# Patient Record
Sex: Male | Born: 1935 | Race: White | Hispanic: No | Smoking: Former smoker
Health system: Southern US, Community
[De-identification: ages and names within clinical notes are randomized; demographics above are authoritative.]

## PROBLEM LIST (undated history)

## (undated) DIAGNOSIS — I219 Acute myocardial infarction, unspecified: Secondary | ICD-10-CM

## (undated) DIAGNOSIS — J449 Chronic obstructive pulmonary disease, unspecified: Secondary | ICD-10-CM

## (undated) DIAGNOSIS — IMO0002 Reserved for concepts with insufficient information to code with codable children: Secondary | ICD-10-CM

## (undated) DIAGNOSIS — C61 Malignant neoplasm of prostate: Principal | ICD-10-CM

## (undated) DIAGNOSIS — Z950 Presence of cardiac pacemaker: Secondary | ICD-10-CM

## (undated) DIAGNOSIS — C801 Malignant (primary) neoplasm, unspecified: Secondary | ICD-10-CM

## (undated) DIAGNOSIS — I714 Abdominal aortic aneurysm, without rupture: Secondary | ICD-10-CM

## (undated) DIAGNOSIS — J189 Pneumonia, unspecified organism: Secondary | ICD-10-CM

## (undated) DIAGNOSIS — N189 Chronic kidney disease, unspecified: Secondary | ICD-10-CM

## (undated) DIAGNOSIS — C4490 Unspecified malignant neoplasm of skin, unspecified: Secondary | ICD-10-CM

## (undated) DIAGNOSIS — E785 Hyperlipidemia, unspecified: Secondary | ICD-10-CM

## (undated) DIAGNOSIS — I10 Essential (primary) hypertension: Secondary | ICD-10-CM

## (undated) DIAGNOSIS — I2510 Atherosclerotic heart disease of native coronary artery without angina pectoris: Secondary | ICD-10-CM

## (undated) DIAGNOSIS — C3490 Malignant neoplasm of unspecified part of unspecified bronchus or lung: Secondary | ICD-10-CM

## (undated) DIAGNOSIS — R0602 Shortness of breath: Secondary | ICD-10-CM

## (undated) DIAGNOSIS — K219 Gastro-esophageal reflux disease without esophagitis: Secondary | ICD-10-CM

## (undated) DIAGNOSIS — K5790 Diverticulosis of intestine, part unspecified, without perforation or abscess without bleeding: Secondary | ICD-10-CM

## (undated) DIAGNOSIS — Z923 Personal history of irradiation: Secondary | ICD-10-CM

## (undated) DIAGNOSIS — K649 Unspecified hemorrhoids: Secondary | ICD-10-CM

## (undated) DIAGNOSIS — I2699 Other pulmonary embolism without acute cor pulmonale: Secondary | ICD-10-CM

## (undated) HISTORY — PX: INSERT / REPLACE / REMOVE PACEMAKER: SUR710

## (undated) HISTORY — PX: CORONARY ANGIOPLASTY: SHX604

## (undated) HISTORY — DX: Malignant (primary) neoplasm, unspecified: C80.1

## (undated) HISTORY — DX: Atherosclerotic heart disease of native coronary artery without angina pectoris: I25.10

## (undated) HISTORY — DX: Diverticulosis of intestine, part unspecified, without perforation or abscess without bleeding: K57.90

## (undated) HISTORY — DX: Reserved for concepts with insufficient information to code with codable children: IMO0002

## (undated) HISTORY — PX: CORONARY STENT PLACEMENT: SHX1402

## (undated) HISTORY — DX: Abdominal aortic aneurysm, without rupture: I71.4

## (undated) HISTORY — DX: Hyperlipidemia, unspecified: E78.5

## (undated) HISTORY — DX: Unspecified hemorrhoids: K64.9

---

## 1971-06-14 HISTORY — PX: ELBOW SURGERY: SHX618

## 1993-06-13 DIAGNOSIS — I219 Acute myocardial infarction, unspecified: Secondary | ICD-10-CM

## 1993-06-13 HISTORY — DX: Acute myocardial infarction, unspecified: I21.9

## 2000-02-26 ENCOUNTER — Emergency Department (HOSPITAL_COMMUNITY): Admission: EM | Admit: 2000-02-26 | Discharge: 2000-02-26 | Payer: Self-pay | Admitting: Emergency Medicine

## 2000-02-26 ENCOUNTER — Encounter: Payer: Self-pay | Admitting: Emergency Medicine

## 2003-06-14 DIAGNOSIS — C61 Malignant neoplasm of prostate: Secondary | ICD-10-CM

## 2003-06-14 HISTORY — DX: Malignant neoplasm of prostate: C61

## 2004-01-23 ENCOUNTER — Ambulatory Visit (HOSPITAL_COMMUNITY): Admission: RE | Admit: 2004-01-23 | Discharge: 2004-01-23 | Payer: Self-pay | Admitting: Urology

## 2004-02-02 ENCOUNTER — Ambulatory Visit: Admission: RE | Admit: 2004-02-02 | Discharge: 2004-05-02 | Payer: Self-pay | Admitting: Radiation Oncology

## 2004-04-02 ENCOUNTER — Encounter: Admission: RE | Admit: 2004-04-02 | Discharge: 2004-04-02 | Payer: Self-pay | Admitting: Urology

## 2004-04-27 ENCOUNTER — Ambulatory Visit: Payer: Self-pay | Admitting: Family Medicine

## 2004-05-04 ENCOUNTER — Ambulatory Visit (HOSPITAL_COMMUNITY): Admission: RE | Admit: 2004-05-04 | Discharge: 2004-05-04 | Payer: Self-pay | Admitting: Urology

## 2004-05-04 ENCOUNTER — Ambulatory Visit (HOSPITAL_BASED_OUTPATIENT_CLINIC_OR_DEPARTMENT_OTHER): Admission: RE | Admit: 2004-05-04 | Discharge: 2004-05-04 | Payer: Self-pay | Admitting: Urology

## 2004-05-04 HISTORY — PX: RADIOACTIVE SEED IMPLANT: SHX5150

## 2004-05-24 ENCOUNTER — Ambulatory Visit: Admission: RE | Admit: 2004-05-24 | Discharge: 2004-08-22 | Payer: Self-pay | Admitting: Radiation Oncology

## 2004-06-13 HISTORY — PX: PTCA: SHX146

## 2005-01-03 ENCOUNTER — Ambulatory Visit: Payer: Self-pay | Admitting: Family Medicine

## 2005-01-11 ENCOUNTER — Ambulatory Visit: Payer: Self-pay | Admitting: Family Medicine

## 2005-01-14 ENCOUNTER — Encounter: Admission: RE | Admit: 2005-01-14 | Discharge: 2005-01-14 | Payer: Self-pay | Admitting: Family Medicine

## 2005-01-27 ENCOUNTER — Ambulatory Visit: Payer: Self-pay | Admitting: Internal Medicine

## 2005-01-28 ENCOUNTER — Ambulatory Visit: Payer: Self-pay | Admitting: Cardiology

## 2005-01-31 ENCOUNTER — Inpatient Hospital Stay (HOSPITAL_BASED_OUTPATIENT_CLINIC_OR_DEPARTMENT_OTHER): Admission: RE | Admit: 2005-01-31 | Discharge: 2005-01-31 | Payer: Self-pay | Admitting: Cardiology

## 2005-01-31 ENCOUNTER — Inpatient Hospital Stay (HOSPITAL_COMMUNITY): Admission: EM | Admit: 2005-01-31 | Discharge: 2005-02-02 | Payer: Self-pay | Admitting: Cardiology

## 2005-02-01 ENCOUNTER — Ambulatory Visit: Payer: Self-pay | Admitting: Cardiology

## 2005-02-22 ENCOUNTER — Ambulatory Visit: Payer: Self-pay | Admitting: *Deleted

## 2005-04-14 ENCOUNTER — Ambulatory Visit: Payer: Self-pay | Admitting: Family Medicine

## 2005-08-12 ENCOUNTER — Ambulatory Visit: Payer: Self-pay | Admitting: Family Medicine

## 2005-09-19 ENCOUNTER — Ambulatory Visit: Payer: Self-pay | Admitting: Family Medicine

## 2005-10-10 ENCOUNTER — Ambulatory Visit: Payer: Self-pay | Admitting: Family Medicine

## 2006-01-16 ENCOUNTER — Ambulatory Visit: Payer: Self-pay | Admitting: Family Medicine

## 2006-01-16 LAB — CONVERTED CEMR LAB: PSA: 0.42 ng/mL

## 2006-01-23 ENCOUNTER — Ambulatory Visit: Payer: Self-pay | Admitting: Family Medicine

## 2006-04-07 ENCOUNTER — Ambulatory Visit: Payer: Self-pay | Admitting: Family Medicine

## 2006-12-25 ENCOUNTER — Encounter: Payer: Self-pay | Admitting: Family Medicine

## 2006-12-25 DIAGNOSIS — H905 Unspecified sensorineural hearing loss: Secondary | ICD-10-CM

## 2006-12-25 DIAGNOSIS — E785 Hyperlipidemia, unspecified: Secondary | ICD-10-CM

## 2006-12-25 DIAGNOSIS — I10 Essential (primary) hypertension: Secondary | ICD-10-CM

## 2006-12-25 DIAGNOSIS — I2510 Atherosclerotic heart disease of native coronary artery without angina pectoris: Secondary | ICD-10-CM

## 2007-01-11 ENCOUNTER — Ambulatory Visit: Payer: Self-pay | Admitting: Family Medicine

## 2007-01-11 LAB — CONVERTED CEMR LAB
BUN: 14 mg/dL (ref 6–23)
Bilirubin, Direct: 0.1 mg/dL (ref 0.0–0.3)
CO2: 28 meq/L (ref 19–32)
Calcium: 9.6 mg/dL (ref 8.4–10.5)
Chloride: 106 meq/L (ref 96–112)
Cholesterol: 146 mg/dL (ref 0–200)
Eosinophils Absolute: 0.3 10*3/uL (ref 0.0–0.6)
HCT: 39.3 % (ref 39.0–52.0)
Hemoglobin: 13.5 g/dL (ref 13.0–17.0)
MCHC: 34.3 g/dL (ref 30.0–36.0)
Monocytes Absolute: 0.5 10*3/uL (ref 0.2–0.7)
Monocytes Relative: 10.9 % (ref 3.0–11.0)
Neutro Abs: 2.2 10*3/uL (ref 1.4–7.7)
PSA: 0.42 ng/mL (ref 0.10–4.00)
Potassium: 4.9 meq/L (ref 3.5–5.1)
RDW: 13 % (ref 11.5–14.6)
Sodium: 140 meq/L (ref 135–145)
TSH: 2.93 microintl units/mL (ref 0.35–5.50)
Total Bilirubin: 1.6 mg/dL — ABNORMAL HIGH (ref 0.3–1.2)
Total Protein: 6.7 g/dL (ref 6.0–8.3)
WBC: 4.3 10*3/uL — ABNORMAL LOW (ref 4.5–10.5)

## 2007-01-18 ENCOUNTER — Ambulatory Visit: Payer: Self-pay | Admitting: Family Medicine

## 2007-01-18 DIAGNOSIS — H900 Conductive hearing loss, bilateral: Secondary | ICD-10-CM

## 2007-05-25 ENCOUNTER — Ambulatory Visit: Payer: Self-pay | Admitting: Family Medicine

## 2007-08-17 ENCOUNTER — Telehealth (INDEPENDENT_AMBULATORY_CARE_PROVIDER_SITE_OTHER): Payer: Self-pay | Admitting: *Deleted

## 2007-09-03 ENCOUNTER — Telehealth: Payer: Self-pay | Admitting: Family Medicine

## 2007-09-03 ENCOUNTER — Ambulatory Visit: Payer: Self-pay | Admitting: Internal Medicine

## 2007-09-18 ENCOUNTER — Encounter: Payer: Self-pay | Admitting: Family Medicine

## 2007-09-18 ENCOUNTER — Ambulatory Visit: Payer: Self-pay | Admitting: Internal Medicine

## 2007-12-31 ENCOUNTER — Encounter: Payer: Self-pay | Admitting: Internal Medicine

## 2007-12-31 ENCOUNTER — Telehealth: Payer: Self-pay | Admitting: Internal Medicine

## 2008-01-30 ENCOUNTER — Encounter: Payer: Self-pay | Admitting: Family Medicine

## 2008-01-30 ENCOUNTER — Ambulatory Visit: Payer: Self-pay | Admitting: Family Medicine

## 2008-02-11 DIAGNOSIS — L570 Actinic keratosis: Secondary | ICD-10-CM | POA: Insufficient documentation

## 2008-02-27 ENCOUNTER — Ambulatory Visit: Payer: Self-pay | Admitting: Family Medicine

## 2008-02-27 LAB — CONVERTED CEMR LAB
ALT: 20 units/L (ref 0–53)
Bilirubin Urine: NEGATIVE
Bilirubin, Direct: 0.2 mg/dL (ref 0.0–0.3)
CO2: 29 meq/L (ref 19–32)
Cholesterol: 143 mg/dL (ref 0–200)
Creatinine, Ser: 1 mg/dL (ref 0.4–1.5)
Eosinophils Absolute: 0.6 10*3/uL (ref 0.0–0.7)
GFR calc non Af Amer: 79 mL/min
Glucose, Bld: 102 mg/dL — ABNORMAL HIGH (ref 70–99)
Glucose, Urine, Semiquant: NEGATIVE
Hemoglobin: 14 g/dL (ref 13.0–17.0)
LDL Cholesterol: 87 mg/dL (ref 0–99)
Lymphocytes Relative: 22 % (ref 12.0–46.0)
MCV: 94.5 fL (ref 78.0–100.0)
Neutrophils Relative %: 58.4 % (ref 43.0–77.0)
Nitrite: NEGATIVE
Platelets: 165 10*3/uL (ref 150–400)
Potassium: 4.1 meq/L (ref 3.5–5.1)
Protein, U semiquant: NEGATIVE
RBC: 4.38 M/uL (ref 4.22–5.81)
RDW: 13.2 % (ref 11.5–14.6)
Sodium: 142 meq/L (ref 135–145)
Specific Gravity, Urine: 1.025
TSH: 2.74 microintl units/mL (ref 0.35–5.50)
Total Bilirubin: 1.3 mg/dL — ABNORMAL HIGH (ref 0.3–1.2)
WBC: 6.1 10*3/uL (ref 4.5–10.5)

## 2008-03-05 ENCOUNTER — Ambulatory Visit: Payer: Self-pay | Admitting: Family Medicine

## 2008-03-05 DIAGNOSIS — Z8546 Personal history of malignant neoplasm of prostate: Secondary | ICD-10-CM

## 2008-03-31 ENCOUNTER — Telehealth: Payer: Self-pay | Admitting: Family Medicine

## 2008-04-13 DIAGNOSIS — I714 Abdominal aortic aneurysm, without rupture, unspecified: Secondary | ICD-10-CM

## 2008-04-13 HISTORY — DX: Abdominal aortic aneurysm, without rupture: I71.4

## 2008-04-13 HISTORY — DX: Abdominal aortic aneurysm, without rupture, unspecified: I71.40

## 2008-05-05 ENCOUNTER — Ambulatory Visit: Payer: Self-pay | Admitting: Family Medicine

## 2008-05-12 ENCOUNTER — Encounter: Admission: RE | Admit: 2008-05-12 | Discharge: 2008-05-12 | Payer: Self-pay | Admitting: Family Medicine

## 2008-05-14 ENCOUNTER — Ambulatory Visit: Payer: Self-pay | Admitting: Family Medicine

## 2008-05-14 DIAGNOSIS — I714 Abdominal aortic aneurysm, without rupture, unspecified: Secondary | ICD-10-CM | POA: Insufficient documentation

## 2008-09-22 ENCOUNTER — Telehealth: Payer: Self-pay | Admitting: Family Medicine

## 2008-09-26 ENCOUNTER — Telehealth: Payer: Self-pay | Admitting: Family Medicine

## 2008-09-30 ENCOUNTER — Telehealth: Payer: Self-pay | Admitting: Family Medicine

## 2009-03-09 ENCOUNTER — Ambulatory Visit: Payer: Self-pay | Admitting: Family Medicine

## 2009-03-09 LAB — CONVERTED CEMR LAB
AST: 20 units/L (ref 0–37)
Basophils Absolute: 0 10*3/uL (ref 0.0–0.1)
Basophils Relative: 0.5 % (ref 0.0–3.0)
Bilirubin Urine: NEGATIVE
Blood in Urine, dipstick: NEGATIVE
CO2: 32 meq/L (ref 19–32)
Calcium: 9.7 mg/dL (ref 8.4–10.5)
Chloride: 107 meq/L (ref 96–112)
Creatinine, Ser: 0.9 mg/dL (ref 0.4–1.5)
Eosinophils Relative: 5.2 % — ABNORMAL HIGH (ref 0.0–5.0)
GFR calc non Af Amer: 88.43 mL/min (ref >60)
Glucose, Bld: 102 mg/dL — ABNORMAL HIGH (ref 70–99)
LDL Cholesterol: 103 mg/dL — ABNORMAL HIGH (ref 0–99)
MCHC: 34.2 g/dL (ref 30.0–36.0)
MCV: 93.7 fL (ref 78.0–100.0)
Monocytes Absolute: 0.5 10*3/uL (ref 0.1–1.0)
Monocytes Relative: 10.7 % (ref 3.0–12.0)
Neutro Abs: 2.7 10*3/uL (ref 1.4–7.7)
Nitrite: NEGATIVE
PSA: 1.14 ng/mL (ref 0.10–4.00)
Platelets: 152 10*3/uL (ref 150.0–400.0)
Potassium: 5.1 meq/L (ref 3.5–5.1)
Protein, U semiquant: NEGATIVE
Specific Gravity, Urine: 1.025
Urobilinogen, UA: 1
pH: 7

## 2009-03-16 ENCOUNTER — Ambulatory Visit: Payer: Self-pay | Admitting: Family Medicine

## 2010-03-19 ENCOUNTER — Ambulatory Visit: Payer: Self-pay | Admitting: Family Medicine

## 2010-03-19 ENCOUNTER — Encounter: Payer: Self-pay | Admitting: Family Medicine

## 2010-03-19 LAB — CONVERTED CEMR LAB
Bilirubin Urine: NEGATIVE
Glucose, Urine, Semiquant: NEGATIVE
Nitrite: NEGATIVE
Protein, U semiquant: NEGATIVE
WBC Urine, dipstick: NEGATIVE

## 2010-03-22 LAB — CONVERTED CEMR LAB
ALT: 23 units/L (ref 0–53)
Alkaline Phosphatase: 66 units/L (ref 39–117)
Basophils Relative: 0.6 % (ref 0.0–3.0)
CO2: 30 meq/L (ref 19–32)
Calcium: 9.3 mg/dL (ref 8.4–10.5)
Chloride: 106 meq/L (ref 96–112)
Creatinine, Ser: 0.8 mg/dL (ref 0.4–1.5)
Eosinophils Absolute: 0.6 10*3/uL (ref 0.0–0.7)
Eosinophils Relative: 13.1 % — ABNORMAL HIGH (ref 0.0–5.0)
GFR calc non Af Amer: 102.49 mL/min (ref >60)
Glucose, Bld: 90 mg/dL (ref 70–99)
HDL: 36.4 mg/dL — ABNORMAL LOW (ref >39.00)
Neutro Abs: 2.1 10*3/uL (ref 1.4–7.7)
Potassium: 4.2 meq/L (ref 3.5–5.1)
RBC: 4.12 M/uL — ABNORMAL LOW (ref 4.22–5.81)
RDW: 14.1 % (ref 11.5–14.6)
TSH: 2.77 microintl units/mL (ref 0.35–5.50)
Total Bilirubin: 1.2 mg/dL (ref 0.3–1.2)
Total CHOL/HDL Ratio: 4
Triglycerides: 85 mg/dL (ref 0.0–149.0)

## 2010-04-01 ENCOUNTER — Encounter: Payer: Self-pay | Admitting: Family Medicine

## 2010-04-02 ENCOUNTER — Encounter: Payer: Self-pay | Admitting: Family Medicine

## 2010-04-02 ENCOUNTER — Ambulatory Visit: Payer: Self-pay

## 2010-04-06 ENCOUNTER — Ambulatory Visit: Payer: Self-pay | Admitting: Cardiology

## 2010-04-06 ENCOUNTER — Encounter: Payer: Self-pay | Admitting: Cardiology

## 2010-04-19 ENCOUNTER — Ambulatory Visit: Payer: Self-pay

## 2010-04-19 ENCOUNTER — Encounter: Payer: Self-pay | Admitting: Cardiology

## 2010-04-19 ENCOUNTER — Ambulatory Visit (HOSPITAL_COMMUNITY): Admission: RE | Admit: 2010-04-19 | Discharge: 2010-04-19 | Payer: Self-pay | Admitting: Cardiology

## 2010-05-09 DIAGNOSIS — J45901 Unspecified asthma with (acute) exacerbation: Secondary | ICD-10-CM

## 2010-05-11 ENCOUNTER — Telehealth: Payer: Self-pay | Admitting: Family Medicine

## 2010-05-20 ENCOUNTER — Ambulatory Visit: Payer: Self-pay | Admitting: Family Medicine

## 2010-06-03 ENCOUNTER — Telehealth: Payer: Self-pay | Admitting: Family Medicine

## 2010-06-04 ENCOUNTER — Ambulatory Visit: Payer: Self-pay | Admitting: Family Medicine

## 2010-06-08 ENCOUNTER — Ambulatory Visit
Admission: RE | Admit: 2010-06-08 | Discharge: 2010-06-08 | Payer: Self-pay | Source: Home / Self Care | Attending: Internal Medicine | Admitting: Internal Medicine

## 2010-06-08 ENCOUNTER — Telehealth: Payer: Self-pay

## 2010-06-29 ENCOUNTER — Ambulatory Visit
Admission: RE | Admit: 2010-06-29 | Discharge: 2010-06-29 | Payer: Self-pay | Source: Home / Self Care | Attending: Family Medicine | Admitting: Family Medicine

## 2010-06-29 ENCOUNTER — Other Ambulatory Visit: Payer: Self-pay | Admitting: Family Medicine

## 2010-06-29 DIAGNOSIS — K810 Acute cholecystitis: Secondary | ICD-10-CM | POA: Insufficient documentation

## 2010-06-29 LAB — BASIC METABOLIC PANEL
BUN: 18 mg/dL (ref 6–23)
CO2: 29 mEq/L (ref 19–32)
Calcium: 9.4 mg/dL (ref 8.4–10.5)
Chloride: 103 mEq/L (ref 96–112)
Creatinine, Ser: 0.9 mg/dL (ref 0.4–1.5)
GFR: 88.1 mL/min (ref >60.00)
Glucose, Bld: 124 mg/dL — ABNORMAL HIGH (ref 70–99)
Potassium: 4.8 mEq/L (ref 3.5–5.1)
Sodium: 139 mEq/L (ref 135–145)

## 2010-06-29 LAB — CBC WITH DIFFERENTIAL/PLATELET
Basophils Absolute: 0 10*3/uL (ref 0.0–0.1)
Basophils Relative: 0.3 % (ref 0.0–3.0)
Eosinophils Absolute: 0.2 10*3/uL (ref 0.0–0.7)
Eosinophils Relative: 1.8 % (ref 0.0–5.0)
HCT: 40.3 % (ref 39.0–52.0)
Hemoglobin: 14 g/dL (ref 13.0–17.0)
Lymphocytes Relative: 12.2 % (ref 12.0–46.0)
Lymphs Abs: 1.2 10*3/uL (ref 0.7–4.0)
MCHC: 34.8 g/dL (ref 30.0–36.0)
MCV: 93.3 fl (ref 78.0–100.0)
Monocytes Absolute: 0.9 10*3/uL (ref 0.1–1.0)
Monocytes Relative: 9.5 % (ref 3.0–12.0)
Neutro Abs: 7.4 10*3/uL (ref 1.4–7.7)
Neutrophils Relative %: 76.2 % (ref 43.0–77.0)
Platelets: 169 10*3/uL (ref 150.0–400.0)
RBC: 4.31 Mil/uL (ref 4.22–5.81)
RDW: 14.3 % (ref 11.5–14.6)
WBC: 9.7 10*3/uL (ref 4.5–10.5)

## 2010-06-29 LAB — HEPATIC FUNCTION PANEL
ALT: 23 U/L (ref 0–53)
AST: 21 U/L (ref 0–37)
Albumin: 4 g/dL (ref 3.5–5.2)
Alkaline Phosphatase: 75 U/L (ref 39–117)
Bilirubin, Direct: 0.3 mg/dL (ref 0.0–0.3)
Total Bilirubin: 1.4 mg/dL — ABNORMAL HIGH (ref 0.3–1.2)
Total Protein: 6.8 g/dL (ref 6.0–8.3)

## 2010-06-29 LAB — AMYLASE: Amylase: 136 U/L — ABNORMAL HIGH (ref 27–131)

## 2010-06-29 LAB — LIPASE: Lipase: 62 U/L — ABNORMAL HIGH (ref 11.0–59.0)

## 2010-07-01 ENCOUNTER — Encounter
Admission: RE | Admit: 2010-07-01 | Discharge: 2010-07-01 | Payer: Self-pay | Source: Home / Self Care | Attending: Surgery | Admitting: Surgery

## 2010-07-06 ENCOUNTER — Other Ambulatory Visit (HOSPITAL_COMMUNITY): Payer: Self-pay | Admitting: Surgery

## 2010-07-06 DIAGNOSIS — R1011 Right upper quadrant pain: Secondary | ICD-10-CM

## 2010-07-06 DIAGNOSIS — R112 Nausea with vomiting, unspecified: Secondary | ICD-10-CM

## 2010-07-06 DIAGNOSIS — K8020 Calculus of gallbladder without cholecystitis without obstruction: Secondary | ICD-10-CM

## 2010-07-13 NOTE — Assessment & Plan Note (Signed)
Summary: emp/pt fasting/cjr   Vital Signs:  Patient profile:   75 year old male Height:      70.75 inches Weight:      218 pounds BMI:     30.73 Temp:     97.9 degrees F oral BP sitting:   160 / 92  (right arm) Cuff size:   regular  Vitals Entered By: Kathrynn Speed CMA (March 19, 2010 8:24 AM) CC: cpx, fasting, src Is Patient Diabetic? No   CC:  cpx, fasting, and src.  History of Present Illness: Corrin is a 75 year old male, nonsmoker single, who comes in today for a Medicare wellness exam.  Because coronary disease, hypertension, hyperlipidemia, and history of prostate cancer......... surgical removal 5 years ago., continues to be asymptomatic....... hearing loss, and a history of a 3.5-cm distal abdominal aortic aneurysm.  He states he feels well.  No complaints except his exercise tolerance is not as good as he once a day.  His friends tell them there.  All taking testosterone and he would like some testosterone shots also explained to him because his history of prostate cancer.  He should not take testosterone injections.  Also, because of his history of coronary disease.  I think it would be worthwhile to have a cardiac evaluation.  This year, although he has no chest pain or shortness of breath with exertion.  He takes Zestoretic 20 to 12.5 daily BP at home 125/70.  He takes Zocor 80 mg nightly along with aspirin tablet.  Would check lipids today.  He stopped his Plavix last year and is taken an aspirin tablet daily.  He gets routine eye care, dental plates, colonoscopy, and GI, tetanus, 2005, Pneumovax 2010, shingles 2009. Here for Medicare AWV:  1.   Risk factors based on Past M, S, F history:.Marland KitchenMarland KitchenMarland Kitchenreviewed.  No changes 2.   Physical Activities: walks daily plays golf 3 times a week 3.   Depression/mood: good mood.  No depression 4.   Hearing: significant hearing loss, declines evaluation 5.   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   Fall Risk: we did  none identified 7.   Home Safety: reviewed.  No guns in the home 8.   Height, weight, &visual acuity: 9.   Counseling: height weight, normal.  Vision normaladvised to continue current treatment program 10.   Labs ordered based on risk factors: done today 11.           Referral Coordination...cardiac evaluation because of decreased exercise tolerance 12.           Care Plan....reviewed all medications 13.            Cognitive Assessment .Marland Kitchenoriented x 3   Preventive Screening-Counseling & Management  Alcohol-Tobacco     Smoking Status: quit     Year Quit: 95'[     Passive Smoke Exposure: no  Current Medications (verified): 1)  Plavix 75 Mg  Tabs (Clopidogrel Bisulfate) .[name].. 1 Qam 2)  Nitrostat 0.4 Mg  Subl (Nitroglycerin) .... As Needed 3)  Mvi 4)  Fish Oil 5)  Zestoretic 20-12.5 Mg Tabs (Lisinopril-Hydrochlorothiazide) .... Take 1 Tablet By Mouth Every Morning 6)  Viagra 100 Mg Tabs (Sildenafil Citrate) .... Take One Hour Prior To Intercourse 7)  Zocor 80 Mg Tabs (Simvastatin) .[name].. 1 Tab @ Bedtime  Allergies (verified): No Known Drug Allergies  Past History:  Past medical, surgical, family and social histories (including risk factors) reviewed, and no changes noted (except as noted below).  Past Medical History: Reviewed history from 03/05/2008 and no changes required. Coronary artery disease Hyperlipidemia Hypertension HEARING LOSS Prostate cancer, hx of  Past Surgical History: Reviewed history from 12/25/2006 and no changes required. PROSTATE CANCER-05' PTCA/stent-06'  Family History: Reviewed history and no changes required.  Social History: Reviewed history from 03/05/2008 and no changes required. Retired Former Smoker Alcohol use-no Drug use-no Regular exercise-yes  Review of Systems      See HPI          Physical Exam  General:  Well-developed,well-nourished,in no acute distress; alert,appropriate and cooperative throughout examination Head:   Normocephalic and atraumatic without obvious abnormalities. No apparent alopecia or balding. Eyes:  No corneal or conjunctival inflammation noted. EOMI. Perrla. Funduscopic exam benign, without hemorrhages, exudates or papilledema. Vision grossly normal. Ears:  External ear exam shows no significant lesions or deformities.  Otoscopic examination reveals clear canals, tympanic membranes are intact bilaterally without bulging, retraction, inflammation or discharge. Hearing is grossly normal bilaterally. Nose:  External nasal examination shows no deformity or inflammation. Nasal mucosa are pink and moist without lesions or exudates. Mouth:  Oral mucosa and oropharynx without lesions or exudates.  Teeth in good repair. Neck:  No deformities, masses, or tenderness noted. Chest Wall:  No deformities, masses, tenderness or gynecomastia noted. Breasts:  No masses or gynecomastia noted Lungs:  Normal respiratory effort, chest expands symmetrically. Lungs are clear to auscultation, no crackles or wheezes. Heart:  Normal rate and regular rhythm. S1 and S2 normal without gallop, murmur, click, rub or other extra sounds. Abdomen:  Bowel sounds positive,abdomen soft and non-tender without masses, organomegaly or hernias noted..........no audible bruit Rectal:  No external abnormalities noted. Normal sphincter tone. No rectal masses or tenderness. Genitalia:  Testes bilaterally descended without nodularity, tenderness or masses. No scrotal masses or lesions. No penis lesions or urethral discharge. Prostate:  surgically removed Msk:  No deformity or scoliosis noted of thoracic or lumbar spine.   Pulses:  R and L carotid,radial,femoral,dorsalis pedis and posterior tibial pulses are full and equal bilaterally Extremities:  No clubbing, cyanosis, edema, or deformity noted with normal full range of motion of all joints.   Neurologic:  No cranial nerve deficits noted. Station and gait are normal. Plantar reflexes are  down-going bilaterally. DTRs are symmetrical throughout. Sensory, motor and coordinative functions appear intact. Skin:  Intact without suspicious lesions or rashes Cervical Nodes:  No lymphadenopathy noted Axillary Nodes:  No palpable lymphadenopathy Inguinal Nodes:  No significant adenopathy Psych:  Cognition and judgment appear intact. Alert and cooperative with normal attention span and concentration. No apparent delusions, illusions, hallucinations   Impression & Recommendations:  Problem # 1:  ABDOMINAL AORTIC ANEURYSM (ICD-441.4) Assessment Unchanged  Orders: EKG w/ Interpretation (93000) Venipuncture (06301) Prescription Created Electronically [phone]) Medicare -1st Annual Wellness Visit [phone]) Urinalysis-dipstick only (Medicare patient) (73220UR) Specimen Handling (42706) Radiology Referral (Radiology) TLB-Lipid Panel (80061-LIPID) TLB-BMP (Basic Metabolic Panel-BMET) (80048-METABOL) TLB-CBC Platelet - w/Differential (85025-CBCD) TLB-Hepatic/Liver Function Pnl (80076-HEPATIC) TLB-TSH (Thyroid Stimulating Hormone) (84443-TSH) TLB-PSA (Prostate Specific Antigen) (84153-PSA)  Problem # 2:  PROSTATE CANCER, HX OF (ICD-V10.46) Assessment: Improved  Orders: Venipuncture (23762) Prescription Created Electronically [phone]) Medicare -1st Annual Wellness Visit [phone]) Urinalysis-dipstick only (Medicare patient) (73710GY) TLB-Lipid Panel (80061-LIPID) TLB-BMP (Basic Metabolic Panel-BMET) (80048-METABOL) TLB-CBC Platelet - w/Differential (85025-CBCD) TLB-Hepatic/Liver Function Pnl (80076-HEPATIC) TLB-TSH (Thyroid Stimulating Hormone) (84443-TSH) TLB-PSA (Prostate Specific Antigen) (84153-PSA)  Problem # 3:  LOSS, CONDUCTIVE HEARING, BILATERAL (ICD-389.06) Assessment: Deteriorated  Orders: Venipuncture (69485) Prescription Created Electronically [phone]) Medicare -1st Annual Wellness  Visit [phone]) Urinalysis-dipstick only (Medicare patient) (86761PJ) Specimen Handling  (99000) TLB-Lipid Panel (80061-LIPID) TLB-BMP (Basic Metabolic Panel-BMET) (80048-METABOL) TLB-CBC Platelet - w/Differential (85025-CBCD) TLB-Hepatic/Liver Function Pnl (80076-HEPATIC) TLB-TSH (Thyroid Stimulating Hormone) (84443-TSH) TLB-PSA (Prostate Specific Antigen) (84153-PSA)  Problem # 4:  HYPERTENSION (ICD-401.9) Assessment: Improved  His updated medication list for this problem includes:    Zestoretic 20-12.5 Mg Tabs (Lisinopril-hydrochlorothiazide) .[name]... Take 1 tablet by mouth every morning  Orders: EKG w/ Interpretation (93000)  His updated medication list for this problem includes:    Zestoretic 20-12.5 Mg Tabs (Lisinopril-hydrochlorothiazide) .[name]... Take 1 tablet by mouth every morning  Problem # 5:  HYPERLIPIDEMIA (ICD-272.4) Assessment: Improved  His updated medication list for this problem includes:    Zocor 80 Mg Tabs (Simvastatin) .[name]... 1 tab @ bedtime  Orders: Venipuncture (09326) Prescription Created Electronically [phone]) Medicare -1st Annual Wellness Visit [phone]) Urinalysis-dipstick only (Medicare patient) (33825KN) Specimen Handling (39767) TLB-Lipid Panel (80061-LIPID) TLB-BMP (Basic Metabolic Panel-BMET) (80048-METABOL) TLB-CBC Platelet - w/Differential (85025-CBCD) TLB-Hepatic/Liver Function Pnl (80076-HEPATIC) TLB-TSH (Thyroid Stimulating Hormone) (84443-TSH) TLB-PSA (Prostate Specific Antigen) (84153-PSA)  Problem # 6:  CORONARY ARTERY DISEASE (ICD-414.00) Assessment: Unchanged  The following medications were removed from the medication list:    Plavix 75 Mg Tabs (Clopidogrel bisulfate) .[name]... 1 qam His updated medication list for this problem includes:    Nitrostat 0.4 Mg Subl (Nitroglycerin) .[name]... As needed    Zestoretic 20-12.5 Mg Tabs (Lisinopril-hydrochlorothiazide) .[name]... Take 1 tablet by mouth every morning  Orders: EKG w/ Interpretation (93000)  His updated medication list for this problem includes:    Plavix 75 Mg Tabs  (Clopidogrel bisulfate) .[name]... 1 qam    Nitrostat 0.4 Mg Subl (Nitroglycerin) .[name]... As needed    Zestoretic 20-12.5 Mg Tabs (Lisinopril-hydrochlorothiazide) .[name]... Take 1 tablet by mouth every morning  Complete Medication List: 1)  Nitrostat 0.4 Mg Subl (Nitroglycerin) .... As needed 2)  Mvi  3)  Fish Oil  4)  Zestoretic 20-12.5 Mg Tabs (Lisinopril-hydrochlorothiazide) .... Take 1 tablet by mouth every morning 5)  Zocor 80 Mg Tabs (Simvastatin) .[name].. 1 tab @ bedtime  Other Orders: Cardiology Referral (Cardiology)  Patient Instructions: 1)  we will get to set up for cardiac evaluation. 2)  Because of your history of prostate cancer.  I would not recommend he take any testosterone supplements. 3)  We will also get to set up for an ultrasound of your to check the aneurysm 4)  Please schedule a follow-up appointment in 1 year. 5)  It is important that you exercise regularly at least 20 minutes 5 times a week. If you develop chest pain, have severe difficulty breathing, or feel very tired , stop exercising immediately and seek medical attention. 6)  Take an Aspirin every day. Prescriptions: ZOCOR 80 MG TABS (SIMVASTATIN) 1 tab @ bedtime  #100 x 3   Entered and Authorized by:   Roderick Pee MD   Signed by:   Roderick Pee MD on 03/19/2010   Method used:   Faxed to ...       [facility]order)             , [place]         Ph:        Fax: [phone]   RxID:   [id number] ZESTORETIC 20-12.5 MG TABS (LISINOPRIL-HYDROCHLOROTHIAZIDE) Take 1 tablet by mouth every morning  #100 x 3   Entered and Authorized by:   Roderick Pee MD   Signed by:   Tinnie Gens  Shawnie Dapper MD on 03/19/2010   Method used:   Faxed to ...       [facility] (mail[facility]order)             , [place]         Ph:        Fax: [phone]   RxID:   [id number] NITROSTAT 0.4 MG  SUBL (NITROGLYCERIN) as needed  #25 x 1   Entered and Authorized by:   Roderick Pee  MD   Signed by:   Roderick Pee MD on 03/19/2010   Method used:   Faxed to ...       [facility]mail[facility]order)             , [place]         Ph:        Fax: [phone]   RxID:   [id number]      Laboratory Results   Urine Tests    Routine Urinalysis   Color: yellow Appearance: Clear Glucose: negative   (Normal Range: Negative) Bilirubin: negative   (Normal Range: Negative) Ketone: negative   (Normal Range: Negative) Spec. Gravity: 1.020   (Normal Range: 1.003-1.035) Blood: negative   (Normal Range: Negative) pH: 6.5   (Normal Range: 5.0-8.0) Protein: negative   (Normal Range: Negative) Urobilinogen: 0.2   (Normal Range: 0-1) Nitrite: negative   (Normal Range: Negative) Leukocyte Esterace: negative   (Normal Range: Negative)    Comments: Rita Ohara  March 19, 2010 10:56 AM

## 2010-07-13 NOTE — Progress Notes (Signed)
Summary: inhaler  Phone Note Call from Patient Call back at Home Phone [phone]   Caller: Patient Call For: Roderick Pee MD Reason for Call: Acute Illness Action Taken: Auriella Wieand Notified Summary of Call: Cannot come in and is having asthma symptoms.......Marland Kitchenwould like an inhaler.  SOB and wheezing. Speech is normal. Initial call taken by: [facility] CMA AAMA,  May 11, 2010 4:19 PM  Follow-up for Phone Call        he needs to be seen..........[name] because of his medical history I do not feel comfortable calling in and medication without examining him.  He can go to come in urgent care tonight Follow-up by: Roderick Pee MD,  May 11, 2010 5:32 PM  Additional Follow-up for Phone Call Additional follow up Details #1::        Phone Call Completed Additional Follow-up by: Kern Reap CMA Duncan Dull),  May 11, 2010 5:43 PM

## 2010-07-13 NOTE — Assessment & Plan Note (Signed)
Summary: ec6/last seen in 06 by hochrein/hx of CAD/jml  Medications Added NITROSTAT 0.4 MG SUBL (NITROGLYCERIN) 1 tablet under tongue at onset of chest pain; you may repeat every 5 minutes for up to 3 doses. MULTIVITAMINS   TABS (MULTIPLE VITAMIN) 1 tab once daily FISH OIL 1000 MG CAPS (OMEGA-3 FATTY ACIDS) 1 cap two times a day PLAVIX 75 MG TABS (CLOPIDOGREL BISULFATE) one daily      Allergies Added: NKDA  Visit Type:  EC6 Primary Provider:  Dr. Tawanna Cooler  CC:  no cardiac complaints today.  History of Present Illness: 75 yo with history of CAD s/p PCI to  3 vessels in 2006.  Interestingly, patient had no antecedent He has not followed with cardiology since that time.  He has been off Plavix for over a year now. He  has been doing quite well symptomatically.  He walks 1.5-2 miles daily 6 days a week and plays golf. No chest pain or exertional dyspnea.  He is on Zocor 80 mg daily but has taken this dose for > 1 year and has had no side effects.    ECG: NSR, normal  Labs (10/11): LDL 75, HDL 36, TSH normal, LFTs normal, K 4.2, creatinine 0.8  Current Medications (verified): 1)  Nitrostat 0.4 Mg Subl (Nitroglycerin) .[name].. 1 Tablet Under Tongue At Onset of Chest Pain; You May Repeat Every 5 Minutes For Up To 3 Doses. 2)  Multivitamins   Tabs (Multiple Vitamin) .[name].. 1 Tab Once Daily 3)  Fish Oil 1000 Mg Caps (Omega-3 Fatty Acids) .[name].. 1 Cap Two Times A Day 4)  Zestoretic 20-12.5 Mg Tabs (Lisinopril-Hydrochlorothiazide) .... Take 1 Tablet By Mouth Every Morning 5)  Zocor 80 Mg Tabs (Simvastatin) .[name].. 1 Tab @ Bedtime  Allergies (verified): No Known Drug Allergies  Past History:  Past Medical History: 1. Coronary artery disease: Positive stress test in 2006 led to left heart cath (8/06) showing 95% prox RCA, 90% CFX, and 90% mLAD.  Patient had a Cypher DES to all 3 lesions.  2. Hyperlipidemia 3. Hypertension 4. HEARING LOSS 5. Prostate cancer, hx of: tx with prostatectomy 6. AAA: 3.2 cm  in 11/09  Family History: No premature CAD.   Social History: Reviewed history from 03/05/2008 and no changes required. Retired Former Smoker Alcohol use-no Drug use-no Regular exercise-yes  Review of Systems       All systems reviewed and negative except as per HPI.   Vital Signs:  Patient profile:   75 year old male Height:      70.75 inches Weight:      217.8 pounds BMI:     30.70 Pulse rate:   61 / minute Pulse rhythm:   regular BP sitting:   102 / 60  (left arm) Cuff size:   large  Vitals Entered By: Danielle Rankin, CMA (April 06, 2010 12:12 PM)  Physical Exam  General:  Well developed, well nourished, in no acute distress. Neck:  Neck supple, no JVD. No masses, thyromegaly or abnormal cervical nodes. Lungs:  Clear bilaterally to auscultation and percussion. Heart:  Non-displaced PMI, chest non-tender; regular rate and rhythm, S1, S2 without murmurs, rubs or gallops. Carotid upstroke normal, no bruit. Pedals normal pulses. No edema, no varicosities. Abdomen:  Bowel sounds positive; abdomen soft and non-tender without masses, organomegaly, or hernias noted. No hepatosplenomegaly. Extremities:  No clubbing or cyanosis. Neurologic:  Alert and oriented x 3. Skin:  Intact without lesions or rashes. Psych:  Normal affect.   Impression & Recommendations:  Problem # 1:  CORONARY ARTERY DISEASE (ICD-414.00) Stable with no ischemic symptoms.  Patient asks today about whether he ought to be on Plavix.  I think that given his 3 drug-eluting stents, it would be reasonable restart it.  He has had no bleeding complications.  I will get an echo to assess LV systolic and diastolic function.  Continue ASA, statin, lisinopril.   Problem # 2:  ABDOMINAL AORTIC ANEURYSM (ICD-441.4) Patient had followup ultrasound of abdomen on Friday, report not available.   Problem # 3:  HYPERLIPIDEMIA (ICD-272.4) Keep LDL < 70 given CAD.   Other Orders: Echocardiogram (Echo)  Patient  Instructions: 1)  Your physician has recommended you make the following change in your medication:  2)  Start Plavix 75mg  daily. 3)  Your physician has requested that you have an echocardiogram.  Echocardiography is a painless test that uses sound waves to create images of your heart. It provides your doctor with information about the size and shape of your heart and how well your heart's chambers and valves are working.  This procedure takes approximately one hour. There are no restrictions for this procedure. 4)  Your physician wants you to follow-up in: 1 year with Dr Shirlee Latch.  You will receive a reminder letter in the mail two months in advance. If you don't receive a letter, please call our office to schedule the follow-up appointment. Prescriptions: PLAVIX 75 MG TABS (CLOPIDOGREL BISULFATE) one daily  #90 x 3   Entered by:   Katina Dung, RN, BSN   Authorized by:   Marca Ancona, MD   Signed by:   Katina Dung, RN, BSN on 04/06/2010   Method used:   Print then Give to Patient   RxID:   [id number]

## 2010-07-13 NOTE — Assessment & Plan Note (Signed)
Summary: [place] // RS   Vital Signs:  Patient profile:   75 year old male Height:      70.75 inches Weight:      223 pounds O2 Sat:      93 % on Room air Temp:     97.7 degrees F oral Pulse rate:   80 / minute BP sitting:   164 / 82  (left arm) Cuff size:   regular  Vitals Entered By: Kathlene November LPN (May 20, 2010 11:58 AM)  O2 Flow:  Room air CC: cough first thing in the morning and at bedtime. back sore from coughing so hard. Symproms for 2 weeks   Primary Care Provider:  Dr. Tawanna Cooler  CC:  cough first thing in the morning and at bedtime. back sore from coughing so hard. Symproms for 2 weeks.  History of Present Illness: Nicholas Schmitt is a 75 year old very very, very hard of hearing male, who comes in today for a two week history of cough.  He said no fever, earache, sore throat, etc.  He states he feels tight in the chest.  In the, past.  He's had intermittent spells like this usually in the fall.  He is a nonsmoker.  Pulmonary review of systems negative  Current Medications (verified): 1)  Nitrostat 0.4 Mg Subl (Nitroglycerin) .[name].. 1 Tablet Under Tongue At Onset of Chest Pain; You May Repeat Every 5 Minutes For Up To 3 Doses. 2)  Multivitamins   Tabs (Multiple Vitamin) .[name].. 1 Tab Once Daily 3)  Fish Oil 1000 Mg Caps (Omega-3 Fatty Acids) .[name].. 1 Cap Two Times A Day 4)  Zestoretic 20-12.5 Mg Tabs (Lisinopril-Hydrochlorothiazide) .... Take 1 Tablet By Mouth Every Morning 5)  Zocor 80 Mg Tabs (Simvastatin) .[name].. 1 Tab @ Bedtime 6)  Plavix 75 Mg Tabs (Clopidogrel Bisulfate) .... One Daily  Allergies (verified): No Known Drug Allergies  Comments:  Nurse/Medical Assistant: The patient's medications and allergies were reviewed with the patient and were updated in the Medication and Allergy Lists. Kathlene November LPN (May 20, 2010 12:00 PM)  Past History:  Past medical, surgical, family and social histories (including risk factors) reviewed for relevance to current acute  and chronic problems.  Past Medical History: Reviewed history from 04/06/2010 and no changes required. 1. Coronary artery disease: Positive stress test in 2006 led to left heart cath (8/06) showing 95% prox RCA, 90% CFX, and 90% mLAD.  Patient had a Cypher DES to all 3 lesions.  2. Hyperlipidemia 3. Hypertension 4. HEARING LOSS 5. Prostate cancer, hx of: tx with prostatectomy 6. AAA: 3.2 cm in 11/09  Past Surgical History: Reviewed history from 12/25/2006 and no changes required. PROSTATE CANCER-05' PTCA/stent-06'  Family History: Reviewed history from 04/06/2010 and no changes required. No premature CAD.   Social History: Reviewed history from 03/05/2008 and no changes required. Retired Former Smoker Alcohol use-no Drug use-no Regular exercise-yes  Review of Systems      See HPI  Physical Exam  General:  Well-developed,well-nourished,in no acute distress; alert,appropriate and cooperative throughout examination Head:  Normocephalic and atraumatic without obvious abnormalities. No apparent alopecia or balding. Eyes:  No corneal or conjunctival inflammation noted. EOMI. Perrla. Funduscopic exam benign, without hemorrhages, exudates or papilledema. Vision grossly normal. Ears:  External ear exam shows no significant lesions or deformities.  Otoscopic examination reveals clear canals, tympanic membranes are intact bilaterally without bulging, retraction, inflammation or discharge. Hearing is grossly normal bilaterally. Nose:  External nasal examination shows no deformity or  inflammation. Nasal mucosa are pink and moist without lesions or exudates. Mouth:  Oral mucosa and oropharynx without lesions or exudates.  Teeth in good repair. Neck:  No deformities, masses, or tenderness noted. Chest Wall:  No deformities, masses, tenderness or gynecomastia noted. Lungs:  symmetrical breath sounds, late expiratory wheezing   Problems:  Medical Problems Added: 1)  Dx of Asthma, Acute   (MWN-027.25)  Impression & Recommendations:  Problem # 1:  ASTHMA, ACUTE (DGU-440.34) Assessment New  His updated medication list for this problem includes:    Prednisone 20 Mg Tabs (Prednisone) ..... Uad  Orders: Prescription Created Electronically [phone])  Complete Medication List: 1)  Nitrostat 0.4 Mg Subl (Nitroglycerin) .[name].. 1 tablet under tongue at onset of chest pain; you may repeat every 5 minutes for up to 3 doses. 2)  Multivitamins Tabs (Multiple vitamin) .[name].. 1 tab once daily 3)  Fish Oil 1000 Mg Caps (Omega-3 fatty acids) .[name].. 1 cap two times a day 4)  Zestoretic 20-12.5 Mg Tabs (Lisinopril-hydrochlorothiazide) .... Take 1 tablet by mouth every morning 5)  Zocor 80 Mg Tabs (Simvastatin) .[name].. 1 tab @ bedtime 6)  Plavix 75 Mg Tabs (Clopidogrel bisulfate) .... One daily 7)  Prednisone 20 Mg Tabs (Prednisone) .... Uad  Patient Instructions: 1)  begin prednisone, take two tablets daily for 3 days, one for 3 days, a half for 3 days, then half a tablet every other day for two weeks.  Return p.r.n. Prescriptions: PREDNISONE 20 MG TABS (PREDNISONE) UAD  #30 x 0   Entered and Authorized by:   Roderick Pee MD   Signed by:   Roderick Pee MD on 05/20/2010   Method used:   Printed then faxed to ...       [facility] ([facility]order)             , [place]         Ph:        Fax: [phone]   RxID:   [id number]    Orders Added: 1)  Prescription Created Electronically [[id number]] 2)  Est. Patient Level III [01093]

## 2010-07-13 NOTE — Miscellaneous (Signed)
Summary: Orders Update  Clinical Lists Changes  Orders: Added new Test order of Abdominal Aorta Duplex (Abd Aorta Duplex) - Signed 

## 2010-07-14 ENCOUNTER — Encounter: Payer: Self-pay | Admitting: Surgery

## 2010-07-15 NOTE — Assessment & Plan Note (Signed)
Summary: sob/njr   Vital Signs:  Patient profile:   75 year old male Weight:      225 pounds O2 Sat:      94 % Pulse rate:   61 / minute BP sitting:   140 / 92  Vitals Entered By: Kyung Rudd, CMA (June 08, 2010 10:34 AM) CC: SOB   Primary Care Provider:  Dr. Tawanna Cooler  CC:  SOB.  History of Present Illness: Patient presents to clinic as a workin for evaluation of cough and wheezing. Notes  2wk h/o cough productive for white sputum without hemoptysis. +DOE and subjective wheezing worse at night. Denies orthopnea or increase in LE edema. Has prednisone at home and recently began a tapering course. Has taken no other medication. No alleviating or exacerbating factors. Chart review indicates h/o asthma and CAD.  Current Medications (verified): 1)  Nitrostat 0.4 Mg Subl (Nitroglycerin) .[name].. 1 Tablet Under Tongue At Onset of Chest Pain; You May Repeat Every 5 Minutes For Up To 3 Doses. 2)  Multivitamins   Tabs (Multiple Vitamin) .[name].. 1 Tab Once Daily 3)  Fish Oil 1000 Mg Caps (Omega-3 Fatty Acids) .[name].. 1 Cap Two Times A Day 4)  Zestoretic 20-12.5 Mg Tabs (Lisinopril-Hydrochlorothiazide) .... Take 1 Tablet By Mouth Every Morning 5)  Zocor 80 Mg Tabs (Simvastatin) .[name].. 1 Tab @ Bedtime 6)  Plavix 75 Mg Tabs (Clopidogrel Bisulfate) .... One Daily 7)  Prednisone 20 Mg Tabs (Prednisone) .... Uad  Allergies (verified): No Known Drug Allergies  Past History:  Past medical, surgical, family and social histories (including risk factors) reviewed, and no changes noted (except as noted below).  Past Medical History: Reviewed history from 04/06/2010 and no changes required. 1. Coronary artery disease: Positive stress test in 2006 led to left heart cath (8/06) showing 95% prox RCA, 90% CFX, and 90% mLAD.  Patient had a Cypher DES to all 3 lesions.  2. Hyperlipidemia 3. Hypertension 4. HEARING LOSS 5. Prostate cancer, hx of: tx with prostatectomy 6. AAA: 3.2 cm in 11/09  Past Surgical  History: Reviewed history from 12/25/2006 and no changes required. PROSTATE CANCER-05' PTCA/stent-06'  Family History: Reviewed history from 04/06/2010 and no changes required. No premature CAD.   Social History: Reviewed history from 03/05/2008 and no changes required. Retired Former Smoker Alcohol use-no Drug use-no Regular exercise-yes  Review of Systems      See HPI General:  Denies chills, fever, and sweats. ENT:  Denies hoarseness, nasal congestion, and sore throat. CV:  Complains of shortness of breath with exertion; denies chest pain or discomfort, difficulty breathing at night, difficulty breathing while lying down, fainting, and swelling of feet. Resp:  Complains of cough, shortness of breath, sputum productive, and wheezing; denies chest discomfort and coughing up blood. Derm:  Denies changes in color of skin, changes in nail beds, and rash.  Physical Exam  General:  Well-developed,well-nourished,in no acute distress; alert,appropriate and cooperative throughout examination Head:  Normocephalic and atraumatic without obvious abnormalities. No apparent alopecia or balding. Eyes:  pupils equal, pupils round, corneas and lenses clear, and no injection.   Ears:  R ear normal, L ear normal, and no external deformities.   Nose:  External nasal examination shows no deformity or inflammation. Nasal mucosa are pink and moist without lesions or exudates. Mouth:  Oral mucosa and oropharynx without lesions or exudates.  Teeth in good repair. Lungs:  Normal respiratory effort, chest expands symmetrically. Lungs are clear to auscultation, no crackles or wheezes. Heart:  Normal  rate and regular rhythm. S1 and S2 normal without gallop, murmur, click, rub or other extra sounds.no JVD.   Extremities:  trace left pedal edema and trace right pedal edema.   Neurologic:  alert & oriented X3.     Impression & Recommendations:  Problem # 1:  ASTHMA, ACUTE (ICD-493.92) Assessment  Deteriorated No evidence of respiratory distress. No clinical evidence to suggest fluid overload. Begin prednisone taper and abx tx. Depomedrol injxn given today. Albuterol neb x 1 administered and albuterol mdi as needed prescribed. Obtain CXR. Arrange for close followup.  His updated medication list for this problem includes:    Prednisone 20 Mg Tabs (Prednisone) ..... Uad    Proventil Hfa 108 (90 Base) Mcg/act Aers (Albuterol sulfate) .[name]... 2 puffs q4-6 hours as needed wheezing  Orders: Depo- Medrol 80mg  (J1040) CXR- 2view (CXR) Albuterol inh. 1mg  Albuterol or .5 Levalbuterol (B1478)  Complete Medication List: 1)  Nitrostat 0.4 Mg Subl (Nitroglycerin) .[name].. 1 tablet under tongue at onset of chest pain; you may repeat every 5 minutes for up to 3 doses. 2)  Multivitamins Tabs (Multiple vitamin) .[name].. 1 tab once daily 3)  Fish Oil 1000 Mg Caps (Omega-3 fatty acids) .[name].. 1 cap two times a day 4)  Zestoretic 20-12.5 Mg Tabs (Lisinopril-hydrochlorothiazide) .... Take 1 tablet by mouth every morning 5)  Zocor 80 Mg Tabs (Simvastatin) .[name].. 1 tab @ bedtime 6)  Plavix 75 Mg Tabs (Clopidogrel bisulfate) .... One daily 7)  Prednisone 20 Mg Tabs (Prednisone) .... Uad 8)  Proventil Hfa 108 (90 Base) Mcg/act Aers (Albuterol sulfate) .... 2 puffs q4-6 hours as needed wheezing 9)  Doxycycline Hyclate 100 Mg Tabs (Doxycycline hyclate) .... One by mouth bid  Patient Instructions: 1)  Followup with Dr. Rodena Medin on Friday. 2)  Please get your chest xray today. 3)  Take your prednisone as follows: 4)  2 tablets a day for 3 days 5)  1 tablet a day for 3 days  6)  1/2 tablet a day for 3 days then stop Prescriptions: DOXYCYCLINE HYCLATE 100 MG TABS (DOXYCYCLINE HYCLATE) one by mouth bid  #14 x 0   Entered and Authorized by:   Edwyna Perfect MD   Signed by:   Edwyna Perfect MD on 06/08/2010   Method used:   Print then Give to Patient   RxID:   [id number] PROVENTIL HFA 108 (90 BASE) MCG/ACT AERS  (ALBUTEROL SULFATE) 2 puffs q4-6 hours as needed wheezing  #1 x 1   Entered and Authorized by:   Edwyna Perfect MD   Signed by:   Edwyna Perfect MD on 06/08/2010   Method used:   Print then Give to Patient   RxID:   [id number]    Medication Administration  Injection # 1:    Medication: Depo- Medrol 80mg     Diagnosis: ASTHMA, ACUTE (VOZ-366.44)    Route: IM    Site: LUOQ gluteus    Exp Date: 12/2012    Lot #: [id number]    Mfr: [employer]    Patient tolerated injection without complications    Given by: Kyung Rudd, CMA (June 08, 2010 11:03 AM)  Medication # 1:    Medication: ASA 325mg  tab    Diagnosis: ASTHMA, ACUTE (ICD-493.92)  Orders Added: 1)  Depo- Medrol 80mg  [J1040] 2)  CXR- 2view [CXR] 3)  Albuterol inh. 1mg  Albuterol or .5 Levalbuterol [J7603] 4)  Est. Patient Level IV [03474]

## 2010-07-15 NOTE — Progress Notes (Signed)
  Phone Note Call from Patient Call back at Home Phone [phone]   Caller: Patient Call For: Nicholas Pee MD Summary of Call: [facility]) Pt is using Primatine 6x daily and needs RX inhaler, please. Initial call taken by: Lynann Beaver CMA AAMA,  June 03, 2010 1:56 PM  Follow-up for Phone Call        if he's having to use his inhaler that frequently he needs to come to the office at 815 in the morning for evaluation or if he can't breathe I would recommend he go to the emergency room tonight Follow-up by: Nicholas Pee MD,  June 03, 2010 2:50 PM  Additional Follow-up for Phone Call Additional follow up Details #1::        Pt will come in at 8 :15 per Dr. Tawanna Cooler tomorrow. Additional Follow-up by: Lynann Beaver CMA AAMA,  June 03, 2010 3:00 PM

## 2010-07-15 NOTE — Assessment & Plan Note (Signed)
Summary: gi issues//ccm   Vital Signs:  Patient profile:   75 year old male Weight:      219 pounds O2 Sat:      96 % on Room air Temp:     98.1 degrees F oral Pulse rate:   90 / minute BP sitting:   140 / 90  (left arm) Cuff size:   regular  Vitals Entered By: Kern Reap CMA Duncan Dull) (June 29, 2010 8:50 AM)  O2 Flow:  Room air CC: abdominal pain, back pain, wheezing   Primary Care Provider:  Dr. Tawanna Cooler  CC:  abdominal pain, back pain, and wheezing.  History of Present Illness: Nicholas Schmitt is a 75 year old single male, nonsmoker, who comes in today with a 3-day history of abdominal pain.  He states he awoke this past Saturday morning, felt well, had some bacon and eggs for breakfast, and a half-hour later experienced some right upper quadrant abdominal pain.  Since that time.  The pain has been constant, although it waxes and wanes.  He said no fever, chills, nausea, vomiting, diarrhea, or urinary tract difficulties.  He states the pain is dull, waxes and wanes.  A4 on a scale of one to 10.  He slept last night.  The pain did not keep him awake.  Family history negative  The patient was sent for lab work and x-ray         and returned at 4 p.m. for follow-up through the chest x-ray and KUB were unrevealing.  The lab work showed slight elevation in blood sugar 124, but he wasn't fasting.  White cell count 9700 with normal differential.  LFTs normal amylase 136, lipase 62.  His symptoms are the same since some of this morning.  Again, no fever, chills.  We discussed risks, options elected to keep him at bedrest at home clear liquids something mild for pain and will get a general surgeon consult tomorrow.  If however, the pain gets worse.  He has fever.  He will go  directly to the emergency room tonight  Allergies: No Known Drug Allergies  Past History:  Past medical, surgical, family and social histories (including risk factors) reviewed for relevance to current acute and chronic  problems.  Past Medical History: Reviewed history from 04/06/2010 and no changes required. 1. Coronary artery disease: Positive stress test in 2006 led to left heart cath (8/06) showing 95% prox RCA, 90% CFX, and 90% mLAD.  Patient had a Cypher DES to all 3 lesions.  2. Hyperlipidemia 3. Hypertension 4. HEARING LOSS 5. Prostate cancer, hx of: tx with prostatectomy 6. AAA: 3.2 cm in 11/09  Past Surgical History: Reviewed history from 12/25/2006 and no changes required. PROSTATE CANCER-05' PTCA/stent-06'  Family History: Reviewed history from 04/06/2010 and no changes required. No premature CAD.   Social History: Reviewed history from 03/05/2008 and no changes required. Retired Former Smoker Alcohol use-no Drug use-no Regular exercise-yes  Review of Systems      See HPI  Physical Exam  General:  Well-developed,well-nourished,in no acute distress; alert,appropriate and cooperative throughout examination Abdomen:  the abdomen is flat.  The bowel sounds are normal.  No organomegaly.  Tenderness right upper quadrant and no rebound   Impression & Recommendations:  Problem # 1:  ABDOMINAL PAIN, RIGHT UPPER QUADRANT (ICD-789.01) Assessment Deteriorated  Orders: Venipuncture (45409) T-Acute Abdomen (2 view w/ PA & Chest (81191YN) Specimen Handling (82956) TLB-BMP (Basic Metabolic Panel-BMET) (80048-METABOL) TLB-CBC Platelet - w/Differential (85025-CBCD) TLB-Hepatic/Liver Function Pnl (80076-HEPATIC) TLB-Amylase (82150-AMYL)  TLB-Lipase (83690-LIPASE)  Problem # 2:  CHOLECYSTITIS, ACUTE (ICD-575.0) Assessment: New  Orders: Surgical Referral (Surgery)  Complete Medication List: 1)  Nitrostat 0.4 Mg Subl (Nitroglycerin) .[name].. 1 tablet under tongue at onset of chest pain; you may repeat every 5 minutes for up to 3 doses. 2)  Multivitamins Tabs (Multiple vitamin) .[name].. 1 tab once daily 3)  Fish Oil 1000 Mg Caps (Omega-3 fatty acids) .[name].. 1 cap two times a day 4)   Zestoretic 20-12.5 Mg Tabs (Lisinopril-hydrochlorothiazide) .... Take 1 tablet by mouth every morning 5)  Zocor 80 Mg Tabs (Simvastatin) .[name].. 1 tab @ bedtime 6)  Plavix 75 Mg Tabs (Clopidogrel bisulfate) .... One daily 7)  Prednisone 20 Mg Tabs (Prednisone) .... Uad 8)  Proventil Hfa 108 (90 Base) Mcg/act Aers (Albuterol sulfate) .... 2 puffs q4-6 hours as needed wheezing 9)  Vicodin Es 7.5-750 Mg Tabs (Hydrocodone-acetaminophen) .... Take 1 tablet by mouth four times a day as needed  Patient Instructions: 1)  we will get your blood work now..........., and  go to the main office now for an x-ray of y  abdomen.  Stay on a  clear liquid diet return at 4 p.m today for follow-up 2)  Clear liquids. 3)  Bed rest at home. 4)  General surgery evaluation tomorrow. 5)  However in the meantime, if y  pain gets worse. or u  developed fever come directly to the hospital emergency room Prescriptions: VICODIN ES 7.5-750 MG TABS (HYDROCODONE-ACETAMINOPHEN) Take 1 tablet by mouth four times a day as needed  #30 x 0   Entered and Authorized by:   Roderick Pee MD   Signed by:   Roderick Pee MD on 06/29/2010   Method used:   Print then Give to Patient   RxID:   [id number]    Orders Added: 1)  Venipuncture [56213] 2)  T-Acute Abdomen (2 view w/ PA & Chest [74022TC] 3)  Est. Patient Level IV [08657] 4)  Specimen Handling [99000] 5)  Surgical Referral [Surgery] 6)  TLB-BMP (Basic Metabolic Panel-BMET) [80048-METABOL] 7)  TLB-CBC Platelet - w/Differential [85025-CBCD] 8)  TLB-Hepatic/Liver Function Pnl [80076-HEPATIC] 9)  TLB-Amylase [82150-AMYL] 10)  TLB-Lipase [83690-LIPASE]

## 2010-07-15 NOTE — Progress Notes (Signed)
----   Converted from flag ---- ---- 06/08/2010 12:34 PM, Edwyna Perfect MD wrote: pls notify cxr does NOT show pneumonia or other cause for shortness of breath ------------------------------  pt aware

## 2010-07-16 ENCOUNTER — Ambulatory Visit (HOSPITAL_COMMUNITY)
Admission: RE | Admit: 2010-07-16 | Discharge: 2010-07-16 | Disposition: A | Discharge status: Home / Self Care | Payer: MEDICARE | Source: Ambulatory Visit | Attending: Surgery | Admitting: Surgery

## 2010-07-16 DIAGNOSIS — R1011 Right upper quadrant pain: Secondary | ICD-10-CM | POA: Insufficient documentation

## 2010-07-16 DIAGNOSIS — R112 Nausea with vomiting, unspecified: Secondary | ICD-10-CM

## 2010-07-16 DIAGNOSIS — K8020 Calculus of gallbladder without cholecystitis without obstruction: Secondary | ICD-10-CM

## 2010-07-16 MED ORDER — TECHNETIUM TC 99M MEBROFENIN: 6.0000 | Freq: Once | Status: DC

## 2010-07-20 ENCOUNTER — Telehealth: Payer: Self-pay | Admitting: *Deleted

## 2010-07-20 DIAGNOSIS — J45909 Unspecified asthma, uncomplicated: Secondary | ICD-10-CM

## 2010-07-20 NOTE — Telephone Encounter (Signed)
Addended by: Kern Reap on: 07/20/2010 05:19 PM   Modules accepted: Orders

## 2010-07-20 NOTE — Telephone Encounter (Signed)
Pt wants to know if Dr. Tawanna Cooler will write a prescription for a nebulizer for him??? Please call and let him know.

## 2010-07-20 NOTE — Telephone Encounter (Signed)
If he feels like a nebulizer would be helpful, then we would want to get the pulmonary people involved

## 2010-07-20 NOTE — Telephone Encounter (Signed)
Pt would like to be referred to Pulmonary.

## 2010-07-21 ENCOUNTER — Telehealth: Payer: Self-pay | Admitting: *Deleted

## 2010-07-21 NOTE — Telephone Encounter (Signed)
Pt refuses to be seen .  Wants to be treated over the phone.

## 2010-07-21 NOTE — Telephone Encounter (Signed)
Needs appt with Dr. Tawanna Cooler tomorrow.  Too much congestion and SOB to wait for Pulmonologist.

## 2010-07-22 ENCOUNTER — Emergency Department (HOSPITAL_COMMUNITY): Payer: MEDICARE

## 2010-07-22 ENCOUNTER — Emergency Department (HOSPITAL_COMMUNITY)
Admission: EM | Admit: 2010-07-22 | Discharge: 2010-07-23 | Disposition: A | Discharge status: Home / Self Care | Payer: MEDICARE | Attending: Emergency Medicine | Admitting: Emergency Medicine

## 2010-07-22 ENCOUNTER — Encounter: Payer: Self-pay | Admitting: Pulmonary Disease

## 2010-07-22 ENCOUNTER — Encounter: Payer: Self-pay | Admitting: Family Medicine

## 2010-07-22 ENCOUNTER — Ambulatory Visit (INDEPENDENT_AMBULATORY_CARE_PROVIDER_SITE_OTHER): Payer: MEDICARE | Admitting: Family Medicine

## 2010-07-22 ENCOUNTER — Encounter (HOSPITAL_COMMUNITY): Payer: Self-pay | Admitting: Radiology

## 2010-07-22 VITALS — BP 124/90 | Temp 98.5°F | Ht 72.0 in | Wt 220.0 lb

## 2010-07-22 DIAGNOSIS — J45909 Unspecified asthma, uncomplicated: Secondary | ICD-10-CM | POA: Insufficient documentation

## 2010-07-22 DIAGNOSIS — J45901 Unspecified asthma with (acute) exacerbation: Secondary | ICD-10-CM

## 2010-07-22 DIAGNOSIS — R0989 Other specified symptoms and signs involving the circulatory and respiratory systems: Secondary | ICD-10-CM | POA: Insufficient documentation

## 2010-07-22 DIAGNOSIS — R0609 Other forms of dyspnea: Secondary | ICD-10-CM | POA: Insufficient documentation

## 2010-07-22 DIAGNOSIS — R059 Cough, unspecified: Secondary | ICD-10-CM | POA: Insufficient documentation

## 2010-07-22 DIAGNOSIS — I2510 Atherosclerotic heart disease of native coronary artery without angina pectoris: Secondary | ICD-10-CM | POA: Insufficient documentation

## 2010-07-22 DIAGNOSIS — R05 Cough: Secondary | ICD-10-CM | POA: Insufficient documentation

## 2010-07-22 HISTORY — DX: Essential (primary) hypertension: I10

## 2010-07-22 LAB — CBC
HCT: 36.9 % — ABNORMAL LOW (ref 39.0–52.0)
MCH: 31.1 pg (ref 26.0–34.0)
Platelets: 153 10*3/uL (ref 150–400)
RDW: 14 % (ref 11.5–15.5)

## 2010-07-22 LAB — DIFFERENTIAL
Basophils Relative: 0 % (ref 0–1)
Eosinophils Absolute: 0.8 10*3/uL — ABNORMAL HIGH (ref 0.0–0.7)
Eosinophils Relative: 12 % — ABNORMAL HIGH (ref 0–5)
Monocytes Absolute: 0.6 10*3/uL (ref 0.1–1.0)
Neutro Abs: 3.8 10*3/uL (ref 1.7–7.7)

## 2010-07-22 LAB — BASIC METABOLIC PANEL
BUN: 22 mg/dL (ref 6–23)
CO2: 25 mEq/L (ref 19–32)
Calcium: 9.5 mg/dL (ref 8.4–10.5)
Creatinine, Ser: 1.02 mg/dL (ref 0.4–1.5)
Glucose, Bld: 149 mg/dL — ABNORMAL HIGH (ref 70–99)
Sodium: 140 mEq/L (ref 135–145)

## 2010-07-22 MED ORDER — IOHEXOL 350 MG/ML SOLN
100.0000 mL | Freq: Once | INTRAVENOUS | Status: AC | PRN
  Administered 2010-07-22: 100 mL via INTRAVENOUS

## 2010-07-22 NOTE — Telephone Encounter (Signed)
patient  Came in today for an office visit

## 2010-07-22 NOTE — Progress Notes (Signed)
  Subjective:    Patient ID: Nicholas Schmitt, male    DOB: 02-Sep-1935, 75 y.o.   MRN: [id number]  HPIRon Is a  75 year old male, who comes in today for exacerbation of asthma.  He was seen in mid January.  Here by Dr. Rodena Medin in my absence.  At that time.  He had a flare of his asthma and was treated with a shot of steroids, burst and taper of oral steroids, albuterol hand-held multidose inhaler two puffs t.i.d.,.  Chest x-ray showed no infection.  He did well until about a week ago when his asthma flared up.  Last night.  It was so severe he was unable to sleep.  He used 25+ puffs of the albuterol!!!!!!!!!!!!!!!!!.  He comes in here markedly short of breath.  I explained to him in the future.  The most the albuterol he could take to be 2+ every 6 to 8 hours.  The frequent use of albuterol, like he was doing last night Can  result in sudden death    Review of Systems Negative   Objective:   Physical Exam    Well-developed well-nourished, male, very hard of hearing, markedly short of breath.  HEENT negative.  Neck was, supple.  He's using his expiratory muscles of respirations symmetrical.  Breath sounds inspiratory and extraocular wheezing    Assessment & Plan:  Severe asthma over use of albuterol.  I called his son.  He will take him to the hospital for admission

## 2010-07-22 NOTE — Telephone Encounter (Signed)
I [name] please call if he has already been to pulmonary then he needs to call pulmonary and be seen by them.  Today.  If he does not have a pulmonologist, then we will see him

## 2010-07-22 NOTE — Patient Instructions (Signed)
Go directly to [facility] long emergency room.  They will do a preliminary evaluation and admitted to the hospital

## 2010-07-26 ENCOUNTER — Encounter: Payer: Self-pay | Admitting: Family Medicine

## 2010-08-11 ENCOUNTER — Encounter: Payer: Self-pay | Admitting: Pulmonary Disease

## 2010-08-11 ENCOUNTER — Institutional Professional Consult (permissible substitution) (INDEPENDENT_AMBULATORY_CARE_PROVIDER_SITE_OTHER): Payer: MEDICARE | Admitting: Pulmonary Disease

## 2010-08-11 DIAGNOSIS — R0602 Shortness of breath: Secondary | ICD-10-CM

## 2010-08-16 ENCOUNTER — Telehealth: Payer: Self-pay | Admitting: Family Medicine

## 2010-08-17 ENCOUNTER — Ambulatory Visit (INDEPENDENT_AMBULATORY_CARE_PROVIDER_SITE_OTHER): Payer: MEDICARE | Admitting: Internal Medicine

## 2010-08-17 ENCOUNTER — Encounter: Payer: Self-pay | Admitting: Internal Medicine

## 2010-08-17 DIAGNOSIS — J45901 Unspecified asthma with (acute) exacerbation: Secondary | ICD-10-CM

## 2010-08-17 DIAGNOSIS — I10 Essential (primary) hypertension: Secondary | ICD-10-CM

## 2010-08-17 MED ORDER — VALSARTAN-HYDROCHLOROTHIAZIDE 320-25 MG PO TABS: 1.0000 | ORAL_TABLET | Freq: Every day | ORAL | Status: DC

## 2010-08-17 MED ORDER — BUDESONIDE-FORMOTEROL FUMARATE 160-4.5 MCG/ACT IN AERO: 2.0000 | INHALATION_SPRAY | Freq: Two times a day (BID) | RESPIRATORY_TRACT | Status: DC

## 2010-08-17 NOTE — Assessment & Plan Note (Signed)
Significant clinical improvement. S/p pulmonary consult. Continue symbicort. Samples and prescription provided.

## 2010-08-17 NOTE — Assessment & Plan Note (Addendum)
Suboptimal control after change to arb/hct combination. Agree with dose increase however has been less than 48 hours since increase. Recommend continuation of current dose with daily monitoring of BP. Call with BP report within 5 days.  Samples and prescription provided for diovan hct.

## 2010-08-17 NOTE — Progress Notes (Signed)
  Subjective:    Patient ID: Nicholas Schmitt, male    DOB: 1935-09-04, 75 y.o.   MRN: [id number]  HPI  Pt presents to clinic for evaluation of elevated BP. States frequent asthma difficulty is much improved after seeing pulmonary. Was placed on symbicort sample with significant improvement of dsypnea. No thrush with symbicort. States pulmonary recommended stopping ace/hctz and replaced with samples of diovan hct 160/12.5mg  qd. No adverse effects with medication. Since change bp has risen. No alleviating or exacerbating factors. Log reviewed with typical sbp range 140's-160's. Asx without ha or dizziness. Two days ago began taking 2 diovan hct at instruction of pulmonary. No other complaints.  Reviewed PMH, medications and allergies.    Review of Systems  Constitutional: Negative for fever and chills.  Respiratory: Negative for cough, shortness of breath and wheezing.   Neurological: Negative for weakness, light-headedness, numbness and headaches.       Objective:   Physical Exam  [nursing notereviewed. Constitutional: He appears well-developed and well-nourished.  HENT:  Head: Normocephalic and atraumatic.  Left Ear: External ear normal.  Nose: Nose normal.  Eyes: Conjunctivae are normal. No scleral icterus.  Skin: Skin is warm and dry.          Assessment & Plan:

## 2010-08-19 ENCOUNTER — Institutional Professional Consult (permissible substitution): Payer: Self-pay | Admitting: Pulmonary Disease

## 2010-08-21 ENCOUNTER — Emergency Department (HOSPITAL_COMMUNITY): Payer: No Typology Code available for payment source

## 2010-08-21 ENCOUNTER — Emergency Department (HOSPITAL_COMMUNITY)
Admission: EM | Admit: 2010-08-21 | Discharge: 2010-08-21 | Disposition: A | Discharge status: Home / Self Care | Payer: No Typology Code available for payment source | Attending: Emergency Medicine | Admitting: Emergency Medicine

## 2010-08-21 DIAGNOSIS — R220 Localized swelling, mass and lump, head: Secondary | ICD-10-CM | POA: Insufficient documentation

## 2010-08-21 DIAGNOSIS — Y92009 Unspecified place in unspecified non-institutional (private) residence as the place of occurrence of the external cause: Secondary | ICD-10-CM | POA: Insufficient documentation

## 2010-08-21 DIAGNOSIS — J3489 Other specified disorders of nose and nasal sinuses: Secondary | ICD-10-CM | POA: Insufficient documentation

## 2010-08-21 DIAGNOSIS — I2510 Atherosclerotic heart disease of native coronary artery without angina pectoris: Secondary | ICD-10-CM | POA: Insufficient documentation

## 2010-08-21 DIAGNOSIS — S022XXA Fracture of nasal bones, initial encounter for closed fracture: Secondary | ICD-10-CM | POA: Insufficient documentation

## 2010-08-21 DIAGNOSIS — W010XXA Fall on same level from slipping, tripping and stumbling without subsequent striking against object, initial encounter: Secondary | ICD-10-CM | POA: Insufficient documentation

## 2010-08-21 DIAGNOSIS — J45909 Unspecified asthma, uncomplicated: Secondary | ICD-10-CM | POA: Insufficient documentation

## 2010-08-24 NOTE — Assessment & Plan Note (Signed)
Summary: consult for dyspnea   Copy to:  Kelle Darting Primary Provider/Referring Provider:  Roderick Pee MD  CC:  Pulmonary Consult for increased sob with exertion or at rest x 3 months.  .  History of Present Illness: the pt is a 75y/o male who I have been asked to see for dyspnea.  He states he began to have issues about 3mos ago, but has not been progressive in nature.  He has excellent exercise tolerance, and tells me he walks about 1 1/2 miles a day.  However, he was able to split wood prior to the onset of his worsening sob.  He has no issues in the am with physical activity, but is usually sob by the evening.  He can awaken during the night with sob.  He has a mild cough primarily in the am's, with only scant mucus production.  He also has am wheezing on occasion.  He does have a h/o cardiac disease, and an echo in Nov of last year showed normal LV fxn.  However, he did have evidence for diastolic dysfunction with mildly dilated LA and mildly elevated PA pressures.  The pt denies having asthma as a child.  He has had a recent ct chest which showed only airway thickening in the lower lobes.  Current Medications (verified): 1)  Nitrostat 0.4 Mg Subl (Nitroglycerin) .[name].. 1 Tablet Under Tongue At Onset of Chest Pain; You May Repeat Every 5 Minutes For Up To 3 Doses. 2)  Multivitamins   Tabs (Multiple Vitamin) .[name].. 1 Tab Once Daily 3)  Fish Oil 1000 Mg Caps (Omega-3 Fatty Acids) .[name].. 1 Cap Two Times A Day 4)  Zestoretic 20-12.5 Mg Tabs (Lisinopril-Hydrochlorothiazide) .... Take 1 Tablet By Mouth Every Morning 5)  Zocor 80 Mg Tabs (Simvastatin) .[name].. 1 Tab @ Bedtime 6)  Plavix 75 Mg Tabs (Clopidogrel Bisulfate) .... One Daily 7)  Proventil Hfa 108 (90 Base) Mcg/act Aers (Albuterol Sulfate) .... 2 Puffs Q4-6 Hours As Needed Wheezing 8)  Aspirin Low Dose 81 Mg Tabs (Aspirin) .... Take 1 Tablet By Mouth Two Times A Day 9)  Ginkgo Biloba 120 Mg Caps (Ginkgo Biloba) .... Take 1 Tablet By Mouth Once  A Day  Allergies (verified): No Known Drug Allergies  Past History:  Past Medical History: Reviewed history from 04/06/2010 and no changes required. 1. Coronary artery disease: Positive stress test in 2006 led to left heart cath (8/06) showing 95% prox RCA, 90% CFX, and 90% mLAD.  Patient had a Cypher DES to all 3 lesions.  2. Hyperlipidemia 3. Hypertension 4. HEARING LOSS 5. Prostate cancer, hx of: tx with prostatectomy 6. AAA: 3.2 cm in 11/09  Past Surgical History: Reviewed history from 12/25/2006 and no changes required. PROSTATE CANCER-05' PTCA/stent-06'  Family History: Reviewed history from 04/06/2010 and no changes required. No premature CAD.  asthma: daughter, brother (deceased clotting disorders: father deceased at age 46 mother deceased at age 90.    Social History: Reviewed history from 03/05/2008 and no changes required. 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  pt is divorced and lives alone. Former Smoker.   started at age 72.  3 ppd.  Quit 1995.  Alcohol use-no Drug use-no Regular exercise-yes  Review of Systems       The patient complains of nasal congestion/difficulty breathing through nose and sneezing.  The patient denies shortness of breath at rest, productive cough, non-productive cough, coughing up blood, chest pain, irregular heartbeats, acid heartburn, indigestion, loss of appetite, weight  change, abdominal pain, difficulty swallowing, sore throat, tooth/dental problems, headaches, itching, ear ache, anxiety, depression, hand/feet swelling, joint stiffness or pain, rash, change in color of mucus, and fever.    Vital Signs:  Patient profile:   75 year old male Height:      70.75 inches Weight:      222 pounds BMI:     31.29 O2 Sat:      93 % on Room air Temp:     97.9 degrees F oral Pulse rate:   63 / minute BP sitting:   112 / 64  (left arm) Cuff size:   regular  Vitals Entered By: Arman Filter LPN (August 11, 2010 11:43 AM)  O2  Flow:  Room air CC: Pulmonary Consult for increased sob with exertion or at rest x 3 months.   Comments Medications reviewed with patient Arman Filter LPN  August 11, 2010 11:43 AM    Physical Exam  General:  wd male in nad Eyes:  PERRLA and EOMI.   Nose:  patent without discharge no purulence seen Mouth:  no exudates, no lesions.    Neck:  no jvd, tmg, LN Lungs:  prominent upper airway wheezing,  but may have true lower wheeze in bases.  Difficult to tell no stridor, but does have some noise over larnyx with inspiration. Heart:  rrr, no mrg Abdomen:  soft and nontender, bs+ Extremities:  no edema or cyanosis  pulses intact distally Neurologic:  alert and oriented, moves all 4 .   Impression & Recommendations:  Problem # 1:  DYSPNEA (ICD-786.05)  the pt has significant doe that began approx 3mos ago, and is worse in the evening.  He does have some airflow obstruction on spirometry today, but he also has a LOT of upper airway noise on inspiration and expiration that resolves with purse lip breathing.  It is unclear if this is due to his ACE inhibitor or whether he may have some type of UA obstruction?  Will treat him for lower airway disease in light of his mild obstruction on spirometry along with definite bronchial wall thickening on his recent ct chest, but will also take him off ACE to see if the upper airway issue resolves.   Will see him back in 2 weeks, but he is to call if not doing well.   Medications Added to Medication List This Visit: 1)  Aspirin Low Dose 81 Mg Tabs (Aspirin) .... Take 1 tablet by mouth two times a day 2)  Ginkgo Biloba 120 Mg Caps (Ginkgo biloba) .... Take 1 tablet by mouth once a day 3)  Prednisone 10 Mg Tabs (Prednisone) .... Take 4 each day for 2 days, then 3 each day for 2 days, then 2 each day for 2 days, then 1 each day for 2 days, then stop  Other Orders: Consultation Level V (16109) Spirometry w/Graph (60454)  Patient Instructions: 1)   will treat with a course of prednisone 2)  start symbicort 160/4.5  2 inhalations am and pm...rinse mouth well.   3)  followup with me in 2 weeks. 4)  stop zestoretic...start diovan 160mg /hctz 12.5 in its place once a day.  Prescriptions: PREDNISONE 10 MG  TABS (PREDNISONE) take 4 each day for 2 days, then 3 each day for 2 days, then 2 each day for 2 days, then 1 each day for 2 days, then stop  #20 x 0   Entered and Authorized by:   Barbaraann Share MD   Signed  by:   Barbaraann Share MD on 08/11/2010   Method used:   Print then Give to Patient   RxID:   [id number]

## 2010-08-24 NOTE — Progress Notes (Signed)
Summary: increased BP since med change-pt called x 2  Phone Note Call from Patient Call back at [facility] Phone [phone]   Caller: Patient Call For: clance Summary of Call: Pt states his bp was 165/85 today and he took two diovan hct 12.5mg  (old rx) wants a rx for new bp med because diovan isn't working for him pls advise.//[facility] Initial call taken by: Darletta Moll,  August 16, 2010 8:42 AM  Follow-up for Phone Call        Pt was seen on 08/11/10 by [facility] and was told to  stop zestoretic...start diovan 160mg /hctz 12.5 in its place once a day.  Called, spoke with pt.  States he started the diovan on Friday.  Sunday bp 189/100.  This am bp 168/85.  He took 2 tablets of the diovan this am d/t increased BP.  BP was 135/79 at last check after the 2 pills.  requesting to change BP med.    KC, pls advise if you would like to change to something else or see if pt can call his PCP, Dr. Tawanna Cooler.  Thanks!  [facility] [address] Follow-up by: Gweneth Dimitri RN,  August 16, 2010 11:19 AM  Additional Follow-up for Phone Call Additional follow up Details #1::        he can take 2 of the diovan 160/12.5 if his bp stays up until he can see dr todd or a NP in their office.  I would prefer they manage his BP issues.  we may need to call and talk to Dr Nelida Meuse nurse if he is having issues getting in to see someone. Additional Follow-up by: Barbaraann Share MD,  August 16, 2010 5:21 PM    Additional Follow-up for Phone Call Additional follow up Details #2::    Pt informed of KC recs and advised he will call Dr. Nelida Meuse office in the morning and schedule same. Will forward to Dr. Tawanna Cooler for [facility]. Zackery Barefoot CMA  August 16, 2010 5:52 PM

## 2010-08-27 ENCOUNTER — Encounter: Payer: Self-pay | Admitting: Pulmonary Disease

## 2010-08-27 ENCOUNTER — Ambulatory Visit (INDEPENDENT_AMBULATORY_CARE_PROVIDER_SITE_OTHER): Payer: MEDICARE | Admitting: Pulmonary Disease

## 2010-08-27 DIAGNOSIS — J449 Chronic obstructive pulmonary disease, unspecified: Secondary | ICD-10-CM | POA: Insufficient documentation

## 2010-08-27 DIAGNOSIS — J45909 Unspecified asthma, uncomplicated: Secondary | ICD-10-CM

## 2010-08-31 ENCOUNTER — Ambulatory Visit (INDEPENDENT_AMBULATORY_CARE_PROVIDER_SITE_OTHER): Payer: MEDICARE | Admitting: Family Medicine

## 2010-08-31 ENCOUNTER — Encounter: Payer: Self-pay | Admitting: Family Medicine

## 2010-08-31 DIAGNOSIS — I10 Essential (primary) hypertension: Secondary | ICD-10-CM

## 2010-08-31 DIAGNOSIS — S022XXA Fracture of nasal bones, initial encounter for closed fracture: Secondary | ICD-10-CM

## 2010-08-31 MED ORDER — LOSARTAN POTASSIUM 25 MG PO TABS: 25.0000 mg | ORAL_TABLET | Freq: Every day | ORAL | Status: DC

## 2010-08-31 NOTE — Progress Notes (Signed)
  Subjective:    Patient ID: Nicholas Schmitt, male    DOB: [birth year], 75 y.o.   MRN: [id number]  Nicholas Schmitt is a 75 year old male, who comes in today for evaluation of hypertension, and a broken nose.  He [place]  He hit his nose, and went to the emergency room.  They did a CT scan of his head until May of fractured nose.  He is relatively pain-free now.  No bleeding.  He was given a change of his medication and pulmonary.  He was on an ACE inhibitor, and that was stopped.  He was given an arm however, the blood pressure medication is too strong.  He states his blood pressure dropped to 80 systolic.  Also, he was given a prescription for Diovan, which is too expensive    Review of Systems    Review of systems otherwise negative.  Cardiac ENT review of systems negative Objective:   Physical Exam    Well-developed well-nourished, male in no acute distress.  Examination that shows no bleeding.  There is a septal deviation to the right.    Assessment & Plan:  Nasal fracture.......... Ice observation.  Hypertension.  Change to losartan BP checked daily.  Follow-up in 3 weeks

## 2010-08-31 NOTE — Patient Instructions (Signed)
Take the Cozaar, one tablet daily in the morning.  Check your blood pressure daily in the morning.  Return in 3 weeks for follow-up

## 2010-09-07 ENCOUNTER — Ambulatory Visit (INDEPENDENT_AMBULATORY_CARE_PROVIDER_SITE_OTHER): Payer: MEDICARE | Admitting: Family Medicine

## 2010-09-07 ENCOUNTER — Encounter: Payer: Self-pay | Admitting: Family Medicine

## 2010-09-07 ENCOUNTER — Telehealth: Payer: Self-pay | Admitting: *Deleted

## 2010-09-07 ENCOUNTER — Ambulatory Visit: Payer: MEDICARE | Admitting: Family Medicine

## 2010-09-07 VITALS — BP 140/74 | Temp 97.8°F | Wt 224.0 lb

## 2010-09-07 DIAGNOSIS — I10 Essential (primary) hypertension: Secondary | ICD-10-CM

## 2010-09-07 NOTE — Progress Notes (Signed)
  Subjective:    Patient ID: Nicholas Schmitt, male    DOB: 1935/07/09, 75 y.o.   MRN: [id number]  HPI Nicholas Schmitt is a 75 year old male, who comes in today for elevated blood pressure.  He states this morning he woke up checks his blood pressure is 180 over hundred.  He called and was sized to come here.  BP by me right arm sitting position 150/80.  I think he has a malfunctioning cough.  We stopped the Diovan and switch to Cozaar 25 mg daily.  He's doing well.  BPs are fine   Review of Systems General and cardiovascular review of systems negative    Objective:   Physical Exam    Well-developed well-nourished, male in no acute distress.  BP right arm sitting position 150/80.  Pulse 70 and regular    Assessment & Plan:  Hypertension under good control,,,,,,,,, continue Cozaar 25 mg daily,,,, and blood pressure cuff to more the last check ait

## 2010-09-07 NOTE — Telephone Encounter (Signed)
Pt's BP is running 187/07, and wants Dr. Tawanna Cooler to change his meds.  Then a while later it was 187/97

## 2010-09-07 NOTE — Patient Instructions (Signed)
Continue the Cozaar 25 mg daily.  Check your blood pressure daily in the morning.  Bring your blood pressure cuffs back to the office tomorrow and let us recheck them

## 2010-09-07 NOTE — Telephone Encounter (Signed)
Schedule office visit today since blood pressure is markedly elevated

## 2010-09-07 NOTE — Telephone Encounter (Signed)
Done

## 2010-09-09 NOTE — Assessment & Plan Note (Signed)
Summary: rov for asthma, upper airway issues due to ACE?   Copy to:  Kelle Darting Primary Provider/Referring Provider:  Roderick Pee MD  CC:  2 week f/u appt.  Pt states breathing has improved while on Symbicort but states he is almost out of samples.  Pt states he had been taking 2 tabs of the Diovan 160/12.5 but states his bp got "too low" so he only took one tab today.  Pt does c/o coughing up light yellow sputum.  Pt states he recently tripped over a rope at a golf course and broke his nose. [name]  History of Present Illness: the pt comes in today for f/u of his upper and lower airway symptoms.  At the last visit, he was documented to have airflow obstruction c/w asthma by history, but clearly had upper airway wheezing as well that I felt was due to his ACe inhibitor.  He has been started on symbicort, and was taken off his ACE. He comes in today where he is much better.  He has less sob, less cough.  Current Medications (verified): 1)  Nitrostat 0.4 Mg Subl (Nitroglycerin) .[name].. 1 Tablet Under Tongue At Onset of Chest Pain; You May Repeat Every 5 Minutes For Up To 3 Doses. 2)  Multivitamins   Tabs (Multiple Vitamin) .[name].. 1 Tab Once Daily 3)  Fish Oil 1000 Mg Caps (Omega-3 Fatty Acids) .[name].. 1 Cap Two Times A Day 4)  Diovan Hct 160-12.5 Mg Tabs (Valsartan-Hydrochlorothiazide) .... Take 1 To 2 Tabs By Mouth Daily 5)  Zocor 80 Mg Tabs (Simvastatin) .[name].. 1 Tab @ Bedtime 6)  Plavix 75 Mg Tabs (Clopidogrel Bisulfate) .... One Daily 7)  Proventil Hfa 108 (90 Base) Mcg/act Aers (Albuterol Sulfate) .... 2 Puffs Q4-6 Hours As Needed Wheezing 8)  Aspirin Low Dose 81 Mg Tabs (Aspirin) .... Take 1 Tablet By Mouth Two Times A Day 9)  Ginkgo Biloba 120 Mg Caps (Ginkgo Biloba) .... Take 1 Tablet By Mouth Once A Day 10)  Symbicort 160-4.5 Mcg/act  Aero (Budesonide-Formoterol Fumarate) .... Two Puffs Twice Daily  Allergies (verified): No Known Drug Allergies  Review of Systems       The patient complains  of shortness of breath with activity, productive cough, headaches, and nasal congestion/difficulty breathing through nose.  The patient denies shortness of breath at rest, non-productive cough, coughing up blood, chest pain, irregular heartbeats, acid heartburn, indigestion, loss of appetite, weight change, abdominal pain, difficulty swallowing, sore throat, tooth/dental problems, sneezing, itching, ear ache, anxiety, depression, hand/feet swelling, joint stiffness or pain, rash, change in color of mucus, and fever.    Vital Signs:  Patient profile:   75 year old male Height:      70.75 inches Weight:      219.38 pounds O2 Sat:      95 % on Room air Temp:     97.8 degrees F oral Pulse rate:   53 / minute BP sitting:   104 / 66  (left arm) Cuff size:   regular  Vitals Entered By: Arman Filter LPN (August 27, 2010 9:59 AM)  O2 Flow:  Room air CC: 2 week f/u appt.  Pt states breathing has improved while on Symbicort but states he is almost out of samples.  Pt states he had been taking 2 tabs of the Diovan 160/12.5 but states his bp got "too low" so he only took one tab today.  Pt does c/o coughing up light yellow sputum.  Pt states he recently  tripped over a rope [place] a golf course and broke his nose.  Comments Medications reviewed with patient Arman Filter LPN  August 27, 2010 10:02 AM    Physical Exam  General:  wd male in nad  Lungs:  totally clear to auscultation  mild upper airway pseudowheezing  Heart:  rrr Extremities:  no edema or cyanosis  Neurologic:  alert and oriented, moves all 4    Impression & Recommendations:  Problem # 1:  INTRINSIC ASTHMA, UNSPECIFIED (ICD-493.10) the pt has done much better since being on symbicort and off ACE.  He is breathing better, and does not have near the upper airway pseudowheezing today compared to last week. I really think he should stay off an ACE for BP.  I have asked the pt to stay on symbicort, and to followup with me in  6mos  Medications Added to Medication List This Visit: 1)  Diovan Hct 160-12.5 Mg Tabs (Valsartan-hydrochlorothiazide) .... Take 1 to 2 tabs by mouth daily 2)  Symbicort 160-4.5 Mcg/act Aero (Budesonide-formoterol fumarate) .... Two puffs twice daily  Other Orders: Est. Patient Level III (19147)  Patient Instructions: 1)  stay on symbicort as directed, and proventil only for rescue. 2)  only take one of the diovan a day and monitor BP.  Please call Dr. Nelida Meuse office today to get blood pressure medicine adjusted.  I will send a note to him today to give him a "heads up". 3)  will see you back in 6mos   Prescriptions: SYMBICORT 160-4.5 MCG/ACT  AERO (BUDESONIDE-FORMOTEROL FUMARATE) Two puffs twice daily  #1 x 6   Entered and Authorized by:   Barbaraann Share MD   Signed by:   Barbaraann Share MD on 08/27/2010   Method used:   Print then Give to Patient   RxID:   [id number]

## 2010-09-13 ENCOUNTER — Ambulatory Visit: Payer: MEDICARE | Admitting: Family Medicine

## 2010-09-22 ENCOUNTER — Ambulatory Visit: Payer: MEDICARE | Admitting: Family Medicine

## 2010-10-06 ENCOUNTER — Ambulatory Visit (INDEPENDENT_AMBULATORY_CARE_PROVIDER_SITE_OTHER): Payer: MEDICARE | Admitting: Family Medicine

## 2010-10-06 ENCOUNTER — Encounter: Payer: Self-pay | Admitting: Family Medicine

## 2010-10-06 VITALS — BP 210/90 | HR 68 | Temp 97.6°F | Wt 230.0 lb

## 2010-10-06 DIAGNOSIS — I10 Essential (primary) hypertension: Secondary | ICD-10-CM

## 2010-10-06 MED ORDER — LOSARTAN POTASSIUM 100 MG PO TABS: 100.0000 mg | ORAL_TABLET | Freq: Every day | ORAL | Status: DC

## 2010-10-06 NOTE — Progress Notes (Signed)
  Subjective:    Patient ID: Nicholas Schmitt, male    DOB: 04-30-1936, 75 y.o.   MRN: [id number]  HPI Nicholas Schmitt is a 39 r, who comes in today for evaluation of hypertension.  He is on Cozaar 25 mg daily.  BP today 210/90 with his machine was 200/97.  He states he is compliant with his medication.  Otherwise, asymptomatic.  No neurologic symptoms   Review of Systems General and cardiovascular review of systems otherwise negative    Objective:   Physical Exam    Well-developed well-nourished, male in no acute distress    Assessment & Plan:  Hypertension not at goal.  Plan increase Cozaar to 100 mg daily BP check twice daily.  Follow-up in one week

## 2010-10-06 NOTE — Patient Instructions (Signed)
Increase the Cozaar to 100 mg daily.  BP check morning midafternoon and bedtime.  Follow-up in one week with the data and the device

## 2010-10-13 ENCOUNTER — Telehealth: Payer: Self-pay | Admitting: *Deleted

## 2010-10-13 ENCOUNTER — Ambulatory Visit (INDEPENDENT_AMBULATORY_CARE_PROVIDER_SITE_OTHER): Payer: MEDICARE | Admitting: Family Medicine

## 2010-10-13 VITALS — BP 150/88 | Temp 97.7°F | Wt 225.0 lb

## 2010-10-13 DIAGNOSIS — I10 Essential (primary) hypertension: Secondary | ICD-10-CM

## 2010-10-13 MED ORDER — AMLODIPINE BESYLATE 5 MG PO TABS: 5.0000 mg | ORAL_TABLET | Freq: Every day | ORAL | Status: DC

## 2010-10-13 NOTE — Patient Instructions (Signed)
Continue the low start and 100 mg daily.  Add Norvasc 5 mg daily.  Follow-up in 3 weeks

## 2010-10-13 NOTE — Progress Notes (Signed)
  Subjective:    Patient ID: Nicholas Schmitt, male    DOB: [birth year], 75 y.o.   MRN: [id number]  HPI  Nicholas Schmitt Is a 75 year old male, who comes in today for follow-up of hypertension.  He is on losartan 100 mg daily.  BP today 150/80.  Right arm sitting position, however, his digital cuff reads 180/68.  His blood pressure is difficult to hear.  No side effects from medication.  LAD the Norvasc because blood pressure not at goal    Review of Systems General and cardiovascular review of systems otherwise negative    Objective:   Physical Exam    Well-built well-nourished, male in no acute distress.  BP right arm sitting, 150/80    Assessment & Plan:  Hypertension under improved control, but not at goal and Norvasc 5 daily continue BP checks at home, follow-up in 3 weeks

## 2010-10-13 NOTE — Telephone Encounter (Signed)
Dr Steffanie Dunn office called for surgical clearance for this patient.  Is this okay to give?

## 2010-10-18 ENCOUNTER — Encounter: Payer: Self-pay | Admitting: *Deleted

## 2010-10-18 NOTE — Telephone Encounter (Signed)
ok 

## 2010-10-22 ENCOUNTER — Encounter (HOSPITAL_BASED_OUTPATIENT_CLINIC_OR_DEPARTMENT_OTHER)
Admission: RE | Admit: 2010-10-22 | Discharge: 2010-10-22 | Disposition: A | Discharge status: Home / Self Care | Payer: Medicare Other | Source: Ambulatory Visit | Attending: Otolaryngology | Admitting: Otolaryngology

## 2010-10-22 LAB — BASIC METABOLIC PANEL
BUN: 17 mg/dL (ref 6–23)
GFR calc non Af Amer: >60 mL/min (ref >60)
Glucose, Bld: 129 mg/dL — ABNORMAL HIGH (ref 70–99)
Potassium: 4.1 mEq/L (ref 3.5–5.1)

## 2010-10-25 ENCOUNTER — Telehealth: Payer: Self-pay | Admitting: *Deleted

## 2010-10-25 NOTE — Telephone Encounter (Signed)
no

## 2010-10-25 NOTE — Telephone Encounter (Signed)
Pt's BP is averaging 150/80. Wants to know if he needs to increase any of is meds.

## 2010-10-26 ENCOUNTER — Ambulatory Visit (HOSPITAL_BASED_OUTPATIENT_CLINIC_OR_DEPARTMENT_OTHER)
Admission: RE | Admit: 2010-10-26 | Discharge: 2010-10-26 | Disposition: A | Discharge status: Home / Self Care | Payer: Medicare Other | Source: Ambulatory Visit | Attending: Otolaryngology | Admitting: Otolaryngology

## 2010-10-26 DIAGNOSIS — X58XXXA Exposure to other specified factors, initial encounter: Secondary | ICD-10-CM | POA: Insufficient documentation

## 2010-10-26 DIAGNOSIS — J343 Hypertrophy of nasal turbinates: Secondary | ICD-10-CM | POA: Insufficient documentation

## 2010-10-26 DIAGNOSIS — J342 Deviated nasal septum: Secondary | ICD-10-CM | POA: Insufficient documentation

## 2010-10-26 DIAGNOSIS — S022XXA Fracture of nasal bones, initial encounter for closed fracture: Secondary | ICD-10-CM | POA: Insufficient documentation

## 2010-10-26 DIAGNOSIS — Z01812 Encounter for preprocedural laboratory examination: Secondary | ICD-10-CM | POA: Insufficient documentation

## 2010-10-26 NOTE — Telephone Encounter (Signed)
LMTCB on cell phone.

## 2010-10-26 NOTE — Telephone Encounter (Signed)
Tried to call but home number disconnected

## 2010-10-26 NOTE — Assessment & Plan Note (Signed)
[facility]                         GASTROENTEROLOGY OFFICE NOTE   NAME:Schmitt, Nicholas ALTERGOTT                     MRN:          [id number]  DATE:09/03/2007                            DOB:          August 14, 1935    CHIEF COMPLAINT:  For screening colonoscopy.  Takes Plavix.   HISTORY OF PRESENT ILLNESS:  Nicholas Schmitt is a 75 year old white male  sent over to have a screening colonoscopy.  He was sent to see me  through our protocol because he takes Plavix for a previous coronary  artery stent.  He is not having any chest pain or respiratory difficulty  at this time.  He does note a chronic problem for three years or more  after prostate radiation with some fecal seepage.  It is totally  unchanged and basically the same as it has been in the past.   CURRENT MEDICATIONS:  1. Plavix 75 mg daily.  2. Zocor 20 mg nightly.  3. Nitrostat p.r.n.  4. Zestril 40 mg daily.  5. Hydrochlorothiazide one tablet daily.  6. Fish oil daily.  7. Multivitamin daily.   ALLERGIES:  No known drug allergies.   PAST MEDICAL HISTORY:  1. Coronary artery disease with percutaneous transluminal coronary      angioplasty and Cypher stent (drug-eluting), 2006.  He is not      really followed in cardiology at this time.  2. Dyslipidemia.  3. Hypertension.  4. Hearing loss.  5. Prostate cancer with radiation therapy and seed implantation.   FAMILY HISTORY:  No colon cancer.  Our review was negative.   REVIEW OF SYSTEMS:  Decreased hearing.  Chronic back pain.   SOCIAL HISTORY:  He is divorced.  Two sons.  One daughter.  No alcohol,  tobacco or drugs.   PAST SURGICAL HISTORY:  He tells me he has had two sigmoidoscopies in  the past that have been unremarkable, i.e. no polyps, no cancer.   PHYSICAL EXAMINATION:  GENERAL APPEARANCE:  Well-developed, well-  nourished.  VITAL SIGNS:  Height 5 feet, 11 inches. Weight 216 pounds.  Blood  pressure 134/62.  He is overweight to obese.   Pulse is 56.  I find it to  be regular.  HEENT:  The eyes are anicteric. ENT:  Dentures, otherwise free of  lesions in the mouth or posterior pharynx.  LUNGS:  Clear.  HEART:  S1, S2, no rubs or gallops.  ABDOMEN:  Soft, nontender without organomegaly or masses.  NEUROLOGICAL:  He is alert and oriented x3.  Appropriate mood and  affect.   ASSESSMENT:  Screening colonoscopy is appropriate.  He has chronic mild  bowel habit changes, if you will, with a fecal soilage, really just a  soilage problem that I think is related to the radiation and not a  symptom that warrants investigation necessarily.  However, he should  have a screening colonoscopy.  He has underlying coronary artery disease  with previous history of a drug-eluting stent.  He is followed by Dr.  Tawanna Cooler for this, as best I can tell.   RECOMMENDATIONS AND PLANS:  A screening colonoscopy is appropriate.  He  should come off his Plavix for the ideal situation.  We will request Dr.  Nelida Meuse opinion and approval to hold the Plavix 5 to 7 days prior to the  procedure and continue his aspirin during the procedure.  We will let  Dr. Tawanna Cooler know that the patient has previously been seen by Dr. Antoine Poche,  but will ask Dr. Tawanna Cooler first regarding holding the Plavix.     Iva Boop, MD,FACG  Electronically Signed    CEG/MedQ  DD: 09/03/2007  DT: 09/03/2007  Job #: [id number]   cc:   Tinnie Gens A. Tawanna Cooler, MD

## 2010-10-27 NOTE — Telephone Encounter (Signed)
Left message on machine for patient  To continue his medication

## 2010-10-29 NOTE — Discharge Summary (Signed)
NAMEJERRALD, Nicholas Schmitt NO.:  [id number]   MEDICAL RECORD NO.:  [id number]          PATIENT TYPE:  INP   LOCATION:  6525                         FACILITY:  [facility]   PHYSICIAN:  Charlies Constable, M.D. [facility] DATE OF BIRTH:  1936-03-05   DATE OF ADMISSION:  01/31/2005  DATE OF DISCHARGE:  02/02/2005                           DISCHARGE SUMMARY - REFERRING   ADMITTING PHYSICIAN:  Dr. Antoine Poche.   DISCHARGING PHYSICIAN:  Dr. Juanda Chance.   SUMMARY OF HISTORY:  Nicholas Schmitt is a 75 year old male who was referred to  Dr. Antoine Poche on January 28, 2005, after Dr. Tawanna Cooler had arranged a stress test.  During the third stage, with a peak heart rate of 116, he developed 2 mm  horizontal ST segment depression in the inferolateral leads associated with  chest discomfort.  The exercise was stopped because of the symptoms and he  had resolution within about 30 seconds.  There was also noted to be  ventricular ectopy at baseline; however, there is not any mention of  increased ventricular ectopy during stress testing.   He did not have any exertional symptoms of chest discomfort or shortness of  breath while evaluated by Dr. Antoine Poche.  However, with his abnormal stress  test, it is felt that he should undergo cardiac catheterization.  His  history is notable for hyperlipidemia diagnosed in 1995, which Dr. Tawanna Cooler is  managing, remote tobacco use, history of bronchitis and asthma, myocardial  infarction in 1995, catheterization at that time; however, records are not  available at this time of this dictation.  Abdominal hernia, prostate cancer  status post radiation and seed implant.   LABORATORY DATA:  Chest x-ray on January 14, 2005, by Dr. Tawanna Cooler, showed changes  of slight asthma or bronchitis, likely benign summation shadow with small  posterior diaphragmatic hernia or eventration on lateral view.  Otherwise,  no active disease.  There is felt to be no interval change from x-ray on  April 02, 2004.   Preop labs show an H&H of 13.9 and 39.5, normal indices,  platelets 158, WBCs 4.9.  Post procedure, hematology essentially remained  unchanged.  Preadmission PTT was 30, PT 12.4, sodium 141, potassium 4.1, BUN  13, creatinine 0.8, glucose 101.  Post procedure sodium was 140, potassium  4.7, BUN 11, creatinine 0.8, glucose 98.  CK total MB and troponin on  admission was negative.  Post procedure CK MB was negative, troponin was  0.05.  Incidentally, last lipid check was on July 24th, showed total  cholesterol 154, triglycerides 93, HDL 34.4, LDL 98.  TSH at that time was  also 5.3 and a PSA 0.74.   Preadmission EKG showed sinus bradycardia with a ventricular rate of 50.  His EKG on the 23rd was essentially unchanged.   HOSPITAL COURSE:  The patient was brought in for a cardiac catheterization.  This was performed.  Initial dictation was performed by Dr. Andee Lineman in the [facility].  Please refer to dictation.  Essentially, this showed a 95% lesion in  the proximal LAD, 90% lesion in the circumflex and a 90% lesion in the mid  LAD, and a 70-80% lesion in the diagonal branch.   Dr. Juanda Chance performed CYPHER stenting to the RCA, reducing this 95 to less  than 10% and on the 22nd he performed CYPHER stenting to the circumflex and  LAD, reducing these 80 and 90% lesions respectively to 0%.  Post procedure  catheterization sites were intact.  Dr. Myrtis Ser assessed the patient on the  23rd and felt that he could be discharged home.   DISCHARGE DIAGNOSES:  1.  Abnormal stress testing.  2.  Three vessel coronary artery disease status post CYPHER stenting to the      right coronary artery, left anterior descending and circumflex as      previously described.  3.  Hyperlipidemia __________.   DISPOSITION:  Patient is discharged home.  He is asked to maintain a low  salt, fat, cholesterol diet.  He is asked to bring all medications to all  appointments.  He received discharge instructions after his  cardiac  catheterization.   MEDICATIONS INCLUDE:  1.  Plavix 75 mg daily at least for one year.  2.  Nitroglycerin 0.4 p.r.n.  3.  Aspirin 325 mg daily.  4.  Lipitor 20 mg q.h.s.; however, Dr. Tawanna Cooler may be changing this to Zocor.   He will followup with Dr. Jenene Slicker physician assistant, Joellyn Rued, on  the day that Dr. Antoine Poche is in the office, on September 12th at 10:30 a.m.  He is also asked to make a followup appointment with Dr. Tawanna Cooler for continuing  management of his hyperlipidemia and other medical concerns.  At the time of  followup in the office, consideration should be given to adding an ACE  inhibitor.  Not sure if he will be able to tolerate a beta-blocker given his  sinus bradycardia.      Joellyn Rued, P.A. [facility]    ______________________________  Charlies Constable, M.D. [facility]    EW/MEDQ  D:  02/02/2005  T:  02/02/2005  Job:  [id number]   cc:   Tinnie Gens A. Tawanna Cooler, M.D. [facility]  [address] [place]  [place] [address]   Rollene Rotunda, M.D.  [address]  [address]  [place]  [place] [address]

## 2010-10-29 NOTE — Cardiovascular Report (Signed)
NAME:  Nicholas Schmitt, Nicholas Schmitt NO.:  [id number]   MEDICAL RECORD NO.:  [id number]          PATIENT TYPE:  INP   LOCATION:  6525                         FACILITY:  [facility]   PHYSICIAN:  Charlies Constable, M.D. [facility] DATE OF BIRTH:  [birth year]   DATE OF PROCEDURE:  01/31/2005  DATE OF DISCHARGE:                              CARDIAC CATHETERIZATION   CLINICAL HISTORY:  [name] is 75 years old and has no prior history of  known heart disease.  He recently had a treadmill test done by Dr. Tawanna Cooler  because of significant risk factors, and this was positive with significant  ST segment changes.  Dr. Andee Lineman saw him and scheduled him for evaluation  with catheterization in the outpatient laboratory.  This showed a 95% lesion  in the proximal LAD, a 90% lesion in the proximal circumflex artery, and a  90% lesion in the mid-LAD with a 70-80% lesion in a large diagonal branch.  After reviewing the films and discussing them with Dr. Andee Lineman and the  patient, we recommended revascularization with percutaneous coronary  intervention.  Our plan was to treat the right coronary artery today and  stage the LAD and circumflex artery for tomorrow.   PROCEDURE:  The procedure was performed via the right femoral artery with an  arterial sheath and 6 French preformed coronary catheters.  We used the  previous sheath from the diagnostic catheterization.   The patient was given Angiomax bolus and infusion and was given 600 mg of  Plavix.  We used a 6 [place] guiding catheter with side holes and a Pro  Water wire.  We crossed the lesion with the wire without too much  difficulty.  We directly stented with a 3.0 x 13 mm Cypher stent, deploying  this with one inflation of 18 atmospheres for 30 seconds.  We the post-  dilated the single stent with a 3.5 x 8 mm Quantum Maverick, performing two  inflations of 16 and 12 atmospheres.  Final diagnostic studies were then  performed through the guiding catheter.   The patient tolerated the procedure  well and left the laboratory in satisfactory condition.   RESULTS:  Initially the stenosis in the proximal right coronary artery was  estimated at 95%.  Following stenting, this improved to less than 10%.  There was a residual long segmental area of 50% narrowing in the mid-right  coronary artery.   CONCLUSION:  Successful percutaneous coronary intervention of the lesion in  the proximal right coronary artery using a Cypher drug-eluting stent with  improvement in sentinel narrowing from 95% to less than 10%.   RECOMMENDATIONS:  Will plan to do the circumflex and LAD lesions tomorrow.  The patient will need to remain on Plavix for at least a year.           ______________________________  Charlies Constable, M.D. [facility]     BB/MEDQ  D:  01/31/2005  T:  01/31/2005  Job:  [id number]   cc:   Tinnie Gens A. Tawanna Cooler, M.D. [facility]  [address] [place]  [place] [address]   Cardiopulmonary Lab

## 2010-10-29 NOTE — Cardiovascular Report (Signed)
NAME:  Nicholas Schmitt, Nicholas Schmitt NO.:  [id number]   MEDICAL RECORD NO.:  [id number]          PATIENT TYPE:  INP   LOCATION:  6525                         FACILITY:  [facility]   PHYSICIAN:  Charlies Constable, M.D. [facility] DATE OF BIRTH:  1935/09/06   DATE OF PROCEDURE:  02/01/2005  DATE OF DISCHARGE:                              CARDIAC CATHETERIZATION   CLINICAL HISTORY:  Nicholas Schmitt is a 75 years old and had an abnormal stress  test and underwent catheterization in the outpatient laboratory by Dr.  Andee Lineman.  Because of critical anatomy, he was admitted to the hospital and  underwent stenting of the right coronary artery yesterday and was brought  back today for stenting of the circumflex and left anterior descending  artery.  He has had minimal to no symptoms.   PROCEDURE:  The procedure was performed via the left femoral artery using an  arterial sheath and a 6 French Q4 guiding catheter with side holes.  The  patient was given an Angiomax bolus and infusion and was previously on  Plavix.   We elected to first approach the circumflex artery.  We crossed the lesion  with a ProWater wire without difficulty.  We attempted to direct stent with  a 2.5 x 13 mm CYPHER stent but were unable to pass the stent across the  lesion.  We then dilated with a 3.0 x 12 mm Quantum Maverick, performing one  inflation up to 10 atmospheres for 30 seconds.  We then deployed a 2.5 x 13  mm CYPHER stent, deploying this with one inflation of 15 atmospheres for 30  seconds.  We then post-dilated the stent with a 3.25 x 12 mm Quantum  Maverick, performing two inflations up to 16 atmospheres for 30 seconds.   We then approached the left anterior descending artery.  We advanced the  wire across the lesion in the mid LAD, which was located after diagonal  branch.  We initially tried to direct stent this with a 2.25 x 15 mm Mini-  Vision stent but were unable to cross the lesion.  We then predilated with a  2.5 x  12 mm Quantum Maverick, performing one inflation up to 10 atmospheres  for 30 seconds.  We then deployed a 2.25 x 15 mm Mini-Vision stent,  deploying this with one inflation of 10 atmospheres for 30 seconds.  We post-  dilated with a 2.5 x 12 mm Quantum Maverick, performing one inflation up to  16 atmospheres for 30 seconds.  Final diagnostic studies were then performed  through the guiding catheter.  The patient tolerated the procedure well and  left the laboratory in satisfactory condition.   RESULTS:  Initially, the stenosis in the proximal circumflex artery was  estimated at 80%.  Following stenting, this improved to 0%.   Initially, the stenosis in the mid left anterior descending artery was 90%  and following stenting this improved to 0%.   CONCLUSION:  1.  Successful percutaneous coronary intervention of the lesion in the      proximal circumflex artery using a CYPHER drug-eluting stent, with  improvement in sentinel narrowing from 80% to 0%.  2.  Successful percutaneous coronary intervention of a lesion in the mid      left anterior descending using a bare metal stent, with improvement in      sentinel narrowing from 90% to 0%.   DISPOSITION:  The patient __________ for further observation.  The patient  does have moderate disease in the distal LAD, but this did not appear  critical and we elected not to treat this.           ______________________________  Charlies Constable, M.D. [facility]     BB/MEDQ  D:  02/01/2005  T:  02/01/2005  Job:  [id number]   cc:   Tinnie Gens A. Tawanna Cooler, M.D. [facility]  [address] [place]  [place] [address]   Cardiopulmonary Lab   Rollene Rotunda, M.D.  [phone]  [address]  [place]  [place] [address]

## 2010-10-29 NOTE — Op Note (Signed)
NAMEALVEY, BROCKEL              ACCOUNT NO.:  [id number]   MEDICAL RECORD NO.:  [id number]          PATIENT TYPE:  AMB   LOCATION:  NESC                         FACILITY:  [facility]   PHYSICIAN:  Boston Service, M.D.DATE OF BIRTH:  1936/02/22   DATE OF PROCEDURE:  05/04/2004  DATE OF DISCHARGE:                                 OPERATIVE REPORT   References made to B3, DTUI needle loading report as well as office notes  from December 29, 2003; January 20, 2004; January 23, 2004; February 11, 2004, and  consultation from Wynn Banker, M.D.   PREOPERATIVE DIAGNOSIS:  A 75 year old male myocardial infarction, cardiac  catheterization in 1996 diagnosed January 14, 2004, prostatic adenocarcinoma,  Gleason score of 7, 30% of the biopsy on the right and 20% of the biopsy on  the left.  Lengthy discussion with the patient about options for therapy  including watchful waiting, hormonal therapy, radiation therapy and surgery.  The patient refused prostatectomy, strongly preferred radiation therapy, has  completed preliminary course of external therapy, now presents for seed  implant boost.   POSTOPERATIVE DIAGNOSIS:  A 75 year old male myocardial infarction, cardiac  catheterization in 1996 diagnosed January 14, 2004, prostatic adenocarcinoma,  Gleason score of 7, 30% of the biopsy on the right and 20% of the biopsy on  the left.  Lengthy discussion with the patient about options for therapy  including watchful waiting, hormonal therapy, radiation therapy and surgery.  The patient refused prostatectomy, strongly preferred radiation therapy, has  completed preliminary course of external therapy, now presents for seed  implant boost.   PROCEDURE:  Transperineal placement of radioactive iodine seeds.   ANESTHESIA:  General anesthesia.   DRAINS:  A 20 French Foley.   SURGEON:   DESCRIPTION OF PROCEDURE:  The patient was prepped and draped in the dorsal  lithotomy position after institution of  adequate level of general  anesthesia.  Rectal tube and transrectal ultrasound probe were positioned  transrectally.  Foley catheter was inserted per urethra and suprapubic  fluoroscopy unit was positioned.  Transverse and longitudinal scans of the  prostate were obtained and compared to preoperative studies confirmed the  excursion of the prostate at about 3.5 cm.  Once adequate images had been  obtained, stabilization needles were then placed and then needles with  radioactive iodine mCi per seed, 0.400, total of 32 seeds loaded on 12  needles were then placed according to prearranged coordinates.  References  made to the B3, DTUI needle loading report.  Once all needles appeared to be  in good position and seeds had been placed in appropriate position, rectal  tube and transrectal ultrasound probe were removed, indwelling urethral  catheter was removed, fiberoptic cystoscopy showed a normal  urethra and sphincter.  Short nonobstructive prostatic urethra.  Bladder  showed no evidence of perforation, seed or bleeding.  Clear efflux right and  left orifice.  Foley catheter was placed and left to straight drain and the  patient was returned to recovery in satisfactory condition.      RH/MEDQ  D:  05/04/2004  T:  05/04/2004  Job:  [id number]   cc:   Tinnie Gens A. Tawanna Cooler, M.D. [facility]   Wynn Banker, M.D.  [address] [facility]  [place]  [place] [address]  Fax: [phone]

## 2010-11-03 ENCOUNTER — Encounter: Payer: Self-pay | Admitting: Family Medicine

## 2010-11-03 ENCOUNTER — Ambulatory Visit (INDEPENDENT_AMBULATORY_CARE_PROVIDER_SITE_OTHER): Payer: Medicare Other | Admitting: Family Medicine

## 2010-11-03 DIAGNOSIS — J45909 Unspecified asthma, uncomplicated: Secondary | ICD-10-CM

## 2010-11-03 DIAGNOSIS — I10 Essential (primary) hypertension: Secondary | ICD-10-CM

## 2010-11-03 DIAGNOSIS — R0602 Shortness of breath: Secondary | ICD-10-CM

## 2010-11-03 NOTE — Progress Notes (Signed)
  Subjective:    Patient ID: Greggory Keen, male    DOB: Oct 16, 1935, 75 y.o.   MRN: [id number]  HPI Gilberto is a 75 year old, married male, nonsmoker, who comes in today for evaluation of hypertension, and shortness of breath.  His blood pressure today is 140/70, pulse 70 and regular on Norvasc 5 mg daily, Cozaar 100 mg daily.  He states for the past 3 years.  He said, shortness of breath with exertion, and it has not changed.  He is on  the following medicines by his pulmonologist, Dr. Mellody Dance, [place] , 2+ b.i.d., however, he is not on any other medications.  I recommend he go back and see Dr. Mellody Dance for follow-up.   Review of Systems general and cardiovascular review of systems otherwise negative   Objective:   Physical Exam    Well-developed well-nourished, male in no acute distress    Assessment & Plan:  ,Hypertension under good control,,,,,,,,, continue current medications follow up in the fall for his annual exam.  Shortness of breath with exertion.  Follow-up with Dr. Mellody Dance his pulmonologist

## 2010-11-03 NOTE — Patient Instructions (Signed)
Continue your current blood pressure medications.  Call Dr. Mellody Dance your pulmonologist, for an appointment for evaluation of the shortness of breath.  Set up an appointment in September for any new exam,,,,,,,,,,,,,, fasting labs one week prior

## 2010-11-17 ENCOUNTER — Telehealth: Payer: Self-pay | Admitting: Cardiology

## 2010-11-17 NOTE — Telephone Encounter (Signed)
Needs to clarify Plavix . Use reference number [id number]

## 2010-11-18 NOTE — Op Note (Signed)
NAME:  Nicholas Schmitt, Nicholas Schmitt NO.:  [id number]  MEDICAL RECORD NO.:  [id number]           PATIENT TYPE:  LOCATION:                                 FACILITY:  PHYSICIAN:  Kristine Garbe. Ezzard Standing, M.D.DATE OF BIRTH:  [birth year]  DATE OF PROCEDURE:  10/26/2010 DATE OF DISCHARGE:                              OPERATIVE REPORT   PREOPERATIVE DIAGNOSES:  Nasal fracture with severe septal deviation to the right with nasal obstruction, turbinate hypertrophy.  POSTOPERATIVE DIAGNOSES:  Nasal fracture with severe septal deviation to the right with nasal obstruction, turbinate hypertrophy.  OPERATION PERFORMED:  The open reduction of nasal septal fracture with bilateral inferior turbinate reductions.  SURGEON:  Kristine Garbe. Ezzard Standing, MD  ANESTHESIA:  General endotracheal.  COMPLICATIONS:  None.  BRIEF CLINICAL NOTE:  Nicholas Schmitt is a 75 year old gentleman who apparently broke his nose about 2 months ago.  He had some trouble breathing prior to the nasal trauma, but following the nasal trauma, he has had complete obstruction of the right nasal airway and has some difficulty on the left side.  On review of the CT scan, he had a nasal septal fracture with severe deviation of the septum to the right.  He has been using over-the-counter decongestant spray in order to breathe at night on a regular basis.  He was taken to the operating room at this time for open reduction nasal septal fracture and turbinate reductions to help improve his breathing.  DESCRIPTION OF PROCEDURE:  After adequate endotracheal anesthesia, nose was prepped with Betadine solution and draped out with sterile towels. The patient received 1 gram Ancef IV preoperatively.  Nose was then injected with Xylocaine With Epinephrine for local anesthetic to the septum and inferior turbinates.  A hemitransfixion incision was made along the caudal edge of septum on the right side.  Mucoperichondrial and  mucoperiosteal flaps were elevated posteriorly.  At the junction of the cartilaginous and bony septum, the septum had severe deviation to the right.  A vertical incision was made through the cartilage just anterior to the bony septum and mucoperichondrial and mucoperiosteal flaps were elevated on either side posterior to this.  Then the portion of the bony septum that was protruding to the right airway was removed. In addition, a small portion of the inferior maxillary crest that protruded to the right was also removed.  This allowed the remaining septum removed more toward midline, especially anterior cartilaginous septum.  Following this, inferior turbinate reductions were performed on the right side.  Incision was made along the inferior edge of turbinate and mucosa was elevated off the turbinate bone and turbinate bone was removed and suction cautery was used to produce submucosal cauterization of the turbinate mucosa.  Remaining turbinate was outfractured.  On the left side which was a larger turbinate, incision was made along the inferior edge of the turbinate.  Mucosa was elevated up off the medial aspect of the turbinate bone and the turbinate bone and some of the lateral turbinate mucosa was amputated.  The remaining turbinate was outfractured and cauterized for hemostasis.  This completed the procedure.  The hemitransfixion incision was closed  with interrupted 4-0 chromic suture.  The septum was basted anteriorly with 3-0 chromic suture.  Because of the patient's concerns about breathing at night, nasal cannula of medium size were obtained from anesthesia and the nose was packed with Telfa.  In addition prior to closing the nose, the butter knife was used to elevate the right nasal bone which had been fractured and depressed medially.  After elevation of the turbinate bone, the nose was packed with Telfa soaked in bacitracin ointment, placed superiorly within the nose bilaterally  and then the nasal cannulas were passed along the floor of the nose on either side in order to allow to be able to breathe at night despite packing the nose.  These were secured with a 3-0 silk suture.  Jakevious was awoken from anesthesia and transferred to the recovery room, postop doing well.  DISPOSITION:  Gohan discharged home later this morning on Keflex 500 mg b.i.d. for 1 week, Tylenol and Vicodin 1-2 q.4 h. p.r.n. pain.  Lestat will follow up with my office tomorrow morning to have his nasal packing removed and nasal cannulas removed.          ______________________________ Kristine Garbe Ezzard Standing, M.D.     CEN/MEDQ  D:  10/26/2010  T:  10/26/2010  Job:  [id number]  cc:   Tinnie Gens A. Tawanna Cooler, MD  Electronically Signed by Dillard Cannon M.D. on 11/18/2010 11:16:01 AM

## 2010-11-23 ENCOUNTER — Telehealth: Payer: Self-pay | Admitting: Cardiology

## 2010-11-23 NOTE — Telephone Encounter (Signed)
Ref# [id number]  Need clarification on Rx mailed in for plavix

## 2010-11-25 ENCOUNTER — Telehealth: Payer: Self-pay | Admitting: Family Medicine

## 2010-11-25 NOTE — Telephone Encounter (Signed)
Please send a new rx for generic plavix to [facility] [address].

## 2010-11-26 NOTE — Telephone Encounter (Signed)
Pt called and said to cx his order for refill on plavix. Pt has rcv med through [facility].

## 2010-12-21 ENCOUNTER — Ambulatory Visit: Payer: Medicare Other | Admitting: Family Medicine

## 2010-12-27 ENCOUNTER — Ambulatory Visit (INDEPENDENT_AMBULATORY_CARE_PROVIDER_SITE_OTHER): Payer: Medicare Other | Admitting: Family Medicine

## 2010-12-27 ENCOUNTER — Encounter: Payer: Self-pay | Admitting: Family Medicine

## 2010-12-27 VITALS — BP 160/90 | Temp 97.9°F | Wt 222.0 lb

## 2010-12-27 DIAGNOSIS — I10 Essential (primary) hypertension: Secondary | ICD-10-CM

## 2010-12-27 MED ORDER — AMLODIPINE BESYLATE 10 MG PO TABS: 10.0000 mg | ORAL_TABLET | Freq: Every day | ORAL | Status: DC

## 2010-12-27 NOTE — Patient Instructions (Signed)
Increase the Norvasc to 10 mg daily.  Follow-up in 3 weeks.  Check your blood pressure daily in the morning.

## 2010-12-27 NOTE — Progress Notes (Signed)
  Subjective:    Patient ID: Nicholas Schmitt, male    DOB: 12-Mar-1936, 75 y.o.   MRN: [id number]  Nicholas Schmitt is a 75 year old male, who comes in today for evaluation of hypertension.  He takes Norvasc 5 mg daily and Cozaar 100 mg daily.  He takes his medication daily, however, about two weeks ago.  He is blood pressure was elevated.  He's been checking it daily since then and is running in the 160/90 range.  We checked it here and at the same.  Again he's been compliant with his medication.  The only new medication.  He is taking is a blood thinner, Plavix 75 mg daily    Review of Systems General and cardiovascular review of systems otherwise negative    Objective:   Physical Exam    Well-developed well-nourished man in no acute distress.  BP 160/90, pulse 70 and regular    Assessment & Plan:  Hypertension increase Norvasc to 10 mg daily, keep the O. Sartin at 100 mg daily for BP check.  Daily follow-up in 3 weeks

## 2011-01-18 ENCOUNTER — Encounter: Payer: Self-pay | Admitting: Family Medicine

## 2011-01-18 ENCOUNTER — Ambulatory Visit (INDEPENDENT_AMBULATORY_CARE_PROVIDER_SITE_OTHER): Payer: Medicare Other | Admitting: Family Medicine

## 2011-01-18 DIAGNOSIS — I10 Essential (primary) hypertension: Secondary | ICD-10-CM

## 2011-01-18 NOTE — Progress Notes (Signed)
  Subjective:    Patient ID: Nicholas Schmitt, male    DOB: [birth year], 75 y.o.   MRN: [id number]  HPI Nicholas Schmitt is a 75 year old male, who comes in today for reevaluation of hypertension.  We increased his Norvasc from 5 mg a day to 10 and maintained his Cozaar at 100 mg daily.  BP right arm sitting position 135/70.  He complaining about any irritation of his eyes.  Referred to his optometrist Dr. Lawerance Bach   Review of Systems     Objective:   Physical Exam Well-developed and nourished, male in no acute distress.  BP right arm sitting position 135/70, pulse 70 and regular       Assessment & Plan:  Hypertension adult continue current therapy.  Follow-up in October and your exam

## 2011-01-18 NOTE — Patient Instructions (Signed)
Continue your current medications.  Follow-up in October for your annual physical exam.  Check your blood pressure weekly

## 2011-02-03 ENCOUNTER — Telehealth: Payer: Self-pay | Admitting: *Deleted

## 2011-02-03 NOTE — Telephone Encounter (Signed)
Pt called stating that he is having elbow pain and needs a name of a ortho.

## 2011-02-03 NOTE — Telephone Encounter (Signed)
SMO c,,,,,,,,,,,, [facility]

## 2011-02-04 NOTE — Telephone Encounter (Signed)
Left message on machine for patient  On cell number

## 2011-02-04 NOTE — Telephone Encounter (Signed)
Tried to call number but it is disconnected.

## 2011-02-28 ENCOUNTER — Encounter: Payer: Self-pay | Admitting: *Deleted

## 2011-03-01 ENCOUNTER — Ambulatory Visit: Payer: MEDICARE | Admitting: Pulmonary Disease

## 2011-03-08 ENCOUNTER — Ambulatory Visit: Payer: Self-pay | Admitting: Pulmonary Disease

## 2011-03-14 ENCOUNTER — Other Ambulatory Visit: Payer: Medicare Other

## 2011-03-21 ENCOUNTER — Encounter: Payer: Self-pay | Admitting: Family Medicine

## 2011-03-21 ENCOUNTER — Ambulatory Visit (INDEPENDENT_AMBULATORY_CARE_PROVIDER_SITE_OTHER): Payer: Medicare Other | Admitting: Family Medicine

## 2011-03-21 DIAGNOSIS — I2510 Atherosclerotic heart disease of native coronary artery without angina pectoris: Secondary | ICD-10-CM

## 2011-03-21 DIAGNOSIS — E785 Hyperlipidemia, unspecified: Secondary | ICD-10-CM

## 2011-03-21 DIAGNOSIS — J45909 Unspecified asthma, uncomplicated: Secondary | ICD-10-CM

## 2011-03-21 DIAGNOSIS — I714 Abdominal aortic aneurysm, without rupture: Secondary | ICD-10-CM

## 2011-03-21 DIAGNOSIS — H900 Conductive hearing loss, bilateral: Secondary | ICD-10-CM

## 2011-03-21 DIAGNOSIS — Z136 Encounter for screening for cardiovascular disorders: Secondary | ICD-10-CM

## 2011-03-21 DIAGNOSIS — Z125 Encounter for screening for malignant neoplasm of prostate: Secondary | ICD-10-CM

## 2011-03-21 DIAGNOSIS — Z0000 Encounter for general adult medical examination without abnormal findings: Secondary | ICD-10-CM

## 2011-03-21 DIAGNOSIS — I10 Essential (primary) hypertension: Secondary | ICD-10-CM

## 2011-03-21 DIAGNOSIS — C61 Malignant neoplasm of prostate: Secondary | ICD-10-CM

## 2011-03-21 LAB — HEPATIC FUNCTION PANEL
Bilirubin, Direct: 0.2 mg/dL (ref 0.0–0.3)
Total Bilirubin: 1.3 mg/dL — ABNORMAL HIGH (ref 0.3–1.2)

## 2011-03-21 LAB — CBC WITH DIFFERENTIAL/PLATELET
Basophils Absolute: 0 10*3/uL (ref 0.0–0.1)
Eosinophils Absolute: 0.3 10*3/uL (ref 0.0–0.7)
MCHC: 33.5 g/dL (ref 30.0–36.0)
MCV: 94 fl (ref 78.0–100.0)
Monocytes Absolute: 0.5 10*3/uL (ref 0.1–1.0)
Neutrophils Relative %: 50.1 % (ref 43.0–77.0)
Platelets: 146 10*3/uL — ABNORMAL LOW (ref 150.0–400.0)
WBC: 4.1 10*3/uL — ABNORMAL LOW (ref 4.5–10.5)

## 2011-03-21 LAB — LIPID PANEL
HDL: 37.7 mg/dL — ABNORMAL LOW (ref >39.00)
LDL Cholesterol: 77 mg/dL (ref 0–99)
Total CHOL/HDL Ratio: 3
Triglycerides: 82 mg/dL (ref 0.0–149.0)

## 2011-03-21 LAB — BASIC METABOLIC PANEL
BUN: 15 mg/dL (ref 6–23)
CO2: 27 mEq/L (ref 19–32)
Chloride: 107 mEq/L (ref 96–112)
Creatinine, Ser: 0.8 mg/dL (ref 0.4–1.5)

## 2011-03-21 LAB — POCT URINALYSIS DIPSTICK
Blood, UA: NEGATIVE
Nitrite, UA: NEGATIVE
Urobilinogen, UA: 1
pH, UA: 6

## 2011-03-21 LAB — PSA: PSA: 1.83 ng/mL (ref 0.10–4.00)

## 2011-03-21 MED ORDER — CLOPIDOGREL BISULFATE 75 MG PO TABS
75.0000 mg | ORAL_TABLET | Freq: Every day | ORAL | Status: DC
Start: 2011-03-21 — End: 2011-12-16

## 2011-03-21 MED ORDER — ALBUTEROL (5 MG/ML) CONTINUOUS INHALATION SOLN: 5.0000 mg/h | INHALATION_SOLUTION | RESPIRATORY_TRACT | Status: DC | PRN

## 2011-03-21 MED ORDER — ATORVASTATIN CALCIUM 40 MG PO TABS: 40.0000 mg | ORAL_TABLET | Freq: Every day | ORAL | Status: DC

## 2011-03-21 MED ORDER — BUDESONIDE-FORMOTEROL FUMARATE 160-4.5 MCG/ACT IN AERO: 2.0000 | INHALATION_SPRAY | Freq: Two times a day (BID) | RESPIRATORY_TRACT | Status: DC

## 2011-03-21 MED ORDER — NITROGLYCERIN 0.4 MG SL SUBL: 0.4000 mg | SUBLINGUAL_TABLET | SUBLINGUAL | Status: DC | PRN

## 2011-03-21 MED ORDER — LOSARTAN POTASSIUM 100 MG PO TABS
100.0000 mg | ORAL_TABLET | Freq: Every day | ORAL | Status: DC
Start: 2011-03-21 — End: 2011-12-12

## 2011-03-21 MED ORDER — AMLODIPINE BESYLATE 10 MG PO TABS: 10.0000 mg | ORAL_TABLET | Freq: Every day | ORAL | Status: DC

## 2011-03-21 MED ORDER — ALBUTEROL 90 MCG/ACT IN AERS: 2.0000 | INHALATION_SPRAY | Freq: Four times a day (QID) | RESPIRATORY_TRACT | Status: DC | PRN

## 2011-03-21 NOTE — Patient Instructions (Signed)
Continue your current medications.  Follow-up in one year for annual evaluation, sooner if any problems

## 2011-03-21 NOTE — Progress Notes (Signed)
Subjective:    Patient ID: Nicholas Schmitt, male    DOB: 02-01-1936, 75 y.o.   MRN: [id number]  HPIRon Is a 75 year old, married male ex smoker, who comes in today for Medicare wellness examination because of a history of COPD secondary to smoking, hypertension, coronary disease, hypertension, hyperlipidemia, and hearing loss.  His COPD is treated with an inhaled steroid, two puffs b.i.d., albuterol p.r.n. With a nebulizer.  His coronary disease is stable.  He takes Plavix 75 mg daily.  For hypertension.  He takes Norvasc 10 mg daily and losartan. 100 mg daily.  BP 118/70.  For hyperlipidemia.  He takes Zocor 80 mg daily...Marland KitchenMarland KitchenMarland Kitchen Was switched to Lipitor 40  Alona Bene gets a new bottle of nitroglycerin once year because of a history of coronary disease, however, he's not had to use it.  He also takes an 81-mg baby aspirin.  Pneumovax 2010, tetanus, 2005, shingles 2009, colonoscopy, 2009, normal, seasonal flu shot 2012.  PH routine eye care, severe hearing loss, but declines a hearing evaluation, regular dental care, activities of daily living, normal, cognitive function, normal, home health safety reviewed.  No issues identified, no guns in the house.  He does have a healthcare power of attorney and living will.  He is to go back to see cardiology for follow-up of his coronary disease and a AAA.  Asymptomatic    Review of Systems  Constitutional: Negative.   HENT: Positive for hearing loss.   Eyes: Negative.   Respiratory: Negative.   Cardiovascular: Negative.   Gastrointestinal: Negative.   Genitourinary: Negative.   Musculoskeletal: Negative.        Bilateral shoulder pain.  No history of trauma  Skin: Negative.   Neurological: Negative.   Hematological: Negative.   Psychiatric/Behavioral: Negative.        Objective:   Physical Exam  Constitutional: He is oriented to person, place, and time. He appears well-developed and well-nourished.  HENT:  Head: Normocephalic and  atraumatic.  Right Ear: External ear normal.  Left Ear: External ear normal.  Nose: Nose normal.  Mouth/Throat: Oropharynx is clear and moist.  Eyes: Conjunctivae and EOM are normal. Pupils are equal, round, and reactive to light.  Neck: Normal range of motion. Neck supple. No JVD present. No tracheal deviation present. No thyromegaly present.  Cardiovascular: Normal rate, regular rhythm, normal heart sounds and intact distal pulses.  Exam reveals no gallop and no friction rub.   No murmur heard.      No carotid bruits  Pulmonary/Chest: Effort normal and breath sounds normal. No stridor. No respiratory distress. He has no wheezes. He has no rales. He exhibits no tenderness.  Abdominal: Soft. Bowel sounds are normal. He exhibits no distension and no mass. There is no tenderness. There is no rebound and no guarding.       No abdominal bruit  Genitourinary: Rectum normal and penis normal. Guaiac negative stool. No penile tenderness.       Prostate has been surgically removed for prostate cancer  Musculoskeletal: Normal range of motion. He exhibits no edema and no tenderness.  Lymphadenopathy:    He has no cervical adenopathy.  Neurological: He is alert and oriented to person, place, and time. He has normal reflexes. No cranial nerve deficit. He exhibits normal muscle tone.  Skin: Skin is warm and dry. No rash noted. No erythema. No pallor.  Psychiatric: He has a normal mood and affect. His behavior is normal. Judgment and thought content normal.  Assessment & Plan:  Healthy male.  COPD continue medication.  Hypertension.  Continue medication.  Coronary disease.  Continue current medication.  Hyperlipidemia change in Zocor 80 Lipitor 40  History AAA and coronary disease.  Follow-up with cardiology.  This fall.  Return in one year or sooner if any problems

## 2011-04-05 ENCOUNTER — Telehealth: Payer: Self-pay | Admitting: Family Medicine

## 2011-04-05 MED ORDER — ALBUTEROL SULFATE HFA 108 (90 BASE) MCG/ACT IN AERS: 2.0000 | INHALATION_SPRAY | Freq: Four times a day (QID) | RESPIRATORY_TRACT | Status: DC | PRN

## 2011-04-05 NOTE — Telephone Encounter (Signed)
Copy mailed to home address 

## 2011-04-05 NOTE — Telephone Encounter (Signed)
Pt requesting to be mailed the results of cpx labs

## 2011-04-21 ENCOUNTER — Other Ambulatory Visit: Payer: Self-pay | Admitting: Cardiology

## 2011-04-21 DIAGNOSIS — I714 Abdominal aortic aneurysm, without rupture: Secondary | ICD-10-CM

## 2011-04-25 ENCOUNTER — Encounter (INDEPENDENT_AMBULATORY_CARE_PROVIDER_SITE_OTHER): Payer: Medicare Other | Admitting: Cardiology

## 2011-04-25 DIAGNOSIS — I714 Abdominal aortic aneurysm, without rupture: Secondary | ICD-10-CM

## 2011-05-02 NOTE — Progress Notes (Signed)
Quick Note:  Pt informed ______ 

## 2011-07-12 ENCOUNTER — Ambulatory Visit (INDEPENDENT_AMBULATORY_CARE_PROVIDER_SITE_OTHER): Payer: Medicare Other | Admitting: Family Medicine

## 2011-07-12 ENCOUNTER — Encounter: Payer: Self-pay | Admitting: Family Medicine

## 2011-07-12 VITALS — BP 148/70 | Temp 97.9°F | Wt 230.0 lb

## 2011-07-12 DIAGNOSIS — I499 Cardiac arrhythmia, unspecified: Secondary | ICD-10-CM

## 2011-07-12 DIAGNOSIS — I10 Essential (primary) hypertension: Secondary | ICD-10-CM

## 2011-07-12 NOTE — Progress Notes (Signed)
  Subjective:    Patient ID: Nicholas Schmitt, male    DOB: September 03, 1935, 76 y.o.   MRN: [id number]  HPI [name] is a 76 year old male X. smoker who comes in today for evaluation of hypertension  He states his blood pressure is 160-170 systolic with a diastolic of 90. BP here 150/70 however his pulse is very irregular EKG pending. He states he is compliant with his medication. It sounds like his digital blood pressure cuff at home is malfunctioning.   Review of Systems    general and cardiac review of systems otherwise negative Objective:   Physical Exam Well-developed well-nourished male in no acute distress BP right arm sitting position 150/70 pulse was 70 and irregular       Assessment & Plan:  Hypertension not at goal plan continue current therapy BP check every morning followup in 2 weeks avoid salt walk 20 minutes a day

## 2011-07-12 NOTE — Patient Instructions (Signed)
Continue your current blood pressure medication  Check your blood pressure daily in the morning at home   Stay on a salt free diet and walk 20 minutes daily  Return in 2 weeks with the data and the device

## 2011-07-26 ENCOUNTER — Ambulatory Visit: Payer: Medicare Other | Admitting: Family Medicine

## 2011-12-12 ENCOUNTER — Other Ambulatory Visit: Payer: Self-pay | Admitting: *Deleted

## 2011-12-12 DIAGNOSIS — I10 Essential (primary) hypertension: Secondary | ICD-10-CM

## 2011-12-12 MED ORDER — LOSARTAN POTASSIUM 100 MG PO TABS: 100.0000 mg | ORAL_TABLET | Freq: Every day | ORAL | Status: DC

## 2011-12-16 ENCOUNTER — Telehealth: Payer: Self-pay | Admitting: Family Medicine

## 2011-12-16 DIAGNOSIS — I2510 Atherosclerotic heart disease of native coronary artery without angina pectoris: Secondary | ICD-10-CM

## 2011-12-16 MED ORDER — CLOPIDOGREL BISULFATE 75 MG PO TABS: 75.0000 mg | ORAL_TABLET | Freq: Every day | ORAL | Status: DC

## 2011-12-16 NOTE — Telephone Encounter (Signed)
Patient called stating that he called in to have a refill of his clopidegrel and received losartan instead. Please assist.

## 2011-12-27 ENCOUNTER — Other Ambulatory Visit: Payer: Self-pay | Admitting: *Deleted

## 2011-12-27 DIAGNOSIS — I10 Essential (primary) hypertension: Secondary | ICD-10-CM

## 2011-12-27 MED ORDER — AMLODIPINE BESYLATE 10 MG PO TABS: 10.0000 mg | ORAL_TABLET | Freq: Every day | ORAL | Status: DC

## 2012-03-08 ENCOUNTER — Encounter: Payer: Self-pay | Admitting: Family Medicine

## 2012-03-08 ENCOUNTER — Ambulatory Visit (INDEPENDENT_AMBULATORY_CARE_PROVIDER_SITE_OTHER): Payer: Medicare Other | Admitting: Family Medicine

## 2012-03-08 VITALS — BP 160/80 | Temp 98.0°F | Wt 219.0 lb

## 2012-03-08 DIAGNOSIS — I10 Essential (primary) hypertension: Secondary | ICD-10-CM

## 2012-03-08 DIAGNOSIS — C61 Malignant neoplasm of prostate: Secondary | ICD-10-CM

## 2012-03-08 DIAGNOSIS — Z23 Encounter for immunization: Secondary | ICD-10-CM

## 2012-03-08 DIAGNOSIS — E785 Hyperlipidemia, unspecified: Secondary | ICD-10-CM

## 2012-03-08 DIAGNOSIS — I2510 Atherosclerotic heart disease of native coronary artery without angina pectoris: Secondary | ICD-10-CM

## 2012-03-08 LAB — CBC WITH DIFFERENTIAL/PLATELET
Basophils Relative: 0.2 % (ref 0.0–3.0)
Eosinophils Absolute: 0 10*3/uL (ref 0.0–0.7)
Eosinophils Relative: 0 % (ref 0.0–5.0)
Hemoglobin: 13 g/dL (ref 13.0–17.0)
MCHC: 32.9 g/dL (ref 30.0–36.0)
MCV: 95.3 fl (ref 78.0–100.0)
Monocytes Absolute: 0.7 10*3/uL (ref 0.1–1.0)
Neutro Abs: 8.7 10*3/uL — ABNORMAL HIGH (ref 1.4–7.7)
Neutrophils Relative %: 83.3 % — ABNORMAL HIGH (ref 43.0–77.0)
RBC: 4.15 Mil/uL — ABNORMAL LOW (ref 4.22–5.81)
WBC: 10.4 10*3/uL (ref 4.5–10.5)

## 2012-03-08 LAB — HEPATIC FUNCTION PANEL
ALT: 26 U/L (ref 0–53)
AST: 24 U/L (ref 0–37)
Total Bilirubin: 1.6 mg/dL — ABNORMAL HIGH (ref 0.3–1.2)

## 2012-03-08 LAB — BASIC METABOLIC PANEL
CO2: 25 mEq/L (ref 19–32)
Chloride: 107 mEq/L (ref 96–112)
Glucose, Bld: 106 mg/dL — ABNORMAL HIGH (ref 70–99)
Sodium: 141 mEq/L (ref 135–145)

## 2012-03-08 LAB — LIPID PANEL
Cholesterol: 135 mg/dL (ref 0–200)
HDL: 42.6 mg/dL (ref >39.00)

## 2012-03-08 LAB — TSH: TSH: 2.1 u[IU]/mL (ref 0.35–5.50)

## 2012-03-08 NOTE — Progress Notes (Signed)
  Subjective:    Patient ID: Nicholas Schmitt, male    DOB: Apr 19, 1936, 76 y.o.   MRN: [id number]  HPI Nicholas Schmitt is a 77 year old married male nonsmoker who comes in today because of diffuse myalgias and arthralgias for one year  He states his had muscle and joint pain for a year. He's been to see orthopedics and a chiropractor and nobody can help his pain. He thinks it may be a side effect from his cholesterol-lowering medication. He does have a history of underlying coronary artery disease he's got a stent he's got hyperlipidemia and hypertension.  We discussed various options he elects to stop the Lipitor to see if his symptoms won't ameliorate   Review of Systems General musculoskeletal neurologic review of systems otherwise negative    Objective:   Physical Exam Well-developed well-nourished male in no acute distress vital signs normal except for BP 160/80. He states his blood pressure at home is normal  Musculoskeletal exam normal       Assessment & Plan:

## 2012-03-08 NOTE — Patient Instructions (Signed)
Stop the Lipitor  Labs today  Return for your CPX

## 2012-03-09 LAB — POCT URINALYSIS DIPSTICK
Blood, UA: NEGATIVE
Glucose, UA: NEGATIVE
Protein, UA: NEGATIVE
Spec Grav, UA: 1.025
Urobilinogen, UA: 0.2

## 2012-03-13 ENCOUNTER — Other Ambulatory Visit: Payer: Medicare Other

## 2012-03-14 ENCOUNTER — Other Ambulatory Visit: Payer: Medicare Other

## 2012-03-21 ENCOUNTER — Encounter: Payer: Self-pay | Admitting: Family Medicine

## 2012-03-21 ENCOUNTER — Ambulatory Visit (INDEPENDENT_AMBULATORY_CARE_PROVIDER_SITE_OTHER): Payer: Medicare Other | Admitting: Family Medicine

## 2012-03-21 VITALS — BP 140/80 | HR 72 | Temp 98.2°F | Resp 16 | Ht 72.0 in | Wt 221.0 lb

## 2012-03-21 DIAGNOSIS — H900 Conductive hearing loss, bilateral: Secondary | ICD-10-CM

## 2012-03-21 DIAGNOSIS — J45909 Unspecified asthma, uncomplicated: Secondary | ICD-10-CM

## 2012-03-21 DIAGNOSIS — C61 Malignant neoplasm of prostate: Secondary | ICD-10-CM

## 2012-03-21 DIAGNOSIS — I10 Essential (primary) hypertension: Secondary | ICD-10-CM

## 2012-03-21 DIAGNOSIS — J449 Chronic obstructive pulmonary disease, unspecified: Secondary | ICD-10-CM

## 2012-03-21 DIAGNOSIS — E785 Hyperlipidemia, unspecified: Secondary | ICD-10-CM

## 2012-03-21 DIAGNOSIS — I714 Abdominal aortic aneurysm, without rupture: Secondary | ICD-10-CM

## 2012-03-21 DIAGNOSIS — I2510 Atherosclerotic heart disease of native coronary artery without angina pectoris: Secondary | ICD-10-CM

## 2012-03-21 MED ORDER — ATORVASTATIN CALCIUM 40 MG PO TABS: 40.0000 mg | ORAL_TABLET | Freq: Every day | ORAL | Status: DC

## 2012-03-21 MED ORDER — CLOPIDOGREL BISULFATE 75 MG PO TABS: 75.0000 mg | ORAL_TABLET | Freq: Every day | ORAL | Status: DC

## 2012-03-21 MED ORDER — BUDESONIDE-FORMOTEROL FUMARATE 160-4.5 MCG/ACT IN AERO: INHALATION_SPRAY | RESPIRATORY_TRACT | Status: DC

## 2012-03-21 MED ORDER — LOSARTAN POTASSIUM 100 MG PO TABS: 100.0000 mg | ORAL_TABLET | Freq: Every day | ORAL | Status: DC

## 2012-03-21 MED ORDER — NITROGLYCERIN 0.4 MG SL SUBL: 0.4000 mg | SUBLINGUAL_TABLET | SUBLINGUAL | Status: DC | PRN

## 2012-03-21 MED ORDER — AMLODIPINE BESYLATE 10 MG PO TABS: 10.0000 mg | ORAL_TABLET | Freq: Every day | ORAL | Status: DC

## 2012-03-21 NOTE — Patient Instructions (Signed)
Take one puff of the Symbicort twice daily  Continue your other medications  Use ear drops nightly for 2 weeks and return in 2 weeks to have ears cleaned out

## 2012-03-21 NOTE — Progress Notes (Signed)
  Subjective:    Patient ID: Nicholas Schmitt, male    DOB: 07-Dec-1935, 76 y.o.   MRN: [id number]  HPI Nicholas Schmitt is a 76 year old single male X. smoker who comes in today for a Medicare wellness examination because of a history of hypertension, hyperlipidemia, coronary disease,,,,,,,,,, PTCA 2006,,,,,,, prostate cancer status post prostatectomy, hearing loss and underlying COPD  His medications reviewed in detail and there've been no changes except he does not take his Symbicort on a daily basis. He was advised to take it daily however he states he only takes it if he needs it  He gets routine eye care, dental care, colonoscopy and GI, tetanus 2005, Pneumovax 2010, seasonal flu 2013, shingles vaccine in 2009.  Cognitive function normal he walks on a regular basis pulmonal safety reviewed no issues identified, no guns in the house, he does have a health care power of attorney and living will   Review of Systems  Constitutional: Negative.   HENT: Negative.   Eyes: Negative.   Respiratory: Negative.   Cardiovascular: Negative.   Gastrointestinal: Negative.   Genitourinary: Negative.   Musculoskeletal: Negative.   Skin: Negative.   Neurological: Negative.   Hematological: Negative.   Psychiatric/Behavioral: Negative.        Objective:   Physical Exam  Constitutional: He is oriented to person, place, and time. He appears well-developed and well-nourished.  HENT:  Head: Normocephalic and atraumatic.  Right Ear: External ear normal.  Left Ear: External ear normal.  Nose: Nose normal.  Mouth/Throat: Oropharynx is clear and moist.       Bilateral cerumen impactions  Eyes: Conjunctivae normal and EOM are normal. Pupils are equal, round, and reactive to light.  Neck: Normal range of motion. Neck supple. No JVD present. No tracheal deviation present. No thyromegaly present.  Cardiovascular: Normal rate, regular rhythm and intact distal pulses.  Exam reveals no gallop and no friction rub.     No murmur heard.      No carotid bruits distal pulses 1+  Pulmonary/Chest: Effort normal. No stridor. No respiratory distress. He has no wheezes. He has no rales. He exhibits no tenderness.       Barrel-shaped chest and decreased breath sounds consistent with COPD  Abdominal: Soft. Bowel sounds are normal. He exhibits no distension and no mass. There is no tenderness. There is no rebound and no guarding.  Genitourinary: Rectum normal and penis normal. Guaiac negative stool. No penile tenderness.       Prostate surgically removed guaiac negative  Musculoskeletal: Normal range of motion. He exhibits no edema and no tenderness.  Lymphadenopathy:    He has no cervical adenopathy.  Neurological: He is alert and oriented to person, place, and time. He has normal reflexes. No cranial nerve deficit. He exhibits normal muscle tone.  Skin: Skin is warm and dry. No rash noted. No erythema. No pallor.  Psychiatric: He has a normal mood and affect. His behavior is normal. Judgment and thought content normal.          Assessment & Plan:  Coronary disease continue current medication  Hyperlipidemia continue Lipitor 40 mg daily and an aspirin tablet  Hypertension continue Norvasc and Cozaar  COPD again encouraged to use the Symbicort 1 puff twice daily  History of prostate cancer status post prostatectomy  Profound hearing loss partially related to bilateral cerumen impactions advised to use ear complex dissolving drops and return in 2 weeks for followup

## 2012-04-09 ENCOUNTER — Ambulatory Visit: Payer: Medicare Other | Admitting: Family Medicine

## 2012-05-15 ENCOUNTER — Telehealth: Payer: Self-pay | Admitting: Family Medicine

## 2012-05-15 NOTE — Telephone Encounter (Signed)
Per Dr Tawanna Cooler patient should try Imodium.  Patient is aware.

## 2012-05-15 NOTE — Telephone Encounter (Signed)
Patient Information:  Caller Name: Nicholas Schmitt  Phone: [phone]  Patient: Nicholas Schmitt, Nicholas Schmitt  Gender: Male  DOB: December 11, 1935  Age: 76 Years  PCP: Kelle Darting [facility])   Symptoms  Reason For Call & Symptoms: Leaking stool from rectum and having to wear a diaper  Reviewed Health History In EMR: Yes  Reviewed Medications In EMR: Yes  Reviewed Allergies In EMR: Yes  Reviewed Surgeries / Procedures: Yes  Date of Onset of Symptoms: 05/06/2012  Treatments Tried: Lomotil for 8 days given to him by a friend  Treatments Tried Worked: Yes  Guideline(s) Used:  Diarrhea  No Protocol Available - Sick Adult  Disposition Per Guideline:   Discuss with PCP and Callback by Nurse within 1 Hour  Reason For Disposition Reached:   Nursing judgment  Advice Given:  N/A  Office Follow Up:  Does the office need to follow up with this patient?: Yes  Instructions For The Office: Please follow up with script request or office appointment if patient has not been previously evaluated as patient stated he was.  Patient Refused Recommendation:  Patient Requests Prescription  Patient would like a prescription called in for Lomotil  RN Note:  Patient states that he has been previously evaluated in the office for these symptoms and nothing was given for treatment and he would like a message sent to see if Dr. Tawanna Cooler would recommend and prescribe Lomotil for his symptoms.  No documentation in Epic to support previous evaluation.

## 2012-05-18 ENCOUNTER — Telehealth: Payer: Self-pay | Admitting: Cardiology

## 2012-05-18 NOTE — Telephone Encounter (Signed)
Left a message to call back.

## 2012-05-18 NOTE — Telephone Encounter (Signed)
New Problem:     Patient called in confused about a call.  Patient was not paying attenttion and deleted his voicemail before he heard the message.  Please call back.

## 2012-05-18 NOTE — Telephone Encounter (Signed)
Routed correctly.  Please call back.

## 2012-05-21 ENCOUNTER — Telehealth: Payer: Self-pay | Admitting: Cardiology

## 2012-05-21 NOTE — Telephone Encounter (Signed)
Pt rtn call from Friday, pls call [phone]

## 2012-05-21 NOTE — Telephone Encounter (Signed)
Pt is aware that I did not call him Friday.

## 2012-05-21 NOTE — Telephone Encounter (Signed)
Spoke with pt. I did not call patient 05/18/12. Pt will check his voice mail and call back.

## 2012-05-21 NOTE — Telephone Encounter (Signed)
F/U   Returning call back to nurse.   

## 2012-05-24 ENCOUNTER — Telehealth: Payer: Self-pay | Admitting: Family Medicine

## 2012-05-24 DIAGNOSIS — I10 Essential (primary) hypertension: Secondary | ICD-10-CM

## 2012-05-24 DIAGNOSIS — E785 Hyperlipidemia, unspecified: Secondary | ICD-10-CM

## 2012-05-24 DIAGNOSIS — I2510 Atherosclerotic heart disease of native coronary artery without angina pectoris: Secondary | ICD-10-CM

## 2012-05-24 NOTE — Telephone Encounter (Addendum)
Pt needs refill of the following meds. (Pt states for 12 months)  Pt would like to send the scripts in himself so pls call him when ready.  [phone] amLODipine 10mg  Losartan 100mg  clopidogrel 75 mg Atorvastatin 40mg 

## 2012-05-28 MED ORDER — LOSARTAN POTASSIUM 100 MG PO TABS: 100.0000 mg | ORAL_TABLET | Freq: Every day | ORAL | Status: DC

## 2012-05-28 MED ORDER — AMLODIPINE BESYLATE 10 MG PO TABS: 10.0000 mg | ORAL_TABLET | Freq: Every day | ORAL | Status: DC

## 2012-05-28 MED ORDER — ATORVASTATIN CALCIUM 40 MG PO TABS: 40.0000 mg | ORAL_TABLET | Freq: Every day | ORAL | Status: DC

## 2012-05-28 MED ORDER — CLOPIDOGREL BISULFATE 75 MG PO TABS: 75.0000 mg | ORAL_TABLET | Freq: Every day | ORAL | Status: DC

## 2012-05-28 NOTE — Telephone Encounter (Signed)
Rx ready for pick up and patient is aware 

## 2012-06-13 DIAGNOSIS — Z950 Presence of cardiac pacemaker: Secondary | ICD-10-CM

## 2012-06-13 HISTORY — DX: Presence of cardiac pacemaker: Z95.0

## 2013-01-23 ENCOUNTER — Telehealth: Payer: Self-pay | Admitting: Family Medicine

## 2013-01-23 NOTE — Telephone Encounter (Signed)
Left message on machine for patient to call back and make an appointment

## 2013-01-23 NOTE — Telephone Encounter (Signed)
Pt states his nerves are bad and wanted Dr Tawanna Cooler to call him something in.  Advised pt he needed to make appt.

## 2013-01-24 ENCOUNTER — Ambulatory Visit: Payer: Medicare Other | Admitting: Family Medicine

## 2013-01-28 ENCOUNTER — Encounter: Payer: Self-pay | Admitting: Family

## 2013-01-28 ENCOUNTER — Ambulatory Visit (INDEPENDENT_AMBULATORY_CARE_PROVIDER_SITE_OTHER): Payer: Self-pay | Admitting: Family

## 2013-01-28 VITALS — BP 120/74 | HR 71 | Wt 219.0 lb

## 2013-01-28 DIAGNOSIS — I10 Essential (primary) hypertension: Secondary | ICD-10-CM

## 2013-01-28 DIAGNOSIS — F411 Generalized anxiety disorder: Secondary | ICD-10-CM

## 2013-01-28 DIAGNOSIS — E785 Hyperlipidemia, unspecified: Secondary | ICD-10-CM

## 2013-01-28 MED ORDER — PAROXETINE HCL 20 MG PO TABS: 20.0000 mg | ORAL_TABLET | ORAL | Status: DC

## 2013-01-28 NOTE — Patient Instructions (Addendum)

## 2013-01-28 NOTE — Progress Notes (Signed)
Subjective:    Patient ID: Nicholas Schmitt, male    DOB: 07-03-1935, 77 y.o.   MRN: [id number]  HPI 77 year old AAM, smoker, is in today with c/o increased anxiety, worsening temper, and worrying. Does not feel depressed. Denies any feelings of helplessness, hopelessness, thoughts of death an dying. Has a history of hypertension, hypercholesterolemia, CAD.   Review of Systems  Constitutional: Negative.   Respiratory: Negative.   Cardiovascular: Negative.   Musculoskeletal: Negative.   Skin: Negative.   Neurological: Negative.   Psychiatric/Behavioral: Positive for agitation. Negative for suicidal ideas and sleep disturbance. The patient is nervous/anxious.    Past Medical History  Diagnosis Date  . Hypertension   . CAD (coronary artery disease)     positive stress test in 2006 led to left heart cath (8/06) showing 95% prox RCA, 90% CFX, and 90% mLAD.  patient had a cypher DES to all 3 lesions  . Hyperlipidemia   . Hearing loss   . Cancer     prostate Tx with prostatectomy  . AAA (abdominal aortic aneurysm) 11/09    3.2 cm   . Asthma     History   Social History  . Marital Status: Divorced    Spouse Name: N/A    Number of Children: N/A  . Years of Education: N/A   Occupational History  . Not on file.   Social History Main Topics  . Smoking status: Former Games developer  . Smokeless tobacco: Not on file  . Alcohol Use: No  . Drug Use: No  . Sexual Activity: Not on file   Other Topics Concern  . Not on file   Social History Narrative  . No narrative on file    Past Surgical History  Procedure Laterality Date  . Prostatectomy  2005  . Ptca  2006    Family History  Problem Relation Age of Onset  . Asthma Sister   . Asthma Brother   . Coronary artery disease Other     No Known Allergies  Current Outpatient Prescriptions on File Prior to Visit  Medication Sig Dispense Refill  . albuterol (PROVENTIL,VENTOLIN) 90 MCG/ACT inhaler Inhale 2 puffs into the lungs  every 6 (six) hours as needed.  17 g  3  . amLODipine (NORVASC) 10 MG tablet Take 1 tablet (10 mg total) by mouth daily.  100 tablet  3  . aspirin 81 MG tablet Take 81 mg by mouth 2 (two) times daily.        [name] atorvastatin (LIPITOR) 40 MG tablet Take 1 tablet (40 mg total) by mouth daily.  100 tablet  3  . budesonide-formoterol (SYMBICORT) 160-4.5 MCG/ACT inhaler 1 puff twice daily  2 Inhaler  6  . clopidogrel (PLAVIX) 75 MG tablet Take 1 tablet (75 mg total) by mouth daily.  100 tablet  3  . fish oil-omega-3 fatty acids 1000 MG capsule Take 2 g by mouth daily.        [name] losartan (COZAAR) 100 MG tablet Take 1 tablet (100 mg total) by mouth daily.  100 tablet  3  . Multiple Vitamin (MULTIVITAMIN) tablet Take 1 tablet by mouth daily.        . nitroGLYCERIN (NITROSTAT) 0.4 MG SL tablet Place 1 tablet (0.4 mg total) under the tongue every 5 (five) minutes as needed.  25 tablet  1   No current facility-administered medications on file prior to visit.    BP 120/74  Pulse 71  Wt 219 lb (99.338 kg)  BMI 29.7 kg/m2chart    Objective:   Physical Exam  Constitutional: He is oriented to person, place, and time. He appears well-developed and well-nourished.  Neck: Normal range of motion. Neck supple. No thyromegaly present.  Cardiovascular: Normal rate, regular rhythm and normal heart sounds.   Pulmonary/Chest: Effort normal and breath sounds normal.  Neurological: He is alert and oriented to person, place, and time.  Skin: Skin is warm and dry.  Psychiatric: He has a normal mood and affect.          Assessment & Plan:  Assessment: 1. Anxiety 2. Hypertension 3. Hyperlipidemia  Plan: start paxil 10mg  once a day x 8 days then increase to 20mg  once a day. Recheck in 3 weeks. Call the office with any questions or concerns.

## 2013-02-04 ENCOUNTER — Telehealth: Payer: Self-pay | Admitting: Family Medicine

## 2013-02-04 NOTE — Telephone Encounter (Signed)
Patient Information:  Caller Name: Ronon  Phone: [phone]  Patient: Nicholas Schmitt, Nicholas Schmitt  Gender: Male  DOB: 1935/07/05  Age: 77 Years  PCP: Kelle Darting [facility])  Office Follow Up:  Does the office need to follow up with this patient?: Yes  Instructions For The Office: allergic reaction to Paxil per patient.  Please contact  RN Note:  Patient has stopped medication. Please contact patient regarding medication. Advised message will be sent to provider/.  Symptoms  Reason For Call & Symptoms: He states he "is breaking out all over" with a new medication. Patient was in office on 01/28/13 and was seen by Dr. Orvan Falconer.  He was RX= Paxil 20mg .   He began medication last Monday  01/28/13.  He developed  rash that started three days later 01/30/13, all over body and lips. red bumps, itching.   No swelling of tongue but lips were burning.  He stopped the medication on Friday 02/01/13.  He reports the rash has faded  Reviewed Health History In EMR: Yes  Reviewed Medications In EMR: Yes  Reviewed Allergies In EMR: Yes  Reviewed Surgeries / Procedures: Yes  Date of Onset of Symptoms: 02/01/2013  Treatments Tried: cortison 10,  Treatments Tried Worked: No  Guideline(s) Used:  Rash - Widespread on Drugs - Drug Reaction  Disposition Per Guideline:   See Today in Office  Reason For Disposition Reached:   All other patients with widespread rash taking a new medication  Advice Given:  Stopping the Medication:  If medication is an antibiotic - stop the medication. Reason: possible allergy.  If rash is hives or is very itchy - stop the medication. Reason: possible allergy.  If the medication is an OTC drug - stop the medication. Reason: medicine is not essential.  Other rashes and prescription medications - continue the medication. Reason: lower likelihood of allergic drug reaction.  Oral Antihistamine Medication for Itching:  Take an antihistamine like diphenhydramine (Benadryl) for  widespread rashes that itch. The adult dosage of Benadryl is 25-50 mg by mouth 4 times daily.  An over-the-counter antihistamine that causes less sleepiness is loratadine (e.g., Alavert or Claritin).  Call Back If:  You become worse.  RN Overrode Recommendation:  Patient Requests Prescription  Paxil produced rash/itching.

## 2013-02-05 MED ORDER — BUSPIRONE HCL 7.5 MG PO TABS: 7.5000 mg | ORAL_TABLET | Freq: Two times a day (BID) | ORAL | Status: DC

## 2013-02-05 NOTE — Telephone Encounter (Signed)
Pt following up on call from 8/25. Pt would like to know what to do. owould like another med. Pt saw Ms Orvan Falconer for this med.

## 2013-02-05 NOTE — Telephone Encounter (Signed)
Generic BuSpar 7.5 mg #60 one twice daily. Followup 3 weeks

## 2013-02-05 NOTE — Telephone Encounter (Signed)
New medication sent to pharmacy and patient is aware

## 2013-02-12 ENCOUNTER — Telehealth: Payer: Self-pay | Admitting: Family Medicine

## 2013-02-12 NOTE — Telephone Encounter (Signed)
Left message on machine for patient to return our call 

## 2013-02-12 NOTE — Telephone Encounter (Signed)
Patient Information:  Caller Name: Javoris  Phone: [phone]  Patient: Nicholas Schmitt, Full  Gender: Male  DOB: September 08, 1935  Age: 77 Years  PCP: Kelle Darting [facility])  Office Follow Up:  Does the office need to follow up with this patient?: Yes  Instructions For The Office: Please discuss with MD and call patient back.  RN Note:  Reports fell twice on 02/09/13 due to dizziness. No falls on Sunday or Monday.  Became diaphoretic during episode. Started on Buspar 02/05/13; quit taking Buspar 02/08/13 per friend's advise. No symptoms at present (09:50).  Walked a mile this morning.  Last dizzy episode at 0530 for 15-30 seconds. BP 163/79 P 56 at 0545 after dizzy spell.  Prefers not to be seen unless MD recommends it.    Symptoms  Reason For Call & Symptoms: Episodes of dizziness/lightheadedness; near syncope with everything goind black.  Mild posterior headaches at times.  Reviewed Health History In EMR: Yes  Reviewed Medications In EMR: Yes  Reviewed Allergies In EMR: Yes  Reviewed Surgeries / Procedures: Yes  Date of Onset of Symptoms: 02/08/2013  Guideline(s) Used:  Dizziness  Disposition Per Guideline:   Discuss with PCP and Callback by Nurse Today  Reason For Disposition Reached:   Taking a medicine that could cause dizziness (e.g., blood pressure medications, diuretics)  Advice Given: Hydrate Call if symptoms continue or worsen  Patient Will Follow Care Advice:  YES

## 2013-02-12 NOTE — Telephone Encounter (Signed)
Spoke with patient and he is feeling better today.  He stopped the buspar.  Patient will call back as needed

## 2013-02-15 ENCOUNTER — Encounter: Payer: Self-pay | Admitting: Family Medicine

## 2013-02-15 ENCOUNTER — Ambulatory Visit (INDEPENDENT_AMBULATORY_CARE_PROVIDER_SITE_OTHER): Payer: Medicare Other | Admitting: Family Medicine

## 2013-02-15 VITALS — BP 140/68 | HR 60 | Temp 97.9°F | Wt 217.0 lb

## 2013-02-15 DIAGNOSIS — H538 Other visual disturbances: Secondary | ICD-10-CM

## 2013-02-15 DIAGNOSIS — R55 Syncope and collapse: Secondary | ICD-10-CM

## 2013-02-15 DIAGNOSIS — R42 Dizziness and giddiness: Secondary | ICD-10-CM

## 2013-02-15 LAB — BASIC METABOLIC PANEL
BUN: 21 mg/dL (ref 6–23)
CO2: 26 mEq/L (ref 19–32)
Chloride: 107 mEq/L (ref 96–112)
Creatinine, Ser: 0.9 mg/dL (ref 0.4–1.5)
Glucose, Bld: 96 mg/dL (ref 70–99)
Potassium: 4.1 mEq/L (ref 3.5–5.1)

## 2013-02-15 LAB — CBC WITH DIFFERENTIAL/PLATELET
Eosinophils Absolute: 0.3 10*3/uL (ref 0.0–0.7)
Eosinophils Relative: 4.7 % (ref 0.0–5.0)
HCT: 40.2 % (ref 39.0–52.0)
Lymphs Abs: 1.6 10*3/uL (ref 0.7–4.0)
MCHC: 34.1 g/dL (ref 30.0–36.0)
MCV: 91.9 fl (ref 78.0–100.0)
Monocytes Absolute: 0.6 10*3/uL (ref 0.1–1.0)
Platelets: 163 10*3/uL (ref 150.0–400.0)
RDW: 14.3 % (ref 11.5–14.6)
WBC: 6.2 10*3/uL (ref 4.5–10.5)

## 2013-02-15 LAB — SEDIMENTATION RATE: Sed Rate: 17 mm/hr (ref 0–22)

## 2013-02-15 NOTE — Patient Instructions (Addendum)
We will call you regarding doppler x-rays of neck and abdomen.

## 2013-02-15 NOTE — Progress Notes (Signed)
  Subjective:    Patient ID: Nicholas Schmitt, male    DOB: Feb 22, 1936, 77 y.o.   MRN: [id number]  HPI Acute visit for dizziness Today, while sitting transient bilateral vision-lasting several minutes.  BP 190/79.  No chest pain. Denied headache, focal weakness, speech changes, facial weakness Felt very dizzy (light headed).  No syncope. No palpitations.    He relates he's actually had a few similar episodes recently but has never been consistently with activity He apparently was recently placed on BuSpar for anxiety symptoms but stopped this several days ago on his own for fear that this may the cause of some of those symptoms. He is not describing any consistent orthostatic symptoms  Past Medical History  Diagnosis Date  . Hypertension   . CAD (coronary artery disease)     positive stress test in 2006 led to left heart cath (8/06) showing 95% prox RCA, 90% CFX, and 90% mLAD.  patient had a cypher DES to all 3 lesions  . Hyperlipidemia   . Hearing loss   . Cancer     prostate Tx with prostatectomy  . AAA (abdominal aortic aneurysm) 11/09    3.2 cm   . Asthma    Past Surgical History  Procedure Laterality Date  . Prostatectomy  2005  . Ptca  2006    reports that he has quit smoking. He does not have any smokeless tobacco history on file. He reports that he does not drink alcohol or use illicit drugs. family history includes Asthma in his brother and sister; Coronary artery disease in his other. No Known Allergies    Review of Systems  Constitutional: Negative for fever, chills, appetite change and unexpected weight change.  HENT: Negative for trouble swallowing and voice change.   Eyes: Positive for visual disturbance.  Respiratory: Negative for cough and shortness of breath.   Cardiovascular: Negative for chest pain, palpitations and leg swelling.  Gastrointestinal: Negative for nausea, vomiting, abdominal pain and diarrhea.  Endocrine: Negative for polydipsia and  polyuria.  Genitourinary: Negative for dysuria.  Neurological: Positive for dizziness and light-headedness. Negative for seizures, syncope, weakness and headaches.       Objective:   Physical Exam  Constitutional: He is oriented to person, place, and time. He appears well-developed and well-nourished.  HENT:  Mouth/Throat: Oropharynx is clear and moist.  Neck: Neck supple. No thyromegaly present.  No carotid bruits  Cardiovascular: Normal rate and regular rhythm.   Pulmonary/Chest: Effort normal and breath sounds normal. No respiratory distress. He has no wheezes. He has no rales.  Abdominal: Soft. There is no tenderness.  Musculoskeletal: He exhibits no edema.  Neurological: He is alert and oriented to person, place, and time. He has normal reflexes. No cranial nerve deficit. Coordination normal.  Psychiatric: He has a normal mood and affect. His behavior is normal.          Assessment & Plan:  Patient presents with dizziness. By description this sounds like presyncope.  No actual syncope. No clear provoking factors. He is not having orthostatic changes with standing blood pressure of 138/70. Check EKG. Check basic lab work. Consider carotid Dopplers

## 2013-02-18 ENCOUNTER — Ambulatory Visit: Payer: Medicare Other | Admitting: Family

## 2013-02-19 NOTE — Addendum Note (Signed)
Addended by: Kristian Covey on: 02/19/2013 01:27 PM   Modules accepted: Orders

## 2013-02-21 ENCOUNTER — Encounter (INDEPENDENT_AMBULATORY_CARE_PROVIDER_SITE_OTHER): Payer: Medicare Other

## 2013-02-21 DIAGNOSIS — R42 Dizziness and giddiness: Secondary | ICD-10-CM

## 2013-02-21 DIAGNOSIS — H538 Other visual disturbances: Secondary | ICD-10-CM

## 2013-02-21 DIAGNOSIS — I6529 Occlusion and stenosis of unspecified carotid artery: Secondary | ICD-10-CM

## 2013-02-21 DIAGNOSIS — R55 Syncope and collapse: Secondary | ICD-10-CM

## 2013-02-25 ENCOUNTER — Telehealth: Payer: Self-pay | Admitting: Family Medicine

## 2013-02-25 ENCOUNTER — Inpatient Hospital Stay (HOSPITAL_COMMUNITY)
Admission: EM | Admit: 2013-02-25 | Discharge: 2013-02-27 | DRG: 244 | Disposition: A | Discharge status: Home / Self Care | Payer: Medicare Other | Attending: Cardiology | Admitting: Cardiology

## 2013-02-25 ENCOUNTER — Emergency Department (HOSPITAL_COMMUNITY): Payer: Medicare Other

## 2013-02-25 ENCOUNTER — Encounter (HOSPITAL_COMMUNITY): Payer: Self-pay | Admitting: *Deleted

## 2013-02-25 DIAGNOSIS — Z8249 Family history of ischemic heart disease and other diseases of the circulatory system: Secondary | ICD-10-CM

## 2013-02-25 DIAGNOSIS — J449 Chronic obstructive pulmonary disease, unspecified: Secondary | ICD-10-CM | POA: Diagnosis present

## 2013-02-25 DIAGNOSIS — Z825 Family history of asthma and other chronic lower respiratory diseases: Secondary | ICD-10-CM

## 2013-02-25 DIAGNOSIS — Z8546 Personal history of malignant neoplasm of prostate: Secondary | ICD-10-CM

## 2013-02-25 DIAGNOSIS — I714 Abdominal aortic aneurysm, without rupture, unspecified: Secondary | ICD-10-CM | POA: Diagnosis present

## 2013-02-25 DIAGNOSIS — I499 Cardiac arrhythmia, unspecified: Secondary | ICD-10-CM

## 2013-02-25 DIAGNOSIS — I2510 Atherosclerotic heart disease of native coronary artery without angina pectoris: Secondary | ICD-10-CM | POA: Diagnosis present

## 2013-02-25 DIAGNOSIS — Z9079 Acquired absence of other genital organ(s): Secondary | ICD-10-CM

## 2013-02-25 DIAGNOSIS — Z7902 Long term (current) use of antithrombotics/antiplatelets: Secondary | ICD-10-CM

## 2013-02-25 DIAGNOSIS — E785 Hyperlipidemia, unspecified: Secondary | ICD-10-CM | POA: Diagnosis present

## 2013-02-25 DIAGNOSIS — Z7982 Long term (current) use of aspirin: Secondary | ICD-10-CM

## 2013-02-25 DIAGNOSIS — H900 Conductive hearing loss, bilateral: Secondary | ICD-10-CM

## 2013-02-25 DIAGNOSIS — J45909 Unspecified asthma, uncomplicated: Secondary | ICD-10-CM

## 2013-02-25 DIAGNOSIS — L570 Actinic keratosis: Secondary | ICD-10-CM

## 2013-02-25 DIAGNOSIS — R55 Syncope and collapse: Secondary | ICD-10-CM

## 2013-02-25 DIAGNOSIS — I10 Essential (primary) hypertension: Secondary | ICD-10-CM | POA: Diagnosis present

## 2013-02-25 DIAGNOSIS — Z9861 Coronary angioplasty status: Secondary | ICD-10-CM

## 2013-02-25 DIAGNOSIS — Z87891 Personal history of nicotine dependence: Secondary | ICD-10-CM

## 2013-02-25 DIAGNOSIS — R0600 Dyspnea, unspecified: Secondary | ICD-10-CM

## 2013-02-25 DIAGNOSIS — H9190 Unspecified hearing loss, unspecified ear: Secondary | ICD-10-CM | POA: Diagnosis present

## 2013-02-25 DIAGNOSIS — I495 Sick sinus syndrome: Principal | ICD-10-CM | POA: Diagnosis present

## 2013-02-25 DIAGNOSIS — Z79899 Other long term (current) drug therapy: Secondary | ICD-10-CM

## 2013-02-25 DIAGNOSIS — J4489 Other specified chronic obstructive pulmonary disease: Secondary | ICD-10-CM | POA: Diagnosis present

## 2013-02-25 DIAGNOSIS — C61 Malignant neoplasm of prostate: Secondary | ICD-10-CM

## 2013-02-25 LAB — CBC WITH DIFFERENTIAL/PLATELET
Eosinophils Absolute: 0.2 10*3/uL (ref 0.0–0.7)
Hemoglobin: 13.8 g/dL (ref 13.0–17.0)
Lymphocytes Relative: 27 % (ref 12–46)
Lymphs Abs: 1.6 10*3/uL (ref 0.7–4.0)
MCH: 31.9 pg (ref 26.0–34.0)
Monocytes Relative: 8 % (ref 3–12)
Neutro Abs: 3.5 10*3/uL (ref 1.7–7.7)
Neutrophils Relative %: 61 % (ref 43–77)
RBC: 4.33 MIL/uL (ref 4.22–5.81)

## 2013-02-25 LAB — URINALYSIS, ROUTINE W REFLEX MICROSCOPIC
Bilirubin Urine: NEGATIVE
Hgb urine dipstick: NEGATIVE
Protein, ur: NEGATIVE mg/dL
Urobilinogen, UA: 1 mg/dL (ref 0.0–1.0)

## 2013-02-25 LAB — COMPREHENSIVE METABOLIC PANEL
Alkaline Phosphatase: 91 U/L (ref 39–117)
BUN: 19 mg/dL (ref 6–23)
CO2: 23 mEq/L (ref 19–32)
Chloride: 104 mEq/L (ref 96–112)
GFR calc Af Amer: 66 mL/min — ABNORMAL LOW (ref >90)
GFR calc non Af Amer: 57 mL/min — ABNORMAL LOW (ref >90)
Glucose, Bld: 131 mg/dL — ABNORMAL HIGH (ref 70–99)
Potassium: 3.9 mEq/L (ref 3.5–5.1)
Total Bilirubin: 1 mg/dL (ref 0.3–1.2)

## 2013-02-25 LAB — POCT I-STAT TROPONIN I: Troponin i, poc: 0 ng/mL (ref 0.00–0.08)

## 2013-02-25 MED ORDER — CLOPIDOGREL BISULFATE 75 MG PO TABS
75.0000 mg | ORAL_TABLET | Freq: Every day | ORAL | Status: DC
  Administered 2013-02-26 – 2013-02-27 (×2): 75 mg via ORAL
  Filled 2013-02-25 (×2): qty 1

## 2013-02-25 MED ORDER — LOSARTAN POTASSIUM 50 MG PO TABS
100.0000 mg | ORAL_TABLET | Freq: Every day | ORAL | Status: DC
  Administered 2013-02-26 – 2013-02-27 (×2): 100 mg via ORAL
  Filled 2013-02-25 (×2): qty 2

## 2013-02-25 MED ORDER — ASPIRIN 81 MG PO TABS: 81.0000 mg | ORAL_TABLET | Freq: Two times a day (BID) | ORAL | Status: DC

## 2013-02-25 MED ORDER — SODIUM CHLORIDE 0.9 % IV SOLN: 250.0000 mL | INTRAVENOUS | Status: DC | PRN

## 2013-02-25 MED ORDER — ASPIRIN EC 81 MG PO TBEC
81.0000 mg | DELAYED_RELEASE_TABLET | Freq: Every day | ORAL | Status: DC
  Administered 2013-02-26 – 2013-02-27 (×2): 81 mg via ORAL
  Filled 2013-02-25 (×2): qty 1

## 2013-02-25 MED ORDER — SODIUM CHLORIDE 0.9 % IJ SOLN
3.0000 mL | Freq: Two times a day (BID) | INTRAMUSCULAR | Status: DC
  Administered 2013-02-26: 3 mL via INTRAVENOUS

## 2013-02-25 MED ORDER — ALBUTEROL SULFATE HFA 108 (90 BASE) MCG/ACT IN AERS: 2.0000 | INHALATION_SPRAY | Freq: Four times a day (QID) | RESPIRATORY_TRACT | Status: DC | PRN

## 2013-02-25 MED ORDER — SODIUM CHLORIDE 0.9 % IJ SOLN: 3.0000 mL | INTRAMUSCULAR | Status: DC | PRN

## 2013-02-25 MED ORDER — AMLODIPINE BESYLATE 10 MG PO TABS
10.0000 mg | ORAL_TABLET | Freq: Every day | ORAL | Status: DC
  Administered 2013-02-26 – 2013-02-27 (×2): 10 mg via ORAL
  Filled 2013-02-25 (×2): qty 1

## 2013-02-25 MED ORDER — HEPARIN SODIUM (PORCINE) 5000 UNIT/ML IJ SOLN
5000.0000 [IU] | Freq: Three times a day (TID) | INTRAMUSCULAR | Status: DC
  Administered 2013-02-26 (×2): 5000 [IU] via SUBCUTANEOUS
  Filled 2013-02-25 (×5): qty 1

## 2013-02-25 MED ORDER — ATORVASTATIN CALCIUM 40 MG PO TABS
40.0000 mg | ORAL_TABLET | Freq: Every day | ORAL | Status: DC
  Administered 2013-02-26 – 2013-02-27 (×2): 40 mg via ORAL
  Filled 2013-02-25 (×2): qty 1

## 2013-02-25 MED ORDER — NITROGLYCERIN 0.4 MG SL SUBL
0.4000 mg | SUBLINGUAL_TABLET | SUBLINGUAL | Status: DC | PRN
Start: 2013-02-25 — End: 2013-02-27

## 2013-02-25 MED ORDER — ACETAMINOPHEN 325 MG PO TABS: 650.0000 mg | ORAL_TABLET | ORAL | Status: DC | PRN

## 2013-02-25 MED ORDER — BUDESONIDE-FORMOTEROL FUMARATE 160-4.5 MCG/ACT IN AERO: 1.0000 | INHALATION_SPRAY | RESPIRATORY_TRACT | Status: DC | PRN

## 2013-02-25 MED ORDER — ONDANSETRON HCL 4 MG/2ML IJ SOLN: 4.0000 mg | Freq: Four times a day (QID) | INTRAMUSCULAR | Status: DC | PRN

## 2013-02-25 NOTE — ED Provider Notes (Signed)
CSN: [id number]     Arrival date & time 02/25/13  1604 History   First MD Initiated Contact with Patient 02/25/13 1823     Chief Complaint  Patient presents with  . Shortness of Breath  . Chest Pain   (Consider location/radiation/quality/duration/timing/severity/associated sxs/prior Treatment) HPI Comments: Nicholas Schmitt is a 77 y.o. male who states that he has had 6 episodes of near syncope in the last 3 weeks. These are associated with periods of shortness of breath. Both occur with exertion. He has not had associated chest pain, nausea, vomiting, fever, chills, or change in bowel or urinary habits. His PCP sent him for carotid Dopplers recently and plans on scanning . His abdominal aorta for possible progression of AAA. He does not have abdominal pain. He is using his usual medications, without relief. He has never had this previously. He has coronary artery disease and is status post mitral valve replacement. He sees his cardiologist regularly, but has not had any cardiac problems, recently. There are no other known modifying factors.   Patient is a 77 y.o. male presenting with shortness of breath and chest pain. The history is provided by the patient.  Shortness of Breath Associated symptoms: chest pain   Chest Pain Associated symptoms: shortness of breath     Past Medical History  Diagnosis Date  . Hypertension   . CAD (coronary artery disease)     positive stress test in 2006 led to left heart cath (8/06) showing 95% prox RCA, 90% CFX, and 90% mLAD.  patient had a cypher DES to all 3 lesions  . Hyperlipidemia   . Hearing loss   . Cancer     prostate Tx with prostatectomy  . AAA (abdominal aortic aneurysm) 11/09    3.2 cm   . Asthma    Past Surgical History  Procedure Laterality Date  . Prostatectomy  2005  . Ptca  2006  . Coronary stent placement     Family History  Problem Relation Age of Onset  . Asthma Sister   . Asthma Brother   . Coronary artery disease Other     History  Substance Use Topics  . Smoking status: Former Games developer  . Smokeless tobacco: Not on file  . Alcohol Use: No    Review of Systems  Respiratory: Positive for shortness of breath.   Cardiovascular: Positive for chest pain.  All other systems reviewed and are negative.    Allergies  Review of patient's allergies indicates no known allergies.  Home Medications   Current Outpatient Rx  Name  Route  Sig  Dispense  Refill  . albuterol (PROVENTIL,VENTOLIN) 90 MCG/ACT inhaler   Inhalation   Inhale 2 puffs into the lungs every 6 (six) hours as needed.   17 g   3   . amLODipine (NORVASC) 10 MG tablet   Oral   Take 1 tablet (10 mg total) by mouth daily.   100 tablet   3   . aspirin 81 MG tablet   Oral   Take 81 mg by mouth 2 (two) times daily.           [name] atorvastatin (LIPITOR) 40 MG tablet   Oral   Take 1 tablet (40 mg total) by mouth daily.   100 tablet   3   . budesonide-formoterol (SYMBICORT) 160-4.5 MCG/ACT inhaler   Inhalation   Inhale 1 puff into the lungs as needed (shortness of breath).         . cholecalciferol (  VITAMIN D) 400 UNITS TABS tablet   Oral   Take 1,000 Units by mouth daily.         . clopidogrel (PLAVIX) 75 MG tablet   Oral   Take 1 tablet (75 mg total) by mouth daily.   100 tablet   3   . fish oil-omega-3 fatty acids 1000 MG capsule   Oral   Take 2 g by mouth daily.           [name] losartan (COZAAR) 100 MG tablet   Oral   Take 1 tablet (100 mg total) by mouth daily.   100 tablet   3   . Multiple Vitamin (MULTIVITAMIN) tablet   Oral   Take 1 tablet by mouth daily.           . nitroGLYCERIN (NITROSTAT) 0.4 MG SL tablet   Sublingual   Place 1 tablet (0.4 mg total) under the tongue every 5 (five) minutes as needed.   25 tablet   1    BP 152/87  Pulse 48  Temp(Src) 98.2 F (36.8 C) (Oral)  Resp 18  SpO2 99% Physical Exam  Nursing note and vitals reviewed. Constitutional: He is oriented to person, place, and  time. He appears well-developed and well-nourished.  HENT:  Head: Normocephalic and atraumatic.  Right Ear: External ear normal.  Left Ear: External ear normal.  Eyes: Conjunctivae and EOM are normal. Pupils are equal, round, and reactive to light.  Neck: Normal range of motion and phonation normal. Neck supple.  Cardiovascular: Normal rate, regular rhythm, normal heart sounds and intact distal pulses.   Pulmonary/Chest: Effort normal and breath sounds normal. He exhibits no bony tenderness.  Abdominal: Soft. Normal appearance. There is no tenderness.  Musculoskeletal: Normal range of motion.  Neurological: He is alert and oriented to person, place, and time. He has normal strength. No cranial nerve deficit or sensory deficit. He exhibits normal muscle tone. Coordination normal.  Normal gait without ataxia  Skin: Skin is warm, dry and intact.  Psychiatric: He has a normal mood and affect. His behavior is normal. Judgment and thought content normal.    ED Course  Procedures (including critical care time)    Date: 02/24/14  Rate: 63  Rhythm: normal sinus rhythm  QRS Axis: normal  PR and QT Intervals: normal  ST/T Wave abnormalities: normal  PR and QRS Conduction Disutrbances:right bundle branch block  Narrative Interpretation:   Old EKG Reviewed: unchanged-02-15-13  20:00- cardiac monitor indicates sinus bradycardia, 35  20:10- heart rate, 39-58  8:15 PM-Consult complete with cardiologist. Patient case explained and discussed. He agrees to admit patient for further evaluation and treatment. Call ended at 2022   Labs Review Labs Reviewed  PRO B NATRIURETIC PEPTIDE - Abnormal; Notable for the following:    Pro B Natriuretic peptide (BNP) 146.5 (*)    All other components within normal limits  CBC WITH DIFFERENTIAL - Abnormal; Notable for the following:    Platelets 148 (*)    All other components within normal limits  COMPREHENSIVE METABOLIC PANEL - Abnormal; Notable for the  following:    Glucose, Bld 131 (*)    GFR calc non Af Amer 57 (*)    GFR calc Af Amer 66 (*)    All other components within normal limits  URINALYSIS, ROUTINE W REFLEX MICROSCOPIC  POCT I-STAT TROPONIN I   Imaging Review Dg Chest 2 View  02/25/2013   CLINICAL DATA:  Shortness of Breath. Chest pain.  EXAM: CHEST  2 VIEW  COMPARISON:  07/22/2010  FINDINGS: Reticular lung base scarring is stable. The lungs are otherwise clear.  The cardiac silhouette is normal in size and configuration. The mediastinum is normal in contour and caliber. There are no hilar masses.  No pleural effusion or pneumothorax.  The bony thorax is intact.  IMPRESSION: No acute cardiopulmonary disease.   Electronically Signed   By: Amie Portland   On: 02/25/2013 17:24    MDM   1. Near syncope   2. Sick sinus syndrome      Evaluation, consistent with periods of sinus bradycardia, which are likely leading to near syncope. This indicates sick sinus syndrome, as there is no other overt cause for bradycardia. Visual need to be admitted for stabilization and likely electrophysiologic evaluation followed by pacemaker placement.  Nursing Notes Reviewed/ Care Coordinated, and agree without changes. Applicable Imaging Reviewed.  Interpretation of Laboratory Data incorporated into ED treatment  Plan: Admit    Flint Melter, MD 02/25/13 2022

## 2013-02-25 NOTE — ED Notes (Signed)
Pt is here with chest tightness, sob on exertion and reports history of stent and AAA

## 2013-02-25 NOTE — Telephone Encounter (Signed)
Patient had called about Dizziness and SOB, was screened for critical Sx. Attempted to reach patient twice but not answer either time - left message for patient to call office back.

## 2013-02-25 NOTE — ED Notes (Signed)
Pt removed leads, up to BR.  Stated he tolerated well.  Requested pt remain in room on monitor.  Urinal at bedside for use.  Reports he does not get dizzy in his room, only with activity.

## 2013-02-25 NOTE — ED Notes (Signed)
Report to receiving RN on 2H.

## 2013-02-25 NOTE — ED Notes (Signed)
Pt reports he was sent in by his physician for heart monitoring.  Monitor placed following report from previous shift RN.  Shows SB, rate high 30's to low 60's.  Pt reports he at times becomes dizzy and short of breath.

## 2013-02-25 NOTE — ED Notes (Signed)
MD at bedside, cardiologist.

## 2013-02-25 NOTE — Telephone Encounter (Signed)
Patient Information:  Caller Name: Vyom  Phone: [phone]  Patient: Nicholas Schmitt, Nicholas Schmitt  Gender: Male  DOB: 1935-11-21  Age: 77 Years  PCP: [facility])  Office Follow Up:  Does the office need to follow up with this patient?: No  Instructions For The Office: N/A  RN Note:  c/o dizziness and shortness of breath on exertion.  States this has been present x 2 weeks and he has seen Dr. Caryl Never for it.  States carotid doppler study was normal.  States he gets out of breath when he walks.  c/o new pain in the back of his shoulders over past several hours.  No appts available in office today.  Per chest pain protocol, advised ED now; states will go to [facility] ED.  Info to office.  krs/can  Symptoms  Reason For Call & Symptoms: dizziness and shortness of breath  Reviewed Health History In EMR: Yes  Reviewed Medications In EMR: Yes  Reviewed Allergies In EMR: Yes  Reviewed Surgeries / Procedures: Yes  Date of Onset of Symptoms: 02/11/2013  Guideline(s) Used:  Dizziness  Chest Pain  Disposition Per Guideline:   Go to ED Now  Reason For Disposition Reached:   Pain also present in shoulder(s) or arm(s) or jaw  Advice Given:  N/A  Patient Will Follow Care Advice:  YES

## 2013-02-25 NOTE — H&P (Signed)
Physician History and Physical    ISIAIH HOLLENBACH MRN: [id number] DOB/AGE: 15-Oct-1935 77 y.o. Admit date: 02/25/2013  Primary Care Physician: Dr. Tawanna Cooler Primary Cardiologist: Dr. Shirlee Latch  HPI:  77 yo with history of CAD, HTN, hyperlipidemia, asthma/COPD presented with lightheaded/presyncopal spells.  Patient has a history of CAD with multivessel PCI as described below in 8/06.  He has not had any significant chest pain since that time.  About a year ago, he was driving his car and got very lightheaded.  He did not pass out but was close.  He was able to pull his car over and the spell passed.  He did not have a recurrence of this until about 3 wks ago.  3 weeks ago, he was walking in the woods and got very lightheaded fairly suddenly.  He fell to the ground but did not completely pass out.  The spell passed in about 30 seconds.  Since that time, he has had multiple episodes at rest or with walking where he will get lightheaded.  He will try to sit down.  He has not passed out.  The spells pass in 30-60 seconds.  He actually has not had an episode in a couple of days but called his PCP today to tell him about these spells.  PCP told him to go to the ER for evaluation.  In the ER, HR was noted to decrease to as low as the 30s with sinus bradycardia.  HR currently in the 60s.  Per ER notes, he was lightheaded when HR dropped down though he does not remember this.  He is not on any AV nodal blocking medications.    Only other symptom recently has been increased exertional dyspnea for the last few weeks.  He actually walks a mile a day in the morning and has no problems on flat ground but has been noting dyspnea with steps or hills.  This is new for him.  No chest pain.   Review of systems complete and found to be negative unless listed above   PMH: 1. CAD: Abnormal stress test with cath showing 80% pLCx, 90% mLAD, and 95% pRCA in 8/06.  Cypher DES to RCA and LCx, BMS to LAD.  Echo (2011): EF 65-70%,  mild LVH, PA systolic pressure 39 mmHg.  2. Hyperlipidemia 3. HTN: ACEI cough 4. AAA: 11/12 [place] with 3.4 cm AAA. 5. Carotid dopplers with minimal disease in 9/14.  6. Prostate cancer s/p prostatectomy 7. Asthma/COPD  Family History  Problem Relation Age of Onset  . Asthma Sister   . Asthma Brother   . Coronary artery disease Other     History   Social History  . Marital Status: Divorced    Spouse Name: N/A    Number of Children: N/A  . Years of Education: N/A   Occupational History  . Not on file.   Social History Main Topics  . Smoking status: Former Games developer  . Smokeless tobacco: Not on file  . Alcohol Use: No  . Drug Use: No  . Sexual Activity: Not on file   Other Topics Concern  . Not on file   Social History Narrative  . No narrative on file    No current facility-administered medications for this encounter.   Current Outpatient Prescriptions  Medication Sig Dispense Refill  . albuterol (PROVENTIL,VENTOLIN) 90 MCG/ACT inhaler Inhale 2 puffs into the lungs every 6 (six) hours as needed.  17 g  3  . amLODipine (NORVASC) 10 MG  tablet Take 1 tablet (10 mg total) by mouth daily.  100 tablet  3  . aspirin 81 MG tablet Take 81 mg by mouth 2 (two) times daily.        [name] atorvastatin (LIPITOR) 40 MG tablet Take 1 tablet (40 mg total) by mouth daily.  100 tablet  3  . budesonide-formoterol (SYMBICORT) 160-4.5 MCG/ACT inhaler Inhale 1 puff into the lungs as needed (shortness of breath).      . cholecalciferol (VITAMIN D) 400 UNITS TABS tablet Take 1,000 Units by mouth daily.      . clopidogrel (PLAVIX) 75 MG tablet Take 1 tablet (75 mg total) by mouth daily.  100 tablet  3  . fish oil-omega-3 fatty acids 1000 MG capsule Take 2 g by mouth daily.        [name] losartan (COZAAR) 100 MG tablet Take 1 tablet (100 mg total) by mouth daily.  100 tablet  3  . Multiple Vitamin (MULTIVITAMIN) tablet Take 1 tablet by mouth daily.        . nitroGLYCERIN (NITROSTAT) 0.4 MG SL tablet Place 1  tablet (0.4 mg total) under the tongue every 5 (five) minutes as needed.  25 tablet  1    Physical Exam: Blood pressure 145/76, pulse 51, temperature 98.2 F (36.8 C), temperature source Oral, resp. rate 12, SpO2 96.00%.  General: NAD Neck: No JVD, no thyromegaly or thyroid nodule.  Lungs: Clear to auscultation bilaterally with normal respiratory effort. CV: Nondisplaced PMI.  Heart regular S1/S2, no S3/S4, no murmur.  No peripheral edema.  No carotid bruit.  Normal pedal pulses.  Abdomen: Soft, nontender, no hepatosplenomegaly, no distention.  Skin: Intact without lesions or rashes.  Neurologic: Alert and oriented x 3.  Psych: Normal affect. Extremities: No clubbing or cyanosis.  HEENT: Normal.   Labs:   Lab Results  Component Value Date   WBC 5.8 02/25/2013   HGB 13.8 02/25/2013   HCT 39.4 02/25/2013   MCV 91.0 02/25/2013   PLT 148* 02/25/2013    Recent Labs Lab 02/25/13 1626  NA 138  K 3.9  CL 104  CO2 23  BUN 19  CREATININE 1.21  CALCIUM 9.7  PROT 6.8  BILITOT 1.0  ALKPHOS 91  ALT 22  AST 24  GLUCOSE 131*  BNP 147 TnI 0  Radiology: - CXR: no significant cardiopulmonary disease.  EKG: NSR at 63, RBBB, LPFB  ASSESSMENT AND PLAN:  1. Presyncope: Multiple episodes over the last 3 weeks, 1 episode about a year ago.  Per ER staff, he was lightheaded while in the ER when HR dipped to the 30s though he does not remember this.  Concern for sick sinus syndrome: sinus bradycardia to 30s noted in the ER (transient, currently in 60s).  - Admit to step-down  - Monitor on telemetry overnight, will keep Zoll pads at bedside.  - Will have evaluation by EP in the morning.  - Will need echocardiogram and will cycle cardiac enzymes (though doubt ACS).  2. HTN: BP is quite high currently, can continue amlodipine and losartan for now.  3. CAD: No chest pain.  As above, will get echo and cycle cardiac enzymes though I do not think that he has ACS.  Will continue ASA 81, Plavix (has  several 1st generation drug-eluting stents), and statin.   Signed: Marca Ancona 02/25/2013, 8:57 PM

## 2013-02-26 ENCOUNTER — Encounter (HOSPITAL_COMMUNITY)
Admission: EM | Disposition: A | Discharge status: Home / Self Care | Payer: Self-pay | Source: Home / Self Care | Attending: Cardiology

## 2013-02-26 DIAGNOSIS — R0609 Other forms of dyspnea: Secondary | ICD-10-CM

## 2013-02-26 DIAGNOSIS — I369 Nonrheumatic tricuspid valve disorder, unspecified: Secondary | ICD-10-CM

## 2013-02-26 DIAGNOSIS — I495 Sick sinus syndrome: Secondary | ICD-10-CM

## 2013-02-26 HISTORY — PX: PERMANENT PACEMAKER INSERTION: SHX5480

## 2013-02-26 HISTORY — PX: PERMANENT PACEMAKER INSERTION: SHX6023

## 2013-02-26 LAB — TROPONIN I
Troponin I: <0.3 ng/mL (ref <0.30)
Troponin I: <0.3 ng/mL (ref <0.30)

## 2013-02-26 LAB — CBC
HCT: 40.2 % (ref 39.0–52.0)
MCHC: 33.8 g/dL (ref 30.0–36.0)
MCV: 90.7 fL (ref 78.0–100.0)
Platelets: 141 10*3/uL — ABNORMAL LOW (ref 150–400)
RDW: 13.6 % (ref 11.5–15.5)
WBC: 4.7 10*3/uL (ref 4.0–10.5)

## 2013-02-26 LAB — BASIC METABOLIC PANEL
BUN: 15 mg/dL (ref 6–23)
Chloride: 106 mEq/L (ref 96–112)
Creatinine, Ser: 0.87 mg/dL (ref 0.50–1.35)
GFR calc Af Amer: >90 mL/min (ref >90)
GFR calc non Af Amer: 83 mL/min — ABNORMAL LOW (ref >90)
Potassium: 4.6 mEq/L (ref 3.5–5.1)

## 2013-02-26 LAB — MRSA PCR SCREENING: MRSA by PCR: NEGATIVE

## 2013-02-26 LAB — TSH: TSH: 6.628 u[IU]/mL — ABNORMAL HIGH (ref 0.350–4.500)

## 2013-02-26 LAB — LIPID PANEL
Cholesterol: 113 mg/dL (ref 0–200)
Total CHOL/HDL Ratio: 3 RATIO

## 2013-02-26 SURGERY — PERMANENT PACEMAKER INSERTION
Anesthesia: LOCAL

## 2013-02-26 MED ORDER — HEPARIN (PORCINE) IN NACL 2-0.9 UNIT/ML-% IJ SOLN
INTRAMUSCULAR | Status: AC
  Filled 2013-02-26: qty 500

## 2013-02-26 MED ORDER — SODIUM CHLORIDE 0.9 % IV SOLN: 250.0000 mL | INTRAVENOUS | Status: DC | PRN

## 2013-02-26 MED ORDER — CHLORHEXIDINE GLUCONATE 4 % EX LIQD: 60.0000 mL | Freq: Once | CUTANEOUS | Status: AC

## 2013-02-26 MED ORDER — SODIUM CHLORIDE 0.9 % IJ SOLN
3.0000 mL | INTRAMUSCULAR | Status: DC | PRN
  Administered 2013-02-26: 3 mL via INTRAVENOUS

## 2013-02-26 MED ORDER — HYDROCODONE-ACETAMINOPHEN 5-325 MG PO TABS
1.0000 | ORAL_TABLET | ORAL | Status: DC | PRN
  Administered 2013-02-27: 03:00:00 1 via ORAL
  Filled 2013-02-26: qty 1

## 2013-02-26 MED ORDER — MIDAZOLAM HCL 5 MG/5ML IJ SOLN
INTRAMUSCULAR | Status: AC
  Filled 2013-02-26: qty 5

## 2013-02-26 MED ORDER — CHLORHEXIDINE GLUCONATE 4 % EX LIQD
60.0000 mL | Freq: Once | CUTANEOUS | Status: AC
  Administered 2013-02-26: 4 via TOPICAL

## 2013-02-26 MED ORDER — CHLORHEXIDINE GLUCONATE 4 % EX LIQD
CUTANEOUS | Status: AC
  Filled 2013-02-26: qty 15

## 2013-02-26 MED ORDER — ONDANSETRON HCL 4 MG/2ML IJ SOLN: 4.0000 mg | Freq: Four times a day (QID) | INTRAMUSCULAR | Status: DC | PRN

## 2013-02-26 MED ORDER — YOU HAVE A PACEMAKER BOOK
Freq: Once | Status: AC
  Administered 2013-02-26: 22:00:00
  Filled 2013-02-26: qty 1

## 2013-02-26 MED ORDER — LIDOCAINE HCL (PF) 1 % IJ SOLN
INTRAMUSCULAR | Status: AC
  Filled 2013-02-26: qty 60

## 2013-02-26 MED ORDER — FENTANYL CITRATE 0.05 MG/ML IJ SOLN
INTRAMUSCULAR | Status: AC
  Filled 2013-02-26: qty 2

## 2013-02-26 MED ORDER — CEFAZOLIN SODIUM 1-5 GM-% IV SOLN
1.0000 g | Freq: Four times a day (QID) | INTRAVENOUS | Status: AC
  Administered 2013-02-26 – 2013-02-27 (×3): 1 g via INTRAVENOUS
  Filled 2013-02-26 (×5): qty 50

## 2013-02-26 MED ORDER — SODIUM CHLORIDE 0.9 % IJ SOLN: 3.0000 mL | Freq: Two times a day (BID) | INTRAMUSCULAR | Status: DC

## 2013-02-26 MED ORDER — ACETAMINOPHEN 325 MG PO TABS: 325.0000 mg | ORAL_TABLET | ORAL | Status: DC | PRN

## 2013-02-26 MED ORDER — CEFAZOLIN SODIUM-DEXTROSE 2-3 GM-% IV SOLR
2.0000 g | INTRAVENOUS | Status: DC
  Filled 2013-02-26 (×2): qty 50

## 2013-02-26 MED ORDER — SODIUM CHLORIDE 0.9 % IR SOLN
80.0000 mg | Status: DC
  Filled 2013-02-26 (×2): qty 2

## 2013-02-26 MED ORDER — SODIUM CHLORIDE 0.9 % IV SOLN
INTRAVENOUS | Status: DC
  Administered 2013-02-26: 14:00:00 via INTRAVENOUS

## 2013-02-26 NOTE — Progress Notes (Signed)
Not coming back  from the cath lab., belongings brought to the cath lab. Endorsed to staff.

## 2013-02-26 NOTE — Consult Note (Addendum)
ELECTROPHYSIOLOGY CONSULT NOTE    Patient ID: Nicholas Schmitt MRN: [id number], DOB/AGE: 75/04/1936 75 y.o.  Admit date: 02/25/2013 Date of Consult: 02-26-2013  Primary Physician: Evette Georges, MD Primary Cardiologist: Marca Ancona, MD  Reason for Consultation: bradycardia and pre-syncope  HPI:  Nicholas Schmitt is a 77 year old male with a past medical history significant for coronary disease (PCI to RCA in 01/2005), hypertension, hyperlipidemia, and asthma/COPD.   His last cardiology follow up was in 2011 at which time an echo was done which revealed normal EF.    He came to the ER yesterday for evaluation of pre-syncope.  His first episode was about a year ago and then he has had several episodes in the last 3 weeks.  These spells are not related to position and last about 30 seconds.  He feels like his vision is going black when they occur. He has never had frank syncope.  While in the ER, his heart rate transiently dropped into the 30's, notes report symptoms with this, but the patient does not remember any.   He has also had increased dyspnea on exertion in the last few months and decreased exercise tolerance with incline.   He denies chest pain, palpitations, recent fevers or chills.  ROS is otherwise negative except as outlined above.  Lab work this admission is notable for negative cardiac enzymes, normal electrolytes, TSH pending.  Repeat echo pending this admission. He has been on no AV nodal blocking medications.   EP has been asked to evaluate for treatment options.   Past Medical History  Diagnosis Date  . Hypertension   . CAD (coronary artery disease)     positive stress test in 2006 led to left heart cath (8/06) showing 95% prox RCA, 90% CFX, and 90% mLAD.  patient had a cypher DES to all 3 lesions  . Hyperlipidemia   . Hearing loss   . Cancer     prostate Tx with prostatectomy  . AAA (abdominal aortic aneurysm) 11/09    3.2 cm   . Asthma      Surgical  History:  Past Surgical History  Procedure Laterality Date  . Prostatectomy  2005  . Ptca  2006  . Coronary stent placement       Prescriptions prior to admission  Medication Sig Dispense Refill  . albuterol (PROVENTIL,VENTOLIN) 90 MCG/ACT inhaler Inhale 2 puffs into the lungs every 6 (six) hours as needed.  17 g  3  . amLODipine (NORVASC) 10 MG tablet Take 1 tablet (10 mg total) by mouth daily.  100 tablet  3  . aspirin 81 MG tablet Take 81 mg by mouth 2 (two) times daily.        [name] atorvastatin (LIPITOR) 40 MG tablet Take 1 tablet (40 mg total) by mouth daily.  100 tablet  3  . budesonide-formoterol (SYMBICORT) 160-4.5 MCG/ACT inhaler Inhale 1 puff into the lungs as needed (shortness of breath).      . cholecalciferol (VITAMIN D) 400 UNITS TABS tablet Take 1,000 Units by mouth daily.      . clopidogrel (PLAVIX) 75 MG tablet Take 1 tablet (75 mg total) by mouth daily.  100 tablet  3  . fish oil-omega-3 fatty acids 1000 MG capsule Take 2 g by mouth daily.        [name] losartan (COZAAR) 100 MG tablet Take 1 tablet (100 mg total) by mouth daily.  100 tablet  3  . Multiple Vitamin (MULTIVITAMIN) tablet Take 1  tablet by mouth daily.        . nitroGLYCERIN (NITROSTAT) 0.4 MG SL tablet Place 1 tablet (0.4 mg total) under the tongue every 5 (five) minutes as needed.  25 tablet  1    Inpatient Medications:  . amLODipine  10 mg Oral Daily  . aspirin EC  81 mg Oral Daily  . atorvastatin  40 mg Oral Daily  . clopidogrel  75 mg Oral Q breakfast  . heparin  5,000 Units Subcutaneous Q8H  . losartan  100 mg Oral Daily  . sodium chloride  3 mL Intravenous Q12H    Allergies: No Known Allergies  History   Social History  . Marital Status: Divorced    Spouse Name: N/A    Number of Children: N/A  . Years of Education: N/A   Occupational History  . Not on file.   Social History Main Topics  . Smoking status: Former Games developer  . Smokeless tobacco: Not on file  . Alcohol Use: No  . Drug Use: No    . Sexual Activity: Not on file   Other Topics Concern  . Not on file   Social History Narrative  . No narrative on file     Family History  Problem Relation Age of Onset  . Asthma Sister   . Asthma Brother   . Coronary artery disease Other       Labs:   Lab Results  Component Value Date   WBC 4.7 02/26/2013   HGB 13.6 02/26/2013   HCT 40.2 02/26/2013   MCV 90.7 02/26/2013   PLT 141* 02/26/2013    Recent Labs Lab 02/25/13 1626  NA 138  K 3.9  CL 104  CO2 23  BUN 19  CREATININE 1.21  CALCIUM 9.7  PROT 6.8  BILITOT 1.0  ALKPHOS 91  ALT 22  AST 24  GLUCOSE 131*   Lab Results  Component Value Date   TROPONINI <0.30 02/26/2013   Lab Results  Component Value Date   CHOL 135 03/08/2012   CHOL 131 03/21/2011   CHOL 128 03/19/2010   Lab Results  Component Value Date   HDL 42.60 03/08/2012   HDL 72.53* 03/21/2011   HDL 36.40* 03/19/2010   Lab Results  Component Value Date   LDLCALC 77 03/08/2012   LDLCALC 77 03/21/2011   LDLCALC 75 03/19/2010   Lab Results  Component Value Date   TRIG 76.0 03/08/2012   TRIG 82.0 03/21/2011   TRIG 85.0 03/19/2010   Lab Results  Component Value Date   CHOLHDL 3 03/08/2012   CHOLHDL 3 03/21/2011   CHOLHDL 4 03/19/2010   No results found for this basename: LDLDIRECT    Lab Results  Component Value Date   DDIMER  Value: 0.63        AT THE INHOUSE ESTABLISHED CUTOFF VALUE OF 0.48 ug/mL FEU, THIS ASSAY HAS BEEN DOCUMENTED IN THE LITERATURE TO HAVE A SENSITIVITY AND NEGATIVE PREDICTIVE VALUE OF AT LEAST 98 TO 99%.  THE TEST RESULT SHOULD BE CORRELATED WITH AN ASSESSMENT OF THE CLINICAL PROBABILITY OF DVT / VTE.* 07/22/2010     Radiology/Studies: Dg Chest 2 View 02/25/2013   CLINICAL DATA:  Shortness of Breath. Chest pain.  EXAM: CHEST  2 VIEW  COMPARISON:  07/22/2010  FINDINGS: Reticular lung base scarring is stable. The lungs are otherwise clear.  The cardiac silhouette is normal in size and configuration. The mediastinum is normal in  contour and caliber. There are no hilar masses.  No pleural effusion or pneumothorax.  The bony thorax is intact.  IMPRESSION: No acute cardiopulmonary disease.   Electronically Signed   By: Amie Portland   On: 02/25/2013 17:24   Physical Exam: Filed Vitals:   02/26/13 0300 02/26/13 0400 02/26/13 0500 02/26/13 0600  BP: 146/46 140/68 122/53 139/56  Pulse: 50 27 49 49  Temp:      TempSrc:      Resp: 16 15 17 13   SpO2: 96% 95% 94% 96%    GEN- The patient is well appearing, alert and oriented x 3 today.   Head- normocephalic, atraumatic Eyes-  Sclera clear, conjunctiva pink Ears- hearing intact Oropharynx- clear Neck- supple, no JVP Lymph- no cervical lymphadenopathy Lungs- Clear to ausculation bilaterally, normal work of breathing Heart- Regular rate and rhythm, no murmurs, rubs or gallops, PMI not laterally displaced GI- soft, NT, ND, + BS Extremities- no clubbing, cyanosis, or edema MS- no significant deformity or atrophy Skin- no rash or lesion Psych- euthymic mood, full affect Neuro- strength and sensation are intact  ZOX:WRUEA rhythm, rate 63, RBBB  TELEMETRY: sinus bradycardia with PAC's  Assessment and Plan:  1. Sick sinus syndrome The patient presents with documented bradycardia and symptoms of presyncope similar to his previous presyncopal symptoms.  I suspect that he will need a PPM.  At this point, his echo is pending.  If his EF is normal, then I would recommend proceeding with a PPM.  Will make this decision after his echo is reviewed.  No reversible causes for bradycardia have been identified.  2. dypsnea- possibly due to chronotropic incompetence.  Dr Shirlee Latch does not suspect ACS.  Obtain echo.  If EF is normal then proceed with PPM and reassess symptoms post PPM.  If EF is depressed then further inpatient workup will be required.  3. CAD Resume home medicines He has not been on a beta blocker recently.   Addendum:  EF is normal.  Would therefore treat with  PPM for sick sinus syndrome with symptomatic bradycardia.  If his dyspnea does not improve then outpatient stress testing could be performed.  Risks, benefits, alternatives to pacemaker implantation were discussed in detail with the patient today. The patient understands that the risks include but are not limited to bleeding, infection, pneumothorax, perforation, tamponade, vascular damage, renal failure, MI, stroke, death,  and lead dislodgement and wishes to proceed. We will therefore schedule the procedure at the next available time.

## 2013-02-26 NOTE — Progress Notes (Signed)
Utilization Review Completed.Emiline Mancebo T9/16/2014  

## 2013-02-26 NOTE — Op Note (Signed)
SURGEON:  Hillis Range, MD     PREPROCEDURE DIAGNOSIS:  Symptomatic Bradycardia, sick sinus syndrome    POSTPROCEDURE DIAGNOSIS:  Symptomatic Bradycardia, sick sinus syndrome     PROCEDURES:   1.   Pacemaker implantation.     INTRODUCTION: Nicholas Schmitt is a 77 y.o. male  with a history of sick sinus syndrome who presents today for pacemaker implantation.  The patient reports intermittent episodes of presyncope over the past few weeks.  He also reports fatigue and dyspnea.  No reversible causes have been identified.  The patient therefore presents today for pacemaker implantation.     DESCRIPTION OF PROCEDURE:  Informed written consent was obtained, and the patient was brought to the electrophysiology lab in a fasting state.  The received IV Versed and Fentanyl as sedation for the procedure today.  The patients left chest was prepped and draped in the usual sterile fashion by the EP lab staff. The skin overlying the left deltopectoral region was infiltrated with lidocaine for local analgesia.  A 4-cm incision was made over the left deltopectoral region.  A left subcutaneous pacemaker pocket was fashioned using a combination of sharp and blunt dissection. Electrocautery was required to assure hemostasis.     RA/RV Lead Placement: The left axillary vein was cannulated.  No contrast was required for the procedure today. Through the left axillary vein, a Medtronic model [id number] (serial number [id number]) right atrial lead and a Medtronic model 5092- 58 (serial number [id number]) right ventricular lead were advanced   with fluoroscopic visualization into the right atrial appendage and right ventricular apex positions respectively.  Initial atrial lead P- waves measured 2 mV with impedance of 481 ohms and a threshold of 0.3 V at 0.5 msec.  Right ventricular lead R-waves measured 12 mV with an impedance of 795 ohms and a threshold of 0.4 V at 0.5 msec.  Both leads were secured to the pectoralis fascia  using #2-0 silk over the suture sleeves.   Device Placement:  The leads were then connected to a Medtronic Adapta L model ADDRL 1 (serial number [id number]) pacemaker.  The pocket was irrigated with copious gentamicin solution.  The pacemaker was then placed into the pocket.  The pocket was then closed in 2 layers with 2.0 Vicryl suture for the subcutaneous and subcuticular layers.  Steri- Strips and a sterile dressing were then applied.  There were no early apparent complications.     CONCLUSIONS:   1. Successful implantation of a Medtronic Adapta L dual-chamber pacemaker for symptomatic sick sinus syndrome  2. No early apparent complications.           Hillis Range, MD 02/26/2013 6:09 PM

## 2013-02-26 NOTE — Progress Notes (Signed)
Prep done on chest and bil axillary area, hibiclens cleansing x4 done to chest area.

## 2013-02-26 NOTE — H&P (View-Only) (Signed)
  ELECTROPHYSIOLOGY CONSULT NOTE    Patient ID: Nicholas Schmitt MRN: [id number], DOB/AGE: 09/04/1935 77 y.o.  Admit date: 02/25/2013 Date of Consult: 02-26-2013  Primary Physician: [name],[name] ALLEN, MD Primary Cardiologist: Dalton McLean, MD  Reason for Consultation: bradycardia and pre-syncope  HPI:  Nicholas Schmitt is a 77 year old male with a past medical history significant for coronary disease (PCI to RCA in 01/2005), hypertension, hyperlipidemia, and asthma/COPD.   His last cardiology follow up was in 2011 at which time an echo was done which revealed normal EF.    He came to the ER yesterday for evaluation of pre-syncope.  His first episode was about a year ago and then he has had several episodes in the last 3 weeks.  These spells are not related to position and last about 30 seconds.  He feels like his vision is going black when they occur. He has never had frank syncope.  While in the ER, his heart rate transiently dropped into the 30's, notes report symptoms with this, but the patient does not remember any.   He has also had increased dyspnea on exertion in the last few months and decreased exercise tolerance with incline.   He denies chest pain, palpitations, recent fevers or chills.  ROS is otherwise negative except as outlined above.  Lab work this admission is notable for negative cardiac enzymes, normal electrolytes, TSH pending.  Repeat echo pending this admission. He has been on no AV nodal blocking medications.   EP has been asked to evaluate for treatment options.   Past Medical History  Diagnosis Date  . Hypertension   . CAD (coronary artery disease)     positive stress test in 2006 led to left heart cath (8/06) showing 95% prox RCA, 90% CFX, and 90% mLAD.  patient had a cypher DES to all 3 lesions  . Hyperlipidemia   . Hearing loss   . Cancer     prostate Tx with prostatectomy  . AAA (abdominal aortic aneurysm) 11/09    3.2 cm   . Asthma      Surgical  History:  Past Surgical History  Procedure Laterality Date  . Prostatectomy  2005  . Ptca  2006  . Coronary stent placement       Prescriptions prior to admission  Medication Sig Dispense Refill  . albuterol (PROVENTIL,VENTOLIN) 90 MCG/ACT inhaler Inhale 2 puffs into the lungs every 6 (six) hours as needed.  17 g  3  . amLODipine (NORVASC) 10 MG tablet Take 1 tablet (10 mg total) by mouth daily.  100 tablet  3  . aspirin 81 MG tablet Take 81 mg by mouth 2 (two) times daily.        . atorvastatin (LIPITOR) 40 MG tablet Take 1 tablet (40 mg total) by mouth daily.  100 tablet  3  . budesonide-formoterol (SYMBICORT) 160-4.5 MCG/ACT inhaler Inhale 1 puff into the lungs as needed (shortness of breath).      . cholecalciferol (VITAMIN D) 400 UNITS TABS tablet Take 1,000 Units by mouth daily.      . clopidogrel (PLAVIX) 75 MG tablet Take 1 tablet (75 mg total) by mouth daily.  100 tablet  3  . fish oil-omega-3 fatty acids 1000 MG capsule Take 2 g by mouth daily.        . losartan (COZAAR) 100 MG tablet Take 1 tablet (100 mg total) by mouth daily.  100 tablet  3  . Multiple Vitamin (MULTIVITAMIN) tablet Take 1   tablet by mouth daily.        . nitroGLYCERIN (NITROSTAT) 0.4 MG SL tablet Place 1 tablet (0.4 mg total) under the tongue every 5 (five) minutes as needed.  25 tablet  1    Inpatient Medications:  . amLODipine  10 mg Oral Daily  . aspirin EC  81 mg Oral Daily  . atorvastatin  40 mg Oral Daily  . clopidogrel  75 mg Oral Q breakfast  . heparin  5,000 Units Subcutaneous Q8H  . losartan  100 mg Oral Daily  . sodium chloride  3 mL Intravenous Q12H    Allergies: No Known Allergies  History   Social History  . Marital Status: Divorced    Spouse Name: N/A    Number of Children: N/A  . Years of Education: N/A   Occupational History  . Not on file.   Social History Main Topics  . Smoking status: Former Smoker  . Smokeless tobacco: Not on file  . Alcohol Use: No  . Drug Use: No    . Sexual Activity: Not on file   Other Topics Concern  . Not on file   Social History Narrative  . No narrative on file     Family History  Problem Relation Age of Onset  . Asthma Sister   . Asthma Brother   . Coronary artery disease Other       Labs:   Lab Results  Component Value Date   WBC 4.7 02/26/2013   HGB 13.6 02/26/2013   HCT 40.2 02/26/2013   MCV 90.7 02/26/2013   PLT 141* 02/26/2013    Recent Labs Lab 02/25/13 1626  NA 138  K 3.9  CL 104  CO2 23  BUN 19  CREATININE 1.21  CALCIUM 9.7  PROT 6.8  BILITOT 1.0  ALKPHOS 91  ALT 22  AST 24  GLUCOSE 131*   Lab Results  Component Value Date   TROPONINI <0.30 02/26/2013   Lab Results  Component Value Date   CHOL 135 03/08/2012   CHOL 131 03/21/2011   CHOL 128 03/19/2010   Lab Results  Component Value Date   HDL 42.60 03/08/2012   HDL 37.70* 03/21/2011   HDL 36.40* 03/19/2010   Lab Results  Component Value Date   LDLCALC 77 03/08/2012   LDLCALC 77 03/21/2011   LDLCALC 75 03/19/2010   Lab Results  Component Value Date   TRIG 76.0 03/08/2012   TRIG 82.0 03/21/2011   TRIG 85.0 03/19/2010   Lab Results  Component Value Date   CHOLHDL 3 03/08/2012   CHOLHDL 3 03/21/2011   CHOLHDL 4 03/19/2010   No results found for this basename: LDLDIRECT    Lab Results  Component Value Date   DDIMER  Value: 0.63        AT THE INHOUSE ESTABLISHED CUTOFF VALUE OF 0.48 ug/mL FEU, THIS ASSAY HAS BEEN DOCUMENTED IN THE LITERATURE TO HAVE A SENSITIVITY AND NEGATIVE PREDICTIVE VALUE OF AT LEAST 98 TO 99%.  THE TEST RESULT SHOULD BE CORRELATED WITH AN ASSESSMENT OF THE CLINICAL PROBABILITY OF DVT / VTE.* 07/22/2010     Radiology/Studies: Dg Chest 2 View 02/25/2013   CLINICAL DATA:  Shortness of Breath. Chest pain.  EXAM: CHEST  2 VIEW  COMPARISON:  07/22/2010  FINDINGS: Reticular lung base scarring is stable. The lungs are otherwise clear.  The cardiac silhouette is normal in size and configuration. The mediastinum is normal in  contour and caliber. There are no hilar masses.    No pleural effusion or pneumothorax.  The bony thorax is intact.  IMPRESSION: No acute cardiopulmonary disease.   Electronically Signed   By: David  Ormond   On: 02/25/2013 17:24   Physical Exam: Filed Vitals:   02/26/13 0300 02/26/13 0400 02/26/13 0500 02/26/13 0600  BP: 146/46 140/68 122/53 139/56  Pulse: 50 27 49 49  Temp:      TempSrc:      Resp: 16 15 17 13  SpO2: 96% 95% 94% 96%    GEN- The patient is well appearing, alert and oriented x 3 today.   Head- normocephalic, atraumatic Eyes-  Sclera clear, conjunctiva pink Ears- hearing intact Oropharynx- clear Neck- supple, no JVP Lymph- no cervical lymphadenopathy Lungs- Clear to ausculation bilaterally, normal work of breathing Heart- Regular rate and rhythm, no murmurs, rubs or gallops, PMI not laterally displaced GI- soft, NT, ND, + BS Extremities- no clubbing, cyanosis, or edema MS- no significant deformity or atrophy Skin- no rash or lesion Psych- euthymic mood, full affect Neuro- strength and sensation are intact  EKG:sinus rhythm, rate 63, RBBB  TELEMETRY: sinus bradycardia with PAC's  Assessment and Plan:  1. Sick sinus syndrome The patient presents with documented bradycardia and symptoms of presyncope similar to his previous presyncopal symptoms.  I suspect that he will need a PPM.  At this point, his echo is pending.  If his EF is normal, then I would recommend proceeding with a PPM.  Will make this decision after his echo is reviewed.  No reversible causes for bradycardia have been identified.  2. dypsnea- possibly due to chronotropic incompetence.  Dr McLean does not suspect ACS.  Obtain echo.  If EF is normal then proceed with PPM and reassess symptoms post PPM.  If EF is depressed then further inpatient workup will be required.  3. CAD Resume home medicines He has not been on a beta blocker recently.   Addendum:  EF is normal.  Would therefore treat with  PPM for sick sinus syndrome with symptomatic bradycardia.  If his dyspnea does not improve then outpatient stress testing could be performed.  Risks, benefits, alternatives to pacemaker implantation were discussed in detail with the patient today. The patient understands that the risks include but are not limited to bleeding, infection, pneumothorax, perforation, tamponade, vascular damage, renal failure, MI, stroke, death,  and lead dislodgement and wishes to proceed. We will therefore schedule the procedure at the next available time.  

## 2013-02-26 NOTE — Interval H&P Note (Signed)
History and Physical Interval Note:  02/26/2013 3:41 PM  Nicholas Schmitt  has presented today for surgery, with the diagnosis of bradicardia  The various methods of treatment have been discussed with the patient and family. After consideration of risks, benefits and other options for treatment, the patient has consented to  Procedure(s): PERMANENT PACEMAKER INSERTION (N/A) as a surgical intervention .  The patient's history has been reviewed, patient examined, no change in status, stable for surgery.  I have reviewed the patient's chart and labs.  Questions were answered to the patient's satisfaction.     Hillis Range

## 2013-02-26 NOTE — Progress Notes (Signed)
To EP lab by bed, stable. Antibiotics sent with pt.

## 2013-02-26 NOTE — Progress Notes (Signed)
  Echocardiogram 2D Echocardiogram has been performed.  Arvil Chaco 02/26/2013, 10:28 AM

## 2013-02-27 ENCOUNTER — Inpatient Hospital Stay (HOSPITAL_COMMUNITY): Payer: Medicare Other

## 2013-02-27 NOTE — Discharge Summary (Signed)
ELECTROPHYSIOLOGY DISCHARGE SUMMARY    Patient ID: Nicholas Schmitt,  MRN: [id number], DOB/AGE: Aug 05, 1935 77 y.o.  Admit date: 02/25/2013 Discharge date: 02/27/2013  Primary Care Physician: Kelle Darting, MD Primary Cardiologist / EP: Shirlee Latch, MD / Johney Frame, MD  Primary Discharge Diagnosis:  1. Symptomatic bradycardia with sinus node dysfunction s/p PPM implantation  Secondary Discharge Diagnoses:  1. CAD 2. HTN 3. Dyslipidemia 4. Asthma/COPD 5. AAA, measuring 3.2 cm 6. Hearing loss  Procedures This Admission:  1. Dual chamber PPM implantation RA lead -- Medtronic model [id number] (serial number [id number]) right atrial lead  RV lead -- Medtronic model 5092- 58 (serial number [id number]) right ventricular lead  Device -- Medtronic Adapta L model ADDRL 1 (serial number [id number]) pacemaker  History and Hospital Course:  Nicholas Schmitt is a 77 y.o. male with sick sinus syndrome who reports intermittent episodes of presyncope over the past few weeks. He also reports fatigue and dyspnea. No reversible causes have been identified. Mr. Doell therefore presented yesterday for pacemaker implantation. He tolerated this procedure well without any immediate complication. He remains hemodynamically stable and afebrile. His chest xray shows stable lead placement without pneumothorax. His device interrogation shows normal PPM function with stable lead parameters/measurements. His implant site is intact without significant bleeding or hematoma. He has been given discharge instructions including wound care and activity restrictions. He will follow-up in 10 days for wound check. There were no changes made to his medications. He has been seen, examined and deemed stable for discharge today by Dr. Hillis Range.  Physical Exam: Vitals: Blood pressure 135/69, pulse 61, temperature 98.6 F (37 C), temperature source Axillary, resp. rate 15, height 5\' 6"  (1.676 m), weight 215 lb 6.2 oz (97.7 kg), SpO2  94.00%.  Gen- well developed, well appearing 77 yo male in no acute distress  Neck- supple, no JVP  Lungs- clear to ausculation bilaterally without wheezes, rales or rhonchi; normal work of breathing  Heart- regular rate and rhythm, no murmurs, rubs or gallops; PMI not laterally displaced  Abd- soft, NT, ND, + BS  Ext- no clubbing, cyanosis, or edema  Skin- left upper chest / implant site intact without bleeding or hematoma Neuro- strength and sensation are intact; nonfocal  Labs: Lab Results  Component Value Date   WBC 4.7 02/26/2013   HGB 13.6 02/26/2013   HCT 40.2 02/26/2013   MCV 90.7 02/26/2013   PLT 141* 02/26/2013     Recent Labs Lab 02/25/13 1626 02/26/13 0525  NA 138 142  K 3.9 4.6  CL 104 106  CO2 23 28  BUN 19 15  CREATININE 1.21 0.87  CALCIUM 9.7 9.6  PROT 6.8  --   BILITOT 1.0  --   ALKPHOS 91  --   ALT 22  --   AST 24  --   GLUCOSE 131* 96   Lab Results  Component Value Date   TROPONINI <0.30 02/26/2013    Disposition:  The patient is being discharged in stable condition.  Follow-up:     Follow-up Information   Follow up with [facility] On 03/07/2013. (At 4:00 PM for wound check)    Specialty:  Electrophysiology   Contact information:   [address], [address] [place] [address] [phone]      Follow up with Hillis Range, MD On 05/31/2013. (At 10:30 AM)    Specialty:  Cardiology   Contact information:   [address] [place] [address] [phone]  Discharge Medications:    Medication List         albuterol 90 MCG/ACT inhaler  Commonly known as:  PROVENTIL,VENTOLIN  Inhale 2 puffs into the lungs every 6 (six) hours as needed.     amLODipine 10 MG tablet  Commonly known as:  NORVASC  Take 1 tablet (10 mg total) by mouth daily.     aspirin 81 MG tablet  Take 81 mg by mouth 2 (two) times daily.     atorvastatin 40 MG tablet  Commonly known as:  LIPITOR  Take 1 tablet  (40 mg total) by mouth daily.     budesonide-formoterol 160-4.5 MCG/ACT inhaler  Commonly known as:  SYMBICORT  Inhale 1 puff into the lungs as needed (shortness of breath).     cholecalciferol 400 UNITS Tabs tablet  Commonly known as:  VITAMIN D  Take 1,000 Units by mouth daily.     clopidogrel 75 MG tablet  Commonly known as:  PLAVIX  Take 1 tablet (75 mg total) by mouth daily.     fish oil-omega-3 fatty acids 1000 MG capsule  Take 2 g by mouth daily.     losartan 100 MG tablet  Commonly known as:  COZAAR  Take 1 tablet (100 mg total) by mouth daily.     multivitamin tablet  Take 1 tablet by mouth daily.     nitroGLYCERIN 0.4 MG SL tablet  Commonly known as:  NITROSTAT  Place 1 tablet (0.4 mg total) under the tongue every 5 (five) minutes as needed.       Duration of Discharge Encounter: Greater than 30 minutes including physician time.  Signed, Rick Duff, PA-C 02/27/2013, 7:11 AM  Device interrogation is reviewed this am and is normal.  CXR reveals no ptx and stable leads. Will DC to home.  Dr Shirlee Latch to reassess dyspnea in the office and make a decision about further workup if indicated. Pt instructed to followup with PCP for further workup of mildly elevated TSH.  Routine wound care and follow-up  Hillis Range MD

## 2013-03-01 ENCOUNTER — Encounter (INDEPENDENT_AMBULATORY_CARE_PROVIDER_SITE_OTHER): Payer: Medicare Other

## 2013-03-01 DIAGNOSIS — I714 Abdominal aortic aneurysm, without rupture: Secondary | ICD-10-CM

## 2013-03-07 ENCOUNTER — Ambulatory Visit (INDEPENDENT_AMBULATORY_CARE_PROVIDER_SITE_OTHER): Payer: Medicare Other | Admitting: *Deleted

## 2013-03-07 DIAGNOSIS — I499 Cardiac arrhythmia, unspecified: Secondary | ICD-10-CM

## 2013-03-07 LAB — PACEMAKER DEVICE OBSERVATION
AL IMPEDENCE PM: 495 Ohm
BATTERY VOLTAGE: 2.8 V
RV LEAD AMPLITUDE: 11.2 mv
VENTRICULAR PACING PM: 3

## 2013-03-07 NOTE — Progress Notes (Signed)
Wound check appointment. Steri-strips removed. Wound without redness or edema. Incision edges approximated, wound well healed. Normal device function. Thresholds, sensing, and impedances consistent with implant measurements. Device programmed at 3.5V/auto capture programmed on for extra safety margin until 3 month visit. Histogram distribution appropriate for patient and level of activity. 1 mode switch, <1 minute.  No high ventricular rates noted. Patient educated about wound care, arm mobility, lifting restrictions. ROV in 3 months with implanting physician. 

## 2013-03-12 ENCOUNTER — Telehealth: Payer: Self-pay | Admitting: Family Medicine

## 2013-03-12 NOTE — Telephone Encounter (Signed)
Pt called and stated that his cardiologist recently seen him for his pace maker. He states that his doctor requested that he has his thyroid checked. The pt states that he did not receive a time frame. Please advise if he should be brought in prior to his CPX. Thank you!

## 2013-03-12 NOTE — Telephone Encounter (Signed)
Spoke with patient and he wants to wait until his CPX.  Lab was abnormal for TSH.

## 2013-03-16 ENCOUNTER — Encounter: Payer: Self-pay | Admitting: Internal Medicine

## 2013-04-05 ENCOUNTER — Other Ambulatory Visit (INDEPENDENT_AMBULATORY_CARE_PROVIDER_SITE_OTHER): Payer: Medicare Other

## 2013-04-05 DIAGNOSIS — I10 Essential (primary) hypertension: Secondary | ICD-10-CM

## 2013-04-05 DIAGNOSIS — Z0000 Encounter for general adult medical examination without abnormal findings: Secondary | ICD-10-CM

## 2013-04-05 LAB — POCT URINALYSIS DIPSTICK
Bilirubin, UA: NEGATIVE
Blood, UA: NEGATIVE
Leukocytes, UA: NEGATIVE
Nitrite, UA: NEGATIVE
Protein, UA: NEGATIVE
pH, UA: 6

## 2013-04-05 LAB — HEPATIC FUNCTION PANEL
ALT: 22 U/L (ref 0–53)
Albumin: 4 g/dL (ref 3.5–5.2)
Total Protein: 6.6 g/dL (ref 6.0–8.3)

## 2013-04-05 LAB — CBC WITH DIFFERENTIAL/PLATELET
Basophils Relative: 0.7 % (ref 0.0–3.0)
Eosinophils Absolute: 0.3 10*3/uL (ref 0.0–0.7)
Eosinophils Relative: 7.8 % — ABNORMAL HIGH (ref 0.0–5.0)
HCT: 41.8 % (ref 39.0–52.0)
Hemoglobin: 14.2 g/dL (ref 13.0–17.0)
Lymphs Abs: 1.5 10*3/uL (ref 0.7–4.0)
MCHC: 34 g/dL (ref 30.0–36.0)
MCV: 92.9 fl (ref 78.0–100.0)
Monocytes Absolute: 0.5 10*3/uL (ref 0.1–1.0)
Neutro Abs: 2 10*3/uL (ref 1.4–7.7)
RBC: 4.5 Mil/uL (ref 4.22–5.81)
WBC: 4.4 10*3/uL — ABNORMAL LOW (ref 4.5–10.5)

## 2013-04-05 LAB — BASIC METABOLIC PANEL
CO2: 29 mEq/L (ref 19–32)
Chloride: 106 mEq/L (ref 96–112)
Creatinine, Ser: 0.8 mg/dL (ref 0.4–1.5)
Potassium: 5 mEq/L (ref 3.5–5.1)
Sodium: 141 mEq/L (ref 135–145)

## 2013-04-05 LAB — LIPID PANEL
HDL: 38.1 mg/dL — ABNORMAL LOW (ref >39.00)
Total CHOL/HDL Ratio: 3
VLDL: 13.8 mg/dL (ref 0.0–40.0)

## 2013-04-05 LAB — PSA: PSA: 3.83 ng/mL (ref 0.10–4.00)

## 2013-04-16 ENCOUNTER — Encounter: Payer: Self-pay | Admitting: Family Medicine

## 2013-04-16 ENCOUNTER — Ambulatory Visit (INDEPENDENT_AMBULATORY_CARE_PROVIDER_SITE_OTHER): Payer: Medicare Other | Admitting: Family Medicine

## 2013-04-16 VITALS — BP 120/80 | Temp 98.1°F | Ht 66.0 in | Wt 218.0 lb

## 2013-04-16 DIAGNOSIS — I2510 Atherosclerotic heart disease of native coronary artery without angina pectoris: Secondary | ICD-10-CM

## 2013-04-16 DIAGNOSIS — I714 Abdominal aortic aneurysm, without rupture: Secondary | ICD-10-CM

## 2013-04-16 DIAGNOSIS — K810 Acute cholecystitis: Secondary | ICD-10-CM

## 2013-04-16 DIAGNOSIS — Z23 Encounter for immunization: Secondary | ICD-10-CM

## 2013-04-16 DIAGNOSIS — I10 Essential (primary) hypertension: Secondary | ICD-10-CM

## 2013-04-16 DIAGNOSIS — E785 Hyperlipidemia, unspecified: Secondary | ICD-10-CM

## 2013-04-16 DIAGNOSIS — I495 Sick sinus syndrome: Secondary | ICD-10-CM

## 2013-04-16 DIAGNOSIS — J45909 Unspecified asthma, uncomplicated: Secondary | ICD-10-CM

## 2013-04-16 DIAGNOSIS — Z8546 Personal history of malignant neoplasm of prostate: Secondary | ICD-10-CM

## 2013-04-16 DIAGNOSIS — Z0000 Encounter for general adult medical examination without abnormal findings: Secondary | ICD-10-CM

## 2013-04-16 DIAGNOSIS — H900 Conductive hearing loss, bilateral: Secondary | ICD-10-CM

## 2013-04-16 MED ORDER — BUDESONIDE-FORMOTEROL FUMARATE 160-4.5 MCG/ACT IN AERO: 1.0000 | INHALATION_SPRAY | RESPIRATORY_TRACT | Status: DC | PRN

## 2013-04-16 MED ORDER — LOSARTAN POTASSIUM 100 MG PO TABS: 100.0000 mg | ORAL_TABLET | Freq: Every day | ORAL | Status: DC

## 2013-04-16 MED ORDER — CLOPIDOGREL BISULFATE 75 MG PO TABS
75.0000 mg | ORAL_TABLET | Freq: Every day | ORAL | Status: DC
Start: 2013-04-16 — End: 2013-04-18

## 2013-04-16 MED ORDER — AMLODIPINE BESYLATE 10 MG PO TABS: 10.0000 mg | ORAL_TABLET | Freq: Every day | ORAL | Status: DC

## 2013-04-16 MED ORDER — ATORVASTATIN CALCIUM 40 MG PO TABS: 40.0000 mg | ORAL_TABLET | Freq: Every day | ORAL | Status: DC

## 2013-04-16 MED ORDER — ALBUTEROL SULFATE HFA 108 (90 BASE) MCG/ACT IN AERS: 2.0000 | INHALATION_SPRAY | Freq: Four times a day (QID) | RESPIRATORY_TRACT | Status: DC | PRN

## 2013-04-16 MED ORDER — NITROGLYCERIN 0.4 MG SL SUBL: 0.4000 mg | SUBLINGUAL_TABLET | SUBLINGUAL | Status: DC | PRN

## 2013-04-16 NOTE — Progress Notes (Signed)
  Subjective:    Patient ID: Nicholas Schmitt, male    DOB: 1936/05/13, 77 y.o.   MRN: [id number]  HPI Nicholas Schmitt is a 77 year old male who comes in today for a Medicare wellness examination because of a history of underlying COPD, hypertension, status post coronary artery disease with stents, recently had a pacemaker put in 7 weeks ago for sick sinus syndrome. By Dr. Johney Schmitt  He gets routine eye care, dental care,,,,,,,,,,, upper and lower dentures,,,,,, colonoscopy and GI 2 years ago normal, vaccinations up-to-date  Cognitive function normal he walks on a regular basis home health safety reviewed no issues identified, no guns in the house, he does have a health care power of attorney and living will  His lab work showed a slightly elevated TSH a month ago 6.6 today it's 4.35 which is normal. He's never had trouble his thyroid in the past   Review of Systems  Constitutional: Negative.   HENT: Negative.   Eyes: Negative.   Respiratory: Negative.   Cardiovascular: Negative.   Gastrointestinal: Negative.   Endocrine: Negative.   Genitourinary: Negative.   Musculoskeletal: Negative.   Skin: Negative.   Allergic/Immunologic: Negative.   Neurological: Negative.   Hematological: Negative.   Psychiatric/Behavioral: Negative.        Objective:   Physical Exam  Nursing note and vitals reviewed. Constitutional: He is oriented to person, place, and time. He appears well-developed and well-nourished. No distress.  HENT:  Head: Normocephalic and atraumatic.  Right Ear: External ear normal.  Left Ear: External ear normal.  Nose: Nose normal.  Mouth/Throat: Oropharynx is clear and moist.    Upper and lower dentures Profound hearing loss  Eyes: Conjunctivae and EOM are normal. Pupils are equal, round, and reactive to light.  Neck: Normal range of motion. Neck supple. No JVD present. No tracheal deviation present. No thyromegaly present.  Soft left carotid bruit  Cardiovascular: Normal rate,  regular rhythm, normal heart sounds and intact distal pulses.  Exam reveals no gallop and no friction rub.   No murmur heard. Pulmonary/Chest: Effort normal and breath sounds normal. No stridor. No respiratory distress. He has no wheezes. He has no rales. He exhibits no tenderness.  Pacemaker left upper anterior chest wall  Abdominal: Soft. Bowel sounds are normal. He exhibits no distension and no mass. There is no tenderness. There is no rebound and no guarding.  Genitourinary:  Deferred to Dr. Aldean Schmitt..........Marland KitchenSurgical removal 8 years ago for prostate cancer  Musculoskeletal: Normal range of motion. He exhibits no edema and no tenderness.  Lymphadenopathy:    He has no cervical adenopathy.  Neurological: He is alert and oriented to person, place, and time. He has normal reflexes. No cranial nerve deficit. He exhibits normal muscle tone.  Skin: Skin is warm and dry. No rash noted. He is not diaphoretic. No erythema. No pallor.  Psychiatric: He has a normal mood and affect. His behavior is normal. Judgment and thought content normal.   Abnormal lesion left ear return for removal ASAP       Assessment & Plan:  Coronary disease asymptomatic  Sick sinus syndrome with syncope resolved with pacemaker insertion 7 weeks ago by Dr. Johney Schmitt  COPD continue Symbicort and albuterol when necessary  Hypertension continue current blood pressure medications BP 120/80  History of a stent continue Plavix  Bilateral hearing loss,,,,,,,,,,,, advised to wear his hearing aids continuously  Rising PSA refer back to Dr. Lou Schmitt  Abnormal lesion left ear return for removal ASAP

## 2013-04-16 NOTE — Patient Instructions (Signed)
Call Dr. Lou Cal at the [facility] for consultation because your PSA has now risen to 3.83  Followup with me in one year sooner if any problems  I would advise you to wear year hearing aids continuously......... if they don't work then go to [facility] for an hearing evaluation  Continue to walk daily

## 2013-04-18 ENCOUNTER — Other Ambulatory Visit: Payer: Self-pay | Admitting: *Deleted

## 2013-04-18 DIAGNOSIS — I2510 Atherosclerotic heart disease of native coronary artery without angina pectoris: Secondary | ICD-10-CM

## 2013-04-18 DIAGNOSIS — E785 Hyperlipidemia, unspecified: Secondary | ICD-10-CM

## 2013-04-18 DIAGNOSIS — I10 Essential (primary) hypertension: Secondary | ICD-10-CM

## 2013-04-18 MED ORDER — CLOPIDOGREL BISULFATE 75 MG PO TABS: 75.0000 mg | ORAL_TABLET | Freq: Every day | ORAL | Status: DC

## 2013-04-18 MED ORDER — ATORVASTATIN CALCIUM 40 MG PO TABS: 40.0000 mg | ORAL_TABLET | Freq: Every day | ORAL | Status: DC

## 2013-04-18 MED ORDER — LOSARTAN POTASSIUM 100 MG PO TABS: 100.0000 mg | ORAL_TABLET | Freq: Every day | ORAL | Status: DC

## 2013-04-18 MED ORDER — AMLODIPINE BESYLATE 10 MG PO TABS: 10.0000 mg | ORAL_TABLET | Freq: Every day | ORAL | Status: DC

## 2013-04-23 ENCOUNTER — Encounter: Payer: Self-pay | Admitting: Family Medicine

## 2013-04-23 ENCOUNTER — Ambulatory Visit (INDEPENDENT_AMBULATORY_CARE_PROVIDER_SITE_OTHER): Payer: Medicare Other | Admitting: Family Medicine

## 2013-04-23 DIAGNOSIS — D0422 Carcinoma in situ of skin of left ear and external auricular canal: Secondary | ICD-10-CM

## 2013-04-23 DIAGNOSIS — D0420 Carcinoma in situ of skin of unspecified ear and external auricular canal: Secondary | ICD-10-CM

## 2013-04-23 NOTE — Patient Instructions (Signed)
Within 2 weeks [name] or I will call you with the report  Apply ointment to the area that we excise 3 times daily for about 2 weeks

## 2013-04-23 NOTE — Progress Notes (Signed)
  Subjective:    Patient ID: Nicholas Schmitt, male    DOB: 1936/03/27, 77 y.o.   MRN: [id number]  HPI Nicholas Schmitt is a 77 year old male who comes in today for removal of his left earlobe top  He's noticed a lesion there for a while and has become ulcerated and bleeding  It measures 8 mm x 8 mm its ulcerated and bleeding.  After informed consent the lesion was anesthetized with 1% Xylocaine with epinephrine and the lesion was excised with 2 mm margins. The bases cauterized dry sterile dressing was applied he tolerated the procedure no complications.  Clinically it appears to either be a basal cell or squamous cell carcinoma.   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 pathology pending

## 2013-04-24 ENCOUNTER — Other Ambulatory Visit: Payer: Self-pay | Admitting: *Deleted

## 2013-04-24 DIAGNOSIS — E785 Hyperlipidemia, unspecified: Secondary | ICD-10-CM

## 2013-04-24 DIAGNOSIS — I10 Essential (primary) hypertension: Secondary | ICD-10-CM

## 2013-04-24 DIAGNOSIS — J45909 Unspecified asthma, uncomplicated: Secondary | ICD-10-CM

## 2013-04-24 DIAGNOSIS — I2510 Atherosclerotic heart disease of native coronary artery without angina pectoris: Secondary | ICD-10-CM

## 2013-04-24 MED ORDER — ATORVASTATIN CALCIUM 40 MG PO TABS: 40.0000 mg | ORAL_TABLET | Freq: Every day | ORAL | Status: DC

## 2013-04-24 MED ORDER — CLOPIDOGREL BISULFATE 75 MG PO TABS: 75.0000 mg | ORAL_TABLET | Freq: Every day | ORAL | Status: DC

## 2013-04-24 MED ORDER — BUDESONIDE-FORMOTEROL FUMARATE 160-4.5 MCG/ACT IN AERO: 1.0000 | INHALATION_SPRAY | RESPIRATORY_TRACT | Status: DC | PRN

## 2013-04-24 MED ORDER — AMLODIPINE BESYLATE 10 MG PO TABS: 10.0000 mg | ORAL_TABLET | Freq: Every day | ORAL | Status: DC

## 2013-04-24 MED ORDER — LOSARTAN POTASSIUM 100 MG PO TABS: 100.0000 mg | ORAL_TABLET | Freq: Every day | ORAL | Status: DC

## 2013-05-20 ENCOUNTER — Telehealth: Payer: Self-pay | Admitting: Family Medicine

## 2013-05-20 NOTE — Telephone Encounter (Signed)
New fax sent to [facility] with clear directions

## 2013-05-20 NOTE — Telephone Encounter (Signed)
Pt need refill on symbicort #4 sent to [facility]

## 2013-05-24 ENCOUNTER — Telehealth: Payer: Self-pay | Admitting: Cardiology

## 2013-05-24 ENCOUNTER — Other Ambulatory Visit: Payer: Self-pay | Admitting: Urology

## 2013-05-24 NOTE — Telephone Encounter (Signed)
New Message  Pt is in need of a Prostate Ultra Sound and Biopsy in 2 weeks/// Will need clearance to come off of Plavix for 5 days before the surgery .[name] If it is denied or approved will need a faxed confirmation. Requests to schedule this biopsy before the end of the year//   Fax #  [phone]

## 2013-05-24 NOTE — Telephone Encounter (Signed)
Will forward to MD for review and orders 

## 2013-05-29 ENCOUNTER — Other Ambulatory Visit: Payer: Self-pay | Admitting: Urology

## 2013-05-29 DIAGNOSIS — C61 Malignant neoplasm of prostate: Secondary | ICD-10-CM

## 2013-05-29 NOTE — Telephone Encounter (Signed)
OK to hold Plavix, restart after procedure.

## 2013-05-29 NOTE — Telephone Encounter (Signed)
As his last PCI was 2006, it should be reasonable to hold plavix for 5 days if medically necessary for biopsy, though I will defer to Dr Shirlee Latch who appears to be his primary cardiologist.

## 2013-05-30 NOTE — Telephone Encounter (Signed)
Faxed to [facility] at [facility]

## 2013-05-31 ENCOUNTER — Encounter: Payer: Self-pay | Admitting: Internal Medicine

## 2013-05-31 ENCOUNTER — Ambulatory Visit (INDEPENDENT_AMBULATORY_CARE_PROVIDER_SITE_OTHER): Payer: Medicare Other | Admitting: Internal Medicine

## 2013-05-31 VITALS — BP 138/74 | HR 82 | Ht 71.0 in | Wt 223.0 lb

## 2013-05-31 DIAGNOSIS — I10 Essential (primary) hypertension: Secondary | ICD-10-CM

## 2013-05-31 DIAGNOSIS — I495 Sick sinus syndrome: Secondary | ICD-10-CM

## 2013-05-31 DIAGNOSIS — I2510 Atherosclerotic heart disease of native coronary artery without angina pectoris: Secondary | ICD-10-CM

## 2013-05-31 LAB — MDC_IDC_ENUM_SESS_TYPE_INCLINIC
Battery Impedance: 100 Ohm
Battery Voltage: 2.8 V
Brady Statistic AS VS Percent: 13 %
Lead Channel Impedance Value: 517 Ohm
Lead Channel Impedance Value: 593 Ohm
Lead Channel Pacing Threshold Amplitude: 0.75 V
Lead Channel Pacing Threshold Pulse Width: 0.4 ms
Lead Channel Setting Pacing Amplitude: 2 V
Lead Channel Setting Pacing Amplitude: 2.5 V
Lead Channel Setting Pacing Pulse Width: 0.4 ms
Lead Channel Setting Sensing Sensitivity: 5.6 mV

## 2013-05-31 NOTE — Patient Instructions (Signed)
Remote monitoring is used to monitor your Pacemaker or ICD from home. This monitoring reduces the number of office visits required to check your device to one time per year. It allows [place] to keep an eye on the functioning of your device to ensure it is working properly. You are scheduled for a device check from home on 09/02/13. You may send your transmission at any time that day. If you have a wireless device, the transmission will be sent automatically. After your physician reviews your transmission, you will receive a postcard with your next transmission date.    Remote monitoring is used to monitor your Pacemaker of ICD from home. This monitoring reduces the number of office visits required to check your device to one time per year. It allows [place] to keep an eye on the functioning of your device to ensure it is working properly. You are scheduled for a device check from home on 9 months with Dr Johney Frame. You may send your transmission at any time that day. If you have a wireless device, the transmission will be sent automatically. After your physician reviews your transmission, you will receive a postcard with your next transmission date.

## 2013-05-31 NOTE — Progress Notes (Signed)
PCP: Evette Georges, MD Primary Cardiologist:  Dr Craig Staggers is a 77 y.o. male who presents today for routine electrophysiology followup.  Since having his pacemaker implanted, the patient reports doing very well.  Today, he denies symptoms of palpitations, chest pain, shortness of breath,  lower extremity edema, dizziness, presyncope, or syncope.  The patient is otherwise without complaint today.   Past Medical History  Diagnosis Date  . Hypertension   . CAD (coronary artery disease)     positive stress test in 2006 led to left heart cath (8/06) showing 95% prox RCA, 90% CFX, and 90% mLAD.  patient had a cypher DES to all 3 lesions  . Hyperlipidemia   . Hearing loss   . Cancer     prostate Tx with prostatectomy  . AAA (abdominal aortic aneurysm) 11/09    3.2 cm   . Asthma    Past Surgical History  Procedure Laterality Date  . Prostatectomy  2005  . Ptca  2006  . Coronary stent placement    . Permanent pacemaker insertion Bilateral 02/26/13    MDT Adapta L implanted by Dr Johney Frame for sick sinus syndrome    Current Outpatient Prescriptions  Medication Sig Dispense Refill  . albuterol (PROVENTIL HFA;VENTOLIN HFA) 108 (90 BASE) MCG/ACT inhaler Inhale 2 puffs into the lungs every 6 (six) hours as needed for wheezing or shortness of breath.  1 Inhaler  0  . albuterol (PROVENTIL,VENTOLIN) 90 MCG/ACT inhaler Inhale 2 puffs into the lungs every 6 (six) hours as needed.  17 g  3  . amLODipine (NORVASC) 10 MG tablet Take 1 tablet (10 mg total) by mouth daily.  100 tablet  3  . aspirin 81 MG tablet Take 81 mg by mouth 2 (two) times daily. ON HOLD      . atorvastatin (LIPITOR) 40 MG tablet Take 1 tablet (40 mg total) by mouth daily.  100 tablet  3  . budesonide-formoterol (SYMBICORT) 160-4.5 MCG/ACT inhaler Inhale 1 puff into the lungs as needed (shortness of breath).  3 Inhaler  3  . busPIRone (BUSPAR) 7.5 MG tablet Take 7.5 mg by mouth daily.       . cholecalciferol (VITAMIN  D) 400 UNITS TABS tablet Take 1,000 Units by mouth daily. HOLD      . clopidogrel (PLAVIX) 75 MG tablet Take 75 mg by mouth daily. HOLD      . fish oil-omega-3 fatty acids 1000 MG capsule Take 2 g by mouth daily. HOLD      . losartan (COZAAR) 100 MG tablet Take 1 tablet (100 mg total) by mouth daily.  100 tablet  3  . Multiple Vitamin (MULTIVITAMIN) tablet Take 1 tablet by mouth daily. HOLD      . nitroGLYCERIN (NITROSTAT) 0.4 MG SL tablet Place 1 tablet (0.4 mg total) under the tongue every 5 (five) minutes as needed.  25 tablet  1  . PARoxetine (PAXIL) 20 MG tablet Take 20 mg by mouth daily.        No current facility-administered medications for this visit.    Physical Exam: Filed Vitals:   05/31/13 1045  BP: 138/74  Pulse: 82  Height: 5\' 11"  (1.803 m)  Weight: 223 lb (101.152 kg)    GEN- The patient is well appearing, alert and oriented x 3 today.   Head- normocephalic, atraumatic Eyes-  Sclera clear, conjunctiva pink Ears- hearing intact Oropharynx- clear Lungs- Clear to ausculation bilaterally, normal work of breathing Chest- pacemaker pocket is well  healed Heart- Regular rate and rhythm, no murmurs, rubs or gallops, PMI not laterally displaced GI- soft, NT, ND, + BS Extremities- no clubbing, cyanosis, or edema  Pacemaker interrogation- reviewed in detail today,  See PACEART report  Assessment and Plan:  1. Sick sinus syndrome- presyncope is resolved Normal pacemaker function See Pace Art report No changes today  2. Cad Stable No change required today  3. HTN Stable No change required today  [facility] Return in 9 months to the EP clinic

## 2013-06-03 ENCOUNTER — Telehealth: Payer: Self-pay | Admitting: Family Medicine

## 2013-06-03 DIAGNOSIS — I2510 Atherosclerotic heart disease of native coronary artery without angina pectoris: Secondary | ICD-10-CM

## 2013-06-03 DIAGNOSIS — J45909 Unspecified asthma, uncomplicated: Secondary | ICD-10-CM

## 2013-06-03 MED ORDER — BUDESONIDE-FORMOTEROL FUMARATE 160-4.5 MCG/ACT IN AERO: 1.0000 | INHALATION_SPRAY | RESPIRATORY_TRACT | Status: DC | PRN

## 2013-06-03 NOTE — Telephone Encounter (Signed)
Patient says he is about out if Symbicort Inhaler, has 60 puffs per inhaler, is on last one with 20-30 puffs left.  Pulmonary physician wrote first prescription about 1.5 years ago and Dr Tawanna Cooler has written the other prescriptions.  Gets Rx from [facility] - thinks they will be calling office for refill.  Says 4 inhalers will last him all year.  Please review, patient can be contacted at [phone].

## 2013-06-03 NOTE — Telephone Encounter (Signed)
Pt never picked up rx nitroGLYCERIN (NITROSTAT) 0.4 MG SL tablet at [facility] and would like you to [facility] ( pls [facility] due to [facility] requirements)

## 2013-06-03 NOTE — Telephone Encounter (Signed)
Pt wants to know if he can be worked in sometime next week for his skin check, his appt is 1/6, wants to come sooner Please advise.

## 2013-06-03 NOTE — Telephone Encounter (Signed)
Left message on machine for patient unable to work in sooner.

## 2013-06-04 ENCOUNTER — Encounter (HOSPITAL_COMMUNITY): Payer: Self-pay

## 2013-06-04 ENCOUNTER — Other Ambulatory Visit: Payer: Self-pay | Admitting: Urology

## 2013-06-04 ENCOUNTER — Ambulatory Visit (HOSPITAL_COMMUNITY)
Admission: RE | Admit: 2013-06-04 | Discharge: 2013-06-04 | Disposition: A | Discharge status: Home / Self Care | Payer: Medicare Other | Source: Ambulatory Visit | Attending: Urology | Admitting: Urology

## 2013-06-04 DIAGNOSIS — C61 Malignant neoplasm of prostate: Secondary | ICD-10-CM | POA: Insufficient documentation

## 2013-06-04 MED ORDER — LIDOCAINE HCL (PF) 2 % IJ SOLN
INTRAMUSCULAR | Status: AC
  Administered 2013-06-04: 10 mL
  Filled 2013-06-04: qty 10

## 2013-06-04 MED ORDER — GENTAMICIN SULFATE 40 MG/ML IJ SOLN
INTRAMUSCULAR | Status: AC
  Administered 2013-06-04: 160 mg via INTRAMUSCULAR
  Filled 2013-06-04: qty 4

## 2013-06-04 MED ORDER — GENTAMICIN SULFATE 40 MG/ML IJ SOLN
160.0000 mg | Freq: Once | INTRAMUSCULAR | Status: AC
  Administered 2013-06-04: 160 mg via INTRAMUSCULAR

## 2013-06-04 MED ORDER — LIDOCAINE HCL (PF) 2 % IJ SOLN
10.0000 mL | Freq: Once | INTRAMUSCULAR | Status: AC
  Administered 2013-06-04: 10 mL

## 2013-06-05 MED ORDER — NITROGLYCERIN 0.4 MG SL SUBL: 0.4000 mg | SUBLINGUAL_TABLET | SUBLINGUAL | Status: DC | PRN

## 2013-06-05 NOTE — Telephone Encounter (Signed)
Rx sent 

## 2013-06-13 DIAGNOSIS — C801 Malignant (primary) neoplasm, unspecified: Secondary | ICD-10-CM

## 2013-06-13 HISTORY — DX: Malignant (primary) neoplasm, unspecified: C80.1

## 2013-06-18 ENCOUNTER — Encounter: Payer: Self-pay | Admitting: Urology

## 2013-06-18 ENCOUNTER — Encounter: Payer: Self-pay | Admitting: Family Medicine

## 2013-06-18 ENCOUNTER — Ambulatory Visit (INDEPENDENT_AMBULATORY_CARE_PROVIDER_SITE_OTHER): Payer: Medicare Other | Admitting: Family Medicine

## 2013-06-18 VITALS — BP 120/60 | Temp 97.3°F | Wt 224.0 lb

## 2013-06-18 DIAGNOSIS — C44211 Basal cell carcinoma of skin of unspecified ear and external auricular canal: Secondary | ICD-10-CM

## 2013-06-18 DIAGNOSIS — C44219 Basal cell carcinoma of skin of left ear and external auricular canal: Secondary | ICD-10-CM

## 2013-06-18 NOTE — Patient Instructions (Signed)
Return when you're due for your annual exam sooner if any problems

## 2013-06-18 NOTE — Progress Notes (Signed)
   Subjective:    Patient ID: Nicholas Schmitt, male    DOB: 03-30-1936, 78 y.o.   MRN: [id number]  HPI Nicholas Schmitt is a 78 year old male who comes in today for followup of a basal cell carcinoma but left ear  Remove this lesion about 8 weeks ago. It turned out to be a basal cell carcinoma. He's here today for followup  He says he has no complaints is healed   Review of Systems    review of systems negative except as prostate cancer has recurred he's currently undergoing a workup in urology Objective:   Physical Exam Well-developed well-nourished male no acute distress vital signs stable is afebrile examination of the left ear shows a very well healed scar. No evidence of recurrence. The rest ahead exam arms back and chest were normal       Assessment & Plan:  Status post basal cell carcinoma left ear return when necessary

## 2013-06-18 NOTE — Progress Notes (Signed)
Pre visit review using our clinic review tool, if applicable. No additional management support is needed unless otherwise documented below in the visit note. 

## 2013-06-19 ENCOUNTER — Other Ambulatory Visit: Payer: Self-pay | Admitting: Urology

## 2013-06-19 DIAGNOSIS — C61 Malignant neoplasm of prostate: Secondary | ICD-10-CM

## 2013-06-21 ENCOUNTER — Telehealth: Payer: Self-pay | Admitting: Family Medicine

## 2013-06-21 NOTE — Telephone Encounter (Signed)
Received 3 pages from [facility], sent to [name]. 06/21/13/ss.

## 2013-07-02 ENCOUNTER — Encounter (HOSPITAL_COMMUNITY)
Admission: RE | Admit: 2013-07-02 | Discharge: 2013-07-02 | Disposition: A | Discharge status: Home / Self Care | Payer: Medicare Other | Source: Ambulatory Visit | Attending: Urology | Admitting: Urology

## 2013-07-02 ENCOUNTER — Encounter (HOSPITAL_COMMUNITY): Payer: Self-pay

## 2013-07-02 DIAGNOSIS — C61 Malignant neoplasm of prostate: Secondary | ICD-10-CM | POA: Insufficient documentation

## 2013-07-02 MED ORDER — TECHNETIUM TC 99M MEDRONATE IV KIT
25.7000 | PACK | Freq: Once | INTRAVENOUS | Status: AC | PRN
  Administered 2013-07-02: 25.7 via INTRAVENOUS

## 2013-07-26 ENCOUNTER — Telehealth: Payer: Self-pay | Admitting: *Deleted

## 2013-07-30 ENCOUNTER — Telehealth: Payer: Self-pay | Admitting: *Deleted

## 2013-07-30 ENCOUNTER — Encounter: Payer: Self-pay | Admitting: Radiation Oncology

## 2013-07-30 DIAGNOSIS — C61 Malignant neoplasm of prostate: Secondary | ICD-10-CM | POA: Insufficient documentation

## 2013-07-30 DIAGNOSIS — Z923 Personal history of irradiation: Secondary | ICD-10-CM | POA: Insufficient documentation

## 2013-07-30 NOTE — Telephone Encounter (Signed)
Called patient to introduce myself as Prostate Oncology Navigator and coordinator of the Prostate [place], to confirm his referral for the clinic on  08/06/13, location of [place], 12:15 arrival time/registration procedure, format of clinic.  He verbalized understanding.  Provided my phone number and encouraged him to call me if he has any questions after receiving Information Packet or prior to my call the day before clinic.  Patient verbalized understanding.  Gayleen Orem, RN, BSN, [facility] Prostate Oncology Navigator [phone]

## 2013-07-30 NOTE — Progress Notes (Signed)
GU Location of Tumor / Histology: prostate  If Prostate Cancer, Gleason Score is (4 + 3) and PSA is (3.83 on 02/2013) 02/2013 PSA 3.83 2013    PSA 2.66 2012    PSA 1.83 2011    PSA 1.25  Patient presented 2005 with dx of prostate cancer, lost to FU s/p XRT and seed implant in 2005  Biopsies of prostate  06/04/13: Adenocarcinoma, 12/12 cores, Gleason 4+3=7, volume 17 mL  Past/Anticipated interventions by urology, if any: surveillance  Past/Anticipated interventions by medical oncology, if any: none  Weight changes, if any: no  Bowel/Bladder complaints, if any:  Urgency, frequency, nocturia x 2,  IPSS 10  Nausea/Vomiting, if any: no  Pain issues, if any:  Left shoulder, has had 3 Cortisone injections  SAFETY ISSUES:  Prior radiation? YES, XRT 4500 cGy to prostate 03/08/04- 04/08/04, then seed implant on 05/04/2004, Dr Danny Lawless  Pacemaker/ICD? YES  Possible current pregnancy? NA  Is the patient on methotrexate? no  Current Complaints / other details: single, 2 sons, retired from [employer]

## 2013-07-30 NOTE — Telephone Encounter (Signed)
Called patient to provide information re: 2/24 PMDC.  LVM with request to return my call.  Gayleen Orem, RN, BSN, [facility] Prostate Oncology Navigator [phone]

## 2013-08-01 ENCOUNTER — Telehealth: Payer: Self-pay | Admitting: Oncology

## 2013-08-01 NOTE — Telephone Encounter (Signed)
C/D 08/01/13 for appt.08/06/13

## 2013-08-05 ENCOUNTER — Telehealth: Payer: Self-pay | Admitting: *Deleted

## 2013-08-05 NOTE — Telephone Encounter (Signed)
Called patient to see if he had any questions prior to his attendance at tomorrow's Prostate [place].  LVM with a reminder to arrive at 12:15.     Gayleen Orem, RN, BSN, [facility] Prostate Oncology Navigator [phone]

## 2013-08-06 ENCOUNTER — Encounter: Payer: Self-pay | Admitting: Radiation Oncology

## 2013-08-06 ENCOUNTER — Ambulatory Visit (HOSPITAL_BASED_OUTPATIENT_CLINIC_OR_DEPARTMENT_OTHER): Payer: Medicare Other | Admitting: Oncology

## 2013-08-06 ENCOUNTER — Encounter: Payer: Self-pay | Admitting: Oncology

## 2013-08-06 ENCOUNTER — Ambulatory Visit
Admission: RE | Admit: 2013-08-06 | Discharge: 2013-08-06 | Disposition: A | Discharge status: Home / Self Care | Payer: Medicare Other | Source: Ambulatory Visit | Attending: Radiation Oncology | Admitting: Radiation Oncology

## 2013-08-06 ENCOUNTER — Encounter: Payer: Self-pay | Admitting: *Deleted

## 2013-08-06 VITALS — BP 161/82 | HR 81 | Temp 97.4°F | Resp 20 | Wt 224.0 lb

## 2013-08-06 DIAGNOSIS — C61 Malignant neoplasm of prostate: Secondary | ICD-10-CM

## 2013-08-06 DIAGNOSIS — I2510 Atherosclerotic heart disease of native coronary artery without angina pectoris: Secondary | ICD-10-CM

## 2013-08-06 DIAGNOSIS — N189 Chronic kidney disease, unspecified: Secondary | ICD-10-CM

## 2013-08-06 DIAGNOSIS — Z923 Personal history of irradiation: Secondary | ICD-10-CM

## 2013-08-06 DIAGNOSIS — I10 Essential (primary) hypertension: Secondary | ICD-10-CM

## 2013-08-06 HISTORY — DX: Personal history of irradiation: Z92.3

## 2013-08-06 HISTORY — DX: Unspecified malignant neoplasm of skin, unspecified: C44.90

## 2013-08-06 HISTORY — DX: Malignant neoplasm of prostate: C61

## 2013-08-06 HISTORY — DX: Chronic kidney disease, unspecified: N18.9

## 2013-08-06 HISTORY — DX: Acute myocardial infarction, unspecified: I21.9

## 2013-08-06 NOTE — Progress Notes (Signed)
Please see consult note.  

## 2013-08-06 NOTE — Addendum Note (Signed)
Encounter addended by: Dutch Gray, MD on: 08/06/2013  5:47 PM<BR>     Documentation filed: Clinical Notes

## 2013-08-06 NOTE — Consult Note (Signed)
Reason for Referral: Prostate Cancer.  HPI: This is a 78 year old man currently of [place] where he lived the majority of his life. He has a history of prostate cancer diagnosed in 2005. At that time, he had a pathological T1c adenocarcinoma of the prostate with a PSA of 7.7 and a Gleason score 3+4 equals 7. He underwent external beam radiation and seed implants completed on 05/04/2004. His PSA nadir was down to 0.91 in March of 2006. He subsequently has a rise in his PSA over the years and most recently was up to 3.83 in September of 2014. A repeat biopsy done on 06/04/2013 which showed a Gleason score 4+3 equals 7 and 12 out of the 12 cores. No seminal vesicle involvement. And his imaging studies including CT scan and a bone scan all negative. Patient was referred to the prostate cancer multidisciplinary clinic by Dr. Luberta Robertson to discuss treatment options. Clinically he is asymptomatic. He does not report any urination symptoms at this time. He does report occasional nocturia. And some occasional urgency. He does not report any hematuria or incontinence. He does report that diffuse arthralgias and myalgias. Predominantly arthritic in nature in his shoulder and his back. But for the most part able to perform most activities of daily living. He continues to enjoy golf and relatively active.   Past Medical History  Diagnosis Date  . Hypertension   . CAD (coronary artery disease)     positive stress test in 2006 led to left heart cath (8/06) showing 95% prox RCA, 90% CFX, and 90% mLAD.  patient had a cypher DES to all 3 lesions  . Hyperlipidemia   . Hearing loss   . Cancer     prostate Tx with prostatectomy  . AAA (abdominal aortic aneurysm) 11/09    3.2 cm   . Asthma   . Prostate cancer 2005    gleason 7  . Chronic renal insufficiency   . History of radiation therapy 03/08/04- 04/08/04    prostate 4600 cGy in 3 fractions, radioactive seed implant 05/04/04  . Myocardial infarction 1995  .  Skin cancer     ear  :  Past Surgical History  Procedure Laterality Date  . Prostatectomy  2005  . Ptca  2006  . Coronary stent placement    . Permanent pacemaker insertion Bilateral 02/26/13    MDT Adapta L implanted by Dr Rayann Heman for sick sinus syndrome  . Knee surgery      pt denies  . Radioactive seed implant  05/04/2004    8000 cGy, Dr Danny Lawless Dr Reece Agar  . Elbow surgery Bilateral 1973  :  Current Outpatient Prescriptions  Medication Sig Dispense Refill  . albuterol (PROVENTIL,VENTOLIN) 90 MCG/ACT inhaler Inhale 2 puffs into the lungs every 6 (six) hours as needed.  17 g  3  . amLODipine (NORVASC) 10 MG tablet Take 1 tablet (10 mg total) by mouth daily.  100 tablet  3  . aspirin 81 MG tablet Take 81 mg by mouth 2 (two) times daily. ON HOLD      . atorvastatin (LIPITOR) 40 MG tablet Take 1 tablet (40 mg total) by mouth daily.  100 tablet  3  . budesonide-formoterol (SYMBICORT) 160-4.5 MCG/ACT inhaler Inhale 1 puff into the lungs as needed (shortness of breath).  4 Inhaler  3  . cholecalciferol (VITAMIN D) 400 UNITS TABS tablet Take 1,000 Units by mouth daily. HOLD      . clopidogrel (PLAVIX) 75 MG tablet Take 75 mg  by mouth daily. HOLD      . fish oil-omega-3 fatty acids 1000 MG capsule Take 2 g by mouth daily. HOLD      . losartan (COZAAR) 100 MG tablet Take 1 tablet (100 mg total) by mouth daily.  100 tablet  3  . Multiple Vitamin (MULTIVITAMIN) tablet Take 1 tablet by mouth daily. HOLD      . nitroGLYCERIN (NITROSTAT) 0.4 MG SL tablet Place 1 tablet (0.4 mg total) under the tongue every 5 (five) minutes as needed.  25 tablet  1         No Known Allergies:  Family History  Problem Relation Age of Onset  . Asthma Sister   . Asthma Brother   . Coronary artery disease Other   :  History   Social History  . Marital Status: Divorced    Spouse Name: N/A    Number of Children: N/A  . Years of Education: N/A   Occupational History  . Not on file.   Social  History Main Topics  . Smoking status: Former Smoker -- 0.50 packs/day    Quit date: 07/30/1993  . Smokeless tobacco: Not on file  . Alcohol Use: No     Comment: hx alcohol abuse per note dated 04/11/11  . Drug Use: No  . Sexual Activity: Not on file   Other Topics Concern  . Not on file   Social History Narrative  . No narrative on file  :  Constitutional: negative for anorexia, chills and night sweats Eyes: negative for icterus and irritation Ears, nose, mouth, throat, and face: negative for epistaxis and hoarseness Respiratory: negative for cough and hemoptysis Cardiovascular: negative Gastrointestinal: negative Genitourinary:negative for hematuria and urinary incontinence Integument/breast: negative for pruritus and rash Hematologic/lymphatic: negative for bleeding and easy bruising Musculoskeletal:He reports diffuse joint pain mostly in the shoulder and at times in the back.  Neurological: negative for dizziness and headaches Behavioral/Psych: negative for anxiety and behavior problems Endocrine: negative for temperature intolerance Allergic/Immunologic: negative for urticaria  Exam: ECOG 0  General appearance: alert, cooperative and appears stated age Head: Normocephalic, without obvious abnormality, atraumatic Throat: lips, mucosa, and tongue normal; teeth and gums normal Neck: no adenopathy, no carotid bruit, no JVD, supple, symmetrical, trachea midline and thyroid not enlarged, symmetric, no tenderness/mass/nodules Back: symmetric, no curvature. ROM normal. No CVA tenderness. Resp: clear to auscultation bilaterally Chest wall: no tenderness Cardio: regular rate and rhythm, S1, S2 normal, no murmur, click, rub or gallop GI: soft, non-tender; bowel sounds normal; no masses,  no organomegaly Male genitalia: normal Extremities: extremities normal, atraumatic, no cyanosis or edema Pulses: 2+ and symmetric Skin: Skin color, texture, turgor normal. No rashes or  lesions Lymph nodes: Cervical, supraclavicular, and axillary nodes normal. Neurologic: Grossly normal    Assessment and Plan:   78 year old gentleman with prostate cancer. He was initially diagnosed in 2005 as that have presented with a Gleason score 3+4 equals 7 and a PSA of 7.7. After definitive treatment with external beam radiation and brachy therapy, he has recurrent disease that is biopsy proven in December of 2014 with a Gleason score 4+3 equals 7. He has disease in 12 out of 12 cores with a PSA up to 3.83. Treatment options were discussed today after reviewing his pathology with the reviewing pathologists as well as imaging study with radiology. We feel that he has a high-risk disease and at risk of developing metastatic disease. Salvage prostatectomy is unlikely given his age and comorbid conditions and certainly  no radiation treatment is warranted. Local therapy with cryoablation versus systemic therapy are the only options. I had a lengthy discussion today discussing the logistics of hormonal deprivation as well as the complications associated with it that includes loss of bone mineral density, hot flashes, and cardiovascular complications. I've also explained to him the logistics of intermittent androgen deprivation versus continuous androgen depravation if he has complications with. At this time, he is more leaning towards androgen depravation after hearing more about it. I will do further discussion regarding cryoablation to Dr. Luberta Robertson and Dr. Alinda Money today. I do not feel that this modality will offer him much improvement in overall survival at this time.

## 2013-08-06 NOTE — Consult Note (Signed)
Chief Complaint  Biochemically recurrent prostate cancer   Reason For Visit  Reason for consult: To discuss treatment options for biochemically recurrent prostate cancer Physician requesting consult: Dr. Franchot Gallo   History of Present Illness  Nicholas Schmitt is a 78 year old gentleman who was initially diagnosed with clinical stage T1c, Gleason 3+4=7 adenocarcinoma of the prostate in 2005 with a pretreatment PSA of 7.7. He underwent primary treatment with IMRT/radiation seed implantation under the care of Dr. Reece Agar and [name] complete in November 2005. His PSA nadir after therapy with 0.91 in March 2006. His PSA had gradually increased and reached 3.83 in September 2014 which concerned Dr. Diona Fanti (who had assumed his care) for possible recurrent disease. His digital rectal exam was reportedly normal. He underwent a prostate biopsy including biopsy of the seminal vesicles on 06/04/13.  This was a standard 12 core biopsy with 2 additional cores taken of the SVs. All 12 of the prostate core biopsies were positive for Gleason 4+3=7 adenocarcinoma with multiple areas of perineural invasion and extraprostatic extension noted at the left base biopsy. Seminal vesicle biopsies were benign.  Staging studies were performed to evaluate him for metastatic disease.  A bone scan and CT scan of the abdomen and pelvis on 07/02/13 did not demonstrate any evidence to suggest metastatic cancer.   His medical comorbidities include a history of coronary artery disease s/p cardiac stent placement after an MI in the past.  He is managed with Plavix and ASA. He also has a history of hypertension and dyslipidemia.   Past Medical History  1. History of Acute Myocardial Infarction (V12.59)  2. History of Anxiety (300.00)  3. History of Asthma (493.90)  4. History of hypercholesterolemia (V12.29)  5. History of hypertension (V12.59)  6. Personal history of prostate cancer (V10.46)  Surgical History  1.  History of Biopsy Of The Prostate Needle  2. History of Cath Stent Placement  3. History of Elbow Surgery  4. History of Pacemaker Placement  5. History of Umbilical Hernia Repair  Current Meds  1. AmLODIPine Besylate 10 MG Oral Tablet;  Therapy: (Recorded:12Dec2014) to Recorded  2. Aspirin 81 MG Oral Tablet;  Therapy: (Recorded:17Dec2014) to Recorded  3. Atorvastatin Calcium 40 MG Oral Tablet;  Therapy: (Recorded:12Dec2014) to Recorded  4. Clopidogrel Bisulfate 75 MG Oral Tablet;  Therapy: (Recorded:12Dec2014) to Recorded  5. Fish Oil CAPS;  Therapy: (Recorded:12Dec2014) to Recorded  6. Levofloxacin 500 MG Oral Tablet; take one tab by mouth the day before procedure, take  one tab by mouth the day of procedure, take one tab by mouth the after the procedure;  Therapy: [id number] to (Last Rx:12Dec2014)  Requested for: 12Dec2014 Ordered  7. Losartan Potassium 100 MG Oral Tablet;  Therapy: (Recorded:12Dec2014) to Recorded  8. Multi-Day Vitamins TABS;  Therapy: (Recorded:12Dec2014) to Recorded  9. Vitamin D-3 TABS;  Therapy: (Recorded:12Dec2014) to Recorded  Allergies  1. No Known Drug Allergies  Family History  1. Family history of Death In The Family Father  2. Family history of Death In The Family Mother  3. Family history of Death of family member : Mother, Father  56. Family history of Family Health Status Number Of Children  Social History   Denied: History of Alcohol Use   Denied: History of Caffeine Use   Former smoker [place])   History of tobacco use (V15.82)   Marital History - Divorced   Occupation: Retired  Review of Systems AU Complete-Male: Constitutional, skin, eye, otolaryngeal, hematologic/lymphatic, cardiovascular, pulmonary, endocrine, musculoskeletal,  gastrointestinal, neurological and psychiatric system(s) were reviewed and pertinent findings if present are noted.    Physical Exam Constitutional: Well nourished and well developed . No acute  distress.    Results/Data  I have independently reviewed his medical records, PSA results, pathology report, and bone scan/CT scan in the multidisciplinary conference today.  Findings are as dictated above.     Assessment  1. Prostate cancer (185)  Discussion/Summary  1.  Locally advanced adenocarcinoma of the prostate in the setting of biochemical recurrence after radiation therapy: I had a detailed discussion with Nicholas Schmitt today describing his current situation and his options for management.  Considering the volume of cancer present on his recent biopsy and the fact that extraprostatic extension was identified on his biopsy, he understands that there is a high risk for micrometastatic disease and a lower chance of salvage local therapy would be curative.  We also discussed his age, comorbidities, and expected lifespan in the context of his prostate cancer.  Based on the high volume and locally advanced disease, it was recommended that he strongly consider systemic therapy with androgen deprivation therapy in the future.  I had a detailed discussion with him and his friend today regarding appropriate timing of therapy.  While it would not be inappropriate to consider treatment at this time in the setting of biochemically recurrent disease, we discussed our goal of trying to avoid the development of symptomatic disease or death related to prostate cancer.  We discussed utilizing PSA doubling time and potentially periodic imaging studies to determine when he would be at risk of development of symptoms and he understands that the goal would be to institute systemic therapy prior to this time.  He does understand the rationale for this consensus recommendation from the multidisciplinary clinic today.  I will relay this information to Dr. Diona Fanti, his primary urologist, and allow Dr. Diona Fanti to arrange for appropriate follow-up which likely will consist of a repeat PSA in the not too distant  future.  Cc: Dr. Franchot Gallo Dr. Arloa Koh Dr. Zola Button [name]   A total of 32 minutes were spent in the overall care of the patient today with 32 minutes in direct face to face consultation.

## 2013-08-06 NOTE — Progress Notes (Signed)
CC: Dr. Franchot Gallo   Followup note:  Diagnosis: Recurrent carcinoma the prostate  Previous radiation therapy: Status post 4600 cGy in 23 fractions from 03/08/2004 through 04/08/2004 followed by seed implant for boost at 8000 cGy on 05/04/2004. He was treated by doctors Nicholas Schmitt and Nicholas Schmitt.  History: [name] is a pleasant 78 year old gentleman who is seen at the prostate multidisciplinary clinic today for review. He recently presented with stage T1c with PSA of 7.1, and a Gleason score of 7 (3+4). He was treated with external beam radiation therapy followed by seed implantation as mentioned above. His PSA rose to 1.14 in September 2010, and rose to 1 23 by 2012, 2.66 by 2013 and 3.83 by September 2014.  Repeat biopsies by Dr. Diona Schmitt on 06/04/2013 showed extensive high volume 7 (4+3) carcinoma in all 12 biopsies ranging from 40% to 90% core involvement. His prostate volume was 17 cc. He is doing reasonably well from a GU and GI standpoint. His staging workup included a CT scan of the pelvis on 07/02/2013 which was without evidence for metastatic disease. He remains reasonably active and likes to golf.  Physical examination: Alert and oriented. Filed Vitals:   08/06/13 1210  BP: 161/82  Pulse: 81  Temp: 97.4 F (36.3 C)  Resp: 20   Rectal examination not performed today.  Laboratory data: PSA 3.82 from September 2014.  Impression: Recurrent high volume intermediate risk adenocarcinoma prostate. He is not a candidate for further radiation therapy. We feel that he should be offered androgen deprivation therapy at the appropriate time. We discussed the potential acute and late toxicities of androgen deprivation therapy. This will be discussed in more detail by Dr. Alen Schmitt.  15 minutes was spent face-to-face the patient

## 2013-08-07 ENCOUNTER — Telehealth: Payer: Self-pay | Admitting: Internal Medicine

## 2013-08-07 NOTE — Telephone Encounter (Signed)
Discussed with Dr Rayann Heman if Medically necessary may proceed but with known CAD may increase his risk for MI.  He is aware and is going to talk with his cancer MD about moving foward

## 2013-08-07 NOTE — Telephone Encounter (Signed)
New message    Pt has prostate cancer.  They want to do the hormone shot therapy.  Is this safe for his heart?

## 2013-08-15 NOTE — Progress Notes (Addendum)
Met with patient as part of Prostate MDC.  Reintroduced my role as his navigator and encouraged him to call as he proceeds with treatments and appointments at [facility].  Provided the accompanying Care Plan Summary,  encouraged him to call me with any questions or concerns as his treatments progress.  He indicated understanding.  Gayleen Orem, RN, BSN, [facility] Prostate Oncology Navigator [phone]                                         Care Plan Summary  Name: Nicholas Schmitt. Chrissie Noa DOB:   08/05/2035  Your Medical Team:   Urologist -  Dr. Raynelle Bring, [facility]  Radiation Oncologist - Dr. Arloa Koh  Medical Oncologist - Dr. Zola Button  Recommendations: 1) Adrogen Deprivation Therapy (ADT)  * These recommendations are based on information available as of today's consult.      Recommendations may change depending on the results of further tests or exams.  Next Steps: 1) Dr. Alinda Money to discuss with Dr. Diona Fanti 2) Contact Dr. Diona Fanti for follow-up appointment to discuss ADT  When appointments need to be scheduled, you will be contacted by [facility] and/or [facility].  Questions? Please do not hesitate to call Gayleen Orem, RN, BSN, [facility] at [phone] with any questions or concerns.  Liliane Channel is Counsellor and is available to assist you while you're receiving your medical care at [facility].  [name]

## 2013-08-22 ENCOUNTER — Telehealth: Payer: Self-pay | Admitting: *Deleted

## 2013-08-22 NOTE — Telephone Encounter (Signed)
Called patient s/p his attendance at the 08/06/13 Prostate MDC to check on treatment status.  Pt stated that hid "doctor" expressed concerns about the effects of ADT on his heart.  Patient stated that he has a follow-up appt with Dr. Vernie Shanks, [facility], on 09/11/13 to discuss treatment options.  Patient stated that he is planning to attend the 08/26/13 Prostate Cancer support group at [facility].  I encouraged him to call me if I can provide any assistance.  He verbalized understanding.    Gayleen Orem, RN, BSN, [facility] Prostate Oncology Navigator [phone]

## 2013-09-02 ENCOUNTER — Ambulatory Visit (INDEPENDENT_AMBULATORY_CARE_PROVIDER_SITE_OTHER): Payer: Medicare Other | Admitting: *Deleted

## 2013-09-02 ENCOUNTER — Encounter: Payer: Self-pay | Admitting: Internal Medicine

## 2013-09-02 DIAGNOSIS — I499 Cardiac arrhythmia, unspecified: Secondary | ICD-10-CM

## 2013-09-02 DIAGNOSIS — I495 Sick sinus syndrome: Secondary | ICD-10-CM

## 2013-09-02 LAB — MDC_IDC_ENUM_SESS_TYPE_REMOTE
Battery Impedance: 100 Ohm
Battery Voltage: 2.79 V
Brady Statistic AP VP Percent: 5.5 %
Brady Statistic AP VS Percent: 78.1 %
Brady Statistic AS VP Percent: 2.5 %
Lead Channel Impedance Value: 524 Ohm
Lead Channel Pacing Threshold Amplitude: 0.625 V
Lead Channel Sensing Intrinsic Amplitude: 11.2 mV
Lead Channel Setting Pacing Amplitude: 2 V
Lead Channel Setting Pacing Amplitude: 2.5 V
Lead Channel Setting Pacing Pulse Width: 0.4 ms
Lead Channel Setting Sensing Sensitivity: 5.6 mV
MDC IDC MSMT LEADCHNL RA PACING THRESHOLD PULSEWIDTH: 0.4 ms
MDC IDC MSMT LEADCHNL RV IMPEDANCE VALUE: 695 Ohm
MDC IDC MSMT LEADCHNL RV PACING THRESHOLD AMPLITUDE: 0.5 V
MDC IDC MSMT LEADCHNL RV PACING THRESHOLD PULSEWIDTH: 0.4 ms
MDC IDC STAT BRADY AS VS PERCENT: 13.9 %

## 2013-09-20 ENCOUNTER — Encounter: Payer: Self-pay | Admitting: *Deleted

## 2013-10-01 ENCOUNTER — Encounter: Payer: Self-pay | Admitting: Family Medicine

## 2013-10-01 ENCOUNTER — Ambulatory Visit (INDEPENDENT_AMBULATORY_CARE_PROVIDER_SITE_OTHER): Payer: Medicare Other | Admitting: Family Medicine

## 2013-10-01 ENCOUNTER — Ambulatory Visit: Payer: Self-pay | Admitting: Family Medicine

## 2013-10-01 VITALS — BP 160/70 | Temp 97.9°F | Wt 221.5 lb

## 2013-10-01 DIAGNOSIS — C44211 Basal cell carcinoma of skin of unspecified ear and external auricular canal: Secondary | ICD-10-CM

## 2013-10-01 DIAGNOSIS — C44219 Basal cell carcinoma of skin of left ear and external auricular canal: Secondary | ICD-10-CM

## 2013-10-01 NOTE — Progress Notes (Signed)
Pre visit review using our clinic review tool, if applicable. No additional management support is needed unless otherwise documented below in the visit note. 

## 2013-10-01 NOTE — Patient Instructions (Signed)
Return when necessary

## 2013-10-01 NOTE — Progress Notes (Signed)
   Subjective:    Patient ID: Nicholas Schmitt, male    DOB: Jul 12, 1935, 78 y.o.   MRN: [id number]  HPI  Nicholas Schmitt is a 78 year old male who comes in today for followup of a basal cell carcinoma of his left ear lobe  Removed the lesion about 8 weeks ago. It's healed well no complications and no recurrence  Review of Systems Negative well-developed well-nourished male no acute distress vital signs stable he is afebrile    Objective:   Physical Exam  Well-developed well-nourished male no acute distress vital signs stable he is afebrile examination here shows that healing no evidence of recurrence      Assessment & Plan:  Basal cell carcinoma the left ear resolved with removal

## 2013-12-05 ENCOUNTER — Encounter: Payer: Self-pay | Admitting: Internal Medicine

## 2013-12-05 ENCOUNTER — Ambulatory Visit (INDEPENDENT_AMBULATORY_CARE_PROVIDER_SITE_OTHER): Payer: Medicare Other | Admitting: *Deleted

## 2013-12-05 DIAGNOSIS — I495 Sick sinus syndrome: Secondary | ICD-10-CM

## 2013-12-05 LAB — MDC_IDC_ENUM_SESS_TYPE_REMOTE
Battery Impedance: 100 Ohm
Battery Voltage: 2.8 V
Brady Statistic AS VP Percent: 1 %
Brady Statistic AS VS Percent: 17 %
Lead Channel Impedance Value: 678 Ohm
Lead Channel Pacing Threshold Pulse Width: 0.4 ms
Lead Channel Setting Pacing Amplitude: 2 V
Lead Channel Setting Pacing Amplitude: 2.5 V
Lead Channel Setting Pacing Pulse Width: 0.4 ms
Lead Channel Setting Sensing Sensitivity: 5.6 mV
MDC IDC MSMT BATTERY REMAINING LONGEVITY: 147 mo
MDC IDC MSMT LEADCHNL RA IMPEDANCE VALUE: 532 Ohm
MDC IDC MSMT LEADCHNL RA PACING THRESHOLD AMPLITUDE: 0.75 V
MDC IDC MSMT LEADCHNL RA PACING THRESHOLD PULSEWIDTH: 0.4 ms
MDC IDC MSMT LEADCHNL RV PACING THRESHOLD AMPLITUDE: 0.5 V
MDC IDC MSMT LEADCHNL RV SENSING INTR AMPL: 22.4 mV
MDC IDC SESS DTM: 20150625080955
MDC IDC STAT BRADY AP VP PERCENT: 3 %
MDC IDC STAT BRADY AP VS PERCENT: 79 %

## 2013-12-05 NOTE — Progress Notes (Signed)
Remote pacemaker transmission.   

## 2013-12-11 ENCOUNTER — Encounter: Payer: Self-pay | Admitting: Cardiology

## 2014-01-07 ENCOUNTER — Ambulatory Visit (INDEPENDENT_AMBULATORY_CARE_PROVIDER_SITE_OTHER)
Admission: RE | Admit: 2014-01-07 | Discharge: 2014-01-07 | Disposition: A | Discharge status: Home / Self Care | Payer: Medicare Other | Source: Ambulatory Visit | Attending: Family Medicine | Admitting: Family Medicine

## 2014-01-07 ENCOUNTER — Encounter: Payer: Self-pay | Admitting: Family Medicine

## 2014-01-07 ENCOUNTER — Ambulatory Visit (INDEPENDENT_AMBULATORY_CARE_PROVIDER_SITE_OTHER): Payer: Medicare Other | Admitting: Family Medicine

## 2014-01-07 VITALS — BP 160/74 | HR 73 | Temp 97.9°F | Wt 212.0 lb

## 2014-01-07 DIAGNOSIS — R042 Hemoptysis: Secondary | ICD-10-CM

## 2014-01-07 LAB — CBC WITH DIFFERENTIAL/PLATELET
BASOS ABS: 0 10*3/uL (ref 0.0–0.1)
Basophils Relative: 0.2 % (ref 0.0–3.0)
EOS PCT: 3.8 % (ref 0.0–5.0)
Eosinophils Absolute: 0.2 10*3/uL (ref 0.0–0.7)
HEMATOCRIT: 36.5 % — AB (ref 39.0–52.0)
Hemoglobin: 12.2 g/dL — ABNORMAL LOW (ref 13.0–17.0)
LYMPHS ABS: 1.6 10*3/uL (ref 0.7–4.0)
Lymphocytes Relative: 28 % (ref 12.0–46.0)
MCHC: 33.3 g/dL (ref 30.0–36.0)
MCV: 88.5 fl (ref 78.0–100.0)
MONO ABS: 0.6 10*3/uL (ref 0.1–1.0)
MONOS PCT: 11 % (ref 3.0–12.0)
NEUTROS PCT: 57 % (ref 43.0–77.0)
Neutro Abs: 3.3 10*3/uL (ref 1.4–7.7)
PLATELETS: 220 10*3/uL (ref 150.0–400.0)
RBC: 4.12 Mil/uL — ABNORMAL LOW (ref 4.22–5.81)
RDW: 15.5 % (ref 11.5–15.5)
WBC: 5.8 10*3/uL (ref 4.0–10.5)

## 2014-01-07 MED ORDER — AZITHROMYCIN 250 MG PO TABS: ORAL_TABLET | ORAL | Status: AC

## 2014-01-07 NOTE — Progress Notes (Signed)
Pre visit review using our clinic review tool, if applicable. No additional management support is needed unless otherwise documented below in the visit note. 

## 2014-01-07 NOTE — Progress Notes (Signed)
   Subjective:    Patient ID: Nicholas Schmitt, male    DOB: [birth year], 78 y.o.   MRN: [id number]  Back Pain Pertinent negatives include no chest pain.  Cough Pertinent negatives include no chest pain.   Patient is seen with chief complaint of hemoptysis. Onset about 2-3 weeks ago. His symptoms are inconsistent. He denies any other bleeding complications. No sinus congestion. He does take Plavix and aspirin. Ex-smoker- quit back in 1995 after about 35 pack year history. He has some mild dyspnea which is somewhat chronic. Possibly some wheezing off and on. No fevers or chills. He also complains of some vague back pain between his scapular area but denies any pleuritic pain.  He has had some modest weight loss of 9 pounds since last April. No appetite changes.  Past Medical History  Diagnosis Date  . Hypertension   . CAD (coronary artery disease)     positive stress test in 2006 led to left heart cath (8/06) showing 95% prox RCA, 90% CFX, and 90% mLAD.  patient had a cypher DES to all 3 lesions  . Hyperlipidemia   . Hearing loss   . Cancer     prostate Tx with prostatectomy  . AAA (abdominal aortic aneurysm) 11/09    3.2 cm   . Asthma   . Prostate cancer 2005    gleason 7  . Chronic renal insufficiency   . History of radiation therapy 03/08/04- 04/08/04    prostate 4600 cGy in 3 fractions, radioactive seed implant 05/04/04  . Myocardial infarction 1995  . Skin cancer     ear   Past Surgical History  Procedure Laterality Date  . Prostatectomy  2005  . Ptca  2006  . Coronary stent placement    . Permanent pacemaker insertion Bilateral 02/26/13    MDT Adapta L implanted by Dr Rayann Heman for sick sinus syndrome  . Knee surgery      pt denies  . Radioactive seed implant  05/04/2004    8000 cGy, Dr Danny Lawless Dr Reece Agar  . Elbow surgery Bilateral 1973    reports that he quit smoking about 20 years ago. He does not have any smokeless tobacco history on file. He reports that he does  not drink alcohol or use illicit drugs. family history includes Asthma in his brother and sister; Coronary artery disease in his other. No Known Allergies    Review of Systems  Constitutional: Positive for fatigue and unexpected weight change. Negative for appetite change.  HENT: Negative for congestion.   Respiratory: Positive for cough.   Cardiovascular: Negative for chest pain.  Musculoskeletal: Positive for back pain.  Hematological: Does not bruise/bleed easily.       Objective:   Physical Exam  Constitutional: He appears well-developed and well-nourished.  HENT:  Mouth/Throat: Oropharynx is clear and moist.  Neck: Neck supple. No thyromegaly present.  Cardiovascular: Normal rate and regular rhythm.   Pulmonary/Chest: Effort normal and breath sounds normal. No respiratory distress. He has no wheezes. He has no rales.  Musculoskeletal: He exhibits no edema.           Assessment & Plan:  Patient seen with 2 week history of hemoptysis. He does not have any tachycardia, hypoxemia, pleuritic pain to suggest likely pulmonary embolus.  Check labs with CBC and d-dimer. Obtain chest x-ray. If all the above normal, treat with Zithromax. Followup immediately for increased shortness of breath or fever.

## 2014-01-07 NOTE — Patient Instructions (Signed)
Follow up for any fever or increased shortness of breath. 

## 2014-01-08 ENCOUNTER — Other Ambulatory Visit: Payer: Self-pay | Admitting: Family Medicine

## 2014-01-08 ENCOUNTER — Telehealth: Payer: Self-pay | Admitting: Family Medicine

## 2014-01-08 DIAGNOSIS — R042 Hemoptysis: Secondary | ICD-10-CM

## 2014-01-08 DIAGNOSIS — R7989 Other specified abnormal findings of blood chemistry: Secondary | ICD-10-CM

## 2014-01-08 LAB — D-DIMER, QUANTITATIVE (NOT AT ARMC): D-Dimer, Quant: 0.69 ug/mL-FEU — ABNORMAL HIGH (ref 0.00–0.48)

## 2014-01-08 NOTE — Telephone Encounter (Signed)
Pt scheduled for 01-09-2014@3 :00 CT ANGIO CHEST PE W/CM &/OR WO CM Pt need a bun and creatine  Also called pt to inform of his appt .please call pt to let him know when to come in to have labs drawn [phone]

## 2014-01-08 NOTE — Telephone Encounter (Signed)
Pt had X-rays done on 01-07-2014 . Pt would like to know what the report showed pt  can be reached at [phone] home  Or cell [phone] patient saw Dr Elease Hashimoto on 01-07-2014 as well , Dr Sherren Mocha was out of the office .

## 2014-01-08 NOTE — Telephone Encounter (Signed)
Pt informed

## 2014-01-08 NOTE — Telephone Encounter (Signed)
Pt informed and lab is set up

## 2014-01-09 ENCOUNTER — Ambulatory Visit (INDEPENDENT_AMBULATORY_CARE_PROVIDER_SITE_OTHER)
Admission: RE | Admit: 2014-01-09 | Discharge: 2014-01-09 | Disposition: A | Discharge status: Home / Self Care | Payer: Medicare Other | Source: Ambulatory Visit | Attending: Family Medicine | Admitting: Family Medicine

## 2014-01-09 ENCOUNTER — Other Ambulatory Visit (INDEPENDENT_AMBULATORY_CARE_PROVIDER_SITE_OTHER): Payer: Medicare Other

## 2014-01-09 ENCOUNTER — Telehealth: Payer: Self-pay | Admitting: Family Medicine

## 2014-01-09 DIAGNOSIS — R042 Hemoptysis: Secondary | ICD-10-CM

## 2014-01-09 DIAGNOSIS — R791 Abnormal coagulation profile: Secondary | ICD-10-CM

## 2014-01-09 DIAGNOSIS — R7989 Other specified abnormal findings of blood chemistry: Secondary | ICD-10-CM

## 2014-01-09 LAB — BASIC METABOLIC PANEL
BUN: 23 mg/dL (ref 6–23)
CALCIUM: 9.4 mg/dL (ref 8.4–10.5)
CO2: 27 mEq/L (ref 19–32)
CREATININE: 1.2 mg/dL (ref 0.4–1.5)
Chloride: 108 mEq/L (ref 96–112)
GFR: 63.83 mL/min (ref >60.00)
Glucose, Bld: 119 mg/dL — ABNORMAL HIGH (ref 70–99)
Potassium: 4.6 mEq/L (ref 3.5–5.1)
SODIUM: 140 meq/L (ref 135–145)

## 2014-01-09 MED ORDER — IOHEXOL 350 MG/ML SOLN
100.0000 mL | Freq: Once | INTRAVENOUS | Status: AC | PRN
  Administered 2014-01-09: 100 mL via INTRAVENOUS

## 2014-01-09 NOTE — Telephone Encounter (Signed)
Pt came in and would like to know if he needs to stay on the following meds aspirin vitamin D fish oil andf multivitamin

## 2014-01-10 ENCOUNTER — Ambulatory Visit (INDEPENDENT_AMBULATORY_CARE_PROVIDER_SITE_OTHER): Payer: Medicare Other | Admitting: Family Medicine

## 2014-01-10 ENCOUNTER — Encounter: Payer: Self-pay | Admitting: Family Medicine

## 2014-01-10 VITALS — BP 140/78 | HR 82 | Temp 98.2°F | Resp 20 | Ht 71.0 in | Wt 211.0 lb

## 2014-01-10 DIAGNOSIS — R042 Hemoptysis: Secondary | ICD-10-CM

## 2014-01-10 DIAGNOSIS — R222 Localized swelling, mass and lump, trunk: Secondary | ICD-10-CM

## 2014-01-10 DIAGNOSIS — R918 Other nonspecific abnormal finding of lung field: Secondary | ICD-10-CM

## 2014-01-10 NOTE — Progress Notes (Signed)
Subjective:    Patient ID: Nicholas Schmitt, male    DOB: April 21, 1936, 78 y.o.   MRN: [id number]  HPI Patient seen for followup recent episodes of hemoptysis. Refer to prior note:  "Patient is seen with chief complaint of hemoptysis. Onset about 2-3 weeks ago. His symptoms are inconsistent. He denies any other bleeding complications. No sinus congestion. He does take Plavix and aspirin. Ex-smoker- quit back in 1995 after about 35 pack year history. He has some mild dyspnea which is somewhat chronic. Possibly some wheezing off and on. No fevers or chills. He also complains of some vague back pain between his scapular area but denies any pleuritic pain.  He has had some modest weight loss of 9 pounds since last April. No appetite changes"  We obtained d-dimer which was slightly elevated. Chest x-ray revealed no acute findings. CT angiogram did not reveal any pulmonary embolus. However, he did have an irregular nodular density right upper lobe posterior segment suspicious for neoplasm. Also mention of adenopathy at multiple sites. There was mention of new small right adrenal mass. Recommendation for consideration for PET scan.  Patient has not had any further hemoptysis. No fever. No chills. He has some chronic dyspnea.  Past Medical History  Diagnosis Date  . Hypertension   . CAD (coronary artery disease)     positive stress test in 2006 led to left heart cath (8/06) showing 95% prox RCA, 90% CFX, and 90% mLAD.  patient had a cypher DES to all 3 lesions  . Hyperlipidemia   . Hearing loss   . Cancer     prostate Tx with prostatectomy  . AAA (abdominal aortic aneurysm) 11/09    3.2 cm   . Asthma   . Prostate cancer 2005    gleason 7  . Chronic renal insufficiency   . History of radiation therapy 03/08/04- 04/08/04    prostate 4600 cGy in 3 fractions, radioactive seed implant 05/04/04  . Myocardial infarction 1995  . Skin cancer     ear   Past Surgical History  Procedure Laterality  Date  . Prostatectomy  2005  . Ptca  2006  . Coronary stent placement    . Permanent pacemaker insertion Bilateral 02/26/13    MDT Adapta L implanted by Dr Rayann Heman for sick sinus syndrome  . Knee surgery      pt denies  . Radioactive seed implant  05/04/2004    8000 cGy, Dr Danny Lawless Dr Reece Agar  . Elbow surgery Bilateral 1973    reports that he quit smoking about 20 years ago. He does not have any smokeless tobacco history on file. He reports that he does not drink alcohol or use illicit drugs. family history includes Asthma in his brother and sister; Coronary artery disease in his other. No Known Allergies     Review of Systems  Constitutional: Negative for fever and chills.  Respiratory: Positive for cough and shortness of breath. Negative for wheezing.   Cardiovascular: Negative for chest pain, palpitations and leg swelling.  Gastrointestinal: Negative for abdominal pain.       Objective:   Physical Exam  Constitutional: He appears well-developed and well-nourished. No distress.  Neck: Neck supple. No thyromegaly present.  Cardiovascular: Normal rate and regular rhythm.   Pulmonary/Chest: Effort normal. He has no rales.  Somewhat diminished breath sounds throughout. No rales  Musculoskeletal: He exhibits no edema.          Assessment & Plan:  Patient presented with recent hemoptysis.  CT angiogram negative for PE. Patient has concerning nodular lesion right upper lobe with adenopathy. Set up PET scan. Consider referral CVTS. Pt is currently symptomatically stable.  No fever or other indicators of pneumonia.

## 2014-01-10 NOTE — Progress Notes (Signed)
Pre visit review using our clinic review tool, if applicable. No additional management support is needed unless otherwise documented below in the visit note. 

## 2014-01-10 NOTE — Patient Instructions (Signed)
We will call you for additional tests and referrals Start antibiotic IF you have any fever or productive cough.

## 2014-01-13 ENCOUNTER — Other Ambulatory Visit: Payer: Self-pay | Admitting: Family Medicine

## 2014-01-13 ENCOUNTER — Encounter: Payer: Self-pay | Admitting: *Deleted

## 2014-01-13 DIAGNOSIS — R918 Other nonspecific abnormal finding of lung field: Secondary | ICD-10-CM

## 2014-01-13 NOTE — Telephone Encounter (Signed)
Patient was seen by Dr Elease Hashimoto

## 2014-01-17 ENCOUNTER — Encounter (HOSPITAL_COMMUNITY): Payer: Self-pay

## 2014-01-17 ENCOUNTER — Encounter (HOSPITAL_COMMUNITY)
Admission: RE | Admit: 2014-01-17 | Discharge: 2014-01-17 | Disposition: A | Discharge status: Home / Self Care | Payer: Medicare Other | Source: Ambulatory Visit | Attending: Family Medicine | Admitting: Family Medicine

## 2014-01-17 DIAGNOSIS — R918 Other nonspecific abnormal finding of lung field: Secondary | ICD-10-CM

## 2014-01-17 DIAGNOSIS — R222 Localized swelling, mass and lump, trunk: Secondary | ICD-10-CM | POA: Insufficient documentation

## 2014-01-17 LAB — GLUCOSE, CAPILLARY: GLUCOSE-CAPILLARY: 112 mg/dL — AB (ref 70–99)

## 2014-01-17 MED ORDER — FLUDEOXYGLUCOSE F - 18 (FDG) INJECTION: 13.0000 | Freq: Once | INTRAVENOUS | Status: AC | PRN

## 2014-01-21 ENCOUNTER — Institutional Professional Consult (permissible substitution) (INDEPENDENT_AMBULATORY_CARE_PROVIDER_SITE_OTHER): Payer: Medicare Other | Admitting: Thoracic Surgery (Cardiothoracic Vascular Surgery)

## 2014-01-21 ENCOUNTER — Encounter: Payer: Self-pay | Admitting: Thoracic Surgery (Cardiothoracic Vascular Surgery)

## 2014-01-21 VITALS — BP 118/74 | HR 71 | Ht 71.0 in | Wt 211.0 lb

## 2014-01-21 DIAGNOSIS — R911 Solitary pulmonary nodule: Secondary | ICD-10-CM | POA: Insufficient documentation

## 2014-01-21 DIAGNOSIS — R918 Other nonspecific abnormal finding of lung field: Secondary | ICD-10-CM

## 2014-01-21 DIAGNOSIS — R590 Localized enlarged lymph nodes: Secondary | ICD-10-CM

## 2014-01-21 DIAGNOSIS — R222 Localized swelling, mass and lump, trunk: Secondary | ICD-10-CM

## 2014-01-21 DIAGNOSIS — R599 Enlarged lymph nodes, unspecified: Secondary | ICD-10-CM

## 2014-01-21 NOTE — Progress Notes (Signed)
PCP is [name],[name] [name], MD Referring Provider is Eulas Post, MD  Chief Complaint  Patient presents with  . NEW THORACIC       UPPER LOBE MASS CTA CHEST 01/10/14, PET Scan 01/17/14    HPI: 78 yo man sent for consultation regarding a right lung nodule with mediastinal adenopathy.  Mr. Weide is a 78 year old gentleman with a history of coronary disease and prostate cancer who presented Dr. Elease Hashimoto with a complaint of right-sided back pain under the shoulder blade and shortness of breath. He describes his pain as a very severe aching pain. It sometimes seems to be related to movement. He also experiences a duller pain when he lies down at night. He has had a cough and wheezing. He had one episode of hemoptysis about a week ago, but has not had any since then.   He says that he used to be able to walk about a mile without getting short of breath now he is down to a half mile to three quarters of a mile. He's lost 8 pounds over the past 3 months. He says that he said decreased energy and a poor appetite.  He has a history of coronary disease with an MI in 1995. He had 2 stents placed in 2006. He has been on Plavix since then. He has had his Plavix stopped on several occasions for procedures and has not had any complications. He had a pacemaker placed in 2014. He has not had any recent chest pain or tightness reminiscent of his coronary disease.    Past Medical History  Diagnosis Date  . Hypertension   . CAD (coronary artery disease)     positive stress test in 2006 led to left heart cath (8/06) showing 95% prox RCA, 90% CFX, and 90% mLAD.  patient had a cypher DES to all 3 lesions  . Hyperlipidemia   . Hearing loss   . Cancer     prostate Tx with prostatectomy  . AAA (abdominal aortic aneurysm) 11/09    3.2 cm   . Asthma   . Prostate cancer 2005    gleason 7  . Chronic renal insufficiency   . History of radiation therapy 03/08/04- 04/08/04    prostate 4600 cGy in 3 fractions,  radioactive seed implant 05/04/04  . Myocardial infarction 1995  . Skin cancer     ear    Past Surgical History  Procedure Laterality Date  . Prostatectomy  2005  . Ptca  2006  . Coronary stent placement    . Permanent pacemaker insertion Bilateral 02/26/13    MDT Adapta L implanted by Dr Rayann Heman for sick sinus syndrome  . Knee surgery      pt denies  . Radioactive seed implant  05/04/2004    8000 cGy, Dr Danny Lawless Dr Reece Agar  . Elbow surgery Bilateral 1973    Family History  Problem Relation Age of Onset  . Asthma Sister   . Asthma Brother   . Coronary artery disease Other     Social History History  Substance Use Topics  . Smoking status: Former Smoker -- 0.50 packs/day    Quit date: 07/30/1993  . Smokeless tobacco: Not on file  . Alcohol Use: No     Comment: hx alcohol abuse per note dated 04/11/11    Current Outpatient Prescriptions  Medication Sig Dispense Refill  . albuterol (PROVENTIL,VENTOLIN) 90 MCG/ACT inhaler Inhale 2 puffs into the lungs every 6 (six) hours as needed.  17 g  3  .  amLODipine (NORVASC) 10 MG tablet Take 1 tablet (10 mg total) by mouth daily.  100 tablet  3  . atorvastatin (LIPITOR) 40 MG tablet Take 1 tablet (40 mg total) by mouth daily.  100 tablet  3  . budesonide-formoterol (SYMBICORT) 160-4.5 MCG/ACT inhaler Inhale 1 puff into the lungs as needed (shortness of breath).  4 Inhaler  3  . clopidogrel (PLAVIX) 75 MG tablet Take 75 mg by mouth daily. HOLD      . losartan (COZAAR) 100 MG tablet Take 1 tablet (100 mg total) by mouth daily.  100 tablet  3  . Multiple Vitamin (MULTIVITAMIN) tablet Take 1 tablet by mouth daily. HOLD      . nitroGLYCERIN (NITROSTAT) 0.4 MG SL tablet Place 1 tablet (0.4 mg total) under the tongue every 5 (five) minutes as needed.  25 tablet  1   No current facility-administered medications for this visit.    No Known Allergies  Review of Systems  Constitutional: Positive for fatigue and unexpected weight  change (lost 8 pounds in 3 months).  HENT: Positive for hearing loss.   Respiratory: Positive for cough (1 episode of hemoptysis), shortness of breath (walking 1/2- 3/4 mile) and wheezing.   Cardiovascular: Negative for chest pain.  Neurological: Negative for dizziness and weakness.  Hematological: Bruises/bleeds easily (on plavix).  All other systems reviewed and are negative.   BP 118/74  Pulse 71  Ht 5\' 11"  (1.803 m)  Wt 211 lb (95.709 kg)  BMI 29.44 kg/m2  SpO2 98% Physical Exam  Vitals reviewed. Constitutional: He is oriented to person, place, and time. He appears well-developed and well-nourished. No distress.  HENT:  Head: Normocephalic and atraumatic.  Eyes: EOM are normal. Pupils are equal, round, and reactive to light.  Neck: Neck supple. No thyromegaly present.  Cardiovascular: Normal rate and regular rhythm.   Murmur (2/6 systolic) heard. Pulmonary/Chest: Effort normal and breath sounds normal. He has no wheezes. He has no rales.  Abdominal: Soft. There is no tenderness.  Musculoskeletal: He exhibits no edema.  Lymphadenopathy:    He has no cervical adenopathy.  Neurological: He is alert and oriented to person, place, and time. No cranial nerve deficit.  Skin: Skin is warm and dry.     Diagnostic Tests: CT ANGIOGRAPHY CHEST WITH CONTRAST  TECHNIQUE:  Multidetector CT imaging of the chest was performed using the  standard protocol during bolus administration of intravenous  contrast. Multiplanar CT image reconstructions and MIPs were  obtained to evaluate the vascular anatomy.  CONTRAST: 116mL OMNIPAQUE IOHEXOL 350 MG/ML SOLN  COMPARISON: Chest CT July 22, 2010 and chest radiograph January 07, 2014  FINDINGS:  There is no demonstrable pulmonary embolus. There is atherosclerotic  change in the aorta but no aneurysm or dissection appreciable.  There is underlying centrilobular emphysema. There is a somewhat  irregular mass in the posterior segment of the  right upper lobe  measuring 1.7 x 1.2 cm, best seen on slice 12 series 6. There is a  smaller mass in the posterior segment of the right upper lobe near  the apex measuring 5 x 4 mm seen on slice 9 series 6. There is  patchy infiltrate in medial inferior aspect of the right middle  lobe. 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  There is an enlarged anterior mediastinal lymph node measuring 2.5  by 2.3 cm. There is adenopathy in the right mediastinum just  anterior to the carina measuring 2.7 x 2.4 cm. There is right hilar  adenopathy measuring 3.0 by 2.3 cm. There is a mildly prominent sub-  carinal lymph node measuring 2.2 x 1.3 cm.  There is a small hiatal hernia with thickening of the esophagus  throughout much of its course.  The pericardium is not thickened. There are multiple foci of  coronary artery calcification.  There are small cysts in the liver, stable from prior study.  Incomplete visualization of upper pole left renal cysts is again  noted. There is a mass in the right adrenal gland which was not  present previously measuring 2.1 x 1.3 cm.  There are no blastic or lytic bone lesions apparent. Pacemaker leads  are attached to the right atrium and right ventricle. Thyroid  appears unremarkable.  Review of the MIP images confirms the above findings.  IMPRESSION:  No demonstrable pulmonary embolus.  Irregular nodular lesion in the right upper lobe posteriorly  suspicious for neoplasm. There is adenopathy at multiple sites.  There is also a new small right adrenal mass. Neoplastic etiology  for these findings is certainly suspected. These findings may  warrant correlation with nuclear medicine PET study to further  assess.  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 There is underlying emphysematous change.  Small hiatal hernia. Somewhat  diffuse esophageal thickening raising  concern for chronic reflux esophagitis.  These results will be called to the ordering clinician or  representative by the Radiologist Assistant, and communication  documented in the PACS or zVision Dashboard.  Electronically Signed  By: Lowella Grip M.D.  On: 01/09/2014 15:22  NUCLEAR MEDICINE PET SKULL BASE TO THIGH  TECHNIQUE:  13.0 mCi F-18 FDG was injected intravenously. Full-ring PET imaging  was performed from the skull base to thigh after the radiotracer. CT  data was obtained and used for attenuation correction and anatomic  localization.  FASTING BLOOD GLUCOSE: Value: 112 mg/dl  COMPARISON: CT chest dated 01/09/2014. Bone scan dated 07/02/2013.  CT abdomen pelvis dated 07/03/2011.  FINDINGS:  NECK  6 mm short axis high right supraclavicular node (series 4/ image  45), max SUV 4.3.  CHEST  14 x 12 mm spiculated posterior right upper lobe nodule (series  8/image 17), corresponding to the CT abnormality, max SUV 1.6. While  technically indeterminate, given the associated findings, this  appearance is worrisome for primary bronchogenic neoplasm.  Evaluation of lung parenchyma is constrained by respiratory motion.  Underlying emphysematous changes. No pleural effusion or  pneumothorax.  Widespread thoracic lymphadenopathy, including:  --10 mm short axis right supraclavicular/level 7 node series 4/  image 50), max SUV 14.1  --8 mm short axis node at the left thoracic inlet (series 4/ image  49), max SUV 11.8  --8 mm short axis high right paratracheal node (series 4/image 58),  max SUV 13.4  --1.7 cm short axis right prevascular node (series 4/ image 66), max  SUV 13.9  --1.8 cm short axis right paratracheal node (series 4/image 56), max  SUV 13.4  --1.8 cm short axis right hilar node (series 4/ image 71), max SUV  15.8  The heart is normal in size. Left subclavian pacemaker. Coronary  atherosclerosis.  ABDOMEN/PELVIS  1.6 x  1.1 cm right adrenal nodule, max SUV 8.7, suspicious for  metastasis.  No abnormal hypermetabolic activity within the liver, pancreas, or  spleen.  No hypermetabolic lymph nodes in the abdomen or pelvis.  Brachytherapy seeds in the prostate.  Small fat containing  left inguinal hernia. Vascular calcifications.  3.1 x 3.4 cm infrarenal abdominal aortic aneurysm.  SKELETON  18 mm lytic lesion in the right L5 vertebral body, max SUV 10.5.  Additional lytic lesion in the right sacrum (series 4/ image 164),  max SUV 8.8.  IMPRESSION:  Mildly hypermetabolic spiculated nodule in the posterior right upper  lobe, suspicious for primary bronchogenic neoplasm.  Extensive thoracic lymphadenopathy, including a hypermetabolic right  supraclavicular node. Consider percutaneous biopsy of this node for  tissue confirmation.  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  Osseous metastases involving the right L5 vertebral body and right  sacrum.  Electronically Signed:  By: Julian Hy M.D.  On: 01/17/2014 09:55    Impression: 78 year old gentleman with a new right upper lobe lung nodule with associated hilar, mediastinal, and supraclavicular adenopathy. PET/CT shows these areas to be hypermetabolic. He also has a hypermetabolic right adrenal lesion and a lytic lesion in the L5 vertebral body and right sacrum. These findings are most consistent with stage IV lung cancer. We do need to do a biopsy to get a tissue diagnosis to confirm that suspicion.  The easiest approach would be to do a supraclavicular node biopsy, but I am not able to palpate the supraclavicular node on exam.  I think the most definitive procedure would be to do a mediastinoscopy. This would allow [place] to obtain a diagnosis as well as obtain enough tissue for molecular testing.  I discussed the proposed procedure of mediastinoscopy with Mr. Fosco. We discussed the indications, risks, benefits, and alternatives. It would be done in the  operating room under general anesthesia. I described the incision to him. We'll plan to do this on an outpatient procedure. He understands the risks include, but are not limited to death, MI, stroke, DVT, bleeding, possible need for transfusion, infection, pneumothorax, as well as the possibility of unforeseeable complications.  He accepts the risks and agrees to proceed.  We will plan to hold his Plavix after his dose tomorrow in preparation for surgery on 01/28/2014  Plan: Mediastinoscopy on Tuesday, 01/28/2014

## 2014-01-22 ENCOUNTER — Other Ambulatory Visit: Payer: Self-pay

## 2014-01-22 DIAGNOSIS — R918 Other nonspecific abnormal finding of lung field: Secondary | ICD-10-CM

## 2014-01-22 DIAGNOSIS — R590 Localized enlarged lymph nodes: Secondary | ICD-10-CM

## 2014-01-23 ENCOUNTER — Encounter (HOSPITAL_COMMUNITY): Payer: Self-pay | Admitting: Pharmacy Technician

## 2014-01-24 ENCOUNTER — Encounter (HOSPITAL_COMMUNITY)
Admission: RE | Admit: 2014-01-24 | Discharge: 2014-01-24 | Disposition: A | Discharge status: Home / Self Care | Payer: Medicare Other | Source: Ambulatory Visit | Attending: Thoracic Surgery (Cardiothoracic Vascular Surgery) | Admitting: Thoracic Surgery (Cardiothoracic Vascular Surgery)

## 2014-01-24 ENCOUNTER — Encounter (HOSPITAL_COMMUNITY): Payer: Self-pay

## 2014-01-24 VITALS — BP 151/79 | HR 78 | Temp 98.1°F | Resp 20 | Ht 72.0 in | Wt 210.3 lb

## 2014-01-24 DIAGNOSIS — I4510 Unspecified right bundle-branch block: Secondary | ICD-10-CM | POA: Diagnosis not present

## 2014-01-24 DIAGNOSIS — Z01810 Encounter for preprocedural cardiovascular examination: Secondary | ICD-10-CM | POA: Insufficient documentation

## 2014-01-24 DIAGNOSIS — R590 Localized enlarged lymph nodes: Secondary | ICD-10-CM

## 2014-01-24 DIAGNOSIS — Z01818 Encounter for other preprocedural examination: Secondary | ICD-10-CM | POA: Insufficient documentation

## 2014-01-24 DIAGNOSIS — Z01812 Encounter for preprocedural laboratory examination: Secondary | ICD-10-CM | POA: Diagnosis present

## 2014-01-24 DIAGNOSIS — Z950 Presence of cardiac pacemaker: Secondary | ICD-10-CM | POA: Insufficient documentation

## 2014-01-24 DIAGNOSIS — R918 Other nonspecific abnormal finding of lung field: Secondary | ICD-10-CM

## 2014-01-24 HISTORY — DX: Chronic obstructive pulmonary disease, unspecified: J44.9

## 2014-01-24 HISTORY — DX: Presence of cardiac pacemaker: Z95.0

## 2014-01-24 HISTORY — DX: Shortness of breath: R06.02

## 2014-01-24 LAB — COMPREHENSIVE METABOLIC PANEL
ALBUMIN: 3.4 g/dL — AB (ref 3.5–5.2)
ALT: 17 U/L (ref 0–53)
ANION GAP: 15 (ref 5–15)
AST: 17 U/L (ref 0–37)
Alkaline Phosphatase: 103 U/L (ref 39–117)
BUN: 15 mg/dL (ref 6–23)
CALCIUM: 9.1 mg/dL (ref 8.4–10.5)
CHLORIDE: 107 meq/L (ref 96–112)
CO2: 21 mEq/L (ref 19–32)
CREATININE: 0.8 mg/dL (ref 0.50–1.35)
GFR calc Af Amer: >90 mL/min (ref >90)
GFR, EST NON AFRICAN AMERICAN: 85 mL/min — AB (ref >90)
Glucose, Bld: 118 mg/dL — ABNORMAL HIGH (ref 70–99)
Potassium: 4.1 mEq/L (ref 3.7–5.3)
Sodium: 143 mEq/L (ref 137–147)
Total Bilirubin: 0.6 mg/dL (ref 0.3–1.2)
Total Protein: 6.8 g/dL (ref 6.0–8.3)

## 2014-01-24 LAB — PROTIME-INR
INR: 1.08 (ref 0.00–1.49)
Prothrombin Time: 14 seconds (ref 11.6–15.2)

## 2014-01-24 LAB — TYPE AND SCREEN
ABO/RH(D): O POS
Antibody Screen: NEGATIVE

## 2014-01-24 LAB — CBC
HCT: 34.5 % — ABNORMAL LOW (ref 39.0–52.0)
Hemoglobin: 11.3 g/dL — ABNORMAL LOW (ref 13.0–17.0)
MCH: 28.9 pg (ref 26.0–34.0)
MCHC: 32.8 g/dL (ref 30.0–36.0)
MCV: 88.2 fL (ref 78.0–100.0)
Platelets: 202 10*3/uL (ref 150–400)
RBC: 3.91 MIL/uL — ABNORMAL LOW (ref 4.22–5.81)
RDW: 14.6 % (ref 11.5–15.5)
WBC: 5.4 10*3/uL (ref 4.0–10.5)

## 2014-01-24 LAB — APTT: aPTT: 35 seconds (ref 24–37)

## 2014-01-24 LAB — ABO/RH: ABO/RH(D): O POS

## 2014-01-24 NOTE — Progress Notes (Signed)
Primary - Dellis Filbert todd Cardiologist - dr. Rayann Heman No testing within last year

## 2014-01-24 NOTE — Progress Notes (Signed)
01/24/14 1508  OBSTRUCTIVE SLEEP APNEA  Have you ever been diagnosed with sleep apnea through a sleep study? No  Do you snore loudly (loud enough to be heard through closed doors)?  0  Do you often feel tired, fatigued, or sleepy during the daytime? 1  Has anyone observed you stop breathing during your sleep? 0  Do you have, or are you being treated for high blood pressure? 1  BMI more than 35 kg/m2? 0  Age over 76 years old? 1  Neck circumference greater than 40 cm/16 inches? 1  Gender: 1  Obstructive Sleep Apnea Score 5

## 2014-01-24 NOTE — Progress Notes (Signed)
Tomi Bamberger with [employer] notified regarding instructions from dr. Rayann Heman office regarding pacemaker management during and after procedure.

## 2014-01-24 NOTE — Pre-Procedure Instructions (Signed)
Nicholas Schmitt  01/24/2014   Your procedure is scheduled on:  Tuesday, August 18th  Report to [facility] Admitting at 530 AM.  Call this number if you have problems the morning of surgery: [phone]   Remember:   Do not eat food or drink liquids after midnight.   Take these medicines the morning of surgery with A SIP OF WATER: norvasc, symbicort, albuterol if needed   Do not wear jewelry.  Do not wear lotions, powders, or perfumes. You may wear deodorant.  Do not shave 48 hours prior to surgery. Men may shave face and neck.  Do not bring valuables to the hospital.  [facility] is not responsible for any belongings or valuables.               Contacts, dentures or bridgework may not be worn into surgery.  Leave suitcase in the car. After surgery it may be brought to your room.  For patients admitted to the hospital, discharge time is determined by yourtreatment team.               Patients discharged the day of surgery will not be allowed to drive home.  Please read over the following fact sheets that you were given: Pain Booklet, Coughing and Deep Breathing, Blood Transfusion Information and Surgical Site Infection Prevention  - Preparing for Surgery  Before surgery, you can play an important role.  Because skin is not sterile, your skin needs to be as free of germs as possible.  You can reduce the number of germs on you skin by washing with CHG (chlorahexidine gluconate) soap before surgery.  CHG is an antiseptic cleaner which kills germs and bonds with the skin to continue killing germs even after washing.  Please DO NOT use if you have an allergy to CHG or antibacterial soaps.  If your skin becomes reddened/irritated stop using the CHG and inform your nurse when you arrive at Short Stay.  Do not shave (including legs and underarms) for at least 48 hours prior to the first CHG shower.  You may shave your face.  Please follow these instructions  carefully:   1.  Shower with CHG Soap the night before surgery and the morning of Surgery.  2.  If you choose to wash your hair, wash your hair first as usual with your normal shampoo.  3.  After you shampoo, rinse your hair and body thoroughly to remove the shampoo.  4.  Use CHG as you would any other liquid soap.  You can apply CHG directly to the skin and wash gently with scrungie or a clean washcloth.  5.  Apply the CHG Soap to your body ONLY FROM THE NECK DOWN.  Do not use on open wounds or open sores.  Avoid contact with your eyes, ears, mouth and genitals (private parts).  Wash genitals (private parts) with your normal soap.  6.  Wash thoroughly, paying special attention to the area where your surgery will be performed.  7.  Thoroughly rinse your body with warm water from the neck down.  8.  DO NOT shower/wash with your normal soap after using and rinsing off the CHG Soap.  9.  Pat yourself dry with a clean towel.            10.  Wear clean pajamas.            11.  Place clean sheets on your bed the night of your first shower  and do not sleep with pets.  Day of Surgery  Do not apply any lotions/deoderants the morning of surgery.  Please wear clean clothes to the hospital/surgery center.

## 2014-01-27 MED ORDER — CEFUROXIME SODIUM 1.5 G IJ SOLR
1.5000 g | INTRAMUSCULAR | Status: AC
  Administered 2014-01-28: 1.5 g via INTRAVENOUS
  Filled 2014-01-27: qty 1.5

## 2014-01-28 ENCOUNTER — Ambulatory Visit (HOSPITAL_COMMUNITY): Payer: Medicare Other | Admitting: Certified Registered Nurse Anesthetist

## 2014-01-28 ENCOUNTER — Ambulatory Visit (HOSPITAL_COMMUNITY)
Admission: RE | Admit: 2014-01-28 | Discharge: 2014-01-28 | Disposition: A | Discharge status: Home / Self Care | Payer: Medicare Other | Source: Ambulatory Visit | Attending: Thoracic Surgery (Cardiothoracic Vascular Surgery) | Admitting: Thoracic Surgery (Cardiothoracic Vascular Surgery)

## 2014-01-28 ENCOUNTER — Other Ambulatory Visit: Payer: Self-pay

## 2014-01-28 ENCOUNTER — Other Ambulatory Visit: Payer: Self-pay | Admitting: *Deleted

## 2014-01-28 ENCOUNTER — Encounter (HOSPITAL_COMMUNITY): Payer: Medicare Other | Admitting: Certified Registered Nurse Anesthetist

## 2014-01-28 ENCOUNTER — Encounter (HOSPITAL_COMMUNITY): Payer: Self-pay | Admitting: *Deleted

## 2014-01-28 ENCOUNTER — Encounter (HOSPITAL_COMMUNITY)
Admission: RE | Disposition: A | Discharge status: Home / Self Care | Payer: Self-pay | Source: Ambulatory Visit | Attending: Thoracic Surgery (Cardiothoracic Vascular Surgery)

## 2014-01-28 DIAGNOSIS — I2510 Atherosclerotic heart disease of native coronary artery without angina pectoris: Secondary | ICD-10-CM | POA: Diagnosis not present

## 2014-01-28 DIAGNOSIS — Z8546 Personal history of malignant neoplasm of prostate: Secondary | ICD-10-CM | POA: Insufficient documentation

## 2014-01-28 DIAGNOSIS — Z87891 Personal history of nicotine dependence: Secondary | ICD-10-CM | POA: Insufficient documentation

## 2014-01-28 DIAGNOSIS — Z85828 Personal history of other malignant neoplasm of skin: Secondary | ICD-10-CM | POA: Insufficient documentation

## 2014-01-28 DIAGNOSIS — C3491 Malignant neoplasm of unspecified part of right bronchus or lung: Secondary | ICD-10-CM

## 2014-01-28 DIAGNOSIS — R590 Localized enlarged lymph nodes: Secondary | ICD-10-CM

## 2014-01-28 DIAGNOSIS — R918 Other nonspecific abnormal finding of lung field: Secondary | ICD-10-CM

## 2014-01-28 DIAGNOSIS — N189 Chronic kidney disease, unspecified: Secondary | ICD-10-CM | POA: Diagnosis not present

## 2014-01-28 DIAGNOSIS — I714 Abdominal aortic aneurysm, without rupture, unspecified: Secondary | ICD-10-CM | POA: Diagnosis not present

## 2014-01-28 DIAGNOSIS — Z7902 Long term (current) use of antithrombotics/antiplatelets: Secondary | ICD-10-CM | POA: Insufficient documentation

## 2014-01-28 DIAGNOSIS — I129 Hypertensive chronic kidney disease with stage 1 through stage 4 chronic kidney disease, or unspecified chronic kidney disease: Secondary | ICD-10-CM | POA: Insufficient documentation

## 2014-01-28 DIAGNOSIS — E785 Hyperlipidemia, unspecified: Secondary | ICD-10-CM | POA: Diagnosis not present

## 2014-01-28 DIAGNOSIS — Z9861 Coronary angioplasty status: Secondary | ICD-10-CM | POA: Insufficient documentation

## 2014-01-28 DIAGNOSIS — I252 Old myocardial infarction: Secondary | ICD-10-CM | POA: Insufficient documentation

## 2014-01-28 DIAGNOSIS — C771 Secondary and unspecified malignant neoplasm of intrathoracic lymph nodes: Secondary | ICD-10-CM | POA: Insufficient documentation

## 2014-01-28 DIAGNOSIS — C7951 Secondary malignant neoplasm of bone: Secondary | ICD-10-CM | POA: Insufficient documentation

## 2014-01-28 DIAGNOSIS — C7952 Secondary malignant neoplasm of bone marrow: Secondary | ICD-10-CM

## 2014-01-28 DIAGNOSIS — C3410 Malignant neoplasm of upper lobe, unspecified bronchus or lung: Secondary | ICD-10-CM

## 2014-01-28 DIAGNOSIS — R599 Enlarged lymph nodes, unspecified: Secondary | ICD-10-CM

## 2014-01-28 HISTORY — PX: MEDIASTINOSCOPY: SHX5086

## 2014-01-28 SURGERY — MEDIASTINOSCOPY
Anesthesia: General | Site: Neck

## 2014-01-28 MED ORDER — MIDAZOLAM HCL 2 MG/2ML IJ SOLN
INTRAMUSCULAR | Status: AC
  Filled 2014-01-28: qty 2

## 2014-01-28 MED ORDER — LIDOCAINE HCL (CARDIAC) 20 MG/ML IV SOLN
INTRAVENOUS | Status: DC | PRN
  Administered 2014-01-28: 20 mg via INTRAVENOUS

## 2014-01-28 MED ORDER — GLYCOPYRROLATE 0.2 MG/ML IJ SOLN
INTRAMUSCULAR | Status: AC
  Filled 2014-01-28: qty 2

## 2014-01-28 MED ORDER — SODIUM CHLORIDE 0.9 % IJ SOLN: 3.0000 mL | Freq: Two times a day (BID) | INTRAMUSCULAR | Status: DC

## 2014-01-28 MED ORDER — PROPOFOL 10 MG/ML IV BOLUS
INTRAVENOUS | Status: AC
  Filled 2014-01-28: qty 20

## 2014-01-28 MED ORDER — LACTATED RINGERS IV SOLN
INTRAVENOUS | Status: DC | PRN
  Administered 2014-01-28: 08:00:00 via INTRAVENOUS

## 2014-01-28 MED ORDER — PHENYLEPHRINE HCL 10 MG/ML IJ SOLN
10.0000 mg | INTRAVENOUS | Status: DC | PRN
  Administered 2014-01-28: 20 ug/min via INTRAVENOUS

## 2014-01-28 MED ORDER — ONDANSETRON HCL 4 MG/2ML IJ SOLN
INTRAMUSCULAR | Status: AC
  Filled 2014-01-28: qty 2

## 2014-01-28 MED ORDER — SODIUM CHLORIDE 0.9 % IJ SOLN: 3.0000 mL | INTRAMUSCULAR | Status: DC | PRN

## 2014-01-28 MED ORDER — FENTANYL CITRATE 0.05 MG/ML IJ SOLN
INTRAMUSCULAR | Status: AC
  Filled 2014-01-28: qty 5

## 2014-01-28 MED ORDER — SODIUM CHLORIDE 0.9 % IJ SOLN
INTRAMUSCULAR | Status: AC
  Filled 2014-01-28: qty 10

## 2014-01-28 MED ORDER — FENTANYL CITRATE 0.05 MG/ML IJ SOLN
INTRAMUSCULAR | Status: DC | PRN
  Administered 2014-01-28 (×2): 25 ug via INTRAVENOUS
  Administered 2014-01-28: 50 ug via INTRAVENOUS
  Administered 2014-01-28: 100 ug via INTRAVENOUS

## 2014-01-28 MED ORDER — ACETAMINOPHEN 325 MG PO TABS
650.0000 mg | ORAL_TABLET | ORAL | Status: DC | PRN
  Filled 2014-01-28: qty 2

## 2014-01-28 MED ORDER — LIDOCAINE HCL (CARDIAC) 20 MG/ML IV SOLN
INTRAVENOUS | Status: AC
  Filled 2014-01-28: qty 10

## 2014-01-28 MED ORDER — ROCURONIUM BROMIDE 100 MG/10ML IV SOLN
INTRAVENOUS | Status: DC | PRN
  Administered 2014-01-28: 35 mg via INTRAVENOUS

## 2014-01-28 MED ORDER — OXYCODONE HCL 5 MG PO TABS
5.0000 mg | ORAL_TABLET | Freq: Once | ORAL | Status: AC
  Administered 2014-01-28: 5 mg via ORAL

## 2014-01-28 MED ORDER — SUCCINYLCHOLINE CHLORIDE 20 MG/ML IJ SOLN
INTRAMUSCULAR | Status: AC
  Filled 2014-01-28: qty 1

## 2014-01-28 MED ORDER — OXYCODONE HCL 5 MG PO TABS: 5.0000 mg | ORAL_TABLET | ORAL | Status: DC | PRN

## 2014-01-28 MED ORDER — HEMOSTATIC AGENTS (NO CHARGE) OPTIME
TOPICAL | Status: DC | PRN
  Administered 2014-01-28 (×2): 1 via TOPICAL

## 2014-01-28 MED ORDER — FENTANYL CITRATE 0.05 MG/ML IJ SOLN: 25.0000 ug | INTRAMUSCULAR | Status: DC | PRN

## 2014-01-28 MED ORDER — DROPERIDOL 2.5 MG/ML IJ SOLN
0.6250 mg | INTRAMUSCULAR | Status: DC | PRN
  Filled 2014-01-28: qty 0.25

## 2014-01-28 MED ORDER — NEOSTIGMINE METHYLSULFATE 10 MG/10ML IV SOLN
INTRAVENOUS | Status: DC | PRN
  Administered 2014-01-28: 3 mg via INTRAVENOUS

## 2014-01-28 MED ORDER — ONDANSETRON HCL 4 MG/2ML IJ SOLN
INTRAMUSCULAR | Status: DC | PRN
  Administered 2014-01-28: 4 mg via INTRAVENOUS

## 2014-01-28 MED ORDER — GLYCOPYRROLATE 0.2 MG/ML IJ SOLN
INTRAMUSCULAR | Status: DC | PRN
  Administered 2014-01-28: .4 mg via INTRAVENOUS

## 2014-01-28 MED ORDER — PHENYLEPHRINE 40 MCG/ML (10ML) SYRINGE FOR IV PUSH (FOR BLOOD PRESSURE SUPPORT)
PREFILLED_SYRINGE | INTRAVENOUS | Status: AC
  Filled 2014-01-28: qty 10

## 2014-01-28 MED ORDER — ACETAMINOPHEN 650 MG RE SUPP
650.0000 mg | RECTAL | Status: DC | PRN
  Filled 2014-01-28: qty 1

## 2014-01-28 MED ORDER — EPHEDRINE SULFATE 50 MG/ML IJ SOLN
INTRAMUSCULAR | Status: AC
  Filled 2014-01-28: qty 1

## 2014-01-28 MED ORDER — PROPOFOL 10 MG/ML IV BOLUS
INTRAVENOUS | Status: DC | PRN
  Administered 2014-01-28 (×2): 100 mg via INTRAVENOUS

## 2014-01-28 MED ORDER — 0.9 % SODIUM CHLORIDE (POUR BTL) OPTIME
TOPICAL | Status: DC | PRN
  Administered 2014-01-28: 1000 mL

## 2014-01-28 MED ORDER — ROCURONIUM BROMIDE 50 MG/5ML IV SOLN
INTRAVENOUS | Status: AC
  Filled 2014-01-28: qty 1

## 2014-01-28 MED ORDER — SODIUM CHLORIDE 0.9 % IV SOLN: 250.0000 mL | INTRAVENOUS | Status: DC | PRN

## 2014-01-28 MED ORDER — OXYCODONE HCL 5 MG PO TABS
ORAL_TABLET | ORAL | Status: AC
  Filled 2014-01-28: qty 1

## 2014-01-28 SURGICAL SUPPLY — 48 items
APPLIER CLIP LOGIC TI 5 (MISCELLANEOUS) IMPLANT
BLADE SURG 15 STRL LF DISP TIS (BLADE) ×1 IMPLANT
BLADE SURG 15 STRL SS (BLADE) ×2
CANISTER SUCTION 2500CC (MISCELLANEOUS) ×3 IMPLANT
CLEANER TIP ELECTROSURG 2X2 (MISCELLANEOUS) ×3 IMPLANT
CLIP TI MEDIUM 6 (CLIP) ×3 IMPLANT
CONT SPEC 4OZ CLIKSEAL STRL BL (MISCELLANEOUS) ×12 IMPLANT
COVER SURGICAL LIGHT HANDLE (MISCELLANEOUS) ×3 IMPLANT
DERMABOND ADHESIVE PROPEN (GAUZE/BANDAGES/DRESSINGS) ×2
DERMABOND ADVANCED (GAUZE/BANDAGES/DRESSINGS)
DERMABOND ADVANCED .7 DNX12 (GAUZE/BANDAGES/DRESSINGS) IMPLANT
DERMABOND ADVANCED .7 DNX6 (GAUZE/BANDAGES/DRESSINGS) ×1 IMPLANT
DRAPE CHEST BREAST 15X10 FENES (DRAPES) ×3 IMPLANT
ELECT REM PT RETURN 9FT ADLT (ELECTROSURGICAL) ×3
ELECTRODE REM PT RTRN 9FT ADLT (ELECTROSURGICAL) ×1 IMPLANT
GAUZE SPONGE 4X4 12PLY STRL (GAUZE/BANDAGES/DRESSINGS) ×3 IMPLANT
GAUZE SPONGE 4X4 16PLY XRAY LF (GAUZE/BANDAGES/DRESSINGS) ×3 IMPLANT
GLOVE SURG SIGNA 7.5 PF LTX (GLOVE) ×3 IMPLANT
GOWN STRL REUS W/ TWL LRG LVL3 (GOWN DISPOSABLE) ×1 IMPLANT
GOWN STRL REUS W/ TWL XL LVL3 (GOWN DISPOSABLE) ×2 IMPLANT
GOWN STRL REUS W/TWL LRG LVL3 (GOWN DISPOSABLE) ×2
GOWN STRL REUS W/TWL XL LVL3 (GOWN DISPOSABLE) ×4
HEMOSTAT SURGICEL 2X14 (HEMOSTASIS) ×6 IMPLANT
KIT BASIN OR (CUSTOM PROCEDURE TRAY) ×3 IMPLANT
KIT ROOM TURNOVER OR (KITS) ×3 IMPLANT
NS IRRIG 1000ML POUR BTL (IV SOLUTION) ×3 IMPLANT
PACK SURGICAL SETUP 50X90 (CUSTOM PROCEDURE TRAY) ×3 IMPLANT
PAD ARMBOARD 7.5X6 YLW CONV (MISCELLANEOUS) ×6 IMPLANT
PENCIL BUTTON HOLSTER BLD 10FT (ELECTRODE) ×3 IMPLANT
SPONGE GAUZE 4X4 12PLY STER LF (GAUZE/BANDAGES/DRESSINGS) ×3 IMPLANT
SPONGE INTESTINAL PEANUT (DISPOSABLE) ×3 IMPLANT
SUT SILK 2 0 TIES 10X30 (SUTURE) IMPLANT
SUT VIC AB 2-0 CT1 27 (SUTURE) ×2
SUT VIC AB 2-0 CT1 TAPERPNT 27 (SUTURE) ×1 IMPLANT
SUT VIC AB 3-0 SH 18 (SUTURE) IMPLANT
SUT VIC AB 3-0 SH 27 (SUTURE)
SUT VIC AB 3-0 SH 27X BRD (SUTURE) IMPLANT
SUT VICRYL 4-0 PS2 18IN ABS (SUTURE) ×3 IMPLANT
SWAB COLLECTION DEVICE MRSA (MISCELLANEOUS) IMPLANT
SYR BULB 3OZ (MISCELLANEOUS) ×3 IMPLANT
SYRINGE 10CC LL (SYRINGE) ×3 IMPLANT
TAPE CLOTH SURG 4X10 WHT LF (GAUZE/BANDAGES/DRESSINGS) ×3 IMPLANT
TOWEL OR 17X24 6PK STRL BLUE (TOWEL DISPOSABLE) ×6 IMPLANT
TOWEL OR 17X26 10 PK STRL BLUE (TOWEL DISPOSABLE) ×3 IMPLANT
TUBE ANAEROBIC SPECIMEN COL (MISCELLANEOUS) IMPLANT
TUBE CONNECTING 12'X1/4 (SUCTIONS) ×1
TUBE CONNECTING 12X1/4 (SUCTIONS) ×2 IMPLANT
WATER STERILE IRR 1000ML POUR (IV SOLUTION) ×3 IMPLANT

## 2014-01-28 NOTE — Anesthesia Preprocedure Evaluation (Addendum)
Anesthesia Evaluation  Patient identified by MRN, date of birth, ID band Patient awake    Reviewed: Allergy & Precautions, H&P , NPO status , Patient's Chart, lab work & pertinent test results  History of Anesthesia Complications Negative for: history of anesthetic complications  Airway Mallampati: II TM Distance: >3 FB Neck ROM: Full    Dental  (+) Edentulous Upper, Edentulous Lower   Pulmonary asthma , COPD COPD inhaler, former smoker (quit '95),  Mediastinal adenopathy breath sounds clear to auscultation        Cardiovascular hypertension, Pt. on medications - angina+ CAD, + Past MI (in '90s) and + Peripheral Vascular Disease (AAA 3.2 x 3.4) + pacemaker (for SSS) Rhythm:Regular Rate:Normal  '14 ECHO: EF 50-55%, valves OK   Neuro/Psych negative neurological ROS     GI/Hepatic negative GI ROS, Neg liver ROS,   Endo/Other  negative endocrine ROS  Renal/GU negative Renal ROS     Musculoskeletal   Abdominal   Peds  Hematology  (+) Blood dyscrasia (INR 1.08, Hb 11.3), ,   Anesthesia Other Findings   Reproductive/Obstetrics                       Anesthesia Physical Anesthesia Plan  ASA: III  Anesthesia Plan: General   Post-op Pain Management:    Induction: Intravenous  Airway Management Planned: Oral ETT  Additional Equipment:   Intra-op Plan:   Post-operative Plan: Extubation in OR  Informed Consent: I have reviewed the patients History and Physical, chart, labs and discussed the procedure including the risks, benefits and alternatives for the proposed anesthesia with the patient or authorized representative who has indicated his/her understanding and acceptance.   Dental advisory given  Plan Discussed with: CRNA and Surgeon  Anesthesia Plan Comments: (Plan routine monitors, GETA)        Anesthesia Quick Evaluation

## 2014-01-28 NOTE — Anesthesia Procedure Notes (Addendum)
Procedure Name: Intubation Date/Time: 01/28/2014 8:17 AM Performed by: Trixie Deis A Pre-anesthesia Checklist: Patient identified, Emergency Drugs available, Suction available, Patient being monitored and Timeout performed Patient Re-evaluated:Patient Re-evaluated prior to inductionOxygen Delivery Method: Circle system utilized Preoxygenation: Pre-oxygenation with 100% oxygen Intubation Type: IV induction Ventilation: Oral airway inserted - appropriate to patient size and Mask ventilation without difficulty Laryngoscope Size: Mac and 3 Grade View: Grade II Tube type: Oral Tube size: 8.0 mm Number of attempts: 1 Placement Confirmation: ETT inserted through vocal cords under direct vision,  breath sounds checked- equal and bilateral,  positive ETCO2 and CO2 detector Secured at: 22 cm Tube secured with: Tape

## 2014-01-28 NOTE — H&P (View-Only) (Signed)
PCP is Nicholas Schmitt Nicholas Resides, MD Referring Provider is Nicholas Post, MD  Chief Complaint  Patient presents with  . NEW THORACIC       UPPER LOBE MASS CTA CHEST 01/10/14, PET Scan 01/17/14    HPI: 78 yo man sent for consultation regarding a right lung nodule with mediastinal adenopathy.  Nicholas Schmitt is a 78 year old gentleman with a history of coronary disease and prostate cancer who presented Dr. Elease Schmitt with a complaint of right-sided back pain under the shoulder blade and shortness of breath. He describes his pain as a very severe aching pain. It sometimes seems to be related to movement. He also experiences a duller pain when he lies down at night. He has had a cough and wheezing. He had one episode of hemoptysis about a week ago, but has not had any since then.   He says that he used to be able to walk about a mile without getting short of breath now he is down to a half mile to three quarters of a mile. He's lost 8 pounds over the past 3 months. He says that he said decreased energy and a poor appetite.  He has a history of coronary disease with an MI in 1995. He had 2 stents placed in 2006. He has been on Plavix since then. He has had his Plavix stopped on several occasions for procedures and has not had any complications. He had a pacemaker placed in 2014. He has not had any recent chest pain or tightness reminiscent of his coronary disease.    Past Medical History  Diagnosis Date  . Hypertension   . CAD (coronary artery disease)     positive stress test in 2006 led to left heart cath (8/06) showing 95% prox RCA, 90% CFX, and 90% mLAD.  patient had a cypher DES to all 3 lesions  . Hyperlipidemia   . Hearing loss   . Cancer     prostate Tx with prostatectomy  . AAA (abdominal aortic aneurysm) 11/09    3.2 cm   . Asthma   . Prostate cancer 2005    gleason 7  . Chronic renal insufficiency   . History of radiation therapy 03/08/04- 04/08/04    prostate 4600 cGy in 3 fractions,  radioactive seed implant 05/04/04  . Myocardial infarction 1995  . Skin cancer     ear    Past Surgical History  Procedure Laterality Date  . Prostatectomy  2005  . Ptca  2006  . Coronary stent placement    . Permanent pacemaker insertion Bilateral 02/26/13    MDT Adapta L implanted by Dr Nicholas Schmitt for sick sinus syndrome  . Knee surgery      pt denies  . Radioactive seed implant  05/04/2004    8000 cGy, Dr Nicholas Schmitt Dr Nicholas Schmitt  . Elbow surgery Bilateral 1973    Family History  Problem Relation Age of Onset  . Asthma Sister   . Asthma Brother   . Coronary artery disease Other     Social History History  Substance Use Topics  . Smoking status: Former Smoker -- 0.50 packs/day    Quit date: 07/30/1993  . Smokeless tobacco: Not on file  . Alcohol Use: No     Comment: hx alcohol abuse per note dated 04/11/11    Current Outpatient Prescriptions  Medication Sig Dispense Refill  . albuterol (PROVENTIL,VENTOLIN) 90 MCG/ACT inhaler Inhale 2 puffs into the lungs every 6 (six) hours as needed.  17 g  3  .  amLODipine (NORVASC) 10 MG tablet Take 1 tablet (10 mg total) by mouth daily.  100 tablet  3  . atorvastatin (LIPITOR) 40 MG tablet Take 1 tablet (40 mg total) by mouth daily.  100 tablet  3  . budesonide-formoterol (SYMBICORT) 160-4.5 MCG/ACT inhaler Inhale 1 puff into the lungs as needed (shortness of breath).  4 Inhaler  3  . clopidogrel (PLAVIX) 75 MG tablet Take 75 mg by mouth daily. HOLD      . losartan (COZAAR) 100 MG tablet Take 1 tablet (100 mg total) by mouth daily.  100 tablet  3  . Multiple Vitamin (MULTIVITAMIN) tablet Take 1 tablet by mouth daily. HOLD      . nitroGLYCERIN (NITROSTAT) 0.4 MG SL tablet Place 1 tablet (0.4 mg total) under the tongue every 5 (five) minutes as needed.  25 tablet  1   No current facility-administered medications for this visit.    No Known Allergies  Review of Systems  Constitutional: Positive for fatigue and unexpected weight  change (lost 8 pounds in 3 months).  HENT: Positive for hearing loss.   Respiratory: Positive for cough (1 episode of hemoptysis), shortness of breath (walking 1/2- 3/4 mile) and wheezing.   Cardiovascular: Negative for chest pain.  Neurological: Negative for dizziness and weakness.  Hematological: Bruises/bleeds easily (on plavix).  All other systems reviewed and are negative.   BP 118/74  Pulse 71  Ht 5\' 11"  (1.803 m)  Wt 211 lb (95.709 kg)  BMI 29.44 kg/m2  SpO2 98% Physical Exam  Vitals reviewed. Constitutional: He is oriented to person, place, and time. He appears well-developed and well-nourished. No distress.  HENT:  Head: Normocephalic and atraumatic.  Eyes: EOM are normal. Pupils are equal, round, and reactive to light.  Neck: Neck supple. No thyromegaly present.  Cardiovascular: Normal rate and regular rhythm.   Murmur (2/6 systolic) heard. Pulmonary/Chest: Effort normal and breath sounds normal. He has no wheezes. He has no rales.  Abdominal: Soft. There is no tenderness.  Musculoskeletal: He exhibits no edema.  Lymphadenopathy:    He has no cervical adenopathy.  Neurological: He is alert and oriented to person, place, and time. No cranial nerve deficit.  Skin: Skin is warm and dry.     Diagnostic Tests: CT ANGIOGRAPHY CHEST WITH CONTRAST  TECHNIQUE:  Multidetector CT imaging of the chest was performed using the  standard protocol during bolus administration of intravenous  contrast. Multiplanar CT image reconstructions and MIPs were  obtained to evaluate the vascular anatomy.  CONTRAST: 167mL OMNIPAQUE IOHEXOL 350 MG/ML SOLN  COMPARISON: Chest CT July 22, 2010 and chest radiograph January 07, 2014  FINDINGS:  There is no demonstrable pulmonary embolus. There is atherosclerotic  change in the aorta but no aneurysm or dissection appreciable.  There is underlying centrilobular emphysema. There is a somewhat  irregular mass in the posterior segment of the  right upper lobe  measuring 1.7 x 1.2 cm, best seen on slice 12 series 6. There is a  smaller mass in the posterior segment of the right upper lobe near  the apex measuring 5 x 4 mm seen on slice 9 series 6. There is  patchy infiltrate in medial inferior aspect of the right middle  lobe. 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  There is an enlarged anterior mediastinal lymph node measuring 2.5  by 2.3 cm. There is adenopathy in the right mediastinum just  anterior to the carina measuring 2.7 x 2.4 cm. There is right hilar  adenopathy measuring 3.0 by 2.3 cm. There is a mildly prominent sub-  carinal lymph node measuring 2.2 x 1.3 cm.  There is a small hiatal hernia with thickening of the esophagus  throughout much of its course.  The pericardium is not thickened. There are multiple foci of  coronary artery calcification.  There are small cysts in the liver, stable from prior study.  Incomplete visualization of upper pole left renal cysts is again  noted. There is a mass in the right adrenal gland which was not  present previously measuring 2.1 x 1.3 cm.  There are no blastic or lytic bone lesions apparent. Pacemaker leads  are attached to the right atrium and right ventricle. Thyroid  appears unremarkable.  Review of the MIP images confirms the above findings.  IMPRESSION:  No demonstrable pulmonary embolus.  Irregular nodular lesion in the right upper lobe posteriorly  suspicious for neoplasm. There is adenopathy at multiple sites.  There is also a new small right adrenal mass. Neoplastic etiology  for these findings is certainly suspected. These findings may  warrant correlation with nuclear medicine PET study to further  assess.  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 There is underlying emphysematous change.  Small hiatal hernia. Somewhat  diffuse esophageal thickening raising  concern for chronic reflux esophagitis.  These results will be called to the ordering clinician or  representative by the Radiologist Assistant, and communication  documented in the PACS or zVision Dashboard.  Electronically Signed  By: Lowella Grip M.D.  On: 01/09/2014 15:22  NUCLEAR MEDICINE PET SKULL BASE TO THIGH  TECHNIQUE:  13.0 mCi F-18 FDG was injected intravenously. Full-ring PET imaging  was performed from the skull base to thigh after the radiotracer. CT  data was obtained and used for attenuation correction and anatomic  localization.  FASTING BLOOD GLUCOSE: Value: 112 mg/dl  COMPARISON: CT chest dated 01/09/2014. Bone scan dated 07/02/2013.  CT abdomen pelvis dated 07/03/2011.  FINDINGS:  NECK  6 mm short axis high right supraclavicular node (series 4/ image  45), max SUV 4.3.  CHEST  14 x 12 mm spiculated posterior right upper lobe nodule (series  8/image 17), corresponding to the CT abnormality, max SUV 1.6. While  technically indeterminate, given the associated findings, this  appearance is worrisome for primary bronchogenic neoplasm.  Evaluation of lung parenchyma is constrained by respiratory motion.  Underlying emphysematous changes. No pleural effusion or  pneumothorax.  Widespread thoracic lymphadenopathy, including:  --10 mm short axis right supraclavicular/level 7 node series 4/  image 50), max SUV 14.1  --8 mm short axis node at the left thoracic inlet (series 4/ image  49), max SUV 11.8  --8 mm short axis high right paratracheal node (series 4/image 58),  max SUV 13.4  --1.7 cm short axis right prevascular node (series 4/ image 66), max  SUV 13.9  --1.8 cm short axis right paratracheal node (series 4/image 56), max  SUV 13.4  --1.8 cm short axis right hilar node (series 4/ image 71), max SUV  15.8  The heart is normal in size. Left subclavian pacemaker. Coronary  atherosclerosis.  ABDOMEN/PELVIS  1.6 x  1.1 cm right adrenal nodule, max SUV 8.7, suspicious for  metastasis.  No abnormal hypermetabolic activity within the liver, pancreas, or  spleen.  No hypermetabolic lymph nodes in the abdomen or pelvis.  Brachytherapy seeds in the prostate.  Small fat containing  left inguinal hernia. Vascular calcifications.  3.1 x 3.4 cm infrarenal abdominal aortic aneurysm.  SKELETON  18 mm lytic lesion in the right L5 vertebral body, max SUV 10.5.  Additional lytic lesion in the right sacrum (series 4/ image 164),  max SUV 8.8.  IMPRESSION:  Mildly hypermetabolic spiculated nodule in the posterior right upper  lobe, suspicious for primary bronchogenic neoplasm.  Extensive thoracic lymphadenopathy, including a hypermetabolic right  supraclavicular node. Consider percutaneous biopsy of this node for  tissue confirmation.  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  Osseous metastases involving the right L5 vertebral body and right  sacrum.  Electronically Signed:  By: Julian Hy M.D.  On: 01/17/2014 09:55    Impression: 78 year old gentleman with a new right upper lobe lung nodule with associated hilar, mediastinal, and supraclavicular adenopathy. PET/CT shows these areas to be hypermetabolic. He also has a hypermetabolic right adrenal lesion and a lytic lesion in the L5 vertebral body and right sacrum. These findings are most consistent with stage IV lung cancer. We do need to do a biopsy to get a tissue diagnosis to confirm that suspicion.  The easiest approach would be to do a supraclavicular node biopsy, but I am not able to palpate the supraclavicular node on exam.  I think the most definitive procedure would be to do a mediastinoscopy. This would allow [place] to obtain a diagnosis as well as obtain enough tissue for molecular testing.  I discussed the proposed procedure of mediastinoscopy with Nicholas Schmitt. We discussed the indications, risks, benefits, and alternatives. It would be done in the  operating room under general anesthesia. I described the incision to him. We'll plan to do this on an outpatient procedure. He understands the risks include, but are not limited to death, MI, stroke, DVT, bleeding, possible need for transfusion, infection, pneumothorax, as well as the possibility of unforeseeable complications.  He accepts the risks and agrees to proceed.  We will plan to hold his Plavix after his dose tomorrow in preparation for surgery on 01/28/2014  Plan: Mediastinoscopy on Tuesday, 01/28/2014

## 2014-01-28 NOTE — Op Note (Signed)
NAMECARVELL, HOEFFNER NO.:  [id number]  MEDICAL RECORD NO.:  [id number]  LOCATION:  [facility]                         FACILITY:  [facility]  PHYSICIAN:  Revonda Standard. Roxan Hockey, M.D.DATE OF BIRTH:  1935-10-20  DATE OF PROCEDURE:  01/28/2014 DATE OF DISCHARGE:  01/28/2014                              OPERATIVE REPORT   PREOPERATIVE DIAGNOSIS:  Right lung mass with mediastinal adenopathy.  POSTOPERATIVE DIAGNOSIS:  Metastatic non-small cell carcinoma, clinical stage IV.  PROCEDURE:  Mediastinoscopy.  SURGEON:  Revonda Standard. Roxan Hockey, M.D.  ANESTHESIA:  General.  FINDINGS:  Enlarged 4R lymph nodes.  Frozen section revealed metastatic non-small cell carcinoma.  CLINICAL NOTE:  Nicholas Schmitt is a 78 year old gentleman who presented with right-sided back pain and shortness of breath.  He also has weight loss and poor appetite.  Workup included a CT of the chest and a PET-CT, which showed widespread hypermetabolic lesions including a right lung mass, mediastinal adenopathy and bone metastases.  The patient was advised to undergo mediastinoscopy for diagnosis to allow sufficient tissue for molecular testing.  The indications, risks, benefits, and alternatives were discussed in detail with the patient.  He understood and accepted the risks and agreed to proceed.  OPERATIVE NOTE:  Mr. Zaring was brought to the operating room on January 28, 2014.  Arterial blood pressure monitoring line was placed and intravenous access was established.  The patient was anesthetized and intubated.  Intravenous antibiotics were administered.  The neck and chest were prepped and draped in usual sterile fashion.  An incision was made 1 fingerbreadth above the sternal notch.  It was carried through the skin and subcutaneous tissue.  The strap muscles were separated.  The pretracheal fascia was identified and incised.  The pretracheal plane was developed bluntly into the mediastinum. The  mediastinoscope was inserted.  There was a markedly enlarged 4R lymph node.  Multiple biopsies were taken.  These were sent for frozen section.  While awaiting results of frozen section, additional biopsies were taken.  Inspection was made for hemostasis.  The wound was packed with gauze.  Frozen section subsequently returned showing metastatic non- small cell carcinoma.  The packing was removed.  A final inspection was made.  There was good hemostasis.  The incision was closed in two layers with an interrupted 3-0 Vicryl platysmal closure and a 4-0 Vicryl subcuticular closure.  All sponge, needle and instrument counts were correct at the end of the procedure.  The patient was taken from the operating room to the postanesthetic care unit in good condition.     Revonda Standard Roxan Hockey, M.D.     SCH/MEDQ  D:  01/28/2014  T:  01/28/2014  Job:  [id number]

## 2014-01-28 NOTE — Discharge Instructions (Addendum)
Do not drive or engage in heavy physical activity for 24 hours.  Do not drive after taking pain medication  You have a prescription for oxycodone, a narcotic, for pain. You may supplement or substitute with tylenol.\  There is a medical adhesive on your incision. It will begin to peel off in 7- 10 days  You may shower tomorrow  My office will contact you with follow up information  What to eat:  For your first meals, you should eat lightly; only small meals initially.  If you do not have nausea, you may eat larger meals.  Avoid spicy, greasy and heavy food.    General Anesthesia, Adult, Care After  Refer to this sheet in the next few weeks. These instructions provide you with information on caring for yourself after your procedure. Your health care provider may also give you more specific instructions. Your treatment has been planned according to current medical practices, but problems sometimes occur. Call your health care provider if you have any problems or questions after your procedure.  WHAT TO EXPECT AFTER THE PROCEDURE  After the procedure, it is typical to experience:  Sleepiness.  Nausea and vomiting. HOME CARE INSTRUCTIONS  For the first 24 hours after general anesthesia:  Have a responsible person with you.  Do not drive a car. If you are alone, do not take public transportation.  Do not drink alcohol.  Do not take medicine that has not been prescribed by your health care provider.  Do not sign important papers or make important decisions.  You may resume a normal diet and activities as directed by your health care provider.  Change bandages (dressings) as directed.  If you have questions or problems that seem related to general anesthesia, call the hospital and ask for the anesthetist or anesthesiologist on call. SEEK MEDICAL CARE IF:  You have nausea and vomiting that continue the day after anesthesia.  You develop a rash. SEEK IMMEDIATE MEDICAL CARE IF:  You have  difficulty breathing.  You have chest pain.  You have any allergic problems. Document Released: 09/05/2000 Document Revised: 01/30/2013 Document Reviewed: 12/13/2012  [facility] Patient Information 2014 [place], [place].

## 2014-01-28 NOTE — Anesthesia Postprocedure Evaluation (Signed)
  Anesthesia Post-op Note  Patient: Nicholas Schmitt  Procedure(s) Performed: Procedure(s): MEDIASTINOSCOPY (N/A)  Patient Location: PACU  Anesthesia Type:General  Level of Consciousness: awake, alert , oriented and patient cooperative  Airway and Oxygen Therapy: Patient Spontanous Breathing and Patient connected to nasal cannula oxygen  Post-op Pain: mild  Post-op Assessment: Post-op Vital signs reviewed, Patient's Cardiovascular Status Stable, Respiratory Function Stable, Patent Airway, No signs of Nausea or vomiting and Pain level controlled  Post-op Vital Signs: Reviewed and stable  Last Vitals:  Filed Vitals:   01/28/14 1013  BP: 147/73  Pulse: 59  Temp:   Resp: 15    Complications: No apparent anesthesia complications

## 2014-01-28 NOTE — Brief Op Note (Signed)
01/28/2014  9:21 AM  PATIENT:  [name]  78 y.o. male  PRE-OPERATIVE DIAGNOSIS:  right lung mass, mediastinal adenopathy  POST-OPERATIVE DIAGNOSIS:  right lung mass, mediastinal adenopathy  PROCEDURE:  Procedure(s): MEDIASTINOSCOPY (N/A)  SURGEON:  Surgeon(s) and Role:    * Melrose Nakayama, MD - Primary   ASSISTANTS: none   ANESTHESIA:   general  EBL:  Total I/O In: 1550 [I.V.:1550] Out: -   BLOOD ADMINISTERED:none  DRAINS: none   LOCAL MEDICATIONS USED:  NONE  SPECIMEN:  Source of Specimen:  4R lymph nodes  DISPOSITION OF SPECIMEN:  PATHOLOGY  COUNTS:  YES  PLAN OF CARE: Discharge to home after PACU  PATIENT DISPOSITION:  PACU - hemodynamically stable.   Delay start of Pharmacological VTE agent (>24hrs) due to surgical blood loss or risk of bleeding: not applicable  Frozen - metastatic non-small cell carcinoma

## 2014-01-28 NOTE — Interval H&P Note (Signed)
History and Physical Interval Note:  01/28/2014 7:52 AM  Nicholas Schmitt  has presented today for surgery, with the diagnosis of right lung mass, mediastinal adenopathy  The various methods of treatment have been discussed with the patient and family. After consideration of risks, benefits and other options for treatment, the patient has consented to  Procedure(s): MEDIASTINOSCOPY (N/A) as a surgical intervention .  The patient's history has been reviewed, patient examined, no change in status, stable for surgery.  I have reviewed the patient's chart and labs.  Questions were answered to the patient's satisfaction.     Delrae Hagey C

## 2014-01-28 NOTE — Transfer of Care (Signed)
Immediate Anesthesia Transfer of Care Note  Patient: Nicholas Schmitt  Procedure(s) Performed: Procedure(s): MEDIASTINOSCOPY (N/A)  Patient Location: PACU  Anesthesia Type:General  Level of Consciousness: awake, alert  and oriented  Airway & Oxygen Therapy: Patient Spontanous Breathing and Patient connected to nasal cannula oxygen  Post-op Assessment: Report given to PACU RN, Post -op Vital signs reviewed and stable and Patient moving all extremities  Post vital signs: Reviewed and stable  Complications: No apparent anesthesia complications

## 2014-01-29 ENCOUNTER — Encounter (HOSPITAL_COMMUNITY): Payer: Self-pay | Admitting: Thoracic Surgery (Cardiothoracic Vascular Surgery)

## 2014-01-29 ENCOUNTER — Encounter: Payer: Self-pay | Admitting: *Deleted

## 2014-01-29 ENCOUNTER — Ambulatory Visit
Admission: RE | Admit: 2014-01-29 | Discharge: 2014-01-29 | Disposition: A | Discharge status: Home / Self Care | Payer: Medicare Other | Source: Ambulatory Visit | Attending: Thoracic Surgery (Cardiothoracic Vascular Surgery) | Admitting: Thoracic Surgery (Cardiothoracic Vascular Surgery)

## 2014-01-29 DIAGNOSIS — C3491 Malignant neoplasm of unspecified part of right bronchus or lung: Secondary | ICD-10-CM

## 2014-01-29 MED ORDER — IOHEXOL 300 MG/ML  SOLN: 75.0000 mL | Freq: Once | INTRAMUSCULAR | Status: AC | PRN

## 2014-01-29 NOTE — [facility] Oncology Navigator Note (Signed)
I called patient to notify him of his [facility] appointment at 3:00 PM 01/30/14.  At patient's request, information of arrival time and location given to patient's friend who verbalized understanding.

## 2014-01-30 ENCOUNTER — Ambulatory Visit (HOSPITAL_BASED_OUTPATIENT_CLINIC_OR_DEPARTMENT_OTHER): Payer: Medicare Other

## 2014-01-30 ENCOUNTER — Encounter: Payer: Self-pay | Admitting: Radiation Oncology

## 2014-01-30 ENCOUNTER — Ambulatory Visit: Payer: Medicare Other | Attending: Physical Therapy | Admitting: Physical Therapy

## 2014-01-30 ENCOUNTER — Ambulatory Visit
Admission: RE | Admit: 2014-01-30 | Discharge: 2014-01-30 | Disposition: A | Discharge status: Home / Self Care | Payer: Medicare Other | Source: Ambulatory Visit | Attending: Radiation Oncology | Admitting: Radiation Oncology

## 2014-01-30 ENCOUNTER — Encounter: Payer: Self-pay | Admitting: Internal Medicine

## 2014-01-30 ENCOUNTER — Encounter: Payer: Self-pay | Admitting: *Deleted

## 2014-01-30 ENCOUNTER — Ambulatory Visit (HOSPITAL_BASED_OUTPATIENT_CLINIC_OR_DEPARTMENT_OTHER): Payer: Medicare Other | Admitting: Internal Medicine

## 2014-01-30 ENCOUNTER — Other Ambulatory Visit (HOSPITAL_BASED_OUTPATIENT_CLINIC_OR_DEPARTMENT_OTHER): Payer: Medicare Other

## 2014-01-30 VITALS — BP 137/68 | HR 79 | Temp 98.0°F | Resp 20 | Ht 72.0 in | Wt 208.9 lb

## 2014-01-30 DIAGNOSIS — R0602 Shortness of breath: Secondary | ICD-10-CM | POA: Diagnosis not present

## 2014-01-30 DIAGNOSIS — C3490 Malignant neoplasm of unspecified part of unspecified bronchus or lung: Secondary | ICD-10-CM | POA: Insufficient documentation

## 2014-01-30 DIAGNOSIS — Z950 Presence of cardiac pacemaker: Secondary | ICD-10-CM | POA: Insufficient documentation

## 2014-01-30 DIAGNOSIS — I495 Sick sinus syndrome: Secondary | ICD-10-CM | POA: Insufficient documentation

## 2014-01-30 DIAGNOSIS — I252 Old myocardial infarction: Secondary | ICD-10-CM | POA: Insufficient documentation

## 2014-01-30 DIAGNOSIS — C3410 Malignant neoplasm of upper lobe, unspecified bronchus or lung: Secondary | ICD-10-CM

## 2014-01-30 DIAGNOSIS — Z9861 Coronary angioplasty status: Secondary | ICD-10-CM | POA: Diagnosis not present

## 2014-01-30 DIAGNOSIS — R05 Cough: Secondary | ICD-10-CM | POA: Diagnosis not present

## 2014-01-30 DIAGNOSIS — IMO0001 Reserved for inherently not codable concepts without codable children: Secondary | ICD-10-CM | POA: Insufficient documentation

## 2014-01-30 DIAGNOSIS — C7951 Secondary malignant neoplasm of bone: Secondary | ICD-10-CM

## 2014-01-30 DIAGNOSIS — Z87891 Personal history of nicotine dependence: Secondary | ICD-10-CM | POA: Insufficient documentation

## 2014-01-30 DIAGNOSIS — R062 Wheezing: Secondary | ICD-10-CM | POA: Insufficient documentation

## 2014-01-30 DIAGNOSIS — Z8546 Personal history of malignant neoplasm of prostate: Secondary | ICD-10-CM | POA: Insufficient documentation

## 2014-01-30 DIAGNOSIS — R059 Cough, unspecified: Secondary | ICD-10-CM | POA: Insufficient documentation

## 2014-01-30 DIAGNOSIS — C7952 Secondary malignant neoplasm of bone marrow: Secondary | ICD-10-CM

## 2014-01-30 DIAGNOSIS — I714 Abdominal aortic aneurysm, without rupture, unspecified: Secondary | ICD-10-CM | POA: Insufficient documentation

## 2014-01-30 DIAGNOSIS — I2510 Atherosclerotic heart disease of native coronary artery without angina pectoris: Secondary | ICD-10-CM | POA: Diagnosis not present

## 2014-01-30 DIAGNOSIS — I10 Essential (primary) hypertension: Secondary | ICD-10-CM | POA: Insufficient documentation

## 2014-01-30 DIAGNOSIS — C3491 Malignant neoplasm of unspecified part of right bronchus or lung: Secondary | ICD-10-CM

## 2014-01-30 DIAGNOSIS — C7970 Secondary malignant neoplasm of unspecified adrenal gland: Secondary | ICD-10-CM

## 2014-01-30 LAB — CBC WITH DIFFERENTIAL/PLATELET
BASO%: 0.5 % (ref 0.0–2.0)
Basophils Absolute: 0 10*3/uL (ref 0.0–0.1)
EOS%: 5.1 % (ref 0.0–7.0)
Eosinophils Absolute: 0.3 10*3/uL (ref 0.0–0.5)
HCT: 35.1 % — ABNORMAL LOW (ref 38.4–49.9)
HGB: 11.6 g/dL — ABNORMAL LOW (ref 13.0–17.1)
LYMPH%: 23 % (ref 14.0–49.0)
MCH: 28.5 pg (ref 27.2–33.4)
MCHC: 33 g/dL (ref 32.0–36.0)
MCV: 86.6 fL (ref 79.3–98.0)
MONO#: 0.8 10*3/uL (ref 0.1–0.9)
MONO%: 12.1 % (ref 0.0–14.0)
NEUT#: 3.9 10*3/uL (ref 1.5–6.5)
NEUT%: 59.3 % (ref 39.0–75.0)
Platelets: 226 10*3/uL (ref 140–400)
RBC: 4.05 10*6/uL — ABNORMAL LOW (ref 4.20–5.82)
RDW: 15.4 % — ABNORMAL HIGH (ref 11.0–14.6)
WBC: 6.6 10*3/uL (ref 4.0–10.3)
lymph#: 1.5 10*3/uL (ref 0.9–3.3)

## 2014-01-30 MED ORDER — DEXAMETHASONE 4 MG PO TABS: ORAL_TABLET | ORAL | Status: DC

## 2014-01-30 MED ORDER — PROCHLORPERAZINE MALEATE 10 MG PO TABS: 10.0000 mg | ORAL_TABLET | Freq: Four times a day (QID) | ORAL | Status: DC | PRN

## 2014-01-30 MED ORDER — CYANOCOBALAMIN 1000 MCG/ML IJ SOLN
1000.0000 ug | Freq: Once | INTRAMUSCULAR | Status: AC
  Administered 2014-01-30: 1000 ug via INTRAMUSCULAR

## 2014-01-30 MED ORDER — FOLIC ACID 1 MG PO TABS: 1.0000 mg | ORAL_TABLET | Freq: Every day | ORAL | Status: DC

## 2014-01-30 NOTE — [facility] Oncology Navigator Note (Signed)
Late entry:  01/29/14 - I called patient with information and time for [facility] appointment on 01/30/14.  Patient to arrive at 3:00 PM. Patient verbalized understanding.

## 2014-01-30 NOTE — Progress Notes (Signed)
[place] [phone]   [phone] Multidisciplinary thoracic oncology clinic ([place])  CONSULT NOTE  REFERRING PHYSICIAN: Dr. Modesto Charon  REASON FOR CONSULTATION:  78 years old white male recently diagnosed with lung cancer.  HPI DELYLE Schmitt is a 78 y.o. male with a past medical history significant for coronary artery disease status post myocardial infarction in 1995, history of prostate cancer with Gleason score 7 status post prostatectomy with seed implants and currently followed by Dr. Diona Fanti, history of hypertension, dyslipidemia, chronic renal insufficiency, abdominal aortic aneurysm as well as history of skin cancer. The patient also has a long history of heavy smoking but quit in 1995. In late July of 2015 the patient started complaining of right-sided back and shoulder blade pain with shortness of breath as well as cough and wheezes and one episode of hemoptysis. He was seen by his primary care physician and chest x-ray was performed on 01/08/2014. It did not show any significant abnormalities and CT angiogram scan of the chest was performed on 01/09/2014 and it showed irregular mass in the posterior segment of the right upper lobe  measuring 1.7 x 1.2 cm. There is a smaller mass in the posterior segment of the right upper lobe near the apex measuring 5 x 4 mm. There is patchy infiltrate in medial inferior aspect of the right middle lobe. 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 There is an enlarged anterior mediastinal lymph node measuring 2.5 by 2.3 cm. There is adenopathy in the right mediastinum just anterior to the carina measuring 2.7 x 2.4 cm. There is right hilar adenopathy measuring 3.0 by 2.3 cm. There is a mildly prominent sub- carinal lymph node measuring 2.2 x 1.3 cm. he was referred to Dr. Roxan Hockey and a PET scan was performed on  01/17/2014. It showed mildly hypermetabolic spiculated nodule in the posterior right upper lobe suspicious for primary bronchogenic neoplasm. There was also extensive thoracic adenopathy including a hypermetabolic right supraclavicular node and suspected right adrenal metastases in addition to osseous metastasis involving the right L5 vertebral body and right sacrum. CT scan of the head on 01/29/2014 showed no acute or metastatic intracranial abnormality.  On 01/28/2014, the patient underwent mediastinoscopy under the care of Dr. Roxan Hockey with biopsy of the 4R lymph node.  The final pathology (Accession: [id number]) showed metastatic adenocarcinoma. The adenocarcinoma demonstrates the following immunophenotype:Cytokeratin 7 - strong diffuse expression. Cytokeratin 20 - negative expression. CK5/6 - patchy moderate to strong expression. CDX2 - negative expression. TTF1 - negative expression. PSA - negative expression. The immunophenotype excludes metastatic prostatic adenocarcinoma with focal squamous differentiation. Given the CT findings, the immunoprofile is supportive of metastatic primary pulmonary adenocarcinoma. In lieu of further immunophenotyping, tissue will be reserved for ancillary tumor testing. Dr. Roxan Hockey kindly referred the patient to me today for further evaluation and recommendation regarding treatment of his condition. The patient continues to complain of pain in the right shoulder blade as well as fatigue and shortness breath with exertion. He also has cough productive of yellowish sputum. He lost around 11 pounds in the last 2 months. Family history significant for a father who died at age 41 with pulmonary embolism and mother died at age 33 with Alzheimer.  The patient is single and was accompanied by his girlfriend Nicholas Schmitt. He has 3 children. He used to work in a Writer. He has a history of smoking 3 packs per day  for around 3 years but quit in 1995. He has no history of  alcohol or drug abuse.   HPI  Past Medical History  Diagnosis Date  . Hypertension   . CAD (coronary artery disease)     positive stress test in 2006 led to left heart cath (8/06) showing 95% prox RCA, 90% CFX, and 90% mLAD.  patient had a cypher DES to all 3 lesions  . Hyperlipidemia   . Hearing loss   . AAA (abdominal aortic aneurysm) 11/09    3.2 cm   . Chronic renal insufficiency   . History of radiation therapy 03/08/04- 04/08/04    prostate 4600 cGy in 3 fractions, radioactive seed implant 05/04/04  . Myocardial infarction 1995  . Pacemaker 2014  . Dysrhythmia   . Shortness of breath     exertion  . Asthma     bronchial  . COPD (chronic obstructive pulmonary disease)   . Cancer 2015    prostate   . Prostate cancer 2005    gleason 7  . Skin cancer     ear    Past Surgical History  Procedure Laterality Date  . Ptca  2006  . Coronary stent placement    . Permanent pacemaker insertion Bilateral 02/26/13    MDT Adapta L implanted by Dr Rayann Heman for sick sinus syndrome  . Radioactive seed implant  05/04/2004    8000 cGy, Dr Danny Lawless Dr Reece Agar  . Elbow surgery Bilateral 1973  . Insert / replace / remove pacemaker    . Coronary angioplasty    . Mediastinoscopy N/A 01/28/2014    Procedure: MEDIASTINOSCOPY;  Surgeon: Melrose Nakayama, MD;  Location: [facility] OR;  Service: Thoracic;  Laterality: N/A;    Family History  Problem Relation Age of Onset  . Asthma Sister   . Asthma Brother   . Coronary artery disease Other     Social History History  Substance Use Topics  . Smoking status: Former Smoker -- 0.50 packs/day    Quit date: 07/30/1993  . Smokeless tobacco: Not on file  . Alcohol Use: No     Comment: hx alcohol abuse per note dated 04/11/11    No Known Allergies  Current Outpatient Prescriptions  Medication Sig Dispense Refill  . albuterol (PROVENTIL,VENTOLIN) 90 MCG/ACT inhaler Inhale 2 puffs into the lungs every 6 (six) hours as needed.  17 g  3  .  amLODipine (NORVASC) 10 MG tablet Take 1 tablet (10 mg total) by mouth daily.  100 tablet  3  . atorvastatin (LIPITOR) 40 MG tablet Take 1 tablet (40 mg total) by mouth daily.  100 tablet  3  . budesonide-formoterol (SYMBICORT) 160-4.5 MCG/ACT inhaler Inhale 1 puff into the lungs as needed (shortness of breath).  4 Inhaler  3  . clopidogrel (PLAVIX) 75 MG tablet Take 75 mg by mouth daily. HOLD      . losartan (COZAAR) 100 MG tablet Take 1 tablet (100 mg total) by mouth daily.  100 tablet  3  . Multiple Vitamin (MULTIVITAMIN) tablet Take 1 tablet by mouth daily. HOLD      . naproxen sodium (ANAPROX) 220 MG tablet Take 220 mg by mouth as needed.      [name] oxyCODONE (OXY IR/ROXICODONE) 5 MG immediate release tablet Take 1-2 tablets (5-10 mg total) by mouth every 4 (four) hours as needed for moderate pain.  30 tablet  0  . dexamethasone (DECADRON) 4 MG tablet 4 mg by mouth twice a day the day  before, day of and day after the chemotherapy every 3 weeks.  40 tablet  1  . folic acid (FOLVITE) 1 MG tablet Take 1 tablet (1 mg total) by mouth daily.  30 tablet  3  . nitroGLYCERIN (NITROSTAT) 0.4 MG SL tablet Place 1 tablet (0.4 mg total) under the tongue every 5 (five) minutes as needed.  25 tablet  1  . prochlorperazine (COMPAZINE) 10 MG tablet Take 1 tablet (10 mg total) by mouth every 6 (six) hours as needed for nausea or vomiting.  30 tablet  0   No current facility-administered medications for this visit.    Review of Systems  Constitutional: positive for fatigue and weight loss Eyes: negative Ears, nose, mouth, throat, and face: negative Respiratory: positive for cough, dyspnea on exertion and sputum Cardiovascular: negative Gastrointestinal: negative Genitourinary:negative Integument/breast: negative Hematologic/lymphatic: negative Musculoskeletal:positive for back pain and muscle weakness Neurological: negative Behavioral/Psych: negative Endocrine: negative Allergic/Immunologic:  negative  Physical Exam  ZCH:YIFOY, healthy, no distress, well nourished and well developed SKIN: skin color, texture, turgor are normal, no rashes or significant lesions HEAD: Normocephalic, No masses, lesions, tenderness or abnormalities EYES: normal, PERRLA EARS: External ears normal, Canals clear OROPHARYNX:no exudate, no erythema and lips, buccal mucosa, and tongue normal  NECK: supple, no adenopathy, no JVD LYMPH:  no palpable lymphadenopathy, no hepatosplenomegaly LUNGS: clear to auscultation , and palpation HEART: regular rate & rhythm, no murmurs and no gallops ABDOMEN:abdomen soft, non-tender, normal bowel sounds and no masses or organomegaly BACK: Back symmetric, no curvature., No CVA tenderness EXTREMITIES:no joint deformities, effusion, or inflammation, no edema, no skin discoloration, no clubbing  NEURO: alert & oriented x 3 with fluent speech, no focal motor/sensory deficits  PERFORMANCE STATUS: ECOG 1  LABORATORY DATA: Lab Results  Component Value Date   WBC 6.6 01/30/2014   HGB 11.6* 01/30/2014   HCT 35.1* 01/30/2014   MCV 86.6 01/30/2014   PLT 226 01/30/2014      Chemistry      Component Value Date/Time   NA 143 01/24/2014 1543   K 4.1 01/24/2014 1543   CL 107 01/24/2014 1543   CO2 21 01/24/2014 1543   BUN 15 01/24/2014 1543   CREATININE 0.80 01/24/2014 1543      Component Value Date/Time   CALCIUM 9.1 01/24/2014 1543   ALKPHOS 103 01/24/2014 1543   AST 17 01/24/2014 1543   ALT 17 01/24/2014 1543   BILITOT 0.6 01/24/2014 1543       RADIOGRAPHIC STUDIES: Chest 2 View  01/24/2014   CLINICAL DATA:  Preop for thyroid surgery, also history of prostate carcinoma  EXAM: CHEST  2 VIEW  COMPARISON:  Chest x-ray of 01/07/2014 and CT chest of 01/09/2014  FINDINGS: The questioned nodular lesion in the right lung apex by recent CT of the chest is not well seen by plain film, possibly overlying the medial right clavicle. No focal infiltrate or effusion is seen. Mild  cardiomegaly is stable with dual lead permanent pacemaker present. No acute bony abnormality is seen.  IMPRESSION: No active lung disease. Stable mild cardiomegaly with permanent pacemaker.   Electronically Signed   By: Ivar Drape M.D.   On: 01/24/2014 15:45   Dg Chest 2 View  01/07/2014   CLINICAL DATA:  Hemoptysis.  EXAM: CHEST  2 VIEW  COMPARISON:  02/27/2013 .  FINDINGS: Mediastinum and hilar structures are normal. Cardiac pacer noted with lead tips in the right atrium and right ventricle. Heart size normal with normal pulmonary vascularity. No  pleural effusion or pneumothorax. Degenerative changes thoracic spine.  IMPRESSION: 1. Basilar pleural parenchymal thickening consistent atelectasis and/or scarring. 2. Cardiac pacer noted with lead tips in the right atrium and right ventricle.   Electronically Signed   By: Marcello Moores  Register   On: 01/07/2014 13:20   Ct Head W Wo Contrast  01/29/2014   CLINICAL DATA:  77 year old male with stage IV cancer. Staging. Pacemaker. Subsequent encounter.  EXAM: CT HEAD WITHOUT AND WITH CONTRAST  TECHNIQUE: Contiguous axial images were obtained from the base of the skull through the vertex without and with intravenous contrast  CONTRAST:  75 mL Isovue-300.  COMPARISON:  PET-CT 01/17/2014.  Face CT 08/21/2010.  FINDINGS: No acute or suspicious bone lesion of the calvarium identified. Visualized paranasal sinuses and mastoids are clear. Visualized orbit soft tissues are within normal limits. Visualized scalp soft tissues are within normal limits.  Chronic bulky dural calcifications incidentally noted, including along the tentorium greater on the right. No ventriculomegaly. No acute intracranial hemorrhage identified. No midline shift, mass effect, or evidence of intracranial mass lesion. Mild for age nonspecific white matter hypodensity. No evidence of cortically based acute infarction identified. Calcified atherosclerosis at the skull base. No abnormal enhancement identified.  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  IMPRESSION: No acute or metastatic intracranial abnormality.   Electronically Signed   By: Lars Pinks M.D.   On: 01/29/2014 15:55   Ct Angio Chest W/cm &/or Wo Cm  01/09/2014   CLINICAL DATA:  Hemoptysis  EXAM: CT ANGIOGRAPHY CHEST WITH CONTRAST  TECHNIQUE: Multidetector CT imaging of the chest was performed using the standard protocol during bolus administration of intravenous contrast. Multiplanar CT image reconstructions and MIPs were obtained to evaluate the vascular anatomy.  CONTRAST:  19mL OMNIPAQUE IOHEXOL 350 MG/ML SOLN  COMPARISON:  Chest CT July 22, 2010 and chest radiograph January 07, 2014  FINDINGS: There is no demonstrable pulmonary embolus. There is atherosclerotic change in the aorta but no aneurysm or dissection appreciable.  There is underlying centrilobular emphysema. There is a somewhat irregular mass in the posterior segment of the right upper lobe measuring 1.7 x 1.2 cm, best seen on slice 12 series 6. There is a smaller mass in the posterior segment of the right upper lobe near the apex measuring 5 x 4 mm seen on slice 9 series 6. There is patchy infiltrate in medial inferior aspect of the right middle lobe. 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  There is an enlarged anterior mediastinal lymph node measuring 2.5 by 2.3 cm. There is adenopathy in the right mediastinum just anterior to the carina measuring 2.7 x 2.4 cm. There is right hilar adenopathy measuring 3.0 by 2.3 cm. There is a mildly prominent sub- carinal lymph node measuring 2.2 x 1.3 cm.  There is a small hiatal hernia with thickening of the esophagus throughout much of its course.  The pericardium is not thickened. There are multiple foci of coronary artery calcification.  There are small cysts in the liver, stable from prior study. Incomplete  visualization of upper pole left renal cysts is again noted. There is a mass in the right adrenal gland which was not present previously measuring 2.1 x 1.3 cm.  There are no blastic or lytic bone lesions apparent. Pacemaker leads are attached to the right atrium and right ventricle. Thyroid appears unremarkable.  Review of the MIP images confirms the above findings.  IMPRESSION: No demonstrable pulmonary embolus.  Irregular nodular lesion in the right upper lobe posteriorly suspicious for neoplasm. There is adenopathy at multiple sites. There is also a new small right adrenal mass. Neoplastic etiology for these findings is certainly suspected. These findings may warrant correlation with nuclear medicine PET study to further assess.  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 There is underlying emphysematous change.  Small hiatal hernia. Somewhat diffuse esophageal thickening raising concern for chronic reflux esophagitis.  These results will be called to the ordering clinician or representative by the Radiologist Assistant, and communication documented in the PACS or zVision Dashboard.   Electronically Signed   By: Lowella Grip M.D.   On: 01/09/2014 15:22   Nm Pet Image Initial (pi) Skull Base To Thigh  01/17/2014   ADDENDUM REPORT: 01/17/2014 14:38  ADDENDUM: The following was unintentionally omitted from the original report:  Mild inflammatory changes along a prominent sigmoid diverticulum in the left pelvis (series 4/image 182), with associated mild hypermetabolism (PET image 178), correlate for signs/symptoms of mild sigmoid diverticulitis.   Electronically Signed   By: Julian Hy M.D.   On: 01/17/2014 14:38   01/17/2014   CLINICAL DATA:  Initial treatment strategy for lung nodule. History of prostate cancer with brachytherapy seeds.  EXAM: NUCLEAR MEDICINE PET SKULL BASE TO THIGH  TECHNIQUE: 13.0 mCi F-18 FDG was injected intravenously. Full-ring PET imaging was  performed from the skull base to thigh after the radiotracer. CT data was obtained and used for attenuation correction and anatomic localization.  FASTING BLOOD GLUCOSE:  Value: 112 mg/dl  COMPARISON:  CT chest dated 01/09/2014. Bone scan dated 07/02/2013. CT abdomen pelvis dated 07/03/2011.  FINDINGS: NECK  6 mm short axis high right supraclavicular node (series 4/ image 45), max SUV 4.3.  CHEST  14 x 12 mm spiculated posterior right upper lobe nodule (series 8/image 17), corresponding to the CT abnormality, max SUV 1.6. While technically indeterminate, given the associated findings, this appearance is worrisome for primary bronchogenic neoplasm.  Evaluation of lung parenchyma is constrained by respiratory motion. Underlying emphysematous changes. No pleural effusion or pneumothorax.  Widespread thoracic lymphadenopathy, including:  --10 mm short axis right supraclavicular/level 7 node series 4/ image 50), max SUV 14.1  --8 mm short axis node at the left thoracic inlet (series 4/ image 49), max SUV 11.8  --8 mm short axis high right paratracheal node (series 4/image 58), max SUV 13.4  --1.7 cm short axis right prevascular node (series 4/ image 66), max SUV 13.9  --1.8 cm short axis right paratracheal node (series 4/image 56), max SUV 13.4  --1.8 cm short axis right hilar node (series 4/ image 71), max SUV 15.8  The heart is normal in size. Left subclavian pacemaker. Coronary atherosclerosis.  ABDOMEN/PELVIS  1.6 x 1.1 cm right adrenal nodule, max SUV 8.7, suspicious for metastasis.  No abnormal hypermetabolic activity within the liver, pancreas, or spleen.  No hypermetabolic lymph nodes in the abdomen or pelvis.  Brachytherapy seeds in the prostate.  Small fat containing left inguinal hernia. Vascular calcifications. 3.1 x 3.4 cm infrarenal abdominal aortic aneurysm.  SKELETON  18 mm lytic lesion in the right L5 vertebral body, max SUV 10.5. Additional lytic lesion in the right sacrum (series 4/ image 164), max SUV  8.8.  IMPRESSION: Mildly hypermetabolic spiculated nodule in the posterior right upper lobe, suspicious for primary bronchogenic neoplasm.  Extensive thoracic lymphadenopathy, including a hypermetabolic right supraclavicular node. Consider percutaneous biopsy of this node for  tissue confirmation.  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  Osseous metastases involving the right L5 vertebral body and right sacrum.  Electronically Signed: By: Julian Hy M.D. On: 01/17/2014 09:55    ASSESSMENT: This is a very pleasant 78 years old white male recently diagnosed with a stage IV (T1a, N3, M1b) non-small cell lung cancer, adenocarcinoma presented with right upper lobe lung nodule in addition to mediastinal and right supraclavicular lymphadenopathy as well as right adrenal and bone metastasis diagnosed in August of 2015.   PLAN: I had a lengthy discussion with the patient and his girlfriend today about his current disease is stage, prognosis and treatment options. I explained to the patient that he has incurable condition and the treatment would be only palliative nature. I discussed with him the treatment options including palliative care and hospice referral versus consideration of systemic chemotherapy with carboplatin for AUC of 5 and Alimta 500 mg/M2 given every 3 weeks. I discussed with the patient adverse effect of the chemotherapy including but not limited to alopecia, myelosuppression, nausea and vomiting, peripheral neuropathy, liver or renal dysfunction. The patient would like to proceed with the treatment as planned then he gives a verbal consent for the treatment. He is expected to start the first cycle of this treatment next week. I will arrange for the patient to have a chemotherapy education class for starting the first cycle of his chemotherapy. He will also receive vitamin B12 injection today. I will call his pharmacy with prescription for Compazine 10 mg by mouth every 6 hours as needed for  nausea, Decadron 4 mg by mouth twice a day the day before, day of and day after the chemotherapy in addition to folic acid 1 mg by mouth daily. The tissue block was also requested to be sent to [facility] for molecular studies but this may take up to 2 weeks for the results to be available. I gave the patient and his girlfriend the time to ask questions and answers and completed to their satisfaction. The patient was seen during his multidisciplinary thoracic oncology clinic today by medical oncology, radiation oncology, physical therapist as well as thoracic navigator. He was advised to call immediately if he has any concerning symptoms in the interval.  The patient voices understanding of current disease status and treatment options and is in agreement with the current care plan.  All questions were answered. The patient knows to call the clinic with any problems, questions or concerns. We can certainly see the patient much sooner if necessary.  Thank you so much for allowing me to participate in the care of Nicholas Schmitt. I will continue to follow up the patient with you and assist in his care.  I spent 55 minutes counseling the patient face to face. The total time spent in the appointment was 80 minutes.  Disclaimer: This note was dictated with voice recognition software. Similar sounding words can inadvertently be transcribed and may not be corrected upon review.   Taja Pentland K. 01/30/2014, 4:31 PM

## 2014-01-30 NOTE — Progress Notes (Signed)
Radiation Oncology         ([phone]) [phone] ________________________________  Initial outpatient Consultation  Name: Nicholas Schmitt MRN: [id number]  Date: 01/30/2014  DOB: 12-15-1935  WU:JWJX,BJYNWGN ALLEN, MD  Melrose Nakayama, *   REFERRING PHYSICIAN: Melrose Nakayama, *  DIAGNOSIS: The encounter diagnosis was Non-small cell carcinoma of lung, stage 4, right.  HISTORY OF PRESENT ILLNESS::Nicholas Schmitt is a 78 y.o. male who is seen out courtesy of Dr. Roxan Hockey as part of the multidisciplinary thoracic clinic. He is a gentleman with a history of coronary disease and prostate cancer who presented Dr. Elease Hashimoto with a complaint of right-sided back pain under the shoulder blade and shortness of breath. Patient is also noticed a cough as well as 1 episode of hemoptysis. The patient also became more short of breath with walking developed decreased energy and a poor appetite.  A chest CT scan was performed which revealed a irregular nodular lesion in the right upper lobe posteriorly suspicious for neoplasm. Patient was also noted to have med. adenopathy and a new small right adrenal mass.  He PET scan showed hypermetabolic activity within the right upper lobe nodule as well as extensive thoracic lymphadenopathy right supraclavicular lymph node. In addition there was adrenal metastasis and metastasis involving the right L5 vertebral body and right sacrum. On August 18 patient was taken to the operating room by Dr. Roxan Hockey at which time the patient underwent mediastinoscopy. Tissue from this procedure revealed metastatic adenocarcinoma most likely a pulmonary primary (patient has a prior history of prostate cancer). This information the patient is now seen in the multidisciplinary clinic   PREVIOUS RADIATION THERAPY: Yes related to the patient's prostate cancer. He received external beam radiation therapy and a radioactive seed implant as a boost. This was performed under the direction  of [name] and Dr. Nelida Gores  PAST MEDICAL HISTORY:  has a past medical history of Hypertension; CAD (coronary artery disease); Hyperlipidemia; Hearing loss; AAA (abdominal aortic aneurysm) (11/09); Chronic renal insufficiency; History of radiation therapy (03/08/04- 04/08/04); Myocardial infarction (1995); Pacemaker (2014); Dysrhythmia; Shortness of breath; Asthma; COPD (chronic obstructive pulmonary disease); Cancer (2015); Prostate cancer (2005); and Skin cancer.    PAST SURGICAL HISTORY: Past Surgical History  Procedure Laterality Date  . Ptca  2006  . Coronary stent placement    . Permanent pacemaker insertion Bilateral 02/26/13    MDT Adapta L implanted by Dr Rayann Heman for sick sinus syndrome  . Radioactive seed implant  05/04/2004    8000 cGy, Dr Danny Lawless Dr Reece Agar  . Elbow surgery Bilateral 1973  . Insert / replace / remove pacemaker    . Coronary angioplasty    . Mediastinoscopy N/A 01/28/2014    Procedure: MEDIASTINOSCOPY;  Surgeon: Melrose Nakayama, MD;  Location: [facility] OR;  Service: Thoracic;  Laterality: N/A;    FAMILY HISTORY: family history includes Asthma in his brother and sister; Coronary artery disease in his other.  SOCIAL HISTORY:  reports that he quit smoking about 20 years ago. He does not have any smokeless tobacco history on file. He reports that he does not drink alcohol or use illicit drugs.  ALLERGIES: Review of patient's allergies indicates no known allergies.  MEDICATIONS:  Current Outpatient Prescriptions  Medication Sig Dispense Refill  . albuterol (PROVENTIL,VENTOLIN) 90 MCG/ACT inhaler Inhale 2 puffs into the lungs every 6 (six) hours as needed.  17 g  3  . amLODipine (NORVASC) 10 MG tablet Take 1 tablet (10 mg total) by mouth daily.  100 tablet  3  . atorvastatin (LIPITOR) 40 MG tablet Take 1 tablet (40 mg total) by mouth daily.  100 tablet  3  . budesonide-formoterol (SYMBICORT) 160-4.5 MCG/ACT inhaler Inhale 1 puff into the lungs as  needed (shortness of breath).  4 Inhaler  3  . clopidogrel (PLAVIX) 75 MG tablet Take 75 mg by mouth daily. HOLD      . dexamethasone (DECADRON) 4 MG tablet 4 mg by mouth twice a day the day before, day of and day after the chemotherapy every 3 weeks.  40 tablet  1  . folic acid (FOLVITE) 1 MG tablet Take 1 tablet (1 mg total) by mouth daily.  30 tablet  3  . losartan (COZAAR) 100 MG tablet Take 1 tablet (100 mg total) by mouth daily.  100 tablet  3  . Multiple Vitamin (MULTIVITAMIN) tablet Take 1 tablet by mouth daily. HOLD      . naproxen sodium (ANAPROX) 220 MG tablet Take 220 mg by mouth as needed.      . nitroGLYCERIN (NITROSTAT) 0.4 MG SL tablet Place 1 tablet (0.4 mg total) under the tongue every 5 (five) minutes as needed.  25 tablet  1  . oxyCODONE (OXY IR/ROXICODONE) 5 MG immediate release tablet Take 1-2 tablets (5-10 mg total) by mouth every 4 (four) hours as needed for moderate pain.  30 tablet  0  . prochlorperazine (COMPAZINE) 10 MG tablet Take 1 tablet (10 mg total) by mouth every 6 (six) hours as needed for nausea or vomiting.  30 tablet  0   No current facility-administered medications for this encounter.    REVIEW OF SYSTEMS:  A 15 point review of systems is documented in the electronic medical record. This was obtained by the nursing staff. However, I reviewed this with the patient to discuss relevant findings and make appropriate changes.  Symptoms are documented in the H&P high as above. He denies any headaches or visual problems. Patient denies any significant pain in his lower back or pelvis region.   PHYSICAL EXAM: Vitals - 1 value per visit 12/17/2374  SYSTOLIC 283  DIASTOLIC 68  Pulse 79  Temperature 98  Respirations 20  Weight (lb) 208.9  Height 6\' 0"   BMI 28.33  VISIT REPORT     In Gen. this is a very pleasant 78 year old gentleman in no acute distress. He is somewhat hard of hearing. Examination of the pupils reveals him to be equal round and reactive to  light. Extraocular eye movements are intact. The tongue is midline. No secondary infection noted in the oral cavity or posterior pharynx. Examination of the neck and supraclavicular region reveals no palpable adenopathy. The axillary areas are free of adenopathy. Examination of the lungs reveals them to be clear. The heart has a regular rhythm and rate. The patient has a pacemaker in place in the left upper chest. The abdomen is soft and nontender with normal bowel sounds.  On neurological examination motor strength is 5 out of 5 in the proximal and distal muscle groups the upper lower extremities. Peripheral pulses are good. No significant edema in the extremities.  ECOG = 1    1 - Symptomatic but completely ambulatory (Restricted in physically strenuous activity but ambulatory and able to carry out work of a light or sedentary nature. For example, light housework, office work)  LABORATORY DATA:  Lab Results  Component Value Date   WBC 6.6 01/30/2014   HGB 11.6* 01/30/2014   HCT 35.1* 01/30/2014  MCV 86.6 01/30/2014   PLT 226 01/30/2014   NEUTROABS 3.9 01/30/2014   Lab Results  Component Value Date   NA 143 01/24/2014   K 4.1 01/24/2014   CL 107 01/24/2014   CO2 21 01/24/2014   GLUCOSE 118* 01/24/2014   CREATININE 0.80 01/24/2014   CALCIUM 9.1 01/24/2014      RADIOGRAPHY: Chest 2 View  01/24/2014   CLINICAL DATA:  Preop for thyroid surgery, also history of prostate carcinoma  EXAM: CHEST  2 VIEW  COMPARISON:  Chest x-ray of 01/07/2014 and CT chest of 01/09/2014  FINDINGS: The questioned nodular lesion in the right lung apex by recent CT of the chest is not well seen by plain film, possibly overlying the medial right clavicle. No focal infiltrate or effusion is seen. Mild cardiomegaly is stable with dual lead permanent pacemaker present. No acute bony abnormality is seen.  IMPRESSION: No active lung disease. Stable mild cardiomegaly with permanent pacemaker.   Electronically Signed   By: Ivar Drape M.D.   On: 01/24/2014 15:45   Dg Chest 2 View  01/07/2014   CLINICAL DATA:  Hemoptysis.  EXAM: CHEST  2 VIEW  COMPARISON:  02/27/2013 .  FINDINGS: Mediastinum and hilar structures are normal. Cardiac pacer noted with lead tips in the right atrium and right ventricle. Heart size normal with normal pulmonary vascularity. No pleural effusion or pneumothorax. Degenerative changes thoracic spine.  IMPRESSION: 1. Basilar pleural parenchymal thickening consistent atelectasis and/or scarring. 2. Cardiac pacer noted with lead tips in the right atrium and right ventricle.   Electronically Signed   By: Marcello Moores  Register   On: 01/07/2014 13:20   Ct Head W Wo Contrast  01/29/2014   CLINICAL DATA:  78 year old male with stage IV cancer. Staging. Pacemaker. Subsequent encounter.  EXAM: CT HEAD WITHOUT AND WITH CONTRAST  TECHNIQUE: Contiguous axial images were obtained from the base of the skull through the vertex without and with intravenous contrast  CONTRAST:  75 mL Isovue-300.  COMPARISON:  PET-CT 01/17/2014.  Face CT 08/21/2010.  FINDINGS: No acute or suspicious bone lesion of the calvarium identified. Visualized paranasal sinuses and mastoids are clear. Visualized orbit soft tissues are within normal limits. Visualized scalp soft tissues are within normal limits.  Chronic bulky dural calcifications incidentally noted, including along the tentorium greater on the right. No ventriculomegaly. No acute intracranial hemorrhage identified. No midline shift, mass effect, or evidence of intracranial mass lesion. Mild for age nonspecific white matter hypodensity. No evidence of cortically based acute infarction identified. Calcified atherosclerosis at the skull base. No abnormal enhancement identified. 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  IMPRESSION: No acute or metastatic intracranial abnormality.   Electronically Signed   By: Lars Pinks M.D.   On: 01/29/2014 15:55   Ct Angio  Chest W/cm &/or Wo Cm  01/09/2014   CLINICAL DATA:  Hemoptysis  EXAM: CT ANGIOGRAPHY CHEST WITH CONTRAST  TECHNIQUE: Multidetector CT imaging of the chest was performed using the standard protocol during bolus administration of intravenous contrast. Multiplanar CT image reconstructions and MIPs were obtained to evaluate the vascular anatomy.  CONTRAST:  173mL OMNIPAQUE IOHEXOL 350 MG/ML SOLN  COMPARISON:  Chest CT July 22, 2010 and chest radiograph January 07, 2014  FINDINGS: There is no demonstrable pulmonary embolus. There is atherosclerotic change in the aorta but no aneurysm or dissection appreciable.  There is underlying centrilobular emphysema. There is a somewhat irregular mass in the posterior segment of the right  upper lobe measuring 1.7 x 1.2 cm, best seen on slice 12 series 6. There is a smaller mass in the posterior segment of the right upper lobe near the apex measuring 5 x 4 mm seen on slice 9 series 6. There is patchy infiltrate in medial inferior aspect of the right middle lobe. 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  There is an enlarged anterior mediastinal lymph node measuring 2.5 by 2.3 cm. There is adenopathy in the right mediastinum just anterior to the carina measuring 2.7 x 2.4 cm. There is right hilar adenopathy measuring 3.0 by 2.3 cm. There is a mildly prominent sub- carinal lymph node measuring 2.2 x 1.3 cm.  There is a small hiatal hernia with thickening of the esophagus throughout much of its course.  The pericardium is not thickened. There are multiple foci of coronary artery calcification.  There are small cysts in the liver, stable from prior study. Incomplete visualization of upper pole left renal cysts is again noted. There is a mass in the right adrenal gland which was not present previously measuring 2.1 x 1.3 cm.  There are no blastic or lytic bone lesions apparent. Pacemaker leads  are attached to the right atrium and right ventricle. Thyroid appears unremarkable.  Review of the MIP images confirms the above findings.  IMPRESSION: No demonstrable pulmonary embolus.  Irregular nodular lesion in the right upper lobe posteriorly suspicious for neoplasm. There is adenopathy at multiple sites. There is also a new small right adrenal mass. Neoplastic etiology for these findings is certainly suspected. These findings may warrant correlation with nuclear medicine PET study to further assess.  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 There is underlying emphysematous change.  Small hiatal hernia. Somewhat diffuse esophageal thickening raising concern for chronic reflux esophagitis.  These results will be called to the ordering clinician or representative by the Radiologist Assistant, and communication documented in the PACS or zVision Dashboard.   Electronically Signed   By: Lowella Grip M.D.   On: 01/09/2014 15:22   Nm Pet Image Initial (pi) Skull Base To Thigh  01/17/2014   ADDENDUM REPORT: 01/17/2014 14:38  ADDENDUM: The following was unintentionally omitted from the original report:  Mild inflammatory changes along a prominent sigmoid diverticulum in the left pelvis (series 4/image 182), with associated mild hypermetabolism (PET image 178), correlate for signs/symptoms of mild sigmoid diverticulitis.   Electronically Signed   By: Julian Hy M.D.   On: 01/17/2014 14:38   01/17/2014   CLINICAL DATA:  Initial treatment strategy for lung nodule. History of prostate cancer with brachytherapy seeds.  EXAM: NUCLEAR MEDICINE PET SKULL BASE TO THIGH  TECHNIQUE: 13.0 mCi F-18 FDG was injected intravenously. Full-ring PET imaging was performed from the skull base to thigh after the radiotracer. CT data was obtained and used for attenuation correction and anatomic localization.  FASTING BLOOD GLUCOSE:  Value: 112 mg/dl  COMPARISON:  CT chest dated 01/09/2014.  Bone scan dated 07/02/2013. CT abdomen pelvis dated 07/03/2011.  FINDINGS: NECK  6 mm short axis high right supraclavicular node (series 4/ image 45), max SUV 4.3.  CHEST  14 x 12 mm spiculated posterior right upper lobe nodule (series 8/image 17), corresponding to the CT abnormality, max SUV 1.6. While technically indeterminate, given the associated findings, this appearance is worrisome for primary bronchogenic neoplasm.  Evaluation of lung parenchyma is constrained by respiratory motion. Underlying emphysematous changes. No pleural effusion or  pneumothorax.  Widespread thoracic lymphadenopathy, including:  --10 mm short axis right supraclavicular/level 7 node series 4/ image 50), max SUV 14.1  --8 mm short axis node at the left thoracic inlet (series 4/ image 49), max SUV 11.8  --8 mm short axis high right paratracheal node (series 4/image 58), max SUV 13.4  --1.7 cm short axis right prevascular node (series 4/ image 66), max SUV 13.9  --1.8 cm short axis right paratracheal node (series 4/image 56), max SUV 13.4  --1.8 cm short axis right hilar node (series 4/ image 71), max SUV 15.8  The heart is normal in size. Left subclavian pacemaker. Coronary atherosclerosis.  ABDOMEN/PELVIS  1.6 x 1.1 cm right adrenal nodule, max SUV 8.7, suspicious for metastasis.  No abnormal hypermetabolic activity within the liver, pancreas, or spleen.  No hypermetabolic lymph nodes in the abdomen or pelvis.  Brachytherapy seeds in the prostate.  Small fat containing left inguinal hernia. Vascular calcifications. 3.1 x 3.4 cm infrarenal abdominal aortic aneurysm.  SKELETON  18 mm lytic lesion in the right L5 vertebral body, max SUV 10.5. Additional lytic lesion in the right sacrum (series 4/ image 164), max SUV 8.8.  IMPRESSION: Mildly hypermetabolic spiculated nodule in the posterior right upper lobe, suspicious for primary bronchogenic neoplasm.  Extensive thoracic lymphadenopathy, including a hypermetabolic right supraclavicular  node. Consider percutaneous biopsy of this node for tissue confirmation.  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  Osseous metastases involving the right L5 vertebral body and right sacrum.  Electronically Signed: By: Julian Hy M.D. On: 01/17/2014 09:55      IMPRESSION: Stage IV non-small cell lung cancer. The patient does not have any significant endobronchial lesions or impending lung collapse to warrant treatments directed at the chest area. He does not appear to be symptomatic from his osseous metastasis in the L5 vertebral body and right sacrum area. I would recommend the patient proceed with systemic chemotherapy if he is medically fit for this treatment. Patient will be seen by medical oncology this afternoon for further evaluation concerning this issue.  PLAN: When necessary followup in radiation oncology but will consider palliative radiation therapy if the patient becomes symptomatic from his osseous metastasis.  I spent 55 minutes minutes face to face with the patient and more than 50% of that time was spent in counseling and/or coordination of care.   ------------------------------------------------  Blair Promise, PhD, MD

## 2014-01-31 ENCOUNTER — Telehealth: Payer: Self-pay | Admitting: Internal Medicine

## 2014-01-31 LAB — COMPREHENSIVE METABOLIC PANEL (CC13)
ALBUMIN: 3.5 g/dL (ref 3.5–5.0)
ALK PHOS: 104 U/L (ref 40–150)
ALT: 13 U/L (ref 0–55)
AST: 15 U/L (ref 5–34)
Anion Gap: 11 mEq/L (ref 3–11)
BUN: 17.7 mg/dL (ref 7.0–26.0)
CO2: 23 mEq/L (ref 22–29)
Calcium: 10 mg/dL (ref 8.4–10.4)
Chloride: 106 mEq/L (ref 98–109)
Creatinine: 0.9 mg/dL (ref 0.7–1.3)
Glucose: 106 mg/dl (ref 70–140)
POTASSIUM: 4.1 meq/L (ref 3.5–5.1)
SODIUM: 140 meq/L (ref 136–145)
Total Bilirubin: 0.69 mg/dL (ref 0.20–1.20)
Total Protein: 7.2 g/dL (ref 6.4–8.3)

## 2014-01-31 NOTE — Telephone Encounter (Signed)
lvm for pt regarding to Aug appt...advised pt to pick up new sched at visit

## 2014-02-06 ENCOUNTER — Other Ambulatory Visit: Payer: Medicare Other

## 2014-02-06 ENCOUNTER — Ambulatory Visit (HOSPITAL_BASED_OUTPATIENT_CLINIC_OR_DEPARTMENT_OTHER): Payer: Medicare Other

## 2014-02-06 ENCOUNTER — Other Ambulatory Visit (HOSPITAL_BASED_OUTPATIENT_CLINIC_OR_DEPARTMENT_OTHER): Payer: Medicare Other

## 2014-02-06 ENCOUNTER — Telehealth: Payer: Self-pay | Admitting: Internal Medicine

## 2014-02-06 VITALS — BP 139/96 | HR 77 | Temp 97.8°F | Resp 18

## 2014-02-06 DIAGNOSIS — Z5111 Encounter for antineoplastic chemotherapy: Secondary | ICD-10-CM

## 2014-02-06 DIAGNOSIS — C3491 Malignant neoplasm of unspecified part of right bronchus or lung: Secondary | ICD-10-CM

## 2014-02-06 DIAGNOSIS — C7970 Secondary malignant neoplasm of unspecified adrenal gland: Secondary | ICD-10-CM

## 2014-02-06 DIAGNOSIS — C3410 Malignant neoplasm of upper lobe, unspecified bronchus or lung: Secondary | ICD-10-CM

## 2014-02-06 LAB — COMPREHENSIVE METABOLIC PANEL (CC13)
ALT: 15 U/L (ref 0–55)
AST: 15 U/L (ref 5–34)
Albumin: 3.3 g/dL — ABNORMAL LOW (ref 3.5–5.0)
Alkaline Phosphatase: 100 U/L (ref 40–150)
Anion Gap: 8 mEq/L (ref 3–11)
BUN: 21.4 mg/dL (ref 7.0–26.0)
CHLORIDE: 108 meq/L (ref 98–109)
CO2: 23 mEq/L (ref 22–29)
CREATININE: 1 mg/dL (ref 0.7–1.3)
Calcium: 9.5 mg/dL (ref 8.4–10.4)
Glucose: 98 mg/dl (ref 70–140)
Potassium: 4.2 mEq/L (ref 3.5–5.1)
Sodium: 139 mEq/L (ref 136–145)
Total Bilirubin: 1.11 mg/dL (ref 0.20–1.20)
Total Protein: 7.1 g/dL (ref 6.4–8.3)

## 2014-02-06 LAB — CBC WITH DIFFERENTIAL/PLATELET
BASO%: 0.6 % (ref 0.0–2.0)
BASOS ABS: 0 10*3/uL (ref 0.0–0.1)
EOS ABS: 0.3 10*3/uL (ref 0.0–0.5)
EOS%: 4.3 % (ref 0.0–7.0)
HCT: 33.9 % — ABNORMAL LOW (ref 38.4–49.9)
HGB: 11.2 g/dL — ABNORMAL LOW (ref 13.0–17.1)
LYMPH%: 24.5 % (ref 14.0–49.0)
MCH: 28.8 pg (ref 27.2–33.4)
MCHC: 33.1 g/dL (ref 32.0–36.0)
MCV: 87 fL (ref 79.3–98.0)
MONO#: 0.7 10*3/uL (ref 0.1–0.9)
MONO%: 11 % (ref 0.0–14.0)
NEUT%: 59.6 % (ref 39.0–75.0)
NEUTROS ABS: 3.6 10*3/uL (ref 1.5–6.5)
PLATELETS: 206 10*3/uL (ref 140–400)
RBC: 3.89 10*6/uL — AB (ref 4.20–5.82)
RDW: 15.3 % — ABNORMAL HIGH (ref 11.0–14.6)
WBC: 6 10*3/uL (ref 4.0–10.3)
lymph#: 1.5 10*3/uL (ref 0.9–3.3)

## 2014-02-06 MED ORDER — ONDANSETRON 16 MG/50ML IVPB (CHCC)
16.0000 mg | Freq: Once | INTRAVENOUS | Status: AC
  Administered 2014-02-06: 16 mg via INTRAVENOUS

## 2014-02-06 MED ORDER — ONDANSETRON 16 MG/50ML IVPB (CHCC)
INTRAVENOUS | Status: AC
  Filled 2014-02-06: qty 16

## 2014-02-06 MED ORDER — SODIUM CHLORIDE 0.9 % IV SOLN
Freq: Once | INTRAVENOUS | Status: AC
  Administered 2014-02-06: 13:00:00 via INTRAVENOUS

## 2014-02-06 MED ORDER — SODIUM CHLORIDE 0.9 % IV SOLN
553.0000 mg | Freq: Once | INTRAVENOUS | Status: AC
  Administered 2014-02-06: 550 mg via INTRAVENOUS
  Filled 2014-02-06: qty 55

## 2014-02-06 MED ORDER — DEXAMETHASONE SODIUM PHOSPHATE 20 MG/5ML IJ SOLN
20.0000 mg | Freq: Once | INTRAMUSCULAR | Status: AC
  Administered 2014-02-06: 20 mg via INTRAVENOUS

## 2014-02-06 MED ORDER — DEXAMETHASONE SODIUM PHOSPHATE 20 MG/5ML IJ SOLN
INTRAMUSCULAR | Status: AC
  Filled 2014-02-06: qty 5

## 2014-02-06 MED ORDER — SODIUM CHLORIDE 0.9 % IV SOLN
500.0000 mg/m2 | Freq: Once | INTRAVENOUS | Status: AC
  Administered 2014-02-06: 1100 mg via INTRAVENOUS
  Filled 2014-02-06: qty 44

## 2014-02-06 NOTE — Patient Instructions (Addendum)
[facility] Discharge Instructions for Patients Receiving Chemotherapy  Today you received the following chemotherapy agents :  Alimta, Carboplatin.  To help prevent nausea and vomiting after your treatment, we encourage you to take your nausea medication as prescribed by your physician.  Take Compazine 10mg  by mouth every 6 hours as needed for nausea.  DO NOT Eat  Greasy  Nor  Spicy  Foods.   DO Drink lots of fluids as tolerated.   If you develop nausea and vomiting that is not controlled by your nausea medication, call the clinic.   BELOW ARE SYMPTOMS THAT SHOULD BE REPORTED IMMEDIATELY:  *FEVER GREATER THAN 100.5 F  *CHILLS WITH OR WITHOUT FEVER  NAUSEA AND VOMITING THAT IS NOT CONTROLLED WITH YOUR NAUSEA MEDICATION  *UNUSUAL SHORTNESS OF BREATH  *UNUSUAL BRUISING OR BLEEDING  TENDERNESS IN MOUTH AND THROAT WITH OR WITHOUT PRESENCE OF ULCERS  *URINARY PROBLEMS  *BOWEL PROBLEMS  UNUSUAL RASH Items with * indicate a potential emergency and should be followed up as soon as possible.  Feel free to call the clinic you have any questions or concerns. The clinic phone number is [phone].   Carboplatin injection What is this medicine? CARBOPLATIN (KAR boe pla tin) is a chemotherapy drug. It targets fast dividing cells, like cancer cells, and causes these cells to die. This medicine is used to treat ovarian cancer and many other cancers. This medicine may be used for other purposes; ask your health care provider or pharmacist if you have questions. COMMON BRAND NAME(S): Paraplatin What should I tell my health care provider before I take this medicine? They need to know if you have any of these conditions: -blood disorders -hearing problems -kidney disease -recent or ongoing radiation therapy -an unusual or allergic reaction to carboplatin, cisplatin, other chemotherapy, other medicines, foods, dyes, or preservatives -pregnant or trying to get  pregnant -breast-feeding How should I use this medicine? This drug is usually given as an infusion into a vein. It is administered in a hospital or clinic by a specially trained health care professional. Talk to your pediatrician regarding the use of this medicine in children. Special care may be needed. Overdosage: If you think you have taken too much of this medicine contact a poison control center or emergency room at once. NOTE: This medicine is only for you. Do not share this medicine with others. What if I miss a dose? It is important not to miss a dose. Call your doctor or health care professional if you are unable to keep an appointment. What may interact with this medicine? -medicines for seizures -medicines to increase blood counts like filgrastim, pegfilgrastim, sargramostim -some antibiotics like amikacin, gentamicin, neomycin, streptomycin, tobramycin -vaccines Talk to your doctor or health care professional before taking any of these medicines: -acetaminophen -aspirin -ibuprofen -ketoprofen -naproxen This list may not describe all possible interactions. Give your health care provider a list of all the medicines, herbs, non-prescription drugs, or dietary supplements you use. Also tell them if you smoke, drink alcohol, or use illegal drugs. Some items may interact with your medicine. What should I watch for while using this medicine? Your condition will be monitored carefully while you are receiving this medicine. You will need important blood work done while you are taking this medicine. This drug may make you feel generally unwell. This is not uncommon, as chemotherapy can affect healthy cells as well as cancer cells. Report any side effects. Continue your course of treatment even though you feel  ill unless your doctor tells you to stop. In some cases, you may be given additional medicines to help with side effects. Follow all directions for their use. Call your doctor or  health care professional for advice if you get a fever, chills or sore throat, or other symptoms of a cold or flu. Do not treat yourself. This drug decreases your body's ability to fight infections. Try to avoid being around people who are sick. This medicine may increase your risk to bruise or bleed. Call your doctor or health care professional if you notice any unusual bleeding. Be careful brushing and flossing your teeth or using a toothpick because you may get an infection or bleed more easily. If you have any dental work done, tell your dentist you are receiving this medicine. Avoid taking products that contain aspirin, acetaminophen, ibuprofen, naproxen, or ketoprofen unless instructed by your doctor. These medicines may hide a fever. Do not become pregnant while taking this medicine. Women should inform their doctor if they wish to become pregnant or think they might be pregnant. There is a potential for serious side effects to an unborn child. Talk to your health care professional or pharmacist for more information. Do not breast-feed an infant while taking this medicine. What side effects may I notice from receiving this medicine? Side effects that you should report to your doctor or health care professional as soon as possible: -allergic reactions like skin rash, itching or hives, swelling of the face, lips, or tongue -signs of infection - fever or chills, cough, sore throat, pain or difficulty passing urine -signs of decreased platelets or bleeding - bruising, pinpoint red spots on the skin, black, tarry stools, nosebleeds -signs of decreased red blood cells - unusually weak or tired, fainting spells, lightheadedness -breathing problems -changes in hearing -changes in vision -chest pain -high blood pressure -low blood counts - This drug may decrease the number of white blood cells, red blood cells and platelets. You may be at increased risk for infections and bleeding. -nausea and  vomiting -pain, swelling, redness or irritation at the injection site -pain, tingling, numbness in the hands or feet -problems with balance, talking, walking -trouble passing urine or change in the amount of urine Side effects that usually do not require medical attention (report to your doctor or health care professional if they continue or are bothersome): -hair loss -loss of appetite -metallic taste in the mouth or changes in taste This list may not describe all possible side effects. Call your doctor for medical advice about side effects. You may report side effects to FDA at 1-800-FDA-1088. Where should I keep my medicine? This drug is given in a hospital or clinic and will not be stored at home. NOTE: This sheet is a summary. It may not cover all possible information. If you have questions about this medicine, talk to your doctor, pharmacist, or health care provider.  2015, [employer]. (2007-09-04 14:38:05)  Pemetrexed injection What is this medicine? PEMETREXED (PEM e TREX ed) is a chemotherapy drug. This medicine affects cells that are rapidly growing, such as cancer cells and cells in your mouth and stomach. It is usually used to treat lung cancers like non-small cell lung cancer and mesothelioma. It may also be used to treat other cancers. This medicine may be used for other purposes; ask your health care provider or pharmacist if you have questions. COMMON BRAND NAME(S): Alimta What should I tell my health care provider before I take this medicine? They need  to know if you have any of these conditions: -if you frequently drink alcohol containing beverages -infection (especially a virus infection such as chickenpox, cold sores, or herpes) -kidney disease -liver disease -low blood counts, like low platelets, red bloods, or white blood cells -an unusual or allergic reaction to pemetrexed, mannitol, other medicines, foods, dyes, or preservatives -pregnant or trying to  get pregnant -breast-feeding How should I use this medicine? This drug is given as an infusion into a vein. It is administered in a hospital or clinic by a specially trained health care professional. Talk to your pediatrician regarding the use of this medicine in children. Special care may be needed. Overdosage: If you think you have taken too much of this medicine contact a poison control center or emergency room at once. NOTE: This medicine is only for you. Do not share this medicine with others. What if I miss a dose? It is important not to miss your dose. Call your doctor or health care professional if you are unable to keep an appointment. What may interact with this medicine? -aspirin and aspirin-like medicines -medicines to increase blood counts like filgrastim, pegfilgrastim, sargramostim -methotrexate -NSAIDS, medicines for pain and inflammation, like ibuprofen or naproxen -probenecid -pyrimethamine -vaccines Talk to your doctor or health care professional before taking any of these medicines: -acetaminophen -aspirin -ibuprofen -ketoprofen -naproxen This list may not describe all possible interactions. Give your health care provider a list of all the medicines, herbs, non-prescription drugs, or dietary supplements you use. Also tell them if you smoke, drink alcohol, or use illegal drugs. Some items may interact with your medicine. What should I watch for while using this medicine? Visit your doctor for checks on your progress. This drug may make you feel generally unwell. This is not uncommon, as chemotherapy can affect healthy cells as well as cancer cells. Report any side effects. Continue your course of treatment even though you feel ill unless your doctor tells you to stop. In some cases, you may be given additional medicines to help with side effects. Follow all directions for their use. Call your doctor or health care professional for advice if you get a fever, chills or  sore throat, or other symptoms of a cold or flu. Do not treat yourself. This drug decreases your body's ability to fight infections. Try to avoid being around people who are sick. This medicine may increase your risk to bruise or bleed. Call your doctor or health care professional if you notice any unusual bleeding. Be careful brushing and flossing your teeth or using a toothpick because you may get an infection or bleed more easily. If you have any dental work done, tell your dentist you are receiving this medicine. Avoid taking products that contain aspirin, acetaminophen, ibuprofen, naproxen, or ketoprofen unless instructed by your doctor. These medicines may hide a fever. Call your doctor or health care professional if you get diarrhea or mouth sores. Do not treat yourself. To protect your kidneys, drink water or other fluids as directed while you are taking this medicine. Men and women must use effective birth control while taking this medicine. You may also need to continue using effective birth control for a time after stopping this medicine. Do not become pregnant while taking this medicine. Tell your doctor right away if you think that you or your partner might be pregnant. There is a potential for serious side effects to an unborn child. Talk to your health care professional or pharmacist for more information. Do  not breast-feed an infant while taking this medicine. This medicine may lower sperm counts. What side effects may I notice from receiving this medicine? Side effects that you should report to your doctor or health care professional as soon as possible: -allergic reactions like skin rash, itching or hives, swelling of the face, lips, or tongue -low blood counts - this medicine may decrease the number of white blood cells, red blood cells and platelets. You may be at increased risk for infections and bleeding. -signs of infection - fever or chills, cough, sore throat, pain or difficulty  passing urine -signs of decreased platelets or bleeding - bruising, pinpoint red spots on the skin, black, tarry stools, blood in the urine -signs of decreased red blood cells - unusually weak or tired, fainting spells, lightheadedness -breathing problems, like a dry cough -changes in emotions or moods -chest pain -confusion -diarrhea -high blood pressure -mouth or throat sores or ulcers -pain, swelling, warmth in the leg -pain on swallowing -swelling of the ankles, feet, hands -trouble passing urine or change in the amount of urine -vomiting -yellowing of the eyes or skin Side effects that usually do not require medical attention (report to your doctor or health care professional if they continue or are bothersome): -hair loss -loss of appetite -nausea -stomach upset This list may not describe all possible side effects. Call your doctor for medical advice about side effects. You may report side effects to FDA at 1-800-FDA-1088. Where should I keep my medicine? This drug is given in a hospital or clinic and will not be stored at home. NOTE: This sheet is a summary. It may not cover all possible information. If you have questions about this medicine, talk to your doctor, pharmacist, or health care provider.  2015, [employer]. (2008-01-01 13:24:03)

## 2014-02-06 NOTE — Telephone Encounter (Signed)
Pt confirmed labs/ov per 08/27 P[facility], sent msg to add chemo, gave pt AVS....kJ

## 2014-02-07 ENCOUNTER — Telehealth: Payer: Self-pay | Admitting: Medical Oncology

## 2014-02-07 ENCOUNTER — Telehealth: Payer: Self-pay | Admitting: *Deleted

## 2014-02-07 NOTE — Telephone Encounter (Signed)
Pt tissue cannot be sent to [facility] until sept 1 because it is a medicare case. (There is a 14 day waiting period  from date of of discharge)

## 2014-02-07 NOTE — Telephone Encounter (Signed)
Called pt at home for post chemo follow up.  Pt stated he is doing fine except mild fatigue.  Slept well last night;  Good appetite and drinking lots of water as tolerated.  Denied nausea/vomiting; has constipation issue but takes laxatives with results.  Bladder functions fine.  Denied pain.  Pt aware of next returned appts.

## 2014-02-11 ENCOUNTER — Other Ambulatory Visit (HOSPITAL_COMMUNITY)
Admission: RE | Admit: 2014-02-11 | Discharge: 2014-02-11 | Disposition: A | Discharge status: Home / Self Care | Payer: Medicare Other | Source: Ambulatory Visit | Attending: Internal Medicine | Admitting: Internal Medicine

## 2014-02-11 DIAGNOSIS — C3490 Malignant neoplasm of unspecified part of unspecified bronchus or lung: Secondary | ICD-10-CM | POA: Insufficient documentation

## 2014-02-13 ENCOUNTER — Other Ambulatory Visit: Payer: Self-pay | Admitting: Medical Oncology

## 2014-02-13 ENCOUNTER — Ambulatory Visit: Payer: Medicare Other | Admitting: Physician Assistant

## 2014-02-13 ENCOUNTER — Other Ambulatory Visit: Payer: Medicare Other

## 2014-02-13 DIAGNOSIS — C3490 Malignant neoplasm of unspecified part of unspecified bronchus or lung: Secondary | ICD-10-CM

## 2014-02-14 ENCOUNTER — Other Ambulatory Visit (HOSPITAL_BASED_OUTPATIENT_CLINIC_OR_DEPARTMENT_OTHER): Payer: Medicare Other

## 2014-02-14 ENCOUNTER — Telehealth: Payer: Self-pay | Admitting: Internal Medicine

## 2014-02-14 ENCOUNTER — Encounter: Payer: Self-pay | Admitting: Physician Assistant

## 2014-02-14 ENCOUNTER — Ambulatory Visit (HOSPITAL_BASED_OUTPATIENT_CLINIC_OR_DEPARTMENT_OTHER): Payer: Medicare Other | Admitting: Physician Assistant

## 2014-02-14 VITALS — BP 122/65 | HR 78 | Temp 97.3°F | Resp 18 | Ht 72.0 in | Wt 204.2 lb

## 2014-02-14 DIAGNOSIS — Z5189 Encounter for other specified aftercare: Secondary | ICD-10-CM

## 2014-02-14 DIAGNOSIS — C3490 Malignant neoplasm of unspecified part of unspecified bronchus or lung: Secondary | ICD-10-CM

## 2014-02-14 DIAGNOSIS — C7951 Secondary malignant neoplasm of bone: Secondary | ICD-10-CM

## 2014-02-14 DIAGNOSIS — C3410 Malignant neoplasm of upper lobe, unspecified bronchus or lung: Secondary | ICD-10-CM

## 2014-02-14 DIAGNOSIS — C7970 Secondary malignant neoplasm of unspecified adrenal gland: Secondary | ICD-10-CM

## 2014-02-14 DIAGNOSIS — D701 Agranulocytosis secondary to cancer chemotherapy: Secondary | ICD-10-CM

## 2014-02-14 DIAGNOSIS — T451X5A Adverse effect of antineoplastic and immunosuppressive drugs, initial encounter: Secondary | ICD-10-CM

## 2014-02-14 DIAGNOSIS — C7952 Secondary malignant neoplasm of bone marrow: Secondary | ICD-10-CM

## 2014-02-14 LAB — CBC WITH DIFFERENTIAL/PLATELET
BASO%: 0.4 % (ref 0.0–2.0)
BASOS ABS: 0 10*3/uL (ref 0.0–0.1)
EOS%: 4.4 % (ref 0.0–7.0)
Eosinophils Absolute: 0.1 10*3/uL (ref 0.0–0.5)
HCT: 33.1 % — ABNORMAL LOW (ref 38.4–49.9)
HEMOGLOBIN: 10.7 g/dL — AB (ref 13.0–17.1)
LYMPH#: 1.3 10*3/uL (ref 0.9–3.3)
LYMPH%: 53.7 % — ABNORMAL HIGH (ref 14.0–49.0)
MCH: 28.3 pg (ref 27.2–33.4)
MCHC: 32.3 g/dL (ref 32.0–36.0)
MCV: 87.5 fL (ref 79.3–98.0)
MONO#: 0.3 10*3/uL (ref 0.1–0.9)
MONO%: 10.4 % (ref 0.0–14.0)
NEUT#: 0.8 10*3/uL — ABNORMAL LOW (ref 1.5–6.5)
NEUT%: 31.1 % — ABNORMAL LOW (ref 39.0–75.0)
Platelets: 134 10*3/uL — ABNORMAL LOW (ref 140–400)
RBC: 3.78 10*6/uL — ABNORMAL LOW (ref 4.20–5.82)
RDW: 15.1 % — ABNORMAL HIGH (ref 11.0–14.6)
WBC: 2.5 10*3/uL — ABNORMAL LOW (ref 4.0–10.3)

## 2014-02-14 LAB — COMPREHENSIVE METABOLIC PANEL (CC13)
ALBUMIN: 3.3 g/dL — AB (ref 3.5–5.0)
ALK PHOS: 89 U/L (ref 40–150)
ALT: 18 U/L (ref 0–55)
AST: 14 U/L (ref 5–34)
Anion Gap: 7 mEq/L (ref 3–11)
BUN: 33.1 mg/dL — AB (ref 7.0–26.0)
CO2: 23 mEq/L (ref 22–29)
Calcium: 9.5 mg/dL (ref 8.4–10.4)
Chloride: 110 mEq/L — ABNORMAL HIGH (ref 98–109)
Creatinine: 1.2 mg/dL (ref 0.7–1.3)
Glucose: 105 mg/dl (ref 70–140)
Potassium: 5.6 mEq/L — ABNORMAL HIGH (ref 3.5–5.1)
Sodium: 140 mEq/L (ref 136–145)
Total Bilirubin: 1.19 mg/dL (ref 0.20–1.20)
Total Protein: 6.7 g/dL (ref 6.4–8.3)

## 2014-02-14 MED ORDER — TBO-FILGRASTIM 480 MCG/0.8ML ~~LOC~~ SOSY
480.0000 ug | PREFILLED_SYRINGE | Freq: Once | SUBCUTANEOUS | Status: AC
  Administered 2014-02-14: 480 ug via SUBCUTANEOUS
  Filled 2014-02-14: qty 0.8

## 2014-02-14 NOTE — Telephone Encounter (Signed)
gv adn printed appt sched and avs for pt for Sept...sed added tx.

## 2014-02-14 NOTE — Assessment & Plan Note (Addendum)
Mr. Pontillo is a pleasant 78 year old Caucasian gentleman recently diagnosed with stage IV (T1 A., N3, M1 B.) non-small cell lung cancer adenocarcinoma presenting with a right upper lobe nodule in addition to mediastinal and right supraclavicular lymphadenopathy and right adrenal and bone metastasis diagnosed in August of 2015. He is currently being treated with systemic chemotherapy in the form of carboplatin and Alimta and is status post 1 cycle. He tolerated his first cycle relatively well with the exception of some constipation which is long-standing issue. He would like to change the date he of the week for his labs and chemotherapy as Thursdays is the day he plays golf and he would like to continue to do so. Patients weekly labs and chemotherapy appointments will be changed to Tuesdays. He will followup in 2 weeks prior to the start of cycle #2. He is slightly neutropenic today with an ANC of 0.8 and a total white count of 2.5. He is afebrile. We will give him one injection of Granix 480 mcg subcutaneously today for neutrophil support. Neutropenic cautions were discussed with patient and his wife and both voiced understanding. He'll continue with weekly labs as scheduled.

## 2014-02-14 NOTE — Patient Instructions (Addendum)

## 2014-02-14 NOTE — Progress Notes (Addendum)
No images are attached to the encounter. No scans are attached to the encounter. No scans are attached to the encounter. [facility] VISIT PROGRESS NOTE  [name], MD [place] [address]  DIAGNOSIS: Non-small cell carcinoma of lung, stage 4   Primary site: Lung (Right)   Staging method: AJCC 7th Edition   Clinical: Stage IV (T1a, N3, M1b) signed by Curt Bears, MD on 02/01/2014  1:42 PM   Summary: Stage IV (T1a, N3, M1b) Prostate cancer   Primary site: Prostate   Clinical: Stage I (T1c, NX, M0) signed by Wyatt Portela, MD on 08/06/2013  1:59 PM   Summary: Stage I (T1c, NX, M0)  PRIOR THERAPY: None  CURRENT THERAPY: Systemic chemotherapy with carboplatin for an AUC of 5 and Alimta 500 mg per meter squared given every 3 weeks. Status post 1 cycle  INTERVAL HISTORY: Nicholas Schmitt 78 y.o. male returns for a skin regular symptom management visit for followup of his recently diagnosed stage IV non-small cell lung cancer, adenocarcinoma. He completed one cycle of systemic chemotherapy with carboplatin and Alimta. Overall he tolerated the chemotherapy without difficulty. He denied any fever, chills, nausea or vomiting. He's had no diarrhea. He has had some constipation however this is been a long-standing issue for him. His appetite and energy level both remain good. He voiced no specific complaints today. He reports that he played golf yesterday. He would like to change the day of the week for his weekly labs and chemotherapy and provider appointments as Thursday appointment interfere with his golf game.  MEDICAL HISTORY: Past Medical History  Diagnosis Date  . Hypertension   . CAD (coronary artery disease)     positive stress test in 2006 led to left heart cath (8/06) showing 95% prox RCA, 90% CFX, and 90% mLAD.  patient had a cypher DES to all 3 lesions  . Hyperlipidemia   . Hearing loss   . AAA (abdominal aortic aneurysm) 11/09     3.2 cm   . Chronic renal insufficiency   . History of radiation therapy 03/08/04- 04/08/04    prostate 4600 cGy in 3 fractions, radioactive seed implant 05/04/04  . Myocardial infarction 1995  . Pacemaker 2014  . Dysrhythmia   . Shortness of breath     exertion  . Asthma     bronchial  . COPD (chronic obstructive pulmonary disease)   . Cancer 2015    prostate   . Prostate cancer 2005    gleason 7  . Skin cancer     ear    ALLERGIES:  has No Known Allergies.  MEDICATIONS:  Current Outpatient Prescriptions  Medication Sig Dispense Refill  . albuterol (PROVENTIL,VENTOLIN) 90 MCG/ACT inhaler Inhale 2 puffs into the lungs every 6 (six) hours as needed.  17 g  3  . amLODipine (NORVASC) 10 MG tablet Take 1 tablet (10 mg total) by mouth daily.  100 tablet  3  . atorvastatin (LIPITOR) 40 MG tablet Take 1 tablet (40 mg total) by mouth daily.  100 tablet  3  . budesonide-formoterol (SYMBICORT) 160-4.5 MCG/ACT inhaler Inhale 1 puff into the lungs as needed (shortness of breath).  4 Inhaler  3  . clopidogrel (PLAVIX) 75 MG tablet Take 75 mg by mouth daily. HOLD      . dexamethasone (DECADRON) 4 MG tablet 4 mg by mouth twice a day the day before, day of and day after the chemotherapy every 3 weeks.  40 tablet  1  . folic acid (FOLVITE) 1 MG tablet Take 1 tablet (1 mg total) by mouth daily.  30 tablet  3  . losartan (COZAAR) 100 MG tablet Take 1 tablet (100 mg total) by mouth daily.  100 tablet  3  . Multiple Vitamin (MULTIVITAMIN) tablet Take 1 tablet by mouth daily. HOLD      . naproxen sodium (ANAPROX) 220 MG tablet Take 220 mg by mouth as needed.      [name] oxyCODONE (OXY IR/ROXICODONE) 5 MG immediate release tablet Take 1-2 tablets (5-10 mg total) by mouth every 4 (four) hours as needed for moderate pain.  30 tablet  0  . nitroGLYCERIN (NITROSTAT) 0.4 MG SL tablet Place 1 tablet (0.4 mg total) under the tongue every 5 (five) minutes as needed.  25 tablet  1  . prochlorperazine (COMPAZINE)  10 MG tablet Take 1 tablet (10 mg total) by mouth every 6 (six) hours as needed for nausea or vomiting.  30 tablet  0   No current facility-administered medications for this visit.    SURGICAL HISTORY:  Past Surgical History  Procedure Laterality Date  . Ptca  2006  . Coronary stent placement    . Permanent pacemaker insertion Bilateral 02/26/13    MDT Adapta L implanted by Dr Rayann Heman for sick sinus syndrome  . Radioactive seed implant  05/04/2004    8000 cGy, Dr Danny Lawless Dr Reece Agar  . Elbow surgery Bilateral 1973  . Insert / replace / remove pacemaker    . Coronary angioplasty    . Mediastinoscopy N/A 01/28/2014    Procedure: MEDIASTINOSCOPY;  Surgeon: Melrose Nakayama, MD;  Location: [facility];  Service: Thoracic;  Laterality: N/A;    REVIEW OF SYSTEMS:  Review of Systems  Constitutional: Negative for fever, chills, weight loss, malaise/fatigue and diaphoresis.  HENT: Negative for congestion, ear discharge, ear pain, hearing loss, nosebleeds, sore throat and tinnitus.   Eyes: Negative for blurred vision, double vision, photophobia, pain, discharge and redness.  Respiratory: Negative for cough, hemoptysis, sputum production, shortness of breath, wheezing and stridor.   Cardiovascular: Negative for chest pain, palpitations, orthopnea, claudication, leg swelling and PND.  Gastrointestinal: Positive for constipation. Negative for heartburn, nausea, vomiting, abdominal pain, diarrhea, blood in stool and melena.  Genitourinary: Negative.   Musculoskeletal: Negative.   Skin: Negative.   Neurological: Negative for dizziness, tingling, focal weakness, seizures, weakness and headaches.  Endo/Heme/Allergies: Does not bruise/bleed easily.  Psychiatric/Behavioral: Negative for depression. The patient is not nervous/anxious and does not have insomnia.      PHYSICAL EXAMINATION: Physical Exam  Constitutional: He is oriented to person, place, and time and well-developed, well-nourished, and  in no distress.  HENT:  Head: Normocephalic and atraumatic.  Mouth/Throat: Oropharynx is clear and moist.  Eyes: Pupils are equal, round, and reactive to light.  Neck: Normal range of motion. Neck supple. No JVD present. No tracheal deviation present. No thyromegaly present.  Cardiovascular: Normal rate, regular rhythm, normal heart sounds and intact distal pulses.  Exam reveals no gallop and no friction rub.   No murmur heard. Pulmonary/Chest: Effort normal and breath sounds normal. No respiratory distress. He has no wheezes. He has no rales.  Abdominal: Soft. Bowel sounds are normal. He exhibits no distension and no mass. There is no tenderness.  Musculoskeletal: Normal range of motion. He exhibits no edema and no tenderness.  Lymphadenopathy:    He has no cervical adenopathy.  Neurological: He is alert and oriented to person, place,  and time. He has normal reflexes. Gait normal.  Skin: Skin is warm and dry. No rash noted.    ECOG PERFORMANCE STATUS: 1 - Symptomatic but completely ambulatory  Blood pressure 122/65, pulse 78, temperature 97.3 F (36.3 C), temperature source Oral, resp. rate 18, height 6' (1.829 m), weight 204 lb 3.2 oz (92.625 kg), SpO2 99.00%.  LABORATORY DATA: Lab Results  Component Value Date   WBC 2.5* 02/14/2014   HGB 10.7* 02/14/2014   HCT 33.1* 02/14/2014   MCV 87.5 02/14/2014   PLT 134* 02/14/2014      Chemistry      Component Value Date/Time   NA 140 02/14/2014 1105   NA 143 01/24/2014 1543   K 5.6* 02/14/2014 1105   K 4.1 01/24/2014 1543   CL 107 01/24/2014 1543   CO2 23 02/14/2014 1105   CO2 21 01/24/2014 1543   BUN 33.1* 02/14/2014 1105   BUN 15 01/24/2014 1543   CREATININE 1.2 02/14/2014 1105   CREATININE 0.80 01/24/2014 1543      Component Value Date/Time   CALCIUM 9.5 02/14/2014 1105   CALCIUM 9.1 01/24/2014 1543   ALKPHOS 89 02/14/2014 1105   ALKPHOS 103 01/24/2014 1543   AST 14 02/14/2014 1105   AST 17 01/24/2014 1543   ALT 18 02/14/2014 1105   ALT 17 01/24/2014  1543   BILITOT 1.19 02/14/2014 1105   BILITOT 0.6 01/24/2014 1543       RADIOGRAPHIC STUDIES:  Chest 2 View  01/24/2014   CLINICAL DATA:  Preop for thyroid surgery, also history of prostate carcinoma  EXAM: CHEST  2 VIEW  COMPARISON:  Chest x-ray of 01/07/2014 and CT chest of 01/09/2014  FINDINGS: The questioned nodular lesion in the right lung apex by recent CT of the chest is not well seen by plain film, possibly overlying the medial right clavicle. No focal infiltrate or effusion is seen. Mild cardiomegaly is stable with dual lead permanent pacemaker present. No acute bony abnormality is seen.  IMPRESSION: No active lung disease. Stable mild cardiomegaly with permanent pacemaker.   Electronically Signed   By: Ivar Drape M.D.   On: 01/24/2014 15:45   Ct Head W Wo Contrast  01/29/2014   CLINICAL DATA:  78 year old male with stage IV cancer. Staging. Pacemaker. Subsequent encounter.  EXAM: CT HEAD WITHOUT AND WITH CONTRAST  TECHNIQUE: Contiguous axial images were obtained from the base of the skull through the vertex without and with intravenous contrast  CONTRAST:  75 mL Isovue-300.  COMPARISON:  PET-CT 01/17/2014.  Face CT 08/21/2010.  FINDINGS: No acute or suspicious bone lesion of the calvarium identified. Visualized paranasal sinuses and mastoids are clear. Visualized orbit soft tissues are within normal limits. Visualized scalp soft tissues are within normal limits.  Chronic bulky dural calcifications incidentally noted, including along the tentorium greater on the right. No ventriculomegaly. No acute intracranial hemorrhage identified. No midline shift, mass effect, or evidence of intracranial mass lesion. Mild for age nonspecific white matter hypodensity. No evidence of cortically based acute infarction identified. Calcified atherosclerosis at the skull base. No abnormal enhancement identified. 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  IMPRESSION: No acute  or metastatic intracranial abnormality.   Electronically Signed   By: Lars Pinks M.D.   On: 01/29/2014 15:55   Nm Pet Image Initial (pi) Skull Base To Thigh  01/17/2014   ADDENDUM REPORT: 01/17/2014 14:38  ADDENDUM: The following was unintentionally omitted from the original report:  Mild inflammatory  changes along a prominent sigmoid diverticulum in the left pelvis (series 4/image 182), with associated mild hypermetabolism (PET image 178), correlate for signs/symptoms of mild sigmoid diverticulitis.   Electronically Signed   By: Julian Hy M.D.   On: 01/17/2014 14:38   01/17/2014   CLINICAL DATA:  Initial treatment strategy for lung nodule. History of prostate cancer with brachytherapy seeds.  EXAM: NUCLEAR MEDICINE PET SKULL BASE TO THIGH  TECHNIQUE: 13.0 mCi F-18 FDG was injected intravenously. Full-ring PET imaging was performed from the skull base to thigh after the radiotracer. CT data was obtained and used for attenuation correction and anatomic localization.  FASTING BLOOD GLUCOSE:  Value: 112 mg/dl  COMPARISON:  CT chest dated 01/09/2014. Bone scan dated 07/02/2013. CT abdomen pelvis dated 07/03/2011.  FINDINGS: NECK  6 mm short axis high right supraclavicular node (series 4/ image 45), max SUV 4.3.  CHEST  14 x 12 mm spiculated posterior right upper lobe nodule (series 8/image 17), corresponding to the CT abnormality, max SUV 1.6. While technically indeterminate, given the associated findings, this appearance is worrisome for primary bronchogenic neoplasm.  Evaluation of lung parenchyma is constrained by respiratory motion. Underlying emphysematous changes. No pleural effusion or pneumothorax.  Widespread thoracic lymphadenopathy, including:  --10 mm short axis right supraclavicular/level 7 node series 4/ image 50), max SUV 14.1  --8 mm short axis node at the left thoracic inlet (series 4/ image 49), max SUV 11.8  --8 mm short axis high right paratracheal node (series 4/image 58), max SUV 13.4   --1.7 cm short axis right prevascular node (series 4/ image 66), max SUV 13.9  --1.8 cm short axis right paratracheal node (series 4/image 56), max SUV 13.4  --1.8 cm short axis right hilar node (series 4/ image 71), max SUV 15.8  The heart is normal in size. Left subclavian pacemaker. Coronary atherosclerosis.  ABDOMEN/PELVIS  1.6 x 1.1 cm right adrenal nodule, max SUV 8.7, suspicious for metastasis.  No abnormal hypermetabolic activity within the liver, pancreas, or spleen.  No hypermetabolic lymph nodes in the abdomen or pelvis.  Brachytherapy seeds in the prostate.  Small fat containing left inguinal hernia. Vascular calcifications. 3.1 x 3.4 cm infrarenal abdominal aortic aneurysm.  SKELETON  18 mm lytic lesion in the right L5 vertebral body, max SUV 10.5. Additional lytic lesion in the right sacrum (series 4/ image 164), max SUV 8.8.  IMPRESSION: Mildly hypermetabolic spiculated nodule in the posterior right upper lobe, suspicious for primary bronchogenic neoplasm.  Extensive thoracic lymphadenopathy, including a hypermetabolic right supraclavicular node. Consider percutaneous biopsy of this node for tissue confirmation.  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  Osseous metastases involving the right L5 vertebral body and right sacrum.  Electronically Signed: By: Julian Hy M.D. On: 01/17/2014 09:55     ASSESSMENT/PLAN:  Non-small cell carcinoma of lung, stage 4 [name] is a pleasant 78 year old Caucasian gentleman recently diagnosed with stage IV (T1 A., N3, M1 B.) non-small cell lung cancer adenocarcinoma presenting with a right upper lobe nodule in addition to mediastinal and right supraclavicular lymphadenopathy and right adrenal and bone metastasis diagnosed in August of 2015. He is currently being treated with systemic chemotherapy in the form of carboplatin and Alimta and is status post 1 cycle. He tolerated his first cycle relatively well with the exception of some constipation which is  long-standing issue. He would like to change the date he of the week for his labs and chemotherapy as Thursdays is the day he plays golf and he  would like to continue to do so. Patients weekly labs and chemotherapy appointments will be changed to Tuesdays. He will followup in 2 weeks prior to the start of cycle #2. He is slightly neutropenic today with an ANC of 0.8 and a total white count of 2.5. He is afebrile. We will give him one injection of Granix 480 mcg subcutaneously today for neutrophil support. Neutropenic cautions were discussed with patient and his wife and both voiced understanding. He'll continue with weekly labs as scheduled.   Patient was discussed with and also seen by Dr. Julien Nordmann.  Awilda Metro E, PA-C 02/14/2014   All questions were answered. The patient knows to call the clinic with any problems, questions or concerns. We can certainly see the patient much sooner if necessary.  ADDENDUM: Hematology/Oncology Attending: I had the face to face encounter with the patient. I recommended his care plan. This is a very pleasant 78 years old [place] American male with stage IV non-small cell lung cancer, adenocarcinoma currently undergoing systemic chemotherapy with carboplatin and Alimta status post 1 cycle. The patient continues to have persistent neutropenia after the first cycle of his treatment. We will arrange for him to have one dose of Granix 480 mcg subcutaneous today. He will followup in 2 weeks with the start of cycle #2 as scheduled. He was advised to call immediately if he has any concerning symptoms in the interval.  Disclaimer: This note was dictated with voice recognition software. Similar sounding words can inadvertently be transcribed and may be missed upon review. Eilleen Kempf., MD 02/15/2014

## 2014-02-18 ENCOUNTER — Other Ambulatory Visit (HOSPITAL_BASED_OUTPATIENT_CLINIC_OR_DEPARTMENT_OTHER): Payer: Medicare Other

## 2014-02-18 ENCOUNTER — Other Ambulatory Visit: Payer: Self-pay | Admitting: Internal Medicine

## 2014-02-18 ENCOUNTER — Telehealth: Payer: Self-pay | Admitting: Medical Oncology

## 2014-02-18 ENCOUNTER — Telehealth: Payer: Self-pay | Admitting: Adult Health

## 2014-02-18 ENCOUNTER — Ambulatory Visit (HOSPITAL_BASED_OUTPATIENT_CLINIC_OR_DEPARTMENT_OTHER): Payer: Medicare Other

## 2014-02-18 VITALS — BP 148/63 | HR 79 | Temp 97.8°F

## 2014-02-18 DIAGNOSIS — Z5189 Encounter for other specified aftercare: Secondary | ICD-10-CM

## 2014-02-18 DIAGNOSIS — C3410 Malignant neoplasm of upper lobe, unspecified bronchus or lung: Secondary | ICD-10-CM

## 2014-02-18 DIAGNOSIS — C3491 Malignant neoplasm of unspecified part of right bronchus or lung: Secondary | ICD-10-CM

## 2014-02-18 DIAGNOSIS — C3490 Malignant neoplasm of unspecified part of unspecified bronchus or lung: Secondary | ICD-10-CM

## 2014-02-18 LAB — CBC WITH DIFFERENTIAL/PLATELET
BASO%: 0.1 % (ref 0.0–2.0)
Basophils Absolute: 0 10*3/uL (ref 0.0–0.1)
EOS%: 3.7 % (ref 0.0–7.0)
Eosinophils Absolute: 0.1 10*3/uL (ref 0.0–0.5)
HCT: 31.7 % — ABNORMAL LOW (ref 38.4–49.9)
HGB: 10.4 g/dL — ABNORMAL LOW (ref 13.0–17.1)
LYMPH%: 53.7 % — AB (ref 14.0–49.0)
MCH: 28.2 pg (ref 27.2–33.4)
MCHC: 32.9 g/dL (ref 32.0–36.0)
MCV: 85.9 fL (ref 79.3–98.0)
MONO#: 0.4 10*3/uL (ref 0.1–0.9)
MONO%: 22.2 % — AB (ref 0.0–14.0)
NEUT%: 20.3 % — ABNORMAL LOW (ref 39.0–75.0)
NEUTROS ABS: 0.4 10*3/uL — AB (ref 1.5–6.5)
PLATELETS: 84 10*3/uL — AB (ref 140–400)
RBC: 3.69 10*6/uL — AB (ref 4.20–5.82)
RDW: 14.9 % — ABNORMAL HIGH (ref 11.0–14.6)
WBC: 1.9 10*3/uL — ABNORMAL LOW (ref 4.0–10.3)
lymph#: 1 10*3/uL (ref 0.9–3.3)

## 2014-02-18 LAB — COMPREHENSIVE METABOLIC PANEL (CC13)
ALK PHOS: 100 U/L (ref 40–150)
ALT: 13 U/L (ref 0–55)
AST: 14 U/L (ref 5–34)
Albumin: 3.3 g/dL — ABNORMAL LOW (ref 3.5–5.0)
Anion Gap: 9 mEq/L (ref 3–11)
BILIRUBIN TOTAL: 1 mg/dL (ref 0.20–1.20)
BUN: 13.4 mg/dL (ref 7.0–26.0)
CO2: 23 mEq/L (ref 22–29)
Calcium: 9.3 mg/dL (ref 8.4–10.4)
Chloride: 106 mEq/L (ref 98–109)
Creatinine: 0.8 mg/dL (ref 0.7–1.3)
Glucose: 105 mg/dl (ref 70–140)
Potassium: 4.3 mEq/L (ref 3.5–5.1)
SODIUM: 138 meq/L (ref 136–145)
Total Protein: 6.6 g/dL (ref 6.4–8.3)

## 2014-02-18 MED ORDER — TBO-FILGRASTIM 480 MCG/0.8ML ~~LOC~~ SOSY
480.0000 ug | PREFILLED_SYRINGE | Freq: Once | SUBCUTANEOUS | Status: AC
  Administered 2014-02-18: 480 ug via SUBCUTANEOUS
  Filled 2014-02-18: qty 0.8

## 2014-02-18 NOTE — Telephone Encounter (Signed)
lvm for pt regarding to 9.9.15 appt.Nicholas KitchenMarland Schmitt

## 2014-02-18 NOTE — Telephone Encounter (Signed)
I left voice message for pt to call me back regarding labs

## 2014-02-18 NOTE — Patient Instructions (Signed)

## 2014-02-19 ENCOUNTER — Ambulatory Visit (HOSPITAL_BASED_OUTPATIENT_CLINIC_OR_DEPARTMENT_OTHER): Payer: Medicare Other

## 2014-02-19 VITALS — BP 102/58 | HR 79 | Temp 97.7°F

## 2014-02-19 DIAGNOSIS — C3410 Malignant neoplasm of upper lobe, unspecified bronchus or lung: Secondary | ICD-10-CM

## 2014-02-19 DIAGNOSIS — Z5189 Encounter for other specified aftercare: Secondary | ICD-10-CM

## 2014-02-19 DIAGNOSIS — C3490 Malignant neoplasm of unspecified part of unspecified bronchus or lung: Secondary | ICD-10-CM

## 2014-02-19 MED ORDER — TBO-FILGRASTIM 480 MCG/0.8ML ~~LOC~~ SOSY
480.0000 ug | PREFILLED_SYRINGE | Freq: Once | SUBCUTANEOUS | Status: AC
  Administered 2014-02-19: 480 ug via SUBCUTANEOUS
  Filled 2014-02-19: qty 0.8

## 2014-02-20 ENCOUNTER — Other Ambulatory Visit: Payer: Medicare Other

## 2014-02-24 ENCOUNTER — Ambulatory Visit (HOSPITAL_BASED_OUTPATIENT_CLINIC_OR_DEPARTMENT_OTHER): Payer: Medicare Other | Admitting: Adult Health

## 2014-02-24 ENCOUNTER — Other Ambulatory Visit: Payer: Self-pay | Admitting: Internal Medicine

## 2014-02-24 ENCOUNTER — Encounter: Payer: Self-pay | Admitting: Adult Health

## 2014-02-24 ENCOUNTER — Telehealth: Payer: Self-pay | Admitting: Adult Health

## 2014-02-24 ENCOUNTER — Other Ambulatory Visit: Payer: Self-pay | Admitting: *Deleted

## 2014-02-24 ENCOUNTER — Other Ambulatory Visit (HOSPITAL_BASED_OUTPATIENT_CLINIC_OR_DEPARTMENT_OTHER): Payer: Medicare Other

## 2014-02-24 VITALS — BP 131/77 | HR 77 | Temp 97.8°F | Resp 20 | Ht 72.0 in | Wt 208.1 lb

## 2014-02-24 DIAGNOSIS — C7970 Secondary malignant neoplasm of unspecified adrenal gland: Secondary | ICD-10-CM

## 2014-02-24 DIAGNOSIS — C3410 Malignant neoplasm of upper lobe, unspecified bronchus or lung: Secondary | ICD-10-CM

## 2014-02-24 DIAGNOSIS — C3490 Malignant neoplasm of unspecified part of unspecified bronchus or lung: Secondary | ICD-10-CM

## 2014-02-24 DIAGNOSIS — I2510 Atherosclerotic heart disease of native coronary artery without angina pectoris: Secondary | ICD-10-CM

## 2014-02-24 DIAGNOSIS — C3491 Malignant neoplasm of unspecified part of right bronchus or lung: Secondary | ICD-10-CM

## 2014-02-24 LAB — CBC WITH DIFFERENTIAL/PLATELET
BASO%: 0.2 % (ref 0.0–2.0)
Basophils Absolute: 0 10*3/uL (ref 0.0–0.1)
EOS%: 0.2 % (ref 0.0–7.0)
Eosinophils Absolute: 0 10*3/uL (ref 0.0–0.5)
HEMATOCRIT: 30.4 % — AB (ref 38.4–49.9)
HEMOGLOBIN: 10.1 g/dL — AB (ref 13.0–17.1)
LYMPH%: 13.1 % — AB (ref 14.0–49.0)
MCH: 28.8 pg (ref 27.2–33.4)
MCHC: 33.3 g/dL (ref 32.0–36.0)
MCV: 86.4 fL (ref 79.3–98.0)
MONO#: 0.2 10*3/uL (ref 0.1–0.9)
MONO%: 4.2 % (ref 0.0–14.0)
NEUT%: 82.3 % — AB (ref 39.0–75.0)
NEUTROS ABS: 3.5 10*3/uL (ref 1.5–6.5)
Platelets: 242 10*3/uL (ref 140–400)
RBC: 3.52 10*6/uL — ABNORMAL LOW (ref 4.20–5.82)
RDW: 15.9 % — ABNORMAL HIGH (ref 11.0–14.6)
WBC: 4.3 10*3/uL (ref 4.0–10.3)
lymph#: 0.6 10*3/uL — ABNORMAL LOW (ref 0.9–3.3)

## 2014-02-24 LAB — COMPREHENSIVE METABOLIC PANEL (CC13)
ALBUMIN: 3.3 g/dL — AB (ref 3.5–5.0)
ALT: 13 U/L (ref 0–55)
AST: 14 U/L (ref 5–34)
Alkaline Phosphatase: 110 U/L (ref 40–150)
Anion Gap: 11 mEq/L (ref 3–11)
BUN: 16.9 mg/dL (ref 7.0–26.0)
CO2: 20 mEq/L — ABNORMAL LOW (ref 22–29)
Calcium: 9.2 mg/dL (ref 8.4–10.4)
Chloride: 108 mEq/L (ref 98–109)
Creatinine: 1.1 mg/dL (ref 0.7–1.3)
GLUCOSE: 160 mg/dL — AB (ref 70–140)
POTASSIUM: 4.4 meq/L (ref 3.5–5.1)
Sodium: 139 mEq/L (ref 136–145)
TOTAL PROTEIN: 6.8 g/dL (ref 6.4–8.3)
Total Bilirubin: 0.79 mg/dL (ref 0.20–1.20)

## 2014-02-24 NOTE — Progress Notes (Signed)
No images are attached to the encounter. No scans are attached to the encounter. No scans are attached to the encounter. [facility] VISIT PROGRESS NOTE  [name], MD [place] [address]  DIAGNOSIS: Non-small cell carcinoma of lung, stage 4   Primary site: Lung (Right)   Staging method: AJCC 7th Edition   Clinical: Stage IV (T1a, N3, M1b) signed by Curt Bears, MD on 02/01/2014  1:42 PM   Summary: Stage IV (T1a, N3, M1b) Prostate cancer   Primary site: Prostate   Clinical: Stage I (T1c, NX, M0) signed by Wyatt Portela, MD on 08/06/2013  1:59 PM   Summary: Stage I (T1c, NX, M0)  PRIOR THERAPY: None  CURRENT THERAPY: Systemic chemotherapy with carboplatin for an AUC of 5 and Alimta 500 mg per meter squared given every 3 weeks. Cycle 2 day 1.    INTERVAL HISTORY: Nicholas Schmitt 78 y.o. male returns for a skin regular symptom management visit for followup of his recently diagnosed stage IV non-small cell lung cancer, adenocarcinoma. Juanito is doing well today.  He is here for evaluation prior to cycle 2 of Carboplatin/Alimta.  He continues to have mid thoracic pain that is constant and he did notice that it improved following treatment.  This pain he says has been present since diagnosis.  He otherwise denies fevers, chills, nausea, vomiting, constipation, diarrhea, numbness/tingling, skin/nail changes, or any further concerns.     MEDICAL HISTORY: Past Medical History  Diagnosis Date  . Hypertension   . CAD (coronary artery disease)     positive stress test in 2006 led to left heart cath (8/06) showing 95% prox RCA, 90% CFX, and 90% mLAD.  patient had a cypher DES to all 3 lesions  . Hyperlipidemia   . Hearing loss   . AAA (abdominal aortic aneurysm) 11/09    3.2 cm   . Chronic renal insufficiency   . History of radiation therapy 03/08/04- 04/08/04    prostate 4600 cGy in 3 fractions, radioactive seed implant 05/04/04  .  Myocardial infarction 1995  . Pacemaker 2014  . Dysrhythmia   . Shortness of breath     exertion  . Asthma     bronchial  . COPD (chronic obstructive pulmonary disease)   . Cancer 2015    prostate   . Prostate cancer 2005    gleason 7  . Skin cancer     ear    ALLERGIES:  has No Known Allergies.  MEDICATIONS:  Current Outpatient Prescriptions  Medication Sig Dispense Refill  . albuterol (PROVENTIL,VENTOLIN) 90 MCG/ACT inhaler Inhale 2 puffs into the lungs every 6 (six) hours as needed.  17 g  3  . amLODipine (NORVASC) 10 MG tablet Take 1 tablet (10 mg total) by mouth daily.  100 tablet  3  . atorvastatin (LIPITOR) 40 MG tablet Take 1 tablet (40 mg total) by mouth daily.  100 tablet  3  . budesonide-formoterol (SYMBICORT) 160-4.5 MCG/ACT inhaler Inhale 1 puff into the lungs as needed (shortness of breath).  4 Inhaler  3  . clopidogrel (PLAVIX) 75 MG tablet Take 75 mg by mouth daily. HOLD      . dexamethasone (DECADRON) 4 MG tablet 4 mg by mouth twice a day the day before, day of and day after the chemotherapy every 3 weeks.  40 tablet  1  . folic acid (FOLVITE) 1 MG tablet Take 1 tablet (1 mg total) by mouth daily.  [place]  tablet  3  . losartan (COZAAR) 100 MG tablet Take 1 tablet (100 mg total) by mouth daily.  100 tablet  3  . Multiple Vitamin (MULTIVITAMIN) tablet Take 1 tablet by mouth daily. HOLD      . naproxen sodium (ANAPROX) 220 MG tablet Take 220 mg by mouth as needed.      . nitroGLYCERIN (NITROSTAT) 0.4 MG SL tablet Place 1 tablet (0.4 mg total) under the tongue every 5 (five) minutes as needed.  25 tablet  1  . oxyCODONE (OXY IR/ROXICODONE) 5 MG immediate release tablet Take 1-2 tablets (5-10 mg total) by mouth every 4 (four) hours as needed for moderate pain.  30 tablet  0  . prochlorperazine (COMPAZINE) 10 MG tablet Take 1 tablet (10 mg total) by mouth every 6 (six) hours as needed for nausea or vomiting.  30 tablet  0   No current facility-administered medications for  this visit.    SURGICAL HISTORY:  Past Surgical History  Procedure Laterality Date  . Ptca  2006  . Coronary stent placement    . Permanent pacemaker insertion Bilateral 02/26/13    MDT Adapta L implanted by Dr Rayann Heman for sick sinus syndrome  . Radioactive seed implant  05/04/2004    8000 cGy, Dr Danny Lawless Dr Reece Agar  . Elbow surgery Bilateral 1973  . Insert / replace / remove pacemaker    . Coronary angioplasty    . Mediastinoscopy N/A 01/28/2014    Procedure: MEDIASTINOSCOPY;  Surgeon: Melrose Nakayama, MD;  Location: [facility];  Service: Thoracic;  Laterality: N/A;    REVIEW OF SYSTEMS:  Review of Systems  Constitutional: Negative.  Negative for fever, chills, weight loss, malaise/fatigue and diaphoresis.  HENT: Negative for congestion, ear discharge, ear pain, hearing loss, nosebleeds, sore throat and tinnitus.   Eyes: Negative for blurred vision, double vision, photophobia, pain, discharge and redness.  Respiratory: Negative for cough, hemoptysis, sputum production, shortness of breath, wheezing and stridor.   Cardiovascular: Negative for chest pain, palpitations, orthopnea, claudication, leg swelling and PND.  Gastrointestinal: Negative for heartburn, nausea, vomiting, abdominal pain, diarrhea, constipation, blood in stool and melena.  Genitourinary: Negative.   Musculoskeletal: Positive for back pain. Negative for falls, joint pain, myalgias and neck pain.  Skin: Negative.   Neurological: Negative for dizziness, tingling, focal weakness, seizures, weakness and headaches.  Endo/Heme/Allergies: Does not bruise/bleed easily.  Psychiatric/Behavioral: Negative for depression. The patient is not nervous/anxious and does not have insomnia.      PHYSICAL EXAMINATION: Physical Exam  Vitals reviewed. Constitutional: He is oriented to person, place, and time and well-developed, well-nourished, and in no distress.  HENT:  Head: Normocephalic and atraumatic.  Mouth/Throat:  Oropharynx is clear and moist.  Eyes: Pupils are equal, round, and reactive to light. No scleral icterus.  Neck: Normal range of motion. Neck supple. No JVD present. No tracheal deviation present. No thyromegaly present.  Cardiovascular: Normal rate, regular rhythm, normal heart sounds and intact distal pulses.  Exam reveals no gallop and no friction rub.   No murmur heard. Pulmonary/Chest: Effort normal and breath sounds normal. No respiratory distress. He has no wheezes. He has no rales.  Abdominal: Soft. Bowel sounds are normal. He exhibits no distension and no mass. There is no tenderness. There is no rebound and no guarding.  Musculoskeletal: Normal range of motion. He exhibits no edema and no tenderness.  No spinal tenderness to palpation   Lymphadenopathy:    He has no cervical adenopathy.  He has no axillary adenopathy.  Neurological: He is alert and oriented to person, place, and time. Gait normal.  Skin: Skin is warm and dry. No rash noted.  Psychiatric: Affect normal.    ECOG PERFORMANCE STATUS: 1 - Symptomatic but completely ambulatory  Blood pressure 131/77, pulse 77, temperature 97.8 F (36.6 C), temperature source Oral, resp. rate 20, height 6' (1.829 m), weight 208 lb 1.6 oz (94.394 kg).  LABORATORY DATA: Lab Results  Component Value Date   WBC 4.3 02/24/2014   HGB 10.1* 02/24/2014   HCT 30.4* 02/24/2014   MCV 86.4 02/24/2014   PLT 242 02/24/2014      Chemistry      Component Value Date/Time   NA 139 02/24/2014 1324   NA 143 01/24/2014 1543   K 4.4 02/24/2014 1324   K 4.1 01/24/2014 1543   CL 107 01/24/2014 1543   CO2 20* 02/24/2014 1324   CO2 21 01/24/2014 1543   BUN 16.9 02/24/2014 1324   BUN 15 01/24/2014 1543   CREATININE 1.1 02/24/2014 1324   CREATININE 0.80 01/24/2014 1543      Component Value Date/Time   CALCIUM 9.2 02/24/2014 1324   CALCIUM 9.1 01/24/2014 1543   ALKPHOS 110 02/24/2014 1324   ALKPHOS 103 01/24/2014 1543   AST 14 02/24/2014 1324   AST 17  01/24/2014 1543   ALT 13 02/24/2014 1324   ALT 17 01/24/2014 1543   BILITOT 0.79 02/24/2014 1324   BILITOT 0.6 01/24/2014 1543       RADIOGRAPHIC STUDIES:  Chest 2 View  01/24/2014   CLINICAL DATA:  Preop for thyroid surgery, also history of prostate carcinoma  EXAM: CHEST  2 VIEW  COMPARISON:  Chest x-ray of 01/07/2014 and CT chest of 01/09/2014  FINDINGS: The questioned nodular lesion in the right lung apex by recent CT of the chest is not well seen by plain film, possibly overlying the medial right clavicle. No focal infiltrate or effusion is seen. Mild cardiomegaly is stable with dual lead permanent pacemaker present. No acute bony abnormality is seen.  IMPRESSION: No active lung disease. Stable mild cardiomegaly with permanent pacemaker.   Electronically Signed   By: Ivar Drape M.D.   On: 01/24/2014 15:45   Ct Head W Wo Contrast  01/29/2014   CLINICAL DATA:  77 year old male with stage IV cancer. Staging. Pacemaker. Subsequent encounter.  EXAM: CT HEAD WITHOUT AND WITH CONTRAST  TECHNIQUE: Contiguous axial images were obtained from the base of the skull through the vertex without and with intravenous contrast  CONTRAST:  75 mL Isovue-300.  COMPARISON:  PET-CT 01/17/2014.  Face CT 08/21/2010.  FINDINGS: No acute or suspicious bone lesion of the calvarium identified. Visualized paranasal sinuses and mastoids are clear. Visualized orbit soft tissues are within normal limits. Visualized scalp soft tissues are within normal limits.  Chronic bulky dural calcifications incidentally noted, including along the tentorium greater on the right. No ventriculomegaly. No acute intracranial hemorrhage identified. No midline shift, mass effect, or evidence of intracranial mass lesion. Mild for age nonspecific white matter hypodensity. No evidence of cortically based acute infarction identified. Calcified atherosclerosis at the skull base. No abnormal enhancement identified. 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  IMPRESSION: No acute or metastatic intracranial abnormality.   Electronically Signed   By: Lars Pinks M.D.   On: 01/29/2014 15:55   Nm Pet Image Initial (pi) Skull Base To Thigh  01/17/2014   ADDENDUM REPORT: 01/17/2014 14:38  ADDENDUM: The following was unintentionally omitted from the original report:  Mild inflammatory changes along a prominent sigmoid diverticulum in the left pelvis (series 4/image 182), with associated mild hypermetabolism (PET image 178), correlate for signs/symptoms of mild sigmoid diverticulitis.   Electronically Signed   By: Julian Hy M.D.   On: 01/17/2014 14:38   01/17/2014   CLINICAL DATA:  Initial treatment strategy for lung nodule. History of prostate cancer with brachytherapy seeds.  EXAM: NUCLEAR MEDICINE PET SKULL BASE TO THIGH  TECHNIQUE: 13.0 mCi F-18 FDG was injected intravenously. Full-ring PET imaging was performed from the skull base to thigh after the radiotracer. CT data was obtained and used for attenuation correction and anatomic localization.  FASTING BLOOD GLUCOSE:  Value: 112 mg/dl  COMPARISON:  CT chest dated 01/09/2014. Bone scan dated 07/02/2013. CT abdomen pelvis dated 07/03/2011.  FINDINGS: NECK  6 mm short axis high right supraclavicular node (series 4/ image 45), max SUV 4.3.  CHEST  14 x 12 mm spiculated posterior right upper lobe nodule (series 8/image 17), corresponding to the CT abnormality, max SUV 1.6. While technically indeterminate, given the associated findings, this appearance is worrisome for primary bronchogenic neoplasm.  Evaluation of lung parenchyma is constrained by respiratory motion. Underlying emphysematous changes. No pleural effusion or pneumothorax.  Widespread thoracic lymphadenopathy, including:  --10 mm short axis right supraclavicular/level 7 node series 4/ image 50), max SUV 14.1  --8 mm short axis node at the left thoracic inlet (series 4/ image 49), max SUV 11.8  --8 mm short axis  high right paratracheal node (series 4/image 58), max SUV 13.4  --1.7 cm short axis right prevascular node (series 4/ image 66), max SUV 13.9  --1.8 cm short axis right paratracheal node (series 4/image 56), max SUV 13.4  --1.8 cm short axis right hilar node (series 4/ image 71), max SUV 15.8  The heart is normal in size. Left subclavian pacemaker. Coronary atherosclerosis.  ABDOMEN/PELVIS  1.6 x 1.1 cm right adrenal nodule, max SUV 8.7, suspicious for metastasis.  No abnormal hypermetabolic activity within the liver, pancreas, or spleen.  No hypermetabolic lymph nodes in the abdomen or pelvis.  Brachytherapy seeds in the prostate.  Small fat containing left inguinal hernia. Vascular calcifications. 3.1 x 3.4 cm infrarenal abdominal aortic aneurysm.  SKELETON  18 mm lytic lesion in the right L5 vertebral body, max SUV 10.5. Additional lytic lesion in the right sacrum (series 4/ image 164), max SUV 8.8.  IMPRESSION: Mildly hypermetabolic spiculated nodule in the posterior right upper lobe, suspicious for primary bronchogenic neoplasm.  Extensive thoracic lymphadenopathy, including a hypermetabolic right supraclavicular node. Consider percutaneous biopsy of this node for tissue confirmation.  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  Osseous metastases involving the right L5 vertebral body and right sacrum.  Electronically Signed: By: Julian Hy M.D. On: 01/17/2014 09:55     ASSESSMENT/PLAN:  No problem-specific assessment & plan notes found for this encounter.  Ameya Beel is doing well today.  He is a 78 year old man with recently diagnosed stage IV (T1A, N3, M1B) non small cell lung adenocarcinoma diagnosed in August, 2015.  He is seen prior to receiving cycle 2 day 1 of Carboplatin and Alimta.  He was previously neutropenic and his lab work has since recovered.  He will proceed with treatment tomorrow.  I reviewed his labs with him and his wife in detail.  I reviewed his lab work and progress with Dr.  Julien Nordmann.  He will receive treatment tomorrow, weekly lab  work, and return in 3 weeks for evaluation prior to cycle 3 of chemotherapy.   I reviewed the above plan with the patient.  He and his wife are in agreement with the above plan.  I did send them by scheduling for a provider appointment.   He knows to call us in the interim for any questions or concerns.  We can certainly see him sooner if needed.  I spent 25 minutes counseling the patient face to face.  The total time spent in the appointment was 30 minutes.  Minette Headland, [place] [phone]

## 2014-02-24 NOTE — Telephone Encounter (Signed)
per pof to sch pt appt-gave pt copy of sch °

## 2014-02-25 ENCOUNTER — Encounter (HOSPITAL_COMMUNITY): Payer: Self-pay

## 2014-02-25 ENCOUNTER — Ambulatory Visit (HOSPITAL_BASED_OUTPATIENT_CLINIC_OR_DEPARTMENT_OTHER): Payer: Medicare Other

## 2014-02-25 VITALS — BP 123/65 | HR 71 | Temp 98.7°F | Resp 20

## 2014-02-25 DIAGNOSIS — Z5111 Encounter for antineoplastic chemotherapy: Secondary | ICD-10-CM

## 2014-02-25 DIAGNOSIS — C3491 Malignant neoplasm of unspecified part of right bronchus or lung: Secondary | ICD-10-CM

## 2014-02-25 DIAGNOSIS — C3410 Malignant neoplasm of upper lobe, unspecified bronchus or lung: Secondary | ICD-10-CM

## 2014-02-25 DIAGNOSIS — C7970 Secondary malignant neoplasm of unspecified adrenal gland: Secondary | ICD-10-CM

## 2014-02-25 MED ORDER — ONDANSETRON 16 MG/50ML IVPB (CHCC)
16.0000 mg | Freq: Once | INTRAVENOUS | Status: AC
  Administered 2014-02-25: 16 mg via INTRAVENOUS

## 2014-02-25 MED ORDER — ONDANSETRON 16 MG/50ML IVPB (CHCC)
INTRAVENOUS | Status: AC
  Filled 2014-02-25: qty 16

## 2014-02-25 MED ORDER — SODIUM CHLORIDE 0.9 % IV SOLN
462.6000 mg | Freq: Once | INTRAVENOUS | Status: AC
  Administered 2014-02-25: 460 mg via INTRAVENOUS
  Filled 2014-02-25: qty 46

## 2014-02-25 MED ORDER — DEXAMETHASONE SODIUM PHOSPHATE 20 MG/5ML IJ SOLN
20.0000 mg | Freq: Once | INTRAMUSCULAR | Status: AC
  Administered 2014-02-25: 20 mg via INTRAVENOUS

## 2014-02-25 MED ORDER — DEXAMETHASONE SODIUM PHOSPHATE 20 MG/5ML IJ SOLN
INTRAMUSCULAR | Status: AC
  Filled 2014-02-25: qty 5

## 2014-02-25 MED ORDER — SODIUM CHLORIDE 0.9 % IV SOLN
Freq: Once | INTRAVENOUS | Status: AC
  Administered 2014-02-25: 11:00:00 via INTRAVENOUS

## 2014-02-25 MED ORDER — SODIUM CHLORIDE 0.9 % IV SOLN
400.0000 mg/m2 | Freq: Once | INTRAVENOUS | Status: AC
  Administered 2014-02-25: 875 mg via INTRAVENOUS
  Filled 2014-02-25: qty 35

## 2014-02-25 NOTE — Patient Instructions (Signed)
[facility] Discharge Instructions for Patients Receiving Chemotherapy  Today you received the following chemotherapy agents Alimta and Carboplatin  To help prevent nausea and vomiting after your treatment, we encourage you to take your nausea medication as prescribed.   If you develop nausea and vomiting that is not controlled by your nausea medication, call the clinic.   BELOW ARE SYMPTOMS THAT SHOULD BE REPORTED IMMEDIATELY:  *FEVER GREATER THAN 100.5 F  *CHILLS WITH OR WITHOUT FEVER  NAUSEA AND VOMITING THAT IS NOT CONTROLLED WITH YOUR NAUSEA MEDICATION  *UNUSUAL SHORTNESS OF BREATH  *UNUSUAL BRUISING OR BLEEDING  TENDERNESS IN MOUTH AND THROAT WITH OR WITHOUT PRESENCE OF ULCERS  *URINARY PROBLEMS  *BOWEL PROBLEMS  UNUSUAL RASH Items with * indicate a potential emergency and should be followed up as soon as possible.  Feel free to call the clinic you have any questions or concerns. The clinic phone number is [phone].

## 2014-02-26 ENCOUNTER — Ambulatory Visit (INDEPENDENT_AMBULATORY_CARE_PROVIDER_SITE_OTHER): Payer: Medicare Other | Admitting: Internal Medicine

## 2014-02-26 ENCOUNTER — Encounter: Payer: Self-pay | Admitting: Internal Medicine

## 2014-02-26 VITALS — BP 134/72 | HR 73 | Ht 72.0 in | Wt 211.0 lb

## 2014-02-26 DIAGNOSIS — I495 Sick sinus syndrome: Secondary | ICD-10-CM

## 2014-02-26 DIAGNOSIS — I2510 Atherosclerotic heart disease of native coronary artery without angina pectoris: Secondary | ICD-10-CM

## 2014-02-26 DIAGNOSIS — I10 Essential (primary) hypertension: Secondary | ICD-10-CM

## 2014-02-26 NOTE — Patient Instructions (Addendum)
Remote monitoring is used to monitor your pacemaker from home. This monitoring reduces the number of office visits required to check your device to one time per year. It allows [place] to keep an eye on the functioning of your device to ensure it is working properly. You are scheduled for a device check from home on 05-29-2014. You may send your transmission at any time that day. If you have a wireless device, the transmission will be sent automatically. After your physician reviews your transmission, you will receive a postcard with your next transmission date.  Your physician recommends that you schedule a follow-up appointment in: 12 months with Dr.Allred   s

## 2014-02-27 ENCOUNTER — Ambulatory Visit: Payer: Medicare Other

## 2014-02-27 ENCOUNTER — Other Ambulatory Visit: Payer: Medicare Other

## 2014-02-27 LAB — MDC_IDC_ENUM_SESS_TYPE_INCLINIC
Battery Impedance: 111 Ohm
Battery Remaining Longevity: 144 mo
Battery Voltage: 2.8 V
Brady Statistic AP VP Percent: 3 %
Lead Channel Impedance Value: 495 Ohm
Lead Channel Impedance Value: 560 Ohm
Lead Channel Pacing Threshold Amplitude: 0.5 V
Lead Channel Pacing Threshold Pulse Width: 0.4 ms
Lead Channel Sensing Intrinsic Amplitude: 1.4 mV
Lead Channel Sensing Intrinsic Amplitude: 11.2 mV
Lead Channel Setting Pacing Amplitude: 2 V
Lead Channel Setting Pacing Pulse Width: 0.4 ms
MDC IDC MSMT LEADCHNL RA PACING THRESHOLD AMPLITUDE: 0.75 V
MDC IDC MSMT LEADCHNL RA PACING THRESHOLD PULSEWIDTH: 0.4 ms
MDC IDC SESS DTM: 20150916145937
MDC IDC SET LEADCHNL RV PACING AMPLITUDE: 2.5 V
MDC IDC SET LEADCHNL RV SENSING SENSITIVITY: 4 mV
MDC IDC STAT BRADY AP VS PERCENT: 74 %
MDC IDC STAT BRADY AS VP PERCENT: 1 %
MDC IDC STAT BRADY AS VS PERCENT: 22 %

## 2014-03-01 NOTE — Progress Notes (Signed)
PCP: [name], MD Primary Cardiologist:  Dr Particia Jasper is a 78 y.o. male who presents today for routine electrophysiology followup.  Since his last visit, he has been diagnosed with metastatic cancer.  He is followed in the oncology clinic with treatments ongoing.  Today, he denies symptoms of palpitations, chest pain, shortness of breath,  lower extremity edema, dizziness, presyncope, or syncope.  The patient is otherwise without complaint today.   Past Medical History  Diagnosis Date  . Hypertension   . CAD (coronary artery disease)     positive stress test in 2006 led to left heart cath (8/06) showing 95% prox RCA, 90% CFX, and 90% mLAD.  patient had a cypher DES to all 3 lesions  . Hyperlipidemia   . Hearing loss   . AAA (abdominal aortic aneurysm) 11/09    3.2 cm   . Chronic renal insufficiency   . History of radiation therapy 03/08/04- 04/08/04    prostate 4600 cGy in 3 fractions, radioactive seed implant 05/04/04  . Myocardial infarction 1995  . Pacemaker 2014  . Dysrhythmia   . Shortness of breath     exertion  . Asthma     bronchial  . COPD (chronic obstructive pulmonary disease)   . Cancer 2015    prostate   . Prostate cancer 2005    gleason 7  . Skin cancer     ear   Past Surgical History  Procedure Laterality Date  . Ptca  2006  . Coronary stent placement    . Permanent pacemaker insertion Bilateral 02/26/13    MDT Adapta L implanted by Dr Rayann Heman for sick sinus syndrome  . Radioactive seed implant  05/04/2004    8000 cGy, Dr Danny Lawless Dr Reece Agar  . Elbow surgery Bilateral 1973  . Insert / replace / remove pacemaker    . Coronary angioplasty    . Mediastinoscopy N/A 01/28/2014    Procedure: MEDIASTINOSCOPY;  Surgeon: Melrose Nakayama, MD;  Location: [facility];  Service: Thoracic;  Laterality: N/A;    Current Outpatient Prescriptions  Medication Sig Dispense Refill  . albuterol (PROVENTIL,VENTOLIN) 90 MCG/ACT inhaler Inhale 2 puffs  into the lungs every 6 (six) hours as needed for wheezing or shortness of breath.      [name] amLODipine (NORVASC) 10 MG tablet Take 1 tablet (10 mg total) by mouth daily.  100 tablet  3  . atorvastatin (LIPITOR) 40 MG tablet Take 1 tablet (40 mg total) by mouth daily.  100 tablet  3  . budesonide-formoterol (SYMBICORT) 160-4.5 MCG/ACT inhaler Inhale 1 puff into the lungs as needed (shortness of breath).  4 Inhaler  3  . clopidogrel (PLAVIX) 75 MG tablet Take 75 mg by mouth daily.       [name] dexamethasone (DECADRON) 4 MG tablet 4 mg by mouth twice a day the day before, day of and day after the chemotherapy every 3 weeks.  40 tablet  1  . folic acid (FOLVITE) 1 MG tablet Take 1 tablet (1 mg total) by mouth daily.  30 tablet  3  . losartan (COZAAR) 100 MG tablet Take 1 tablet (100 mg total) by mouth daily.  100 tablet  3  . Multiple Minerals (CALCIUM/MAGNESIUM/ZINC PO) Take 1 capsule by mouth every evening.      . Multiple Vitamin (MULTIVITAMIN) tablet Take 1 tablet by mouth daily.       . naproxen sodium (ANAPROX) 220 MG tablet Take 220 mg by mouth as needed (pain).       [name]  nitroGLYCERIN (NITROSTAT) 0.4 MG SL tablet Place 0.4 mg under the tongue every 5 (five) minutes as needed for chest pain (MAX 3 TABLETS).      [name] oxyCODONE (OXY IR/ROXICODONE) 5 MG immediate release tablet Take 1-2 tablets (5-10 mg total) by mouth every 4 (four) hours as needed for moderate pain.  30 tablet  0  . prochlorperazine (COMPAZINE) 10 MG tablet Take 1 tablet (10 mg total) by mouth every 6 (six) hours as needed for nausea or vomiting.  30 tablet  0   No current facility-administered medications for this visit.    Physical Exam: Filed Vitals:   02/26/14 1055  BP: 134/72  Pulse: 73  Height: 6' (1.829 m)  Weight: 211 lb (95.709 kg)    GEN- The patient is well appearing, alert and oriented x 3 today.   Head- normocephalic, atraumatic Eyes-  Sclera clear, conjunctiva pink Ears- hearing intact Oropharynx- clear Lungs-  Clear to ausculation bilaterally, normal work of breathing Chest- pacemaker pocket is well healed Heart- Regular rate and rhythm, no murmurs, rubs or gallops, PMI not laterally displaced GI- soft, NT, ND, + BS Extremities- no clubbing, cyanosis, or edema  Pacemaker interrogation- reviewed in detail today,  See PACEART report  Assessment and Plan:  1. Sick sinus syndrome- presyncope is resolved Normal pacemaker function See Pace Art report No changes today  2. Cad Stable No change required today  3. HTN Stable No change required today  [facility] Return in 12 months to the EP clinic

## 2014-03-04 ENCOUNTER — Other Ambulatory Visit (HOSPITAL_BASED_OUTPATIENT_CLINIC_OR_DEPARTMENT_OTHER): Payer: Medicare Other

## 2014-03-04 DIAGNOSIS — C7970 Secondary malignant neoplasm of unspecified adrenal gland: Secondary | ICD-10-CM

## 2014-03-04 DIAGNOSIS — C3491 Malignant neoplasm of unspecified part of right bronchus or lung: Secondary | ICD-10-CM

## 2014-03-04 DIAGNOSIS — C3410 Malignant neoplasm of upper lobe, unspecified bronchus or lung: Secondary | ICD-10-CM

## 2014-03-04 LAB — CBC WITH DIFFERENTIAL/PLATELET
BASO%: 1.2 % (ref 0.0–2.0)
Basophils Absolute: 0 10*3/uL (ref 0.0–0.1)
EOS ABS: 0 10*3/uL (ref 0.0–0.5)
EOS%: 1.2 % (ref 0.0–7.0)
HCT: 30.7 % — ABNORMAL LOW (ref 38.4–49.9)
HGB: 10 g/dL — ABNORMAL LOW (ref 13.0–17.1)
LYMPH#: 0.8 10*3/uL — AB (ref 0.9–3.3)
LYMPH%: 50.6 % — ABNORMAL HIGH (ref 14.0–49.0)
MCH: 28.4 pg (ref 27.2–33.4)
MCHC: 32.6 g/dL (ref 32.0–36.0)
MCV: 87.2 fL (ref 79.3–98.0)
MONO#: 0.2 10*3/uL (ref 0.1–0.9)
MONO%: 9.1 % (ref 0.0–14.0)
NEUT%: 37.9 % — ABNORMAL LOW (ref 39.0–75.0)
NEUTROS ABS: 0.6 10*3/uL — AB (ref 1.5–6.5)
Platelets: 219 10*3/uL (ref 140–400)
RBC: 3.52 10*6/uL — AB (ref 4.20–5.82)
RDW: 16.1 % — AB (ref 11.0–14.6)
WBC: 1.6 10*3/uL — AB (ref 4.0–10.3)

## 2014-03-04 LAB — COMPREHENSIVE METABOLIC PANEL (CC13)
ALBUMIN: 3.3 g/dL — AB (ref 3.5–5.0)
ALT: 26 U/L (ref 0–55)
ANION GAP: 8 meq/L (ref 3–11)
AST: 20 U/L (ref 5–34)
Alkaline Phosphatase: 97 U/L (ref 40–150)
BILIRUBIN TOTAL: 1.29 mg/dL — AB (ref 0.20–1.20)
BUN: 17.7 mg/dL (ref 7.0–26.0)
CO2: 24 meq/L (ref 22–29)
Calcium: 9.3 mg/dL (ref 8.4–10.4)
Chloride: 106 mEq/L (ref 98–109)
Creatinine: 0.8 mg/dL (ref 0.7–1.3)
GLUCOSE: 96 mg/dL (ref 70–140)
POTASSIUM: 4.6 meq/L (ref 3.5–5.1)
SODIUM: 138 meq/L (ref 136–145)
TOTAL PROTEIN: 6.6 g/dL (ref 6.4–8.3)

## 2014-03-06 ENCOUNTER — Other Ambulatory Visit: Payer: Medicare Other

## 2014-03-11 ENCOUNTER — Other Ambulatory Visit: Payer: Medicare Other

## 2014-03-12 ENCOUNTER — Other Ambulatory Visit (HOSPITAL_BASED_OUTPATIENT_CLINIC_OR_DEPARTMENT_OTHER): Payer: Medicare Other

## 2014-03-12 DIAGNOSIS — C7970 Secondary malignant neoplasm of unspecified adrenal gland: Secondary | ICD-10-CM

## 2014-03-12 DIAGNOSIS — C3491 Malignant neoplasm of unspecified part of right bronchus or lung: Secondary | ICD-10-CM

## 2014-03-12 DIAGNOSIS — C3410 Malignant neoplasm of upper lobe, unspecified bronchus or lung: Secondary | ICD-10-CM

## 2014-03-12 LAB — COMPREHENSIVE METABOLIC PANEL (CC13)
ALT: 21 U/L (ref 0–55)
AST: 16 U/L (ref 5–34)
Albumin: 3.2 g/dL — ABNORMAL LOW (ref 3.5–5.0)
Alkaline Phosphatase: 103 U/L (ref 40–150)
Anion Gap: 7 mEq/L (ref 3–11)
BILIRUBIN TOTAL: 1.07 mg/dL (ref 0.20–1.20)
BUN: 13.9 mg/dL (ref 7.0–26.0)
CO2: 25 mEq/L (ref 22–29)
CREATININE: 0.8 mg/dL (ref 0.7–1.3)
Calcium: 9.6 mg/dL (ref 8.4–10.4)
Chloride: 107 mEq/L (ref 98–109)
Glucose: 96 mg/dl (ref 70–140)
Potassium: 4.2 mEq/L (ref 3.5–5.1)
SODIUM: 139 meq/L (ref 136–145)
TOTAL PROTEIN: 6.6 g/dL (ref 6.4–8.3)

## 2014-03-12 LAB — CBC WITH DIFFERENTIAL/PLATELET
BASO%: 0.3 % (ref 0.0–2.0)
Basophils Absolute: 0 10*3/uL (ref 0.0–0.1)
EOS%: 7.6 % — AB (ref 0.0–7.0)
Eosinophils Absolute: 0.2 10*3/uL (ref 0.0–0.5)
HEMATOCRIT: 27.2 % — AB (ref 38.4–49.9)
HGB: 9 g/dL — ABNORMAL LOW (ref 13.0–17.1)
LYMPH#: 1.1 10*3/uL (ref 0.9–3.3)
LYMPH%: 38.1 % (ref 14.0–49.0)
MCH: 28.7 pg (ref 27.2–33.4)
MCHC: 33.1 g/dL (ref 32.0–36.0)
MCV: 86.8 fL (ref 79.3–98.0)
MONO#: 0.6 10*3/uL (ref 0.1–0.9)
MONO%: 22.5 % — ABNORMAL HIGH (ref 0.0–14.0)
NEUT#: 0.9 10*3/uL — ABNORMAL LOW (ref 1.5–6.5)
NEUT%: 31.5 % — AB (ref 39.0–75.0)
PLATELETS: 110 10*3/uL — AB (ref 140–400)
RBC: 3.14 10*6/uL — ABNORMAL LOW (ref 4.20–5.82)
RDW: 18 % — ABNORMAL HIGH (ref 11.0–14.6)
WBC: 2.8 10*3/uL — AB (ref 4.0–10.3)

## 2014-03-18 ENCOUNTER — Other Ambulatory Visit (HOSPITAL_BASED_OUTPATIENT_CLINIC_OR_DEPARTMENT_OTHER): Payer: Medicare Other

## 2014-03-18 ENCOUNTER — Encounter: Payer: Self-pay | Admitting: Adult Health

## 2014-03-18 ENCOUNTER — Other Ambulatory Visit: Payer: Medicare Other

## 2014-03-18 ENCOUNTER — Ambulatory Visit: Payer: Medicare Other | Admitting: Adult Health

## 2014-03-18 ENCOUNTER — Telehealth: Payer: Self-pay | Admitting: Adult Health

## 2014-03-18 ENCOUNTER — Ambulatory Visit (HOSPITAL_BASED_OUTPATIENT_CLINIC_OR_DEPARTMENT_OTHER): Payer: Medicare Other | Admitting: Adult Health

## 2014-03-18 ENCOUNTER — Ambulatory Visit (HOSPITAL_BASED_OUTPATIENT_CLINIC_OR_DEPARTMENT_OTHER): Payer: Medicare Other

## 2014-03-18 ENCOUNTER — Telehealth: Payer: Self-pay | Admitting: *Deleted

## 2014-03-18 VITALS — BP 124/63 | HR 84 | Temp 97.7°F | Resp 18 | Ht 72.0 in | Wt 211.9 lb

## 2014-03-18 DIAGNOSIS — C7970 Secondary malignant neoplasm of unspecified adrenal gland: Secondary | ICD-10-CM

## 2014-03-18 DIAGNOSIS — Z5111 Encounter for antineoplastic chemotherapy: Secondary | ICD-10-CM

## 2014-03-18 DIAGNOSIS — C3491 Malignant neoplasm of unspecified part of right bronchus or lung: Secondary | ICD-10-CM

## 2014-03-18 DIAGNOSIS — C3490 Malignant neoplasm of unspecified part of unspecified bronchus or lung: Secondary | ICD-10-CM

## 2014-03-18 DIAGNOSIS — C3411 Malignant neoplasm of upper lobe, right bronchus or lung: Secondary | ICD-10-CM

## 2014-03-18 DIAGNOSIS — M549 Dorsalgia, unspecified: Secondary | ICD-10-CM

## 2014-03-18 DIAGNOSIS — Z23 Encounter for immunization: Secondary | ICD-10-CM

## 2014-03-18 DIAGNOSIS — Z8546 Personal history of malignant neoplasm of prostate: Secondary | ICD-10-CM

## 2014-03-18 LAB — COMPREHENSIVE METABOLIC PANEL (CC13)
ALK PHOS: 98 U/L (ref 40–150)
ALT: 18 U/L (ref 0–55)
AST: 16 U/L (ref 5–34)
Albumin: 3.2 g/dL — ABNORMAL LOW (ref 3.5–5.0)
Anion Gap: 9 mEq/L (ref 3–11)
BILIRUBIN TOTAL: 0.81 mg/dL (ref 0.20–1.20)
BUN: 21.8 mg/dL (ref 7.0–26.0)
CALCIUM: 9.6 mg/dL (ref 8.4–10.4)
CHLORIDE: 110 meq/L — AB (ref 98–109)
CO2: 21 mEq/L — ABNORMAL LOW (ref 22–29)
CREATININE: 0.9 mg/dL (ref 0.7–1.3)
Glucose: 163 mg/dl — ABNORMAL HIGH (ref 70–140)
Potassium: 4.2 mEq/L (ref 3.5–5.1)
Sodium: 140 mEq/L (ref 136–145)
Total Protein: 6.7 g/dL (ref 6.4–8.3)

## 2014-03-18 LAB — CBC WITH DIFFERENTIAL/PLATELET
BASO%: 0.1 % (ref 0.0–2.0)
BASOS ABS: 0 10*3/uL (ref 0.0–0.1)
EOS%: 0 % (ref 0.0–7.0)
Eosinophils Absolute: 0 10*3/uL (ref 0.0–0.5)
HCT: 27.7 % — ABNORMAL LOW (ref 38.4–49.9)
HEMOGLOBIN: 9 g/dL — AB (ref 13.0–17.1)
LYMPH%: 7 % — AB (ref 14.0–49.0)
MCH: 29 pg (ref 27.2–33.4)
MCHC: 32.5 g/dL (ref 32.0–36.0)
MCV: 89.4 fL (ref 79.3–98.0)
MONO#: 0.2 10*3/uL (ref 0.1–0.9)
MONO%: 1.7 % (ref 0.0–14.0)
NEUT#: 9 10*3/uL — ABNORMAL HIGH (ref 1.5–6.5)
NEUT%: 91.2 % — AB (ref 39.0–75.0)
PLATELETS: 259 10*3/uL (ref 140–400)
RBC: 3.1 10*6/uL — ABNORMAL LOW (ref 4.20–5.82)
RDW: 18.9 % — ABNORMAL HIGH (ref 11.0–14.6)
WBC: 9.9 10*3/uL (ref 4.0–10.3)
lymph#: 0.7 10*3/uL — ABNORMAL LOW (ref 0.9–3.3)

## 2014-03-18 MED ORDER — CYANOCOBALAMIN 1000 MCG/ML IJ SOLN
1000.0000 ug | Freq: Once | INTRAMUSCULAR | Status: AC
  Administered 2014-03-18: 1000 ug via INTRAMUSCULAR

## 2014-03-18 MED ORDER — INFLUENZA VAC SPLIT QUAD 0.5 ML IM SUSY
0.5000 mL | PREFILLED_SYRINGE | Freq: Once | INTRAMUSCULAR | Status: AC
  Administered 2014-03-18: 0.5 mL via INTRAMUSCULAR
  Filled 2014-03-18: qty 0.5

## 2014-03-18 MED ORDER — ONDANSETRON 16 MG/50ML IVPB (CHCC)
INTRAVENOUS | Status: AC
  Filled 2014-03-18: qty 16

## 2014-03-18 MED ORDER — CYANOCOBALAMIN 1000 MCG/ML IJ SOLN
INTRAMUSCULAR | Status: AC
  Filled 2014-03-18: qty 1

## 2014-03-18 MED ORDER — DEXAMETHASONE SODIUM PHOSPHATE 20 MG/5ML IJ SOLN
20.0000 mg | Freq: Once | INTRAMUSCULAR | Status: AC
  Administered 2014-03-18: 20 mg via INTRAVENOUS

## 2014-03-18 MED ORDER — SODIUM CHLORIDE 0.9 % IV SOLN
Freq: Once | INTRAVENOUS | Status: AC
  Administered 2014-03-18: 10:00:00 via INTRAVENOUS

## 2014-03-18 MED ORDER — SODIUM CHLORIDE 0.9 % IV SOLN
442.4000 mg | Freq: Once | INTRAVENOUS | Status: AC
  Administered 2014-03-18: 440 mg via INTRAVENOUS
  Filled 2014-03-18: qty 44

## 2014-03-18 MED ORDER — PEMETREXED DISODIUM CHEMO INJECTION 500 MG
400.0000 mg/m2 | Freq: Once | INTRAVENOUS | Status: AC
  Administered 2014-03-18: 875 mg via INTRAVENOUS
  Filled 2014-03-18: qty 35

## 2014-03-18 MED ORDER — DEXAMETHASONE SODIUM PHOSPHATE 20 MG/5ML IJ SOLN
INTRAMUSCULAR | Status: AC
  Filled 2014-03-18: qty 5

## 2014-03-18 MED ORDER — ONDANSETRON 16 MG/50ML IVPB (CHCC)
16.0000 mg | Freq: Once | INTRAVENOUS | Status: AC
  Administered 2014-03-18: 16 mg via INTRAVENOUS

## 2014-03-18 NOTE — Progress Notes (Signed)
No images are attached to the encounter. No scans are attached to the encounter. No scans are attached to the encounter. [place], MD [place] [id number]  DIAGNOSIS: Non-small cell carcinoma of lung, stage 4   Primary site: Lung (Right)   Staging method: AJCC 7th Edition   Clinical: Stage IV (T1a, N3, M1b) signed by Curt Bears, MD on 02/01/2014  1:42 PM   Summary: Stage IV (T1a, N3, M1b) Prostate cancer   Primary site: Prostate   Clinical: Stage I (T1c, NX, M0) signed by Wyatt Portela, MD on 08/06/2013  1:59 PM   Summary: Stage I (T1c, NX, M0)  PRIOR THERAPY: None  CURRENT THERAPY: Systemic chemotherapy with carboplatin for an AUC of 5 and Alimta 400 mg per meter squared given every 3 weeks. Cycle 3 day 1.    INTERVAL HISTORY: Nicholas Schmitt 78 y.o. male returns for a regular symptom management visit for followup of his recently diagnosed stage IV non-small cell lung cancer.  He is here prior to receiving his third cycle of chemotherapy.  He tolerated his second cycle very well.  He does endorse a slight increase in his fatigue, and cannot walk as many laps around his block (2/10 of a mile).  His back pain is stable, though still there.  He does feel like it is improving and doesn't feel like a stabbing pain any longer.  He occasionally takes Oxycodone and aleve and this does help.    MEDICAL HISTORY: Past Medical History  Diagnosis Date  . Hypertension   . CAD (coronary artery disease)     positive stress test in 2006 led to left heart cath (8/06) showing 95% prox RCA, 90% CFX, and 90% mLAD.  patient had a cypher DES to all 3 lesions  . Hyperlipidemia   . Hearing loss   . AAA (abdominal aortic aneurysm) 11/09    3.2 cm   . Chronic renal insufficiency   . History of radiation therapy 03/08/04- 04/08/04    prostate 4600 cGy in 3 fractions, radioactive seed implant 05/04/04  . Myocardial infarction  1995  . Pacemaker 2014  . Dysrhythmia   . Shortness of breath     exertion  . Asthma     bronchial  . COPD (chronic obstructive pulmonary disease)   . Cancer 2015    prostate   . Prostate cancer 2005    gleason 7  . Skin cancer     ear    ALLERGIES:  has No Known Allergies.  MEDICATIONS:  Current Outpatient Prescriptions  Medication Sig Dispense Refill  . albuterol (PROVENTIL,VENTOLIN) 90 MCG/ACT inhaler Inhale 2 puffs into the lungs every 6 (six) hours as needed for wheezing or shortness of breath.      [name] amLODipine (NORVASC) 10 MG tablet Take 1 tablet (10 mg total) by mouth daily.  100 tablet  3  . atorvastatin (LIPITOR) 40 MG tablet Take 1 tablet (40 mg total) by mouth daily.  100 tablet  3  . budesonide-formoterol (SYMBICORT) 160-4.5 MCG/ACT inhaler Inhale 1 puff into the lungs as needed (shortness of breath).  4 Inhaler  3  . clopidogrel (PLAVIX) 75 MG tablet Take 75 mg by mouth daily.       [name] dexamethasone (DECADRON) 4 MG tablet 4 mg by mouth twice a day the day before, day of and day after the chemotherapy every 3 weeks.  40 tablet  1  .  folic acid (FOLVITE) 1 MG tablet Take 1 tablet (1 mg total) by mouth daily.  30 tablet  3  . losartan (COZAAR) 100 MG tablet Take 1 tablet (100 mg total) by mouth daily.  100 tablet  3  . Multiple Minerals (CALCIUM/MAGNESIUM/ZINC PO) Take 1 capsule by mouth every evening.      . Multiple Vitamin (MULTIVITAMIN) tablet Take 1 tablet by mouth daily.       . naproxen sodium (ANAPROX) 220 MG tablet Take 220 mg by mouth as needed (pain).       [name] oxyCODONE (OXY IR/ROXICODONE) 5 MG immediate release tablet Take 1-2 tablets (5-10 mg total) by mouth every 4 (four) hours as needed for moderate pain.  30 tablet  0  . prochlorperazine (COMPAZINE) 10 MG tablet Take 1 tablet (10 mg total) by mouth every 6 (six) hours as needed for nausea or vomiting.  30 tablet  0  . nitroGLYCERIN (NITROSTAT) 0.4 MG SL tablet Place 0.4 mg under the tongue every 5 (five)  minutes as needed for chest pain (MAX 3 TABLETS).       No current facility-administered medications for this visit.    SURGICAL HISTORY:  Past Surgical History  Procedure Laterality Date  . Ptca  2006  . Coronary stent placement    . Permanent pacemaker insertion Bilateral 02/26/13    MDT Adapta L implanted by Dr Rayann Heman for sick sinus syndrome  . Radioactive seed implant  05/04/2004    8000 cGy, Dr Danny Lawless Dr Reece Agar  . Elbow surgery Bilateral 1973  . Insert / replace / remove pacemaker    . Coronary angioplasty    . Mediastinoscopy N/A 01/28/2014    Procedure: MEDIASTINOSCOPY;  Surgeon: Melrose Nakayama, MD;  Location: [facility];  Service: Thoracic;  Laterality: N/A;    REVIEW OF SYSTEMS:  Review of Systems  Constitutional: Positive for malaise/fatigue. Negative for fever, chills, weight loss and diaphoresis.  HENT: Negative.  Negative for congestion, ear discharge, ear pain, hearing loss, nosebleeds, sore throat and tinnitus.   Eyes: Negative for blurred vision, double vision, photophobia, pain, discharge and redness.  Respiratory: Positive for wheezing. Negative for cough, hemoptysis, sputum production, shortness of breath and stridor.        Occasional wheezing with walking   Cardiovascular: Negative for chest pain, palpitations, orthopnea, claudication, leg swelling and PND.  Gastrointestinal: Negative for heartburn, nausea, vomiting, abdominal pain, diarrhea, constipation, blood in stool and melena.  Genitourinary: Negative.   Musculoskeletal: Positive for back pain. Negative for falls, joint pain, myalgias and neck pain.  Skin: Negative.   Neurological: Negative for dizziness, tingling, focal weakness, seizures, weakness and headaches.  Endo/Heme/Allergies: Does not bruise/bleed easily.  Psychiatric/Behavioral: Negative for depression. The patient is not nervous/anxious and does not have insomnia.      PHYSICAL EXAMINATION: Physical Exam  Vitals  reviewed. Constitutional: He is oriented to person, place, and time and well-developed, well-nourished, and in no distress.  HENT:  Head: Normocephalic and atraumatic.  Mouth/Throat: Oropharynx is clear and moist.  Eyes: Pupils are equal, round, and reactive to light. No scleral icterus.  Neck: Normal range of motion. Neck supple. No JVD present. No tracheal deviation present. No thyromegaly present.  Cardiovascular: Normal rate, regular rhythm, normal heart sounds and intact distal pulses.  Exam reveals no gallop and no friction rub.   No murmur heard. Pulmonary/Chest: Effort normal and breath sounds normal. No respiratory distress. He has no wheezes. He has no rales.  Abdominal: Soft. Bowel sounds  are normal. He exhibits no distension and no mass. There is no tenderness. There is no rebound and no guarding.  Musculoskeletal: Normal range of motion. He exhibits no edema and no tenderness.  No spinal tenderness to palpation   Lymphadenopathy:    He has no cervical adenopathy.    He has no axillary adenopathy.  Neurological: He is alert and oriented to person, place, and time. Gait normal.  Skin: Skin is warm and dry. No rash noted.  Psychiatric: Affect normal.    ECOG PERFORMANCE STATUS: 1 - Symptomatic but completely ambulatory  Blood pressure 124/63, pulse 84, temperature 97.7 F (36.5 C), temperature source Oral, resp. rate 18, height 6' (1.829 m), weight 211 lb 14.4 oz (96.117 kg).  LABORATORY DATA: Lab Results  Component Value Date   WBC 9.9 03/18/2014   HGB 9.0* 03/18/2014   HCT 27.7* 03/18/2014   MCV 89.4 03/18/2014   PLT 259 03/18/2014      Chemistry      Component Value Date/Time   NA 140 03/18/2014 0843   NA 143 01/24/2014 1543   K 4.2 03/18/2014 0843   K 4.1 01/24/2014 1543   CL 107 01/24/2014 1543   CO2 21* 03/18/2014 0843   CO2 21 01/24/2014 1543   BUN 21.8 03/18/2014 0843   BUN 15 01/24/2014 1543   CREATININE 0.9 03/18/2014 0843   CREATININE 0.80 01/24/2014 1543       Component Value Date/Time   CALCIUM 9.6 03/18/2014 0843   CALCIUM 9.1 01/24/2014 1543   ALKPHOS 98 03/18/2014 0843   ALKPHOS 103 01/24/2014 1543   AST 16 03/18/2014 0843   AST 17 01/24/2014 1543   ALT 18 03/18/2014 0843   ALT 17 01/24/2014 1543   BILITOT 0.81 03/18/2014 0843   BILITOT 0.6 01/24/2014 1543       RADIOGRAPHIC STUDIES:  Chest 2 View  01/24/2014   CLINICAL DATA:  Preop for thyroid surgery, also history of prostate carcinoma  EXAM: CHEST  2 VIEW  COMPARISON:  Chest x-ray of 01/07/2014 and CT chest of 01/09/2014  FINDINGS: The questioned nodular lesion in the right lung apex by recent CT of the chest is not well seen by plain film, possibly overlying the medial right clavicle. No focal infiltrate or effusion is seen. Mild cardiomegaly is stable with dual lead permanent pacemaker present. No acute bony abnormality is seen.  IMPRESSION: No active lung disease. Stable mild cardiomegaly with permanent pacemaker.   Electronically Signed   By: Ivar Drape M.D.   On: 01/24/2014 15:45   Ct Head W Wo Contrast  01/29/2014   CLINICAL DATA:  78 year old male with stage IV cancer. Staging. Pacemaker. Subsequent encounter.  EXAM: CT HEAD WITHOUT AND WITH CONTRAST  TECHNIQUE: Contiguous axial images were obtained from the base of the skull through the vertex without and with intravenous contrast  CONTRAST:  75 mL Isovue-300.  COMPARISON:  PET-CT 01/17/2014.  Face CT 08/21/2010.  FINDINGS: No acute or suspicious bone lesion of the calvarium identified. Visualized paranasal sinuses and mastoids are clear. Visualized orbit soft tissues are within normal limits. Visualized scalp soft tissues are within normal limits.  Chronic bulky dural calcifications incidentally noted, including along the tentorium greater on the right. No ventriculomegaly. No acute intracranial hemorrhage identified. No midline shift, mass effect, or evidence of intracranial mass lesion. Mild for age nonspecific white matter hypodensity. No  evidence of cortically based acute infarction identified. Calcified atherosclerosis at the skull base. No abnormal enhancement identified. 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  IMPRESSION: No acute or metastatic intracranial abnormality.   Electronically Signed   By: Lars Pinks M.D.   On: 01/29/2014 15:55   Nm Pet Image Initial (pi) Skull Base To Thigh  01/17/2014   ADDENDUM REPORT: 01/17/2014 14:38  ADDENDUM: The following was unintentionally omitted from the original report:  Mild inflammatory changes along a prominent sigmoid diverticulum in the left pelvis (series 4/image 182), with associated mild hypermetabolism (PET image 178), correlate for signs/symptoms of mild sigmoid diverticulitis.   Electronically Signed   By: Julian Hy M.D.   On: 01/17/2014 14:38   01/17/2014   CLINICAL DATA:  Initial treatment strategy for lung nodule. History of prostate cancer with brachytherapy seeds.  EXAM: NUCLEAR MEDICINE PET SKULL BASE TO THIGH  TECHNIQUE: 13.0 mCi F-18 FDG was injected intravenously. Full-ring PET imaging was performed from the skull base to thigh after the radiotracer. CT data was obtained and used for attenuation correction and anatomic localization.  FASTING BLOOD GLUCOSE:  Value: 112 mg/dl  COMPARISON:  CT chest dated 01/09/2014. Bone scan dated 07/02/2013. CT abdomen pelvis dated 07/03/2011.  FINDINGS: NECK  6 mm short axis high right supraclavicular node (series 4/ image 45), max SUV 4.3.  CHEST  14 x 12 mm spiculated posterior right upper lobe nodule (series 8/image 17), corresponding to the CT abnormality, max SUV 1.6. While technically indeterminate, given the associated findings, this appearance is worrisome for primary bronchogenic neoplasm.  Evaluation of lung parenchyma is constrained by respiratory motion. Underlying emphysematous changes. No pleural effusion or pneumothorax.  Widespread thoracic lymphadenopathy, including:  --10 mm short  axis right supraclavicular/level 7 node series 4/ image 50), max SUV 14.1  --8 mm short axis node at the left thoracic inlet (series 4/ image 49), max SUV 11.8  --8 mm short axis high right paratracheal node (series 4/image 58), max SUV 13.4  --1.7 cm short axis right prevascular node (series 4/ image 66), max SUV 13.9  --1.8 cm short axis right paratracheal node (series 4/image 56), max SUV 13.4  --1.8 cm short axis right hilar node (series 4/ image 71), max SUV 15.8  The heart is normal in size. Left subclavian pacemaker. Coronary atherosclerosis.  ABDOMEN/PELVIS  1.6 x 1.1 cm right adrenal nodule, max SUV 8.7, suspicious for metastasis.  No abnormal hypermetabolic activity within the liver, pancreas, or spleen.  No hypermetabolic lymph nodes in the abdomen or pelvis.  Brachytherapy seeds in the prostate.  Small fat containing left inguinal hernia. Vascular calcifications. 3.1 x 3.4 cm infrarenal abdominal aortic aneurysm.  SKELETON  18 mm lytic lesion in the right L5 vertebral body, max SUV 10.5. Additional lytic lesion in the right sacrum (series 4/ image 164), max SUV 8.8.  IMPRESSION: Mildly hypermetabolic spiculated nodule in the posterior right upper lobe, suspicious for primary bronchogenic neoplasm.  Extensive thoracic lymphadenopathy, including a hypermetabolic right supraclavicular node. Consider percutaneous biopsy of this node for tissue confirmation.  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  Osseous metastases involving the right L5 vertebral body and right sacrum.  Electronically Signed: By: Julian Hy M.D. On: 01/17/2014 09:55     ASSESSMENT/PLAN:   Nicholas Schmitt is doing well today.  He is a 78 year old man with recently diagnosed stage IV (T1A, N3, M1B) non small cell lung adenocarcinoma diagnosed in August, 2015.  He is seen prior to receiving cycle 3 day 1 of Carboplatin and Alimta.  He is doing well today and his  labs are stable.  He will proceed with chemotherapy today.  I reviewed  his weekly lab work with Dr. Earlie Server, and his chemotherapy dosage will remain the same.    Nicholas Schmitt's pain is improved since starting chemotherapy.  He will continue to take Oxycodone or Aleve as he has been.    Nicholas Schmitt will return weekly for lab checks, and then a CT chest, abdomen and pelvis.  He will f/u with Dr. Earlie Server on 04/08/2014 for labs, an office visit and cycle 4 of Carboplatin and Alimta.    I reviewed the above plan in detail with Nicholas Schmitt and his wife.  They verbalized understanding and are in agreement with it.   They know to call us in the interim for any questions or concerns.  We can certainly see him sooner if needed.  I spent 25 minutes counseling the patient face to face.  The total time spent in the appointment was 30 minutes.  Minette [place], [place] [phone]

## 2014-03-18 NOTE — Telephone Encounter (Signed)
Per staff message and POF I have scheduled appts. Advised scheduler of appts. JMW  

## 2014-03-18 NOTE — Patient Instructions (Signed)
You are doing well today.  Your labs are good.  You will receive your chemotherapy.  You will undergo a CT scan of your chest/abdomen/pelvis within the next few weeks and see Dr. Earlie Server on 04/08/2014.  You will continue to return every Tuesday for lab work.  Please call us if you have any questions or concerns.

## 2014-03-18 NOTE — Telephone Encounter (Signed)
sch pt appt-adv CS will call to sch CT-gave pt contrast-sent MW emailt o sch trmt-pt will get updated copy b4 leaving trmt room

## 2014-03-18 NOTE — Patient Instructions (Signed)
[facility] Discharge Instructions for Patients Receiving Chemotherapy  Today you received the following chemotherapy agents alimta, carboplatin  To help prevent nausea and vomiting after your treatment, we encourage you to take your nausea medication as needed   If you develop nausea and vomiting that is not controlled by your nausea medication, call the clinic.   BELOW ARE SYMPTOMS THAT SHOULD BE REPORTED IMMEDIATELY:  *FEVER GREATER THAN 100.5 F  *CHILLS WITH OR WITHOUT FEVER  NAUSEA AND VOMITING THAT IS NOT CONTROLLED WITH YOUR NAUSEA MEDICATION  *UNUSUAL SHORTNESS OF BREATH  *UNUSUAL BRUISING OR BLEEDING  TENDERNESS IN MOUTH AND THROAT WITH OR WITHOUT PRESENCE OF ULCERS  *URINARY PROBLEMS  *BOWEL PROBLEMS  UNUSUAL RASH Items with * indicate a potential emergency and should be followed up as soon as possible.  Feel free to call the clinic you have any questions or concerns. The clinic phone number is [phone].

## 2014-03-20 ENCOUNTER — Telehealth: Payer: Self-pay | Admitting: Adult Health

## 2014-03-20 NOTE — Telephone Encounter (Signed)
per pof to sch pt-sent to MM & LC to advise on appt on 10/27-awaiting reply

## 2014-03-24 ENCOUNTER — Telehealth: Payer: Self-pay | Admitting: Internal Medicine

## 2014-03-24 ENCOUNTER — Other Ambulatory Visit (HOSPITAL_BASED_OUTPATIENT_CLINIC_OR_DEPARTMENT_OTHER): Payer: Medicare Other

## 2014-03-24 ENCOUNTER — Encounter (HOSPITAL_COMMUNITY): Payer: Self-pay

## 2014-03-24 ENCOUNTER — Ambulatory Visit (HOSPITAL_COMMUNITY)
Admission: RE | Admit: 2014-03-24 | Discharge: 2014-03-24 | Disposition: A | Discharge status: Home / Self Care | Payer: Medicare Other | Source: Ambulatory Visit | Attending: Adult Health | Admitting: Adult Health

## 2014-03-24 DIAGNOSIS — C3411 Malignant neoplasm of upper lobe, right bronchus or lung: Secondary | ICD-10-CM

## 2014-03-24 DIAGNOSIS — C3490 Malignant neoplasm of unspecified part of unspecified bronchus or lung: Secondary | ICD-10-CM | POA: Diagnosis present

## 2014-03-24 DIAGNOSIS — C3491 Malignant neoplasm of unspecified part of right bronchus or lung: Secondary | ICD-10-CM

## 2014-03-24 DIAGNOSIS — C7970 Secondary malignant neoplasm of unspecified adrenal gland: Secondary | ICD-10-CM

## 2014-03-24 LAB — CBC WITH DIFFERENTIAL/PLATELET
BASO%: 0.7 % (ref 0.0–2.0)
Basophils Absolute: 0 10*3/uL (ref 0.0–0.1)
EOS%: 1.9 % (ref 0.0–7.0)
Eosinophils Absolute: 0.1 10*3/uL (ref 0.0–0.5)
HCT: 29.9 % — ABNORMAL LOW (ref 38.4–49.9)
HGB: 9.7 g/dL — ABNORMAL LOW (ref 13.0–17.1)
LYMPH#: 1.1 10*3/uL (ref 0.9–3.3)
LYMPH%: 40.1 % (ref 14.0–49.0)
MCH: 29 pg (ref 27.2–33.4)
MCHC: 32.4 g/dL (ref 32.0–36.0)
MCV: 89.3 fL (ref 79.3–98.0)
MONO#: 0.2 10*3/uL (ref 0.1–0.9)
MONO%: 6.7 % (ref 0.0–14.0)
NEUT#: 1.4 10*3/uL — ABNORMAL LOW (ref 1.5–6.5)
NEUT%: 50.6 % (ref 39.0–75.0)
Platelets: 230 10*3/uL (ref 140–400)
RBC: 3.35 10*6/uL — ABNORMAL LOW (ref 4.20–5.82)
RDW: 18.4 % — AB (ref 11.0–14.6)
WBC: 2.7 10*3/uL — AB (ref 4.0–10.3)

## 2014-03-24 LAB — COMPREHENSIVE METABOLIC PANEL (CC13)
ALT: 27 U/L (ref 0–55)
AST: 20 U/L (ref 5–34)
Albumin: 3.5 g/dL (ref 3.5–5.0)
Alkaline Phosphatase: 102 U/L (ref 40–150)
Anion Gap: 9 meq/L (ref 3–11)
BUN: 20.5 mg/dL (ref 7.0–26.0)
CO2: 23 meq/L (ref 22–29)
Calcium: 9.7 mg/dL (ref 8.4–10.4)
Chloride: 107 meq/L (ref 98–109)
Creatinine: 0.8 mg/dL (ref 0.7–1.3)
Glucose: 95 mg/dL (ref 70–140)
Potassium: 4.2 meq/L (ref 3.5–5.1)
Sodium: 139 meq/L (ref 136–145)
Total Bilirubin: 1.43 mg/dL — ABNORMAL HIGH (ref 0.20–1.20)
Total Protein: 7 g/dL (ref 6.4–8.3)

## 2014-03-24 MED ORDER — IOHEXOL 300 MG/ML  SOLN
100.0000 mL | Freq: Once | INTRAMUSCULAR | Status: AC | PRN
  Administered 2014-03-24: 100 mL via INTRAVENOUS

## 2014-03-24 NOTE — Telephone Encounter (Signed)
per reply from MM to sch w/Aj on 10/27 or 10/29 AJ had opening on 10/29-will call pt with appt time & date-will ck w/Melissa first before calling pt

## 2014-03-25 ENCOUNTER — Other Ambulatory Visit: Payer: Medicare Other

## 2014-03-26 ENCOUNTER — Telehealth: Payer: Self-pay | Admitting: Adult Health

## 2014-03-26 ENCOUNTER — Other Ambulatory Visit (HOSPITAL_COMMUNITY): Payer: Self-pay | Admitting: *Deleted

## 2014-03-26 DIAGNOSIS — I714 Abdominal aortic aneurysm, without rupture, unspecified: Secondary | ICD-10-CM

## 2014-03-26 NOTE — Telephone Encounter (Signed)
per reply from MM to scg w/ another APP-printed & gave to [facility]

## 2014-03-27 ENCOUNTER — Telehealth: Payer: Self-pay | Admitting: Internal Medicine

## 2014-03-27 ENCOUNTER — Other Ambulatory Visit (HOSPITAL_COMMUNITY): Payer: Self-pay | Admitting: *Deleted

## 2014-03-27 DIAGNOSIS — I6523 Occlusion and stenosis of bilateral carotid arteries: Secondary | ICD-10-CM

## 2014-03-27 NOTE — Telephone Encounter (Signed)
no availbaility for f/u w/MM 10/27. per response from MM any APP. pt scheduled for f/u w/AJ 10/23. per AJ lab will remain 10/20 (weekly) and tx will remain 10/27 w/lab - providers aware. lmonvm for pt re next appts for 10/20 and 10/23. schedule mailed.

## 2014-03-27 NOTE — Telephone Encounter (Signed)
pt called and wants labs same day as MD...ok and aware

## 2014-04-01 ENCOUNTER — Other Ambulatory Visit: Payer: Medicare Other

## 2014-04-04 ENCOUNTER — Other Ambulatory Visit (HOSPITAL_BASED_OUTPATIENT_CLINIC_OR_DEPARTMENT_OTHER): Payer: Medicare Other

## 2014-04-04 ENCOUNTER — Encounter: Payer: Self-pay | Admitting: Physician Assistant

## 2014-04-04 ENCOUNTER — Ambulatory Visit (HOSPITAL_BASED_OUTPATIENT_CLINIC_OR_DEPARTMENT_OTHER): Payer: Medicare Other | Admitting: Physician Assistant

## 2014-04-04 VITALS — BP 122/66 | HR 81 | Temp 98.8°F | Resp 20 | Ht 72.0 in | Wt 206.5 lb

## 2014-04-04 DIAGNOSIS — C3492 Malignant neoplasm of unspecified part of left bronchus or lung: Secondary | ICD-10-CM

## 2014-04-04 DIAGNOSIS — C3411 Malignant neoplasm of upper lobe, right bronchus or lung: Secondary | ICD-10-CM

## 2014-04-04 DIAGNOSIS — N189 Chronic kidney disease, unspecified: Secondary | ICD-10-CM

## 2014-04-04 DIAGNOSIS — C3491 Malignant neoplasm of unspecified part of right bronchus or lung: Secondary | ICD-10-CM

## 2014-04-04 DIAGNOSIS — I2510 Atherosclerotic heart disease of native coronary artery without angina pectoris: Secondary | ICD-10-CM

## 2014-04-04 DIAGNOSIS — C7970 Secondary malignant neoplasm of unspecified adrenal gland: Secondary | ICD-10-CM

## 2014-04-04 DIAGNOSIS — I10 Essential (primary) hypertension: Secondary | ICD-10-CM

## 2014-04-04 LAB — COMPREHENSIVE METABOLIC PANEL (CC13)
ALT: 21 U/L (ref 0–55)
AST: 19 U/L (ref 5–34)
Albumin: 3.2 g/dL — ABNORMAL LOW (ref 3.5–5.0)
Alkaline Phosphatase: 99 U/L (ref 40–150)
Anion Gap: 9 mEq/L (ref 3–11)
BILIRUBIN TOTAL: 1.4 mg/dL — AB (ref 0.20–1.20)
BUN: 21.1 mg/dL (ref 7.0–26.0)
CALCIUM: 9.6 mg/dL (ref 8.4–10.4)
CHLORIDE: 107 meq/L (ref 98–109)
CO2: 22 meq/L (ref 22–29)
Creatinine: 1 mg/dL (ref 0.7–1.3)
GLUCOSE: 100 mg/dL (ref 70–140)
Potassium: 4.9 mEq/L (ref 3.5–5.1)
Sodium: 139 mEq/L (ref 136–145)
Total Protein: 6.3 g/dL — ABNORMAL LOW (ref 6.4–8.3)

## 2014-04-04 LAB — CBC WITH DIFFERENTIAL/PLATELET
BASO%: 0.3 % (ref 0.0–2.0)
BASOS ABS: 0 10*3/uL (ref 0.0–0.1)
EOS%: 5.9 % (ref 0.0–7.0)
Eosinophils Absolute: 0.2 10*3/uL (ref 0.0–0.5)
HEMATOCRIT: 24.6 % — AB (ref 38.4–49.9)
HGB: 8.2 g/dL — ABNORMAL LOW (ref 13.0–17.1)
LYMPH#: 1 10*3/uL (ref 0.9–3.3)
LYMPH%: 28.2 % (ref 14.0–49.0)
MCH: 30.1 pg (ref 27.2–33.4)
MCHC: 33.3 g/dL (ref 32.0–36.0)
MCV: 90.4 fL (ref 79.3–98.0)
MONO#: 0.6 10*3/uL (ref 0.1–0.9)
MONO%: 16.8 % — ABNORMAL HIGH (ref 0.0–14.0)
NEUT#: 1.8 10*3/uL (ref 1.5–6.5)
NEUT%: 48.8 % (ref 39.0–75.0)
Platelets: 129 10*3/uL — ABNORMAL LOW (ref 140–400)
RBC: 2.72 10*6/uL — ABNORMAL LOW (ref 4.20–5.82)
RDW: 20.8 % — ABNORMAL HIGH (ref 11.0–14.6)
WBC: 3.6 10*3/uL — AB (ref 4.0–10.3)

## 2014-04-04 NOTE — Progress Notes (Signed)
No images are attached to the encounter. No scans are attached to the encounter. No scans are attached to the encounter. [name]  [name], MD [place] [id number]  DIAGNOSIS: Non-small cell carcinoma of lung, stage 4   Primary site: Lung (Right)   Staging method: AJCC 7th Edition   Clinical: Stage IV (T1a, N3, M1b) signed by Curt Bears, MD on 02/01/2014  1:42 PM   Summary: Stage IV (T1a, N3, M1b) Prostate cancer   Primary site: Prostate   Clinical: Stage I (T1c, NX, M0) signed by Wyatt Portela, MD on 08/06/2013  1:59 PM   Summary: Stage I (T1c, NX, M0)  PRIOR THERAPY: None  CURRENT THERAPY: Systemic chemotherapy with carboplatin for an AUC of 5 and Alimta 500 mg per meter squared given every 3 weeks. Status post 3 cycles  INTERVAL HISTORY: Nicholas Schmitt 78 y.o. male returns for a skin regular symptom management visit for followup of his recently diagnosed stage IV non-small cell lung cancer, adenocarcinoma. He status post 3 cycles of systemic chemotherapy with carboplatin and Alimta. Overall he tolerated the chemotherapy without difficulty. He denied any fever, chills, nausea or vomiting. He's had no diarrhea. He has had some constipation however this is been a long-standing issue for him. His appetite and energy level both remain good. He voiced no specific complaints today. He continues to be able to play golf.   MEDICAL HISTORY: Past Medical History  Diagnosis Date  . Hypertension   . CAD (coronary artery disease)     positive stress test in 2006 led to left heart cath (8/06) showing 95% prox RCA, 90% CFX, and 90% mLAD.  patient had a cypher DES to all 3 lesions  . Hyperlipidemia   . Hearing loss   . AAA (abdominal aortic aneurysm) 11/09    3.2 cm   . Chronic renal insufficiency   . History of radiation therapy 03/08/04- 04/08/04    prostate 4600 cGy in 3 fractions, radioactive seed implant  05/04/04  . Myocardial infarction 1995  . Pacemaker 2014  . Dysrhythmia   . Shortness of breath     exertion  . Asthma     bronchial  . COPD (chronic obstructive pulmonary disease)   . Cancer 2015    prostate   . Prostate cancer 2005    gleason 7  . Skin cancer     ear    ALLERGIES:  has No Known Allergies.  MEDICATIONS:  Current Outpatient Prescriptions  Medication Sig Dispense Refill  . albuterol (PROVENTIL,VENTOLIN) 90 MCG/ACT inhaler Inhale 2 puffs into the lungs every 6 (six) hours as needed for wheezing or shortness of breath.      [name] amLODipine (NORVASC) 10 MG tablet Take 1 tablet (10 mg total) by mouth daily.  100 tablet  3  . atorvastatin (LIPITOR) 40 MG tablet Take 1 tablet (40 mg total) by mouth daily.  100 tablet  3  . budesonide-formoterol (SYMBICORT) 160-4.5 MCG/ACT inhaler Inhale 1 puff into the lungs as needed (shortness of breath).  4 Inhaler  3  . clopidogrel (PLAVIX) 75 MG tablet Take 75 mg by mouth daily.       [name] dexamethasone (DECADRON) 4 MG tablet 4 mg by mouth twice a day the day before, day of and day after the chemotherapy every 3 weeks.  40 tablet  1  . folic acid (FOLVITE) 1 MG tablet Take 1 tablet (1 mg total) by  mouth daily.  30 tablet  3  . losartan (COZAAR) 100 MG tablet Take 1 tablet (100 mg total) by mouth daily.  100 tablet  3  . Multiple Minerals (CALCIUM/MAGNESIUM/ZINC PO) Take 1 capsule by mouth every evening.      . Multiple Vitamin (MULTIVITAMIN) tablet Take 1 tablet by mouth daily.       . naproxen sodium (ANAPROX) 220 MG tablet Take 220 mg by mouth as needed (pain).       . nitroGLYCERIN (NITROSTAT) 0.4 MG SL tablet Place 0.4 mg under the tongue every 5 (five) minutes as needed for chest pain (MAX 3 TABLETS).      [name] oxyCODONE (OXY IR/ROXICODONE) 5 MG immediate release tablet Take 1-2 tablets (5-10 mg total) by mouth every 4 (four) hours as needed for moderate pain.  30 tablet  0  . prochlorperazine (COMPAZINE) 10 MG tablet Take 1 tablet (10  mg total) by mouth every 6 (six) hours as needed for nausea or vomiting.  30 tablet  0   No current facility-administered medications for this visit.    SURGICAL HISTORY:  Past Surgical History  Procedure Laterality Date  . Ptca  2006  . Coronary stent placement    . Permanent pacemaker insertion Bilateral 02/26/13    MDT Adapta L implanted by Dr Rayann Heman for sick sinus syndrome  . Radioactive seed implant  05/04/2004    8000 cGy, Dr Danny Lawless Dr Reece Agar  . Elbow surgery Bilateral 1973  . Insert / replace / remove pacemaker    . Coronary angioplasty    . Mediastinoscopy N/A 01/28/2014    Procedure: MEDIASTINOSCOPY;  Surgeon: Melrose Nakayama, MD;  Location: [facility];  Service: Thoracic;  Laterality: N/A;    REVIEW OF SYSTEMS:  Review of Systems  Constitutional: Negative for fever, chills, weight loss, malaise/fatigue and diaphoresis.  HENT: Negative for congestion, ear discharge, ear pain, hearing loss, nosebleeds, sore throat and tinnitus.   Eyes: Negative for blurred vision, double vision, photophobia, pain, discharge and redness.  Respiratory: Negative for cough, hemoptysis, sputum production, shortness of breath, wheezing and stridor.   Cardiovascular: Negative for chest pain, palpitations, orthopnea, claudication, leg swelling and PND.  Gastrointestinal: Positive for constipation. Negative for heartburn, nausea, vomiting, abdominal pain, diarrhea, blood in stool and melena.  Genitourinary: Negative.   Musculoskeletal: Negative.   Skin: Negative.   Neurological: Negative for dizziness, tingling, focal weakness, seizures, weakness and headaches.  Endo/Heme/Allergies: Does not bruise/bleed easily.  Psychiatric/Behavioral: Negative for depression. The patient is not nervous/anxious and does not have insomnia.      PHYSICAL EXAMINATION: Physical Exam  Constitutional: He is oriented to person, place, and time and well-developed, well-nourished, and in no distress.  HENT:  Head:  Normocephalic and atraumatic.  Mouth/Throat: Oropharynx is clear and moist.  Eyes: Pupils are equal, round, and reactive to light.  Neck: Normal range of motion. Neck supple. No JVD present. No tracheal deviation present. No thyromegaly present.  Cardiovascular: Normal rate, regular rhythm, normal heart sounds and intact distal pulses.  Exam reveals no gallop and no friction rub.   No murmur heard. Pulmonary/Chest: Effort normal and breath sounds normal. No respiratory distress. He has no wheezes. He has no rales.  Abdominal: Soft. Bowel sounds are normal. He exhibits no distension and no mass. There is no tenderness.  Musculoskeletal: Normal range of motion. He exhibits no edema and no tenderness.  Lymphadenopathy:    He has no cervical adenopathy.  Neurological: He is alert and oriented to  person, place, and time. He has normal reflexes. Gait normal.  Skin: Skin is warm and dry. No rash noted.    ECOG PERFORMANCE STATUS: 1 - Symptomatic but completely ambulatory  Blood pressure 122/66, pulse 81, temperature 98.8 F (37.1 C), temperature source Oral, resp. rate 20, height 6' (1.829 m), weight 206 lb 8 oz (93.668 kg), SpO2 98.00%.  LABORATORY DATA: Lab Results  Component Value Date   WBC 3.6* 04/04/2014   HGB 8.2* 04/04/2014   HCT 24.6* 04/04/2014   MCV 90.4 04/04/2014   PLT 129* 04/04/2014      Chemistry      Component Value Date/Time   NA 139 04/04/2014 1412   NA 143 01/24/2014 1543   K 4.9 04/04/2014 1412   K 4.1 01/24/2014 1543   CL 107 01/24/2014 1543   CO2 22 04/04/2014 1412   CO2 21 01/24/2014 1543   BUN 21.1 04/04/2014 1412   BUN 15 01/24/2014 1543   CREATININE 1.0 04/04/2014 1412   CREATININE 0.80 01/24/2014 1543      Component Value Date/Time   CALCIUM 9.6 04/04/2014 1412   CALCIUM 9.1 01/24/2014 1543   ALKPHOS 99 04/04/2014 1412   ALKPHOS 103 01/24/2014 1543   AST 19 04/04/2014 1412   AST 17 01/24/2014 1543   ALT 21 04/04/2014 1412   ALT 17 01/24/2014 1543    BILITOT 1.40* 04/04/2014 1412   BILITOT 0.6 01/24/2014 1543       RADIOGRAPHIC STUDIES: Ct Chest W Contrast  03/24/2014   CLINICAL DATA:  Non-small cell carcinoma of lung, stage 4, unspecified laterality C34.90 (ICD-10-CM), metastatic lung cancer, evaluate response to treatment.  EXAM: CT CHEST, ABDOMEN, AND PELVIS WITH CONTRAST  TECHNIQUE: Multidetector CT imaging of the chest, abdomen and pelvis was performed following the standard protocol during bolus administration of intravenous contrast.  CONTRAST:  115mL OMNIPAQUE IOHEXOL 300 MG/ML  SOLN  COMPARISON:  PET 01/17/2014 and CT chest 01/09/2014. CT abdomen pelvis 07/02/2013.  FINDINGS: CT CHEST FINDINGS  Mediastinal lymph nodes measure up to 2.0 cm in the prevascular space (previously 2.5 cm) and 2.1 cm in the lower right paratracheal station (previously 2.5 cm). Right hilar adenopathy measures up to 11 mm (previously 2.3 cm). No left hilar or axillary adenopathy. No subcarinal adenopathy. Extensive 3 vessel coronary artery calcification. Pacemaker lead tips terminate in the right atrium and right ventricle. Heart size normal. No pericardial effusion. Tiny hiatal hernia.  Apical segment right upper lobe nodules measure up to 1.0 x 1.5 cm (previously 1.2 x 1.7 cm). Note is made that image quality is somewhat degraded by respiratory motion. Bronchiectasis and scarring in the right middle lobe, medially. Mild subpleural scarring in the right lower lobe. 4 mm left lower lobe nodule (series 5, image 30), unchanged. No pleural fluid. Airway is otherwise unremarkable.  CT ABDOMEN AND PELVIS FINDINGS  Hepatobiliary: Low-attenuation lesions in the liver measure up to 2.0 cm in the dome, as before. Gallbladder is unremarkable. No biliary ductal dilatation.  Pancreas: Negative.  Spleen: Negative.  Adrenals/Urinary Tract: Right adrenal gland thickening or nodule measures 1.1 x 2.1 cm, stable when remeasured on the prior exam. Low-attenuation lesions in the kidneys  measure up to 3.7 cm on the left, as before, indicative of cysts. Ureters are decompressed. Bladder is unremarkable.  Stomach/Bowel: Stomach, small bowel and appendix are otherwise unremarkable. A fair amount of stool is seen in the colon, suggesting constipation. Colon is otherwise unremarkable.  Vascular/Lymphatic: Atherosclerotic calcification of the arterial vasculature. Infrarenal aorta  measures up to 3.3 x 3.4 cm above the bifurcation. No pathologically enlarged lymph nodes.  Reproductive: Brachytherapy seeds are seen in the prostate.  Other: Bilateral inguinal hernia repairs with small fat containing bilateral inguinal hernias. No free fluid. Mesenteries and peritoneum are unremarkable. Fat containing left Bochdalek hernia incidentally noted.  Musculoskeletal: A lucent lesion in the right para midline sacrum measures 1.7 x 2.2 cm (series 2, image 97), increased from 7 x 9 mm on 01/17/2014. A lytic lesion in the right L5 vertebral body measures 1.6 cm (series 2, image 90) is stable. Old anterior left rib fracture.  IMPRESSION: 1. Interval response to therapy as evidenced by improvement in mediastinal and right hilar adenopathy as well as slight decrease in size of a right upper lobe nodule. 2. Sacral metastasis appears larger. Right L5 metastasis is unchanged. 3. Right adrenal thickening or nodule, stable. 4. Extensive 3 vessel coronary artery calcification. 5. Infrarenal aortic aneurysm. 6. Bilateral inguinal hernia repairs with small fat containing bilateral inguinal hernias.   Electronically Signed   By: Lorin Picket M.D.   On: 03/24/2014 10:02   Ct Abdomen Pelvis W Contrast  03/24/2014   CLINICAL DATA:  Non-small cell carcinoma of lung, stage 4, unspecified laterality C34.90 (ICD-10-CM), metastatic lung cancer, evaluate response to treatment.  EXAM: CT CHEST, ABDOMEN, AND PELVIS WITH CONTRAST  TECHNIQUE: Multidetector CT imaging of the chest, abdomen and pelvis was performed following the standard  protocol during bolus administration of intravenous contrast.  CONTRAST:  133mL OMNIPAQUE IOHEXOL 300 MG/ML  SOLN  COMPARISON:  PET 01/17/2014 and CT chest 01/09/2014. CT abdomen pelvis 07/02/2013.  FINDINGS: CT CHEST FINDINGS  Mediastinal lymph nodes measure up to 2.0 cm in the prevascular space (previously 2.5 cm) and 2.1 cm in the lower right paratracheal station (previously 2.5 cm). Right hilar adenopathy measures up to 11 mm (previously 2.3 cm). No left hilar or axillary adenopathy. No subcarinal adenopathy. Extensive 3 vessel coronary artery calcification. Pacemaker lead tips terminate in the right atrium and right ventricle. Heart size normal. No pericardial effusion. Tiny hiatal hernia.  Apical segment right upper lobe nodules measure up to 1.0 x 1.5 cm (previously 1.2 x 1.7 cm). Note is made that image quality is somewhat degraded by respiratory motion. Bronchiectasis and scarring in the right middle lobe, medially. Mild subpleural scarring in the right lower lobe. 4 mm left lower lobe nodule (series 5, image 30), unchanged. No pleural fluid. Airway is otherwise unremarkable.  CT ABDOMEN AND PELVIS FINDINGS  Hepatobiliary: Low-attenuation lesions in the liver measure up to 2.0 cm in the dome, as before. Gallbladder is unremarkable. No biliary ductal dilatation.  Pancreas: Negative.  Spleen: Negative.  Adrenals/Urinary Tract: Right adrenal gland thickening or nodule measures 1.1 x 2.1 cm, stable when remeasured on the prior exam. Low-attenuation lesions in the kidneys measure up to 3.7 cm on the left, as before, indicative of cysts. Ureters are decompressed. Bladder is unremarkable.  Stomach/Bowel: Stomach, small bowel and appendix are otherwise unremarkable. A fair amount of stool is seen in the colon, suggesting constipation. Colon is otherwise unremarkable.  Vascular/Lymphatic: Atherosclerotic calcification of the arterial vasculature. Infrarenal aorta measures up to 3.3 x 3.4 cm above the bifurcation. No  pathologically enlarged lymph nodes.  Reproductive: Brachytherapy seeds are seen in the prostate.  Other: Bilateral inguinal hernia repairs with small fat containing bilateral inguinal hernias. No free fluid. Mesenteries and peritoneum are unremarkable. Fat containing left Bochdalek hernia incidentally noted.  Musculoskeletal: A lucent lesion in the right  para midline sacrum measures 1.7 x 2.2 cm (series 2, image 97), increased from 7 x 9 mm on 01/17/2014. A lytic lesion in the right L5 vertebral body measures 1.6 cm (series 2, image 90) is stable. Old anterior left rib fracture.  IMPRESSION: 1. Interval response to therapy as evidenced by improvement in mediastinal and right hilar adenopathy as well as slight decrease in size of a right upper lobe nodule. 2. Sacral metastasis appears larger. Right L5 metastasis is unchanged. 3. Right adrenal thickening or nodule, stable. 4. Extensive 3 vessel coronary artery calcification. 5. Infrarenal aortic aneurysm. 6. Bilateral inguinal hernia repairs with small fat containing bilateral inguinal hernias.   Electronically Signed   By: Lorin Picket M.D.   On: 03/24/2014 10:02     ASSESSMENT/PLAN:  No problem-specific assessment & plan notes found for this encounter.  Patient was discussed with and also seen by Dr. Julien Nordmann. Patient is a pleasant 78 year old male with stage IV (T1 A., N3, M1 B.) non-small cell lung adenocarcinoma diagnosed in August of 2015. He is status post 3 cycles of systemic chemotherapy with carboplatin for an AUC of 5 and Alimta 400 mg per meter squared given every 3 weeks. His recent restaging CT scan of the chest, abdomen and pelvis with contrast revealed interval improvement in his disease. His results reviewed with patient. He will continue with cycle #4 as scheduled as well as weekly labs as scheduled. He'll followup in 3 weeks prior to cycle #5. Awilda Metro E, PA-C 04/04/2014   All questions were answered. The patient knows to call  the clinic with any problems, questions or concerns. We can certainly see the patient much sooner if necessary.

## 2014-04-06 NOTE — Patient Instructions (Signed)
Your restaging CT scan revealed improvement in your disease Continue labs and chemotherapy as scheduled Followup in 3 weeks

## 2014-04-08 ENCOUNTER — Other Ambulatory Visit (HOSPITAL_BASED_OUTPATIENT_CLINIC_OR_DEPARTMENT_OTHER): Payer: Medicare Other

## 2014-04-08 ENCOUNTER — Other Ambulatory Visit: Payer: Self-pay | Admitting: Hematology and Oncology

## 2014-04-08 ENCOUNTER — Ambulatory Visit (HOSPITAL_BASED_OUTPATIENT_CLINIC_OR_DEPARTMENT_OTHER): Payer: Medicare Other

## 2014-04-08 VITALS — BP 132/66 | HR 82 | Temp 97.7°F | Resp 18

## 2014-04-08 DIAGNOSIS — C3411 Malignant neoplasm of upper lobe, right bronchus or lung: Secondary | ICD-10-CM

## 2014-04-08 DIAGNOSIS — C3491 Malignant neoplasm of unspecified part of right bronchus or lung: Secondary | ICD-10-CM

## 2014-04-08 DIAGNOSIS — Z5111 Encounter for antineoplastic chemotherapy: Secondary | ICD-10-CM

## 2014-04-08 DIAGNOSIS — C7970 Secondary malignant neoplasm of unspecified adrenal gland: Secondary | ICD-10-CM

## 2014-04-08 LAB — CBC WITH DIFFERENTIAL/PLATELET
BASO%: 0.2 % (ref 0.0–2.0)
BASOS ABS: 0 10*3/uL (ref 0.0–0.1)
EOS ABS: 0 10*3/uL (ref 0.0–0.5)
EOS%: 0 % (ref 0.0–7.0)
HEMATOCRIT: 27.3 % — AB (ref 38.4–49.9)
HEMOGLOBIN: 8.8 g/dL — AB (ref 13.0–17.1)
LYMPH%: 11.2 % — ABNORMAL LOW (ref 14.0–49.0)
MCH: 29.6 pg (ref 27.2–33.4)
MCHC: 32.4 g/dL (ref 32.0–36.0)
MCV: 91.3 fL (ref 79.3–98.0)
MONO#: 0.2 10*3/uL (ref 0.1–0.9)
MONO%: 4.4 % (ref 0.0–14.0)
NEUT%: 84.2 % — AB (ref 39.0–75.0)
NEUTROS ABS: 4.5 10*3/uL (ref 1.5–6.5)
Platelets: 307 10*3/uL (ref 140–400)
RBC: 2.99 10*6/uL — ABNORMAL LOW (ref 4.20–5.82)
RDW: 22.7 % — AB (ref 11.0–14.6)
WBC: 5.4 10*3/uL (ref 4.0–10.3)
lymph#: 0.6 10*3/uL — ABNORMAL LOW (ref 0.9–3.3)

## 2014-04-08 LAB — COMPREHENSIVE METABOLIC PANEL (CC13)
ALT: 22 U/L (ref 0–55)
ANION GAP: 12 meq/L — AB (ref 3–11)
AST: 17 U/L (ref 5–34)
Albumin: 3.5 g/dL (ref 3.5–5.0)
Alkaline Phosphatase: 93 U/L (ref 40–150)
BUN: 18.5 mg/dL (ref 7.0–26.0)
CALCIUM: 10 mg/dL (ref 8.4–10.4)
CHLORIDE: 107 meq/L (ref 98–109)
CO2: 20 meq/L — AB (ref 22–29)
CREATININE: 0.9 mg/dL (ref 0.7–1.3)
Glucose: 133 mg/dl (ref 70–140)
POTASSIUM: 4.1 meq/L (ref 3.5–5.1)
Sodium: 139 mEq/L (ref 136–145)
Total Bilirubin: 0.89 mg/dL (ref 0.20–1.20)
Total Protein: 7 g/dL (ref 6.4–8.3)

## 2014-04-08 MED ORDER — DEXAMETHASONE SODIUM PHOSPHATE 20 MG/5ML IJ SOLN
INTRAMUSCULAR | Status: AC
  Filled 2014-04-08: qty 5

## 2014-04-08 MED ORDER — ONDANSETRON 16 MG/50ML IVPB (CHCC)
16.0000 mg | Freq: Once | INTRAVENOUS | Status: AC
  Administered 2014-04-08: 16 mg via INTRAVENOUS

## 2014-04-08 MED ORDER — SODIUM CHLORIDE 0.9 % IV SOLN
400.0000 mg/m2 | Freq: Once | INTRAVENOUS | Status: AC
  Administered 2014-04-08: 875 mg via INTRAVENOUS
  Filled 2014-04-08: qty 35

## 2014-04-08 MED ORDER — DEXAMETHASONE SODIUM PHOSPHATE 20 MG/5ML IJ SOLN
20.0000 mg | Freq: Once | INTRAMUSCULAR | Status: AC
  Administered 2014-04-08: 20 mg via INTRAVENOUS

## 2014-04-08 MED ORDER — SODIUM CHLORIDE 0.9 % IV SOLN
440.0000 mg | Freq: Once | INTRAVENOUS | Status: AC
  Administered 2014-04-08: 440 mg via INTRAVENOUS
  Filled 2014-04-08: qty 44

## 2014-04-08 MED ORDER — ONDANSETRON 16 MG/50ML IVPB (CHCC)
INTRAVENOUS | Status: AC
  Filled 2014-04-08: qty 16

## 2014-04-08 MED ORDER — SODIUM CHLORIDE 0.9 % IV SOLN
Freq: Once | INTRAVENOUS | Status: AC
  Administered 2014-04-08: 10:00:00 via INTRAVENOUS

## 2014-04-08 NOTE — Patient Instructions (Signed)
[facility] Discharge Instructions for Patients Receiving Chemotherapy  Today you received the following chemotherapy agents: Alimta and Carboplatin.  To help prevent nausea and vomiting after your treatment, we encourage you to take your nausea medication as prescribed.   If you develop nausea and vomiting that is not controlled by your nausea medication, call the clinic.   BELOW ARE SYMPTOMS THAT SHOULD BE REPORTED IMMEDIATELY:  *FEVER GREATER THAN 100.5 F  *CHILLS WITH OR WITHOUT FEVER  NAUSEA AND VOMITING THAT IS NOT CONTROLLED WITH YOUR NAUSEA MEDICATION  *UNUSUAL SHORTNESS OF BREATH  *UNUSUAL BRUISING OR BLEEDING  TENDERNESS IN MOUTH AND THROAT WITH OR WITHOUT PRESENCE OF ULCERS  *URINARY PROBLEMS  *BOWEL PROBLEMS  UNUSUAL RASH Items with * indicate a potential emergency and should be followed up as soon as possible.  Feel free to call the clinic you have any questions or concerns. The clinic phone number is [phone].

## 2014-04-09 ENCOUNTER — Ambulatory Visit (HOSPITAL_BASED_OUTPATIENT_CLINIC_OR_DEPARTMENT_OTHER): Payer: Medicare Other | Admitting: *Deleted

## 2014-04-09 ENCOUNTER — Ambulatory Visit (HOSPITAL_COMMUNITY): Payer: Medicare Other | Attending: Internal Medicine | Admitting: *Deleted

## 2014-04-09 DIAGNOSIS — I714 Abdominal aortic aneurysm, without rupture, unspecified: Secondary | ICD-10-CM

## 2014-04-09 DIAGNOSIS — I6523 Occlusion and stenosis of bilateral carotid arteries: Secondary | ICD-10-CM

## 2014-04-09 DIAGNOSIS — E785 Hyperlipidemia, unspecified: Secondary | ICD-10-CM | POA: Insufficient documentation

## 2014-04-09 DIAGNOSIS — I10 Essential (primary) hypertension: Secondary | ICD-10-CM | POA: Insufficient documentation

## 2014-04-09 DIAGNOSIS — I2510 Atherosclerotic heart disease of native coronary artery without angina pectoris: Secondary | ICD-10-CM | POA: Insufficient documentation

## 2014-04-09 DIAGNOSIS — Z87891 Personal history of nicotine dependence: Secondary | ICD-10-CM | POA: Insufficient documentation

## 2014-04-09 NOTE — Progress Notes (Signed)
Carotid duplex complete 

## 2014-04-09 NOTE — Progress Notes (Signed)
Abdominal Aorta Duplex Complete

## 2014-04-10 ENCOUNTER — Ambulatory Visit: Payer: Medicare Other | Admitting: Physician Assistant

## 2014-04-14 ENCOUNTER — Telehealth: Payer: Self-pay | Admitting: Internal Medicine

## 2014-04-14 NOTE — Telephone Encounter (Signed)
RETURNED PT CALL AND SCHED md VISIT PER PT REQUEST.Marland KitchenMarland KitchenLVM FOR PT WITH D.T

## 2014-04-15 ENCOUNTER — Other Ambulatory Visit (INDEPENDENT_AMBULATORY_CARE_PROVIDER_SITE_OTHER): Payer: Medicare Other

## 2014-04-15 ENCOUNTER — Ambulatory Visit (HOSPITAL_BASED_OUTPATIENT_CLINIC_OR_DEPARTMENT_OTHER): Payer: Medicare Other | Admitting: Internal Medicine

## 2014-04-15 ENCOUNTER — Encounter: Payer: Self-pay | Admitting: Internal Medicine

## 2014-04-15 ENCOUNTER — Telehealth: Payer: Self-pay | Admitting: *Deleted

## 2014-04-15 ENCOUNTER — Other Ambulatory Visit: Payer: Medicare Other

## 2014-04-15 ENCOUNTER — Telehealth: Payer: Self-pay | Admitting: Internal Medicine

## 2014-04-15 VITALS — BP 92/61 | HR 84 | Temp 97.9°F | Resp 18 | Ht 72.0 in | Wt 204.7 lb

## 2014-04-15 DIAGNOSIS — C3491 Malignant neoplasm of unspecified part of right bronchus or lung: Secondary | ICD-10-CM

## 2014-04-15 DIAGNOSIS — Z0000 Encounter for general adult medical examination without abnormal findings: Secondary | ICD-10-CM

## 2014-04-15 DIAGNOSIS — R5383 Other fatigue: Secondary | ICD-10-CM

## 2014-04-15 DIAGNOSIS — C3411 Malignant neoplasm of upper lobe, right bronchus or lung: Secondary | ICD-10-CM

## 2014-04-15 DIAGNOSIS — C61 Malignant neoplasm of prostate: Secondary | ICD-10-CM

## 2014-04-15 LAB — POCT URINALYSIS DIPSTICK
BILIRUBIN UA: NEGATIVE
GLUCOSE UA: NEGATIVE
KETONES UA: NEGATIVE
Leukocytes, UA: NEGATIVE
Nitrite, UA: NEGATIVE
RBC UA: NEGATIVE
SPEC GRAV UA: 1.015
Urobilinogen, UA: 2
pH, UA: 6.5

## 2014-04-15 LAB — CBC WITH DIFFERENTIAL/PLATELET
Basophils Absolute: 0 10*3/uL (ref 0.0–0.1)
Basophils Relative: 0.4 % (ref 0.0–3.0)
EOS PCT: 1.4 % (ref 0.0–5.0)
Eosinophils Absolute: 0 10*3/uL (ref 0.0–0.7)
HCT: 27.5 % — ABNORMAL LOW (ref 39.0–52.0)
Hemoglobin: 9.1 g/dL — ABNORMAL LOW (ref 13.0–17.0)
Lymphocytes Relative: 54.2 % — ABNORMAL HIGH (ref 12.0–46.0)
Lymphs Abs: 1.2 10*3/uL (ref 0.7–4.0)
MCHC: 33 g/dL (ref 30.0–36.0)
MCV: 90.5 fl (ref 78.0–100.0)
Monocytes Absolute: 0.3 10*3/uL (ref 0.1–1.0)
Monocytes Relative: 15.9 % — ABNORMAL HIGH (ref 3.0–12.0)
NEUTROS PCT: 28.1 % — AB (ref 43.0–77.0)
Neutro Abs: 0.6 10*3/uL — ABNORMAL LOW (ref 1.4–7.7)
PLATELETS: 201 10*3/uL (ref 150.0–400.0)
RBC: 3.04 Mil/uL — AB (ref 4.22–5.81)
RDW: 21.9 % — ABNORMAL HIGH (ref 11.5–15.5)
WBC: 2.2 10*3/uL — AB (ref 4.0–10.5)

## 2014-04-15 LAB — HEPATIC FUNCTION PANEL
ALK PHOS: 87 U/L (ref 39–117)
ALT: 24 U/L (ref 0–53)
AST: 24 U/L (ref 0–37)
Albumin: 3.1 g/dL — ABNORMAL LOW (ref 3.5–5.2)
BILIRUBIN DIRECT: 0.3 mg/dL (ref 0.0–0.3)
BILIRUBIN TOTAL: 1.3 mg/dL — AB (ref 0.2–1.2)
Total Protein: 6.4 g/dL (ref 6.0–8.3)

## 2014-04-15 LAB — LIPID PANEL
CHOL/HDL RATIO: 2
Cholesterol: 104 mg/dL (ref 0–200)
HDL: 42.3 mg/dL (ref >39.00)
LDL CALC: 50 mg/dL (ref 0–99)
NonHDL: 61.7
TRIGLYCERIDES: 60 mg/dL (ref 0.0–149.0)
VLDL: 12 mg/dL (ref 0.0–40.0)

## 2014-04-15 LAB — BASIC METABOLIC PANEL
BUN: 19 mg/dL (ref 6–23)
CO2: 26 mEq/L (ref 19–32)
CREATININE: 0.8 mg/dL (ref 0.4–1.5)
Calcium: 9.2 mg/dL (ref 8.4–10.5)
Chloride: 109 mEq/L (ref 96–112)
GFR: 99.89 mL/min (ref >60.00)
Glucose, Bld: 95 mg/dL (ref 70–99)
POTASSIUM: 5 meq/L (ref 3.5–5.1)
Sodium: 141 mEq/L (ref 135–145)

## 2014-04-15 LAB — PSA: PSA: 4.59 ng/mL — ABNORMAL HIGH (ref 0.10–4.00)

## 2014-04-15 LAB — TSH: TSH: 2.84 u[IU]/mL (ref 0.35–4.50)

## 2014-04-15 MED ORDER — OXYCODONE HCL 5 MG PO TABS: 5.0000 mg | ORAL_TABLET | ORAL | Status: DC | PRN

## 2014-04-15 NOTE — Telephone Encounter (Signed)
Gave avs & cal for Nov. Sent Mess to sch tx.

## 2014-04-15 NOTE — Progress Notes (Signed)
Nicholas Schmitt [phone]   [phone]  OFFICE PROGRESS NOTE  [name],[name] [name], MD [place] [address]  DIAGNOSIS: Non-small cell carcinoma of lung, stage 4  Primary site: Lung (Right)  Staging method: AJCC 7th Edition  Clinical: Stage IV (T1a, N3, M1b) signed by Curt Bears, MD on 02/01/2014 1:42 PM  Summary: Stage IV (T1a, N3, M1b) Prostate cancer  Primary site: Prostate  Clinical: Stage I (T1c, NX, M0) signed by Nicholas Portela, MD on 08/06/2013 1:59 PM  Summary: Stage I (T1c, NX, M0)  PRIOR THERAPY: None  CURRENT THERAPY: Systemic chemotherapy with carboplatin for an AUC of 5 and Alimta 500 mg per meter squared given every 3 weeks. Status post 4 cycles  INTERVAL HISTORY: Nicholas Schmitt 78 y.o. male returns to the clinic today for Follow-up visit accompanied by his wife. The patient is feeling fine today with no specific complaints. He is tolerating his current treatment with carboplatin and Alimta fairly well with no significant adverse effects except for mild fatigue. He denied having any significant fever or chills, no nausea or vomiting. The patient denied having any significant chest pain, shortness breath, cough or hemoptysis. He received cycle #4 last week. He had repeat CT scan of the chest, abdomen and pelvis performed recently and he is here for evaluation and discussion of his scan results.  MEDICAL HISTORY: Past Medical History  Diagnosis Date  . Hypertension   . CAD (coronary artery disease)     positive stress test in 2006 led to left heart cath (8/06) showing 95% prox RCA, 90% CFX, and 90% mLAD.  patient had a cypher DES to all 3 lesions  . Hyperlipidemia   . Hearing loss   . AAA (abdominal aortic aneurysm) 11/09    3.2 cm   . Chronic renal insufficiency   . History of radiation therapy 03/08/04- 04/08/04    prostate 4600 cGy in 3 fractions, radioactive seed implant 05/04/04  .  Myocardial infarction 1995  . Pacemaker 2014  . Dysrhythmia   . Shortness of breath     exertion  . Asthma     bronchial  . COPD (chronic obstructive pulmonary disease)   . Cancer 2015    prostate   . Prostate cancer 2005    gleason 7  . Skin cancer     ear    ALLERGIES:  has No Known Allergies.  MEDICATIONS:  Current Outpatient Prescriptions  Medication Sig Dispense Refill  . albuterol (PROVENTIL,VENTOLIN) 90 MCG/ACT inhaler Inhale 2 puffs into the lungs every 6 (six) hours as needed for wheezing or shortness of breath.    [name] amLODipine (NORVASC) 10 MG tablet Take 1 tablet (10 mg total) by mouth daily. 100 tablet 3  . atorvastatin (LIPITOR) 40 MG tablet Take 1 tablet (40 mg total) by mouth daily. 100 tablet 3  . budesonide-formoterol (SYMBICORT) 160-4.5 MCG/ACT inhaler Inhale 1 puff into the lungs as needed (shortness of breath). 4 Inhaler 3  . clopidogrel (PLAVIX) 75 MG tablet Take 75 mg by mouth daily.     [name] dexamethasone (DECADRON) 4 MG tablet 4 mg by mouth twice a day the day before, day of and day after the chemotherapy every 3 weeks. 40 tablet 1  . folic acid (FOLVITE) 1 MG tablet Take 1 tablet (1 mg total) by mouth daily. 30 tablet 3  . losartan (COZAAR) 100 MG tablet Take 1 tablet (100 mg total) by mouth daily. 100  tablet 3  . Multiple Minerals (CALCIUM/MAGNESIUM/ZINC PO) Take 1 capsule by mouth every evening.    . Multiple Vitamin (MULTIVITAMIN) tablet Take 1 tablet by mouth daily.     . naproxen sodium (ANAPROX) 220 MG tablet Take 220 mg by mouth as needed (pain).     [name] oxyCODONE (OXY IR/ROXICODONE) 5 MG immediate release tablet Take 1-2 tablets (5-10 mg total) by mouth every 4 (four) hours as needed for moderate pain. 30 tablet 0  . prochlorperazine (COMPAZINE) 10 MG tablet Take 1 tablet (10 mg total) by mouth every 6 (six) hours as needed for nausea or vomiting. 30 tablet 0  . nitroGLYCERIN (NITROSTAT) 0.4 MG SL tablet Place 0.4 mg under the tongue every 5 (five) minutes  as needed for chest pain (MAX 3 TABLETS).     No current facility-administered medications for this visit.    SURGICAL HISTORY:  Past Surgical History  Procedure Laterality Date  . Ptca  2006  . Coronary stent placement    . Permanent pacemaker insertion Bilateral 02/26/13    MDT Adapta L implanted by Dr Rayann Heman for sick sinus syndrome  . Radioactive seed implant  05/04/2004    8000 cGy, Dr Danny Lawless Dr Reece Agar  . Elbow surgery Bilateral 1973  . Insert / replace / remove pacemaker    . Coronary angioplasty    . Mediastinoscopy N/A 01/28/2014    Procedure: MEDIASTINOSCOPY;  Surgeon: Melrose Nakayama, MD;  Location: [facility];  Service: Thoracic;  Laterality: N/A;    REVIEW OF SYSTEMS:  Constitutional: positive for fatigue Eyes: negative Ears, nose, mouth, throat, and face: negative Respiratory: negative Cardiovascular: negative Gastrointestinal: negative Genitourinary:negative Integument/breast: negative Hematologic/lymphatic: negative Musculoskeletal:negative Neurological: negative Behavioral/Psych: negative Endocrine: negative Allergic/Immunologic: negative   PHYSICAL EXAMINATION: General appearance: alert, cooperative, fatigued and no distress Head: Normocephalic, without obvious abnormality, atraumatic Neck: no adenopathy, no carotid bruit, supple, symmetrical, trachea midline and thyroid not enlarged, symmetric, no tenderness/mass/nodules Lymph nodes: Cervical, supraclavicular, and axillary nodes normal. Resp: clear to auscultation bilaterally Back: symmetric, no curvature. ROM normal. No CVA tenderness. Cardio: regular rate and rhythm, S1, S2 normal, no murmur, click, rub or gallop GI: soft, non-tender; bowel sounds normal; no masses,  no organomegaly Extremities: extremities normal, atraumatic, no cyanosis or edema Neurologic: Alert and oriented X 3, normal strength and tone. Normal symmetric reflexes. Normal coordination and gait  ECOG PERFORMANCE STATUS: 1 -  Symptomatic but completely ambulatory  Blood pressure 92/61, pulse 84, temperature 97.9 F (36.6 C), temperature source Oral, resp. rate 18, height 6' (1.829 m), weight 204 lb 11.2 oz (92.851 kg), SpO2 99 %.  LABORATORY DATA: Lab Results  Component Value Date   WBC 5.4 04/08/2014   HGB 8.8* 04/08/2014   HCT 27.3* 04/08/2014   MCV 91.3 04/08/2014   PLT 307 04/08/2014      Chemistry      Component Value Date/Time   NA 139 04/08/2014 0847   NA 143 01/24/2014 1543   K 4.1 04/08/2014 0847   K 4.1 01/24/2014 1543   CL 107 01/24/2014 1543   CO2 20* 04/08/2014 0847   CO2 21 01/24/2014 1543   BUN 18.5 04/08/2014 0847   BUN 15 01/24/2014 1543   CREATININE 0.9 04/08/2014 0847   CREATININE 0.80 01/24/2014 1543      Component Value Date/Time   CALCIUM 10.0 04/08/2014 0847   CALCIUM 9.1 01/24/2014 1543   ALKPHOS 93 04/08/2014 0847   ALKPHOS 103 01/24/2014 1543   AST 17 04/08/2014 0847  AST 17 01/24/2014 1543   ALT 22 04/08/2014 0847   ALT 17 01/24/2014 1543   BILITOT 0.89 04/08/2014 0847   BILITOT 0.6 01/24/2014 1543       RADIOGRAPHIC STUDIES: Ct Chest W Contrast  03/24/2014   CLINICAL DATA:  Non-small cell carcinoma of lung, stage 4, unspecified laterality C34.90 (ICD-10-CM), metastatic lung cancer, evaluate response to treatment.  EXAM: CT CHEST, ABDOMEN, AND PELVIS WITH CONTRAST  TECHNIQUE: Multidetector CT imaging of the chest, abdomen and pelvis was performed following the standard protocol during bolus administration of intravenous contrast.  CONTRAST:  146mL OMNIPAQUE IOHEXOL 300 MG/ML  SOLN  COMPARISON:  PET 01/17/2014 and CT chest 01/09/2014. CT abdomen pelvis 07/02/2013.  FINDINGS: CT CHEST FINDINGS  Mediastinal lymph nodes measure up to 2.0 cm in the prevascular space (previously 2.5 cm) and 2.1 cm in the lower right paratracheal station (previously 2.5 cm). Right hilar adenopathy measures up to 11 mm (previously 2.3 cm). No left hilar or axillary adenopathy. No  subcarinal adenopathy. Extensive 3 vessel coronary artery calcification. Pacemaker lead tips terminate in the right atrium and right ventricle. Heart size normal. No pericardial effusion. Tiny hiatal hernia.  Apical segment right upper lobe nodules measure up to 1.0 x 1.5 cm (previously 1.2 x 1.7 cm). Note is made that image quality is somewhat degraded by respiratory motion. Bronchiectasis and scarring in the right middle lobe, medially. Mild subpleural scarring in the right lower lobe. 4 mm left lower lobe nodule (series 5, image 30), unchanged. No pleural fluid. Airway is otherwise unremarkable.  CT ABDOMEN AND PELVIS FINDINGS  Hepatobiliary: Low-attenuation lesions in the liver measure up to 2.0 cm in the dome, as before. Gallbladder is unremarkable. No biliary ductal dilatation.  Pancreas: Negative.  Spleen: Negative.  Adrenals/Urinary Tract: Right adrenal gland thickening or nodule measures 1.1 x 2.1 cm, stable when remeasured on the prior exam. Low-attenuation lesions in the kidneys measure up to 3.7 cm on the left, as before, indicative of cysts. Ureters are decompressed. Bladder is unremarkable.  Stomach/Bowel: Stomach, small bowel and appendix are otherwise unremarkable. A fair amount of stool is seen in the colon, suggesting constipation. Colon is otherwise unremarkable.  Vascular/Lymphatic: Atherosclerotic calcification of the arterial vasculature. Infrarenal aorta measures up to 3.3 x 3.4 cm above the bifurcation. No pathologically enlarged lymph nodes.  Reproductive: Brachytherapy seeds are seen in the prostate.  Other: Bilateral inguinal hernia repairs with small fat containing bilateral inguinal hernias. No free fluid. Mesenteries and peritoneum are unremarkable. Fat containing left Bochdalek hernia incidentally noted.  Musculoskeletal: A lucent lesion in the right para midline sacrum measures 1.7 x 2.2 cm (series 2, image 97), increased from 7 x 9 mm on 01/17/2014. A lytic lesion in the right L5  vertebral body measures 1.6 cm (series 2, image 90) is stable. Old anterior left rib fracture.  IMPRESSION: 1. Interval response to therapy as evidenced by improvement in mediastinal and right hilar adenopathy as well as slight decrease in size of a right upper lobe nodule. 2. Sacral metastasis appears larger. Right L5 metastasis is unchanged. 3. Right adrenal thickening or nodule, stable. 4. Extensive 3 vessel coronary artery calcification. 5. Infrarenal aortic aneurysm. 6. Bilateral inguinal hernia repairs with small fat containing bilateral inguinal hernias.   Electronically Signed   By: Lorin Picket M.D.   On: 03/24/2014 10:02   Ct Abdomen Pelvis W Contrast  03/24/2014   CLINICAL DATA:  Non-small cell carcinoma of lung, stage 4, unspecified laterality C34.90 (ICD-10-CM), metastatic lung  cancer, evaluate response to treatment.  EXAM: CT CHEST, ABDOMEN, AND PELVIS WITH CONTRAST  TECHNIQUE: Multidetector CT imaging of the chest, abdomen and pelvis was performed following the standard protocol during bolus administration of intravenous contrast.  CONTRAST:  159mL OMNIPAQUE IOHEXOL 300 MG/ML  SOLN  COMPARISON:  PET 01/17/2014 and CT chest 01/09/2014. CT abdomen pelvis 07/02/2013.  FINDINGS: CT CHEST FINDINGS  Mediastinal lymph nodes measure up to 2.0 cm in the prevascular space (previously 2.5 cm) and 2.1 cm in the lower right paratracheal station (previously 2.5 cm). Right hilar adenopathy measures up to 11 mm (previously 2.3 cm). No left hilar or axillary adenopathy. No subcarinal adenopathy. Extensive 3 vessel coronary artery calcification. Pacemaker lead tips terminate in the right atrium and right ventricle. Heart size normal. No pericardial effusion. Tiny hiatal hernia.  Apical segment right upper lobe nodules measure up to 1.0 x 1.5 cm (previously 1.2 x 1.7 cm). Note is made that image quality is somewhat degraded by respiratory motion. Bronchiectasis and scarring in the right middle lobe, medially.  Mild subpleural scarring in the right lower lobe. 4 mm left lower lobe nodule (series 5, image 30), unchanged. No pleural fluid. Airway is otherwise unremarkable.  CT ABDOMEN AND PELVIS FINDINGS  Hepatobiliary: Low-attenuation lesions in the liver measure up to 2.0 cm in the dome, as before. Gallbladder is unremarkable. No biliary ductal dilatation.  Pancreas: Negative.  Spleen: Negative.  Adrenals/Urinary Tract: Right adrenal gland thickening or nodule measures 1.1 x 2.1 cm, stable when remeasured on the prior exam. Low-attenuation lesions in the kidneys measure up to 3.7 cm on the left, as before, indicative of cysts. Ureters are decompressed. Bladder is unremarkable.  Stomach/Bowel: Stomach, small bowel and appendix are otherwise unremarkable. A fair amount of stool is seen in the colon, suggesting constipation. Colon is otherwise unremarkable.  Vascular/Lymphatic: Atherosclerotic calcification of the arterial vasculature. Infrarenal aorta measures up to 3.3 x 3.4 cm above the bifurcation. No pathologically enlarged lymph nodes.  Reproductive: Brachytherapy seeds are seen in the prostate.  Other: Bilateral inguinal hernia repairs with small fat containing bilateral inguinal hernias. No free fluid. Mesenteries and peritoneum are unremarkable. Fat containing left Bochdalek hernia incidentally noted.  Musculoskeletal: A lucent lesion in the right para midline sacrum measures 1.7 x 2.2 cm (series 2, image 97), increased from 7 x 9 mm on 01/17/2014. A lytic lesion in the right L5 vertebral body measures 1.6 cm (series 2, image 90) is stable. Old anterior left rib fracture.  IMPRESSION: 1. Interval response to therapy as evidenced by improvement in mediastinal and right hilar adenopathy as well as slight decrease in size of a right upper lobe nodule. 2. Sacral metastasis appears larger. Right L5 metastasis is unchanged. 3. Right adrenal thickening or nodule, stable. 4. Extensive 3 vessel coronary artery calcification.  5. Infrarenal aortic aneurysm. 6. Bilateral inguinal hernia repairs with small fat containing bilateral inguinal hernias.   Electronically Signed   By: Lorin Picket M.D.   On: 03/24/2014 10:02    ASSESSMENT AND PLAN: this is a very pleasant 78 years old white male with stage IV non-small cell lung cancer currently undergoing systemic chemotherapy with carboplatin and Alimta status post 4 cycles and tolerating his treatment fairly well. The recent CT scan of the chest, abdomen and pelvis showed evidence for improvement in his disease. I discussed the scan results with the patient and his wife. I recommended for him to continue on systemic chemotherapy with the same regimen. He'll start cycle #5 of  his treatment in 2 weeks. He would come back for follow-up visit at that time. The patient was advised to call immediately if he has any concerning symptoms in the interval. The patient voices understanding of current disease status and treatment options and is in agreement with the current care plan.  All questions were answered. The patient knows to call the clinic with any problems, questions or concerns. We can certainly see the patient much sooner if necessary.  I spent 15 minutes counseling the patient face to face. The total time spent in the appointment was 25 minutes.  Disclaimer: This note was dictated with voice recognition software. Similar sounding words can inadvertently be transcribed and may not be corrected upon review.

## 2014-04-15 NOTE — Telephone Encounter (Signed)
Per staff message and POF I have scheduled appts. Advised scheduler of appts. JMW  

## 2014-04-16 ENCOUNTER — Other Ambulatory Visit: Payer: Medicare Other

## 2014-04-16 ENCOUNTER — Ambulatory Visit: Payer: Medicare Other | Admitting: Internal Medicine

## 2014-04-22 ENCOUNTER — Encounter: Payer: Self-pay | Admitting: Family Medicine

## 2014-04-22 ENCOUNTER — Ambulatory Visit (INDEPENDENT_AMBULATORY_CARE_PROVIDER_SITE_OTHER): Payer: Medicare Other | Admitting: Family Medicine

## 2014-04-22 ENCOUNTER — Other Ambulatory Visit (HOSPITAL_BASED_OUTPATIENT_CLINIC_OR_DEPARTMENT_OTHER): Payer: Medicare Other

## 2014-04-22 VITALS — BP 110/60 | Temp 97.6°F | Ht 71.0 in | Wt 208.0 lb

## 2014-04-22 DIAGNOSIS — C44219 Basal cell carcinoma of skin of left ear and external auricular canal: Secondary | ICD-10-CM

## 2014-04-22 DIAGNOSIS — I10 Essential (primary) hypertension: Secondary | ICD-10-CM

## 2014-04-22 DIAGNOSIS — C3491 Malignant neoplasm of unspecified part of right bronchus or lung: Secondary | ICD-10-CM

## 2014-04-22 DIAGNOSIS — Z23 Encounter for immunization: Secondary | ICD-10-CM

## 2014-04-22 DIAGNOSIS — J4520 Mild intermittent asthma, uncomplicated: Secondary | ICD-10-CM

## 2014-04-22 DIAGNOSIS — H905 Unspecified sensorineural hearing loss: Secondary | ICD-10-CM

## 2014-04-22 DIAGNOSIS — C3411 Malignant neoplasm of upper lobe, right bronchus or lung: Secondary | ICD-10-CM

## 2014-04-22 DIAGNOSIS — I714 Abdominal aortic aneurysm, without rupture, unspecified: Secondary | ICD-10-CM

## 2014-04-22 DIAGNOSIS — J449 Chronic obstructive pulmonary disease, unspecified: Secondary | ICD-10-CM

## 2014-04-22 DIAGNOSIS — J45901 Unspecified asthma with (acute) exacerbation: Secondary | ICD-10-CM

## 2014-04-22 DIAGNOSIS — L570 Actinic keratosis: Secondary | ICD-10-CM

## 2014-04-22 DIAGNOSIS — I4892 Unspecified atrial flutter: Secondary | ICD-10-CM

## 2014-04-22 LAB — COMPREHENSIVE METABOLIC PANEL (CC13)
ALT: 17 U/L (ref 0–55)
ANION GAP: 7 meq/L (ref 3–11)
AST: 16 U/L (ref 5–34)
Albumin: 3.3 g/dL — ABNORMAL LOW (ref 3.5–5.0)
Alkaline Phosphatase: 93 U/L (ref 40–150)
BILIRUBIN TOTAL: 0.9 mg/dL (ref 0.20–1.20)
BUN: 18.6 mg/dL (ref 7.0–26.0)
CALCIUM: 9.6 mg/dL (ref 8.4–10.4)
CHLORIDE: 108 meq/L (ref 98–109)
CO2: 24 meq/L (ref 22–29)
Creatinine: 1 mg/dL (ref 0.7–1.3)
GLUCOSE: 87 mg/dL (ref 70–140)
Potassium: 4.3 mEq/L (ref 3.5–5.1)
Sodium: 139 mEq/L (ref 136–145)
TOTAL PROTEIN: 6.5 g/dL (ref 6.4–8.3)

## 2014-04-22 LAB — CBC WITH DIFFERENTIAL/PLATELET
BASO%: 0.4 % (ref 0.0–2.0)
Basophils Absolute: 0 10*3/uL (ref 0.0–0.1)
EOS ABS: 0.1 10*3/uL (ref 0.0–0.5)
EOS%: 2.1 % (ref 0.0–7.0)
HCT: 23.8 % — ABNORMAL LOW (ref 38.4–49.9)
HGB: 8 g/dL — ABNORMAL LOW (ref 13.0–17.1)
LYMPH#: 1.1 10*3/uL (ref 0.9–3.3)
LYMPH%: 45.9 % (ref 14.0–49.0)
MCH: 30.8 pg (ref 27.2–33.4)
MCHC: 33.6 g/dL (ref 32.0–36.0)
MCV: 91.5 fL (ref 79.3–98.0)
MONO#: 0.4 10*3/uL (ref 0.1–0.9)
MONO%: 18.2 % — ABNORMAL HIGH (ref 0.0–14.0)
NEUT#: 0.8 10*3/uL — ABNORMAL LOW (ref 1.5–6.5)
NEUT%: 33.4 % — ABNORMAL LOW (ref 39.0–75.0)
PLATELETS: 88 10*3/uL — AB (ref 140–400)
RBC: 2.6 10*6/uL — ABNORMAL LOW (ref 4.20–5.82)
RDW: 20.3 % — ABNORMAL HIGH (ref 11.0–14.6)
WBC: 2.4 10*3/uL — ABNORMAL LOW (ref 4.0–10.3)

## 2014-04-22 MED ORDER — LOSARTAN POTASSIUM 50 MG PO TABS: 50.0000 mg | ORAL_TABLET | Freq: Every day | ORAL | Status: DC

## 2014-04-22 MED ORDER — CLOPIDOGREL BISULFATE 75 MG PO TABS: 75.0000 mg | ORAL_TABLET | Freq: Every day | ORAL | Status: DC

## 2014-04-22 MED ORDER — AMLODIPINE BESYLATE 5 MG PO TABS: 5.0000 mg | ORAL_TABLET | Freq: Every day | ORAL | Status: DC

## 2014-04-22 MED ORDER — BUDESONIDE-FORMOTEROL FUMARATE 160-4.5 MCG/ACT IN AERO: INHALATION_SPRAY | RESPIRATORY_TRACT | Status: DC

## 2014-04-22 NOTE — Patient Instructions (Signed)
Stop the Lipitor  Symbicort,,,,,,, one puff twice daily  Norvasc 5 mg,,,,,,, 1 daily  Cozaar 50 mg,,,,,,, 1 daily  Blood pressure check daily in the morning  Follow-up in 4 weeks

## 2014-04-22 NOTE — Progress Notes (Signed)
   Subjective:    Patient ID: Nicholas Schmitt, male    DOB: 1935-10-14, 78 y.o.   MRN: [id number]  HPI Nicholas Schmitt is a 78 year old widowed male ex-smoker who comes in today for evaluation of hypertension hyperlipidemia  He has underlying hypertension on Norvasc 10 mg daily and Cozaar 100 mg daily. BP 110/60 he is lightheaded when he stands up  Weight down to 208. He was here in July with hemoptysis. Abnormal chest x-ray subsequent biopsy set a non-small cell lung cancer stage IV. He's currently undergoing chemotherapy by Dr. Earlie Server recent lab work from oncology shows a hemoglobin of 9.1 white count 2200 normal urine electrolytes normal renal function normal thyroid normal slight elevation of PSA 4.59 that was normal with an LDL cholesterol 50. I think under the circumstances we can stop his Lipitor. He has a history of underlying coronary artery disease and sick sinus syndrome. He has a pacemaker.  He is living alone he has a girlfriend who helps him out 3 children here in [place] and he does have a healthcare power of attorney and living well.  He gets routine eye care, upper and lower dentures, no need for colonoscopies, vaccinations updated by Apolonio Schneiders  Cognitive function normal, he does not walk because of shortness of breath, home health safety reviewed no issues identified, no guns in the house, again he does have a healthcare power of attorney and living well   Review of Systems Review of systems otherwise negative    Objective:   Physical Exam  Well-developed well-nourished male no acute distress vital signs stable he is afebrile BP lower 110/60 and 80s lightheaded when he stands up. Pulse 70 and regular  HEENT were negative except for upper and lower dentures. Neck was supple no adenopathy thyroid normal lungs are clear to auscultation cardiac exam shows a heart rate of 60 no audible murmurs abdominal exam negative no bruits no palpable masses extremities normal skin normal pulses  1+. Genitorectal exam not indicated      Assessment & Plan:  Hypertension........ BP too low.......[name] Decrease Norvasc to 5 mg daily decrease Cozaar to 50 mg daily BP check daily follow-up in one month  Stage IV non-small cell lung cancer....... Eliminate all but essential medications...... He can stop the Lipitor......[name] Recommend the Symbicort 1 puff daily which might help the shortness of breath however I think the shortness of breath is mainly related to his anemia from the chemotherapy.

## 2014-04-22 NOTE — Progress Notes (Signed)
Pre visit review using our clinic review tool, if applicable. No additional management support is needed unless otherwise documented below in the visit note. 

## 2014-04-29 ENCOUNTER — Ambulatory Visit (HOSPITAL_COMMUNITY)
Admission: RE | Admit: 2014-04-29 | Discharge: 2014-04-29 | Disposition: A | Discharge status: Home / Self Care | Payer: Medicare Other | Source: Ambulatory Visit | Attending: Internal Medicine | Admitting: Internal Medicine

## 2014-04-29 ENCOUNTER — Other Ambulatory Visit: Payer: Medicare Other

## 2014-04-29 ENCOUNTER — Telehealth: Payer: Self-pay | Admitting: Adult Health

## 2014-04-29 ENCOUNTER — Ambulatory Visit (HOSPITAL_BASED_OUTPATIENT_CLINIC_OR_DEPARTMENT_OTHER): Payer: Medicare Other | Admitting: Adult Health

## 2014-04-29 ENCOUNTER — Other Ambulatory Visit (HOSPITAL_BASED_OUTPATIENT_CLINIC_OR_DEPARTMENT_OTHER): Payer: Medicare Other

## 2014-04-29 ENCOUNTER — Ambulatory Visit (HOSPITAL_BASED_OUTPATIENT_CLINIC_OR_DEPARTMENT_OTHER): Payer: Medicare Other

## 2014-04-29 ENCOUNTER — Encounter: Payer: Self-pay | Admitting: Adult Health

## 2014-04-29 ENCOUNTER — Other Ambulatory Visit: Payer: Self-pay | Admitting: *Deleted

## 2014-04-29 VITALS — BP 124/68 | HR 81 | Temp 97.5°F | Resp 18 | Ht 71.0 in | Wt 211.2 lb

## 2014-04-29 DIAGNOSIS — C3491 Malignant neoplasm of unspecified part of right bronchus or lung: Secondary | ICD-10-CM

## 2014-04-29 DIAGNOSIS — D6481 Anemia due to antineoplastic chemotherapy: Secondary | ICD-10-CM

## 2014-04-29 DIAGNOSIS — C3411 Malignant neoplasm of upper lobe, right bronchus or lung: Secondary | ICD-10-CM

## 2014-04-29 DIAGNOSIS — D649 Anemia, unspecified: Secondary | ICD-10-CM | POA: Diagnosis not present

## 2014-04-29 DIAGNOSIS — G893 Neoplasm related pain (acute) (chronic): Secondary | ICD-10-CM

## 2014-04-29 DIAGNOSIS — J449 Chronic obstructive pulmonary disease, unspecified: Secondary | ICD-10-CM

## 2014-04-29 DIAGNOSIS — Z5111 Encounter for antineoplastic chemotherapy: Secondary | ICD-10-CM

## 2014-04-29 LAB — ABO/RH: ABO/RH(D): O POS

## 2014-04-29 LAB — CBC WITH DIFFERENTIAL/PLATELET
BASO%: 0 % (ref 0.0–2.0)
Basophils Absolute: 0 10e3/uL (ref 0.0–0.1)
EOS%: 0 % (ref 0.0–7.0)
Eosinophils Absolute: 0 10e3/uL (ref 0.0–0.5)
HCT: 24.6 % — ABNORMAL LOW (ref 38.4–49.9)
HGB: 7.9 g/dL — ABNORMAL LOW (ref 13.0–17.1)
LYMPH%: 11.3 % — ABNORMAL LOW (ref 14.0–49.0)
MCH: 30.4 pg (ref 27.2–33.4)
MCHC: 32.1 g/dL (ref 32.0–36.0)
MCV: 94.6 fL (ref 79.3–98.0)
MONO#: 0.3 10e3/uL (ref 0.1–0.9)
MONO%: 5.9 % (ref 0.0–14.0)
NEUT#: 4.2 10e3/uL (ref 1.5–6.5)
NEUT%: 82.8 % — ABNORMAL HIGH (ref 39.0–75.0)
Platelets: 296 10e3/uL (ref 140–400)
RBC: 2.6 10e6/uL — ABNORMAL LOW (ref 4.20–5.82)
RDW: 21.8 % — ABNORMAL HIGH (ref 11.0–14.6)
WBC: 5.1 10e3/uL (ref 4.0–10.3)
lymph#: 0.6 10e3/uL — ABNORMAL LOW (ref 0.9–3.3)

## 2014-04-29 LAB — COMPREHENSIVE METABOLIC PANEL (CC13)
ALT: 16 U/L (ref 0–55)
AST: 15 U/L (ref 5–34)
Albumin: 3.3 g/dL — ABNORMAL LOW (ref 3.5–5.0)
Alkaline Phosphatase: 86 U/L (ref 40–150)
Anion Gap: 9 mEq/L (ref 3–11)
BUN: 20.1 mg/dL (ref 7.0–26.0)
CHLORIDE: 111 meq/L — AB (ref 98–109)
CO2: 20 mEq/L — ABNORMAL LOW (ref 22–29)
CREATININE: 0.9 mg/dL (ref 0.7–1.3)
Calcium: 9.6 mg/dL (ref 8.4–10.4)
Glucose: 130 mg/dl (ref 70–140)
Potassium: 4.5 mEq/L (ref 3.5–5.1)
SODIUM: 139 meq/L (ref 136–145)
Total Bilirubin: 0.67 mg/dL (ref 0.20–1.20)
Total Protein: 6.5 g/dL (ref 6.4–8.3)

## 2014-04-29 MED ORDER — ONDANSETRON 16 MG/50ML IVPB (CHCC)
INTRAVENOUS | Status: AC
  Filled 2014-04-29: qty 16

## 2014-04-29 MED ORDER — AMLODIPINE BESYLATE 5 MG PO TABS: 5.0000 mg | ORAL_TABLET | Freq: Every day | ORAL | Status: DC

## 2014-04-29 MED ORDER — SODIUM CHLORIDE 0.9 % IV SOLN
437.2000 mg | Freq: Once | INTRAVENOUS | Status: AC
  Administered 2014-04-29: 440 mg via INTRAVENOUS
  Filled 2014-04-29: qty 44

## 2014-04-29 MED ORDER — CLOPIDOGREL BISULFATE 75 MG PO TABS: 75.0000 mg | ORAL_TABLET | Freq: Every day | ORAL | Status: DC

## 2014-04-29 MED ORDER — SODIUM CHLORIDE 0.9 % IV SOLN
Freq: Once | INTRAVENOUS | Status: AC
  Administered 2014-04-29: 11:00:00 via INTRAVENOUS

## 2014-04-29 MED ORDER — ONDANSETRON 16 MG/50ML IVPB (CHCC)
16.0000 mg | Freq: Once | INTRAVENOUS | Status: AC
  Administered 2014-04-29: 16 mg via INTRAVENOUS

## 2014-04-29 MED ORDER — DEXAMETHASONE SODIUM PHOSPHATE 20 MG/5ML IJ SOLN
INTRAMUSCULAR | Status: AC
  Filled 2014-04-29: qty 5

## 2014-04-29 MED ORDER — DEXAMETHASONE SODIUM PHOSPHATE 20 MG/5ML IJ SOLN
20.0000 mg | Freq: Once | INTRAMUSCULAR | Status: AC
  Administered 2014-04-29: 20 mg via INTRAVENOUS

## 2014-04-29 MED ORDER — BUDESONIDE-FORMOTEROL FUMARATE 160-4.5 MCG/ACT IN AERO: INHALATION_SPRAY | RESPIRATORY_TRACT | Status: DC

## 2014-04-29 MED ORDER — LOSARTAN POTASSIUM 50 MG PO TABS: 50.0000 mg | ORAL_TABLET | Freq: Every day | ORAL | Status: DC

## 2014-04-29 MED ORDER — SODIUM CHLORIDE 0.9 % IV SOLN
410.0000 mg/m2 | Freq: Once | INTRAVENOUS | Status: AC
  Administered 2014-04-29: 900 mg via INTRAVENOUS
  Filled 2014-04-29: qty 36

## 2014-04-29 NOTE — Progress Notes (Signed)
[facility] [phone]   [phone]  OFFICE PROGRESS NOTE  [name],[name] [name], MD [place] [address]  DIAGNOSIS: Non-small cell carcinoma of lung, stage 4  Primary site: Lung (Right)  Staging method: AJCC 7th Edition  Clinical: Stage IV (T1a, N3, M1b) signed by Curt Bears, MD on 02/01/2014 1:42 PM  Summary: Stage IV (T1a, N3, M1b) Prostate cancer  Primary site: Prostate  Clinical: Stage I (T1c, NX, M0) signed by Wyatt Portela, MD on 08/06/2013 1:59 PM  Summary: Stage I (T1c, NX, M0)  PRIOR THERAPY: None  CURRENT THERAPY: Systemic chemotherapy with carboplatin for an AUC of 5 and Alimta 500 mg per meter squared given every 3 weeks. Cycle 5 day 1  INTERVAL HISTORY: KONGMENG SANTORO 78 y.o. male returns to the clinic today for Follow-up visit accompanied by his wife.  He is here for evaluation prior to receiving his fifth cycle of chemotherapy with Carboplatin and Alimta.  He is slightly anemic today with a hemoglobin on 7.9.  He was dizzy, however he recently saw his cardiologist and his anti-hypertensives were decreased, his pacemaker was checked, and his lipitor was stopped.  He is feeling less fatigued now that he is not on the Lipitor.  He is not as dizzy since the change in his BP medication.  He denies headaches, increased dyspnea, increased DOE, palpitations, further dizziness.  He did have some soreness in his legs last night that resolved with Aleve.  He does continue to have intermittent back pain.  This is controlled with Oxycodone.  He took Oxycodone last 3-4 days ago.  He denies fevers, chills, nausea, vomiting, constipation, diarrhea, numbness/tingling, or any further concerns.       MEDICAL HISTORY: Past Medical History  Diagnosis Date  . Hypertension   . CAD (coronary artery disease)     positive stress test in 2006 led to left heart cath (8/06) showing 95% prox RCA, 90% CFX, and 90% mLAD.   patient had a cypher DES to all 3 lesions  . Hyperlipidemia   . Hearing loss   . AAA (abdominal aortic aneurysm) 11/09    3.2 cm   . Chronic renal insufficiency   . History of radiation therapy 03/08/04- 04/08/04    prostate 4600 cGy in 3 fractions, radioactive seed implant 05/04/04  . Myocardial infarction 1995  . Pacemaker 2014  . Dysrhythmia   . Shortness of breath     exertion  . Asthma     bronchial  . COPD (chronic obstructive pulmonary disease)   . Cancer 2015    prostate   . Prostate cancer 2005    gleason 7  . Skin cancer     ear    ALLERGIES:  has No Known Allergies.  MEDICATIONS:  Current Outpatient Prescriptions  Medication Sig Dispense Refill  . amLODipine (NORVASC) 5 MG tablet Take 1 tablet (5 mg total) by mouth daily. 90 tablet 3  . budesonide-formoterol (SYMBICORT) 160-4.5 MCG/ACT inhaler 1 puff twice daily 4 Inhaler 3  . clopidogrel (PLAVIX) 75 MG tablet Take 1 tablet (75 mg total) by mouth daily. 100 tablet 3  . dexamethasone (DECADRON) 4 MG tablet 4 mg by mouth twice a day the day before, day of and day after the chemotherapy every 3 weeks. 40 tablet 1  . folic acid (FOLVITE) 1 MG tablet Take 1 tablet (1 mg total) by mouth daily. 30 tablet 3  . losartan (COZAAR) 50 MG  tablet Take 1 tablet (50 mg total) by mouth daily. 90 tablet 3  . Multiple Minerals (CALCIUM/MAGNESIUM/ZINC PO) Take 1 capsule by mouth every evening.    . Multiple Vitamin (MULTIVITAMIN) tablet Take 1 tablet by mouth daily.     . naproxen sodium (ANAPROX) 220 MG tablet Take 220 mg by mouth as needed (pain).     . prochlorperazine (COMPAZINE) 10 MG tablet Take 1 tablet (10 mg total) by mouth every 6 (six) hours as needed for nausea or vomiting. 30 tablet 0  . albuterol (PROVENTIL,VENTOLIN) 90 MCG/ACT inhaler Inhale 2 puffs into the lungs every 6 (six) hours as needed for wheezing or shortness of breath.    . nitroGLYCERIN (NITROSTAT) 0.4 MG SL tablet Place 0.4 mg under the tongue every 5 (five)  minutes as needed for chest pain (MAX 3 TABLETS).    [name] oxyCODONE (OXY IR/ROXICODONE) 5 MG immediate release tablet Take 1-2 tablets (5-10 mg total) by mouth every 4 (four) hours as needed for moderate pain. 30 tablet 0   No current facility-administered medications for this visit.    SURGICAL HISTORY:  Past Surgical History  Procedure Laterality Date  . Ptca  2006  . Coronary stent placement    . Permanent pacemaker insertion Bilateral 02/26/13    MDT Adapta L implanted by Dr Rayann Heman for sick sinus syndrome  . Radioactive seed implant  05/04/2004    8000 cGy, Dr Danny Lawless Dr Reece Agar  . Elbow surgery Bilateral 1973  . Insert / replace / remove pacemaker    . Coronary angioplasty    . Mediastinoscopy N/A 01/28/2014    Procedure: MEDIASTINOSCOPY;  Surgeon: Melrose Nakayama, MD;  Location: [facility];  Service: Thoracic;  Laterality: N/A;    REVIEW OF SYSTEMS:  A 10 point review of systems was conducted and is otherwise negative except for what is noted above.      PHYSICAL EXAMINATION: BP 124/68 mmHg  Pulse 81  Temp(Src) 97.5 F (36.4 C) (Oral)  Resp 18  Ht 5\' 11"  (1.803 m)  Wt 211 lb 3.2 oz (95.8 kg)  BMI 29.47 kg/m2 GENERAL: Patient is a well appearing older male in no acute distress HEENT:  Sclerae anicteric.  Oropharynx clear and moist. No ulcerations or evidence of oropharyngeal candidiasis. Neck is supple.  NODES:  No cervical, supraclavicular, or axillary lymphadenopathy palpated.  LUNGS:  Clear to auscultation bilaterally.  No wheezes or rhonchi. HEART:  Regular rate and rhythm. No murmur appreciated. ABDOMEN:  Soft, nontender.  Positive, normoactive bowel sounds. No organomegaly palpated. MSK:  No focal spinal tenderness to palpation. Full range of motion bilaterally in the upper extremities. EXTREMITIES:  No peripheral edema.   SKIN:  Clear with no obvious rashes or skin changes. No nail dyscrasia. NEURO:  Nonfocal. Well oriented.  Appropriate affect. ECOG PERFORMANCE  STATUS: 1 - Symptomatic but completely ambulatory    LABORATORY DATA: Lab Results  Component Value Date   WBC 5.1 04/29/2014   HGB 7.9* 04/29/2014   HCT 24.6* 04/29/2014   MCV 94.6 04/29/2014   PLT 296 04/29/2014      Chemistry      Component Value Date/Time   NA 139 04/29/2014 0907   NA 141 04/15/2014 0755   K 4.5 04/29/2014 0907   K 5.0 04/15/2014 0755   CL 109 04/15/2014 0755   CO2 20* 04/29/2014 0907   CO2 26 04/15/2014 0755   BUN 20.1 04/29/2014 0907   BUN 19 04/15/2014 0755   CREATININE 0.9 04/29/2014 6962  CREATININE 0.8 04/15/2014 0755      Component Value Date/Time   CALCIUM 9.6 04/29/2014 0907   CALCIUM 9.2 04/15/2014 0755   ALKPHOS 86 04/29/2014 0907   ALKPHOS 87 04/15/2014 0755   AST 15 04/29/2014 0907   AST 24 04/15/2014 0755   ALT 16 04/29/2014 0907   ALT 24 04/15/2014 0755   BILITOT 0.67 04/29/2014 0907   BILITOT 1.3* 04/15/2014 0755       RADIOGRAPHIC STUDIES:  CLINICAL DATA: Non-small cell carcinoma of lung, stage 4, unspecified laterality C34.90 (ICD-10-CM), metastatic lung cancer, evaluate response to treatment.  EXAM: CT CHEST, ABDOMEN, AND PELVIS WITH CONTRAST  TECHNIQUE: Multidetector CT imaging of the chest, abdomen and pelvis was performed following the standard protocol during bolus administration of intravenous contrast.  CONTRAST: 127mL OMNIPAQUE IOHEXOL 300 MG/ML SOLN  COMPARISON: PET 01/17/2014 and CT chest 01/09/2014. CT abdomen pelvis 07/02/2013.  FINDINGS: CT CHEST FINDINGS  Mediastinal lymph nodes measure up to 2.0 cm in the prevascular space (previously 2.5 cm) and 2.1 cm in the lower right paratracheal station (previously 2.5 cm). Right hilar adenopathy measures up to 11 mm (previously 2.3 cm). No left hilar or axillary adenopathy. No subcarinal adenopathy. Extensive 3 vessel coronary artery calcification. Pacemaker lead tips terminate in the right atrium and right ventricle. Heart size normal. No  pericardial effusion. Tiny hiatal hernia.  Apical segment right upper lobe nodules measure up to 1.0 x 1.5 cm (previously 1.2 x 1.7 cm). Note is made that image quality is somewhat degraded by respiratory motion. Bronchiectasis and scarring in the right middle lobe, medially. Mild subpleural scarring in the right lower lobe. 4 mm left lower lobe nodule (series 5, image 30), unchanged. No pleural fluid. Airway is otherwise unremarkable.  CT ABDOMEN AND PELVIS FINDINGS  Hepatobiliary: Low-attenuation lesions in the liver measure up to 2.0 cm in the dome, as before. Gallbladder is unremarkable. No biliary ductal dilatation.  Pancreas: Negative.  Spleen: Negative.  Adrenals/Urinary Tract: Right adrenal gland thickening or nodule measures 1.1 x 2.1 cm, stable when remeasured on the prior exam. Low-attenuation lesions in the kidneys measure up to 3.7 cm on the left, as before, indicative of cysts. Ureters are decompressed. Bladder is unremarkable.  Stomach/Bowel: Stomach, small bowel and appendix are otherwise unremarkable. A fair amount of stool is seen in the colon, suggesting constipation. Colon is otherwise unremarkable.  Vascular/Lymphatic: Atherosclerotic calcification of the arterial vasculature. Infrarenal aorta measures up to 3.3 x 3.4 cm above the bifurcation. No pathologically enlarged lymph nodes.  Reproductive: Brachytherapy seeds are seen in the prostate.  Other: Bilateral inguinal hernia repairs with small fat containing bilateral inguinal hernias. No free fluid. Mesenteries and peritoneum are unremarkable. Fat containing left Bochdalek hernia incidentally noted.  Musculoskeletal: A lucent lesion in the right para midline sacrum measures 1.7 x 2.2 cm (series 2, image 97), increased from 7 x 9 mm on 01/17/2014. A lytic lesion in the right L5 vertebral body measures 1.6 cm (series 2, image 90) is stable. Old anterior left rib  fracture.  IMPRESSION: 1. Interval response to therapy as evidenced by improvement in mediastinal and right hilar adenopathy as well as slight decrease in size of a right upper lobe nodule. 2. Sacral metastasis appears larger. Right L5 metastasis is unchanged. 3. Right adrenal thickening or nodule, stable. 4. Extensive 3 vessel coronary artery calcification. 5. Infrarenal aortic aneurysm. 6. Bilateral inguinal hernia repairs with small fat containing bilateral inguinal hernias.   Electronically Signed  By: Lorin Picket M.D.  On: 03/24/2014 10:02      ASSESSMENT AND PLAN: this is a very pleasant 78 years old white male with stage IV non-small cell lung cancer currently undergoing systemic chemotherapy with carboplatin and Alimta.  He is due for cycle 5 day 1.   Kalob has chemotherapy induced anemia with a hemoglobin of 7.9.  He is asymptomatic.  I reviewed his labs with Dr. Julien Nordmann.  He will proceed with treatment today, and return tomorrow for a blood transfusion.  I contacted Colletta Maryland, RN in the treatment room and notified her of the change, and requested a type and screen be drawn when he is back receiving his treatment.  I reviewed with Nathanel the risks/benefits of blood transfusion and gave him detailed information in his AVS for him to review.    Wanya does have cancer related pain that is intermittent and controlled with Oxycodone.  He will continue this.    I again reviewed the above scans with Jori Moll at his request.  He will return tomorrow for PRBCs, weekly for labs, and on 05/20/14 for labs, an office visit, and his sixth cycle of Carboplatin/Alimta.    All questions were answered. The patient knows to call the clinic with any problems, questions or concerns. We can certainly see the patient much sooner if necessary.  I spent 25 minutes counseling the patient face to face. The total time spent in the appointment was 30 minutes.  Minette Headland, [place] [phone]

## 2014-04-29 NOTE — Patient Instructions (Signed)
[facility] Discharge Instructions for Patients Receiving Chemotherapy  Today you received the following chemotherapy agents Alimta, Carboplatin.  To help prevent nausea and vomiting after your treatment, we encourage you to take your nausea medication Compazine 10 mg.   If you develop nausea and vomiting that is not controlled by your nausea medication, call the clinic.   BELOW ARE SYMPTOMS THAT SHOULD BE REPORTED IMMEDIATELY:  *FEVER GREATER THAN 100.5 F  *CHILLS WITH OR WITHOUT FEVER  NAUSEA AND VOMITING THAT IS NOT CONTROLLED WITH YOUR NAUSEA MEDICATION  *UNUSUAL SHORTNESS OF BREATH  *UNUSUAL BRUISING OR BLEEDING  TENDERNESS IN MOUTH AND THROAT WITH OR WITHOUT PRESENCE OF ULCERS  *URINARY PROBLEMS  *BOWEL PROBLEMS  UNUSUAL RASH Items with * indicate a potential emergency and should be followed up as soon as possible.  Feel free to call the clinic you have any questions or concerns. The clinic phone number is [phone].

## 2014-04-29 NOTE — Progress Notes (Signed)
Discharged at 1323 in no distress and ambulatory.

## 2014-04-29 NOTE — Telephone Encounter (Signed)
rx sent

## 2014-04-29 NOTE — Progress Notes (Signed)
Lab drawn for Type and crossnatch with IV start.  For transfusion tomorrow.  HGB = 7.9, admits to s.o.b.  Denies s.o.b. At this time.

## 2014-04-29 NOTE — Telephone Encounter (Signed)
per pof to sch pt appt-gave pt copy of sch °

## 2014-04-29 NOTE — Patient Instructions (Signed)

## 2014-04-30 ENCOUNTER — Ambulatory Visit (HOSPITAL_BASED_OUTPATIENT_CLINIC_OR_DEPARTMENT_OTHER): Payer: Medicare Other

## 2014-04-30 VITALS — BP 141/64 | HR 65 | Temp 97.0°F | Resp 18

## 2014-04-30 DIAGNOSIS — C3411 Malignant neoplasm of upper lobe, right bronchus or lung: Secondary | ICD-10-CM

## 2014-04-30 DIAGNOSIS — C3491 Malignant neoplasm of unspecified part of right bronchus or lung: Secondary | ICD-10-CM

## 2014-04-30 LAB — PREPARE RBC (CROSSMATCH)

## 2014-04-30 MED ORDER — FUROSEMIDE 10 MG/ML IJ SOLN
20.0000 mg | Freq: Once | INTRAMUSCULAR | Status: AC
  Administered 2014-04-30: 20 mg via INTRAVENOUS

## 2014-04-30 MED ORDER — DIPHENHYDRAMINE HCL 25 MG PO CAPS
25.0000 mg | ORAL_CAPSULE | Freq: Once | ORAL | Status: AC
  Administered 2014-04-30: 25 mg via ORAL

## 2014-04-30 MED ORDER — DIPHENHYDRAMINE HCL 25 MG PO CAPS
ORAL_CAPSULE | ORAL | Status: AC
  Filled 2014-04-30: qty 1

## 2014-04-30 MED ORDER — SODIUM CHLORIDE 0.9 % IV SOLN
250.0000 mL | Freq: Once | INTRAVENOUS | Status: AC
  Administered 2014-04-30: 250 mL via INTRAVENOUS

## 2014-04-30 MED ORDER — ACETAMINOPHEN 325 MG PO TABS
650.0000 mg | ORAL_TABLET | Freq: Once | ORAL | Status: AC
  Administered 2014-04-30: 650 mg via ORAL

## 2014-04-30 MED ORDER — ACETAMINOPHEN 325 MG PO TABS
ORAL_TABLET | ORAL | Status: AC
  Filled 2014-04-30: qty 2

## 2014-04-30 NOTE — Patient Instructions (Signed)

## 2014-05-01 LAB — TYPE AND SCREEN
ABO/RH(D): O POS
Antibody Screen: NEGATIVE
Unit division: 0
Unit division: 0

## 2014-05-06 ENCOUNTER — Other Ambulatory Visit: Payer: Medicare Other

## 2014-05-06 ENCOUNTER — Other Ambulatory Visit (HOSPITAL_BASED_OUTPATIENT_CLINIC_OR_DEPARTMENT_OTHER): Payer: Medicare Other

## 2014-05-06 ENCOUNTER — Telehealth: Payer: Self-pay | Admitting: *Deleted

## 2014-05-06 DIAGNOSIS — C3491 Malignant neoplasm of unspecified part of right bronchus or lung: Secondary | ICD-10-CM

## 2014-05-06 DIAGNOSIS — C3411 Malignant neoplasm of upper lobe, right bronchus or lung: Secondary | ICD-10-CM

## 2014-05-06 LAB — CBC WITH DIFFERENTIAL/PLATELET
BASO%: 0.7 % (ref 0.0–2.0)
BASOS ABS: 0 10*3/uL (ref 0.0–0.1)
EOS ABS: 0 10*3/uL (ref 0.0–0.5)
EOS%: 0.7 % (ref 0.0–7.0)
HEMATOCRIT: 30.6 % — AB (ref 38.4–49.9)
HEMOGLOBIN: 10.1 g/dL — AB (ref 13.0–17.1)
LYMPH%: 59 % — AB (ref 14.0–49.0)
MCH: 30.2 pg (ref 27.2–33.4)
MCHC: 33 g/dL (ref 32.0–36.0)
MCV: 91.6 fL (ref 79.3–98.0)
MONO#: 0.2 10*3/uL (ref 0.1–0.9)
MONO%: 16 % — AB (ref 0.0–14.0)
NEUT%: 23.6 % — AB (ref 39.0–75.0)
NEUTROS ABS: 0.3 10*3/uL — AB (ref 1.5–6.5)
PLATELETS: 164 10*3/uL (ref 140–400)
RBC: 3.34 10*6/uL — ABNORMAL LOW (ref 4.20–5.82)
RDW: 18 % — AB (ref 11.0–14.6)
WBC: 1.4 10*3/uL — ABNORMAL LOW (ref 4.0–10.3)
lymph#: 0.9 10*3/uL (ref 0.9–3.3)
nRBC: 0 % (ref 0–0)

## 2014-05-06 LAB — COMPREHENSIVE METABOLIC PANEL (CC13)
ALT: 20 U/L (ref 0–55)
ANION GAP: 10 meq/L (ref 3–11)
AST: 19 U/L (ref 5–34)
Albumin: 3.5 g/dL (ref 3.5–5.0)
Alkaline Phosphatase: 106 U/L (ref 40–150)
BILIRUBIN TOTAL: 1.58 mg/dL — AB (ref 0.20–1.20)
BUN: 16.8 mg/dL (ref 7.0–26.0)
CO2: 23 meq/L (ref 22–29)
Calcium: 9.6 mg/dL (ref 8.4–10.4)
Chloride: 104 mEq/L (ref 98–109)
Creatinine: 0.8 mg/dL (ref 0.7–1.3)
GLUCOSE: 100 mg/dL (ref 70–140)
Potassium: 4.2 mEq/L (ref 3.5–5.1)
SODIUM: 137 meq/L (ref 136–145)
TOTAL PROTEIN: 6.9 g/dL (ref 6.4–8.3)

## 2014-05-06 NOTE — Telephone Encounter (Addendum)
Spoke with pt regarding neutropenic pxns, he verbalized understanding

## 2014-05-06 NOTE — Telephone Encounter (Signed)
Left message for patient to call back regarding lab results.

## 2014-05-13 ENCOUNTER — Other Ambulatory Visit (HOSPITAL_BASED_OUTPATIENT_CLINIC_OR_DEPARTMENT_OTHER): Payer: Medicare Other

## 2014-05-13 ENCOUNTER — Telehealth: Payer: Self-pay | Admitting: *Deleted

## 2014-05-13 DIAGNOSIS — C3491 Malignant neoplasm of unspecified part of right bronchus or lung: Secondary | ICD-10-CM

## 2014-05-13 DIAGNOSIS — C3411 Malignant neoplasm of upper lobe, right bronchus or lung: Secondary | ICD-10-CM

## 2014-05-13 LAB — COMPREHENSIVE METABOLIC PANEL (CC13)
ALT: 17 U/L (ref 0–55)
ANION GAP: 10 meq/L (ref 3–11)
AST: 16 U/L (ref 5–34)
Albumin: 3.3 g/dL — ABNORMAL LOW (ref 3.5–5.0)
Alkaline Phosphatase: 103 U/L (ref 40–150)
BUN: 15.9 mg/dL (ref 7.0–26.0)
CHLORIDE: 108 meq/L (ref 98–109)
CO2: 23 mEq/L (ref 22–29)
Calcium: 9.7 mg/dL (ref 8.4–10.4)
Creatinine: 1 mg/dL (ref 0.7–1.3)
Glucose: 124 mg/dl (ref 70–140)
Potassium: 4.4 mEq/L (ref 3.5–5.1)
Sodium: 141 mEq/L (ref 136–145)
TOTAL PROTEIN: 6.5 g/dL (ref 6.4–8.3)
Total Bilirubin: 0.91 mg/dL (ref 0.20–1.20)

## 2014-05-13 LAB — CBC WITH DIFFERENTIAL/PLATELET
BASO%: 0 % (ref 0.0–2.0)
Basophils Absolute: 0 10*3/uL (ref 0.0–0.1)
EOS%: 1.1 % (ref 0.0–7.0)
Eosinophils Absolute: 0 10*3/uL (ref 0.0–0.5)
HEMATOCRIT: 27.7 % — AB (ref 38.4–49.9)
HGB: 9 g/dL — ABNORMAL LOW (ref 13.0–17.1)
LYMPH#: 1 10*3/uL (ref 0.9–3.3)
LYMPH%: 35.5 % (ref 14.0–49.0)
MCH: 30.5 pg (ref 27.2–33.4)
MCHC: 32.5 g/dL (ref 32.0–36.0)
MCV: 93.9 fL (ref 79.3–98.0)
MONO#: 0.6 10*3/uL (ref 0.1–0.9)
MONO%: 22 % — ABNORMAL HIGH (ref 0.0–14.0)
NEUT%: 41.4 % (ref 39.0–75.0)
NEUTROS ABS: 1.2 10*3/uL — AB (ref 1.5–6.5)
Platelets: 94 10*3/uL — ABNORMAL LOW (ref 140–400)
RBC: 2.95 10*6/uL — ABNORMAL LOW (ref 4.20–5.82)
RDW: 18.3 % — ABNORMAL HIGH (ref 11.0–14.6)
WBC: 2.8 10*3/uL — AB (ref 4.0–10.3)

## 2014-05-13 LAB — HOLD TUBE, BLOOD BANK

## 2014-05-13 NOTE — Telephone Encounter (Signed)
Per Dr Vista Mink, neutropenic pxns, info packet given to patient regarding neutropenia.  Pt verbalized understanding.  Pt states he has been c/o headaches, some dizziness when he first stands up.  Informed Dr Vista Mink, advised tylenol and will assess at next f/u visit.  Asked if pt could call back if tylenol does not help resolve his h/a.

## 2014-05-20 ENCOUNTER — Encounter: Payer: Self-pay | Admitting: Physician Assistant

## 2014-05-20 ENCOUNTER — Other Ambulatory Visit: Payer: Medicare Other

## 2014-05-20 ENCOUNTER — Ambulatory Visit (HOSPITAL_BASED_OUTPATIENT_CLINIC_OR_DEPARTMENT_OTHER): Payer: Medicare Other

## 2014-05-20 ENCOUNTER — Ambulatory Visit (HOSPITAL_BASED_OUTPATIENT_CLINIC_OR_DEPARTMENT_OTHER): Payer: Medicare Other | Admitting: Physician Assistant

## 2014-05-20 ENCOUNTER — Other Ambulatory Visit (HOSPITAL_BASED_OUTPATIENT_CLINIC_OR_DEPARTMENT_OTHER): Payer: Medicare Other

## 2014-05-20 ENCOUNTER — Telehealth: Payer: Self-pay | Admitting: Internal Medicine

## 2014-05-20 VITALS — BP 143/68 | HR 77 | Temp 98.0°F | Resp 21 | Ht 71.0 in | Wt 208.2 lb

## 2014-05-20 DIAGNOSIS — C3491 Malignant neoplasm of unspecified part of right bronchus or lung: Secondary | ICD-10-CM

## 2014-05-20 DIAGNOSIS — C3411 Malignant neoplasm of upper lobe, right bronchus or lung: Secondary | ICD-10-CM

## 2014-05-20 DIAGNOSIS — Z5111 Encounter for antineoplastic chemotherapy: Secondary | ICD-10-CM

## 2014-05-20 LAB — CBC WITH DIFFERENTIAL/PLATELET
BASO%: 0.2 % (ref 0.0–2.0)
Basophils Absolute: 0 10*3/uL (ref 0.0–0.1)
EOS%: 0.2 % (ref 0.0–7.0)
Eosinophils Absolute: 0 10*3/uL (ref 0.0–0.5)
HCT: 28.7 % — ABNORMAL LOW (ref 38.4–49.9)
HGB: 9.1 g/dL — ABNORMAL LOW (ref 13.0–17.1)
LYMPH%: 11.5 % — ABNORMAL LOW (ref 14.0–49.0)
MCH: 30.4 pg (ref 27.2–33.4)
MCHC: 31.7 g/dL — ABNORMAL LOW (ref 32.0–36.0)
MCV: 96 fL (ref 79.3–98.0)
MONO#: 0.2 10*3/uL (ref 0.1–0.9)
MONO%: 4.5 % (ref 0.0–14.0)
NEUT#: 3.7 10*3/uL (ref 1.5–6.5)
NEUT%: 83.6 % — ABNORMAL HIGH (ref 39.0–75.0)
Platelets: 295 10*3/uL (ref 140–400)
RBC: 2.99 10*6/uL — ABNORMAL LOW (ref 4.20–5.82)
RDW: 19.2 % — AB (ref 11.0–14.6)
WBC: 4.4 10*3/uL (ref 4.0–10.3)
lymph#: 0.5 10*3/uL — ABNORMAL LOW (ref 0.9–3.3)

## 2014-05-20 LAB — COMPREHENSIVE METABOLIC PANEL (CC13)
ALK PHOS: 103 U/L (ref 40–150)
ALT: 16 U/L (ref 0–55)
AST: 16 U/L (ref 5–34)
Albumin: 3.5 g/dL (ref 3.5–5.0)
Anion Gap: 12 mEq/L — ABNORMAL HIGH (ref 3–11)
BUN: 14.7 mg/dL (ref 7.0–26.0)
CO2: 22 mEq/L (ref 22–29)
Calcium: 10.1 mg/dL (ref 8.4–10.4)
Chloride: 106 mEq/L (ref 98–109)
Creatinine: 0.9 mg/dL (ref 0.7–1.3)
EGFR: 82 mL/min/{1.73_m2} — ABNORMAL LOW (ref >90)
Glucose: 129 mg/dl (ref 70–140)
Potassium: 4.3 mEq/L (ref 3.5–5.1)
SODIUM: 140 meq/L (ref 136–145)
Total Bilirubin: 0.69 mg/dL (ref 0.20–1.20)
Total Protein: 7.2 g/dL (ref 6.4–8.3)

## 2014-05-20 MED ORDER — CYANOCOBALAMIN 1000 MCG/ML IJ SOLN
1000.0000 ug | Freq: Once | INTRAMUSCULAR | Status: AC
  Administered 2014-05-20: 1000 ug via INTRAMUSCULAR

## 2014-05-20 MED ORDER — ONDANSETRON 16 MG/50ML IVPB (CHCC)
INTRAVENOUS | Status: AC
  Filled 2014-05-20: qty 16

## 2014-05-20 MED ORDER — SODIUM CHLORIDE 0.9 % IV SOLN
437.2000 mg | Freq: Once | INTRAVENOUS | Status: AC
  Administered 2014-05-20: 440 mg via INTRAVENOUS
  Filled 2014-05-20: qty 44

## 2014-05-20 MED ORDER — DEXAMETHASONE SODIUM PHOSPHATE 20 MG/5ML IJ SOLN
INTRAMUSCULAR | Status: AC
  Filled 2014-05-20: qty 5

## 2014-05-20 MED ORDER — DEXAMETHASONE SODIUM PHOSPHATE 20 MG/5ML IJ SOLN
20.0000 mg | Freq: Once | INTRAMUSCULAR | Status: AC
  Administered 2014-05-20: 20 mg via INTRAVENOUS

## 2014-05-20 MED ORDER — CYANOCOBALAMIN 1000 MCG/ML IJ SOLN
INTRAMUSCULAR | Status: AC
  Filled 2014-05-20: qty 1

## 2014-05-20 MED ORDER — SODIUM CHLORIDE 0.9 % IV SOLN
Freq: Once | INTRAVENOUS | Status: AC
  Administered 2014-05-20: 10:00:00 via INTRAVENOUS

## 2014-05-20 MED ORDER — ONDANSETRON 16 MG/50ML IVPB (CHCC)
16.0000 mg | Freq: Once | INTRAVENOUS | Status: AC
  Administered 2014-05-20: 16 mg via INTRAVENOUS

## 2014-05-20 MED ORDER — PEMETREXED DISODIUM CHEMO INJECTION 500 MG
410.0000 mg/m2 | Freq: Once | INTRAVENOUS | Status: AC
  Administered 2014-05-20: 900 mg via INTRAVENOUS
  Filled 2014-05-20: qty 36

## 2014-05-20 NOTE — Progress Notes (Addendum)
[place] [phone]   [phone]  OFFICE PROGRESS NOTE  Nicholas Schmitt [name], MD [place] [address]  DIAGNOSIS: Non-small cell carcinoma of lung, stage 4  Primary site: Lung (Right)  Staging method: AJCC 7th Edition  Clinical: Stage IV (T1a, N3, M1b) signed by Nicholas Bears, MD on 02/01/2014 1:42 PM  Summary: Stage IV (T1a, N3, M1b) Prostate cancer  Primary site: Prostate  Clinical: Stage I (T1c, NX, M0) signed by Nicholas Portela, MD on 08/06/2013 1:59 PM  Summary: Stage I (T1c, NX, M0)  PRIOR THERAPY: None  CURRENT THERAPY: Systemic chemotherapy with carboplatin for an AUC of 5 and Alimta 500 mg per meter squared given every 3 weeks. Status post 5 cycles  INTERVAL HISTORY: Nicholas Schmitt 78 y.o. male returns to the clinic today for Follow-up visit accompanied by his wife.  Results were so to proceed with cycle #6 of his systemic chemotherapy with carboplatin and Alimta. Overall he continues to tolerate this treatment without difficulty. He does report some shortness of breath with exertion for instance walking up a hill. He is currently on Symbicort. He reports less dizziness since his antihypertensives were adjusted by his cardiologist.  He denies headaches, increased dyspnea, increased DOE, palpitations, further dizziness.   He does continue to have intermittent back pain.  This is controlled with Oxycodone.  He denies fevers, chills, nausea, vomiting, constipation, diarrhea, numbness/tingling, or any further concerns.       MEDICAL HISTORY: Past Medical History  Diagnosis Date  . Hypertension   . CAD (coronary artery disease)     positive stress test in 2006 led to left heart cath (8/06) showing 95% prox RCA, 90% CFX, and 90% mLAD.  patient had a cypher DES to all 3 lesions  . Hyperlipidemia   . Hearing loss   . AAA (abdominal aortic aneurysm) 11/09    3.2 cm   . Chronic renal insufficiency   .  History of radiation therapy 03/08/04- 04/08/04    prostate 4600 cGy in 3 fractions, radioactive seed implant 05/04/04  . Myocardial infarction 1995  . Pacemaker 2014  . Dysrhythmia   . Shortness of breath     exertion  . Asthma     bronchial  . COPD (chronic obstructive pulmonary disease)   . Cancer 2015    prostate   . Prostate cancer 2005    gleason 7  . Skin cancer     ear    ALLERGIES:  has No Known Allergies.  MEDICATIONS:  Current Outpatient Prescriptions  Medication Sig Dispense Refill  . albuterol (PROVENTIL,VENTOLIN) 90 MCG/ACT inhaler Inhale 2 puffs into the lungs every 6 (six) hours as needed for wheezing or shortness of breath.    [name] amLODipine (NORVASC) 5 MG tablet Take 1 tablet (5 mg total) by mouth daily. 90 tablet 3  . budesonide-formoterol (SYMBICORT) 160-4.5 MCG/ACT inhaler 1 puff twice daily 4 Inhaler 3  . clopidogrel (PLAVIX) 75 MG tablet Take 1 tablet (75 mg total) by mouth daily. 100 tablet 3  . dexamethasone (DECADRON) 4 MG tablet 4 mg by mouth twice a day the day before, day of and day after the chemotherapy every 3 weeks. 40 tablet 1  . folic acid (FOLVITE) 1 MG tablet Take 1 tablet (1 mg total) by mouth daily. 30 tablet 3  . losartan (COZAAR) 50 MG tablet Take 1 tablet (50 mg total) by mouth daily. 90 tablet 3  . Multiple Minerals (  CALCIUM/MAGNESIUM/ZINC PO) Take 1 capsule by mouth every evening.    . Multiple Vitamin (MULTIVITAMIN) tablet Take 1 tablet by mouth daily.     . naproxen sodium (ANAPROX) 220 MG tablet Take 220 mg by mouth as needed (pain).     . nitroGLYCERIN (NITROSTAT) 0.4 MG SL tablet Place 0.4 mg under the tongue every 5 (five) minutes as needed for chest pain (MAX 3 TABLETS).    [name] oxyCODONE (OXY IR/ROXICODONE) 5 MG immediate release tablet Take 1-2 tablets (5-10 mg total) by mouth every 4 (four) hours as needed for moderate pain. (Patient not taking: Reported on 05/20/2014) 30 tablet 0  . prochlorperazine (COMPAZINE) 10 MG tablet Take 1  tablet (10 mg total) by mouth every 6 (six) hours as needed for nausea or vomiting. (Patient not taking: Reported on 05/20/2014) 30 tablet 0   No current facility-administered medications for this visit.    SURGICAL HISTORY:  Past Surgical History  Procedure Laterality Date  . Ptca  2006  . Coronary stent placement    . Permanent pacemaker insertion Bilateral 02/26/13    MDT Adapta L implanted by Dr Rayann Heman for sick sinus syndrome  . Radioactive seed implant  05/04/2004    8000 cGy, Dr Danny Lawless Dr Reece Agar  . Elbow surgery Bilateral 1973  . Insert / replace / remove pacemaker    . Coronary angioplasty    . Mediastinoscopy N/A 01/28/2014    Procedure: MEDIASTINOSCOPY;  Surgeon: Melrose Nakayama, MD;  Location: [facility];  Service: Thoracic;  Laterality: N/A;    REVIEW OF SYSTEMS:  A 10 point review of systems was conducted and is otherwise negative except for what is noted above.      PHYSICAL EXAMINATION: BP 143/68 mmHg  Pulse 77  Temp(Src) 98 F (36.7 C) (Oral)  Resp 21  Ht 5\' 11"  (1.803 m)  Wt 208 lb 3.2 oz (94.439 kg)  BMI 29.05 kg/m2  SpO2 98%   GENERAL: Patient is a well appearing older male in no acute distress HEENT:  Sclerae anicteric.  Oropharynx clear and moist. No ulcerations or evidence of oropharyngeal candidiasis. Neck is supple.  NODES:  No cervical, supraclavicular, or axillary lymphadenopathy palpated.  LUNGS:  Clear to auscultation bilaterally.  No wheezes or rhonchi. HEART:  Regular rate and rhythm. No murmur appreciated. ABDOMEN:  Soft, nontender.  Positive, normoactive bowel sounds. No organomegaly palpated. MSK:  No focal spinal tenderness to palpation. Full range of motion bilaterally in the upper extremities. EXTREMITIES:  No peripheral edema.   SKIN:  Clear with no obvious rashes or skin changes. No nail dyscrasia. NEURO:  Nonfocal. Well oriented.  Appropriate affect.  ECOG PERFORMANCE STATUS: 1 - Symptomatic but completely  ambulatory    LABORATORY DATA: Lab Results  Component Value Date   WBC 4.4 05/20/2014   HGB 9.1* 05/20/2014   HCT 28.7* 05/20/2014   MCV 96.0 05/20/2014   PLT 295 05/20/2014      Chemistry      Component Value Date/Time   NA 140 05/20/2014 0828   NA 141 04/15/2014 0755   K 4.3 05/20/2014 0828   K 5.0 04/15/2014 0755   CL 109 04/15/2014 0755   CO2 22 05/20/2014 0828   CO2 26 04/15/2014 0755   BUN 14.7 05/20/2014 0828   BUN 19 04/15/2014 0755   CREATININE 0.9 05/20/2014 0828   CREATININE 0.8 04/15/2014 0755      Component Value Date/Time   CALCIUM 10.1 05/20/2014 0828   CALCIUM 9.2 04/15/2014 0755  ALKPHOS 103 05/20/2014 0828   ALKPHOS 87 04/15/2014 0755   AST 16 05/20/2014 0828   AST 24 04/15/2014 0755   ALT 16 05/20/2014 0828   ALT 24 04/15/2014 0755   BILITOT 0.69 05/20/2014 0828   BILITOT 1.3* 04/15/2014 0755       RADIOGRAPHIC STUDIES:   CLINICAL DATA: Non-small cell carcinoma of lung, stage 4, unspecified laterality C34.90 (ICD-10-CM), metastatic lung cancer, evaluate response to treatment.  EXAM: CT CHEST, ABDOMEN, AND PELVIS WITH CONTRAST  TECHNIQUE: Multidetector CT imaging of the chest, abdomen and pelvis was performed following the standard protocol during bolus administration of intravenous contrast.  CONTRAST: 178mL OMNIPAQUE IOHEXOL 300 MG/ML SOLN  COMPARISON: PET 01/17/2014 and CT chest 01/09/2014. CT abdomen pelvis 07/02/2013.  FINDINGS: CT CHEST FINDINGS  Mediastinal lymph nodes measure up to 2.0 cm in the prevascular space (previously 2.5 cm) and 2.1 cm in the lower right paratracheal station (previously 2.5 cm). Right hilar adenopathy measures up to 11 mm (previously 2.3 cm). No left hilar or axillary adenopathy. No subcarinal adenopathy. Extensive 3 vessel coronary artery calcification. Pacemaker lead tips terminate in the right atrium and right ventricle. Heart size normal. No pericardial effusion. Tiny hiatal  hernia.  Apical segment right upper lobe nodules measure up to 1.0 x 1.5 cm (previously 1.2 x 1.7 cm). Note is made that image quality is somewhat degraded by respiratory motion. Bronchiectasis and scarring in the right middle lobe, medially. Mild subpleural scarring in the right lower lobe. 4 mm left lower lobe nodule (series 5, image 30), unchanged. No pleural fluid. Airway is otherwise unremarkable.  CT ABDOMEN AND PELVIS FINDINGS  Hepatobiliary: Low-attenuation lesions in the liver measure up to 2.0 cm in the dome, as before. Gallbladder is unremarkable. No biliary ductal dilatation.  Pancreas: Negative.  Spleen: Negative.  Adrenals/Urinary Tract: Right adrenal gland thickening or nodule measures 1.1 x 2.1 cm, stable when remeasured on the prior exam. Low-attenuation lesions in the kidneys measure up to 3.7 cm on the left, as before, indicative of cysts. Ureters are decompressed. Bladder is unremarkable.  Stomach/Bowel: Stomach, small bowel and appendix are otherwise unremarkable. A fair amount of stool is seen in the colon, suggesting constipation. Colon is otherwise unremarkable.  Vascular/Lymphatic: Atherosclerotic calcification of the arterial vasculature. Infrarenal aorta measures up to 3.3 x 3.4 cm above the bifurcation. No pathologically enlarged lymph nodes.  Reproductive: Brachytherapy seeds are seen in the prostate.  Other: Bilateral inguinal hernia repairs with small fat containing bilateral inguinal hernias. No free fluid. Mesenteries and peritoneum are unremarkable. Fat containing left Bochdalek hernia incidentally noted.  Musculoskeletal: A lucent lesion in the right para midline sacrum measures 1.7 x 2.2 cm (series 2, image 97), increased from 7 x 9 mm on 01/17/2014. A lytic lesion in the right L5 vertebral body measures 1.6 cm (series 2, image 90) is stable. Old anterior left rib fracture.  IMPRESSION: 1. Interval response to therapy as  evidenced by improvement in mediastinal and right hilar adenopathy as well as slight decrease in size of a right upper lobe nodule. 2. Sacral metastasis appears larger. Right L5 metastasis is unchanged. 3. Right adrenal thickening or nodule, stable. 4. Extensive 3 vessel coronary artery calcification. 5. Infrarenal aortic aneurysm. 6. Bilateral inguinal hernia repairs with small fat containing bilateral inguinal hernias.   Electronically Signed  By: Lorin Picket M.D.  On: 03/24/2014 10:02      ASSESSMENT AND PLAN: this is a very pleasant 78 years old white male with stage IV non-small  cell lung cancer currently undergoing systemic chemotherapy with carboplatin and Alimta.  He status post 5 cycles. He presents to proceed with cycle #6. Overall is tolerating this chemotherapy relatively well. Patient was discussed with and also seen by Dr. Julien Nordmann. He'll proceed with cycle #6 today as scheduled. He will follow-up in 3 weeks with a restaging CT scan of the chest, abdomen and pelvis with contrast to reevaluate his disease. He well be scheduled in chemotherapy for single agent Alimta only pending the results of his restaging CT scan as this will be possible start of maintenance chemotherapy again pending the outcome of the restaging CT scan. He will continue weekly labs as scheduled  All questions were answered. The patient knows to call the clinic with any problems, questions or concerns. We can certainly see the patient much sooner if necessary.  Carlton Adam, PA-C 05/20/2014  ADDENDUM: Hematology/Oncology Attending: I had a face to face encounter with the patient today. I recommended his care plan. This is a very pleasant 78 years old white male with metastatic non-small cell lung cancer, adenocarcinoma. He is currently undergoing systemic chemotherapy with carboplatin and Alimta status post 5 cycles. He is tolerating his treatment fairly well with no significant adverse  effects. I recommended for the patient to proceed with cycle #6 today as a scheduled. He will come back for follow-up visit in 3 weeks for reevaluation after repeating CT scan of the chest for restaging of his disease.  He could be considered for maintenance chemotherapy with single agent Alimta he has no evidence for disease progression on the upcoming scan. He was advised to call immediately if he has any concerning symptoms in the interval.   Disclaimer: This note was dictated with voice recognition software. Similar sounding words can inadvertently be transcribed and may be missed upon review. Eilleen Kempf., MD 05/20/2014

## 2014-05-20 NOTE — Patient Instructions (Signed)
Continue weekly labs as scheduled Follow-up in 3 weeks with a restaging CT scan of your chest, abdomen and pelvis to reevaluate your disease

## 2014-05-20 NOTE — Patient Instructions (Signed)
[facility] Discharge Instructions for Patients Receiving Chemotherapy  Today you received the following chemotherapy agents Alimta and Carboplatin.  To help prevent nausea and vomiting after your treatment, we encourage you to take your nausea medication as prescribed.Cyanocobalamin, Vitamin B12 injection What is this medicine? CYANOCOBALAMIN (sye an oh koe BAL a min) is a man made form of vitamin B12. Vitamin B12 is used in the growth of healthy blood cells, nerve cells, and proteins in the body. It also helps with the metabolism of fats and carbohydrates. This medicine is used to treat people who can not absorb vitamin B12. This medicine may be used for other purposes; ask your health care provider or pharmacist if you have questions. COMMON BRAND NAME(S): Cyomin, LA-12, Nutri-Twelve, Primabalt What should I tell my health care provider before I take this medicine? They need to know if you have any of these conditions: -kidney disease -Leber's disease -megaloblastic anemia -an unusual or allergic reaction to cyanocobalamin, cobalt, other medicines, foods, dyes, or preservatives -pregnant or trying to get pregnant -breast-feeding How should I use this medicine? This medicine is injected into a muscle or deeply under the skin. It is usually given by a health care professional in a clinic or doctor's office. However, your doctor may teach you how to inject yourself. Follow all instructions. Talk to your pediatrician regarding the use of this medicine in children. Special care may be needed. Overdosage: If you think you have taken too much of this medicine contact a poison control center or emergency room at once. NOTE: This medicine is only for you. Do not share this medicine with others. What if I miss a dose? If you are given your dose at a clinic or doctor's office, call to reschedule your appointment. If you give your own injections and you miss a dose, take it as soon as  you can. If it is almost time for your next dose, take only that dose. Do not take double or extra doses. What may interact with this medicine? -colchicine -heavy alcohol intake This list may not describe all possible interactions. Give your health care provider a list of all the medicines, herbs, non-prescription drugs, or dietary supplements you use. Also tell them if you smoke, drink alcohol, or use illegal drugs. Some items may interact with your medicine. What should I watch for while using this medicine? Visit your doctor or health care professional regularly. You may need blood work done while you are taking this medicine. You may need to follow a special diet. Talk to your doctor. Limit your alcohol intake and avoid smoking to get the best benefit. What side effects may I notice from receiving this medicine? Side effects that you should report to your doctor or health care professional as soon as possible: -allergic reactions like skin rash, itching or hives, swelling of the face, lips, or tongue -blue tint to skin -chest tightness, pain -difficulty breathing, wheezing -dizziness -red, swollen painful area on the leg Side effects that usually do not require medical attention (report to your doctor or health care professional if they continue or are bothersome): -diarrhea -headache This list may not describe all possible side effects. Call your doctor for medical advice about side effects. You may report side effects to FDA at 1-800-FDA-1088. Where should I keep my medicine? Keep out of the reach of children. Store at room temperature between 15 and 30 degrees C (59 and 85 degrees F). Protect from light. Throw away any unused medicine  after the expiration date. NOTE: This sheet is a summary. It may not cover all possible information. If you have questions about this medicine, talk to your doctor, pharmacist, or health care provider.  2015, Elsevier/Gold Standard. (2007-09-10  22:10:20) Pemetrexed injection What is this medicine? PEMETREXED (PEM e TREX ed) is a chemotherapy drug. This medicine affects cells that are rapidly growing, such as cancer cells and cells in your mouth and stomach. It is usually used to treat lung cancers like non-small cell lung cancer and mesothelioma. It may also be used to treat other cancers. This medicine may be used for other purposes; ask your health care provider or pharmacist if you have questions. COMMON BRAND NAME(S): Alimta What should I tell my health care provider before I take this medicine? They need to know if you have any of these conditions: -if you frequently drink alcohol containing beverages -infection (especially a virus infection such as chickenpox, cold sores, or herpes) -kidney disease -liver disease -low blood counts, like low platelets, red bloods, or white blood cells -an unusual or allergic reaction to pemetrexed, mannitol, other medicines, foods, dyes, or preservatives -pregnant or trying to get pregnant -breast-feeding How should I use this medicine? This drug is given as an infusion into a vein. It is administered in a hospital or clinic by a specially trained health care professional. Talk to your pediatrician regarding the use of this medicine in children. Special care may be needed. Overdosage: If you think you have taken too much of this medicine contact a poison control center or emergency room at once. NOTE: This medicine is only for you. Do not share this medicine with others. What if I miss a dose? It is important not to miss your dose. Call your doctor or health care professional if you are unable to keep an appointment. What may interact with this medicine? -aspirin and aspirin-like medicines -medicines to increase blood counts like filgrastim, pegfilgrastim, sargramostim -methotrexate -NSAIDS, medicines for pain and inflammation, like ibuprofen or  naproxen -probenecid -pyrimethamine -vaccines Talk to your doctor or health care professional before taking any of these medicines: -acetaminophen -aspirin -ibuprofen -ketoprofen -naproxen This list may not describe all possible interactions. Give your health care provider a list of all the medicines, herbs, non-prescription drugs, or dietary supplements you use. Also tell them if you smoke, drink alcohol, or use illegal drugs. Some items may interact with your medicine. What should I watch for while using this medicine? Visit your doctor for checks on your progress. This drug may make you feel generally unwell. This is not uncommon, as chemotherapy can affect healthy cells as well as cancer cells. Report any side effects. Continue your course of treatment even though you feel ill unless your doctor tells you to stop. In some cases, you may be given additional medicines to help with side effects. Follow all directions for their use. Call your doctor or health care professional for advice if you get a fever, chills or sore throat, or other symptoms of a cold or flu. Do not treat yourself. This drug decreases your body's ability to fight infections. Try to avoid being around people who are sick. This medicine may increase your risk to bruise or bleed. Call your doctor or health care professional if you notice any unusual bleeding. Be careful brushing and flossing your teeth or using a toothpick because you may get an infection or bleed more easily. If you have any dental work done, tell your dentist you are receiving this  medicine. Avoid taking products that contain aspirin, acetaminophen, ibuprofen, naproxen, or ketoprofen unless instructed by your doctor. These medicines may hide a fever. Call your doctor or health care professional if you get diarrhea or mouth sores. Do not treat yourself. To protect your kidneys, drink water or other fluids as directed while you are taking this medicine. Men  and women must use effective birth control while taking this medicine. You may also need to continue using effective birth control for a time after stopping this medicine. Do not become pregnant while taking this medicine. Tell your doctor right away if you think that you or your partner might be pregnant. There is a potential for serious side effects to an unborn child. Talk to your health care professional or pharmacist for more information. Do not breast-feed an infant while taking this medicine. This medicine may lower sperm counts. What side effects may I notice from receiving this medicine? Side effects that you should report to your doctor or health care professional as soon as possible: -allergic reactions like skin rash, itching or hives, swelling of the face, lips, or tongue -low blood counts - this medicine may decrease the number of white blood cells, red blood cells and platelets. You may be at increased risk for infections and bleeding. -signs of infection - fever or chills, cough, sore throat, pain or difficulty passing urine -signs of decreased platelets or bleeding - bruising, pinpoint red spots on the skin, black, tarry stools, blood in the urine -signs of decreased red blood cells - unusually weak or tired, fainting spells, lightheadedness -breathing problems, like a dry cough -changes in emotions or moods -chest pain -confusion -diarrhea -high blood pressure -mouth or throat sores or ulcers -pain, swelling, warmth in the leg -pain on swallowing -swelling of the ankles, feet, hands -trouble passing urine or change in the amount of urine -vomiting -yellowing of the eyes or skin Side effects that usually do not require medical attention (report to your doctor or health care professional if they continue or are bothersome): -hair loss -loss of appetite -nausea -stomach upset This list may not describe all possible side effects. Call your doctor for medical advice about side  effects. You may report side effects to FDA at 1-800-FDA-1088. Where should I keep my medicine? This drug is given in a hospital or clinic and will not be stored at home. NOTE: This sheet is a summary. It may not cover all possible information. If you have questions about this medicine, talk to your doctor, pharmacist, or health care provider.  2015, Elsevier/Gold Standard. (2008-01-01 13:24:03) Carboplatin injection What is this medicine? CARBOPLATIN (KAR boe pla tin) is a chemotherapy drug. It targets fast dividing cells, like cancer cells, and causes these cells to die. This medicine is used to treat ovarian cancer and many other cancers. This medicine may be used for other purposes; ask your health care provider or pharmacist if you have questions. COMMON BRAND NAME(S): Paraplatin What should I tell my health care provider before I take this medicine? They need to know if you have any of these conditions: -blood disorders -hearing problems -kidney disease -recent or ongoing radiation therapy -an unusual or allergic reaction to carboplatin, cisplatin, other chemotherapy, other medicines, foods, dyes, or preservatives -pregnant or trying to get pregnant -breast-feeding How should I use this medicine? This drug is usually given as an infusion into a vein. It is administered in a hospital or clinic by a specially trained health care professional. Talk to your  pediatrician regarding the use of this medicine in children. Special care may be needed. Overdosage: If you think you have taken too much of this medicine contact a poison control center or emergency room at once. NOTE: This medicine is only for you. Do not share this medicine with others. What if I miss a dose? It is important not to miss a dose. Call your doctor or health care professional if you are unable to keep an appointment. What may interact with this medicine? -medicines for seizures -medicines to increase blood counts  like filgrastim, pegfilgrastim, sargramostim -some antibiotics like amikacin, gentamicin, neomycin, streptomycin, tobramycin -vaccines Talk to your doctor or health care professional before taking any of these medicines: -acetaminophen -aspirin -ibuprofen -ketoprofen -naproxen This list may not describe all possible interactions. Give your health care provider a list of all the medicines, herbs, non-prescription drugs, or dietary supplements you use. Also tell them if you smoke, drink alcohol, or use illegal drugs. Some items may interact with your medicine. What should I watch for while using this medicine? Your condition will be monitored carefully while you are receiving this medicine. You will need important blood work done while you are taking this medicine. This drug may make you feel generally unwell. This is not uncommon, as chemotherapy can affect healthy cells as well as cancer cells. Report any side effects. Continue your course of treatment even though you feel ill unless your doctor tells you to stop. In some cases, you may be given additional medicines to help with side effects. Follow all directions for their use. Call your doctor or health care professional for advice if you get a fever, chills or sore throat, or other symptoms of a cold or flu. Do not treat yourself. This drug decreases your body's ability to fight infections. Try to avoid being around people who are sick. This medicine may increase your risk to bruise or bleed. Call your doctor or health care professional if you notice any unusual bleeding. Be careful brushing and flossing your teeth or using a toothpick because you may get an infection or bleed more easily. If you have any dental work done, tell your dentist you are receiving this medicine. Avoid taking products that contain aspirin, acetaminophen, ibuprofen, naproxen, or ketoprofen unless instructed by your doctor. These medicines may hide a fever. Do not become  pregnant while taking this medicine. Women should inform their doctor if they wish to become pregnant or think they might be pregnant. There is a potential for serious side effects to an unborn child. Talk to your health care professional or pharmacist for more information. Do not breast-feed an infant while taking this medicine. What side effects may I notice from receiving this medicine? Side effects that you should report to your doctor or health care professional as soon as possible: -allergic reactions like skin rash, itching or hives, swelling of the face, lips, or tongue -signs of infection - fever or chills, cough, sore throat, pain or difficulty passing urine -signs of decreased platelets or bleeding - bruising, pinpoint red spots on the skin, black, tarry stools, nosebleeds -signs of decreased red blood cells - unusually weak or tired, fainting spells, lightheadedness -breathing problems -changes in hearing -changes in vision -chest pain -high blood pressure -low blood counts - This drug may decrease the number of white blood cells, red blood cells and platelets. You may be at increased risk for infections and bleeding. -nausea and vomiting -pain, swelling, redness or irritation at the injection site -  pain, tingling, numbness in the hands or feet -problems with balance, talking, walking -trouble passing urine or change in the amount of urine Side effects that usually do not require medical attention (report to your doctor or health care professional if they continue or are bothersome): -hair loss -loss of appetite -metallic taste in the mouth or changes in taste This list may not describe all possible side effects. Call your doctor for medical advice about side effects. You may report side effects to FDA at 1-800-FDA-1088. Where should I keep my medicine? This drug is given in a hospital or clinic and will not be stored at home. NOTE: This sheet is a summary. It may not cover all  possible information. If you have questions about this medicine, talk to your doctor, pharmacist, or health care provider.  2015, Elsevier/Gold Standard. (2007-09-04 14:38:05)    If you develop nausea and vomiting that is not controlled by your nausea medication, call the clinic.   BELOW ARE SYMPTOMS THAT SHOULD BE REPORTED IMMEDIATELY:  *FEVER GREATER THAN 100.5 F  *CHILLS WITH OR WITHOUT FEVER  NAUSEA AND VOMITING THAT IS NOT CONTROLLED WITH YOUR NAUSEA MEDICATION  *UNUSUAL SHORTNESS OF BREATH  *UNUSUAL BRUISING OR BLEEDING  TENDERNESS IN MOUTH AND THROAT WITH OR WITHOUT PRESENCE OF ULCERS  *URINARY PROBLEMS  *BOWEL PROBLEMS  UNUSUAL RASH Items with * indicate a potential emergency and should be followed up as soon as possible.  Feel free to call the clinic you have any questions or concerns. The clinic phone number is [phone].

## 2014-05-20 NOTE — Telephone Encounter (Signed)
gv and printeda ppt sched and avs for pt for DEC...sed added tx

## 2014-05-22 ENCOUNTER — Ambulatory Visit: Payer: Medicare Other | Admitting: Family Medicine

## 2014-05-22 ENCOUNTER — Encounter (HOSPITAL_COMMUNITY): Payer: Self-pay | Admitting: Internal Medicine

## 2014-05-26 ENCOUNTER — Ambulatory Visit (INDEPENDENT_AMBULATORY_CARE_PROVIDER_SITE_OTHER)
Admission: RE | Admit: 2014-05-26 | Discharge: 2014-05-26 | Disposition: A | Discharge status: Home / Self Care | Payer: Medicare Other | Source: Ambulatory Visit | Attending: Family Medicine | Admitting: Family Medicine

## 2014-05-26 ENCOUNTER — Encounter: Payer: Self-pay | Admitting: Family Medicine

## 2014-05-26 ENCOUNTER — Ambulatory Visit (INDEPENDENT_AMBULATORY_CARE_PROVIDER_SITE_OTHER): Payer: Medicare Other | Admitting: Family Medicine

## 2014-05-26 VITALS — BP 120/60 | HR 83 | Temp 97.4°F | Wt 205.0 lb

## 2014-05-26 DIAGNOSIS — J45901 Unspecified asthma with (acute) exacerbation: Secondary | ICD-10-CM

## 2014-05-26 DIAGNOSIS — C3491 Malignant neoplasm of unspecified part of right bronchus or lung: Secondary | ICD-10-CM

## 2014-05-26 DIAGNOSIS — M25551 Pain in right hip: Secondary | ICD-10-CM

## 2014-05-26 DIAGNOSIS — I10 Essential (primary) hypertension: Secondary | ICD-10-CM

## 2014-05-26 NOTE — Progress Notes (Signed)
Pre visit review using our clinic review tool, if applicable. No additional management support is needed unless otherwise documented below in the visit note. 

## 2014-05-26 NOTE — Patient Instructions (Signed)
X-ray right hip today  Stop the amlodipine  Cozaar 50 mg,,,,,,,, one half tab daily in the morning

## 2014-05-26 NOTE — Progress Notes (Signed)
   Subjective:    Patient ID: Nicholas Schmitt, male    DOB: 1935/09/07, 78 y.o.   MRN: [id number]  HPI Nicholas Schmitt is a 78 year old male who has just finished his sixth treatment for lung cancer by Dr.M at the cancer center who comes in today for follow-up of multiple issues  We been down adjusting his blood pressure medication because his BP drop too low and he is getting lightheaded when he stands up. We decrease his Norvasc to 2.5 mg and decreased his Cozaar from 50 mg to 25 mg. BP today 120/60 and he still lightheaded when he stands up. We'll stop the Norvasc  He has underlying COPD and is on Symbicort 1 puff twice a day. He states he saw Dr. Jerilynn Mages last week and the day after began having pain in his right hip. In addition colon cancer he's had a history of prostate cancer.   Review of Systems No trauma to his right hip    Objective:   Physical Exam  Well-developed well-nourished male no acute distress vital signs stable he is afebrile weight steady at 205 pounds BP 120/60, pulse ox 90 on room air      Assessment & Plan:  Low blood pressure........[name] would like to see his BP 140/90 range........ stop the Norvasc... Continue the Cozaar 25 mg daily  New onset atraumatic right hip pain in the setting of lung cancer and a history of prostate cancer........ x-ray hip...Marland KitchenMarland KitchenMarland Kitchen tells me he's due for a PET scan soon  COPD....... continue current therapy

## 2014-05-27 ENCOUNTER — Other Ambulatory Visit (HOSPITAL_BASED_OUTPATIENT_CLINIC_OR_DEPARTMENT_OTHER): Payer: Medicare Other

## 2014-05-27 ENCOUNTER — Telehealth: Payer: Self-pay | Admitting: *Deleted

## 2014-05-27 ENCOUNTER — Telehealth: Payer: Self-pay | Admitting: Family Medicine

## 2014-05-27 ENCOUNTER — Other Ambulatory Visit: Payer: Self-pay | Admitting: *Deleted

## 2014-05-27 DIAGNOSIS — C3491 Malignant neoplasm of unspecified part of right bronchus or lung: Secondary | ICD-10-CM

## 2014-05-27 DIAGNOSIS — C3411 Malignant neoplasm of upper lobe, right bronchus or lung: Secondary | ICD-10-CM

## 2014-05-27 DIAGNOSIS — C44219 Basal cell carcinoma of skin of left ear and external auricular canal: Secondary | ICD-10-CM

## 2014-05-27 LAB — COMPREHENSIVE METABOLIC PANEL (CC13)
ALK PHOS: 93 U/L (ref 40–150)
ALT: 20 U/L (ref 0–55)
AST: 16 U/L (ref 5–34)
Albumin: 3.2 g/dL — ABNORMAL LOW (ref 3.5–5.0)
Anion Gap: 10 mEq/L (ref 3–11)
BILIRUBIN TOTAL: 1.05 mg/dL (ref 0.20–1.20)
BUN: 20.8 mg/dL (ref 7.0–26.0)
CO2: 20 meq/L — AB (ref 22–29)
CREATININE: 0.9 mg/dL (ref 0.7–1.3)
Calcium: 9 mg/dL (ref 8.4–10.4)
Chloride: 108 mEq/L (ref 98–109)
EGFR: 84 mL/min/{1.73_m2} — AB (ref >90)
GLUCOSE: 98 mg/dL (ref 70–140)
Potassium: 4.2 mEq/L (ref 3.5–5.1)
Sodium: 139 mEq/L (ref 136–145)
Total Protein: 6.4 g/dL (ref 6.4–8.3)

## 2014-05-27 LAB — CBC WITH DIFFERENTIAL/PLATELET
BASO%: 0.1 % (ref 0.0–2.0)
Basophils Absolute: 0 10*3/uL (ref 0.0–0.1)
EOS ABS: 0 10*3/uL (ref 0.0–0.5)
EOS%: 1.2 % (ref 0.0–7.0)
HEMATOCRIT: 25.5 % — AB (ref 38.4–49.9)
HGB: 8.2 g/dL — ABNORMAL LOW (ref 13.0–17.1)
LYMPH%: 56.4 % — AB (ref 14.0–49.0)
MCH: 30.5 pg (ref 27.2–33.4)
MCHC: 32.1 g/dL (ref 32.0–36.0)
MCV: 94.8 fL (ref 79.3–98.0)
MONO#: 0.2 10*3/uL (ref 0.1–0.9)
MONO%: 17.5 % — AB (ref 0.0–14.0)
NEUT%: 24.8 % — ABNORMAL LOW (ref 39.0–75.0)
NEUTROS ABS: 0.3 10*3/uL — AB (ref 1.5–6.5)
PLATELETS: 156 10*3/uL (ref 140–400)
RBC: 2.69 10*6/uL — ABNORMAL LOW (ref 4.20–5.82)
RDW: 19 % — ABNORMAL HIGH (ref 11.0–14.6)
WBC: 1.3 10*3/uL — AB (ref 4.0–10.3)
lymph#: 0.7 10*3/uL — ABNORMAL LOW (ref 0.9–3.3)

## 2014-05-27 NOTE — Telephone Encounter (Signed)
Pt called to ask if Dr Sherren Mocha can call him in something for pain. He said he had an xray on 05/26/14 and has not received the results. Pt said the pain is very bad..   Pharmacy; [facility]

## 2014-05-27 NOTE — Telephone Encounter (Signed)
Spoke with patient see result note

## 2014-05-27 NOTE — Telephone Encounter (Signed)
Called patient with neutropenic precautions. Per Dr. Julien Nordmann.  Patient verbalized understanding.

## 2014-05-28 ENCOUNTER — Telehealth: Payer: Self-pay | Admitting: *Deleted

## 2014-05-28 ENCOUNTER — Other Ambulatory Visit: Payer: Self-pay | Admitting: Physician Assistant

## 2014-05-28 ENCOUNTER — Telehealth: Payer: Self-pay | Admitting: Family Medicine

## 2014-05-28 NOTE — Telephone Encounter (Signed)
Spoke with Ms Nicholas Schmitt and explained that Dr Sherren Mocha will not be prescribing pain medication.

## 2014-05-28 NOTE — Telephone Encounter (Signed)
Ms willard called bc pt is not getting any relief from his pain. Pt states the motrin in not working.  Pt states the oncology md advised against going to the ed bc his white blood count is so low. Advised pt to call pcp. She also states when pt tries to walk sometime has to stop and catch his breathe. pls advise

## 2014-05-28 NOTE — Telephone Encounter (Signed)
Received phone call from June stating that patient was having pain in his hip and has had shortness of breathe.  June also stated that PCP had an xray done of patient's hip and stated it was arthritis.   Informed Dr. Julien Nordmann.  Informed June to have patient call PCP.  Per Dr. Julien Nordmann.   June verbalized understanding.

## 2014-05-29 ENCOUNTER — Other Ambulatory Visit: Payer: Self-pay | Admitting: Medical Oncology

## 2014-05-29 ENCOUNTER — Ambulatory Visit (INDEPENDENT_AMBULATORY_CARE_PROVIDER_SITE_OTHER): Payer: Medicare Other | Admitting: *Deleted

## 2014-05-29 ENCOUNTER — Encounter: Payer: Self-pay | Admitting: Internal Medicine

## 2014-05-29 DIAGNOSIS — C3490 Malignant neoplasm of unspecified part of unspecified bronchus or lung: Secondary | ICD-10-CM

## 2014-05-29 DIAGNOSIS — I495 Sick sinus syndrome: Secondary | ICD-10-CM

## 2014-05-29 LAB — MDC_IDC_ENUM_SESS_TYPE_REMOTE
Battery Remaining Longevity: 150 mo
Battery Voltage: 2.8 V
Brady Statistic AP VP Percent: 1 %
Brady Statistic AP VS Percent: 39 %
Lead Channel Impedance Value: 466 Ohm
Lead Channel Pacing Threshold Amplitude: 0.625 V
Lead Channel Pacing Threshold Pulse Width: 0.4 ms
Lead Channel Sensing Intrinsic Amplitude: 8 mV
Lead Channel Setting Pacing Amplitude: 2.5 V
Lead Channel Setting Pacing Pulse Width: 0.4 ms
Lead Channel Setting Sensing Sensitivity: 4 mV
MDC IDC MSMT BATTERY IMPEDANCE: 111 Ohm
MDC IDC MSMT LEADCHNL RA PACING THRESHOLD AMPLITUDE: 0.75 V
MDC IDC MSMT LEADCHNL RA PACING THRESHOLD PULSEWIDTH: 0.4 ms
MDC IDC MSMT LEADCHNL RA SENSING INTR AMPL: 1 mV
MDC IDC MSMT LEADCHNL RV IMPEDANCE VALUE: 575 Ohm
MDC IDC SESS DTM: 20151217132204
MDC IDC SET LEADCHNL RA PACING AMPLITUDE: 2 V
MDC IDC STAT BRADY AS VP PERCENT: 1 %
MDC IDC STAT BRADY AS VS PERCENT: 59 %

## 2014-05-29 MED ORDER — OXYCODONE HCL 5 MG PO TABS: 5.0000 mg | ORAL_TABLET | ORAL | Status: DC | PRN

## 2014-05-29 NOTE — Progress Notes (Signed)
Remote pacemaker transmission.   

## 2014-05-29 NOTE — Progress Notes (Signed)
RX refill requested for OXycodone. I left message to pick up rx ,. Rx locked in injection room.

## 2014-06-02 ENCOUNTER — Ambulatory Visit (INDEPENDENT_AMBULATORY_CARE_PROVIDER_SITE_OTHER)
Admission: RE | Admit: 2014-06-02 | Discharge: 2014-06-02 | Disposition: A | Discharge status: Home / Self Care | Payer: Medicare Other | Source: Ambulatory Visit | Attending: Internal Medicine | Admitting: Internal Medicine

## 2014-06-02 ENCOUNTER — Ambulatory Visit (INDEPENDENT_AMBULATORY_CARE_PROVIDER_SITE_OTHER): Payer: Medicare Other | Admitting: Internal Medicine

## 2014-06-02 ENCOUNTER — Encounter: Payer: Self-pay | Admitting: Internal Medicine

## 2014-06-02 VITALS — BP 104/58 | HR 91 | Ht 72.0 in | Wt 209.0 lb

## 2014-06-02 DIAGNOSIS — J449 Chronic obstructive pulmonary disease, unspecified: Secondary | ICD-10-CM

## 2014-06-02 DIAGNOSIS — R0600 Dyspnea, unspecified: Secondary | ICD-10-CM

## 2014-06-02 DIAGNOSIS — D630 Anemia in neoplastic disease: Secondary | ICD-10-CM

## 2014-06-02 NOTE — Progress Notes (Signed)
Subjective:    Patient ID: Nicholas Schmitt, male    DOB: September 01, 1935,   MRN: [id number]  HPI  34 yowm quit smoking 1995 with dx of [place] doe on "prn symbicort x ? 2005 "  since summer of 2015 progressive doe referred 06/02/2014 to pulmonary clinic by Dr Earlie Server    06/02/2014 1st [facility] office visit/ Nicholas Schmitt   Chief Complaint  Patient presents with  . Pulmonary Consult    Self referral. Pt c/o DOE for the past 6 months- worse x 6 wks with getting out of breath just walking approx 5 steps.   onset was indolent, pattern is overall progressive but still quite variable  present daily=  doe just with walking across the room vs walking across the parking lot, ie not proportionate even on same day, never at rest or sleeping. Not sure symbicort helps though hfa quite poor.  No obvious other patterns in day to day or daytime variabilty or assoc chronic cough or cp or chest tightness, subjective wheeze overt sinus or hb symptoms. No unusual exp hx or h/o childhood pna/ asthma or knowledge of premature birth.  Sleeping ok without nocturnal  or early am exacerbation  of respiratory  c/o's or need for noct saba. Also denies any obvious fluctuation of symptoms with weather or environmental changes or other aggravating or alleviating factors except as outlined above   Current Medications, Allergies, Complete Past Medical History, Past Surgical History, Family History, and Social History were reviewed in [employer] record.           Review of Systems  Constitutional: Negative for fever, chills, activity change, appetite change and unexpected weight change.  HENT: Negative for congestion, dental problem, postnasal drip, rhinorrhea, sneezing, sore throat, trouble swallowing and voice change.   Eyes: Negative for visual disturbance.  Respiratory: Positive for shortness of breath. Negative for cough and choking.   Cardiovascular: Negative for chest pain and leg swelling.    Gastrointestinal: Negative for nausea, vomiting and abdominal pain.  Genitourinary: Negative for difficulty urinating.  Musculoskeletal: Positive for arthralgias.  Skin: Negative for rash.  Psychiatric/Behavioral: Negative for behavioral problems and confusion.       Objective:   Physical Exam  amb mod hoarse wm nad   Wt Readings from Last 3 Encounters:  06/02/14 209 lb (94.802 kg)  05/26/14 205 lb (92.987 kg)  05/20/14 208 lb 3.2 oz (94.439 kg)    Vital signs reviewed  HEENT mild turbinate edema.  Oropharynx no thrush or excess pnd or cobblestoning.  No JVD or cervical adenopathy. Mild accessory muscle hypertrophy. Trachea midline, nl thryroid. Chest was hyperinflated by percussion with diminished breath sounds and moderate increased exp time without wheeze. Hoover sign positive at mid inspiration. Regular rate and rhythm without murmur gallop or rub or increase P2 or edema.  Abd: no hsm, nl excursion. Ext warm without cyanosis or clubbing.     CXR PA and Lateral:   06/02/2014 :  1. Right upper lobe and bibasilar pleural parenchymal thickening consistent with scarring. Mild superimposed acute infiltrate in the right lung base cannot be excluded. 2. Stable cardiomegaly, no CHF. Cardiac pacer in stable position.   Recent Labs Lab 06/03/14 1001  NA 140  K 3.7  CO2 23  BUN 18.5  CREATININE 0.8  GLUCOSE 137    Recent Labs Lab 06/03/14 1001  HGB 7.4*  HCT 22.7*  WBC 2.5*  PLT 84*     Lab Results  Component Value  Date   TSH 2.84 04/15/2014     Lab Results  Component Value Date   PROBNP 146.5* 02/25/2013         Assessment & Plan:

## 2014-06-02 NOTE — Patient Instructions (Signed)
Stay on symbicort 160 Take 2 puffs first thing in am and then another 2 puffs about 12 hours later.   Only use your albuterol (proventil) as a rescue medication to be used if you can't catch your breath by resting or doing a relaxed purse lip breathing pattern.  - The less you use it, the better it will work when you need it. - Ok to use up to 2 puffs  every 4 hours if you must but call for immediate appointment if use goes up over your usual need - Don't leave home without it !!  (think of it like the spare tire for your car)   Try prilosec 20mg   Take 30-60 min before first meal of the day and Pepcid 20 mg one bedtime until return to office  Please remember to go to the lab and x-ray department downstairs for your tests - we will call you with the results when they are available.     Please schedule a follow up office visit in 4 weeks, sooner if needed pfts on return

## 2014-06-03 ENCOUNTER — Other Ambulatory Visit: Payer: Self-pay | Admitting: *Deleted

## 2014-06-03 ENCOUNTER — Ambulatory Visit (HOSPITAL_BASED_OUTPATIENT_CLINIC_OR_DEPARTMENT_OTHER): Payer: Medicare Other | Admitting: Lab

## 2014-06-03 ENCOUNTER — Ambulatory Visit (HOSPITAL_COMMUNITY)
Admission: RE | Admit: 2014-06-03 | Discharge: 2014-06-03 | Disposition: A | Discharge status: Home / Self Care | Payer: BLUE CROSS/BLUE SHIELD | Source: Ambulatory Visit | Attending: Internal Medicine | Admitting: Internal Medicine

## 2014-06-03 DIAGNOSIS — T451X5A Adverse effect of antineoplastic and immunosuppressive drugs, initial encounter: Secondary | ICD-10-CM | POA: Diagnosis not present

## 2014-06-03 DIAGNOSIS — D6481 Anemia due to antineoplastic chemotherapy: Secondary | ICD-10-CM

## 2014-06-03 DIAGNOSIS — C44219 Basal cell carcinoma of skin of left ear and external auricular canal: Secondary | ICD-10-CM

## 2014-06-03 DIAGNOSIS — C3491 Malignant neoplasm of unspecified part of right bronchus or lung: Secondary | ICD-10-CM

## 2014-06-03 LAB — CBC WITH DIFFERENTIAL/PLATELET
BASO%: 0.2 % (ref 0.0–2.0)
BASOS ABS: 0 10*3/uL (ref 0.0–0.1)
EOS ABS: 0.1 10*3/uL (ref 0.0–0.5)
EOS%: 3 % (ref 0.0–7.0)
HCT: 22.7 % — ABNORMAL LOW (ref 38.4–49.9)
HEMOGLOBIN: 7.4 g/dL — AB (ref 13.0–17.1)
LYMPH%: 31.5 % (ref 14.0–49.0)
MCH: 31.1 pg (ref 27.2–33.4)
MCHC: 32.5 g/dL (ref 32.0–36.0)
MCV: 95.8 fL (ref 79.3–98.0)
MONO#: 0.4 10*3/uL (ref 0.1–0.9)
MONO%: 17.1 % — AB (ref 0.0–14.0)
NEUT%: 48.2 % (ref 39.0–75.0)
NEUTROS ABS: 1.2 10*3/uL — AB (ref 1.5–6.5)
PLATELETS: 84 10*3/uL — AB (ref 140–400)
RBC: 2.37 10*6/uL — AB (ref 4.20–5.82)
RDW: 18.8 % — AB (ref 11.0–14.6)
WBC: 2.5 10*3/uL — AB (ref 4.0–10.3)
lymph#: 0.8 10*3/uL — ABNORMAL LOW (ref 0.9–3.3)

## 2014-06-03 LAB — COMPREHENSIVE METABOLIC PANEL (CC13)
ALT: 15 U/L (ref 0–55)
AST: 15 U/L (ref 5–34)
Albumin: 3.2 g/dL — ABNORMAL LOW (ref 3.5–5.0)
Alkaline Phosphatase: 94 U/L (ref 40–150)
Anion Gap: 10 mEq/L (ref 3–11)
BILIRUBIN TOTAL: 0.63 mg/dL (ref 0.20–1.20)
BUN: 18.5 mg/dL (ref 7.0–26.0)
CALCIUM: 9.3 mg/dL (ref 8.4–10.4)
CO2: 23 mEq/L (ref 22–29)
Chloride: 108 mEq/L (ref 98–109)
Creatinine: 0.8 mg/dL (ref 0.7–1.3)
EGFR: 85 mL/min/{1.73_m2} — ABNORMAL LOW (ref >90)
GLUCOSE: 137 mg/dL (ref 70–140)
Potassium: 3.7 mEq/L (ref 3.5–5.1)
Sodium: 140 mEq/L (ref 136–145)
Total Protein: 6.3 g/dL — ABNORMAL LOW (ref 6.4–8.3)

## 2014-06-03 LAB — PREPARE RBC (CROSSMATCH)

## 2014-06-03 LAB — HOLD TUBE, BLOOD BANK

## 2014-06-04 ENCOUNTER — Ambulatory Visit (HOSPITAL_COMMUNITY): Admission: RE | Admit: 2014-06-04 | Payer: Medicare Other | Source: Ambulatory Visit

## 2014-06-04 ENCOUNTER — Ambulatory Visit (HOSPITAL_COMMUNITY)
Admission: RE | Admit: 2014-06-04 | Discharge: 2014-06-04 | Disposition: A | Discharge status: Home / Self Care | Payer: Medicare Other | Source: Ambulatory Visit | Attending: Physician Assistant | Admitting: Physician Assistant

## 2014-06-04 ENCOUNTER — Ambulatory Visit (HOSPITAL_COMMUNITY)
Admission: RE | Admit: 2014-06-04 | Discharge: 2014-06-04 | Disposition: A | Discharge status: Home / Self Care | Payer: Medicare Other | Source: Ambulatory Visit | Attending: Internal Medicine | Admitting: Internal Medicine

## 2014-06-04 ENCOUNTER — Encounter: Payer: Self-pay | Admitting: Internal Medicine

## 2014-06-04 VITALS — BP 154/72 | HR 79 | Temp 98.0°F | Resp 18

## 2014-06-04 DIAGNOSIS — J449 Chronic obstructive pulmonary disease, unspecified: Secondary | ICD-10-CM | POA: Insufficient documentation

## 2014-06-04 DIAGNOSIS — D630 Anemia in neoplastic disease: Secondary | ICD-10-CM | POA: Insufficient documentation

## 2014-06-04 DIAGNOSIS — Z79899 Other long term (current) drug therapy: Secondary | ICD-10-CM | POA: Insufficient documentation

## 2014-06-04 DIAGNOSIS — I10 Essential (primary) hypertension: Secondary | ICD-10-CM | POA: Diagnosis not present

## 2014-06-04 DIAGNOSIS — R0602 Shortness of breath: Secondary | ICD-10-CM | POA: Insufficient documentation

## 2014-06-04 DIAGNOSIS — C3491 Malignant neoplasm of unspecified part of right bronchus or lung: Secondary | ICD-10-CM | POA: Insufficient documentation

## 2014-06-04 DIAGNOSIS — Z923 Personal history of irradiation: Secondary | ICD-10-CM | POA: Insufficient documentation

## 2014-06-04 DIAGNOSIS — I2510 Atherosclerotic heart disease of native coronary artery without angina pectoris: Secondary | ICD-10-CM | POA: Diagnosis not present

## 2014-06-04 DIAGNOSIS — D6481 Anemia due to antineoplastic chemotherapy: Secondary | ICD-10-CM | POA: Diagnosis not present

## 2014-06-04 DIAGNOSIS — T451X5A Adverse effect of antineoplastic and immunosuppressive drugs, initial encounter: Secondary | ICD-10-CM

## 2014-06-04 MED ORDER — IOHEXOL 300 MG/ML  SOLN
100.0000 mL | Freq: Once | INTRAMUSCULAR | Status: AC | PRN
  Administered 2014-06-04: 100 mL via INTRAVENOUS

## 2014-06-04 MED ORDER — ACETAMINOPHEN 325 MG PO TABS
650.0000 mg | ORAL_TABLET | Freq: Once | ORAL | Status: AC
  Administered 2014-06-04: 650 mg via ORAL
  Filled 2014-06-04: qty 2

## 2014-06-04 MED ORDER — SODIUM CHLORIDE 0.9 % IV SOLN: 250.0000 mL | Freq: Once | INTRAVENOUS | Status: DC

## 2014-06-04 MED ORDER — DIPHENHYDRAMINE HCL 25 MG PO CAPS
25.0000 mg | ORAL_CAPSULE | Freq: Once | ORAL | Status: AC
  Administered 2014-06-04: 25 mg via ORAL
  Filled 2014-06-04: qty 1

## 2014-06-04 NOTE — Assessment & Plan Note (Signed)
Spirometry 06/02/14  2.41 (68%) ratio 69   The proper method of use, as well as anticipated side effects, of a metered-dose inhaler are discussed and demonstrated to the patient. Improved effectiveness after extensive coaching during this visit to a level of approximately  75% so will try symbiocrt 160 2bid and bring him back for pfts and if normalizes ratio will reclassify as asthma/ if not, this is still only mild airflow obst and might explain some but not all of his symptoms

## 2014-06-04 NOTE — Assessment & Plan Note (Signed)
-   06/02/2014   Walked RA x two laps @ 185 stopped due to  Sob/ fatigue nl pace, no desat  Not reproducible in office despite hx of sob x 5 feet. Therefore Symptoms are markedly disproportionate to objective findings and not clear this is a lung problem but pt does appear to have difficult airway management issues. DDX of  difficult airways management all start with A and  include Adherence, Ace Inhibitors, Acid Reflux, Active Sinus Disease, Alpha 1 Antitripsin deficiency, Anxiety masquerading as Airways dz,  ABPA,  Asthma, allergy(esp in young), Aspiration (esp in elderly), Adverse effects of DPI,  Active smokers, A bunch of pulmonary emboli and anemia plus two Bs  = Bronchiectasis and Beta blocker use..and one C= CHF  Adherence is always the initial "prime suspect" and is a multilayered concern that requires a "trust but verify" approach in every patient - starting with knowing how to use medications, especially inhalers, correctly, keeping up with refills and understanding the fundamental difference between maintenance and prns vs those medications only taken for a very short course and then stopped and not refilled.  The proper method of use, as well as anticipated side effects, of a metered-dose inhaler are discussed and demonstrated to the patient. Improved effectiveness after extensive coaching during this visit to a level of approximately  75%  baseline was < 25% suggesting he wasn't really getting any benefit from bronchodilators so we haven't established to what extent he is or isn't bronchodilator responsive  ASthma > unlikely s noct symptoms or cough but worth empirical trial > try symbicort 160 2 bid and prn saba and return for pfts  A bunch of PE unlikely given walking study today (wouldn't come and go with variable activity tol as pt reports)  Chf > unlikely s orthopnea hx and bnp so low    Anemia would explain today's  Walking study results  but not the variabilty he describes w/in a  single 24 h period (sometime 5 feet and gives out on same day he can walk across a big parking lot s difficulty) unless he has variable ischemia, which should be a lot worse and reproduced today with such a low hgb but wasn't.   See instructions for specific recommendations which were reviewed directly with the patient who was given a copy with highlighter outlining the key components.

## 2014-06-04 NOTE — Procedures (Signed)
Nicholas Schmitt  Procedure Note  Nicholas Schmitt PIR:[id number] DOB: 1935-10-26 DOA: 06/04/2014   PCP: [name], MD   Associated Diagnosis: Antineoplastic chemotherapy induced anemia (285.3 , [id number])  Procedure Note: Transfusion of 2 units PRBCs   Condition During Procedure:  Pt tolerated well   Condition at Discharge:  Alert, oriented, ambulatory; no complications noted   Tamala Julian, Salley Slaughter, [facility]

## 2014-06-04 NOTE — Assessment & Plan Note (Signed)
Defer rx to Dr Earlie Server, note comments re role of anemia has on dyspnea a/p

## 2014-06-05 LAB — TYPE AND SCREEN
ABO/RH(D): O POS
ANTIBODY SCREEN: NEGATIVE
UNIT DIVISION: 0
Unit division: 0

## 2014-06-09 ENCOUNTER — Telehealth: Payer: Self-pay | Admitting: *Deleted

## 2014-06-09 ENCOUNTER — Encounter: Payer: Self-pay | Admitting: Pharmacist

## 2014-06-09 NOTE — Telephone Encounter (Signed)
Contacted pt to correlate possible symptoms with SVT recordings discovered on remote. Pt does not have recollection of date/time I referenced; no symptoms. Pt currently on chemo for lung cancer. Pt does have episodes of SOB. Pt stopped BP meds per Dr. Sherren Mocha due to light-headedness when standing up. Pt understands to send an extra remote if sudden symptoms of pre/syncope or SOB occur.

## 2014-06-10 ENCOUNTER — Other Ambulatory Visit: Payer: Self-pay | Admitting: *Deleted

## 2014-06-10 ENCOUNTER — Other Ambulatory Visit: Payer: Medicare Other

## 2014-06-10 ENCOUNTER — Other Ambulatory Visit (HOSPITAL_BASED_OUTPATIENT_CLINIC_OR_DEPARTMENT_OTHER): Payer: Medicare Other

## 2014-06-10 ENCOUNTER — Encounter: Payer: Self-pay | Admitting: Internal Medicine

## 2014-06-10 ENCOUNTER — Ambulatory Visit: Payer: Medicare Other

## 2014-06-10 ENCOUNTER — Telehealth: Payer: Self-pay | Admitting: Internal Medicine

## 2014-06-10 ENCOUNTER — Encounter: Payer: Self-pay | Admitting: *Deleted

## 2014-06-10 ENCOUNTER — Ambulatory Visit (HOSPITAL_BASED_OUTPATIENT_CLINIC_OR_DEPARTMENT_OTHER): Payer: Medicare Other | Admitting: Internal Medicine

## 2014-06-10 ENCOUNTER — Ambulatory Visit: Payer: Medicare Other | Admitting: Internal Medicine

## 2014-06-10 VITALS — BP 156/76 | HR 81 | Temp 97.7°F | Resp 18 | Ht 72.0 in | Wt 207.6 lb

## 2014-06-10 DIAGNOSIS — C3490 Malignant neoplasm of unspecified part of unspecified bronchus or lung: Secondary | ICD-10-CM

## 2014-06-10 DIAGNOSIS — C3411 Malignant neoplasm of upper lobe, right bronchus or lung: Secondary | ICD-10-CM

## 2014-06-10 DIAGNOSIS — E278 Other specified disorders of adrenal gland: Secondary | ICD-10-CM

## 2014-06-10 DIAGNOSIS — C3491 Malignant neoplasm of unspecified part of right bronchus or lung: Secondary | ICD-10-CM

## 2014-06-10 LAB — COMPREHENSIVE METABOLIC PANEL (CC13)
ALT: 17 U/L (ref 0–55)
AST: 17 U/L (ref 5–34)
Albumin: 3.5 g/dL (ref 3.5–5.0)
Alkaline Phosphatase: 115 U/L (ref 40–150)
Anion Gap: 10 mEq/L (ref 3–11)
BILIRUBIN TOTAL: 0.82 mg/dL (ref 0.20–1.20)
BUN: 16.4 mg/dL (ref 7.0–26.0)
CO2: 20 mEq/L — ABNORMAL LOW (ref 22–29)
Calcium: 9.8 mg/dL (ref 8.4–10.4)
Chloride: 107 mEq/L (ref 98–109)
Creatinine: 0.9 mg/dL (ref 0.7–1.3)
EGFR: 85 mL/min/{1.73_m2} — ABNORMAL LOW (ref >90)
Glucose: 161 mg/dl — ABNORMAL HIGH (ref 70–140)
Potassium: 4.2 mEq/L (ref 3.5–5.1)
Sodium: 137 mEq/L (ref 136–145)
Total Protein: 6.9 g/dL (ref 6.4–8.3)

## 2014-06-10 LAB — CBC WITH DIFFERENTIAL/PLATELET
BASO%: 0 % (ref 0.0–2.0)
BASOS ABS: 0 10*3/uL (ref 0.0–0.1)
EOS ABS: 0 10*3/uL (ref 0.0–0.5)
EOS%: 0 % (ref 0.0–7.0)
HEMATOCRIT: 30.4 % — AB (ref 38.4–49.9)
HEMOGLOBIN: 9.8 g/dL — AB (ref 13.0–17.1)
LYMPH%: 8.9 % — ABNORMAL LOW (ref 14.0–49.0)
MCH: 30.2 pg (ref 27.2–33.4)
MCHC: 32.2 g/dL (ref 32.0–36.0)
MCV: 93.5 fL (ref 79.3–98.0)
MONO#: 0.2 10*3/uL (ref 0.1–0.9)
MONO%: 4.4 % (ref 0.0–14.0)
NEUT#: 4.8 10*3/uL (ref 1.5–6.5)
NEUT%: 86.7 % — AB (ref 39.0–75.0)
PLATELETS: 216 10*3/uL (ref 140–400)
RBC: 3.25 10*6/uL — AB (ref 4.20–5.82)
RDW: 19.9 % — ABNORMAL HIGH (ref 11.0–14.6)
WBC: 5.5 10*3/uL (ref 4.0–10.3)
lymph#: 0.5 10*3/uL — ABNORMAL LOW (ref 0.9–3.3)

## 2014-06-10 NOTE — Telephone Encounter (Signed)
Pt confirmed labs/ov per 12/28 POF, gave pt AVS..... KJ, cancel chemo per MD

## 2014-06-10 NOTE — Progress Notes (Signed)
Emailed [facility], Tammy to send tissue for PDL 1 22C3 (Keytruda).

## 2014-06-10 NOTE — Progress Notes (Signed)
[facility] [phone]   [phone]  OFFICE PROGRESS NOTE  [name],[name] Resides, MD [place] [address]  DIAGNOSIS: Non-small cell carcinoma of lung, stage 4  Primary site: Lung (Right)  Staging method: AJCC 7th Edition  Clinical: Stage IV (T1a, N3, M1b) signed by Curt Bears, MD on 02/01/2014 1:42 PM  Summary: Stage IV (T1a, N3, M1b) Prostate cancer  Primary site: Prostate  Clinical: Stage I (T1c, NX, M0) signed by Wyatt Portela, MD on 08/06/2013 1:59 PM  Summary: Stage I (T1c, NX, M0)  PRIOR THERAPY: Systemic chemotherapy with carboplatin for an AUC of 5 and Alimta 500 mg per meter squared given every 3 weeks. Status post 6 cycles, last dose was given 05/20/2014 discontinued secondary to disease progression.  CURRENT THERAPY:   INTERVAL HISTORY: Nicholas Schmitt 78 y.o. male returns to the clinic today for Follow-up visit accompanied by his wife. The patient is feeling fine today with no specific complaints except for pain in the lower back and hip area. He tolerated his previous treatment with carboplatin and Alimta fairly well with no significant adverse effects except for mild fatigue secondary to chemotherapy-induced anemia. He denied having any significant fever or chills, no nausea or vomiting. The patient denied having any significant chest pain, shortness of breath except with exertion, cough or hemoptysis. He had repeat CT scan of the chest, abdomen and pelvis performed recently and he is here for evaluation and discussion of his scan results.  MEDICAL HISTORY: Past Medical History  Diagnosis Date  . Hypertension   . CAD (coronary artery disease)     positive stress test in 2006 led to left heart cath (8/06) showing 95% prox RCA, 90% CFX, and 90% mLAD.  patient had a cypher DES to all 3 lesions  . Hyperlipidemia   . Hearing loss   . AAA (abdominal aortic aneurysm) 11/09    3.2 cm   . Chronic  renal insufficiency   . History of radiation therapy 03/08/04- 04/08/04    prostate 4600 cGy in 3 fractions, radioactive seed implant 05/04/04  . Myocardial infarction 1995  . Pacemaker 2014  . Dysrhythmia   . Shortness of breath     exertion  . Asthma     bronchial  . COPD (chronic obstructive pulmonary disease)   . Cancer 2015    prostate   . Prostate cancer 2005    gleason 7  . Skin cancer     ear    ALLERGIES:  has No Known Allergies.  MEDICATIONS:  Current Outpatient Prescriptions  Medication Sig Dispense Refill  . albuterol (PROVENTIL,VENTOLIN) 90 MCG/ACT inhaler Inhale 2 puffs into the lungs every 6 (six) hours as needed for wheezing or shortness of breath.    . budesonide-formoterol (SYMBICORT) 160-4.5 MCG/ACT inhaler 1 puff twice daily 4 Inhaler 3  . clopidogrel (PLAVIX) 75 MG tablet Take 1 tablet (75 mg total) by mouth daily. 100 tablet 3  . dexamethasone (DECADRON) 4 MG tablet 4 mg by mouth twice a day the day before, day of and day after the chemotherapy every 3 weeks. 40 tablet 1  . folic acid (FOLVITE) 1 MG tablet Take 1 tablet (1 mg total) by mouth daily. 30 tablet 3  . losartan (COZAAR) 50 MG tablet Take 1 tablet (50 mg total) by mouth daily. (Patient taking differently: Half tab daily) 90 tablet 3  . Multiple Minerals (CALCIUM/MAGNESIUM/ZINC PO) Take 1 capsule by mouth every evening.    [name]  Multiple Vitamin (MULTIVITAMIN) tablet Take 1 tablet by mouth daily.     . naproxen sodium (ANAPROX) 220 MG tablet Take 220 mg by mouth as needed (pain).     . nitroGLYCERIN (NITROSTAT) 0.4 MG SL tablet Place 0.4 mg under the tongue every 5 (five) minutes as needed for chest pain (MAX 3 TABLETS).    [name] oxyCODONE (OXY IR/ROXICODONE) 5 MG immediate release tablet Take 1-2 tablets (5-10 mg total) by mouth every 4 (four) hours as needed for moderate pain. 30 tablet 0  . prochlorperazine (COMPAZINE) 10 MG tablet Take 1 tablet (10 mg total) by mouth every 6 (six) hours as needed for  nausea or vomiting. 30 tablet 0   No current facility-administered medications for this visit.    SURGICAL HISTORY:  Past Surgical History  Procedure Laterality Date  . Ptca  2006  . Coronary stent placement    . Permanent pacemaker insertion Bilateral 02/26/13    MDT Adapta L implanted by Dr Rayann Heman for sick sinus syndrome  . Radioactive seed implant  05/04/2004    8000 cGy, Dr Danny Lawless Dr Reece Agar  . Elbow surgery Bilateral 1973  . Insert / replace / remove pacemaker    . Coronary angioplasty    . Mediastinoscopy N/A 01/28/2014    Procedure: MEDIASTINOSCOPY;  Surgeon: Melrose Nakayama, MD;  Location: [facility];  Service: Thoracic;  Laterality: N/A;  . Permanent pacemaker insertion N/A 02/26/2013    Procedure: PERMANENT PACEMAKER INSERTION;  Surgeon: [name], MD;  Location: [facility];  Service: Cardiovascular;  Laterality: N/A;    REVIEW OF SYSTEMS:  Constitutional: positive for fatigue Eyes: negative Ears, nose, mouth, throat, and face: negative Respiratory: negative Cardiovascular: negative Gastrointestinal: negative Genitourinary:negative Integument/breast: negative Hematologic/lymphatic: negative Musculoskeletal:negative Neurological: negative Behavioral/Psych: negative Endocrine: negative Allergic/Immunologic: negative   PHYSICAL EXAMINATION: General appearance: alert, cooperative, fatigued and no distress Head: Normocephalic, without obvious abnormality, atraumatic Neck: no adenopathy, no carotid bruit, supple, symmetrical, trachea midline and thyroid not enlarged, symmetric, no tenderness/mass/nodules Lymph nodes: Cervical, supraclavicular, and axillary nodes normal. Resp: clear to auscultation bilaterally Back: symmetric, no curvature. ROM normal. No CVA tenderness. Cardio: regular rate and rhythm, S1, S2 normal, no murmur, click, rub or gallop GI: soft, non-tender; bowel sounds normal; no masses,  no organomegaly Extremities: extremities normal,  atraumatic, no cyanosis or edema Neurologic: Alert and oriented X 3, normal strength and tone. Normal symmetric reflexes. Normal coordination and gait  ECOG PERFORMANCE STATUS: 1 - Symptomatic but completely ambulatory  Blood pressure 156/76, pulse 81, temperature 97.7 F (36.5 C), temperature source Oral, resp. rate 18, height 6' (1.829 m), weight 207 lb 9.6 oz (94.167 kg), SpO2 98 %.  LABORATORY DATA: Lab Results  Component Value Date   WBC 5.5 06/10/2014   HGB 9.8* 06/10/2014   HCT 30.4* 06/10/2014   MCV 93.5 06/10/2014   PLT 216 06/10/2014      Chemistry      Component Value Date/Time   NA 137 06/10/2014 0835   NA 141 04/15/2014 0755   K 4.2 06/10/2014 0835   K 5.0 04/15/2014 0755   CL 109 04/15/2014 0755   CO2 20* 06/10/2014 0835   CO2 26 04/15/2014 0755   BUN 16.4 06/10/2014 0835   BUN 19 04/15/2014 0755   CREATININE 0.9 06/10/2014 0835   CREATININE 0.8 04/15/2014 0755      Component Value Date/Time   CALCIUM 9.8 06/10/2014 0835   CALCIUM 9.2 04/15/2014 0755   ALKPHOS 115 06/10/2014 0835  ALKPHOS 87 04/15/2014 0755   AST 17 06/10/2014 0835   AST 24 04/15/2014 0755   ALT 17 06/10/2014 0835   ALT 24 04/15/2014 0755   BILITOT 0.82 06/10/2014 0835   BILITOT 1.3* 04/15/2014 0755       RADIOGRAPHIC STUDIES: Dg Chest 2 View  06/02/2014   CLINICAL DATA:  Shortness of breath.  EXAM: CHEST  2 VIEW  COMPARISON:  CT 03/24/2013. Chest x-ray 01/24/2014, 02/27/2013, 07/22/2010.  FINDINGS: Cardiac pacer noted with lead tips in right atrium and right ventricle. Heart size normal. Right upper lobe and bibasilar pleural parenchymal thickening noted consistent with scarring. Mild superimposed infiltrate right lung base cannot be excluded. No pleural effusion or pneumothorax. Stable cardiomegaly normal pulmonary vascularity a cardiac pacer lead tips in right atrium and right ventricle. No acute bony abnormality.  IMPRESSION: 1. Right upper lobe and bibasilar pleural parenchymal  thickening consistent with scarring. Mild superimposed acute infiltrate in the right lung base cannot be excluded. 2. Stable cardiomegaly, no CHF.  Cardiac pacer in stable position.   Electronically Signed   By: Marcello Moores  Register   On: 06/02/2014 12:04   Dg Hip Complete Right  05/26/2014   CLINICAL DATA:  Lung cancer.  Prostate cancer.  Right hip pain.  EXAM: RIGHT HIP - COMPLETE 2+ VIEW  COMPARISON:  03/24/2014  FINDINGS: No acute fracture. No dislocation. Joint spaces are maintained. Prostate cancer brachy therapy pellets in place. Vascular calcifications. No destructive bone lesion.  IMPRESSION: No acute bony pathology.   Electronically Signed   By: Maryclare Bean M.D.   On: 05/26/2014 13:53   Ct Chest W Contrast  06/04/2014   CLINICAL DATA:  Stage 4 non-small-cell cancer post radiation therapy, ongoing chemotherapy, restaging; history prostate cancer 2004, COPD with shortness of breath, coronary artery disease post MI and coronary stenting, hypertension  EXAM: CT CHEST, ABDOMEN, AND PELVIS WITH CONTRAST  TECHNIQUE: Multidetector CT imaging of the chest, abdomen and pelvis was performed following the standard protocol during bolus administration of intravenous contrast. Sagittal and coronal MPR images reconstructed from axial data set.  CONTRAST:  163mL OMNIPAQUE IOHEXOL 300 MG/ML  SOLN  COMPARISON:  CT chest abdomen and pelvis 03/24/2014  FINDINGS: CT CHEST FINDINGS  Thoracic vascular structures patent on nondedicated exam.  Scattered atherosclerotic calcifications aorta and throughout coronary system.  Mediastinal adenopathy again identified;  RIGHT prevascular node 20 mm short axis image 19 unchanged;  Lower RIGHT paratracheal node 21 mm image 20 unchanged;  Node anterior to RIGHT mainstem bronchus 18 mm image 238 unchanged;  15 mm short axis RIGHT hilar node image 26 previously 11 mm.  No new thoracic adenopathy.  Posterior LEFT diaphragmatic defect, Bochdalek type with herniation of fat.  Pacemaker leads  RIGHT atrium and RIGHT ventricle.  New anterior RIGHT upper lobe spiculated nodule compatible with neoplasm, 12 x 9 mm image 13 with associated pleural thickening.  Spiculated poorly defined posterior RIGHT upper lobe nodular density 15 x 9 mm image 12 previously 15 x 10 mm.  6 mm RIGHT apex nodule image 9 unchanged.  3 mm LEFT lower lobe nodule image 32 unchanged.  Scattered peribronchial thickening and interstitial prominence unchanged.  Paramediastinal atelectasis or radiation change in medial RIGHT middle lobe.  No additional mass, nodule, infiltrate, pleural effusion, or pneumothorax.  No definite osseous metastases.  CT ABDOMEN AND PELVIS FINDINGS  Low-attenuation hepatic lesions likely cysts unchanged largest 20 x 14 mm LEFT lobe image 44.  BILATERAL renal cysts, largest 3.6 x 3.5 cm  upper pole LEFT kidney image 56 unchanged.  Nodular enlargement of RIGHT adrenal gland 3.0 x 1.3 cm increased in size since previous study when this measured approximately 2.1 x 1.4 cm  Liver, spleen, pancreas, kidneys, and LEFT adrenal gland otherwise normal.  Normal appendix.  Stomach and bowel loops normal appearance.  Unremarkable bladder and ureters.  Brachytherapy seed implants in prostate bed.  LEFT inguinal hernia containing fat  No mass, adenopathy, free fluid or inflammatory process.  Aneurysmal dilatation distal abdominal aorta 3.4 x 3.3 cm image 85 unchanged.  Subtle area of osseous lucency LEFT lateral aspect of L2 vertebral body.  Probable lytic metastasis RIGHT lateral aspect of L5 vertebral body 20 x 14 mm image 92.  Probable lytic osseous metastasis RIGHT sacrum 27 x 21 mm image 97, slightly increased.  Degenerative disc disease changes lumbar spine.  IMPRESSION: Stable spiculated density posterior RIGHT upper lobe 15 x 9 mm.  New 12 x 9 mm spiculated anterior LEFT upper lobe density with associated pleural thickening compatible with neoplasm, primary versus metastatic.  Stable BILATERAL tiny lung nodules.   Increased size of RIGHT adrenal mass.  Stable hepatic and renal cysts.  LEFT inguinal hernia.  Stable abdominal aortic aneurysm 3.4 x 3.3 cm.  Probable osseous metastasis RIGHT L5 vertebra and RIGHT sacrum, both slightly larger than on previous exam.   Electronically Signed   By: Lavonia Dana M.D.   On: 06/04/2014 09:34   Ct Abdomen Pelvis W Contrast  06/04/2014   CLINICAL DATA:  Stage 4 non-small-cell cancer post radiation therapy, ongoing chemotherapy, restaging; history prostate cancer 2004, COPD with shortness of breath, coronary artery disease post MI and coronary stenting, hypertension  EXAM: CT CHEST, ABDOMEN, AND PELVIS WITH CONTRAST  TECHNIQUE: Multidetector CT imaging of the chest, abdomen and pelvis was performed following the standard protocol during bolus administration of intravenous contrast. Sagittal and coronal MPR images reconstructed from axial data set.  CONTRAST:  133mL OMNIPAQUE IOHEXOL 300 MG/ML  SOLN  COMPARISON:  CT chest abdomen and pelvis 03/24/2014  FINDINGS: CT CHEST FINDINGS  Thoracic vascular structures patent on nondedicated exam.  Scattered atherosclerotic calcifications aorta and throughout coronary system.  Mediastinal adenopathy again identified;  RIGHT prevascular node 20 mm short axis image 19 unchanged;  Lower RIGHT paratracheal node 21 mm image 20 unchanged;  Node anterior to RIGHT mainstem bronchus 18 mm image 238 unchanged;  15 mm short axis RIGHT hilar node image 26 previously 11 mm.  No new thoracic adenopathy.  Posterior LEFT diaphragmatic defect, Bochdalek type with herniation of fat.  Pacemaker leads RIGHT atrium and RIGHT ventricle.  New anterior RIGHT upper lobe spiculated nodule compatible with neoplasm, 12 x 9 mm image 13 with associated pleural thickening.  Spiculated poorly defined posterior RIGHT upper lobe nodular density 15 x 9 mm image 12 previously 15 x 10 mm.  6 mm RIGHT apex nodule image 9 unchanged.  3 mm LEFT lower lobe nodule image 32 unchanged.   Scattered peribronchial thickening and interstitial prominence unchanged.  Paramediastinal atelectasis or radiation change in medial RIGHT middle lobe.  No additional mass, nodule, infiltrate, pleural effusion, or pneumothorax.  No definite osseous metastases.  CT ABDOMEN AND PELVIS FINDINGS  Low-attenuation hepatic lesions likely cysts unchanged largest 20 x 14 mm LEFT lobe image 44.  BILATERAL renal cysts, largest 3.6 x 3.5 cm upper pole LEFT kidney image 56 unchanged.  Nodular enlargement of RIGHT adrenal gland 3.0 x 1.3 cm increased in size since previous study when this measured  approximately 2.1 x 1.4 cm  Liver, spleen, pancreas, kidneys, and LEFT adrenal gland otherwise normal.  Normal appendix.  Stomach and bowel loops normal appearance.  Unremarkable bladder and ureters.  Brachytherapy seed implants in prostate bed.  LEFT inguinal hernia containing fat  No mass, adenopathy, free fluid or inflammatory process.  Aneurysmal dilatation distal abdominal aorta 3.4 x 3.3 cm image 85 unchanged.  Subtle area of osseous lucency LEFT lateral aspect of L2 vertebral body.  Probable lytic metastasis RIGHT lateral aspect of L5 vertebral body 20 x 14 mm image 92.  Probable lytic osseous metastasis RIGHT sacrum 27 x 21 mm image 97, slightly increased.  Degenerative disc disease changes lumbar spine.  IMPRESSION: Stable spiculated density posterior RIGHT upper lobe 15 x 9 mm.  New 12 x 9 mm spiculated anterior LEFT upper lobe density with associated pleural thickening compatible with neoplasm, primary versus metastatic.  Stable BILATERAL tiny lung nodules.  Increased size of RIGHT adrenal mass.  Stable hepatic and renal cysts.  LEFT inguinal hernia.  Stable abdominal aortic aneurysm 3.4 x 3.3 cm.  Probable osseous metastasis RIGHT L5 vertebra and RIGHT sacrum, both slightly larger than on previous exam.   Electronically Signed   By: Lavonia Dana M.D.   On: 06/04/2014 09:34    ASSESSMENT AND PLAN: this is a very pleasant 78  years old white male with stage IV non-small cell lung cancer completed systemic chemotherapy with carboplatin and Alimta status post 6 cycles and tolerated his treatment fairly well.  The recent CT scan of the chest, abdomen and pelvis showed evidence for disease progression with new spiculated left upper lobe nodule in addition to increased in the size of the right adrenal gland. I discussed the scan results with the patient and his wife. I recommended for him to discontinue his current treatment with carboplatin and Alimta at this point. I discussed with the patient other treatment options including consideration for immunotherapy with Nivolumab or Pembrolizumab.  I will ask the pathology department to send his tissue to be tested for PDL 1 expression. I will see the patient back for follow-up visit in 3 weeks for reevaluation and consideration for starting treatment with immunotherapy. For the back pain, I will refer the patient to radiation oncology for consideration of palliative radiotherapy to the sacral bone metastasis.  The patient was advised to call immediately if he has any concerning symptoms in the interval. The patient voices understanding of current disease status and treatment options and is in agreement with the current care plan.  All questions were answered. The patient knows to call the clinic with any problems, questions or concerns. We can certainly see the patient much sooner if necessary.  I spent 15 minutes counseling the patient face to face. The total time spent in the appointment was 25 minutes.  Disclaimer: This note was dictated with voice recognition software. Similar sounding words can inadvertently be transcribed and may not be corrected upon review.

## 2014-06-13 DIAGNOSIS — IMO0001 Reserved for inherently not codable concepts without codable children: Secondary | ICD-10-CM

## 2014-06-13 HISTORY — DX: Reserved for inherently not codable concepts without codable children: IMO0001

## 2014-06-16 NOTE — Progress Notes (Addendum)
Histology and Location of Primary Cancer: Non-small cell carcinoma of lung, stage 4  Sites of Visceral and Bony Metastatic Disease: history of prostate cancer, med. Adenopathy, small right adrenal mass, right upper lobe nodule as well as extensive thoracic lymphadenopathy right supraclavicular lymph node. In addition there was adrenal metastasis and metastasis involving the right L5 vertebral body and right sacrum.  Location(s) of Symptomatic Metastases: L5 and right sacrum  Past/Anticipated chemotherapy by medical oncology, if any: Systemic chemotherapy with carboplatin for an AUC of 5 and Alimta 500 mg per meter squared given every 3 weeks. Status post 6 cycles, last dose was given 05/20/2014 discontinued secondary to disease progression. Possible immunotherapy per Dr. Julien Nordmann.  Pain on a scale of 0-10 is: 5/10 in his right mid back    If Spine Met(s), symptoms, if any, include:  Bowel/Bladder retention or incontinence (please describe): no  Numbness or weakness in extremities (please describe): no  Current Decadron regimen, if applicable: no  Ambulatory status? Walker? Wheelchair?: ambulatory  SAFETY ISSUES:  Prior radiation? 03/08/04- 04/08/04  prostate 4600 cGy in 3 fractions, radioactive seed implant 05/04/04   Pacemaker/ICD? Yes - left chest by Dr. Joylene Grapes  Possible current pregnancy? no  Is the patient on methotrexate? no  Current Complaints / other details:  Patient here with his fiance.  He started taking prednisone yesterday and reports his back pain has improved today.  He reports that he woke up Monday night and felt like he was dying from shortness of breath.  He said it resolved when he sat up straight.

## 2014-06-17 ENCOUNTER — Other Ambulatory Visit: Payer: Self-pay | Admitting: *Deleted

## 2014-06-17 ENCOUNTER — Telehealth: Payer: Self-pay | Admitting: Internal Medicine

## 2014-06-17 ENCOUNTER — Telehealth: Payer: Self-pay | Admitting: *Deleted

## 2014-06-17 ENCOUNTER — Ambulatory Visit (HOSPITAL_COMMUNITY)
Admission: RE | Admit: 2014-06-17 | Discharge: 2014-06-17 | Disposition: A | Discharge status: Home / Self Care | Payer: Medicare Other | Source: Ambulatory Visit | Attending: Nurse Practitioner | Admitting: Nurse Practitioner

## 2014-06-17 ENCOUNTER — Other Ambulatory Visit (HOSPITAL_BASED_OUTPATIENT_CLINIC_OR_DEPARTMENT_OTHER): Payer: Medicare Other

## 2014-06-17 ENCOUNTER — Other Ambulatory Visit (HOSPITAL_COMMUNITY)
Admission: RE | Admit: 2014-06-17 | Discharge: 2014-06-17 | Disposition: A | Discharge status: Home / Self Care | Payer: Medicare Other | Source: Ambulatory Visit | Attending: Internal Medicine | Admitting: Internal Medicine

## 2014-06-17 ENCOUNTER — Ambulatory Visit (HOSPITAL_BASED_OUTPATIENT_CLINIC_OR_DEPARTMENT_OTHER): Payer: Medicare Other | Admitting: Nurse Practitioner

## 2014-06-17 ENCOUNTER — Encounter (HOSPITAL_COMMUNITY): Payer: Self-pay

## 2014-06-17 ENCOUNTER — Telehealth: Payer: Self-pay | Admitting: Nurse Practitioner

## 2014-06-17 VITALS — BP 134/81 | HR 82 | Temp 98.0°F | Resp 18 | Ht 72.0 in | Wt 208.6 lb

## 2014-06-17 DIAGNOSIS — I700 Atherosclerosis of aorta: Secondary | ICD-10-CM | POA: Diagnosis not present

## 2014-06-17 DIAGNOSIS — C771 Secondary and unspecified malignant neoplasm of intrathoracic lymph nodes: Secondary | ICD-10-CM | POA: Insufficient documentation

## 2014-06-17 DIAGNOSIS — I771 Stricture of artery: Secondary | ICD-10-CM | POA: Diagnosis not present

## 2014-06-17 DIAGNOSIS — M549 Dorsalgia, unspecified: Secondary | ICD-10-CM | POA: Diagnosis present

## 2014-06-17 DIAGNOSIS — R918 Other nonspecific abnormal finding of lung field: Secondary | ICD-10-CM | POA: Diagnosis not present

## 2014-06-17 DIAGNOSIS — R21 Rash and other nonspecific skin eruption: Secondary | ICD-10-CM

## 2014-06-17 DIAGNOSIS — C7971 Secondary malignant neoplasm of right adrenal gland: Secondary | ICD-10-CM | POA: Insufficient documentation

## 2014-06-17 DIAGNOSIS — R590 Localized enlarged lymph nodes: Secondary | ICD-10-CM | POA: Diagnosis not present

## 2014-06-17 DIAGNOSIS — J984 Other disorders of lung: Secondary | ICD-10-CM | POA: Insufficient documentation

## 2014-06-17 DIAGNOSIS — C3490 Malignant neoplasm of unspecified part of unspecified bronchus or lung: Secondary | ICD-10-CM

## 2014-06-17 DIAGNOSIS — C61 Malignant neoplasm of prostate: Secondary | ICD-10-CM

## 2014-06-17 DIAGNOSIS — R0602 Shortness of breath: Secondary | ICD-10-CM | POA: Diagnosis present

## 2014-06-17 DIAGNOSIS — I2699 Other pulmonary embolism without acute cor pulmonale: Secondary | ICD-10-CM | POA: Diagnosis not present

## 2014-06-17 DIAGNOSIS — R0600 Dyspnea, unspecified: Secondary | ICD-10-CM

## 2014-06-17 DIAGNOSIS — J439 Emphysema, unspecified: Secondary | ICD-10-CM | POA: Diagnosis not present

## 2014-06-17 DIAGNOSIS — E278 Other specified disorders of adrenal gland: Secondary | ICD-10-CM

## 2014-06-17 DIAGNOSIS — C801 Malignant (primary) neoplasm, unspecified: Secondary | ICD-10-CM | POA: Insufficient documentation

## 2014-06-17 DIAGNOSIS — G893 Neoplasm related pain (acute) (chronic): Secondary | ICD-10-CM

## 2014-06-17 DIAGNOSIS — K7689 Other specified diseases of liver: Secondary | ICD-10-CM | POA: Insufficient documentation

## 2014-06-17 DIAGNOSIS — N281 Cyst of kidney, acquired: Secondary | ICD-10-CM | POA: Insufficient documentation

## 2014-06-17 DIAGNOSIS — C3411 Malignant neoplasm of upper lobe, right bronchus or lung: Secondary | ICD-10-CM | POA: Insufficient documentation

## 2014-06-17 DIAGNOSIS — C7951 Secondary malignant neoplasm of bone: Secondary | ICD-10-CM

## 2014-06-17 LAB — CBC WITH DIFFERENTIAL/PLATELET
BASO%: 0.3 % (ref 0.0–2.0)
BASOS ABS: 0 10*3/uL (ref 0.0–0.1)
EOS ABS: 0 10*3/uL (ref 0.0–0.5)
EOS%: 1 % (ref 0.0–7.0)
HCT: 29.3 % — ABNORMAL LOW (ref 38.4–49.9)
HGB: 9.3 g/dL — ABNORMAL LOW (ref 13.0–17.1)
LYMPH%: 23 % (ref 14.0–49.0)
MCH: 30.1 pg (ref 27.2–33.4)
MCHC: 31.7 g/dL — ABNORMAL LOW (ref 32.0–36.0)
MCV: 94.8 fL (ref 79.3–98.0)
MONO#: 0.8 10*3/uL (ref 0.1–0.9)
MONO%: 18.9 % — ABNORMAL HIGH (ref 0.0–14.0)
NEUT#: 2.3 10*3/uL (ref 1.5–6.5)
NEUT%: 56.8 % (ref 39.0–75.0)
PLATELETS: 167 10*3/uL (ref 140–400)
RBC: 3.09 10*6/uL — ABNORMAL LOW (ref 4.20–5.82)
RDW: 19.9 % — ABNORMAL HIGH (ref 11.0–14.6)
WBC: 4 10*3/uL (ref 4.0–10.3)
lymph#: 0.9 10*3/uL (ref 0.9–3.3)

## 2014-06-17 LAB — COMPREHENSIVE METABOLIC PANEL (CC13)
ALK PHOS: 111 U/L (ref 40–150)
ALT: 14 U/L (ref 0–55)
AST: 17 U/L (ref 5–34)
Albumin: 3.3 g/dL — ABNORMAL LOW (ref 3.5–5.0)
Anion Gap: 8 mEq/L (ref 3–11)
BUN: 15.4 mg/dL (ref 7.0–26.0)
CALCIUM: 9.4 mg/dL (ref 8.4–10.4)
CO2: 26 meq/L (ref 22–29)
Chloride: 109 mEq/L (ref 98–109)
Creatinine: 0.8 mg/dL (ref 0.7–1.3)
EGFR: 85 mL/min/{1.73_m2} — AB (ref >90)
Glucose: 110 mg/dl (ref 70–140)
Potassium: 4.8 mEq/L (ref 3.5–5.1)
Sodium: 143 mEq/L (ref 136–145)
Total Bilirubin: 0.76 mg/dL (ref 0.20–1.20)
Total Protein: 6.7 g/dL (ref 6.4–8.3)

## 2014-06-17 LAB — HOLD TUBE, BLOOD BANK

## 2014-06-17 MED ORDER — IOHEXOL 350 MG/ML SOLN
100.0000 mL | Freq: Once | INTRAVENOUS | Status: AC | PRN
  Administered 2014-06-17: 100 mL via INTRAVENOUS

## 2014-06-17 MED ORDER — PREDNISONE 10 MG PO TABS: ORAL_TABLET | ORAL | Status: DC

## 2014-06-17 NOTE — Telephone Encounter (Signed)
per pof to pt sch for CT angio/CS already sch

## 2014-06-17 NOTE — Telephone Encounter (Signed)
added appt per pof....pt aware °

## 2014-06-17 NOTE — Telephone Encounter (Signed)
Pt called stating that he woke up feeling SOB last night.  He states that he used his inhalers but does not feel much better.  Currently he states he is not in distress but wondered if he need blood.  Per Dr. Vista Mink, okay for pt to see Selena Lesser to be evaluated.  She is aware of pt status.

## 2014-06-18 ENCOUNTER — Ambulatory Visit
Admission: RE | Admit: 2014-06-18 | Discharge: 2014-06-18 | Disposition: A | Discharge status: Home / Self Care | Payer: Medicare Other | Source: Ambulatory Visit | Attending: Radiation Oncology | Admitting: Radiation Oncology

## 2014-06-18 ENCOUNTER — Encounter: Payer: Self-pay | Admitting: Nurse Practitioner

## 2014-06-18 ENCOUNTER — Encounter: Payer: Self-pay | Admitting: Radiation Oncology

## 2014-06-18 VITALS — BP 133/73 | HR 73 | Temp 98.2°F | Resp 20 | Ht 72.0 in | Wt 208.8 lb

## 2014-06-18 DIAGNOSIS — C3491 Malignant neoplasm of unspecified part of right bronchus or lung: Secondary | ICD-10-CM

## 2014-06-18 DIAGNOSIS — Z7952 Long term (current) use of systemic steroids: Secondary | ICD-10-CM | POA: Diagnosis not present

## 2014-06-18 DIAGNOSIS — R0602 Shortness of breath: Secondary | ICD-10-CM | POA: Diagnosis not present

## 2014-06-18 DIAGNOSIS — Z923 Personal history of irradiation: Secondary | ICD-10-CM | POA: Insufficient documentation

## 2014-06-18 DIAGNOSIS — C3490 Malignant neoplasm of unspecified part of unspecified bronchus or lung: Secondary | ICD-10-CM

## 2014-06-18 DIAGNOSIS — Z7902 Long term (current) use of antithrombotics/antiplatelets: Secondary | ICD-10-CM | POA: Insufficient documentation

## 2014-06-18 DIAGNOSIS — R52 Pain, unspecified: Secondary | ICD-10-CM | POA: Diagnosis not present

## 2014-06-18 DIAGNOSIS — Z950 Presence of cardiac pacemaker: Secondary | ICD-10-CM | POA: Insufficient documentation

## 2014-06-18 DIAGNOSIS — Z510 Encounter for antineoplastic radiation therapy: Secondary | ICD-10-CM | POA: Diagnosis not present

## 2014-06-18 DIAGNOSIS — Z791 Long term (current) use of non-steroidal anti-inflammatories (NSAID): Secondary | ICD-10-CM | POA: Diagnosis not present

## 2014-06-18 DIAGNOSIS — G893 Neoplasm related pain (acute) (chronic): Secondary | ICD-10-CM | POA: Insufficient documentation

## 2014-06-18 HISTORY — DX: Malignant neoplasm of unspecified part of unspecified bronchus or lung: C34.90

## 2014-06-18 NOTE — Progress Notes (Signed)
Please see the Nurse Progress Note in the MD Initial Consult Encounter for this patient. 

## 2014-06-18 NOTE — Assessment & Plan Note (Signed)
Patient called the cancer Center this morning reporting that he awoke at approximately 2 AM this morning with complaint of acute onset dyspnea.  He denied any specific cough or wheezing.  He denied any chest pain, chest pressure, or pain with inspiration.  He stated that using his albuterol inhaler was ineffective.  The acute dyspnea did improve throughout the night; but he continues with significant dyspnea.  O2 sats remained in the upper 90s on room air.  Patient states that O2 via nasal cannula at 2 L does make him feel better however.  CT angiogram obtained today was negative for pulmonary embolism.  CTPA did reveal severe compression of the right pulmonary artery  in the right hilum.  The right upper lobe pulmonary artery is occluded by surrounding tumor.  There is also compression of the SVC.  There were no new/acute pulmonary findings.  Patient was prescribed a prednisone taper to see if this would help.  Patient also has plans to have an initial radiation oncology consult tomorrow afternoon 06/18/2014.  Have called radiation oncology and spoke to Dr. Clabe Seal  nurse; and will also forward the CT angiogram results to Dr. Sondra Come as well so he will have them available for patient's visit tomorrow afternoon.  Patient was meeting with radiation oncology in hopes of radiation to noted bone metastases; but it would also appreciate further evaluation regarding tumor burden in chest which may very well be causing the new onset dyspnea.

## 2014-06-18 NOTE — Progress Notes (Signed)
Radiation Oncology         [phone]) [phone] ________________________________  Name: Nicholas Schmitt MRN: [id number]  Date: 06/18/2014  DOB: June 03, 1936  Re-evaluation Note  CC: Nicholas Schmitt Nicholas Resides, MD  Nicholas Bears, MD   Diagnosis:  Stage IV non-small cell lung cancer  Narrative: Nicholas Schmitt is a very pleasant 79 year old gentleman who is seen out courtesy of Nicholas Schmitt for consideration for palliative radiation therapy as part of management of his progressive non-small cell lung cancer. The patient was initially seen in consultation 01/30/2014 as part of the multidisciplinary thoracic clinic. At that time no immediate recommendations were made for  radiation therapy in light of his stage IV disease. The patient proceeded to undergo systemic chemotherapy with carboplatinum and Alimta. More recently the patient has developed pain in the lower back and right upper pelvis area. A CT scan of the abdomen and pelvis revealed lesions in the L5 and upper sacrum area. More recently the patient developed acute pain in the chest region and shortness of breath which awaken him earlier this week. He proceeded to undergo an urgent chest CT scan. No evidence of pulmonary embolus was noted but the patient was noted to have significant compression of the right pulmonary artery and superior vena cava. In light of these findings the patient is now seen in radiation oncology for consideration for palliative treatments directed at the right upper chest and lower lumbar spine/upper sacrum area.                            ALLERGIES:  has No Known Allergies.  Meds: Current Outpatient Prescriptions  Medication Sig Dispense Refill  . albuterol (PROVENTIL,VENTOLIN) 90 MCG/ACT inhaler Inhale 2 puffs into the lungs every 6 (six) hours as needed for wheezing or shortness of breath.    . budesonide-formoterol (SYMBICORT) 160-4.5 MCG/ACT inhaler 1 puff twice daily 4 Inhaler 3  . clopidogrel (PLAVIX) 75 MG tablet Take 1  tablet (75 mg total) by mouth daily. 027 tablet 3  . folic acid (FOLVITE) 1 MG tablet Take 1 tablet (1 mg total) by mouth daily. 30 tablet 3  . losartan (COZAAR) 50 MG tablet Take 1 tablet (50 mg total) by mouth daily. (Patient taking differently: Half tab daily) 90 tablet 3  . Multiple Minerals (CALCIUM/MAGNESIUM/ZINC PO) Take 1 capsule by mouth every evening.    . Multiple Vitamin (MULTIVITAMIN) tablet Take 1 tablet by mouth daily.     . naproxen sodium (ANAPROX) 220 MG tablet Take 440 mg by mouth 2 (two) times daily as needed (pain).     . nitroGLYCERIN (NITROSTAT) 0.4 MG SL tablet Place 0.4 mg under the tongue every 5 (five) minutes as needed for chest pain (MAX 3 TABLETS).    . predniSONE (DELTASONE) 10 MG tablet Take 60 mg (6 tabs) PO QD x 2 days; then 40 mg QD x 2 days; then 20 mg QD x 2 days; then 10 mg QD x 2 days; then D/C. 26 tablet 0  . dexamethasone (DECADRON) 4 MG tablet 4 mg by mouth twice a day the day before, day of and day after the chemotherapy every 3 weeks. (Patient not taking: Reported on 06/17/2014) 40 tablet 1  . oxyCODONE (OXY IR/ROXICODONE) 5 MG immediate release tablet Take 1-2 tablets (5-10 mg total) by mouth every 4 (four) hours as needed for moderate pain. (Patient not taking: Reported on 06/17/2014) 30 tablet 0  . prochlorperazine (COMPAZINE) 10 MG tablet Take 1  tablet (10 mg total) by mouth every 6 (six) hours as needed for nausea or vomiting. (Patient not taking: Reported on 06/17/2014) 30 tablet 0   No current facility-administered medications for this encounter.    Physical Findings: The patient is in no acute distress. Patient is alert and oriented.  height is 6' (1.829 m) and weight is 208 lb 12.8 oz (94.711 kg). His oral temperature is 98.2 F (36.8 C). His blood pressure is 133/73 and his pulse is 73. His respiration is 20 and oxygen saturation is 98%. .   The patient has a palpable lymph node in the right supraclavicular fossa which is quite firm and estimated to  be approximately 1.5 x 2 cm in size. No palpable axillary adenopathy. The lungs are clear with breath sounds in general distant. The heart has a regular rhythm and rate. On lying flat the patient denies any worsening of his breathing. He has no appreciable swelling in the neck and face this time. Prominent veins however are noted over the sternum area. Patient has a pacemaker in place in the left upper chest.  Lab Findings: Lab Results  Component Value Date   WBC 4.0 06/17/2014   HGB 9.3* 06/17/2014   HCT 29.3* 06/17/2014   MCV 94.8 06/17/2014   PLT 167 06/17/2014    Radiographic Findings: Dg Chest 2 View  06/02/2014   CLINICAL DATA:  Shortness of breath.  EXAM: CHEST  2 VIEW  COMPARISON:  CT 03/24/2013. Chest x-ray 01/24/2014, 02/27/2013, 07/22/2010.  FINDINGS: Cardiac pacer noted with lead tips in right atrium and right ventricle. Heart size normal. Right upper lobe and bibasilar pleural parenchymal thickening noted consistent with scarring. Mild superimposed infiltrate right lung base cannot be excluded. No pleural effusion or pneumothorax. Stable cardiomegaly normal pulmonary vascularity a cardiac pacer lead tips in right atrium and right ventricle. No acute bony abnormality.  IMPRESSION: 1. Right upper lobe and bibasilar pleural parenchymal thickening consistent with scarring. Mild superimposed acute infiltrate in the right lung base cannot be excluded. 2. Stable cardiomegaly, no CHF.  Cardiac pacer in stable position.   Electronically Signed   By: Marcello Moores  Register   On: 06/02/2014 12:04   Dg Hip Complete Right  05/26/2014   CLINICAL DATA:  Lung cancer.  Prostate cancer.  Right hip pain.  EXAM: RIGHT HIP - COMPLETE 2+ VIEW  COMPARISON:  03/24/2014  FINDINGS: No acute fracture. No dislocation. Joint spaces are maintained. Prostate cancer brachy therapy pellets in place. Vascular calcifications. No destructive bone lesion.  IMPRESSION: No acute bony pathology.   Electronically Signed   By: Maryclare Bean M.D.   On: 05/26/2014 13:53   Ct Chest W Contrast  06/04/2014   CLINICAL DATA:  Stage 4 non-small-cell cancer post radiation therapy, ongoing chemotherapy, restaging; history prostate cancer 2004, COPD with shortness of breath, coronary artery disease post MI and coronary stenting, hypertension  EXAM: CT CHEST, ABDOMEN, AND PELVIS WITH CONTRAST  TECHNIQUE: Multidetector CT imaging of the chest, abdomen and pelvis was performed following the standard protocol during bolus administration of intravenous contrast. Sagittal and coronal MPR images reconstructed from axial data set.  CONTRAST:  171mL OMNIPAQUE IOHEXOL 300 MG/ML  SOLN  COMPARISON:  CT chest abdomen and pelvis 03/24/2014  FINDINGS: CT CHEST FINDINGS  Thoracic vascular structures patent on nondedicated exam.  Scattered atherosclerotic calcifications aorta and throughout coronary system.  Mediastinal adenopathy again identified;  RIGHT prevascular node 20 mm short axis image 19 unchanged;  Lower RIGHT paratracheal node 21  mm image 20 unchanged;  Node anterior to RIGHT mainstem bronchus 18 mm image 238 unchanged;  15 mm short axis RIGHT hilar node image 26 previously 11 mm.  No new thoracic adenopathy.  Posterior LEFT diaphragmatic defect, Bochdalek type with herniation of fat.  Pacemaker leads RIGHT atrium and RIGHT ventricle.  New anterior RIGHT upper lobe spiculated nodule compatible with neoplasm, 12 x 9 mm image 13 with associated pleural thickening.  Spiculated poorly defined posterior RIGHT upper lobe nodular density 15 x 9 mm image 12 previously 15 x 10 mm.  6 mm RIGHT apex nodule image 9 unchanged.  3 mm LEFT lower lobe nodule image 32 unchanged.  Scattered peribronchial thickening and interstitial prominence unchanged.  Paramediastinal atelectasis or radiation change in medial RIGHT middle lobe.  No additional mass, nodule, infiltrate, pleural effusion, or pneumothorax.  No definite osseous metastases.  CT ABDOMEN AND PELVIS FINDINGS   Low-attenuation hepatic lesions likely cysts unchanged largest 20 x 14 mm LEFT lobe image 44.  BILATERAL renal cysts, largest 3.6 x 3.5 cm upper pole LEFT kidney image 56 unchanged.  Nodular enlargement of RIGHT adrenal gland 3.0 x 1.3 cm increased in size since previous study when this measured approximately 2.1 x 1.4 cm  Liver, spleen, pancreas, kidneys, and LEFT adrenal gland otherwise normal.  Normal appendix.  Stomach and bowel loops normal appearance.  Unremarkable bladder and ureters.  Brachytherapy seed implants in prostate bed.  LEFT inguinal hernia containing fat  No mass, adenopathy, free fluid or inflammatory process.  Aneurysmal dilatation distal abdominal aorta 3.4 x 3.3 cm image 85 unchanged.  Subtle area of osseous lucency LEFT lateral aspect of L2 vertebral body.  Probable lytic metastasis RIGHT lateral aspect of L5 vertebral body 20 x 14 mm image 92.  Probable lytic osseous metastasis RIGHT sacrum 27 x 21 mm image 97, slightly increased.  Degenerative disc disease changes lumbar spine.  IMPRESSION: Stable spiculated density posterior RIGHT upper lobe 15 x 9 mm.  New 12 x 9 mm spiculated anterior LEFT upper lobe density with associated pleural thickening compatible with neoplasm, primary versus metastatic.  Stable BILATERAL tiny lung nodules.  Increased size of RIGHT adrenal mass.  Stable hepatic and renal cysts.  LEFT inguinal hernia.  Stable abdominal aortic aneurysm 3.4 x 3.3 cm.  Probable osseous metastasis RIGHT L5 vertebra and RIGHT sacrum, both slightly larger than on previous exam.   Electronically Signed   By: Lavonia Dana M.D.   On: 06/04/2014 09:34   Ct Angio Chest Pe W/cm &/or Wo Cm  06/17/2014   CLINICAL DATA:  Acute onset of shortness of breath and back pain. New diagnosis of lung cancer.  EXAM: CT ANGIOGRAPHY CHEST WITH CONTRAST  TECHNIQUE: Multidetector CT imaging of the chest was performed using the standard protocol during bolus administration of intravenous contrast. Multiplanar  CT image reconstructions and MIPs were obtained to evaluate the vascular anatomy.  CONTRAST:  170mL OMNIPAQUE IOHEXOL 350 MG/ML SOLN  COMPARISON:  06/04/2014.  FINDINGS: Chest wall: Stable borderline enlarged right axillary lymph nodes. No supraclavicular adenopathy. The thyroid gland is grossly normal. The bony thorax is intact. No destructive bone lesions or spinal canal compromise. Stable degenerative changes.  Mediastinum: The heart is normal in size. No pericardial effusion. Stable advanced atherosclerotic calcifications involving the aorta and branch vessels including the coronary arteries. Stable mediastinal and right hilar lymphadenopathy with mild compression of the SVC. The esophagus is grossly normal.  The pulmonary arterial tree is well opacified. No filling defects to  suggest pulmonary embolism. There is significant compression of the right pulmonary artery in the right hilum and the upper lobe pulmonary artery is occluded. This is chronic.  Lungs: Stable spiculated right upper lobe lung lesions and stable emphysematous changes and pulmonary scarring. No pleural effusion.  Upper abdomen: Stable hepatic cysts. Stable right adrenal metastasis. Stable upper pole left renal cysts.  Review of the MIP images confirms the above findings.  IMPRESSION: 1. No CT findings for pulmonary embolism. 2. Severe compression of the right pulmonary artery in the right hilum. The right upper lobe pulmonary artery is occluded by surrounding tumor. There is also compression of the SVC. 3. Stable spiculated lung lesions in the right upper lobe along with right hilar and mediastinal lymphadenopathy. 4. No acute pulmonary findings. Chronic emphysematous changes and pulmonary scarring with scattered small pulmonary nodules. 5. Stable right adrenal gland metastasis.   Electronically Signed   By: Kalman Jewels M.D.   On: 06/17/2014 15:44   Ct Abdomen Pelvis W Contrast  06/04/2014   CLINICAL DATA:  Stage 4 non-small-cell  cancer post radiation therapy, ongoing chemotherapy, restaging; history prostate cancer 2004, COPD with shortness of breath, coronary artery disease post MI and coronary stenting, hypertension  EXAM: CT CHEST, ABDOMEN, AND PELVIS WITH CONTRAST  TECHNIQUE: Multidetector CT imaging of the chest, abdomen and pelvis was performed following the standard protocol during bolus administration of intravenous contrast. Sagittal and coronal MPR images reconstructed from axial data set.  CONTRAST:  139mL OMNIPAQUE IOHEXOL 300 MG/ML  SOLN  COMPARISON:  CT chest abdomen and pelvis 03/24/2014  FINDINGS: CT CHEST FINDINGS  Thoracic vascular structures patent on nondedicated exam.  Scattered atherosclerotic calcifications aorta and throughout coronary system.  Mediastinal adenopathy again identified;  RIGHT prevascular node 20 mm short axis image 19 unchanged;  Lower RIGHT paratracheal node 21 mm image 20 unchanged;  Node anterior to RIGHT mainstem bronchus 18 mm image 238 unchanged;  15 mm short axis RIGHT hilar node image 26 previously 11 mm.  No new thoracic adenopathy.  Posterior LEFT diaphragmatic defect, Bochdalek type with herniation of fat.  Pacemaker leads RIGHT atrium and RIGHT ventricle.  New anterior RIGHT upper lobe spiculated nodule compatible with neoplasm, 12 x 9 mm image 13 with associated pleural thickening.  Spiculated poorly defined posterior RIGHT upper lobe nodular density 15 x 9 mm image 12 previously 15 x 10 mm.  6 mm RIGHT apex nodule image 9 unchanged.  3 mm LEFT lower lobe nodule image 32 unchanged.  Scattered peribronchial thickening and interstitial prominence unchanged.  Paramediastinal atelectasis or radiation change in medial RIGHT middle lobe.  No additional mass, nodule, infiltrate, pleural effusion, or pneumothorax.  No definite osseous metastases.  CT ABDOMEN AND PELVIS FINDINGS  Low-attenuation hepatic lesions likely cysts unchanged largest 20 x 14 mm LEFT lobe image 44.  BILATERAL renal cysts,  largest 3.6 x 3.5 cm upper pole LEFT kidney image 56 unchanged.  Nodular enlargement of RIGHT adrenal gland 3.0 x 1.3 cm increased in size since previous study when this measured approximately 2.1 x 1.4 cm  Liver, spleen, pancreas, kidneys, and LEFT adrenal gland otherwise normal.  Normal appendix.  Stomach and bowel loops normal appearance.  Unremarkable bladder and ureters.  Brachytherapy seed implants in prostate bed.  LEFT inguinal hernia containing fat  No mass, adenopathy, free fluid or inflammatory process.  Aneurysmal dilatation distal abdominal aorta 3.4 x 3.3 cm image 85 unchanged.  Subtle area of osseous lucency LEFT lateral aspect of L2 vertebral body.  Probable lytic metastasis RIGHT lateral aspect of L5 vertebral body 20 x 14 mm image 92.  Probable lytic osseous metastasis RIGHT sacrum 27 x 21 mm image 97, slightly increased.  Degenerative disc disease changes lumbar spine.  IMPRESSION: Stable spiculated density posterior RIGHT upper lobe 15 x 9 mm.  New 12 x 9 mm spiculated anterior LEFT upper lobe density with associated pleural thickening compatible with neoplasm, primary versus metastatic.  Stable BILATERAL tiny lung nodules.  Increased size of RIGHT adrenal mass.  Stable hepatic and renal cysts.  LEFT inguinal hernia.  Stable abdominal aortic aneurysm 3.4 x 3.3 cm.  Probable osseous metastasis RIGHT L5 vertebra and RIGHT sacrum, both slightly larger than on previous exam.   Electronically Signed   By: Lavonia Dana M.D.   On: 06/04/2014 09:34    Impression:  The patient would be a good candidate for palliative radiation therapy directed at the lower lumbar spine and upper sacrum area. He would also be a good candidate for radiation therapy directed at the right upper chest in light of his chest CT findings and impending superior vena cava syndrome. I discussed the treatment course side effects and potential toxicities of radiation therapy in this situation with the patient and his wife. He  appears to understand and wishes to proceed with planned course of treatment.  Plan:  Simulation and planning later today with treatments to begin January 11. I anticipate 15 treatments directed at the right upper chest region and 10 treatments directed at the lumbar spine area. Patient knows to call if he develops acute breathing problems or swelling of the head and neck requiring emergent treatment. I Should mention patient was placed on steroids which have helped his symptoms significantly.  ____________________________________ Blair Promise, MD

## 2014-06-18 NOTE — Assessment & Plan Note (Signed)
Patient has been diagnosed with bone metastasis.  Patient continues to complain of some moderate pain to both his right sacral region, his low back, and his bilateral flank areas.  He states he has oxycodone to home to take; but it makes him feel sleepy.  Patient is scheduled to meet with radiation oncology this coming Wednesday, 06/18/2014 for evaluation of radiation to bone metastasis sites for pain control.

## 2014-06-18 NOTE — Progress Notes (Signed)
  Radiation Oncology         [phone]) [phone] ________________________________  Name: CHIN WACHTER MRN: [id number]  Date: 06/18/2014  DOB: Nov 25, 1935  SIMULATION AND TREATMENT PLANNING NOTE    ICD-9-CM ICD-10-CM   1. Non-small cell carcinoma of lung, stage 4, right 162.9 C34.91     DIAGNOSIS:  Stage IV non-small cell lung cancer  NARRATIVE:  The patient was brought to the [facility].  Identity was confirmed.  All relevant records and images related to the planned course of therapy were reviewed.  The patient freely provided informed written consent to proceed with treatment after reviewing the details related to the planned course of therapy. The consent form was witnessed and verified by the simulation staff.  Then, the patient was set-up in a stable reproducible  supine position for radiation therapy.  CT images were obtained.  Surface markings were placed.  The CT images were loaded into the planning software.  Then the target and avoidance structures were contoured.  Treatment planning then occurred.  The radiation prescription was entered and confirmed.  Then, I designed and supervised the construction of a total of 8 medically necessary complex treatment devices.  I have requested : 3D Simulation  I have requested a DVH of the following structures: CTV, spinal cord, lungs.  I have ordered:dose calc.  PLAN:  The patient will receive 35 Gy in 14 fractions directed at the right upper chest region and lower lumbar/ upper sacrum area.  ________________________________ -----------------------------------  Blair Promise, PhD, MD

## 2014-06-18 NOTE — Progress Notes (Signed)
will   SYMPTOM MANAGEMENT CLINIC   HPI: Nicholas Schmitt 79 y.o. male diagnosed with lung cancer; with bone metastasis.  Patient is status post carboplatin/Alimta chemotherapy regimen.  Patient called the cancer Center this morning requesting urgent care visit.  Patient completed his sixth cycle of carboplatin/Alimta chemotherapy on 05/20/2014.  Patient reports that he awoke this morning at 2 AM with acute onset shortness of breath.  He denies any specific chest pain, chest pressure, pain with inspiration, or wheezing.  Patient states that he used his albuterol inhaler at that time; but it was fairly ineffective.  He states that his shortness of breath did eventually improve; but he remains more dyspneic than in the past.  Patient is also complaining of pain to his right sacral region, his low back, and his bilateral posterior flanks.  He has been diagnosed with bone metastasis recently.  He has plans to meet with radiation oncology tomorrow 06/18/2014 for discussion of possible radiation to the sites.  Patient states he has been taking oxycodone only occasionally; since it does make him sleepy.  Patient denies any recent fevers or chills.   Shortness of Breath    CURRENT THERAPY: No active treatment plan for patient.   Review of Systems  Respiratory: Positive for shortness of breath.     Past Medical History  Diagnosis Date  . Hypertension   . CAD (coronary artery disease)     positive stress test in 2006 led to left heart cath (8/06) showing 95% prox RCA, 90% CFX, and 90% mLAD.  patient had a cypher DES to all 3 lesions  . Hyperlipidemia   . Hearing loss   . AAA (abdominal aortic aneurysm) 11/09    3.2 cm   . Chronic renal insufficiency   . History of radiation therapy 03/08/04- 04/08/04    prostate 4600 cGy in 3 fractions, radioactive seed implant 05/04/04  . Myocardial infarction 1995  . Pacemaker 2014  . Dysrhythmia   . Shortness of breath     exertion  . Asthma    bronchial  . COPD (chronic obstructive pulmonary disease)   . Cancer 2015    prostate   . Prostate cancer 2005    gleason 7  . Skin cancer     ear    Past Surgical History  Procedure Laterality Date  . Ptca  2006  . Coronary stent placement    . Permanent pacemaker insertion Bilateral 02/26/13    MDT Adapta L implanted by Dr Rayann Heman for sick sinus syndrome  . Radioactive seed implant  05/04/2004    8000 cGy, Dr Danny Lawless Dr Reece Agar  . Elbow surgery Bilateral 1973  . Insert / replace / remove pacemaker    . Coronary angioplasty    . Mediastinoscopy N/A 01/28/2014    Procedure: MEDIASTINOSCOPY;  Surgeon: Melrose Nakayama, MD;  Location: [facility];  Service: Thoracic;  Laterality: N/A;  . 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  has No Known Allergies.    Medication List       This list is accurate as of: 06/17/14 11:59 PM.  Always use your most recent med list.               albuterol 90 MCG/ACT inhaler  Commonly known as:  PROVENTIL,VENTOLIN  Inhale 2 puffs into the lungs every 6 (six) hours as needed for wheezing or shortness of breath.     budesonide-formoterol 160-4.5 MCG/ACT inhaler  Commonly known as:  SYMBICORT  1 puff twice daily     CALCIUM/MAGNESIUM/ZINC PO  Take 1 capsule by mouth every evening.     clopidogrel  75 MG tablet  Commonly known as:  PLAVIX  Take 1 tablet (75 mg total) by mouth daily.     dexamethasone 4 MG tablet  Commonly known as:  DECADRON  4 mg by mouth twice a day the day before, day of and day after the chemotherapy every 3 weeks.     folic acid 1 MG tablet  Commonly known as:  FOLVITE  Take 1 tablet (1 mg total) by mouth daily.     losartan 50 MG tablet  Commonly known as:  COZAAR  Take 1 tablet (50 mg total) by mouth daily.     multivitamin tablet  Take 1 tablet by mouth daily.     naproxen sodium 220 MG tablet  Commonly known as:  ANAPROX  Take 440 mg by mouth 2 (two) times daily as needed (pain).     nitroGLYCERIN 0.4 MG SL tablet  Commonly known as:  NITROSTAT  Place 0.4 mg under the tongue every 5 (five) minutes as needed for chest pain (MAX 3 TABLETS).     oxyCODONE 5 MG immediate release tablet  Commonly known as:  Oxy IR/ROXICODONE  Take 1-2 tablets (5-10 mg total) by mouth every 4 (four) hours as needed for moderate pain.     predniSONE 10 MG tablet  Commonly known as:  DELTASONE  Take 60 mg (6 tabs) PO QD x 2 days; then 40 mg QD x 2 days; then 20 mg QD x 2 days; then 10 mg QD x 2 days; then D/C.     prochlorperazine 10 MG tablet  Commonly known as:  COMPAZINE  Take 1 tablet (10 mg total) by mouth every 6 (six) hours as needed for nausea or vomiting.         PHYSICAL EXAMINATION  Blood pressure 134/81, pulse 82, temperature 98 F (36.7 C), temperature source Oral, resp. rate 18, height 6' (1.829 m), weight 208 lb 9.6 oz (94.62 kg), SpO2 100 %.  Physical Exam  Constitutional: He is oriented to person, place, and time. Vital signs are normal. He appears unhealthy.  HENT:  Head: Normocephalic and atraumatic.  Mouth/Throat: Oropharynx is clear and moist.  Eyes: Conjunctivae and EOM are normal. Pupils are equal, round, and reactive to light. Right eye exhibits no discharge. Left eye exhibits no discharge. No scleral icterus.  Neck: Normal range of  motion. Neck supple. No JVD present. No tracheal deviation present. No thyromegaly present.  Cardiovascular: Normal rate, regular rhythm, normal heart sounds and intact distal pulses.   Pulmonary/Chest: Breath sounds normal. No stridor. No respiratory distress. He has no wheezes. He has no rales. He exhibits no tenderness.  Patient noted to have some mild dyspnea at rest.  Patient did appear to be breathing somewhat better when O2 applied at 2 L nasal cannula.  Abdominal: Soft. Bowel sounds are normal.  He exhibits no distension and no mass. There is no tenderness. There is no rebound and no guarding.  Musculoskeletal: Normal range of motion. He exhibits no edema or tenderness.  Lymphadenopathy:    He has no cervical adenopathy.  Neurological: He is alert and oriented to person, place, and time.  Patient does appear slightly frail and weak today.  Was able to ambulate on his own; but was placed in a wheelchair for further ambulation assistance.  Skin: Skin is warm and dry. No rash noted. No erythema.  Psychiatric: Affect normal.    LABORATORY DATA:. Appointment on 06/17/2014  Component Date Value Ref Range Status  . WBC 06/17/2014 4.0  4.0 - 10.3 10e3/uL Final  . NEUT# 06/17/2014 2.3  1.5 - 6.5 10e3/uL Final  . HGB 06/17/2014 9.3* 13.0 - 17.1 g/dL Final  . HCT 06/17/2014 29.3* 38.4 - 49.9 % Final  . Platelets 06/17/2014 167  140 - 400 10e3/uL Final  . MCV 06/17/2014 94.8  79.3 - 98.0 fL Final  . MCH 06/17/2014 30.1  27.2 - 33.4 pg Final  . MCHC 06/17/2014 31.7* 32.0 - 36.0 g/dL Final  . RBC 06/17/2014 3.09* 4.20 - 5.82 10e6/uL Final  . RDW 06/17/2014 19.9* 11.0 - 14.6 % Final  . lymph# 06/17/2014 0.9  0.9 - 3.3 10e3/uL Final  . MONO# 06/17/2014 0.8  0.1 - 0.9 10e3/uL Final  . Eosinophils Absolute 06/17/2014 0.0  0.0 - 0.5 10e3/uL Final  . Basophils Absolute 06/17/2014 0.0  0.0 - 0.1 10e3/uL Final  . NEUT% 06/17/2014 56.8  39.0 - 75.0 % Final  . LYMPH% 06/17/2014 23.0  14.0 - 49.0 %  Final  . MONO% 06/17/2014 18.9* 0.0 - 14.0 % Final  . EOS% 06/17/2014 1.0  0.0 - 7.0 % Final  . BASO% 06/17/2014 0.3  0.0 - 2.0 % Final  . Hold Tube, Blood Bank 06/17/2014 Blood Bank Order Cancelled   Final  . Sodium 06/17/2014 143  136 - 145 mEq/L Final  . Potassium 06/17/2014 4.8  3.5 - 5.1 mEq/L Final  . Chloride 06/17/2014 109  98 - 109 mEq/L Final  . CO2 06/17/2014 26  22 - 29 mEq/L Final  . Glucose 06/17/2014 110  70 - 140 mg/dl Final  . BUN 06/17/2014 15.4  7.0 - 26.0 mg/dL Final  . Creatinine 06/17/2014 0.8  0.7 - 1.3 mg/dL Final  . Total Bilirubin 06/17/2014 0.76  0.20 - 1.20 mg/dL Final  . Alkaline Phosphatase 06/17/2014 111  40 - 150 U/L Final  . AST 06/17/2014 17  5 - 34 U/L Final  . ALT 06/17/2014 14  0 - 55 U/L Final  . Total Protein 06/17/2014 6.7  6.4 - 8.3 g/dL Final  . Albumin 06/17/2014 3.3* 3.5 - 5.0 g/dL Final  . Calcium 06/17/2014 9.4  8.4 - 10.4 mg/dL Final  . Anion Gap 06/17/2014 8  3 - 11 mEq/L Final  . EGFR 06/17/2014 85* >90 ml/min/1.73 m2 Final   eGFR is calculated using the CKD-EPI Creatinine Equation (2009)     RADIOGRAPHIC STUDIES: Ct Angio Chest Pe W/cm &/or Wo Cm  06/17/2014   CLINICAL DATA:  Acute onset of shortness of breath and back pain. New diagnosis of lung cancer.  EXAM: CT ANGIOGRAPHY CHEST WITH CONTRAST  TECHNIQUE: Multidetector CT imaging of the chest was performed using the standard protocol during bolus administration of intravenous contrast. Multiplanar CT image reconstructions and MIPs were obtained to evaluate the vascular anatomy.  CONTRAST:  136m OMNIPAQUE IOHEXOL 350  MG/ML SOLN  COMPARISON:  06/04/2014.  FINDINGS: Chest wall: Stable borderline enlarged right axillary lymph nodes. No supraclavicular adenopathy. The thyroid gland is grossly normal. The bony thorax is intact. No destructive bone lesions or spinal canal compromise. Stable degenerative changes.  Mediastinum: The heart is normal in size. No pericardial effusion. Stable advanced  atherosclerotic calcifications involving the aorta and branch vessels including the coronary arteries. Stable mediastinal and right hilar lymphadenopathy with mild compression of the SVC. The esophagus is grossly normal.  The pulmonary arterial tree is well opacified. No filling defects to suggest pulmonary embolism. There is significant compression of the right pulmonary artery in the right hilum and the upper lobe pulmonary artery is occluded. This is chronic.  Lungs: Stable spiculated right upper lobe lung lesions and stable emphysematous changes and pulmonary scarring. No pleural effusion.  Upper abdomen: Stable hepatic cysts. Stable right adrenal metastasis. Stable upper pole left renal cysts.  Review of the MIP images confirms the above findings.  IMPRESSION: 1. No CT findings for pulmonary embolism. 2. Severe compression of the right pulmonary artery in the right hilum. The right upper lobe pulmonary artery is occluded by surrounding tumor. There is also compression of the SVC. 3. Stable spiculated lung lesions in the right upper lobe along with right hilar and mediastinal lymphadenopathy. 4. No acute pulmonary findings. Chronic emphysematous changes and pulmonary scarring with scattered small pulmonary nodules. 5. Stable right adrenal gland metastasis.   Electronically Signed   By: Kalman Jewels M.D.   On: 06/17/2014 15:44    ASSESSMENT/PLAN:    Dyspnea Patient called the cancer Center this morning reporting that he awoke at approximately 2 AM this morning with complaint of acute onset dyspnea.  He denied any specific cough or wheezing.  He denied any chest pain, chest pressure, or pain with inspiration.  He stated that using his albuterol inhaler was ineffective.  The acute dyspnea did improve throughout the night; but he continues with significant dyspnea.  O2 sats remained in the upper 90s on room air.  Patient states that O2 via nasal cannula at 2 L does make him feel better however.  CT  angiogram obtained today was negative for pulmonary embolism.  CTPA did reveal severe compression of the right pulmonary artery  in the right hilum.  The right upper lobe pulmonary artery is occluded by surrounding tumor.  There is also compression of the SVC.  There were no new/acute pulmonary findings.  Patient was prescribed a prednisone taper to see if this would help.  Patient also has plans to have an initial radiation oncology consult tomorrow afternoon 06/18/2014.  Have called radiation oncology and spoke to Dr. Clabe Seal  nurse; and will also forward the CT angiogram results to Dr. Sondra Come as well so he will have them available for patient's visit tomorrow afternoon.  Patient was meeting with radiation oncology in hopes of radiation to noted bone metastases; but it would also appreciate further evaluation regarding tumor burden in chest which may very well be causing the new onset dyspnea.     Neoplasm related pain Patient has been diagnosed with bone metastasis.  Patient continues to complain of some moderate pain to both his right sacral region, his low back, and his bilateral flank areas.  He states he has oxycodone to home to take; but it makes him feel sleepy.  Patient is scheduled to meet with radiation oncology this coming Wednesday, 06/18/2014 for evaluation of radiation to bone metastasis sites for pain control.  Non-small cell carcinoma of lung, stage 4 Patient is status post 6 cycles of carboplatin/Alimta chemotherapy regimen.  Restaging CT obtained on 06/04/2014 revealed progression of disease.  Patient has plans to meet with radiation oncology tomorrow 06/18/2014 for evaluation of possible radiation to bone metastasis sites for pain control.  Patient also has plans to return to the [facility] for labs and a follow-up visit on 07/02/2014.  Will discuss treatment options of either Nivolumab or pembrolizumab immunotherapy at that time.   Patient stated understanding of all  instructions; and was in agreement with this plan of care. The patient knows to call the clinic with any problems, questions or concerns.   This was a shared visit with Dr. Julien Nordmann today.  Total time spent with patient was 40 minutes;  with greater than 75 percent of that time spent in face to face counseling regarding his symptoms, and coordination of care and follow up.  Disclaimer: This note was dictated with voice recognition software. Similar sounding words can inadvertently be transcribed and may not be corrected upon review.   Drue Second, NP 06/18/2014   ADDENDUM: Hematology/Oncology Attending: I had a face to face encounter with the patient. I recommended his care plan. This is a very pleasant 79 years old white male with metastatic non-small cell lung cancer completed 6 cycles of systemic chemotherapy was carboplatin and Alimta. He was recently found to have evidence for disease progression especially in the bone. The patient presented with acute shortness of breath and repeat CT angiogram of the chest today showed no evidence for pulmonary embolism but there was tumor surrounding and occluding the right upper lobe pulmonary artery with compression of the SVC. He was started on prednisone and referred to Dr. Dorathy Daft for consideration of palliative radiotherapy to that area in addition to the metastatic bone lesions. The patient would come back for follow-up visit in 2 weeks for reevaluation and discussion of further treatment options after completion of the radiotherapy. This would include consideration of treatment with Nivolumab or Pembrolizumab. He was advised to call immediately if he has any concerning symptoms in the interval.  Disclaimer: This note was dictated with voice recognition software. Similar sounding words can inadvertently be transcribed and may be missed upon review. Eilleen Kempf., MD 06/25/2014

## 2014-06-18 NOTE — Assessment & Plan Note (Signed)
Patient is status post 6 cycles of carboplatin/Alimta chemotherapy regimen.  Restaging CT obtained on 06/04/2014 revealed progression of disease.  Patient has plans to meet with radiation oncology tomorrow 06/18/2014 for evaluation of possible radiation to bone metastasis sites for pain control.  Patient also has plans to return to the [facility] for labs and a follow-up visit on 07/02/2014.  Will discuss treatment options of either Nivolumab or pembrolizumab immunotherapy at that time.

## 2014-06-19 ENCOUNTER — Other Ambulatory Visit: Payer: Self-pay | Admitting: Radiation Oncology

## 2014-06-19 DIAGNOSIS — Z510 Encounter for antineoplastic radiation therapy: Secondary | ICD-10-CM | POA: Diagnosis not present

## 2014-06-20 ENCOUNTER — Encounter (HOSPITAL_COMMUNITY): Payer: Self-pay

## 2014-06-20 ENCOUNTER — Telehealth: Payer: Self-pay | Admitting: Oncology

## 2014-06-20 NOTE — Telephone Encounter (Signed)
Called the device clinic at [facility] regarding the pacemaker form for Nicholas Schmitt.  Per Pamala Hurry, they have received the form and are waiting for Dr. Joylene Grapes to complete it.  They will fax it this afternoon.

## 2014-06-21 NOTE — Progress Notes (Signed)
   Department of Radiation Oncology  Phone:  [phone] Fax:        [phone]  Special treatment procedure note  The patient has received radiation therapy to the pelvis region for management of prostate cancer in the past. Additional time was taking in reviewing the patient's previous radiation treatment as it relates to his current set up and treatment directed at the lower lumbar spine and sacrum area. Given this the additional time in reviewing the patient's treatments this constitutes a special treatment procedure.  -----------------------------------  Blair Promise, PhD, MD

## 2014-06-21 NOTE — Addendum Note (Signed)
Encounter addended by: Blair Promise, MD on: 06/21/2014 12:26 PM<BR>     Documentation filed: Notes Section

## 2014-06-23 ENCOUNTER — Ambulatory Visit
Admission: RE | Admit: 2014-06-23 | Discharge: 2014-06-23 | Disposition: A | Discharge status: Home / Self Care | Payer: Medicare Other | Source: Ambulatory Visit | Attending: Radiation Oncology | Admitting: Radiation Oncology

## 2014-06-23 ENCOUNTER — Encounter: Payer: Self-pay | Admitting: *Deleted

## 2014-06-23 DIAGNOSIS — Z510 Encounter for antineoplastic radiation therapy: Secondary | ICD-10-CM | POA: Diagnosis not present

## 2014-06-23 NOTE — Progress Notes (Signed)
LVM for return call to follow up on pain and shortness of breath.

## 2014-06-24 ENCOUNTER — Encounter: Payer: Self-pay | Admitting: Radiation Oncology

## 2014-06-24 ENCOUNTER — Ambulatory Visit
Admission: RE | Admit: 2014-06-24 | Discharge: 2014-06-24 | Disposition: A | Discharge status: Home / Self Care | Payer: Medicare Other | Source: Ambulatory Visit | Attending: Radiation Oncology | Admitting: Radiation Oncology

## 2014-06-24 ENCOUNTER — Encounter: Payer: Self-pay | Admitting: *Deleted

## 2014-06-24 VITALS — BP 106/69 | HR 77 | Temp 97.9°F | Resp 20 | Ht 72.0 in | Wt 204.0 lb

## 2014-06-24 DIAGNOSIS — C3491 Malignant neoplasm of unspecified part of right bronchus or lung: Secondary | ICD-10-CM | POA: Insufficient documentation

## 2014-06-24 DIAGNOSIS — R102 Pelvic and perineal pain: Secondary | ICD-10-CM | POA: Insufficient documentation

## 2014-06-24 DIAGNOSIS — Z510 Encounter for antineoplastic radiation therapy: Secondary | ICD-10-CM | POA: Diagnosis not present

## 2014-06-24 MED ORDER — RADIAPLEXRX EX GEL
Freq: Once | CUTANEOUS | Status: AC
  Administered 2014-06-24: 10:00:00 via TOPICAL

## 2014-06-24 NOTE — Progress Notes (Signed)
  Radiation Oncology         [phone]) [phone] ________________________________  Name: Nicholas Schmitt MRN: [id number]  Date: 06/24/2014  DOB: 1936/04/27  Weekly Radiation Therapy Management  Diagnosis: Stage IV non-small cell lung cancer  Current Dose: 5.0 Gy     Planned Dose:  35 Gy  Narrative . . . . . . . . The patient presents for routine under treatment assessment.                                   The patient is without complaint except for pain in the right pelvis region. He will start his radiation treatments to the lumbar spine area Wednesday. No breathing problems at this time.                                 Set-up films were reviewed.                                 The chart was checked. Physical Findings. . .  height is 6' (1.829 m) and weight is 204 lb (92.534 kg). His oral temperature is 97.9 F (36.6 C). His blood pressure is 106/69 and his pulse is 77. His respiration is 20 and oxygen saturation is 99%. . The lungs are clear. The heart has a regular rhythm and rate. Impression . . . . . . . The patient is tolerating radiation. Plan . . . . . . . . . . . . Continue treatment as planned.  ________________________________   Blair Promise, PhD, MD

## 2014-06-24 NOTE — Progress Notes (Addendum)
Nicholas Schmitt has completed 2 fractions to his right chest.  He reports pain in his right hip at a 4/10 today.  He reports it is worse with activity.  He takes aleve for the pain about once per day.  He reports shortness of breath with activity.  His oxygen saturation today is 99% on room air.  He reports a frequent cough and brings up white sputum.  He denies hemoptysis.  He reports the back of his neck is hurting during treatments.  He reports having a headache yesterday.  He was given the Radiation Therapy and You book and discussed potential side effects including fatigue, skin chagres, throat changes and nausea.  He was given radiaplex gel and was instructed to apply it twice a day to his right chest/back and lower back.

## 2014-06-24 NOTE — Progress Notes (Signed)
[facility] Psychosocial Distress Screening Clinical Social Work  Clinical Social Work was referred by distress screening protocol.  The patient scored a 6 on the Psychosocial Distress Thermometer which indicates moderate distress. Clinical Social Worker contacted patient by phone to assess for distress and other psychosocial needs. Patient reported he has no concerns at this time and he is currently receiving radiation treatment.  Patient reports his emotional/spiritual concerns previously indicated are "a little better".  CSW gave information on spiritual care services at [facility]- patient reported he is "a born again [place] right now".  CSW encouraged patient to call if he is interested in support services at a later time.  ONCBCN DISTRESS SCREENING 06/18/2014  Screening Type Initial Screening  Distress experienced in past week (1-10) 6  Emotional problem type Nervousness/Anxiety;Adjusting to illness  Spiritual/Religous concerns type Relating to God  Information Concerns Type Lack of info about maintaining fitness  Physical Problem type Pain;Breathing  Physician notified of physical symptoms Yes    Clinical Social Worker follow up needed: No.  If yes, follow up plan:  Polo Riley, MSW, LCSW, OSW-C Clinical Social Worker [facility] [phone]

## 2014-06-25 ENCOUNTER — Ambulatory Visit
Admission: RE | Admit: 2014-06-25 | Discharge: 2014-06-25 | Disposition: A | Discharge status: Home / Self Care | Payer: Medicare Other | Source: Ambulatory Visit | Attending: Radiation Oncology | Admitting: Radiation Oncology

## 2014-06-25 DIAGNOSIS — C3491 Malignant neoplasm of unspecified part of right bronchus or lung: Secondary | ICD-10-CM

## 2014-06-25 DIAGNOSIS — Z510 Encounter for antineoplastic radiation therapy: Secondary | ICD-10-CM | POA: Diagnosis not present

## 2014-06-25 NOTE — Progress Notes (Signed)
  Radiation Oncology         [phone] ________________________________  Name: Nicholas Schmitt MRN: [id number]  Date: 06/25/2014  DOB: [birth year]  Simulation Verification Note    ICD-9-CM ICD-10-CM   1. Non-small cell carcinoma of lung, stage 4, right 162.9 C34.91     Status: outpatient  NARRATIVE: The patient was brought to the treatment unit and placed in the planned treatment position. The clinical setup was verified. Then port films were obtained and uploaded to the radiation oncology medical record software.  The treatment beams were carefully compared against the planned radiation fields. The position location and shape of the radiation fields was reviewed. They targeted volume of tissue appears to be appropriately covered by the radiation beams. Organs at risk appear to be excluded as planned.  Based on my personal review, I approved the simulation verification. The patient's treatment will proceed as planned.  -----------------------------------  Blair Promise, PhD, MD

## 2014-06-26 ENCOUNTER — Ambulatory Visit
Admission: RE | Admit: 2014-06-26 | Discharge: 2014-06-26 | Disposition: A | Discharge status: Home / Self Care | Payer: Medicare Other | Source: Ambulatory Visit | Attending: Radiation Oncology | Admitting: Radiation Oncology

## 2014-06-26 DIAGNOSIS — Z510 Encounter for antineoplastic radiation therapy: Secondary | ICD-10-CM | POA: Diagnosis not present

## 2014-06-27 ENCOUNTER — Ambulatory Visit
Admission: RE | Admit: 2014-06-27 | Discharge: 2014-06-27 | Disposition: A | Discharge status: Home / Self Care | Payer: Medicare Other | Source: Ambulatory Visit | Attending: Radiation Oncology | Admitting: Radiation Oncology

## 2014-06-27 DIAGNOSIS — Z510 Encounter for antineoplastic radiation therapy: Secondary | ICD-10-CM | POA: Diagnosis not present

## 2014-06-30 ENCOUNTER — Ambulatory Visit
Admission: RE | Admit: 2014-06-30 | Discharge: 2014-06-30 | Disposition: A | Discharge status: Home / Self Care | Payer: Medicare Other | Source: Ambulatory Visit | Attending: Radiation Oncology | Admitting: Radiation Oncology

## 2014-06-30 DIAGNOSIS — Z510 Encounter for antineoplastic radiation therapy: Secondary | ICD-10-CM | POA: Diagnosis not present

## 2014-07-01 ENCOUNTER — Ambulatory Visit
Admission: RE | Admit: 2014-07-01 | Discharge: 2014-07-01 | Disposition: A | Discharge status: Home / Self Care | Payer: Medicare Other | Source: Ambulatory Visit | Attending: Radiation Oncology | Admitting: Radiation Oncology

## 2014-07-01 ENCOUNTER — Encounter: Payer: Self-pay | Admitting: Radiation Oncology

## 2014-07-01 VITALS — BP 135/62 | HR 86 | Temp 98.1°F | Resp 20 | Ht 72.0 in | Wt 207.2 lb

## 2014-07-01 DIAGNOSIS — Z510 Encounter for antineoplastic radiation therapy: Secondary | ICD-10-CM | POA: Diagnosis not present

## 2014-07-01 DIAGNOSIS — C3491 Malignant neoplasm of unspecified part of right bronchus or lung: Secondary | ICD-10-CM

## 2014-07-01 NOTE — Addendum Note (Signed)
Encounter addended by: Jacqulyn Liner, RN on: 07/01/2014  9:19 AM<BR>     Documentation filed: Notes Section

## 2014-07-01 NOTE — Progress Notes (Signed)
Griff Badley has completed 7 fractions to his right chest and lumbar spine.  He reports pain in his lower back at a 4/10.  He is taking aleve twice a day which helps.  He reports wheezing at night if he lays on his right side.  It goes away if he gets up and walks around.  He continues to report shortness of breath of with activity.  His oxygen saturation today was 100% on room air.  He reports a cough with sputum.  He denies hemoptysis.  He reports his throat feels a little raw.  He does not have any trouble swallowing.  He denies nausea.  He denies fatigue.  The skin on his chest is pink.  He is using radiaplex.  BP 135/62 mmHg  Pulse 86  Temp(Src) 98.1 F (36.7 C) (Oral)  Resp 20  Ht 6' (1.829 m)  Wt 207 lb 3.2 oz (93.985 kg)  BMI 28.10 kg/m2  SpO2 100%

## 2014-07-01 NOTE — Progress Notes (Signed)
  Radiation Oncology         [phone] ________________________________  Name: Nicholas Schmitt MRN: [id number]  Date: 07/01/2014  DOB: 1936/05/08  Weekly Radiation Therapy Management  Diagnosis: Stage IV non-small cell lung cancer  Current Dose: 17.5 Gy     Planned Dose:  35 Gy  Narrative . . . . . . . . The patient presents for routine under treatment assessment.                                   The patient is without complaint. His pain in the lower back area has not changed as of yet. The patient denies any nausea. He continues to report shortness of breath with exertion.                                 Set-up films were reviewed.                                 The chart was checked. Physical Findings. . .  height is 6' (1.829 m) and weight is 207 lb 3.2 oz (93.985 kg). His oral temperature is 98.1 F (36.7 C). His blood pressure is 135/62 and his pulse is 86. His respiration is 20 and oxygen saturation is 100%. . The lungs are clear. The heart has a regular rhythm and rate. Impression . . . . . . . The patient is tolerating radiation. Plan . . . . . . . . . . . . Continue treatment as planned.  ________________________________   Blair Promise, PhD, MD

## 2014-07-02 ENCOUNTER — Other Ambulatory Visit (HOSPITAL_BASED_OUTPATIENT_CLINIC_OR_DEPARTMENT_OTHER): Payer: Medicare Other

## 2014-07-02 ENCOUNTER — Telehealth: Payer: Self-pay | Admitting: Internal Medicine

## 2014-07-02 ENCOUNTER — Encounter: Payer: Self-pay | Admitting: Internal Medicine

## 2014-07-02 ENCOUNTER — Ambulatory Visit (HOSPITAL_BASED_OUTPATIENT_CLINIC_OR_DEPARTMENT_OTHER): Payer: Medicare Other | Admitting: Internal Medicine

## 2014-07-02 ENCOUNTER — Ambulatory Visit
Admission: RE | Admit: 2014-07-02 | Discharge: 2014-07-02 | Disposition: A | Discharge status: Home / Self Care | Payer: Medicare Other | Source: Ambulatory Visit | Attending: Radiation Oncology | Admitting: Radiation Oncology

## 2014-07-02 VITALS — BP 130/60 | HR 88 | Temp 97.4°F | Resp 18 | Ht 72.0 in | Wt 206.8 lb

## 2014-07-02 DIAGNOSIS — C3411 Malignant neoplasm of upper lobe, right bronchus or lung: Secondary | ICD-10-CM

## 2014-07-02 DIAGNOSIS — Z510 Encounter for antineoplastic radiation therapy: Secondary | ICD-10-CM | POA: Diagnosis not present

## 2014-07-02 DIAGNOSIS — E279 Disorder of adrenal gland, unspecified: Secondary | ICD-10-CM

## 2014-07-02 DIAGNOSIS — C3491 Malignant neoplasm of unspecified part of right bronchus or lung: Secondary | ICD-10-CM

## 2014-07-02 DIAGNOSIS — R911 Solitary pulmonary nodule: Secondary | ICD-10-CM

## 2014-07-02 LAB — COMPREHENSIVE METABOLIC PANEL (CC13)
ALBUMIN: 3.2 g/dL — AB (ref 3.5–5.0)
ALT: 17 U/L (ref 0–55)
AST: 19 U/L (ref 5–34)
Alkaline Phosphatase: 99 U/L (ref 40–150)
Anion Gap: 8 mEq/L (ref 3–11)
BUN: 16.2 mg/dL (ref 7.0–26.0)
CALCIUM: 9.3 mg/dL (ref 8.4–10.4)
CO2: 25 mEq/L (ref 22–29)
Chloride: 106 mEq/L (ref 98–109)
Creatinine: 0.8 mg/dL (ref 0.7–1.3)
EGFR: 85 mL/min/{1.73_m2} — ABNORMAL LOW (ref >90)
Glucose: 92 mg/dl (ref 70–140)
POTASSIUM: 3.9 meq/L (ref 3.5–5.1)
Sodium: 140 mEq/L (ref 136–145)
Total Bilirubin: 0.85 mg/dL (ref 0.20–1.20)
Total Protein: 6.6 g/dL (ref 6.4–8.3)

## 2014-07-02 LAB — CBC WITH DIFFERENTIAL/PLATELET
BASO%: 0.4 % (ref 0.0–2.0)
Basophils Absolute: 0 10*3/uL (ref 0.0–0.1)
EOS%: 2.9 % (ref 0.0–7.0)
Eosinophils Absolute: 0.1 10*3/uL (ref 0.0–0.5)
HCT: 28.9 % — ABNORMAL LOW (ref 38.4–49.9)
HGB: 9.3 g/dL — ABNORMAL LOW (ref 13.0–17.1)
LYMPH%: 9.1 % — ABNORMAL LOW (ref 14.0–49.0)
MCH: 30.5 pg (ref 27.2–33.4)
MCHC: 32.2 g/dL (ref 32.0–36.0)
MCV: 94.8 fL (ref 79.3–98.0)
MONO#: 0.4 10*3/uL (ref 0.1–0.9)
MONO%: 13.8 % (ref 0.0–14.0)
NEUT#: 2 10*3/uL (ref 1.5–6.5)
NEUT%: 73.8 % (ref 39.0–75.0)
Platelets: 141 10*3/uL (ref 140–400)
RBC: 3.05 10*6/uL — AB (ref 4.20–5.82)
RDW: 18.8 % — AB (ref 11.0–14.6)
WBC: 2.8 10*3/uL — ABNORMAL LOW (ref 4.0–10.3)
lymph#: 0.3 10*3/uL — ABNORMAL LOW (ref 0.9–3.3)

## 2014-07-02 NOTE — Telephone Encounter (Signed)
gave pt avs report schedule for feb

## 2014-07-02 NOTE — Progress Notes (Signed)
[facility] [phone]   [phone]  OFFICE PROGRESS NOTE  [name],[name] [name], MD [place] [address]  DIAGNOSIS: Non-small cell carcinoma of lung, stage 4  Primary site: Lung (Right)  Staging method: AJCC 7th Edition  Clinical: Stage IV (T1a, N3, M1b) signed by Curt Bears, MD on 02/01/2014 1:42 PM  Summary: Stage IV (T1a, N3, M1b) Prostate cancer  Primary site: Prostate  Clinical: Stage I (T1c, NX, M0) signed by Wyatt Portela, MD on 08/06/2013 1:59 PM  Summary: Stage I (T1c, NX, M0)  PRIOR THERAPY: Systemic chemotherapy with carboplatin for an AUC of 5 and Alimta 500 mg per meter squared given every 3 weeks. Status post 6 cycles, last dose was given 05/20/2014 discontinued secondary to disease progression.  CURRENT THERAPY: Immunotherapy with Ketruda (pembrolizumab) 2 MG/KG every 3 weeks. First dose 07/14/2014. He has positive PDL 1 expression (90%).  INTERVAL HISTORY: Nicholas Schmitt 79 y.o. male returns to the clinic today for follow-up visit accompanied by his wife. The patient is currently undergoing palliative radiotherapy to the right upper chest lesion in addition to the lumbar spine under the care of Dr. Sondra Come. He has 7 more fractions of radiation therapy. He is feeling fine today was no specific complaints except for mild fatigue. He denied having any significant fever or chills, no nausea or vomiting. The patient denied having any significant chest pain, shortness of breath except with exertion, cough or hemoptysis. The PDL 1 expression for Ketruda (pembrolizumab) was positive. He is here today for evaluation and discussion of his treatment options.  MEDICAL HISTORY: Past Medical History  Diagnosis Date  . Hypertension   . CAD (coronary artery disease)     positive stress test in 2006 led to left heart cath (8/06) showing 95% prox RCA, 90% CFX, and 90% mLAD.  patient had a cypher DES to all 3  lesions  . Hyperlipidemia   . Hearing loss   . AAA (abdominal aortic aneurysm) 11/09    3.2 cm   . Chronic renal insufficiency   . History of radiation therapy 03/08/04- 04/08/04    prostate 4600 cGy in 3 fractions, radioactive seed implant 05/04/04  . Myocardial infarction 1995  . Pacemaker 2014  . Dysrhythmia   . Shortness of breath     exertion  . Asthma     bronchial  . COPD (chronic obstructive pulmonary disease)   . Cancer 2015    prostate   . Prostate cancer 2005    gleason 7  . Skin cancer     ear  . Lung cancer     ALLERGIES:  has No Known Allergies.  MEDICATIONS:  Current Outpatient Prescriptions  Medication Sig Dispense Refill  . albuterol (PROVENTIL,VENTOLIN) 90 MCG/ACT inhaler Inhale 2 puffs into the lungs every 6 (six) hours as needed for wheezing or shortness of breath.    . budesonide-formoterol (SYMBICORT) 160-4.5 MCG/ACT inhaler 1 puff twice daily 4 Inhaler 3  . clopidogrel (PLAVIX) 75 MG tablet Take 1 tablet (75 mg total) by mouth daily. 100 tablet 3  . dexamethasone (DECADRON) 4 MG tablet 4 mg by mouth twice a day the day before, day of and day after the chemotherapy every 3 weeks. 40 tablet 1  . folic acid (FOLVITE) 1 MG tablet Take 1 tablet (1 mg total) by mouth daily. 30 tablet 3  . hyaluronate sodium (RADIAPLEXRX) GEL Apply 1 application topically once.    [name] losartan (COZAAR)  50 MG tablet Take 1 tablet (50 mg total) by mouth daily. (Patient taking differently: Half tab daily) 90 tablet 3  . Multiple Minerals (CALCIUM/MAGNESIUM/ZINC PO) Take 1 capsule by mouth every evening.    . Multiple Vitamin (MULTIVITAMIN) tablet Take 1 tablet by mouth daily.     . naproxen sodium (ANAPROX) 220 MG tablet Take 440 mg by mouth 2 (two) times daily as needed (pain).     . nitroGLYCERIN (NITROSTAT) 0.4 MG SL tablet Place 0.4 mg under the tongue every 5 (five) minutes as needed for chest pain (MAX 3 TABLETS).    [name] oxyCODONE (OXY IR/ROXICODONE) 5 MG immediate release  tablet Take 1-2 tablets (5-10 mg total) by mouth every 4 (four) hours as needed for moderate pain. 30 tablet 0  . prochlorperazine (COMPAZINE) 10 MG tablet Take 1 tablet (10 mg total) by mouth every 6 (six) hours as needed for nausea or vomiting. 30 tablet 0   No current facility-administered medications for this visit.    SURGICAL HISTORY:  Past Surgical History  Procedure Laterality Date  . Ptca  2006  . Coronary stent placement    . Permanent pacemaker insertion Bilateral 02/26/13    MDT Adapta L implanted by Dr Rayann Heman for sick sinus syndrome  . Radioactive seed implant  05/04/2004    8000 cGy, Dr Danny Lawless Dr Reece Agar  . Elbow surgery Bilateral 1973  . Insert / replace / remove pacemaker    . Coronary angioplasty    . Mediastinoscopy N/A 01/28/2014    Procedure: MEDIASTINOSCOPY;  Surgeon: Melrose Nakayama, MD;  Location: [facility];  Service: Thoracic;  Laterality: N/A;  . Permanent pacemaker insertion N/A 02/26/2013    Procedure: PERMANENT PACEMAKER INSERTION;  Surgeon: [name], MD;  Location: [facility] CATH [facility];  Service: Cardiovascular;  Laterality: N/A;    REVIEW OF SYSTEMS:  Constitutional: positive for fatigue Eyes: negative Ears, nose, mouth, throat, and face: negative Respiratory: negative Cardiovascular: negative Gastrointestinal: negative Genitourinary:negative Integument/breast: negative Hematologic/lymphatic: negative Musculoskeletal:negative Neurological: negative Behavioral/Psych: negative Endocrine: negative Allergic/Immunologic: negative   PHYSICAL EXAMINATION: General appearance: alert, cooperative, fatigued and no distress Head: Normocephalic, without obvious abnormality, atraumatic Neck: no adenopathy, no carotid bruit, supple, symmetrical, trachea midline and thyroid not enlarged, symmetric, no tenderness/mass/nodules Lymph nodes: Cervical, supraclavicular, and axillary nodes normal. Resp: clear to auscultation bilaterally Back: symmetric, no  curvature. ROM normal. No CVA tenderness. Cardio: regular rate and rhythm, S1, S2 normal, no murmur, click, rub or gallop GI: soft, non-tender; bowel sounds normal; no masses,  no organomegaly Extremities: extremities normal, atraumatic, no cyanosis or edema Neurologic: Alert and oriented X 3, normal strength and tone. Normal symmetric reflexes. Normal coordination and gait  ECOG PERFORMANCE STATUS: 1 - Symptomatic but completely ambulatory  Blood pressure 130/60, pulse 88, temperature 97.4 F (36.3 C), resp. rate 18, height 6' (1.829 m), weight 206 lb 12.8 oz (93.804 kg), SpO2 100 %.  LABORATORY DATA: Lab Results  Component Value Date   WBC 2.8* 07/02/2014   HGB 9.3* 07/02/2014   HCT 28.9* 07/02/2014   MCV 94.8 07/02/2014   PLT 141 07/02/2014      Chemistry      Component Value Date/Time   NA 140 07/02/2014 0907   NA 141 04/15/2014 0755   K 3.9 07/02/2014 0907   K 5.0 04/15/2014 0755   CL 109 04/15/2014 0755   CO2 25 07/02/2014 0907   CO2 26 04/15/2014 0755   BUN 16.2 07/02/2014 0907   BUN 19 04/15/2014 0755  CREATININE 0.8 07/02/2014 0907   CREATININE 0.8 04/15/2014 0755      Component Value Date/Time   CALCIUM 9.3 07/02/2014 0907   CALCIUM 9.2 04/15/2014 0755   ALKPHOS 99 07/02/2014 0907   ALKPHOS 87 04/15/2014 0755   AST 19 07/02/2014 0907   AST 24 04/15/2014 0755   ALT 17 07/02/2014 0907   ALT 24 04/15/2014 0755   BILITOT 0.85 07/02/2014 0907   BILITOT 1.3* 04/15/2014 0755       RADIOGRAPHIC STUDIES: Dg Chest 2 View  06/02/2014   CLINICAL DATA:  Shortness of breath.  EXAM: CHEST  2 VIEW  COMPARISON:  CT 03/24/2013. Chest x-ray 01/24/2014, 02/27/2013, 07/22/2010.  FINDINGS: Cardiac pacer noted with lead tips in right atrium and right ventricle. Heart size normal. Right upper lobe and bibasilar pleural parenchymal thickening noted consistent with scarring. Mild superimposed infiltrate right lung base cannot be excluded. No pleural effusion or pneumothorax.  Stable cardiomegaly normal pulmonary vascularity a cardiac pacer lead tips in right atrium and right ventricle. No acute bony abnormality.  IMPRESSION: 1. Right upper lobe and bibasilar pleural parenchymal thickening consistent with scarring. Mild superimposed acute infiltrate in the right lung base cannot be excluded. 2. Stable cardiomegaly, no CHF.  Cardiac pacer in stable position.   Electronically Signed   By: Marcello Moores  Register   On: 06/02/2014 12:04   Ct Chest W Contrast  06/04/2014   CLINICAL DATA:  Stage 4 non-small-cell cancer post radiation therapy, ongoing chemotherapy, restaging; history prostate cancer 2004, COPD with shortness of breath, coronary artery disease post MI and coronary stenting, hypertension  EXAM: CT CHEST, ABDOMEN, AND PELVIS WITH CONTRAST  TECHNIQUE: Multidetector CT imaging of the chest, abdomen and pelvis was performed following the standard protocol during bolus administration of intravenous contrast. Sagittal and coronal MPR images reconstructed from axial data set.  CONTRAST:  119mL OMNIPAQUE IOHEXOL 300 MG/ML  SOLN  COMPARISON:  CT chest abdomen and pelvis 03/24/2014  FINDINGS: CT CHEST FINDINGS  Thoracic vascular structures patent on nondedicated exam.  Scattered atherosclerotic calcifications aorta and throughout coronary system.  Mediastinal adenopathy again identified;  RIGHT prevascular node 20 mm short axis image 19 unchanged;  Lower RIGHT paratracheal node 21 mm image 20 unchanged;  Node anterior to RIGHT mainstem bronchus 18 mm image 238 unchanged;  15 mm short axis RIGHT hilar node image 26 previously 11 mm.  No new thoracic adenopathy.  Posterior LEFT diaphragmatic defect, Bochdalek type with herniation of fat.  Pacemaker leads RIGHT atrium and RIGHT ventricle.  New anterior RIGHT upper lobe spiculated nodule compatible with neoplasm, 12 x 9 mm image 13 with associated pleural thickening.  Spiculated poorly defined posterior RIGHT upper lobe nodular density 15 x 9 mm  image 12 previously 15 x 10 mm.  6 mm RIGHT apex nodule image 9 unchanged.  3 mm LEFT lower lobe nodule image 32 unchanged.  Scattered peribronchial thickening and interstitial prominence unchanged.  Paramediastinal atelectasis or radiation change in medial RIGHT middle lobe.  No additional mass, nodule, infiltrate, pleural effusion, or pneumothorax.  No definite osseous metastases.  CT ABDOMEN AND PELVIS FINDINGS  Low-attenuation hepatic lesions likely cysts unchanged largest 20 x 14 mm LEFT lobe image 44.  BILATERAL renal cysts, largest 3.6 x 3.5 cm upper pole LEFT kidney image 56 unchanged.  Nodular enlargement of RIGHT adrenal gland 3.0 x 1.3 cm increased in size since previous study when this measured approximately 2.1 x 1.4 cm  Liver, spleen, pancreas, kidneys, and LEFT adrenal gland otherwise  normal.  Normal appendix.  Stomach and bowel loops normal appearance.  Unremarkable bladder and ureters.  Brachytherapy seed implants in prostate bed.  LEFT inguinal hernia containing fat  No mass, adenopathy, free fluid or inflammatory process.  Aneurysmal dilatation distal abdominal aorta 3.4 x 3.3 cm image 85 unchanged.  Subtle area of osseous lucency LEFT lateral aspect of L2 vertebral body.  Probable lytic metastasis RIGHT lateral aspect of L5 vertebral body 20 x 14 mm image 92.  Probable lytic osseous metastasis RIGHT sacrum 27 x 21 mm image 97, slightly increased.  Degenerative disc disease changes lumbar spine.  IMPRESSION: Stable spiculated density posterior RIGHT upper lobe 15 x 9 mm.  New 12 x 9 mm spiculated anterior LEFT upper lobe density with associated pleural thickening compatible with neoplasm, primary versus metastatic.  Stable BILATERAL tiny lung nodules.  Increased size of RIGHT adrenal mass.  Stable hepatic and renal cysts.  LEFT inguinal hernia.  Stable abdominal aortic aneurysm 3.4 x 3.3 cm.  Probable osseous metastasis RIGHT L5 vertebra and RIGHT sacrum, both slightly larger than on previous  exam.   Electronically Signed   By: Lavonia Dana M.D.   On: 06/04/2014 09:34   Ct Angio Chest Pe W/cm &/or Wo Cm  06/17/2014   CLINICAL DATA:  Acute onset of shortness of breath and back pain. New diagnosis of lung cancer.  EXAM: CT ANGIOGRAPHY CHEST WITH CONTRAST  TECHNIQUE: Multidetector CT imaging of the chest was performed using the standard protocol during bolus administration of intravenous contrast. Multiplanar CT image reconstructions and MIPs were obtained to evaluate the vascular anatomy.  CONTRAST:  157mL OMNIPAQUE IOHEXOL 350 MG/ML SOLN  COMPARISON:  06/04/2014.  FINDINGS: Chest wall: Stable borderline enlarged right axillary lymph nodes. No supraclavicular adenopathy. The thyroid gland is grossly normal. The bony thorax is intact. No destructive bone lesions or spinal canal compromise. Stable degenerative changes.  Mediastinum: The heart is normal in size. No pericardial effusion. Stable advanced atherosclerotic calcifications involving the aorta and branch vessels including the coronary arteries. Stable mediastinal and right hilar lymphadenopathy with mild compression of the SVC. The esophagus is grossly normal.  The pulmonary arterial tree is well opacified. No filling defects to suggest pulmonary embolism. There is significant compression of the right pulmonary artery in the right hilum and the upper lobe pulmonary artery is occluded. This is chronic.  Lungs: Stable spiculated right upper lobe lung lesions and stable emphysematous changes and pulmonary scarring. No pleural effusion.  Upper abdomen: Stable hepatic cysts. Stable right adrenal metastasis. Stable upper pole left renal cysts.  Review of the MIP images confirms the above findings.  IMPRESSION: 1. No CT findings for pulmonary embolism. 2. Severe compression of the right pulmonary artery in the right hilum. The right upper lobe pulmonary artery is occluded by surrounding tumor. There is also compression of the SVC. 3. Stable spiculated lung  lesions in the right upper lobe along with right hilar and mediastinal lymphadenopathy. 4. No acute pulmonary findings. Chronic emphysematous changes and pulmonary scarring with scattered small pulmonary nodules. 5. Stable right adrenal gland metastasis.   Electronically Signed   By: Kalman Jewels M.D.   On: 06/17/2014 15:44   Ct Abdomen Pelvis W Contrast  06/04/2014   CLINICAL DATA:  Stage 4 non-small-cell cancer post radiation therapy, ongoing chemotherapy, restaging; history prostate cancer 2004, COPD with shortness of breath, coronary artery disease post MI and coronary stenting, hypertension  EXAM: CT CHEST, ABDOMEN, AND PELVIS WITH CONTRAST  TECHNIQUE: Multidetector CT  imaging of the chest, abdomen and pelvis was performed following the standard protocol during bolus administration of intravenous contrast. Sagittal and coronal MPR images reconstructed from axial data set.  CONTRAST:  118mL OMNIPAQUE IOHEXOL 300 MG/ML  SOLN  COMPARISON:  CT chest abdomen and pelvis 03/24/2014  FINDINGS: CT CHEST FINDINGS  Thoracic vascular structures patent on nondedicated exam.  Scattered atherosclerotic calcifications aorta and throughout coronary system.  Mediastinal adenopathy again identified;  RIGHT prevascular node 20 mm short axis image 19 unchanged;  Lower RIGHT paratracheal node 21 mm image 20 unchanged;  Node anterior to RIGHT mainstem bronchus 18 mm image 238 unchanged;  15 mm short axis RIGHT hilar node image 26 previously 11 mm.  No new thoracic adenopathy.  Posterior LEFT diaphragmatic defect, Bochdalek type with herniation of fat.  Pacemaker leads RIGHT atrium and RIGHT ventricle.  New anterior RIGHT upper lobe spiculated nodule compatible with neoplasm, 12 x 9 mm image 13 with associated pleural thickening.  Spiculated poorly defined posterior RIGHT upper lobe nodular density 15 x 9 mm image 12 previously 15 x 10 mm.  6 mm RIGHT apex nodule image 9 unchanged.  3 mm LEFT lower lobe nodule image 32  unchanged.  Scattered peribronchial thickening and interstitial prominence unchanged.  Paramediastinal atelectasis or radiation change in medial RIGHT middle lobe.  No additional mass, nodule, infiltrate, pleural effusion, or pneumothorax.  No definite osseous metastases.  CT ABDOMEN AND PELVIS FINDINGS  Low-attenuation hepatic lesions likely cysts unchanged largest 20 x 14 mm LEFT lobe image 44.  BILATERAL renal cysts, largest 3.6 x 3.5 cm upper pole LEFT kidney image 56 unchanged.  Nodular enlargement of RIGHT adrenal gland 3.0 x 1.3 cm increased in size since previous study when this measured approximately 2.1 x 1.4 cm  Liver, spleen, pancreas, kidneys, and LEFT adrenal gland otherwise normal.  Normal appendix.  Stomach and bowel loops normal appearance.  Unremarkable bladder and ureters.  Brachytherapy seed implants in prostate bed.  LEFT inguinal hernia containing fat  No mass, adenopathy, free fluid or inflammatory process.  Aneurysmal dilatation distal abdominal aorta 3.4 x 3.3 cm image 85 unchanged.  Subtle area of osseous lucency LEFT lateral aspect of L2 vertebral body.  Probable lytic metastasis RIGHT lateral aspect of L5 vertebral body 20 x 14 mm image 92.  Probable lytic osseous metastasis RIGHT sacrum 27 x 21 mm image 97, slightly increased.  Degenerative disc disease changes lumbar spine.  IMPRESSION: Stable spiculated density posterior RIGHT upper lobe 15 x 9 mm.  New 12 x 9 mm spiculated anterior LEFT upper lobe density with associated pleural thickening compatible with neoplasm, primary versus metastatic.  Stable BILATERAL tiny lung nodules.  Increased size of RIGHT adrenal mass.  Stable hepatic and renal cysts.  LEFT inguinal hernia.  Stable abdominal aortic aneurysm 3.4 x 3.3 cm.  Probable osseous metastasis RIGHT L5 vertebra and RIGHT sacrum, both slightly larger than on previous exam.   Electronically Signed   By: Lavonia Dana M.D.   On: 06/04/2014 09:34    ASSESSMENT AND PLAN: this is a very  pleasant 79 years old white male with stage IV non-small cell lung cancer, adenocarcinoma with positive PDL 1 expression, completed systemic chemotherapy with carboplatin and Alimta status post 6 cycles and tolerated his treatment fairly well.  The recent CT scan of the chest, abdomen and pelvis showed evidence for disease progression with new spiculated left upper lobe nodule in addition to increased in the size of the right adrenal gland. I  had a lengthy discussion with the patient and his wife today about his treatment options. I recommended for the patient treatment with Ketruda (pembrolizumab) 2 MG/KG every 3 weeks. I discussed with the patient adverse effect of this treatment including immune mediated skin reaction, pneumonitis, hypothyroidism, hyperthyroidism, endocrine dysfunction, liver or renal dysfunction as well as diarrhea. He would like to proceed with the treatment as planned. We will start the first cycle of this treatment on 07/14/2014 after completion of his current palliative radiotherapy. He would come back for follow-up visit at that time. The patient was advised to call immediately if he has any concerning symptoms in the interval. The patient voices understanding of current disease status and treatment options and is in agreement with the current care plan.  All questions were answered. The patient knows to call the clinic with any problems, questions or concerns. We can certainly see the patient much sooner if necessary.  I spent 15 minutes counseling the patient face to face. The total time spent in the appointment was 25 minutes.  Disclaimer: This note was dictated with voice recognition software. Similar sounding words can inadvertently be transcribed and may not be corrected upon review.

## 2014-07-03 ENCOUNTER — Ambulatory Visit
Admission: RE | Admit: 2014-07-03 | Discharge: 2014-07-03 | Disposition: A | Discharge status: Home / Self Care | Payer: Medicare Other | Source: Ambulatory Visit | Attending: Radiation Oncology | Admitting: Radiation Oncology

## 2014-07-03 DIAGNOSIS — Z510 Encounter for antineoplastic radiation therapy: Secondary | ICD-10-CM | POA: Diagnosis not present

## 2014-07-04 ENCOUNTER — Ambulatory Visit
Admission: RE | Admit: 2014-07-04 | Discharge: 2014-07-04 | Disposition: A | Discharge status: Home / Self Care | Payer: Medicare Other | Source: Ambulatory Visit | Attending: Radiation Oncology | Admitting: Radiation Oncology

## 2014-07-04 DIAGNOSIS — C3491 Malignant neoplasm of unspecified part of right bronchus or lung: Secondary | ICD-10-CM

## 2014-07-04 DIAGNOSIS — Z510 Encounter for antineoplastic radiation therapy: Secondary | ICD-10-CM | POA: Diagnosis not present

## 2014-07-04 MED ORDER — RADIAPLEXRX EX GEL
Freq: Once | CUTANEOUS | Status: AC
  Administered 2014-07-04: 09:00:00 via TOPICAL

## 2014-07-07 ENCOUNTER — Other Ambulatory Visit: Payer: Self-pay | Admitting: Internal Medicine

## 2014-07-07 ENCOUNTER — Ambulatory Visit: Admission: RE | Admit: 2014-07-07 | Payer: Medicare Other | Source: Ambulatory Visit

## 2014-07-07 ENCOUNTER — Encounter: Payer: Self-pay | Admitting: Internal Medicine

## 2014-07-07 ENCOUNTER — Ambulatory Visit (INDEPENDENT_AMBULATORY_CARE_PROVIDER_SITE_OTHER): Payer: Medicare Other | Admitting: Internal Medicine

## 2014-07-07 VITALS — BP 126/84 | HR 97 | Ht 71.0 in | Wt 209.0 lb

## 2014-07-07 DIAGNOSIS — R0600 Dyspnea, unspecified: Secondary | ICD-10-CM

## 2014-07-07 DIAGNOSIS — J449 Chronic obstructive pulmonary disease, unspecified: Secondary | ICD-10-CM

## 2014-07-07 LAB — PULMONARY FUNCTION TEST
DL/VA % pred: 72 %
DL/VA: 3.38 ml/min/mmHg/L
DLCO unc % pred: 46 %
DLCO unc: 15.57 ml/min/mmHg
FEF 25-75 POST: 1.82 L/s
FEF 25-75 Pre: 1.12 L/sec
FEF2575-%Change-Post: 62 %
FEF2575-%PRED-POST: 80 %
FEF2575-%Pred-Pre: 49 %
FEV1-%Change-Post: 10 %
FEV1-%Pred-Post: 73 %
FEV1-%Pred-Pre: 66 %
FEV1-PRE: 2.09 L
FEV1-Post: 2.31 L
FEV1FVC-%CHANGE-POST: 6 %
FEV1FVC-%Pred-Pre: 95 %
FEV6-%Change-Post: 5 %
FEV6-%PRED-POST: 76 %
FEV6-%PRED-PRE: 72 %
FEV6-Post: 3.13 L
FEV6-Pre: 2.98 L
FEV6FVC-%Change-Post: 1 %
FEV6FVC-%PRED-POST: 105 %
FEV6FVC-%Pred-Pre: 104 %
FVC-%CHANGE-POST: 3 %
FVC-%Pred-Post: 72 %
FVC-%Pred-Pre: 69 %
FVC-PRE: 3.04 L
FVC-Post: 3.16 L
Post FEV1/FVC ratio: 73 %
Post FEV6/FVC ratio: 99 %
Pre FEV1/FVC ratio: 69 %
Pre FEV6/FVC Ratio: 98 %

## 2014-07-07 NOTE — Progress Notes (Signed)
PFT done today. 

## 2014-07-07 NOTE — Assessment & Plan Note (Addendum)
Spirometry 06/02/14  2.41 (68%) ratio 69  - PFTs 07/07/2014 FEV1` 2.31 (73%) and ratio 73 p 10% better from saba with symbicort 160 am of ov and dlco 47%  The proper method of use, as well as anticipated side effects, of a metered-dose inhaler are discussed and demonstrated to the patient. Improved effectiveness after extensive coaching during this visit to a level of approximately  75%   I had an extended discussion with the patient reviewing all relevant studies completed to date and  lasting 15 to 20 minutes of a 25 minute visit on the following ongoing concerns:   1)  He barely has any airflow obst at all so no indication to escalate rx and might even try symbicort 80 2 bid if upper airway symptoms worsen  2) anemia seems to have been playing a sign role in his reproducible doe but doesn't explain the variability reported w/in a single day's span, which is more c/w asthma or variable upper airway obst from vcd/ gerd  Therefore rec continue gerd rx and symbicort and return here prn

## 2014-07-07 NOTE — Progress Notes (Signed)
Subjective:    Patient ID: Nicholas Schmitt, male    DOB: 08-09-1935,   MRN: [id number]    Brief patient profile:  34 yowm quit smoking 1995 with dx of [place] on treatment since summer 2015 doe on "prn symbicort x ? 2005 "  since summer of 2015 progressive doe referred 06/02/2014 to pulmonary clinic by Dr Earlie Server.   06/02/2014 1st [facility] office visit/ [facility]   Chief Complaint  Patient presents with  . Pulmonary Consult    Self referral. Pt c/o DOE for the past 6 months- worse x 6 wks with getting out of breath just walking approx 5 steps.   onset was indolent, pattern is overall progressive but still quite variable  present daily=  doe just with walking across the room vs walking across the parking lot, ie not proportionate even on same day, never at rest or sleeping. Not sure symbicort helps though hfa quite poor. rec Stay on symbicort 160 Take 2 puffs first thing in am and then another 2 puffs about 12 hours later.  Only use your albuterol (proventil)   Try prilosec 20mg   Take 30-60 min before first meal of the day and Pepcid 20 mg one bedtime until return to office Please remember to go to the lab and x-ray department downstairs for your tests - we will call you with the results when they are available.         07/07/2014 f/u ov/[facility] re:   doe / GOLD II(barely) COPD  : symbicort 160 2 every 12h and prn proventil  Chief Complaint  Patient presents with  . Follow-up    Pt states that his breathing seems slightly worse since the last visit. He c/o cough and sore throat- relates to RT. Cough is non prod. He is using albuterol inhaler 2 x per wk on average.   RT started 2 weeks prior to OV   Received 2 units of prbc's did helped the breathing some Main concern is nose clogged up 3-4 am with subjecive wheeze at that point only,  Not daytime wheezing. On day of pfts breathing "worse than usual" but barely has any airflow obst at all p am symbicort    No obvious day to day or  daytime variabilty or assoc   cp or chest tightness, subjective wheeze overt sinus or hb symptoms. No unusual exp hx or h/o childhood pna/ asthma or knowledge of premature birth.  Sleeping ok without nocturnal  or early am exacerbation  of respiratory  c/o's or need for noct saba. Also denies any obvious fluctuation of symptoms with weather or environmental changes or other aggravating or alleviating factors except as outlined above   Current Medications, Allergies, Complete Past Medical History, Past Surgical History, Family History, and Social History were reviewed in [employer] record.  ROS  The following are not active complaints unless bolded sore throat, dysphagia, dental problems, itching, sneezing,  nasal congestion or excess/ purulent secretions, ear ache,   fever, chills, sweats, unintended wt loss, pleuritic or exertional cp, hemoptysis,  orthopnea pnd or leg swelling, presyncope, palpitations, heartburn, abdominal pain, anorexia, nausea, vomiting, diarrhea  or change in bowel or urinary habits, change in stools or urine, dysuria,hematuria,  rash, arthralgias, visual complaints, headache, numbness weakness or ataxia or problems with walking or coordination,  change in mood/affect or memory.  Objective:   Physical Exam  amb mod hoarse wm nad  07/07/2014         210  Wt Readings from Last 3 Encounters:  06/02/14 209 lb (94.802 kg)  05/26/14 205 lb (92.987 kg)  05/20/14 208 lb 3.2 oz (94.439 kg)    Vital signs reviewed  HEENT mild turbinate edema.  Oropharynx no thrush or excess pnd or cobblestoning.  No JVD or cervical adenopathy. Mild accessory muscle hypertrophy. Trachea midline, nl thryroid. Chest was hyperinflated by percussion with diminished breath sounds and moderate increased exp time without wheeze. Hoover sign positive at mid inspiration. Regular rate and rhythm without murmur gallop or rub or increase P2 or  edema.  Abd: no hsm, nl excursion. Ext warm without cyanosis or clubbing.     CTa 06/17/13  Images reviewed   No CT findings for pulmonary embolism. 2. Severe compression of the right pulmonary artery in the right hilum. The right upper lobe pulmonary artery is occluded by surrounding tumor. There is also compression of the SVC. 3. Stable spiculated lung lesions in the right upper lobe along with right hilar and mediastinal lymphadenopathy. 4. No acute pulmonary findings. Chronic emphysematous changes and pulmonary scarring with scattered small pulmonary nodules. 5. Stable right adrenal gland metastasis.   Recent Labs Lab 06/03/14 1001  NA 140  K 3.7  CO2 23  BUN 18.5  CREATININE 0.8  GLUCOSE 137    Recent Labs Lab 06/03/14 1001  HGB 7.4*  HCT 22.7*  WBC 2.5*  PLT 84*     Lab Results  Component Value Date   TSH 2.84 04/15/2014     Lab Results  Component Value Date   PROBNP 146.5* 02/25/2013         Assessment & Plan:

## 2014-07-07 NOTE — Assessment & Plan Note (Signed)
-   06/02/2014   Walked RA x two laps @ 185 stopped due to  Sob/ fatigue nl pace, no desat  Noct symptoms are typical of an upper airway problem > ent eval prn though note the insp loop of his f/v is wnl so there is no mechanical reason for the sob he's experiencing at hs

## 2014-07-07 NOTE — Patient Instructions (Addendum)
Stay on symbicort 160 Take 2 puffs first thing in am and then another 2 puffs about 12 hours later.   Only use your albuterol (proventil) as a rescue medication to be used if you can't catch your breath by resting or doing a relaxed purse lip breathing pattern.  - The less you use it, the better it will work when you need it. - Ok to use up to 2 puffs  every 4 hours if you must but call for immediate appointment if use goes up over your usual need - Don't leave home without it !!  (think of it like the spare tire for your car)   Continue    prilosec 20mg   Take 30-60 min before first meal of the day and Pepcid 20 mg one bedtime for now  Pace yourself when walking, espeicially until your blood builds back up   [employer] out through your nose  If nasal symptoms continue to bother you when you sleep,  You will need to see an ENT doctor next but prefer your primary doctor to coordinate your referral  Pulmonary follow up is as needed

## 2014-07-08 ENCOUNTER — Encounter: Payer: Self-pay | Admitting: Radiation Oncology

## 2014-07-08 ENCOUNTER — Ambulatory Visit
Admission: RE | Admit: 2014-07-08 | Discharge: 2014-07-08 | Disposition: A | Discharge status: Home / Self Care | Payer: Medicare Other | Source: Ambulatory Visit | Attending: Radiation Oncology | Admitting: Radiation Oncology

## 2014-07-08 VITALS — BP 117/70 | HR 90 | Temp 98.3°F | Resp 20 | Ht 71.0 in | Wt 204.1 lb

## 2014-07-08 DIAGNOSIS — C61 Malignant neoplasm of prostate: Secondary | ICD-10-CM

## 2014-07-08 DIAGNOSIS — Z510 Encounter for antineoplastic radiation therapy: Secondary | ICD-10-CM | POA: Diagnosis not present

## 2014-07-08 MED ORDER — SUCRALFATE 1 GM/10ML PO SUSP: 1.0000 g | Freq: Three times a day (TID) | ORAL | Status: DC

## 2014-07-08 NOTE — Progress Notes (Signed)
  Radiation Oncology         [phone] ________________________________  Name: Nicholas Schmitt MRN: [id number]  Date: 07/08/2014  DOB: 07/20/1935  Weekly Radiation Therapy Management  Diagnosis: Stage IV non-small cell lung cancer  Current Dose: 27.5 Gy     Planned Dose:  35 Gy  Narrative . . . . . . . . The patient presents for routine under treatment assessment.                                   The patient  has noticed improvement in his pain along the lumbar spine. His pain along the chest area is unchanged at this time. He is having some pain with swallowing and will start using his oxycodone for this issue. I've also will give him a prescription for Carafate suspension to use one hour before meals and at bedtime                                 Set-up films were reviewed.                                 The chart was checked. Physical Findings. . .  height is 5\' 11"  (1.803 m) and weight is 204 lb 1.6 oz (92.579 kg). His oral temperature is 98.3 F (36.8 C). His blood pressure is 117/70 and his pulse is 90. His respiration is 20 and oxygen saturation is 100%. . The lungs are clear. The heart has a regular rhythm and rate. The abdomen is soft and nontender with normal bowel sounds. Impression . . . . . . . The patient is tolerating radiation. Plan . . . . . . . . . . . . Continue treatment as planned.  ________________________________   Blair Promise, PhD, MD

## 2014-07-08 NOTE — Progress Notes (Addendum)
Nicholas Schmitt has completed 11 fractions to his right chest and l spine.  He reports pain in his right upper back and also has a sore throat when swallowing.  He reports having to take small bites and chew really well to make the food go down.  He reports the pain in his hip has resolved.  He continues to have a cough with white sputum.  He reports shortness of breath with activity.  His oxygen sat today was 100% on room air.  He saw Dr. Melvyn Novas yesterday and was told his lungs were in good condition.  He reports a poor appetite and has lost 3 lbs since last week.  Orthostatic vitals done bp sitting: 117/70, hr 90, bp standing 139/126, hr 126.   He is taking losartan 25 mg daily.  He reports fatigue.  His skin is intact on his chest.  He is using radiaplex.  He also reports pain in his neck when laying on the treatment table.

## 2014-07-09 ENCOUNTER — Telehealth: Payer: Self-pay | Admitting: Medical Oncology

## 2014-07-09 ENCOUNTER — Ambulatory Visit
Admission: RE | Admit: 2014-07-09 | Discharge: 2014-07-09 | Disposition: A | Discharge status: Home / Self Care | Payer: Medicare Other | Source: Ambulatory Visit | Attending: Radiation Oncology | Admitting: Radiation Oncology

## 2014-07-09 DIAGNOSIS — Z510 Encounter for antineoplastic radiation therapy: Secondary | ICD-10-CM | POA: Diagnosis not present

## 2014-07-09 NOTE — Telephone Encounter (Signed)
Nicholas Schmitt called asking if pt needs to premedicate for Ketruda. I left this message on pt phone.

## 2014-07-10 ENCOUNTER — Ambulatory Visit: Payer: Medicare Other

## 2014-07-10 ENCOUNTER — Ambulatory Visit
Admission: RE | Admit: 2014-07-10 | Discharge: 2014-07-10 | Disposition: A | Discharge status: Home / Self Care | Payer: Medicare Other | Source: Ambulatory Visit | Attending: Radiation Oncology | Admitting: Radiation Oncology

## 2014-07-10 DIAGNOSIS — Z510 Encounter for antineoplastic radiation therapy: Secondary | ICD-10-CM | POA: Diagnosis not present

## 2014-07-11 ENCOUNTER — Ambulatory Visit
Admission: RE | Admit: 2014-07-11 | Discharge: 2014-07-11 | Disposition: A | Discharge status: Home / Self Care | Payer: Medicare Other | Source: Ambulatory Visit | Attending: Radiation Oncology | Admitting: Radiation Oncology

## 2014-07-11 ENCOUNTER — Telehealth: Payer: Self-pay | Admitting: Oncology

## 2014-07-11 ENCOUNTER — Ambulatory Visit: Payer: Medicare Other

## 2014-07-11 ENCOUNTER — Inpatient Hospital Stay
Admission: RE | Admit: 2014-07-11 | Discharge: 2014-07-11 | Disposition: A | Discharge status: Home / Self Care | Payer: Self-pay | Source: Ambulatory Visit | Attending: Radiation Oncology | Admitting: Radiation Oncology

## 2014-07-11 DIAGNOSIS — Z510 Encounter for antineoplastic radiation therapy: Secondary | ICD-10-CM | POA: Diagnosis not present

## 2014-07-11 NOTE — Telephone Encounter (Signed)
Called in prescription per Dr. Lisbeth Renshaw to [facility] on [address] for sucralfate (CARAFATE) 1 G tablets.  Take 1 g by mouth 4 (four) times daily - with meals and at bedtime. Dissolve tablets in 15-30 ml of water before taking. Take half hour before meals and at bedtime. Disp. 42 tablets. 0 refills.

## 2014-07-11 NOTE — Progress Notes (Signed)
Nicholas Schmitt has completed treatment with 14 fractions to his right chest and lumbar spine.  He reports pain at a 2/10 in his mid back region.  He continues to have shortness of breath with activity and is waking up once per night with nasal congestion and uses nasal spray before bed.  His oxygen saturation today was 100% on room air.  He reports pain with swallowing and burps often.  He is taking Tums and a gas medication.  He was not able to get Carafate due to the cost.  Will call his insurance to start a prior authorization.  He also reports having "cold chills".  He is taking goody powder once per day for this.  His temperature today was 98.4.  He continues to have a productive cough with white sputum.  He will start chemotherapy on Monday.

## 2014-07-14 ENCOUNTER — Telehealth: Payer: Self-pay | Admitting: Internal Medicine

## 2014-07-14 ENCOUNTER — Ambulatory Visit (HOSPITAL_BASED_OUTPATIENT_CLINIC_OR_DEPARTMENT_OTHER): Payer: Medicare Other | Admitting: Physician Assistant

## 2014-07-14 ENCOUNTER — Other Ambulatory Visit (HOSPITAL_BASED_OUTPATIENT_CLINIC_OR_DEPARTMENT_OTHER): Payer: Medicare Other

## 2014-07-14 ENCOUNTER — Ambulatory Visit (HOSPITAL_COMMUNITY)
Admission: RE | Admit: 2014-07-14 | Discharge: 2014-07-14 | Disposition: A | Discharge status: Home / Self Care | Payer: Medicare Other | Source: Ambulatory Visit | Attending: Physician Assistant | Admitting: Physician Assistant

## 2014-07-14 ENCOUNTER — Ambulatory Visit: Payer: Medicare Other

## 2014-07-14 ENCOUNTER — Ambulatory Visit (HOSPITAL_COMMUNITY)
Admission: RE | Admit: 2014-07-14 | Discharge: 2014-07-14 | Disposition: A | Discharge status: Home / Self Care | Payer: Medicare Other | Source: Ambulatory Visit | Attending: Internal Medicine | Admitting: Internal Medicine

## 2014-07-14 ENCOUNTER — Encounter: Payer: Self-pay | Admitting: Physician Assistant

## 2014-07-14 ENCOUNTER — Encounter (HOSPITAL_COMMUNITY): Payer: Self-pay

## 2014-07-14 VITALS — BP 115/61 | HR 83 | Temp 97.8°F | Resp 20 | Ht 71.0 in | Wt 198.6 lb

## 2014-07-14 DIAGNOSIS — D6481 Anemia due to antineoplastic chemotherapy: Secondary | ICD-10-CM

## 2014-07-14 DIAGNOSIS — R0600 Dyspnea, unspecified: Secondary | ICD-10-CM

## 2014-07-14 DIAGNOSIS — C3490 Malignant neoplasm of unspecified part of unspecified bronchus or lung: Secondary | ICD-10-CM | POA: Insufficient documentation

## 2014-07-14 DIAGNOSIS — D630 Anemia in neoplastic disease: Secondary | ICD-10-CM

## 2014-07-14 DIAGNOSIS — C3411 Malignant neoplasm of upper lobe, right bronchus or lung: Secondary | ICD-10-CM

## 2014-07-14 DIAGNOSIS — C3491 Malignant neoplasm of unspecified part of right bronchus or lung: Secondary | ICD-10-CM

## 2014-07-14 DIAGNOSIS — R0602 Shortness of breath: Secondary | ICD-10-CM

## 2014-07-14 DIAGNOSIS — R911 Solitary pulmonary nodule: Secondary | ICD-10-CM

## 2014-07-14 DIAGNOSIS — R5383 Other fatigue: Secondary | ICD-10-CM

## 2014-07-14 DIAGNOSIS — T451X5A Adverse effect of antineoplastic and immunosuppressive drugs, initial encounter: Secondary | ICD-10-CM

## 2014-07-14 LAB — CBC WITH DIFFERENTIAL/PLATELET
BASO%: 0.5 % (ref 0.0–2.0)
BASOS ABS: 0 10*3/uL (ref 0.0–0.1)
EOS ABS: 0.1 10*3/uL (ref 0.0–0.5)
EOS%: 4.5 % (ref 0.0–7.0)
HCT: 25.4 % — ABNORMAL LOW (ref 38.4–49.9)
HGB: 8 g/dL — ABNORMAL LOW (ref 13.0–17.1)
LYMPH%: 7.8 % — ABNORMAL LOW (ref 14.0–49.0)
MCH: 29.4 pg (ref 27.2–33.4)
MCHC: 31.6 g/dL — ABNORMAL LOW (ref 32.0–36.0)
MCV: 93.1 fL (ref 79.3–98.0)
MONO#: 0.3 10*3/uL (ref 0.1–0.9)
MONO%: 14.7 % — ABNORMAL HIGH (ref 0.0–14.0)
NEUT#: 1.5 10*3/uL (ref 1.5–6.5)
NEUT%: 72.5 % (ref 39.0–75.0)
Platelets: 120 10*3/uL — ABNORMAL LOW (ref 140–400)
RBC: 2.73 10*6/uL — ABNORMAL LOW (ref 4.20–5.82)
RDW: 20.3 % — ABNORMAL HIGH (ref 11.0–14.6)
WBC: 2.1 10*3/uL — ABNORMAL LOW (ref 4.0–10.3)
lymph#: 0.2 10*3/uL — ABNORMAL LOW (ref 0.9–3.3)

## 2014-07-14 LAB — COMPREHENSIVE METABOLIC PANEL (CC13)
ALBUMIN: 3 g/dL — AB (ref 3.5–5.0)
ALK PHOS: 78 U/L (ref 40–150)
ALT: 14 U/L (ref 0–55)
AST: 26 U/L (ref 5–34)
Anion Gap: 12 mEq/L — ABNORMAL HIGH (ref 3–11)
BUN: 15.4 mg/dL (ref 7.0–26.0)
CALCIUM: 8.9 mg/dL (ref 8.4–10.4)
CO2: 22 meq/L (ref 22–29)
CREATININE: 0.8 mg/dL (ref 0.7–1.3)
Chloride: 109 mEq/L (ref 98–109)
EGFR: 87 mL/min/{1.73_m2} — ABNORMAL LOW (ref >90)
Glucose: 121 mg/dl (ref 70–140)
POTASSIUM: 3.9 meq/L (ref 3.5–5.1)
Sodium: 143 mEq/L (ref 136–145)
Total Bilirubin: 0.97 mg/dL (ref 0.20–1.20)
Total Protein: 6.5 g/dL (ref 6.4–8.3)

## 2014-07-14 LAB — TSH CHCC: TSH: 1.697 m(IU)/L (ref 0.320–4.118)

## 2014-07-14 MED ORDER — LEVOFLOXACIN 500 MG PO TABS: 500.0000 mg | ORAL_TABLET | Freq: Every day | ORAL | Status: DC

## 2014-07-14 MED ORDER — IOHEXOL 350 MG/ML SOLN
100.0000 mL | Freq: Once | INTRAVENOUS | Status: AC | PRN
  Administered 2014-07-14: 100 mL via INTRAVENOUS

## 2014-07-14 NOTE — Progress Notes (Addendum)
[place] [phone]   [phone]  OFFICE PROGRESS NOTE  [name],[name] [name], MD [place] [address]  DIAGNOSIS: Non-small cell carcinoma of lung, stage 4  Primary site: Lung (Right)  Staging method: AJCC 7th Edition  Clinical: Stage IV (T1a, N3, M1b) signed by Curt Bears, MD on 02/01/2014 1:42 PM  Summary: Stage IV (T1a, N3, M1b) Prostate cancer  Primary site: Prostate  Clinical: Stage I (T1c, NX, M0) signed by Wyatt Portela, MD on 08/06/2013 1:59 PM  Summary: Stage I (T1c, NX, M0)  PRIOR THERAPY: Systemic chemotherapy with carboplatin for an AUC of 5 and Alimta 500 mg per meter squared given every 3 weeks. Status post 6 cycles, last dose was given 05/20/2014 discontinued secondary to disease progression.  CURRENT THERAPY: Immunotherapy with Keytruda (pembrolizumab) 2 MG/KG every 3 weeks. First dose 07/14/2014. He has positive PDL 1 expression (90%).  INTERVAL HISTORY: Nicholas Schmitt 79 y.o. male returns to the clinic today for follow-up visit accompanied by his wife. The patient completed palliative radiotherapy to the right upper chest lesion in addition to the lumbar spine under the care of Dr. Sondra Come. He presents to proceed with this first cycle of immunotherapy with Keytruda. He complains of increased difficulty breathing. He notes increased shortness breath with his activities of daily living. He states he can only walk higher 6 tabs before having to sit down and rest due to the shortness of breath. He feels as though his notices congested making it more difficult to breathe. He recently saw his pulmonologist, Dr. Melvyn Novas recently who told that he is "lungs were fine". He denied fever but complains of chills. His weight has decreased secondary to his decrease by mouth intake.  He denied having any  nausea or vomiting. The patient denied having any significant chest pain,  cough or hemoptysis.   MEDICAL  HISTORY: Past Medical History  Diagnosis Date  . Hypertension   . CAD (coronary artery disease)     positive stress test in 2006 led to left heart cath (8/06) showing 95% prox RCA, 90% CFX, and 90% mLAD.  patient had a cypher DES to all 3 lesions  . Hyperlipidemia   . Hearing loss   . AAA (abdominal aortic aneurysm) 11/09    3.2 cm   . Chronic renal insufficiency   . History of radiation therapy 03/08/04- 04/08/04    prostate 4600 cGy in 3 fractions, radioactive seed implant 05/04/04  . Myocardial infarction 1995  . Pacemaker 2014  . Dysrhythmia   . Shortness of breath     exertion  . Asthma     bronchial  . COPD (chronic obstructive pulmonary disease)   . Cancer 2015    prostate   . Prostate cancer 2005    gleason 7  . Skin cancer     ear  . Lung cancer     ALLERGIES:  has No Known Allergies.  MEDICATIONS:  Current Outpatient Prescriptions  Medication Sig Dispense Refill  . albuterol (PROVENTIL,VENTOLIN) 90 MCG/ACT inhaler Inhale 2 puffs into the lungs every 6 (six) hours as needed for wheezing or shortness of breath.    . Aspirin-Acetaminophen-Caffeine (GOODY HEADACHE PO) Take 1 packet by mouth daily. As needed    . budesonide-formoterol (SYMBICORT) 160-4.5 MCG/ACT inhaler 1 puff twice daily (Patient taking differently: 2 puffs. 2 puffs twice per day) 4 Inhaler 3  . clopidogrel (PLAVIX) 75 MG tablet Take 1 tablet (75 mg total)  by mouth daily. 226 tablet 3  . folic acid (FOLVITE) 1 MG tablet Take 1 tablet (1 mg total) by mouth daily. 30 tablet 3  . hyaluronate sodium (RADIAPLEXRX) GEL Apply 1 application topically once.    [name] losartan (COZAAR) 50 MG tablet Take 1 tablet (50 mg total) by mouth daily. 90 tablet 3  . Multiple Minerals (CALCIUM/MAGNESIUM/ZINC PO) Take 1 capsule by mouth every evening.    . Multiple Vitamin (MULTIVITAMIN) tablet Take 1 tablet by mouth daily.     . naproxen sodium (ANAPROX) 220 MG tablet Take 440 mg by mouth 2 (two) times daily as needed (pain).      . nitroGLYCERIN (NITROSTAT) 0.4 MG SL tablet Place 0.4 mg under the tongue every 5 (five) minutes as needed for chest pain (MAX 3 TABLETS).    [name] sucralfate (CARAFATE) 1 G tablet Take 1 g by mouth 4 (four) times daily -  with meals and at bedtime. Dissolve tablets in 15-30 ml of water before taking.  Take half hour before meals and at bedtime.  Disp. 42 tablets. 0 refills. Called in to [facility] per Dr. Lisbeth Renshaw.    [name] oxyCODONE (OXY IR/ROXICODONE) 5 MG immediate release tablet Take 1-2 tablets (5-10 mg total) by mouth every 4 (four) hours as needed for moderate pain. (Patient not taking: Reported on 07/08/2014) 30 tablet 0  . prochlorperazine (COMPAZINE) 10 MG tablet Take 1 tablet (10 mg total) by mouth every 6 (six) hours as needed for nausea or vomiting. (Patient not taking: Reported on 07/11/2014) 30 tablet 0  . sucralfate (CARAFATE) 1 GM/10ML suspension Take 10 mLs (1 g total) by mouth 4 (four) times daily -  with meals and at bedtime. (Patient not taking: Reported on 07/11/2014) 420 mL 0   No current facility-administered medications for this visit.    SURGICAL HISTORY:  Past Surgical History  Procedure Laterality Date  . Ptca  2006  . Coronary stent placement    . Permanent pacemaker insertion Bilateral 02/26/13    MDT Adapta L implanted by Dr Rayann Heman for sick sinus syndrome  . Radioactive seed implant  05/04/2004    8000 cGy, Dr Danny Lawless Dr Reece Agar  . Elbow surgery Bilateral 1973  . Insert / replace / remove pacemaker    . Coronary angioplasty    . Mediastinoscopy N/A 01/28/2014    Procedure: MEDIASTINOSCOPY;  Surgeon: Melrose Nakayama, MD;  Location: [facility];  Service: Thoracic;  Laterality: N/A;  . Permanent pacemaker insertion N/A 02/26/2013    Procedure: PERMANENT PACEMAKER INSERTION;  Surgeon: [name], MD;  Location: [facility];  Service: Cardiovascular;  Laterality: N/A;    REVIEW OF SYSTEMS:  Constitutional: positive for fatigue Eyes: negative Ears, nose, mouth,  throat, and face: negative Respiratory: positive for dyspnea on exertion and increased shortness of breath at rest and with minimal exertion Cardiovascular: negative Gastrointestinal: negative Genitourinary:negative Integument/breast: negative Hematologic/lymphatic: negative Musculoskeletal:negative Neurological: negative Behavioral/Psych: negative Endocrine: negative Allergic/Immunologic: negative   PHYSICAL EXAMINATION: General appearance: alert, cooperative, fatigued and no distress Head: Normocephalic, without obvious abnormality, atraumatic Neck: no adenopathy, no carotid bruit, supple, symmetrical, trachea midline and thyroid not enlarged, symmetric, no tenderness/mass/nodules Lymph nodes: Cervical, supraclavicular, and axillary nodes normal. Resp: clear to auscultation bilaterally Back: symmetric, no curvature. ROM normal. No CVA tenderness. Cardio: regular rate and rhythm, S1, S2 normal, no murmur, click, rub or gallop GI: soft, non-tender; bowel sounds normal; no masses,  no organomegaly Extremities: extremities normal, atraumatic, no cyanosis or edema Neurologic: Alert  and oriented X 3, normal strength and tone. Normal symmetric reflexes. Normal coordination and gait  ECOG PERFORMANCE STATUS: 1 - Symptomatic but completely ambulatory  Blood pressure 115/61, pulse 83, temperature 97.8 F (36.6 C), temperature source Oral, resp. rate 20, height _0  (1.803 m), weight 198 lb 9.6 oz (90.084 kg), SpO2 92 %.  LABORATORY DATA: Lab Results  Component Value Date   WBC 2.1* 07/14/2014   HGB 8.0* 07/14/2014   HCT 25.4* 07/14/2014   MCV 93.1 07/14/2014   PLT 120* 07/14/2014      Chemistry      Component Value Date/Time   NA 143 07/14/2014 1422   NA 141 04/15/2014 0755   K 3.9 07/14/2014 1422   K 5.0 04/15/2014 0755   CL 109 04/15/2014 0755   CO2 22 07/14/2014 1422   CO2 26 04/15/2014 0755   BUN 15.4 07/14/2014 1422   BUN 19 04/15/2014 0755   CREATININE 0.8  07/14/2014 1422   CREATININE 0.8 04/15/2014 0755      Component Value Date/Time   CALCIUM 8.9 07/14/2014 1422   CALCIUM 9.2 04/15/2014 0755   ALKPHOS 78 07/14/2014 1422   ALKPHOS 87 04/15/2014 0755   AST 26 07/14/2014 1422   AST 24 04/15/2014 0755   ALT 14 07/14/2014 1422   ALT 24 04/15/2014 0755   BILITOT 0.97 07/14/2014 1422   BILITOT 1.3* 04/15/2014 0755       RADIOGRAPHIC STUDIES: Ct Angio Chest Pe W/cm &/or Wo Cm  07/14/2014   CLINICAL DATA:  Patient with diagnosis of non-small-cell lung cancer, currently undergoing palliative radiation therapy to right upper chest lesion. Now with worsening dyspnea. Evaluate for pulmonary embolism and/or pneumonitis.  EXAM: CT ANGIOGRAPHY CHEST WITH CONTRAST  TECHNIQUE: Multidetector CT imaging of the chest was performed using the standard protocol during bolus administration of intravenous contrast. Multiplanar CT image reconstructions and MIPs were obtained to evaluate the vascular anatomy.  CONTRAST:  181m OMNIPAQUE IOHEXOL 350 MG/ML SOLN  COMPARISON:  06/17/2014; 06/04/2014  FINDINGS: Vascular Findings:  There is suboptimal opacification of the pulmonary arterial system with the main pulmonary artery measuring only 198 Hounsfield units. There are no discrete filling defects within the pulmonary arterial tree to the level of the bilateral subsegmental pulmonary arteries. Evaluation of the distal subsegmental pulmonary arteries is degraded secondary to a combination of suboptimal vessel opacification as well as patient respiratory artifact.  There is grossly unchanged moderate (approximately 50%) narrowing of the distal aspect of the right main pulmonary artery (coronal image 74, series 601) and occlusion of the right upper lobe pulmonary artery (coronal image 72) secondary to grossly unchanged right hilar and mediastinal adenopathy. Normal caliber of the main pulmonary artery.  Normal heart size. Trace amount of pericardial fluid, presumably physiologic.  Coronary artery calcifications. Left anterior chest wall dual lead pacemaker with tips terminating within the right atrium and ventricle.  Scattered atherosclerotic plaque within a normal caliber thoracic aorta. No definite thoracic aortic dissection or periaortic stranding on this non gated examination.  There is unchanged narrowing of the SVC and central aspect of the left innominate vein secondary to grossly unchanged mediastinal adenopathy with associated filling of hypertrophied mediastinal and paraspinal venous collaterals.  Review of the MIP images confirms the above findings.   ----------------------------------------------------------------------------------  Nonvascular Findings:  Evaluation of the pulmonary parenchyma is degraded secondary to patient respiratory artifact.  Previously identified spiculated nodules within right lung apex are unchanged with dominant nodule measuring 1.2 cm in greatest short axis diameter (  image 22, series 8) and additional nodule within the right lung apex measuring approximately 1.0 cm (image 19, series 8).  Consolidative nodal conglomeration involving the right superior hilum is unchanged, measuring 1.9 x 1.9 cm (image 42, series 5). This finding is again associated with narrowing of the adjacent right main pulmonary artery as well as malignant occlusion of the right upper lobe pulmonary artery.  There is grossly unchanged ill-defined soft tissue mass within the anterior mediastinum which results in narrowing of the superior aspect of the SVC (image 34, series 5) as well as the central aspect of the left innominate vein (image 31, series 5) with associated filling of hypertrophied mediastinal and paraspinal venous collaterals.  Dominant pretracheal nodal conglomeration is grossly unchanged, measuring 2.2 cm in diameter (image 34, series 5).  There is a minimal amount of nonocclusive debris within distal aspect of the trachea (image 29, series 8). There is grossly unchanged  narrowing of the right upper lobe bronchus (image 40, series 8) as well as the lateral and posterior basilar segmental bronchi of the right lower lobe (image 53, series 8). The remaining central bronchi appear patent.  Worsening bibasilar dependent ground-glass opacities, right greater than left with relative areas of consolidation within the right lower lobe (image 38 and 50, series 8). Mild diffuse interstitial thickening. No pleural effusion or pneumothorax.  Known metastasis involving the right adrenal gland appears morphologically similar the prior examination though is incompletely imaged (image 90, series 5). Hypoattenuating lesions within the dome of the liver are unchanged and again favored to represent hepatic cysts.  No definite acute or aggressive osseous abnormalities. Regional soft tissues appear otherwise normal.  IMPRESSION: 1. No evidence of pulmonary embolism to the level of the bilateral subsegmental pulmonary arteries. 2. Unchanged moderate (approximately 50%) narrowing of the distal aspect of the right main pulmonary artery and occlusion of the right upper lobe pulmonary artery secondary to malignant right hilar adenopathy. 3. Unchanged narrowing of the superior aspect of the SVC and central aspect of the left innominate vein secondary to grossly unchanged mediastinal adenopathy with associated filling of multiple mediastinal and paraspinal venous collaterals. 4. Worsening bibasilar dependent opacities with geographic areas of confluence within the right lower lung, possibly atelectasis though developing areas of infection (including atypical etiologies) could have a similar appearance. 5. Spiculated nodules within the right lung apex with associated right hilar and mediastinal adenopathy appears similar to the prior examination. 6. Grossly unchanged right adrenal gland metastasis, incompletely imaged.   Electronically Signed   By: Sandi Mariscal M.D.   On: 07/14/2014 17:16   Ct Angio Chest Pe  W/cm &/or Wo Cm  06/17/2014   CLINICAL DATA:  Acute onset of shortness of breath and back pain. New diagnosis of lung cancer.  EXAM: CT ANGIOGRAPHY CHEST WITH CONTRAST  TECHNIQUE: Multidetector CT imaging of the chest was performed using the standard protocol during bolus administration of intravenous contrast. Multiplanar CT image reconstructions and MIPs were obtained to evaluate the vascular anatomy.  CONTRAST:  134m OMNIPAQUE IOHEXOL 350 MG/ML SOLN  COMPARISON:  06/04/2014.  FINDINGS: Chest wall: Stable borderline enlarged right axillary lymph nodes. No supraclavicular adenopathy. The thyroid gland is grossly normal. The bony thorax is intact. No destructive bone lesions or spinal canal compromise. Stable degenerative changes.  Mediastinum: The heart is normal in size. No pericardial effusion. Stable advanced atherosclerotic calcifications involving the aorta and branch vessels including the coronary arteries. Stable mediastinal and right hilar lymphadenopathy with mild compression of the SVC.  The esophagus is grossly normal.  The pulmonary arterial tree is well opacified. No filling defects to suggest pulmonary embolism. There is significant compression of the right pulmonary artery in the right hilum and the upper lobe pulmonary artery is occluded. This is chronic.  Lungs: Stable spiculated right upper lobe lung lesions and stable emphysematous changes and pulmonary scarring. No pleural effusion.  Upper abdomen: Stable hepatic cysts. Stable right adrenal metastasis. Stable upper pole left renal cysts.  Review of the MIP images confirms the above findings.  IMPRESSION: 1. No CT findings for pulmonary embolism. 2. Severe compression of the right pulmonary artery in the right hilum. The right upper lobe pulmonary artery is occluded by surrounding tumor. There is also compression of the SVC. 3. Stable spiculated lung lesions in the right upper lobe along with right hilar and mediastinal lymphadenopathy. 4. No  acute pulmonary findings. Chronic emphysematous changes and pulmonary scarring with scattered small pulmonary nodules. 5. Stable right adrenal gland metastasis.   Electronically Signed   By: Kalman Jewels M.D.   On: 06/17/2014 15:44    ASSESSMENT AND PLAN: this is a very pleasant 79 years old white male with stage IV non-small cell lung cancer, adenocarcinoma with positive PDL 1 expression, completed systemic chemotherapy with carboplatin and Alimta status post 6 cycles and tolerated his treatment fairly well.  The recent CT scan of the chest, abdomen and pelvis showed evidence for disease progression with new spiculated left upper lobe nodule in addition to increased in the size of the right adrenal gland. The patient was scheduled to begin his first cycle of Keytruda at 2 mg/kg given every 3 weeks today however, in view of his complaints of increased shortness of breath we will postpone his first cycle of Keytruda by 1 week. Patient was discussed with and also seen by Dr. Julien Nordmann. We will obtain a stat CT angiogram of the chest looking for possible pulmonary embolism versus radiation pneumonitis. The CT angiogram was negative for pulmonary embolism. There was some question of developing areas of infection. Patient will be placed on a seven-day course of Levaquin at 500 mg by mouth daily for the next 7 days. He'll return in one week for his first cycle of Keytruda and in 2 weeks for symptom management visit with repeat CBC differential and C met. The patient was advised to call immediately if he has any concerning symptoms in the interval. The patient voices understanding of current disease status and treatment options and is in agreement with the current care plan.  All questions were answered. The patient knows to call the clinic with any problems, questions or concerns. We can certainly see the patient much sooner if necessary.  Carlton Adam PA-C  ADDENDUM: Hematology/Oncology Attending: I  had a face to face encounter with the patient. I recommended his care plan. This is a very pleasant 79 years old white male with metastatic non-small cell lung cancer status post induction chemotherapy with carboplatin and Alimta with initial response followed by disease progression. He was supposed to start the first cycle of his treatment with [place] (pembrolizumab) today. Unfortunately the patient presented with significant shortness of breath and fatigue . We ordered stat CT angiogram of the chest. It showed no significant abnormalities except for questionable inflammatory process in the lower lung field. There was no evidence for pulmonary embolism or disease progression. I will start the patient on treatment with Levaquin 500 mg by mouth daily for the next 7 days. We will delay  the start of the first cycle of his immunotherapy by 1 week. For the neoplasm-induced anemia, will arrange for the patient to receive 2 units of PRBCs transfusion  He would come back for follow-up visit in one week for reevaluation before starting the first cycle of his immunotherapy. He was advised to call immediately if he has any concerning symptoms in the interval.   Disclaimer: This note was dictated with voice recognition software. Similar sounding words can inadvertently be transcribed and may not be corrected upon review. Eilleen Kempf., MD 07/15/2014

## 2014-07-14 NOTE — Progress Notes (Signed)
[facility] called on pt for 2 units of blood later this week.

## 2014-07-14 NOTE — Telephone Encounter (Signed)
gv pt appt schedule for feb. due to 2/1 tx delayed until 2/8 - 2/22 tx moved to 2/29.

## 2014-07-15 NOTE — Patient Instructions (Signed)
Take the antibiotic as prescribed Return in one week to begin immunotherapy with Keytruda Follow-up in 2 weeks for symptom management visit

## 2014-07-16 ENCOUNTER — Other Ambulatory Visit: Payer: Self-pay

## 2014-07-16 ENCOUNTER — Other Ambulatory Visit: Payer: BLUE CROSS/BLUE SHIELD

## 2014-07-16 DIAGNOSIS — C3491 Malignant neoplasm of unspecified part of right bronchus or lung: Secondary | ICD-10-CM | POA: Diagnosis not present

## 2014-07-16 DIAGNOSIS — D630 Anemia in neoplastic disease: Secondary | ICD-10-CM | POA: Diagnosis not present

## 2014-07-18 ENCOUNTER — Ambulatory Visit (HOSPITAL_BASED_OUTPATIENT_CLINIC_OR_DEPARTMENT_OTHER): Payer: Medicare Other

## 2014-07-18 VITALS — BP 135/71 | HR 74 | Temp 98.4°F | Resp 18

## 2014-07-18 DIAGNOSIS — T451X5A Adverse effect of antineoplastic and immunosuppressive drugs, initial encounter: Principal | ICD-10-CM

## 2014-07-18 DIAGNOSIS — D6481 Anemia due to antineoplastic chemotherapy: Secondary | ICD-10-CM

## 2014-07-18 LAB — PREPARE RBC (CROSSMATCH)

## 2014-07-18 MED ORDER — HEPARIN SOD (PORK) LOCK FLUSH 100 UNIT/ML IV SOLN
500.0000 [IU] | Freq: Every day | INTRAVENOUS | Status: DC | PRN
Start: 2014-07-18 — End: 2015-05-21
  Filled 2014-07-18: qty 5

## 2014-07-18 MED ORDER — ACETAMINOPHEN 325 MG PO TABS
ORAL_TABLET | ORAL | Status: AC
  Filled 2014-07-18: qty 2

## 2014-07-18 MED ORDER — DIPHENHYDRAMINE HCL 25 MG PO CAPS
ORAL_CAPSULE | ORAL | Status: AC
  Filled 2014-07-18: qty 1

## 2014-07-18 MED ORDER — DIPHENHYDRAMINE HCL 25 MG PO CAPS
25.0000 mg | ORAL_CAPSULE | Freq: Once | ORAL | Status: AC
  Administered 2014-07-18: 25 mg via ORAL

## 2014-07-18 MED ORDER — SODIUM CHLORIDE 0.9 % IV SOLN
250.0000 mL | Freq: Once | INTRAVENOUS | Status: AC
  Administered 2014-07-18: 250 mL via INTRAVENOUS

## 2014-07-18 MED ORDER — ACETAMINOPHEN 325 MG PO TABS
650.0000 mg | ORAL_TABLET | Freq: Once | ORAL | Status: AC
  Administered 2014-07-18: 650 mg via ORAL

## 2014-07-18 MED ORDER — SODIUM CHLORIDE 0.9 % IJ SOLN
3.0000 mL | INTRAMUSCULAR | Status: DC | PRN
  Filled 2014-07-18: qty 10

## 2014-07-18 MED ORDER — SODIUM CHLORIDE 0.9 % IJ SOLN
10.0000 mL | INTRAMUSCULAR | Status: DC | PRN
  Filled 2014-07-18: qty 10

## 2014-07-18 MED ORDER — HEPARIN SOD (PORK) LOCK FLUSH 100 UNIT/ML IV SOLN
250.0000 [IU] | INTRAVENOUS | Status: AC | PRN
  Administered 2014-07-18: 250 [IU]
  Filled 2014-07-18: qty 5

## 2014-07-18 NOTE — Patient Instructions (Signed)

## 2014-07-19 LAB — TYPE AND SCREEN
ABO/RH(D): O POS
ANTIBODY SCREEN: NEGATIVE
Unit division: 0
Unit division: 0

## 2014-07-21 ENCOUNTER — Other Ambulatory Visit: Payer: Self-pay

## 2014-07-21 ENCOUNTER — Other Ambulatory Visit (HOSPITAL_BASED_OUTPATIENT_CLINIC_OR_DEPARTMENT_OTHER): Payer: Medicare Other

## 2014-07-21 ENCOUNTER — Inpatient Hospital Stay (HOSPITAL_COMMUNITY)
Admission: EM | Admit: 2014-07-21 | Discharge: 2014-07-25 | DRG: 193 | Disposition: A | Discharge status: Home Health | Payer: Medicare Other | Attending: Internal Medicine | Admitting: Internal Medicine

## 2014-07-21 ENCOUNTER — Telehealth: Payer: Self-pay | Admitting: Nurse Practitioner

## 2014-07-21 ENCOUNTER — Encounter (HOSPITAL_COMMUNITY): Payer: Self-pay

## 2014-07-21 ENCOUNTER — Other Ambulatory Visit: Payer: Self-pay | Admitting: *Deleted

## 2014-07-21 ENCOUNTER — Telehealth: Payer: Self-pay | Admitting: *Deleted

## 2014-07-21 ENCOUNTER — Encounter: Payer: Self-pay | Admitting: Nurse Practitioner

## 2014-07-21 ENCOUNTER — Emergency Department (HOSPITAL_COMMUNITY): Payer: Medicare Other

## 2014-07-21 ENCOUNTER — Ambulatory Visit: Payer: Medicare Other

## 2014-07-21 ENCOUNTER — Ambulatory Visit (HOSPITAL_BASED_OUTPATIENT_CLINIC_OR_DEPARTMENT_OTHER): Payer: Medicare Other | Admitting: Nurse Practitioner

## 2014-07-21 VITALS — BP 112/72 | HR 93 | Temp 98.2°F | Resp 22 | Ht 71.0 in | Wt 197.0 lb

## 2014-07-21 DIAGNOSIS — C799 Secondary malignant neoplasm of unspecified site: Secondary | ICD-10-CM | POA: Diagnosis present

## 2014-07-21 DIAGNOSIS — Z79891 Long term (current) use of opiate analgesic: Secondary | ICD-10-CM

## 2014-07-21 DIAGNOSIS — C3490 Malignant neoplasm of unspecified part of unspecified bronchus or lung: Secondary | ICD-10-CM

## 2014-07-21 DIAGNOSIS — H9190 Unspecified hearing loss, unspecified ear: Secondary | ICD-10-CM | POA: Diagnosis present

## 2014-07-21 DIAGNOSIS — J45909 Unspecified asthma, uncomplicated: Secondary | ICD-10-CM | POA: Diagnosis present

## 2014-07-21 DIAGNOSIS — J189 Pneumonia, unspecified organism: Principal | ICD-10-CM | POA: Diagnosis present

## 2014-07-21 DIAGNOSIS — Z923 Personal history of irradiation: Secondary | ICD-10-CM

## 2014-07-21 DIAGNOSIS — Z825 Family history of asthma and other chronic lower respiratory diseases: Secondary | ICD-10-CM | POA: Diagnosis not present

## 2014-07-21 DIAGNOSIS — Z8249 Family history of ischemic heart disease and other diseases of the circulatory system: Secondary | ICD-10-CM | POA: Diagnosis not present

## 2014-07-21 DIAGNOSIS — R0600 Dyspnea, unspecified: Secondary | ICD-10-CM

## 2014-07-21 DIAGNOSIS — Z87891 Personal history of nicotine dependence: Secondary | ICD-10-CM | POA: Diagnosis not present

## 2014-07-21 DIAGNOSIS — D849 Immunodeficiency, unspecified: Secondary | ICD-10-CM

## 2014-07-21 DIAGNOSIS — N189 Chronic kidney disease, unspecified: Secondary | ICD-10-CM | POA: Diagnosis present

## 2014-07-21 DIAGNOSIS — R0602 Shortness of breath: Secondary | ICD-10-CM | POA: Diagnosis not present

## 2014-07-21 DIAGNOSIS — Z7982 Long term (current) use of aspirin: Secondary | ICD-10-CM | POA: Diagnosis not present

## 2014-07-21 DIAGNOSIS — D899 Disorder involving the immune mechanism, unspecified: Secondary | ICD-10-CM | POA: Diagnosis present

## 2014-07-21 DIAGNOSIS — T451X5A Adverse effect of antineoplastic and immunosuppressive drugs, initial encounter: Secondary | ICD-10-CM | POA: Diagnosis present

## 2014-07-21 DIAGNOSIS — D61818 Other pancytopenia: Secondary | ICD-10-CM | POA: Insufficient documentation

## 2014-07-21 DIAGNOSIS — Z79899 Other long term (current) drug therapy: Secondary | ICD-10-CM | POA: Diagnosis not present

## 2014-07-21 DIAGNOSIS — I495 Sick sinus syndrome: Secondary | ICD-10-CM | POA: Diagnosis present

## 2014-07-21 DIAGNOSIS — D61810 Antineoplastic chemotherapy induced pancytopenia: Secondary | ICD-10-CM | POA: Diagnosis present

## 2014-07-21 DIAGNOSIS — I714 Abdominal aortic aneurysm, without rupture, unspecified: Secondary | ICD-10-CM | POA: Diagnosis present

## 2014-07-21 DIAGNOSIS — Y95 Nosocomial condition: Secondary | ICD-10-CM | POA: Diagnosis present

## 2014-07-21 DIAGNOSIS — C61 Malignant neoplasm of prostate: Secondary | ICD-10-CM | POA: Diagnosis present

## 2014-07-21 DIAGNOSIS — Z950 Presence of cardiac pacemaker: Secondary | ICD-10-CM | POA: Diagnosis not present

## 2014-07-21 DIAGNOSIS — I129 Hypertensive chronic kidney disease with stage 1 through stage 4 chronic kidney disease, or unspecified chronic kidney disease: Secondary | ICD-10-CM | POA: Diagnosis present

## 2014-07-21 DIAGNOSIS — Z952 Presence of prosthetic heart valve: Secondary | ICD-10-CM

## 2014-07-21 DIAGNOSIS — E785 Hyperlipidemia, unspecified: Secondary | ICD-10-CM | POA: Diagnosis present

## 2014-07-21 DIAGNOSIS — I10 Essential (primary) hypertension: Secondary | ICD-10-CM | POA: Diagnosis present

## 2014-07-21 DIAGNOSIS — J449 Chronic obstructive pulmonary disease, unspecified: Secondary | ICD-10-CM | POA: Diagnosis present

## 2014-07-21 DIAGNOSIS — C3491 Malignant neoplasm of unspecified part of right bronchus or lung: Secondary | ICD-10-CM

## 2014-07-21 DIAGNOSIS — J9601 Acute respiratory failure with hypoxia: Secondary | ICD-10-CM | POA: Diagnosis present

## 2014-07-21 DIAGNOSIS — I252 Old myocardial infarction: Secondary | ICD-10-CM

## 2014-07-21 DIAGNOSIS — Z955 Presence of coronary angioplasty implant and graft: Secondary | ICD-10-CM

## 2014-07-21 DIAGNOSIS — C4491 Basal cell carcinoma of skin, unspecified: Secondary | ICD-10-CM | POA: Diagnosis present

## 2014-07-21 DIAGNOSIS — Z7902 Long term (current) use of antithrombotics/antiplatelets: Secondary | ICD-10-CM | POA: Diagnosis not present

## 2014-07-21 DIAGNOSIS — D630 Anemia in neoplastic disease: Secondary | ICD-10-CM | POA: Diagnosis present

## 2014-07-21 DIAGNOSIS — I2510 Atherosclerotic heart disease of native coronary artery without angina pectoris: Secondary | ICD-10-CM | POA: Diagnosis present

## 2014-07-21 DIAGNOSIS — C44219 Basal cell carcinoma of skin of left ear and external auricular canal: Secondary | ICD-10-CM | POA: Diagnosis present

## 2014-07-21 LAB — URINALYSIS, ROUTINE W REFLEX MICROSCOPIC
Bilirubin Urine: NEGATIVE
GLUCOSE, UA: NEGATIVE mg/dL
Hgb urine dipstick: NEGATIVE
Ketones, ur: NEGATIVE mg/dL
Leukocytes, UA: NEGATIVE
Nitrite: NEGATIVE
Protein, ur: NEGATIVE mg/dL
SPECIFIC GRAVITY, URINE: 1.021 (ref 1.005–1.030)
UROBILINOGEN UA: 1 mg/dL (ref 0.0–1.0)
pH: 5.5 (ref 5.0–8.0)

## 2014-07-21 LAB — CBC WITH DIFFERENTIAL/PLATELET
BASO%: 0.7 % (ref 0.0–2.0)
Basophils Absolute: 0 10*3/uL (ref 0.0–0.1)
Basophils Absolute: 0 10*3/uL (ref 0.0–0.1)
Basophils Relative: 0 % (ref 0–1)
EOS ABS: 0.1 10*3/uL (ref 0.0–0.7)
EOS%: 4.7 % (ref 0.0–7.0)
Eosinophils Absolute: 0.1 10*3/uL (ref 0.0–0.5)
Eosinophils Relative: 5 % (ref 0–5)
HCT: 28.9 % — ABNORMAL LOW (ref 39.0–52.0)
HEMATOCRIT: 29.6 % — AB (ref 38.4–49.9)
HEMOGLOBIN: 9.4 g/dL — AB (ref 13.0–17.0)
HGB: 9.4 g/dL — ABNORMAL LOW (ref 13.0–17.1)
LYMPH%: 8.1 % — AB (ref 14.0–49.0)
LYMPHS PCT: 9 % — AB (ref 12–46)
Lymphs Abs: 0.2 10*3/uL — ABNORMAL LOW (ref 0.7–4.0)
MCH: 28.5 pg (ref 27.2–33.4)
MCH: 29.7 pg (ref 26.0–34.0)
MCHC: 31.7 g/dL — ABNORMAL LOW (ref 32.0–36.0)
MCHC: 32.5 g/dL (ref 30.0–36.0)
MCV: 89.9 fL (ref 79.3–98.0)
MCV: 91.5 fL (ref 78.0–100.0)
MONO ABS: 0.4 10*3/uL (ref 0.1–1.0)
MONO#: 0.3 10*3/uL (ref 0.1–0.9)
MONO%: 13.6 % (ref 0.0–14.0)
Monocytes Relative: 16 % — ABNORMAL HIGH (ref 3–12)
NEUT#: 1.6 10*3/uL (ref 1.5–6.5)
NEUT%: 72.9 % (ref 39.0–75.0)
Neutro Abs: 1.6 10*3/uL — ABNORMAL LOW (ref 1.7–7.7)
Neutrophils Relative %: 70 % (ref 43–77)
Platelets: 121 10*3/uL — ABNORMAL LOW (ref 140–400)
Platelets: 110 10*3/uL — ABNORMAL LOW (ref 150–400)
RBC: 3.3 10*6/uL — AB (ref 4.20–5.82)
RBC: 3.16 MIL/uL — AB (ref 4.22–5.81)
RDW: 20.1 % — ABNORMAL HIGH (ref 11.0–14.6)
RDW: 18.6 % — AB (ref 11.5–15.5)
WBC: 2.2 10*3/uL — ABNORMAL LOW (ref 4.0–10.3)
WBC: 2.3 10*3/uL — ABNORMAL LOW (ref 4.0–10.5)
lymph#: 0.2 10*3/uL — ABNORMAL LOW (ref 0.9–3.3)

## 2014-07-21 LAB — COMPREHENSIVE METABOLIC PANEL
ALT: 37 U/L (ref 0–53)
AST: 43 U/L — ABNORMAL HIGH (ref 0–37)
Albumin: 3 g/dL — ABNORMAL LOW (ref 3.5–5.2)
Alkaline Phosphatase: 66 U/L (ref 39–117)
Anion gap: 9 (ref 5–15)
BUN: 15 mg/dL (ref 6–23)
CO2: 22 mmol/L (ref 19–32)
Calcium: 8.9 mg/dL (ref 8.4–10.5)
Chloride: 104 mmol/L (ref 96–112)
Creatinine, Ser: 0.74 mg/dL (ref 0.50–1.35)
GFR calc non Af Amer: 87 mL/min — ABNORMAL LOW (ref >90)
Glucose, Bld: 104 mg/dL — ABNORMAL HIGH (ref 70–99)
Potassium: 3.8 mmol/L (ref 3.5–5.1)
Sodium: 135 mmol/L (ref 135–145)
Total Bilirubin: 1.2 mg/dL (ref 0.3–1.2)
Total Protein: 6.8 g/dL (ref 6.0–8.3)

## 2014-07-21 LAB — COMPREHENSIVE METABOLIC PANEL (CC13)
ALBUMIN: 2.6 g/dL — AB (ref 3.5–5.0)
ALK PHOS: 73 U/L (ref 40–150)
ALT: 37 U/L (ref 0–55)
ANION GAP: 13 meq/L — AB (ref 3–11)
AST: 40 U/L — AB (ref 5–34)
BUN: 14.1 mg/dL (ref 7.0–26.0)
CO2: 19 mEq/L — ABNORMAL LOW (ref 22–29)
Calcium: 9.2 mg/dL (ref 8.4–10.4)
Chloride: 106 mEq/L (ref 98–109)
Creatinine: 0.8 mg/dL (ref 0.7–1.3)
EGFR: 88 mL/min/{1.73_m2} — ABNORMAL LOW (ref >90)
GLUCOSE: 118 mg/dL (ref 70–140)
Potassium: 3.8 mEq/L (ref 3.5–5.1)
Sodium: 138 mEq/L (ref 136–145)
Total Bilirubin: 0.96 mg/dL (ref 0.20–1.20)
Total Protein: 6.5 g/dL (ref 6.4–8.3)

## 2014-07-21 LAB — LACTIC ACID, PLASMA: Lactic Acid, Venous: 1.2 mmol/L (ref 0.5–2.0)

## 2014-07-21 LAB — TROPONIN I

## 2014-07-21 LAB — HOLD TUBE, BLOOD BANK

## 2014-07-21 LAB — BRAIN NATRIURETIC PEPTIDE: B NATRIURETIC PEPTIDE 5: 39.7 pg/mL (ref 0.0–100.0)

## 2014-07-21 MED ORDER — DEXTROSE 5 % IV SOLN
1.0000 g | Freq: Once | INTRAVENOUS | Status: AC
  Administered 2014-07-21: 1 g via INTRAVENOUS
  Filled 2014-07-21: qty 10

## 2014-07-21 MED ORDER — SODIUM CHLORIDE 0.9 % IV SOLN
1250.0000 mg | Freq: Two times a day (BID) | INTRAVENOUS | Status: DC
  Administered 2014-07-21 – 2014-07-24 (×6): 1250 mg via INTRAVENOUS
  Filled 2014-07-21 (×8): qty 1250

## 2014-07-21 MED ORDER — FOLIC ACID 1 MG PO TABS
1.0000 mg | ORAL_TABLET | Freq: Every day | ORAL | Status: DC
  Administered 2014-07-22 – 2014-07-25 (×4): 1 mg via ORAL
  Filled 2014-07-21 (×4): qty 1

## 2014-07-21 MED ORDER — NAPROXEN SODIUM 275 MG PO TABS
275.0000 mg | ORAL_TABLET | Freq: Two times a day (BID) | ORAL | Status: DC | PRN
  Filled 2014-07-21: qty 1

## 2014-07-21 MED ORDER — RADIAPLEXRX EX GEL
1.0000 "application " | Freq: Once | CUTANEOUS | Status: DC
  Filled 2014-07-21: qty 170

## 2014-07-21 MED ORDER — CALCIUM-MAGNESIUM-ZINC 333.33-133.33-5 MG PO TABS: 1.0000 | ORAL_TABLET | Freq: Every day | ORAL | Status: DC

## 2014-07-21 MED ORDER — ALBUTEROL 90 MCG/ACT IN AERS: 2.0000 | INHALATION_SPRAY | Freq: Four times a day (QID) | RESPIRATORY_TRACT | Status: DC | PRN

## 2014-07-21 MED ORDER — HEPARIN SODIUM (PORCINE) 5000 UNIT/ML IJ SOLN
5000.0000 [IU] | Freq: Three times a day (TID) | INTRAMUSCULAR | Status: DC
  Administered 2014-07-21 – 2014-07-25 (×11): 5000 [IU] via SUBCUTANEOUS
  Filled 2014-07-21 (×14): qty 1

## 2014-07-21 MED ORDER — SUCRALFATE 1 G PO TABS
1.0000 g | ORAL_TABLET | Freq: Three times a day (TID) | ORAL | Status: DC
  Administered 2014-07-22 – 2014-07-25 (×12): 1 g via ORAL
  Filled 2014-07-21 (×17): qty 1

## 2014-07-21 MED ORDER — ADULT MULTIVITAMIN W/MINERALS CH
1.0000 | ORAL_TABLET | Freq: Every day | ORAL | Status: DC
  Administered 2014-07-22 – 2014-07-25 (×4): 1 via ORAL
  Filled 2014-07-21 (×4): qty 1

## 2014-07-21 MED ORDER — DM-GUAIFENESIN ER 30-600 MG PO TB12
1.0000 | ORAL_TABLET | Freq: Two times a day (BID) | ORAL | Status: DC
  Administered 2014-07-21 – 2014-07-25 (×8): 1 via ORAL
  Filled 2014-07-21 (×9): qty 1

## 2014-07-21 MED ORDER — ALBUTEROL SULFATE (2.5 MG/3ML) 0.083% IN NEBU
2.5000 mg | INHALATION_SOLUTION | Freq: Four times a day (QID) | RESPIRATORY_TRACT | Status: DC | PRN
  Administered 2014-07-21 – 2014-07-22 (×2): 2.5 mg via RESPIRATORY_TRACT
  Filled 2014-07-21 (×3): qty 3

## 2014-07-21 MED ORDER — OXYCODONE HCL 5 MG PO TABS
5.0000 mg | ORAL_TABLET | ORAL | Status: DC | PRN
  Administered 2014-07-22 – 2014-07-23 (×2): 10 mg via ORAL
  Filled 2014-07-21 (×2): qty 2

## 2014-07-21 MED ORDER — NITROGLYCERIN 0.4 MG SL SUBL: 0.4000 mg | SUBLINGUAL_TABLET | SUBLINGUAL | Status: DC | PRN

## 2014-07-21 MED ORDER — DEXTROSE 5 % IV SOLN: 500.0000 mg | Freq: Once | INTRAVENOUS | Status: DC

## 2014-07-21 MED ORDER — CLOPIDOGREL BISULFATE 75 MG PO TABS
75.0000 mg | ORAL_TABLET | Freq: Every day | ORAL | Status: DC
  Administered 2014-07-22 – 2014-07-25 (×4): 75 mg via ORAL
  Filled 2014-07-21 (×4): qty 1

## 2014-07-21 MED ORDER — DEXTROSE 5 % IV SOLN
1.0000 g | Freq: Three times a day (TID) | INTRAVENOUS | Status: DC
  Administered 2014-07-21 – 2014-07-22 (×2): 1 g via INTRAVENOUS
  Filled 2014-07-21 (×4): qty 1

## 2014-07-21 MED ORDER — PROCHLORPERAZINE MALEATE 10 MG PO TABS: 10.0000 mg | ORAL_TABLET | Freq: Four times a day (QID) | ORAL | Status: DC | PRN

## 2014-07-21 MED ORDER — SODIUM CHLORIDE 0.9 % IV BOLUS (SEPSIS)
500.0000 mL | Freq: Once | INTRAVENOUS | Status: AC
  Administered 2014-07-21: 500 mL via INTRAVENOUS

## 2014-07-21 MED ORDER — NAPROXEN SODIUM 275 MG PO TABS
440.0000 mg | ORAL_TABLET | Freq: Two times a day (BID) | ORAL | Status: DC | PRN
  Filled 2014-07-21: qty 2

## 2014-07-21 MED ORDER — LOSARTAN POTASSIUM 25 MG PO TABS
25.0000 mg | ORAL_TABLET | Freq: Every day | ORAL | Status: DC
  Administered 2014-07-22 – 2014-07-25 (×4): 25 mg via ORAL
  Filled 2014-07-21 (×4): qty 1

## 2014-07-21 MED ORDER — BUDESONIDE-FORMOTEROL FUMARATE 160-4.5 MCG/ACT IN AERO
2.0000 | INHALATION_SPRAY | Freq: Two times a day (BID) | RESPIRATORY_TRACT | Status: DC
  Administered 2014-07-21 – 2014-07-25 (×8): 2 via RESPIRATORY_TRACT
  Filled 2014-07-21: qty 6

## 2014-07-21 NOTE — Telephone Encounter (Signed)
Pt called stating that he is having a lot of SOB and wonders if he needs more blood.  He states he cannot walk very far and gets winded.  He will be coming in for appts today.  Advised that we will add a type and hold and also add an appt for our NP to see him in infusion.  Pt verbalized understanding.

## 2014-07-21 NOTE — ED Notes (Signed)
Bed: FF63 Expected date:  Expected time:  Means of arrival:  Comments: Cancer center SOB

## 2014-07-21 NOTE — H&P (Addendum)
[facility] History and Physical  LUISFERNANDO BRIGHTWELL VOH:[id number] DOB: 07-25-1935 DOA: 07/21/2014  Referring physician: ED physician PCP: [name], MD  Specialists:   Chief Complaint: worsening SOB  HPI: Nicholas Schmitt is a 79 y.o. male with past medical history of coronary artery disease (post status of stent placement), pacemaker placement, hypertension, AAA (3.2 cm x 3.4 cm by ultrasound on 04/09/14), chronic kidney disease, history of prostate cancer (post status of radiation therapy 2005), asthma, COPD, skin cancer, lung cancer, who presents with worsening shortness breath.  Patient reports that he has been having shortness of breath in the past 4 months. The shortness of breath has been progressively getting worse in the past 2 weeks. He has cough with white sputum production. No fever, but has chills. Patient does not have chest pain. Patient uses inhalers at home without significant help. Patient does not have pain over the calf areas bilaterally. Patient denies fever, headaches, chest pain, abdominal pain, diarrhea, constipation, dysuria, urgency, frequency, hematuria, skin rashes or leg swelling.  Work up in the ED demonstrates WBC 2.3, negative troponin, BNP 39.7, temperature normal, electrolytes normal. Chest x-ray showed right greater than left bibasilar streaky airspace process concerning for superimposed pneumonia per radiologist.  Review of Systems: As presented in the history of presenting illness, rest negative.  Where does patient live?  At home Can patient participate in ADLs? barely  Allergy: No Known Allergies  Past Medical History  Diagnosis Date  . Hypertension   . CAD (coronary artery disease)     positive stress test in 2006 led to left heart cath (8/06) showing 95% prox RCA, 90% CFX, and 90% mLAD.  patient had a cypher DES to all 3 lesions  . Hyperlipidemia   . Hearing loss   . AAA (abdominal aortic aneurysm) 11/09    3.2 cm   . Chronic  renal insufficiency   . History of radiation therapy 03/08/04- 04/08/04    prostate 4600 cGy in 3 fractions, radioactive seed implant 05/04/04  . Myocardial infarction 1995  . Pacemaker 2014  . Dysrhythmia   . Shortness of breath     exertion  . Asthma     bronchial  . COPD (chronic obstructive pulmonary disease)   . Cancer 2015    prostate   . Prostate cancer 2005    gleason 7  . Skin cancer     ear  . Lung cancer   . Lung cancer     Past Surgical History  Procedure Laterality Date  . Ptca  2006  . Coronary stent placement    . Permanent pacemaker insertion Bilateral 02/26/13    MDT Adapta L implanted by Dr Rayann Heman for sick sinus syndrome  . Radioactive seed implant  05/04/2004    8000 cGy, Dr Danny Lawless Dr Reece Agar  . Elbow surgery Bilateral 1973  . Insert / replace / remove pacemaker    . Coronary angioplasty    . Mediastinoscopy N/A 01/28/2014    Procedure: MEDIASTINOSCOPY;  Surgeon: Melrose Nakayama, MD;  Location: [facility];  Service: Thoracic;  Laterality: N/A;  . Permanent pacemaker insertion N/A 02/26/2013    Procedure: PERMANENT PACEMAKER INSERTION;  Surgeon: [name], MD;  Location: [facility] C[facility];  Service: Cardiovascular;  Laterality: N/A;    Social History:  reports that he quit smoking about 20 years ago. His smoking use included Cigarettes. He has a 105 pack-year smoking history. He has never used smokeless tobacco. He reports that he does not drink  alcohol or use illicit drugs.  Family History:  Family History  Problem Relation Age of Onset  . Asthma Sister   . Asthma Brother   . Coronary artery disease Other      Prior to Admission medications   Medication Sig Start Date End Date Taking? Authorizing Provider  albuterol (PROVENTIL,VENTOLIN) 90 MCG/ACT inhaler Inhale 2 puffs into the lungs every 6 (six) hours as needed for wheezing or shortness of breath. 03/21/11  Yes Dorena Cookey, MD  Aspirin-Acetaminophen-Caffeine (GOODY HEADACHE PO) Take 1 packet  by mouth daily. As needed   Yes Historical Provider, MD  budesonide-formoterol (SYMBICORT) 160-4.5 MCG/ACT inhaler 1 puff twice daily Patient taking differently: 2 puffs. 2 puffs twice per day 04/29/14  Yes Dorena Cookey, MD  clopidogrel (PLAVIX) 75 MG tablet Take 1 tablet (75 mg total) by mouth daily. 04/29/14  Yes Dorena Cookey, MD  folic acid (FOLVITE) 1 MG tablet Take 1 tablet (1 mg total) by mouth daily. 01/30/14  Yes Curt Bears, MD  hyaluronate sodium (RADIAPLEXRX) GEL Apply 1 application topically once.   Yes Historical Provider, MD  Multiple Minerals (CALCIUM/MAGNESIUM/ZINC PO) Take 1 capsule by mouth every evening.   Yes Historical Provider, MD  Multiple Vitamin (MULTIVITAMIN) tablet Take 1 tablet by mouth daily.    Yes Historical Provider, MD  naproxen sodium (ANAPROX) 220 MG tablet Take 440 mg by mouth 2 (two) times daily as needed (pain).    Yes Historical Provider, MD  nitroGLYCERIN (NITROSTAT) 0.4 MG SL tablet Place 0.4 mg under the tongue every 5 (five) minutes as needed for chest pain (MAX 3 TABLETS). 06/05/13  Yes Dorena Cookey, MD  oxyCODONE (OXY IR/ROXICODONE) 5 MG immediate release tablet Take 1-2 tablets (5-10 mg total) by mouth every 4 (four) hours as needed for moderate pain. 05/29/14  Yes Curt Bears, MD  prochlorperazine (COMPAZINE) 10 MG tablet Take 1 tablet (10 mg total) by mouth every 6 (six) hours as needed for nausea or vomiting. 01/30/14  Yes Curt Bears, MD  levofloxacin (LEVAQUIN) 500 MG tablet Take 1 tablet (500 mg total) by mouth daily. Patient not taking: Reported on 07/21/2014 07/14/14   Carlton Adam, PA-C  losartan (COZAAR) 50 MG tablet Take 1 tablet (50 mg total) by mouth daily. Patient not taking: Reported on 07/21/2014 04/29/14   Dorena Cookey, MD  sucralfate (CARAFATE) 1 G tablet Take 1 g by mouth 4 (four) times daily -  with meals and at bedtime. Dissolve tablets in 15-30 ml of water before taking.  Take half hour before meals and at bedtime.   Disp. 42 tablets. 0 refills. Called in to [facility] per Dr. Lisbeth Renshaw. 07/11/14   Jodelle Gross, MD  sucralfate (CARAFATE) 1 GM/10ML suspension Take 10 mLs (1 g total) by mouth 4 (four) times daily -  with meals and at bedtime. Patient not taking: Reported on 07/21/2014 07/08/14   Blair Promise, MD    Physical Exam: Filed Vitals:   07/21/14 1700 07/21/14 1730 07/21/14 1800 07/21/14 1900  BP: 112/71 137/75 135/74 119/75  Pulse: 80 86 87 82  Temp:      Resp: 23 32 24 29  SpO2: 93% 99% 92% 97%   General: Not in acute distress HEENT:       Eyes: PERRL, EOMI, no scleral icterus       ENT: No discharge from the ears and nose, no pharynx injection, no tonsillar enlargement.        Neck: No JVD,  no bruit, no mass felt. Cardiac: S1/S2, RRR, No murmurs, No gallops or rubs Pulm: decreased air movement bilaterally. Has very mild wheezing, No rales, rhonchi or rubs. Abd: Soft, nondistended, nontender, no rebound pain, no organomegaly, BS present Ext: No edema bilaterally. 2+DP/PT pulse bilaterally. No tenderness over the calf areas bilaterally.  Musculoskeletal: No joint deformities, erythema, or stiffness, ROM full Skin: No rashes.  Neuro: Alert and oriented X3, cranial nerves II-XII grossly intact, muscle strength 5/5 in all extremeties, sensation to light touch intact.  Psych: Patient is not psychotic, no suicidal or hemocidal ideation.  Labs on Admission:  Basic Metabolic Panel:  Recent Labs Lab 07/21/14 1445 07/21/14 1821  NA 138 135  K 3.8 3.8  CL  --  104  CO2 19* 22  GLUCOSE 118 104*  BUN 14.1 15  CREATININE 0.8 0.74  CALCIUM 9.2 8.9   Liver Function Tests:  Recent Labs Lab 07/21/14 1445 07/21/14 1821  AST 40* 43*  ALT 37 37  ALKPHOS 73 66  BILITOT 0.96 1.2  PROT 6.5 6.8  ALBUMIN 2.6* 3.0*   No results for input(s): LIPASE, AMYLASE in the last 168 hours. No results for input(s): AMMONIA in the last 168 hours. CBC:  Recent Labs Lab 07/21/14 1444 07/21/14 1821   WBC 2.2* 2.3*  NEUTROABS 1.6 1.6*  HGB 9.4* 9.4*  HCT 29.6* 28.9*  MCV 89.9 91.5  PLT 121* 110*   Cardiac Enzymes:  Recent Labs Lab 07/21/14 1821  TROPONINI <0.03    BNP (last 3 results)  Recent Labs  07/21/14 1821  BNP 39.7    ProBNP (last 3 results) No results for input(s): PROBNP in the last 8760 hours.  CBG: No results for input(s): GLUCAP in the last 168 hours.  Radiological Exams on Admission: Dg Chest Port 1 View  07/21/2014   CLINICAL DATA:  Chronic shortness of breath for 3 months. Hypertension. History of metastatic lung cancer.  EXAM: PORTABLE CHEST - 1 VIEW  COMPARISON:  07/14/2014, 06/02/2014  FINDINGS: Left subclavian 2 lead pacer noted. Normal heart size and vascularity. Worsening mid and lower lung patchy airspace process compatible with superimposed pneumonia. No effusion or pneumothorax. Trachea is midline. Atherosclerosis noted of the aorta. Chronic left posterior medial diaphragmatic hernia.  IMPRESSION: Right greater than left bibasilar streaky airspace process concerning for superimposed pneumonia.   Electronically Signed   By: Daryll Brod M.D.   On: 07/21/2014 18:41    EKG: Independently reviewed.   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 His worsening shortness of breath in the past 2 weeks is likely due to pneumonia as evidenced by possible superimposed pneumonia on chest x-ray. Though patient does not have fever and chest pain, he has relative neutropenia with WBC  2.3, and he may not be able to mount a typical response to the infection. Other differential diagnosis include, but less likely pulmonary embolism (patient does not have any chest pain, no tachycardia), and congestive heart failure (BNP is normal). Currently patient is hemodynamically stable. Not  obviously septic.  - Will admit to Med-surg Bed - patient meet the criteria for CAP, but given his FYB=0.1 and complicated medical history including ongoing chemotherapy for lung cancer, I will start  IV Vancomycin and cefepime  - Urine legionella and S. pneumococcal antigen - IVF: 75 cc/h - continue home breathing treatment regimen - Follow up blood culture x2, sputum culture and respiratory virus panel  Non-small cell lung cancer, stage IV: Patient was diagnosed 01/2014. He has been followed up by Dr. Julien Nordmann. He completed his 6 rounds of chemotherapy, and 16 cycles of radiation therapy 2 weeks ago. -ED consulted oncology, Dr. Julien Nordmann is to see patient in the morning.  COPD and asthma: No signs of acute exacerbation. Patient has  very mild wheezing on lung auscultation. -Will continue home breathing treatment: Symbicort inhaler, inhaler  HTN:  -Continue Cozaar: 25 mg daily  Hx of Prostate cancer: s/p of radiation therapy 2005. PSA was 4.59 on 04/15/14. -Follow-up with [name] as outpatient after discharge  CAD and Sick sinus syndrome: s/p of stent and pacemaker placement. No chest pain. HR is well controlled. -continue plavix -PRN NTG    DVT ppx: SQ Heparin (watch platelet number, 110 on admission)      Code Status: particial (OK for CPR, but not with intubation) Family Communication:  Yes, patient's son   at bed side Disposition Plan: Admit to inpatient   Date of Service 07/21/2014    Ivor Costa [facility] Pager [phone]  If 7PM-7AM, please contact night-coverage [email or web address] Password [facility] 07/21/2014, 9:25 PM

## 2014-07-21 NOTE — ED Provider Notes (Signed)
Medical screening examination/treatment/procedure(s) were conducted as a shared visit with non-physician practitioner(s) and myself.  I personally evaluated the patient during the encounter.   EKG Interpretation None      78 rolled male with shortness of breath and cough. He was hypoxic, but is more comfortable on [place] oxygen.  On exam, well appearing, nontoxic, not distressed, mild increased work of breathing, alert oriented.  CXR showed possible pneumonia, so covered with antibiotics.  Admitted for further treatment   Clinical Impression: 1. CAP (community acquired pneumonia)   2. COPD (chronic obstructive pulmonary disease)   3. Malignant neoplasm of lung, unspecified laterality, unspecified part of lung   4. Immunocompromised   5. Acute respiratory failure with hypoxia       Houston Siren III, MD 07/22/14 [phone]

## 2014-07-21 NOTE — Assessment & Plan Note (Addendum)
Patient presented to the [facility] today to receive his first cycle of Keytruda immunotherapy.  However, patient is complaining of progressive dyspnea and weakness.  On exam-patient was noted with O2 sat at 93% on room air.  When patient attempted walk for approximately 45 seconds O2 sat down to 87%; and patient was significantly dyspneic.  Patient has no cough or wheeze.  Bilateral lung sounds diminished.  Patient has had 2 recent CT angiograms of the chest with no evidence of pulmonary embolism.  Differential for continued/progressive dyspnea would include possible radiation pneumonitis or possibly a cardiac component given patient's previous cardiac history.  Patient was placed on O2 at 2 L nasal cannula.  Due to significant/progressive dyspnea will hold planned immunotherapy today; and transport patient to the emergency department for further evaluation and management.  Brief history and report was called to the emergency department charge nurse Delsa Sale prior to the patient being transported to the ED via [place] in wheelchair.

## 2014-07-21 NOTE — ED Notes (Signed)
Patient reports that he has had increased SOB x 2 weeks. Patient reports that they have attempted to start chemo and had to be postponed due to increased SOB. Patient states he has had a productive cough with thick white sputum.

## 2014-07-21 NOTE — ED Notes (Signed)
Floor nurse unable to take report at this time.

## 2014-07-21 NOTE — Assessment & Plan Note (Signed)
Patient is status post 6 cycles of carboplatin/Alimta chemotherapy completed on 05/20/2014.  CT scan following the 6 cycles.  Field progression of his disease.  Patient was scheduled to initiate new Keytruda immunotherapy last week; but it was held due to continued dyspnea and weakness.  When patient presented today to receive his Keytruda immunotherapy-he was once again found to be very dyspneic and weak with any exertion whatsoever.  Patient has had 2 recent CT angiograms of the chest which were negative for pulmonary embolism.  Patient has been evaluated by pulmonologist Dr. Melvyn Novas recently as well with no new findings.  Patient will be transported to the emergency department for further evaluation and management this evening.  Will need to reschedule initiation of new immunotherapy Keytruda for next week if at all possible.

## 2014-07-21 NOTE — Telephone Encounter (Signed)
per pof to sch pt appt w/CB-sch-pt aware

## 2014-07-21 NOTE — ED Provider Notes (Signed)
CSN: [id number]     Arrival date & time 07/21/14  1610 History   First MD Initiated Contact with Patient 07/21/14 1805     Chief Complaint  Patient presents with  . Shortness of Breath     (Consider location/radiation/quality/duration/timing/severity/associated sxs/prior Treatment) Patient is a 79 y.o. male presenting with shortness of breath. The history is provided by the patient. No language interpreter was used.  Shortness of Breath Associated symptoms: no abdominal pain, no chest pain, no fever and no vomiting   Nicholas Schmitt is a 79 y/o M with PMHx of HTN, HLD, CAD, AAA, renal insufficiency, MI, COPD presenting to the ED with shortness of breath that has been ongoing for approximately 4 months with worsening over the past 2 weeks. Patient reported that he is short of breath with walking and at rest. Patient reported that he has been having productive cough over the past couple of days with white sputum. Patient reported that he lives at home alone and denied recent hospitalization. Patient stated that he does not use oxygen at home. Stated that he has been having chills. Reported that he did finish 6 rounds of chemo, followed by 16 rounds of radiation finished approximately 2 weeks ago - stated that he was due to start up chemo again, but not secondary to the shortness of breath. Denied neck pain, chest pain, dizziness, fainting, nausea, vomiting, diarrhea, hematochezia, travels, leg swelling, abdominal pain. PCP Dr. Sherren Mocha Oncologist Dr. Lorna Few  Past Medical History  Diagnosis Date  . Hypertension   . CAD (coronary artery disease)     positive stress test in 2006 led to left heart cath (8/06) showing 95% prox RCA, 90% CFX, and 90% mLAD.  patient had a cypher DES to all 3 lesions  . Hyperlipidemia   . Hearing loss   . AAA (abdominal aortic aneurysm) 11/09    3.2 cm   . Chronic renal insufficiency   . History of radiation therapy 03/08/04- 04/08/04    prostate 4600 cGy in 3  fractions, radioactive seed implant 05/04/04  . Myocardial infarction 1995  . Pacemaker 2014  . Dysrhythmia   . Shortness of breath     exertion  . Asthma     bronchial  . COPD (chronic obstructive pulmonary disease)   . Cancer 2015    prostate   . Prostate cancer 2005    gleason 7  . Skin cancer     ear  . Lung cancer   . Lung cancer    Past Surgical History  Procedure Laterality Date  . Ptca  2006  . Coronary stent placement    . Permanent pacemaker insertion Bilateral 02/26/13    MDT Adapta L implanted by Dr Rayann Heman for sick sinus syndrome  . Radioactive seed implant  05/04/2004    8000 cGy, Dr Danny Lawless Dr Reece Agar  . Elbow surgery Bilateral 1973  . Insert / replace / remove pacemaker    . Coronary angioplasty    . Mediastinoscopy N/A 01/28/2014    Procedure: MEDIASTINOSCOPY;  Surgeon: Melrose Nakayama, MD;  Location: [place];  Service: Thoracic;  Laterality: N/A;  . Permanent pacemaker insertion N/A 02/26/2013    Procedure: PERMANENT PACEMAKER INSERTION;  Surgeon: [name], MD;  Location: [facility];  Service: Cardiovascular;  Laterality: N/A;   Family History  Problem Relation Age of Onset  . Asthma Sister   . Asthma Brother   . Coronary artery disease Other    History  Substance  Use Topics  . Smoking status: Former Smoker -- 3.00 packs/day for 35 years    Types: Cigarettes    Quit date: 07/30/1993  . Smokeless tobacco: Never Used  . Alcohol Use: No     Comment: hx alcohol abuse per note dated 04/11/11    Review of Systems  Constitutional: Positive for chills. Negative for fever.  Respiratory: Positive for shortness of breath. Negative for chest tightness.   Cardiovascular: Negative for chest pain and leg swelling.  Gastrointestinal: Negative for nausea, vomiting, abdominal pain and diarrhea.  Neurological: Negative for dizziness and weakness.      Allergies  Review of patient's allergies indicates no known allergies.  Home Medications    Prior to Admission medications   Medication Sig Start Date End Date Taking? Authorizing Provider  albuterol (PROVENTIL,VENTOLIN) 90 MCG/ACT inhaler Inhale 2 puffs into the lungs every 6 (six) hours as needed for wheezing or shortness of breath. 03/21/11  Yes Dorena Cookey, MD  Aspirin-Acetaminophen-Caffeine (GOODY HEADACHE PO) Take 1 packet by mouth daily. As needed   Yes Historical Provider, MD  budesonide-formoterol (SYMBICORT) 160-4.5 MCG/ACT inhaler 1 puff twice daily Patient taking differently: 2 puffs. 2 puffs twice per day 04/29/14  Yes Dorena Cookey, MD  clopidogrel (PLAVIX) 75 MG tablet Take 1 tablet (75 mg total) by mouth daily. 04/29/14  Yes Dorena Cookey, MD  folic acid (FOLVITE) 1 MG tablet Take 1 tablet (1 mg total) by mouth daily. 01/30/14  Yes Curt Bears, MD  hyaluronate sodium (RADIAPLEXRX) GEL Apply 1 application topically once.   Yes Historical Provider, MD  Multiple Minerals (CALCIUM/MAGNESIUM/ZINC PO) Take 1 capsule by mouth every evening.   Yes Historical Provider, MD  Multiple Vitamin (MULTIVITAMIN) tablet Take 1 tablet by mouth daily.    Yes Historical Provider, MD  naproxen sodium (ANAPROX) 220 MG tablet Take 440 mg by mouth 2 (two) times daily as needed (pain).    Yes Historical Provider, MD  nitroGLYCERIN (NITROSTAT) 0.4 MG SL tablet Place 0.4 mg under the tongue every 5 (five) minutes as needed for chest pain (MAX 3 TABLETS). 06/05/13  Yes Dorena Cookey, MD  oxyCODONE (OXY IR/ROXICODONE) 5 MG immediate release tablet Take 1-2 tablets (5-10 mg total) by mouth every 4 (four) hours as needed for moderate pain. 05/29/14  Yes Curt Bears, MD  prochlorperazine (COMPAZINE) 10 MG tablet Take 1 tablet (10 mg total) by mouth every 6 (six) hours as needed for nausea or vomiting. 01/30/14  Yes Curt Bears, MD  levofloxacin (LEVAQUIN) 500 MG tablet Take 1 tablet (500 mg total) by mouth daily. Patient not taking: Reported on 07/21/2014 07/14/14   Carlton Adam, PA-C   losartan (COZAAR) 50 MG tablet Take 1 tablet (50 mg total) by mouth daily. Patient not taking: Reported on 07/21/2014 04/29/14   Dorena Cookey, MD  sucralfate (CARAFATE) 1 G tablet Take 1 g by mouth 4 (four) times daily -  with meals and at bedtime. Dissolve tablets in 15-30 ml of water before taking.  Take half hour before meals and at bedtime.  Disp. 42 tablets. 0 refills. Called in to [facility] per Dr. Lisbeth Renshaw. 07/11/14   Jodelle Gross, MD  sucralfate (CARAFATE) 1 GM/10ML suspension Take 10 mLs (1 g total) by mouth 4 (four) times daily -  with meals and at bedtime. Patient not taking: Reported on 07/21/2014 07/08/14   Blair Promise, MD   BP 119/75 mmHg  Pulse 82  Temp(Src) 98.5 F (36.9 C)  Resp 29  SpO2 97% Physical Exam  Constitutional: He is oriented to person, place, and time. He appears well-developed and well-nourished. No distress.  HENT:  Head: Normocephalic and atraumatic.  Eyes: Conjunctivae and EOM are normal. Pupils are equal, round, and reactive to light. Right eye exhibits no discharge. Left eye exhibits no discharge.  Neck: Normal range of motion. Neck supple. No tracheal deviation present.  Cardiovascular: Normal rate, regular rhythm and normal heart sounds.  Exam reveals no friction rub.   No murmur heard. Pulses:      Radial pulses are 2+ on the right side, and 2+ on the left side.  Negative swelling or pitting edema noted to the lower lobes bilaterally  Cap refill < 3 seconds  Pulmonary/Chest: Effort normal. No respiratory distress. He has wheezes. He has rales. He exhibits no tenderness.  Decreased breath sounds to upper and lower lobes bilaterally. Rales noted to lower lobes bilaterally.  Patient is able to speak in full sentences without difficulty  Negative use of accessory muscles Negative stridor  Musculoskeletal: Normal range of motion.  Lymphadenopathy:    He has no cervical adenopathy.  Neurological: He is alert and oriented to person, place, and time.  No cranial nerve deficit. He exhibits normal muscle tone. Coordination normal.  Patient follows commands well  Patient responds to questions appropriately   Skin: Skin is warm and dry. No rash noted. He is not diaphoretic. No erythema.  Psychiatric: He has a normal mood and affect. His behavior is normal. Thought content normal.  Nursing note and vitals reviewed.   ED Course  Procedures (including critical care time)  Results for orders placed or performed during the hospital encounter of 07/21/14  CBC with Differential/Platelet  Result Value Ref Range   WBC 2.3 (L) 4.0 - 10.5 K/uL   RBC 3.16 (L) 4.22 - 5.81 MIL/uL   Hemoglobin 9.4 (L) 13.0 - 17.0 g/dL   HCT 28.9 (L) 39.0 - 52.0 %   MCV 91.5 78.0 - 100.0 fL   MCH 29.7 26.0 - 34.0 pg   MCHC 32.5 30.0 - 36.0 g/dL   RDW 18.6 (H) 11.5 - 15.5 %   Platelets 110 (L) 150 - 400 K/uL   Neutrophils Relative % 70 43 - 77 %   Neutro Abs 1.6 (L) 1.7 - 7.7 K/uL   Lymphocytes Relative 9 (L) 12 - 46 %   Lymphs Abs 0.2 (L) 0.7 - 4.0 K/uL   Monocytes Relative 16 (H) 3 - 12 %   Monocytes Absolute 0.4 0.1 - 1.0 K/uL   Eosinophils Relative 5 0 - 5 %   Eosinophils Absolute 0.1 0.0 - 0.7 K/uL   Basophils Relative 0 0 - 1 %   Basophils Absolute 0.0 0.0 - 0.1 K/uL  Comprehensive metabolic panel  Result Value Ref Range   Sodium 135 135 - 145 mmol/L   Potassium 3.8 3.5 - 5.1 mmol/L   Chloride 104 96 - 112 mmol/L   CO2 22 19 - 32 mmol/L   Glucose, Bld 104 (H) 70 - 99 mg/dL   BUN 15 6 - 23 mg/dL   Creatinine, Ser 0.74 0.50 - 1.35 mg/dL   Calcium 8.9 8.4 - 10.5 mg/dL   Total Protein 6.8 6.0 - 8.3 g/dL   Albumin 3.0 (L) 3.5 - 5.2 g/dL   AST 43 (H) 0 - 37 U/L   ALT 37 0 - 53 U/L   Alkaline Phosphatase 66 39 - 117 U/L   Total Bilirubin 1.2 0.3 -  1.2 mg/dL   GFR calc non Af Amer 87 (L) >90 mL/min   GFR calc Af Amer >90 >90 mL/min   Anion gap 9 5 - 15  Troponin I  Result Value Ref Range   Troponin I <0.03 <0.031 ng/mL  Urinalysis, Routine w reflex  microscopic  Result Value Ref Range   Color, Urine AMBER (A) YELLOW   APPearance CLEAR CLEAR   Specific Gravity, Urine 1.021 1.005 - 1.030   pH 5.5 5.0 - 8.0   Glucose, UA NEGATIVE NEGATIVE mg/dL   Hgb urine dipstick NEGATIVE NEGATIVE   Bilirubin Urine NEGATIVE NEGATIVE   Ketones, ur NEGATIVE NEGATIVE mg/dL   Protein, ur NEGATIVE NEGATIVE mg/dL   Urobilinogen, UA 1.0 0.0 - 1.0 mg/dL   Nitrite NEGATIVE NEGATIVE   Leukocytes, UA NEGATIVE NEGATIVE  Brain natriuretic peptide  Result Value Ref Range   B Natriuretic Peptide 39.7 0.0 - 100.0 pg/mL    Labs Review Labs Reviewed  CBC WITH DIFFERENTIAL/PLATELET - Abnormal; Notable for the following:    WBC 2.3 (*)    RBC 3.16 (*)    Hemoglobin 9.4 (*)    HCT 28.9 (*)    RDW 18.6 (*)    Platelets 110 (*)    Neutro Abs 1.6 (*)    Lymphocytes Relative 9 (*)    Lymphs Abs 0.2 (*)    Monocytes Relative 16 (*)    All other components within normal limits  COMPREHENSIVE METABOLIC PANEL - Abnormal; Notable for the following:    Glucose, Bld 104 (*)    Albumin 3.0 (*)    AST 43 (*)    GFR calc non Af Amer 87 (*)    All other components within normal limits  URINALYSIS, ROUTINE W REFLEX MICROSCOPIC - Abnormal; Notable for the following:    Color, Urine AMBER (*)    All other components within normal limits  CULTURE, BLOOD (ROUTINE X 2)  CULTURE, BLOOD (ROUTINE X 2)  TROPONIN I  BRAIN NATRIURETIC PEPTIDE    Imaging Review Dg Chest Port 1 View  07/21/2014   CLINICAL DATA:  Chronic shortness of breath for 3 months. Hypertension. History of metastatic lung cancer.  EXAM: PORTABLE CHEST - 1 VIEW  COMPARISON:  07/14/2014, 06/02/2014  FINDINGS: Left subclavian 2 lead pacer noted. Normal heart size and vascularity. Worsening mid and lower lung patchy airspace process compatible with superimposed pneumonia. No effusion or pneumothorax. Trachea is midline. Atherosclerosis noted of the aorta. Chronic left posterior medial diaphragmatic hernia.   IMPRESSION: Right greater than left bibasilar streaky airspace process concerning for superimposed pneumonia.   Electronically Signed   By: Daryll Brod M.D.   On: 07/21/2014 18:41     EKG Interpretation None       8:19 PM This provider spoke with on-call Oncologist, Dr. Burr Medico - discussed case, labs, imaging in great detail. Oncology to consult patient.   8:42 PM This provider spoke with Dr. Blaine Hamper, [facility]. Discussed case, labs, imaging, ED course in great detail. Patient to be admitted to [place].  MDM   Final diagnoses:  CAP (community acquired pneumonia)  Malignant neoplasm of lung, unspecified laterality, unspecified part of lung  Immunocompromised   Medications  azithromycin (ZITHROMAX) 500 mg in dextrose 5 % 250 mL IVPB (not administered)  cefTRIAXone (ROCEPHIN) 1 g in dextrose 5 % 50 mL IVPB (1 g Intravenous New Bag/Given 07/21/14 2014)  sodium chloride 0.9 % bolus 500 mL (500 mLs Intravenous New Bag/Given 07/21/14 2013)    Filed  Vitals:   07/21/14 1700 07/21/14 1730 07/21/14 1800 07/21/14 1900  BP: 112/71 137/75 135/74 119/75  Pulse: 80 86 87 82  Temp:      Resp: 23 32 24 29  SpO2: 93% 99% 92% 97%   EKG noted sinus arrhythmia with a heart rate of 96 bpm with a right bundle branch block. Troponin negative elevation. BNP negative elevation. CBC noted leukocytosis of 2.3. Hemoglobin 9.4, hematocrit 28.9 - when compared to previous labs patient's hemoglobin has been as low as 8.0 and hematocrit 25.4, patient chronically anemic. CMP noted glucose of 104, negative elevated anion gap - 9.0 mEq per liter. Urinalysis negative for hemoglobin, nitrites, leukocytes-negative signs of infection. Chest x-ray noted right greater than left bibasilar streaky airspace process concerning for superimposed pneumonia. Patient presenting to the ED with shortness of breath that has been ongoing for the past 4 months with worsening over the past 2 weeks. Patient reported that he's been having  productive cough. Patient resented to the ED with what appears to be superimposed pneumonia as noted on chest x-ray. Blood cultures obtained and pending. Patient started on IV antibiotics and IV fluids in ED setting. Tachypneic placed on oxygen therapy. Oncology consulted and oncology to consult patient while admission. Patient to be admitted under the care of [facility] to [place] floor. Discussed plan for admission with patient and family at bedside who agreed to plan of care. Patient stable for transfer.  Jamse Mead, PA-C 07/21/14 2047  Houston Siren III, MD 07/22/14 [phone]

## 2014-07-21 NOTE — Progress Notes (Signed)
SYMPTOM MANAGEMENT CLINIC   HPI: Nicholas Schmitt 79 y.o. male diagnosed with lung cancer.  Patient is status post carboplatin/Alimta chemotherapy completed on 05/20/2014.  Patient completed palliative radiation to both the chest and lumbar spine on 07/11/2014.  Due to progression of disease-patient presented to initiate Keytruda immunotherapy.   Patient was scheduled to initiate new regimen of Keytruda immunotherapy last week; but it was held due to continued dyspnea and progressive weakness.  Patient presented to the [facility] today to once again attempt initiation of his immunotherapy.  Patient is reporting progressive dyspnea and weakness.  Patient denies any cough or wheeze.  Patient states that he is only able to walk a few steps before he becomes severely dyspneic.  Patient is requesting home O2 at all possible.  Of note-patient has had 2 different CT angiograms of the chest recently which were negative for pulmonary embolism.  Patient has also been evaluated by pulmonologist Dr. Melvyn Novas recently with no acute findings.  HPI  ROS  Past Medical History  Diagnosis Date  . Hypertension   . CAD (coronary artery disease)     positive stress test in 2006 led to left heart cath (8/06) showing 95% prox RCA, 90% CFX, and 90% mLAD.  patient had a cypher DES to all 3 lesions  . Hyperlipidemia   . Hearing loss   . AAA (abdominal aortic aneurysm) 11/09    3.2 cm   . Chronic renal insufficiency   . History of radiation therapy 03/08/04- 04/08/04    prostate 4600 cGy in 3 fractions, radioactive seed implant 05/04/04  . Myocardial infarction 1995  . Pacemaker 2014  . Dysrhythmia   . Shortness of breath     exertion  . Asthma     bronchial  . COPD (chronic obstructive pulmonary disease)   . Cancer 2015    prostate   . Prostate cancer 2005    gleason 7  . Skin cancer     ear  . Lung cancer   . Lung cancer     Past Surgical History  Procedure Laterality Date  . Ptca  2006  .  Coronary stent placement    . Permanent pacemaker insertion Bilateral 02/26/13    MDT Adapta L implanted by Dr Rayann Heman for sick sinus syndrome  . Radioactive seed implant  05/04/2004    8000 cGy, Dr Danny Lawless Dr Reece Agar  . Elbow surgery Bilateral 1973  . Insert / replace / remove pacemaker    . Coronary angioplasty    . Mediastinoscopy N/A 01/28/2014    Procedure: MEDIASTINOSCOPY;  Surgeon: Melrose Nakayama, MD;  Location: [facility];  Service: Thoracic;  Laterality: N/A;  . 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    has No Known Allergies.    Medication List       This list is accurate as of: 07/21/14  4:10 PM.  Always use your most recent med list.  albuterol 90 MCG/ACT inhaler  Commonly known as:  PROVENTIL,VENTOLIN  Inhale 2 puffs into the lungs every 6 (six) hours as needed for wheezing or shortness of breath.     budesonide-formoterol 160-4.5 MCG/ACT inhaler  Commonly known as:  SYMBICORT  1 puff twice daily     CALCIUM/MAGNESIUM/ZINC PO  Take 1 capsule by mouth every evening.     clopidogrel 75 MG tablet  Commonly known as:  PLAVIX  Take 1 tablet (75 mg total) by mouth daily.     folic acid 1 MG tablet  Commonly known as:  FOLVITE  Take 1 tablet (1 mg total) by mouth daily.     GOODY HEADACHE PO  Take 1  packet by mouth daily. As needed     hyaluronate sodium Gel  Apply 1 application topically once.     levofloxacin 500 MG tablet  Commonly known as:  LEVAQUIN  Take 1 tablet (500 mg total) by mouth daily.     losartan 50 MG tablet  Commonly known as:  COZAAR  Take 1 tablet (50 mg total) by mouth daily.     multivitamin tablet  Take 1 tablet by mouth daily.     naproxen sodium 220 MG tablet  Commonly known as:  ANAPROX  Take 440 mg by mouth 2 (two) times daily as needed (pain).     nitroGLYCERIN 0.4 MG SL tablet  Commonly known as:  NITROSTAT  Place 0.4 mg under the tongue every 5 (five) minutes as needed for chest pain (MAX 3 TABLETS).     oxyCODONE 5 MG immediate release tablet  Commonly known as:  Oxy IR/ROXICODONE  Take 1-2 tablets (5-10 mg total) by mouth every 4 (four) hours as needed for moderate pain.     prochlorperazine 10 MG tablet  Commonly known as:  COMPAZINE  Take 1 tablet (10 mg total) by mouth every 6 (six) hours as needed for nausea or vomiting.     sucralfate 1 GM/10ML suspension  Commonly known as:  CARAFATE  Take 10 mLs (1 g total) by mouth 4 (four) times daily -  with meals and at bedtime.     sucralfate 1 G tablet  Commonly known as:  CARAFATE  Take 1 g by mouth 4 (four) times daily -  with meals and at bedtime. Dissolve tablets in 15-30 ml of water before taking.  Take half hour before meals and at bedtime.  Disp. 42 tablets. 0 refills. Called in to [facility] per Dr. Lisbeth Renshaw.         PHYSICAL EXAMINATION  Blood pressure 112/72, pulse 93, temperature 98.2 F (36.8 C), temperature source Oral, resp. rate 22, height 5' 11"  (1.803 m), weight 197 lb (89.359 kg), SpO2 93 %.  Physical Exam  Constitutional: He is oriented to person, place, and time. He appears malnourished and dehydrated. He appears unhealthy. He appears cachectic. He appears distressed.  HENT:  Head: Normocephalic and atraumatic.  Mouth/Throat: Oropharynx is clear and moist.    Eyes: Conjunctivae and EOM are normal. Pupils are equal, round, and reactive to light. Right eye exhibits no discharge. Left eye exhibits no discharge. No scleral icterus.  Neck: Normal range of motion. Neck supple. No JVD present. No tracheal deviation present. No thyromegaly present.  Cardiovascular: Normal rate, regular rhythm, normal heart sounds and intact distal pulses.   Pulmonary/Chest: He is in respiratory distress. He has no wheezes. He has no rales. He exhibits no tenderness.  On initial exam-patient with only mild dyspnea at  rest; with O2 sat at 93% on room air.  Patient was ambulated for approximate 45 seconds and O2 sats dropped down to 87% on room air.  Patient developed fairly severe dyspnea/moderate respiratory distress.  Bilateral breath sounds were diminished in all lung fields; but no wheezing or cough noted on exam.    Abdominal: Soft. Bowel sounds are normal. He exhibits no distension and no mass. There is no tenderness. There is no rebound and no guarding.  Musculoskeletal: Normal range of motion. He exhibits no edema or tenderness.  Lymphadenopathy:    He has no cervical adenopathy.  Neurological: He is alert and oriented to person, place, and time. Gait normal.  Skin: Skin is warm and dry. No rash noted. No erythema. There is pallor.  Psychiatric: Affect normal.  Nursing note and vitals reviewed.   LABORATORY DATA:. Admission on 07/21/2014  Component Date Value Ref Range Status  . WBC 07/21/2014 2.3* 4.0 - 10.5 K/uL Final  . RBC 07/21/2014 3.16* 4.22 - 5.81 MIL/uL Final  . Hemoglobin 07/21/2014 9.4* 13.0 - 17.0 g/dL Final  . HCT 07/21/2014 28.9* 39.0 - 52.0 % Final  . MCV 07/21/2014 91.5  78.0 - 100.0 fL Final  . MCH 07/21/2014 29.7  26.0 - 34.0 pg Final  . MCHC 07/21/2014 32.5  30.0 - 36.0 g/dL Final  . RDW 07/21/2014 18.6* 11.5 - 15.5 % Final  . Platelets 07/21/2014 PENDING  150 - 400 K/uL Incomplete  . Neutrophils Relative % 07/21/2014 70  43 - 77 % Final  .  Neutro Abs 07/21/2014 1.6* 1.7 - 7.7 K/uL Final  . Lymphocytes Relative 07/21/2014 9* 12 - 46 % Final  . Lymphs Abs 07/21/2014 0.2* 0.7 - 4.0 K/uL Final  . Monocytes Relative 07/21/2014 16* 3 - 12 % Final  . Monocytes Absolute 07/21/2014 0.4  0.1 - 1.0 K/uL Final  . Eosinophils Relative 07/21/2014 5  0 - 5 % Final  . Eosinophils Absolute 07/21/2014 0.1  0.0 - 0.7 K/uL Final  . Basophils Relative 07/21/2014 0  0 - 1 % Final  . Basophils Absolute 07/21/2014 0.0  0.0 - 0.1 K/uL Final  Appointment on 07/21/2014  Component Date Value Ref Range Status  . Hold Tube, Blood Bank 07/21/2014 Blood Bank Order Cancelled   Final  . WBC 07/21/2014 2.2* 4.0 - 10.3 10e3/uL Final  . NEUT# 07/21/2014 1.6  1.5 - 6.5 10e3/uL Final  . HGB 07/21/2014 9.4* 13.0 - 17.1 g/dL Final  . HCT 07/21/2014 29.6* 38.4 - 49.9 % Final  . Platelets 07/21/2014 121* 140 - 400 10e3/uL Final  . MCV 07/21/2014 89.9  79.3 - 98.0 fL Final  . MCH 07/21/2014 28.5  27.2 - 33.4 pg Final  . MCHC 07/21/2014 31.7* 32.0 - 36.0 g/dL Final  . RBC 07/21/2014 3.30* 4.20 - 5.82 10e6/uL Final  . RDW 07/21/2014 20.1* 11.0 - 14.6 % Final  . lymph# 07/21/2014 0.2* 0.9 - 3.3 10e3/uL Final  . MONO# 07/21/2014 0.3  0.1 - 0.9 10e3/uL Final  . Eosinophils Absolute 07/21/2014 0.1  0.0 - 0.5 10e3/uL Final  . Basophils Absolute 07/21/2014 0.0  0.0 - 0.1 10e3/uL Final  . NEUT% 07/21/2014 72.9  39.0 - 75.0 % Final  . LYMPH% 07/21/2014 8.1* 14.0 - 49.0 % Final  . MONO% 07/21/2014 13.6  0.0 - 14.0 % Final  . EOS% 07/21/2014 4.7  0.0 - 7.0 % Final  . BASO% 07/21/2014 0.7  0.0 - 2.0 % Final  . Sodium 07/21/2014 138  136 - 145 mEq/L Final  . Potassium 07/21/2014 3.8  3.5 - 5.1 mEq/L Final  . Chloride 07/21/2014 106  98 - 109 mEq/L Final  . CO2 07/21/2014 19* 22 - 29 mEq/L Final  . Glucose 07/21/2014 118  70 - 140 mg/dl Final  . BUN 07/21/2014 14.1  7.0 - 26.0 mg/dL Final  . Creatinine 07/21/2014 0.8  0.7 - 1.3 mg/dL Final  . Total Bilirubin 07/21/2014  0.96  0.20 - 1.20 mg/dL Final  . Alkaline Phosphatase 07/21/2014 73  40 - 150 U/L Final  . AST 07/21/2014 40* 5 - 34 U/L Final  . ALT 07/21/2014 37  0 - 55 U/L Final  . Total Protein 07/21/2014 6.5  6.4 - 8.3 g/dL Final  . Albumin 07/21/2014 2.6* 3.5 - 5.0 g/dL Final  . Calcium 07/21/2014 9.2  8.4 - 10.4 mg/dL Final  . Anion Gap 07/21/2014 13* 3 - 11 mEq/L Final  . EGFR 07/21/2014 88* >90 ml/min/1.73 m2 Final   eGFR is calculated using the CKD-EPI Creatinine Equation (2009)     RADIOGRAPHIC STUDIES: No results found.  ASSESSMENT/PLAN:    Dyspnea Patient presented to the [facility] today to receive his first cycle of Keytruda immunotherapy.  However, patient is complaining of progressive dyspnea and weakness.  On exam-patient was noted with O2 sat at 93% on room air.  When patient attempted walk for approximately 45 seconds O2 sat down to 87%; and patient was significantly dyspneic.  Patient has no cough or wheeze.  Bilateral lung sounds diminished.  Patient has had 2 recent CT angiograms of the chest with no evidence of pulmonary embolism.  Differential for continued/progressive dyspnea would include possible radiation pneumonitis or possibly a cardiac component given patient's previous cardiac history.  Patient was placed on O2 at 2 L nasal cannula.  Due to significant/progressive dyspnea will hold planned immunotherapy today; and transport patient to the emergency department for further evaluation and management.  Brief history and report was called to the emergency department charge nurse Delsa Sale prior to the patient being transported to the ED via [place] in wheelchair.   Non-small cell carcinoma of lung, stage 4 Patient is status post 6 cycles of carboplatin/Alimta chemotherapy completed on 05/20/2014.  CT scan following the 6 cycles.  Field progression of his disease.  Patient was scheduled to initiate new Keytruda immunotherapy last week; but it was held due to continued  dyspnea and weakness.  When patient presented today to receive his Keytruda immunotherapy-he was once again found to be very dyspneic and weak with any exertion whatsoever.  Patient has had 2 recent CT angiograms of the chest which were negative for pulmonary embolism.  Patient has been evaluated by pulmonologist Dr. Melvyn Novas recently as well with no new findings.  Patient will be transported to the emergency department for further evaluation and management this evening.  Will need to reschedule initiation of new immunotherapy Keytruda for next week if at all possible.   Patient stated understanding of all instructions; and was in agreement with this plan of care. The patient knows to call the clinic with any problems, questions or concerns.   Review/collaboration with Dr. Julien Nordmann regarding all aspects of patient's visit today.   Total time spent with patient was 40 minutes;  with greater than 75 percent of that time spent in face to face counseling regarding patient's symptoms,  and coordination of care and follow up.  Disclaimer: This note was dictated with voice recognition software. Similar sounding  words can inadvertently be transcribed and may not be corrected upon review.   Drue Second, NP 07/21/2014

## 2014-07-22 ENCOUNTER — Telehealth: Payer: Self-pay | Admitting: Nurse Practitioner

## 2014-07-22 ENCOUNTER — Telehealth: Payer: Self-pay | Admitting: Internal Medicine

## 2014-07-22 DIAGNOSIS — I10 Essential (primary) hypertension: Secondary | ICD-10-CM

## 2014-07-22 DIAGNOSIS — C3411 Malignant neoplasm of upper lobe, right bronchus or lung: Secondary | ICD-10-CM

## 2014-07-22 DIAGNOSIS — J189 Pneumonia, unspecified organism: Principal | ICD-10-CM

## 2014-07-22 DIAGNOSIS — R0600 Dyspnea, unspecified: Secondary | ICD-10-CM

## 2014-07-22 LAB — CBC WITH DIFFERENTIAL/PLATELET
Basophils Absolute: 0 10*3/uL (ref 0.0–0.1)
Basophils Relative: 0 % (ref 0–1)
EOS ABS: 0.1 10*3/uL (ref 0.0–0.7)
EOS PCT: 7 % — AB (ref 0–5)
HCT: 26.4 % — ABNORMAL LOW (ref 39.0–52.0)
Hemoglobin: 8.4 g/dL — ABNORMAL LOW (ref 13.0–17.0)
LYMPHS PCT: 12 % (ref 12–46)
Lymphs Abs: 0.2 10*3/uL — ABNORMAL LOW (ref 0.7–4.0)
MCH: 29.4 pg (ref 26.0–34.0)
MCHC: 31.8 g/dL (ref 30.0–36.0)
MCV: 92.3 fL (ref 78.0–100.0)
MONO ABS: 0.3 10*3/uL (ref 0.1–1.0)
MONOS PCT: 15 % — AB (ref 3–12)
Neutro Abs: 1.1 10*3/uL — ABNORMAL LOW (ref 1.7–7.7)
Neutrophils Relative %: 67 % (ref 43–77)
Platelets: 96 10*3/uL — ABNORMAL LOW (ref 150–400)
RBC: 2.86 MIL/uL — ABNORMAL LOW (ref 4.22–5.81)
RDW: 18.8 % — AB (ref 11.5–15.5)
WBC: 1.7 10*3/uL — ABNORMAL LOW (ref 4.0–10.5)

## 2014-07-22 LAB — BASIC METABOLIC PANEL
Anion gap: 5 (ref 5–15)
BUN: 15 mg/dL (ref 6–23)
CO2: 25 mmol/L (ref 19–32)
Calcium: 8.8 mg/dL (ref 8.4–10.5)
Chloride: 105 mmol/L (ref 96–112)
Creatinine, Ser: 0.73 mg/dL (ref 0.50–1.35)
GFR calc Af Amer: >90 mL/min (ref >90)
GFR calc non Af Amer: 88 mL/min — ABNORMAL LOW (ref >90)
Glucose, Bld: 125 mg/dL — ABNORMAL HIGH (ref 70–99)
POTASSIUM: 4 mmol/L (ref 3.5–5.1)
SODIUM: 135 mmol/L (ref 135–145)

## 2014-07-22 LAB — STREP PNEUMONIAE URINARY ANTIGEN: Strep Pneumo Urinary Antigen: NEGATIVE

## 2014-07-22 LAB — LACTIC ACID, PLASMA: Lactic Acid, Venous: 0.9 mmol/L (ref 0.5–2.0)

## 2014-07-22 MED ORDER — ENSURE COMPLETE PO LIQD: 237.0000 mL | ORAL | Status: DC | PRN

## 2014-07-22 MED ORDER — HYDROXYZINE HCL 25 MG PO TABS
25.0000 mg | ORAL_TABLET | Freq: Three times a day (TID) | ORAL | Status: DC | PRN
  Administered 2014-07-22 (×2): 25 mg via ORAL
  Filled 2014-07-22 (×2): qty 1

## 2014-07-22 MED ORDER — DEXTROSE 5 % IV SOLN
2.0000 g | Freq: Three times a day (TID) | INTRAVENOUS | Status: DC
  Administered 2014-07-22 – 2014-07-25 (×9): 2 g via INTRAVENOUS
  Filled 2014-07-22 (×10): qty 2

## 2014-07-22 NOTE — Progress Notes (Signed)
INITIAL NUTRITION ASSESSMENT  DOCUMENTATION CODES Per approved criteria  -Not Applicable   INTERVENTION: Ensure supplement PRN Encourage PO intake  NUTRITION DIAGNOSIS: Increased nutrient needs related to lung cancer as evidenced by estimated nutritional needs.   Goal: Pt to meet >/= 90% of their estimated nutrition needs   Monitor:  PO and supplemental intake, weight, labs, I/O's  Reason for Assessment: Pt identified as at nutrition risk on the Malnutrition Screen Tool  Admitting Dx: SOB (shortness of breath)  ASSESSMENT: 79 y.o. male with past medical history of coronary artery disease (post status of stent placement), pacemaker placement, hypertension, AAA (3.2 cm x 3.4 cm by ultrasound on 04/09/14), chronic kidney disease, history of prostate cancer (post status of radiation therapy 2005), asthma, COPD, skin cancer, lung cancer, who presents with worsening shortness breath.  PO intake: 100%  Pt reports appetite as adequate. It has improved since his cancer treatment. Pt states he lost weight d/t his radiation treatment. He had difficulty swallowing and pain in his throat.  Per weight history, pt has lost 11 lb over the last month (5% weight loss x 1 month, significant for time frame).  Pt may benefit from nutritional supplement if PO intake decreases. Pt not interested in supplement at this time, but encouraged pt to drink supplement if PO intake decreases. RD to order PRN.  Labs reviewed: WNL  Height: Ht Readings from Last 1 Encounters:  07/21/14 5\' 11"  (1.803 m)    Weight: Wt Readings from Last 1 Encounters:  07/21/14 197 lb (89.359 kg)    Ideal Body Weight: 172 lb  % Ideal Body Weight: 115%  Wt Readings from Last 10 Encounters:  07/21/14 197 lb (89.359 kg)  07/14/14 198 lb 9.6 oz (90.084 kg)  07/11/14 202 lb 3.2 oz (91.717 kg)  07/07/14 209 lb (94.802 kg)  07/02/14 206 lb 12.8 oz (93.804 kg)  06/18/14 208 lb 12.8 oz (94.711 kg)  06/17/14 208 lb 9.6 oz  (94.62 kg)  06/10/14 207 lb 9.6 oz (94.167 kg)  06/02/14 209 lb (94.802 kg)  05/26/14 205 lb (92.987 kg)    Usual Body Weight: 220 lb -per pt  % Usual Body Weight: 89%  BMI:  27.6  Estimated Nutritional Needs: Kcal: 2200-2400 Protein: 105-115g Fluid: 2.2L/day  Skin: intact  Diet Order: Diet Heart  EDUCATION NEEDS: -No education needs identified at this time   Intake/Output Summary (Last 24 hours) at 07/22/14 0917 Last data filed at 07/22/14 0516  Gross per 24 hour  Intake      0 ml  Output    500 ml  Net   -500 ml    Last BM: 2/8  Labs:   Recent Labs Lab 07/21/14 1445 07/21/14 1821 07/22/14 0118  NA 138 135 135  K 3.8 3.8 4.0  CL  --  104 105  CO2 19* 22 25  BUN 14.1 15 15   CREATININE 0.8 0.74 0.73  CALCIUM 9.2 8.9 8.8  GLUCOSE 118 104* 125*    CBG (last 3)  No results for input(s): GLUCAP in the last 72 hours.  Scheduled Meds: . azithromycin  500 mg Intravenous Once  . budesonide-formoterol  2 puff Inhalation BID  . ceFEPime (MAXIPIME) IV  1 g Intravenous 3 times per day  . clopidogrel  75 mg Oral Daily  . dextromethorphan-guaiFENesin  1 tablet Oral BID  . folic acid  1 mg Oral Daily  . heparin  5,000 Units Subcutaneous 3 times per day  . hyaluronate sodium  1  application Topical Once  . losartan  25 mg Oral Daily  . multivitamin with minerals  1 tablet Oral Daily  . sucralfate  1 g Oral TID WC & HS  . vancomycin  1,250 mg Intravenous BID    Continuous Infusions:   Past Medical History  Diagnosis Date  . Hypertension   . CAD (coronary artery disease)     positive stress test in 2006 led to left heart cath (8/06) showing 95% prox RCA, 90% CFX, and 90% mLAD.  patient had a cypher DES to all 3 lesions  . Hyperlipidemia   . Hearing loss   . AAA (abdominal aortic aneurysm) 11/09    3.2 cm   . Chronic renal insufficiency   . History of radiation therapy 03/08/04- 04/08/04    prostate 4600 cGy in 3 fractions, radioactive seed implant  05/04/04  . Myocardial infarction 1995  . Pacemaker 2014  . Dysrhythmia   . Shortness of breath     exertion  . Asthma     bronchial  . COPD (chronic obstructive pulmonary disease)   . Cancer 2015    prostate   . Prostate cancer 2005    gleason 7  . Skin cancer     ear  . Lung cancer   . Lung cancer     Past Surgical History  Procedure Laterality Date  . Ptca  2006  . Coronary stent placement    . Permanent pacemaker insertion Bilateral 02/26/13    MDT Adapta L implanted by Dr Rayann Heman for sick sinus syndrome  . Radioactive seed implant  05/04/2004    8000 cGy, Dr Danny Lawless Dr Reece Agar  . Elbow surgery Bilateral 1973  . Insert / replace / remove pacemaker    . Coronary angioplasty    . Mediastinoscopy N/A 01/28/2014    Procedure: MEDIASTINOSCOPY;  Surgeon: Melrose Nakayama, MD;  Location: [facility];  Service: Thoracic;  Laterality: N/A;  . Permanent pacemaker insertion N/A 02/26/2013    Procedure: PERMANENT PACEMAKER INSERTION;  Surgeon: [name], MD;  Location: [facility] C[facility];  Service: Cardiovascular;  Laterality: N/A;    Clayton Bibles, MS, RD, LDN Pager: [phone] After Hours Pager: [phone]

## 2014-07-22 NOTE — Consult Note (Signed)
PULMONARY / CRITICAL CARE MEDICINE   Name: Nicholas Schmitt MRN: [id number] DOB: 10/27/1935    ADMISSION DATE:  07/21/2014 CONSULTATION DATE:  07/22/2014  REFERRING MD :  Julien Nordmann  CHIEF COMPLAINT:  Shortness of breath  INITIAL PRESENTATION: 79 year old male with stage IV lung cancer was admitted on 07/21/2014 with acute hypoxemic respiratory failure in the setting of a right lower lobe infiltrate.  STUDIES:  06/17/2014 images reviewed by me> mild paraseptal and centrilobular emphysema, pulmonary nodule slightly increased 07/14/2014 CT chest> pulmonary nodules are grossly stable but there is increased groundglass and interlobular septal thickening in the bases of the lungs bilaterally, right greater than left  SIGNIFICANT EVENTS:  HISTORY OF PRESENT ILLNESS:  This is a very pleasant 79 year old male with advanced adenocarcinoma of the lung who was admitted on 07/21/2014 with acute hypoxemic respiratory failure. He was diagnosed with adenocarcinoma in August 2016 and he underwent treatment with a carboplatin based chemotherapy regimen as well as palliative radiation therapy of the right upper chest area and spine. He was noted on the January 2016 CT chest a have progression of radiographic findings of malignancy and so plans were made for him to be treated with a biologic agent for adenocarcinoma. However, he was unable to be treated with this because of progressive dyspnea. He states the dyspnea has been progressive at home over the last several weeks. He has undergone CT scans of his chest which have not shown evidence of a pulmonary embolism. However, on the most recent CT chest he was found to have increasing groundglass and interlobular septal thickening in the bases of the lungs. Of note, he saw my partner in January 2016 and had pulmonary function tests which only showed minimal airflow obstruction. There was a notable decreased DLCO. This was performed in the setting of anemia. He was  instructed to continue using his Symbicort. He came to the hospital on 07/21/2014 when the dyspnea became profound and he required oxygen to maintain his O2 saturation greater than 92%. He states that he has not had a significant cough recently, his chest pain which is chronic is at baseline. His weight has been dropping steadily over the last several months. He denies fevers or chills. He denies mucus production. He and his family both note that since coming to the hospital his shortness of breath has improved dramatically. He has been treated with antibiotics.  PAST MEDICAL HISTORY :   has a past medical history of Hypertension; CAD (coronary artery disease); Hyperlipidemia; Hearing loss; AAA (abdominal aortic aneurysm) (11/09); Chronic renal insufficiency; History of radiation therapy (03/08/04- 04/08/04); Myocardial infarction (1995); Pacemaker (2014); Dysrhythmia; Shortness of breath; Asthma; COPD (chronic obstructive pulmonary disease); Cancer (2015); Prostate cancer (2005); Skin cancer; Lung cancer; and Lung cancer.  has past surgical history that includes Mitral valve replacement (2006); Coronary stent placement; Permanent pacemaker insertion (Bilateral, 02/26/13); Radioactive seed implant (05/04/2004); Elbow surgery (Bilateral, 1973); Insert / replace / remove pacemaker; Coronary angioplasty; Mediastinoscopy (N/A, 01/28/2014); and permanent pacemaker insertion (N/A, 02/26/2013). Prior to Admission medications   Medication Sig Start Date End Date Taking? Authorizing Provider  albuterol (PROVENTIL,VENTOLIN) 90 MCG/ACT inhaler Inhale 2 puffs into the lungs every 6 (six) hours as needed for wheezing or shortness of breath. 03/21/11  Yes Dorena Cookey, MD  Aspirin-Acetaminophen-Caffeine (GOODY HEADACHE PO) Take 1 packet by mouth daily. As needed   Yes Historical Provider, MD  budesonide-formoterol (SYMBICORT) 160-4.5 MCG/ACT inhaler 1 puff twice daily Patient taking differently: 2 puffs. 2 puffs twice per  day 04/29/14  Yes Dorena Cookey, MD  clopidogrel (PLAVIX) 75 MG tablet Take 1 tablet (75 mg total) by mouth daily. 04/29/14  Yes Dorena Cookey, MD  folic acid (FOLVITE) 1 MG tablet Take 1 tablet (1 mg total) by mouth daily. 01/30/14  Yes Curt Bears, MD  hyaluronate sodium (RADIAPLEXRX) GEL Apply 1 application topically once.   Yes Historical Provider, MD  Multiple Minerals (CALCIUM/MAGNESIUM/ZINC PO) Take 1 capsule by mouth every evening.   Yes Historical Provider, MD  Multiple Vitamin (MULTIVITAMIN) tablet Take 1 tablet by mouth daily.    Yes Historical Provider, MD  naproxen sodium (ANAPROX) 220 MG tablet Take 440 mg by mouth 2 (two) times daily as needed (pain).    Yes Historical Provider, MD  nitroGLYCERIN (NITROSTAT) 0.4 MG SL tablet Place 0.4 mg under the tongue every 5 (five) minutes as needed for chest pain (MAX 3 TABLETS). 06/05/13  Yes Dorena Cookey, MD  oxyCODONE (OXY IR/ROXICODONE) 5 MG immediate release tablet Take 1-2 tablets (5-10 mg total) by mouth every 4 (four) hours as needed for moderate pain. 05/29/14  Yes Curt Bears, MD  prochlorperazine (COMPAZINE) 10 MG tablet Take 1 tablet (10 mg total) by mouth every 6 (six) hours as needed for nausea or vomiting. 01/30/14  Yes Curt Bears, MD  levofloxacin (LEVAQUIN) 500 MG tablet Take 1 tablet (500 mg total) by mouth daily. Patient not taking: Reported on 07/21/2014 07/14/14   Carlton Adam, PA-C  losartan (COZAAR) 50 MG tablet Take 1 tablet (50 mg total) by mouth daily. Patient not taking: Reported on 07/21/2014 04/29/14   Dorena Cookey, MD  sucralfate (CARAFATE) 1 G tablet Take 1 g by mouth 4 (four) times daily -  with meals and at bedtime. Dissolve tablets in 15-30 ml of water before taking.  Take half hour before meals and at bedtime.  Disp. 42 tablets. 0 refills. Called in to [facility] per Dr. Lisbeth Renshaw. 07/11/14   Jodelle Gross, MD  sucralfate (CARAFATE) 1 GM/10ML suspension Take 10 mLs (1 g total) by mouth 4 (four) times  daily -  with meals and at bedtime. Patient not taking: Reported on 07/21/2014 07/08/14   Blair Promise, MD   No Known Allergies  FAMILY HISTORY:  indicated that his mother is deceased. He indicated that his father is deceased.  SOCIAL HISTORY:  reports that he quit smoking about 20 years ago. His smoking use included Cigarettes. He has a 105 pack-year smoking history. He has never used smokeless tobacco. He reports that he does not drink alcohol or use illicit drugs.  REVIEW OF SYSTEMS:   Gen: Denies fever, + chills, weight change, fatigue, night sweats HEENT: Denies blurred vision, double vision, hearing loss, tinnitus, sinus congestion, rhinorrhea, sore throat, neck stiffness, dysphagia PULM: per HPI CV: Denies chest pain, edema, orthopnea, paroxysmal nocturnal dyspnea, palpitations GI: Denies abdominal pain, nausea, vomiting, diarrhea, hematochezia, melena, constipation, change in bowel habits GU: Denies dysuria, hematuria, polyuria, oliguria, urethral discharge Endocrine: Denies hot or cold intolerance, polyuria, polyphagia or appetite change Derm: Denies rash, dry skin, scaling or peeling skin change Heme: Denies easy bruising, bleeding, bleeding gums Neuro: Denies headache, numbness, weakness, slurred speech, loss of memory or consciousness   SUBJECTIVE:   VITAL SIGNS: Temp:  [98.2 F (36.8 C)-98.5 F (36.9 C)] 98.4 F (36.9 C) (02/09 0516) Pulse Rate:  [80-93] 83 (02/09 0516) Resp:  [22-32] 22 (02/09 0516) BP: (112-155)/(64-88) 137/67 mmHg (02/09 0516) SpO2:  [92 %-99 %] 93 % (  02/09 0750) Weight:  [89.359 kg (197 lb)] 89.359 kg (197 lb) (02/08 1512) HEMODYNAMICS:   VENTILATOR SETTINGS:   INTAKE / OUTPUT:  Intake/Output Summary (Last 24 hours) at 07/22/14 1119 Last data filed at 07/22/14 0900  Gross per 24 hour  Intake    360 ml  Output    800 ml  Net   -440 ml    PHYSICAL EXAMINATION: Gen: well appearing, no acute distress HEENT: NCAT, PERRL, EOMi, OP clear,  neck supple without masses PULM: Crackles in lung bases bilaterally, no wheezing CV: RRR, no mgr, no JVD AB: BS+, soft, nontender, no hsm Ext: warm, no edema, no clubbing, no cyanosis Derm: no rash or skin breakdown Neuro: A&Ox4, CN II-XII intact, strength 5/5 in all 4 extremities  LABS:  CBC  Recent Labs Lab 07/21/14 1444 07/21/14 1821 07/22/14 0118  WBC 2.2* 2.3* 1.7*  HGB 9.4* 9.4* 8.4*  HCT 29.6* 28.9* 26.4*  PLT 121* 110* 96*   Coag's No results for input(s): APTT, INR in the last 168 hours. BMET  Recent Labs Lab 07/21/14 1445 07/21/14 1821 07/22/14 0118  NA 138 135 135  K 3.8 3.8 4.0  CL  --  104 105  CO2 19* 22 25  BUN 14.1 15 15   CREATININE 0.8 0.74 0.73  GLUCOSE 118 104* 125*   Electrolytes  Recent Labs Lab 07/21/14 1445 07/21/14 1821 07/22/14 0118  CALCIUM 9.2 8.9 8.8   Sepsis Markers  Recent Labs Lab 07/21/14 2237 07/22/14 0118  LATICACIDVEN 1.2 0.9   ABG No results for input(s): PHART, PCO2ART, PO2ART in the last 168 hours. Liver Enzymes  Recent Labs Lab 07/21/14 1445 07/21/14 1821  AST 40* 43*  ALT 37 37  ALKPHOS 73 66  BILITOT 0.96 1.2  ALBUMIN 2.6* 3.0*   Cardiac Enzymes  Recent Labs Lab 07/21/14 1821  TROPONINI <0.03   Glucose No results for input(s): GLUCAP in the last 168 hours.  Imaging Dg Chest Port 1 View  07/21/2014   CLINICAL DATA:  Chronic shortness of breath for 3 months. Hypertension. History of metastatic lung cancer.  EXAM: PORTABLE CHEST - 1 VIEW  COMPARISON:  07/14/2014, 06/02/2014  FINDINGS: Left subclavian 2 lead pacer noted. Normal heart size and vascularity. Worsening mid and lower lung patchy airspace process compatible with superimposed pneumonia. No effusion or pneumothorax. Trachea is midline. Atherosclerosis noted of the aorta. Chronic left posterior medial diaphragmatic hernia.  IMPRESSION: Right greater than left bibasilar streaky airspace process concerning for superimposed pneumonia.    Electronically Signed   By: Daryll Brod M.D.   On: 07/21/2014 18:41     ASSESSMENT / PLAN:  Impression: #1 acute hypoxemic respiratory failure #2 pulmonary infiltrates #3 stage IV lung cancer #4 likely healthcare associated pneumonia  Discussion: I have reviewed the patient's pulmonary function test and multiple imaging studies from the last several weeks. While the RLL changes could represent pneumonia, suggested by his positive response to antibiotics, he does not have the typical cough, mucus production, fever, or chills that one would expect with pneumonia. Further, this was more of an insidious onset and would be anticipated for pneumonia.  The differential diagnosis here includes lymphangitic spread of malignancy (he does have interlobular septal thickening in the right lung as well as some groundglass changes which could be consistent with this) versus less likely a pneumonitis of some sort. I do not believe that the right lower lobe changes are consistent with radiation pneumonitis considering the right upper lobe was radiated.  It is not entirely clear to me that diagnosing lymphangitic spread by transbronchial biopsies would change our management, welcome Oncology input.   Plan: 1) continue antibiotic therapy as you are doing 2) Wean O2 for O2 saturation > 90% 3) Ambulate as able  4) Repeat CXR in AM 5) If he continues to improve with antibiotics, then would recommend completing treatment with a seven-day course 6) If no improvement in 24-48 hours then consider bronchoscopy for further evaluation versus starting treatment with anticancer therapy sooner rather than later for presumed lymphangitic spread of his malignancy.   Pulmonary and [place] Pager: [phone]  07/22/2014, 11:19 AM

## 2014-07-22 NOTE — Telephone Encounter (Signed)
per pof to r/s pt infu to after MD-sent MW email to sch-9:30 was the time-cld pt and adv appt for MD moved up to be here @ 8:45 for lab w/MD & infus to follow

## 2014-07-22 NOTE — Evaluation (Signed)
Physical Therapy Evaluation Patient Details Name: Nicholas Schmitt MRN: [id number] DOB: March 24, 1936 Today's Date: 07/22/2014   History of Present Illness  Nicholas Schmitt is a 79 y.o. male with past medical history of coronary artery disease (post status of stent placement), pacemaker placement, hypertension, AAA (3.2 cm x 3.4 cm by ultrasound on 04/09/14), chronic kidney disease, history of prostate cancer (post status of radiation therapy 2005), asthma, COPD, skin cancer, lung cancer, who presents with worsening shortness breath.  Clinical Impression  Eval limited, getting ready to eat. Patient will benefit from PT to address problems listed in note below.    Follow Up Recommendations Home health PT;Supervision/Assistance - 24 hour    Equipment Recommendations  Rolling walker with 5" wheels    Recommendations for Other Services       Precautions / Restrictions Precautions Precautions: Fall Precaution Comments: droplet      Mobility  Bed Mobility Overal bed mobility: Needs Assistance Bed Mobility: Supine to Sit     Supine to sit: Supervision        Transfers                 General transfer comment: partially stood for bed pad to be changed.  Ambulation/Gait             General Gait Details: NT  Stairs            Wheelchair Mobility    Modified Rankin (Stroke Patients Only)       Balance                                             Pertinent Vitals/Pain Pain Assessment: No/denies pain    Home Living Family/patient expects to be discharged to:: Private residence Living Arrangements: Alone Available Help at Discharge: Family;Friend(s) Type of Home: House Home Access: Stairs to enter Entrance Stairs-Rails: Right Entrance Stairs-Number of Steps: 4 Home Layout: One level Home Equipment: Cane - single point      Prior Function Level of Independence: Independent with assistive device(s)         Comments: uses  cane for ambulation     Hand Dominance        Extremity/Trunk Assessment               Lower Extremity Assessment: Generalized weakness      Cervical / Trunk Assessment: Normal  Communication   Communication: HOH  Cognition Arousal/Alertness: Awake/alert Behavior During Therapy: Impulsive Overall Cognitive Status: Within Functional Limits for tasks assessed                      General Comments      Exercises        Assessment/Plan    PT Assessment Patient needs continued PT services  PT Diagnosis Difficulty walking;Generalized weakness   PT Problem List Decreased strength;Decreased activity tolerance;Decreased mobility;Decreased knowledge of precautions;Decreased safety awareness;Decreased knowledge of use of DME;Cardiopulmonary status limiting activity  PT Treatment Interventions DME instruction;Gait training;Stair training;Functional mobility training;Therapeutic activities;Therapeutic exercise;Patient/family education   PT Goals (Current goals can be found in the Care Plan section) Acute Rehab PT Goals Patient Stated Goal: to get stronger PT Goal Formulation: With patient/family Time For Goal Achievement: 08/05/14 Potential to Achieve Goals: Good    Frequency Min 3X/week   Barriers to discharge        Co-evaluation  End of Session   Activity Tolerance: Patient limited by fatigue Patient left: in bed;with call bell/phone within reach;with family/visitor present (family at bedside, pt sitting on the edge for dinner.) Nurse Communication: Mobility status         Time: 6579-0383 PT Time Calculation (min) (ACUTE ONLY): 7 min   Charges:   PT Evaluation $Initial PT Evaluation Tier I: 1 Procedure     PT G CodesClaretha Cooper 07/22/2014, 6:19 PM

## 2014-07-22 NOTE — Progress Notes (Signed)
[facility] PROGRESS NOTE  Nicholas Schmitt SEG:[id number] DOB: [birth year] DOA: 07/21/2014 PCP: [name], MD  Assessment/Plan: SOB: Patient's chronic shortness breath is likely due to multifactorial, including COPD, lung cancer, asthma. His worsening shortness of breath in the past 2 weeks is likely due to pneumonia as evidenced by possible superimposed pneumonia on chest x-ray.  - Pulmonology consulted and will assist with management. - Follow up blood culture x2, sputum culture and respiratory virus panel - continue cefepime and vancomcycin  Non-small cell lung cancer, stage IV: Patient was diagnosed 01/2014. He has been followed up by Dr. Julien Nordmann. He completed his 6 rounds of chemotherapy, and 16 cycles of radiation therapy 2 weeks ago. - Dr. Julien Nordmann on board.  COPD and asthma: No signs of acute exacerbation. Patient has very mild wheezing on lung auscultation. -Continue home breathing treatment: Symbicort inhaler, inhaler  HTN:  -Currently on Cozaar: 25 mg daily  Hx of Prostate cancer: s/p of radiation therapy 2005. PSA was 4.59 on 04/15/14. -Follow-up with [name] as outpatient after discharge  CAD and Sick sinus syndrome: s/p of stent and pacemaker placement. No chest pain. HR is well controlled. -continue plavix -PRN NTG  Code Status: partial Family Communication:  Discussed directly with patient Disposition Plan: pending further improvement in respiratory condition.   Consultants:  Pulmonology  Oncology  Procedures:  none  Antibiotics:  Cefepime and vancomycin   HPI/Subjective: Patient currently feeling better.  Objective: Filed Vitals:   07/22/14 1320  BP: 109/72  Pulse: 88  Temp: 97.9 F (36.6 C)  Resp: 20    Intake/Output Summary (Last 24 hours) at 07/22/14 1534 Last data filed at 07/22/14 1230  Gross per 24 hour  Intake    600 ml  Output   1200 ml  Net   -600 ml   There were no vitals filed for this  visit.  Exam:   General:  Patient in no acute distress, alert and awake  Cardiovascular: No cyanosis  Respiratory: Supplemental oxygen on board, no wheezes  Abdomen: Soft, nondistended, nontender  Musculoskeletal: No cyanosis or clubbing   Data Reviewed: Basic Metabolic Panel:  Recent Labs Lab 07/21/14 1445 07/21/14 1821 07/22/14 0118  NA 138 135 135  K 3.8 3.8 4.0  CL  --  104 105  CO2 19* 22 25  GLUCOSE 118 104* 125*  BUN 14.1 15 15   CREATININE 0.8 0.74 0.73  CALCIUM 9.2 8.9 8.8   Liver Function Tests:  Recent Labs Lab 07/21/14 1445 07/21/14 1821  AST 40* 43*  ALT 37 37  ALKPHOS 73 66  BILITOT 0.96 1.2  PROT 6.5 6.8  ALBUMIN 2.6* 3.0*   No results for input(s): LIPASE, AMYLASE in the last 168 hours. No results for input(s): AMMONIA in the last 168 hours. CBC:  Recent Labs Lab 07/21/14 1444 07/21/14 1821 07/22/14 0118  WBC 2.2* 2.3* 1.7*  NEUTROABS 1.6 1.6* 1.1*  HGB 9.4* 9.4* 8.4*  HCT 29.6* 28.9* 26.4*  MCV 89.9 91.5 92.3  PLT 121* 110* 96*   Cardiac Enzymes:  Recent Labs Lab 07/21/14 1821  TROPONINI <0.03   BNP (last 3 results)  Recent Labs  07/21/14 1821  BNP 39.7    ProBNP (last 3 results) No results for input(s): PROBNP in the last 8760 hours.  CBG: No results for input(s): GLUCAP in the last 168 hours.  No results found for this or any previous visit (from the past 240 hour(s)).   Studies: Dg Chest Port 1 View  07/21/2014  CLINICAL DATA:  Chronic shortness of breath for 3 months. Hypertension. History of metastatic lung cancer.  EXAM: PORTABLE CHEST - 1 VIEW  COMPARISON:  07/14/2014, 06/02/2014  FINDINGS: Left subclavian 2 lead pacer noted. Normal heart size and vascularity. Worsening mid and lower lung patchy airspace process compatible with superimposed pneumonia. No effusion or pneumothorax. Trachea is midline. Atherosclerosis noted of the aorta. Chronic left posterior medial diaphragmatic hernia.  IMPRESSION: Right  greater than left bibasilar streaky airspace process concerning for superimposed pneumonia.   Electronically Signed   By: Daryll Brod M.D.   On: 07/21/2014 18:41    Scheduled Meds: . budesonide-formoterol  2 puff Inhalation BID  . ceFEPime (MAXIPIME) IV  2 g Intravenous 3 times per day  . clopidogrel  75 mg Oral Daily  . dextromethorphan-guaiFENesin  1 tablet Oral BID  . folic acid  1 mg Oral Daily  . heparin  5,000 Units Subcutaneous 3 times per day  . hyaluronate sodium  1 application Topical Once  . losartan  25 mg Oral Daily  . multivitamin with minerals  1 tablet Oral Daily  . sucralfate  1 g Oral TID WC & HS  . 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

## 2014-07-22 NOTE — Progress Notes (Signed)
[place] for Vancomycin, pharmacy may adjust abx Indication: pneumonia  No Known Allergies  Patient Measurements:   Total body weight: 89.4 kg  Vital Signs: Temp: 97.9 F (36.6 C) (02/09 1320) Temp Source: Oral (02/09 1320) BP: 109/72 mmHg (02/09 1320) Pulse Rate: 88 (02/09 1320) Intake/Output from previous day: 02/08 0701 - 02/09 0700 In: -  Out: 500 [Urine:500] Intake/Output from this shift: Total I/O In: 600 [P.O.:600] Out: 700 [Urine:700]  Labs:  Recent Labs  07/21/14 1444 07/21/14 1445 07/21/14 1821 07/22/14 0118  WBC 2.2*  --  2.3* 1.7*  HGB 9.4*  --  9.4* 8.4*  PLT 121*  --  110* 96*  CREATININE  --  0.8 0.74 0.73   Estimated Creatinine Clearance: 83.7 mL/min (by C-G formula based on Cr of 0.73). No results for input(s): VANCOTROUGH, VANCOPEAK, VANCORANDOM, GENTTROUGH, GENTPEAK, GENTRANDOM, TOBRATROUGH, TOBRAPEAK, TOBRARND, AMIKACINPEAK, AMIKACINTROU, AMIKACIN in the last 72 hours.   Microbiology: No results found for this or any previous visit (from the past 720 hour(s)).  Medical History: Past Medical History  Diagnosis Date  . Hypertension   . CAD (coronary artery disease)     positive stress test in 2006 led to left heart cath (8/06) showing 95% prox RCA, 90% CFX, and 90% mLAD.  patient had a cypher DES to all 3 lesions  . Hyperlipidemia   . Hearing loss   . AAA (abdominal aortic aneurysm) 11/09    3.2 cm   . Chronic renal insufficiency   . History of radiation therapy 03/08/04- 04/08/04    prostate 4600 cGy in 3 fractions, radioactive seed implant 05/04/04  . Myocardial infarction 1995  . Pacemaker 2014  . Dysrhythmia   . Shortness of breath     exertion  . Asthma     bronchial  . COPD (chronic obstructive pulmonary disease)   . Cancer 2015    prostate   . Prostate cancer 2005    gleason 7  . Skin cancer     ear  . Lung cancer   . Lung cancer    Medications:  Anti-infectives    Start      Dose/Rate Route Frequency Ordered Stop   07/22/14 1500  ceFEPIme (MAXIPIME) 2 g in dextrose 5 % 50 mL IVPB     2 g100 mL/hr over 30 Minutes Intravenous 3 times per day 07/22/14 1430 07/29/14 2159   07/21/14 2245  vancomycin (VANCOCIN) 1,250 mg in sodium chloride 0.9 % 250 mL IVPB     1,250 mg166.7 mL/hr over 90 Minutes Intravenous 2 times daily 07/21/14 2240     07/21/14 2230  ceFEPIme (MAXIPIME) 1 g in dextrose 5 % 50 mL IVPB  Status:  Discontinued     1 g100 mL/hr over 30 Minutes Intravenous 3 times per day 07/21/14 2217 07/22/14 1430   07/21/14 1930  cefTRIAXone (ROCEPHIN) 1 g in dextrose 5 % 50 mL IVPB     1 g100 mL/hr over 30 Minutes Intravenous  Once 07/21/14 1929 07/21/14 2044   07/21/14 1930  azithromycin (ZITHROMAX) 500 mg in dextrose 5 % 250 mL IVPB  Status:  Discontinued     500 mg250 mL/hr over 60 Minutes Intravenous  Once 07/21/14 1929 07/22/14 1416     Assessment: Nicholas Schmitt is a 79 y.o. male with past medical history of coronary artery disease (post status of stent placement), pacemaker placement, hypertension, AAA (3.2 cm x 3.4 cm by ultrasound on 04/09/14), chronic kidney disease, history  of prostate cancer (post status of radiation therapy 2005), asthma, COPD, skin cancer,Stage IV lung cancer, who presents with worsening shortness breath.    WBC decreasing further2.2-> 1.7K, ANC 1.1  Cefepime ordered 1gm q8hr  Goal of Therapy:  Vancomycin trough level 15-20 mcg/ml  Plan:  Measure antibiotic drug levels at steady state Follow up culture results Vancomycin 1250mg  iv q12hr  Increase Cefepime to 2gm q8hr  Minda Ditto PharmD Pager [phone] 07/22/2014, 2:38 PM

## 2014-07-22 NOTE — Telephone Encounter (Signed)
per pof to sch pt appt-sent MW email to sch pt trmt-pt aware

## 2014-07-22 NOTE — Progress Notes (Signed)
ANTIBIOTIC CONSULT NOTE - INITIAL  Pharmacy Consult for vancomycin Indication: pneumonia  No Known Allergies  Patient Measurements:   Adjusted Body Weight:   Vital Signs: Temp: 98.4 F (36.9 C) (02/09 0516) Temp Source: Oral (02/09 0516) BP: 137/67 mmHg (02/09 0516) Pulse Rate: 83 (02/09 0516) Intake/Output from previous day: 02/08 0701 - 02/09 0700 In: -  Out: 500 [Urine:500] Intake/Output from this shift: Total I/O In: -  Out: 500 [Urine:500]  Labs:  Recent Labs  07/21/14 1444 07/21/14 1445 07/21/14 1821 07/22/14 0118  WBC 2.2*  --  2.3* 1.7*  HGB 9.4*  --  9.4* 8.4*  PLT 121*  --  110* 96*  CREATININE  --  0.8 0.74 0.73   Estimated Creatinine Clearance: 83.7 mL/min (by C-G formula based on Cr of 0.73). No results for input(s): VANCOTROUGH, VANCOPEAK, VANCORANDOM, GENTTROUGH, GENTPEAK, GENTRANDOM, TOBRATROUGH, TOBRAPEAK, TOBRARND, AMIKACINPEAK, AMIKACINTROU, AMIKACIN in the last 72 hours.   Microbiology: No results found for this or any previous visit (from the past 720 hour(s)).  Medical History: Past Medical History  Diagnosis Date  . Hypertension   . CAD (coronary artery disease)     positive stress test in 2006 led to left heart cath (8/06) showing 95% prox RCA, 90% CFX, and 90% mLAD.  patient had a cypher DES to all 3 lesions  . Hyperlipidemia   . Hearing loss   . AAA (abdominal aortic aneurysm) 11/09    3.2 cm   . Chronic renal insufficiency   . History of radiation therapy 03/08/04- 04/08/04    prostate 4600 cGy in 3 fractions, radioactive seed implant 05/04/04  . Myocardial infarction 1995  . Pacemaker 2014  . Dysrhythmia   . Shortness of breath     exertion  . Asthma     bronchial  . COPD (chronic obstructive pulmonary disease)   . Cancer 2015    prostate   . Prostate cancer 2005    gleason 7  . Skin cancer     ear  . Lung cancer   . Lung cancer     Medications:  Anti-infectives    Start     Dose/Rate Route Frequency Ordered Stop    07/21/14 2245  vancomycin (VANCOCIN) 1,250 mg in sodium chloride 0.9 % 250 mL IVPB     1,250 mg166.7 mL/hr over 90 Minutes Intravenous 2 times daily 07/21/14 2240     07/21/14 2230  ceFEPIme (MAXIPIME) 1 g in dextrose 5 % 50 mL IVPB     1 g100 mL/hr over 30 Minutes Intravenous 3 times per day 07/21/14 2217 07/29/14 2159   07/21/14 1930  cefTRIAXone (ROCEPHIN) 1 g in dextrose 5 % 50 mL IVPB     1 g100 mL/hr over 30 Minutes Intravenous  Once 07/21/14 1929 07/21/14 2044   07/21/14 1930  azithromycin (ZITHROMAX) 500 mg in dextrose 5 % 250 mL IVPB     500 mg250 mL/hr over 60 Minutes Intravenous  Once 07/21/14 1929       Assessment: Nicholas Schmitt is a 79 y.o. male with past medical history of coronary artery disease (post status of stent placement), pacemaker placement, hypertension, AAA (3.2 cm x 3.4 cm by ultrasound on 04/09/14), chronic kidney disease, history of prostate cancer (post status of radiation therapy 2005), asthma, COPD, skin cancer, lung cancer, who presents with worsening shortness breath.    Goal of Therapy:  Vancomycin trough level 15-20 mcg/ml  Plan:  Measure antibiotic drug levels at steady state Follow up culture  results Vancomycin 1250mg  iv q12hr  Nani Skillern Crowford 07/22/2014,6:15 AM

## 2014-07-22 NOTE — Progress Notes (Signed)
DIAGNOSIS: Non-small cell carcinoma of lung, stage 4  Primary site: Lung (Right)  Staging method: AJCC 7th Edition  Clinical: Stage IV (T1a, N3, M1b) signed by Curt Bears, MD on 02/01/2014 1:42 PM  Summary: Stage IV (T1a, N3, M1b) Prostate cancer  Primary site: Prostate  Clinical: Stage I (T1c, NX, M0) signed by Wyatt Portela, MD on 08/06/2013 1:59 PM  Summary: Stage I (T1c, NX, M0)  PRIOR THERAPY: Systemic chemotherapy with carboplatin for an AUC of 5 and Alimta 500 mg per meter squared given every 3 weeks. Status post 6 cycles, last dose was given 05/20/2014 discontinued secondary to disease progression.  CURRENT THERAPY: Immunotherapy with Keytruda (pembrolizumab) 2 MG/KG every 3 weeks. He has not received the first dose yet. He has positive PDL 1 expression (90%).  Subjective: The patient is seen and examined today. He was admitted with persistent dyspnea for the last 3-4 weeks. He had several studies performed recently including repeat CT angiogram of the chest twice in one month's that showed no significant pulmonary embolism. There was some questionable inflammatory process and the patient was treated on outpatient basis with 1 week of Levaquin with no improvement in his condition. He was supposed to start treatment with immunotherapy with Nat Math (pembrolizumab) but this was delayed because of his persistent dyspnea. He was admitted to the hospital last night for evaluation and inpatient treatment of questionable pneumonia. He is feeling a little bit better today. He denied having any significant fever or chills.  Objective: Vital signs in last 24 hours: Temp:  [97.9 F (36.6 C)-98.4 F (36.9 C)] 97.9 F (36.6 C) (02/09 1320) Pulse Rate:  [82-88] 88 (02/09 1320) Resp:  [20-29] 20 (02/09 1320) BP: (109-155)/(67-75) 109/72 mmHg (02/09 1320) SpO2:  [92 %-99 %] 99 % (02/09 1320)  Intake/Output from previous day: 02/08 0701 - 02/09 0700 In: -  Out: 500  [Urine:500] Intake/Output this shift: Total I/O In: 600 [P.O.:600] Out: 700 [Urine:700]  General appearance: alert, cooperative, fatigued and no distress Resp: wheezes bilaterally Cardio: regular rate and rhythm, S1, S2 normal, no murmur, click, rub or gallop GI: soft, non-tender; bowel sounds normal; no masses,  no organomegaly Extremities: extremities normal, atraumatic, no cyanosis or edema  Lab Results:   Recent Labs  07/21/14 1821 07/22/14 0118  WBC 2.3* 1.7*  HGB 9.4* 8.4*  HCT 28.9* 26.4*  PLT 110* 96*   BMET  Recent Labs  07/21/14 1821 07/22/14 0118  NA 135 135  K 3.8 4.0  CL 104 105  CO2 22 25  GLUCOSE 104* 125*  BUN 15 15  CREATININE 0.74 0.73  CALCIUM 8.9 8.8    Studies/Results: Dg Chest Port 1 View  07/21/2014   CLINICAL DATA:  Chronic shortness of breath for 3 months. Hypertension. History of metastatic lung cancer.  EXAM: PORTABLE CHEST - 1 VIEW  COMPARISON:  07/14/2014, 06/02/2014  FINDINGS: Left subclavian 2 lead pacer noted. Normal heart size and vascularity. Worsening mid and lower lung patchy airspace process compatible with superimposed pneumonia. No effusion or pneumothorax. Trachea is midline. Atherosclerosis noted of the aorta. Chronic left posterior medial diaphragmatic hernia.  IMPRESSION: Right greater than left bibasilar streaky airspace process concerning for superimposed pneumonia.   Electronically Signed   By: Daryll Brod M.D.   On: 07/21/2014 18:41    Medications: I have reviewed the patient's current medications.  Assessment/Plan: 1) Stage IV non-small cell lung cancer, adenocarcinoma status post 6 cycle of induction chemotherapy with carboplatin and Alimta with evidence for  disease progression after cycle #6. This was followed by palliative radiotherapy to the mid back and right side of the chest. He was supposed to start the first cycle of immunotherapy with Ketruda (pembrolizumab) after improvement of his dyspnea. 2) persistent  dyspnea: This is multifactorial including questionable pneumonia plus pneumonitis from the recent radiation therapy in addition to his disease. The patient will continue with the current IV antibiotics with cefepime and vancomycin. He would also continue with the treatment for COPD. I would recommend consulting pulmonary medicine for evaluating the patient because of the persistent dyspnea. 3) hypertension: Continue his current home medication. Thank you so much for taking good care of Nicholas Schmitt, I will continue to follow up the patient with you and assist in his management on as-needed basis.   LOS: 1 day    Cadence Minton K. 07/22/2014

## 2014-07-22 NOTE — Progress Notes (Signed)
Occupational Therapy Evaluation Patient Details Name: RAMIR MALERBA MRN: [id number] DOB: Apr 05, 1936 Today's Date: 07/22/2014    History of Present Illness BENTLEE BENNINGFIELD is a 79 y.o. male with past medical history of coronary artery disease (post status of stent placement), pacemaker placement, hypertension, AAA (3.2 cm x 3.4 cm by ultrasound on 04/09/14), chronic kidney disease, history of prostate cancer (post status of radiation therapy 2005), asthma, COPD, skin cancer, lung cancer, who presents with worsening shortness breath.   Clinical Impression   Patient on 2 L O2 via [place]. SpO2 91% pre activity, during ambulation/ADLs decreased to 86% on 2 L O2 via [place]. Improved back to 90% after 2-3 minutes of rest. Patient will benefit from skilled OT for energy conservation education and DME needs for home. Will follow.    Follow Up Recommendations  Supervision - Intermittent;No OT follow up    Equipment Recommendations  Tub/shower seat    Recommendations for Other Services       Precautions / Restrictions Precautions Precautions: Other (comment) (monitor O2 sats) Restrictions Weight Bearing Restrictions: No      Mobility Bed Mobility               General bed mobility comments: NT -- up in recliner  Transfers Overall transfer level: Needs assistance Equipment used: 1 person hand held assist Transfers: Sit to/from Stand Sit to Stand: Min guard              Balance                                            ADL Overall ADL's : Needs assistance/impaired Eating/Feeding: Independent;Sitting   Grooming: Wash/dry hands;Wash/dry face;Supervision/safety;Sitting;Standing   Upper Body Bathing: Set up;Sitting   Lower Body Bathing: Minimal assistance;Sit to/from stand   Upper Body Dressing : Minimal assistance;Sitting   Lower Body Dressing: Minimal assistance;Sit to/from stand   Toilet Transfer: Nature conservation officer;Ambulation            Functional mobility during ADLs: Min guard;+2 for safety/equipment ((chair follow)) General ADL Comments: Patient reports he has been getting SOB with ADLs. Began energy conservation education with patient. Also discussed possibly getting a shower chair for energy conservation. Patient will think about it. Son and girlfriend present and supportive. O2 sats dropped to 86% with ambulation on 2L O2, back up to 90% after 2-3 minutes of rest. Son reports concerns about patient having O2 at home because patient has and uses a wood burning stove. Patient was able to ambulate 150 ft in halls with chair follow. No rest breaks needed.     Vision                     Perception     Praxis      Pertinent Vitals/Pain Pain Assessment: 0-10 Pain Score: 3  Pain Location: back Pain Descriptors / Indicators: Aching Pain Intervention(s): Premedicated before session     Hand Dominance Right   Extremity/Trunk Assessment Upper Extremity Assessment Upper Extremity Assessment: Overall WFL for tasks assessed   Lower Extremity Assessment Lower Extremity Assessment: Overall WFL for tasks assessed   Cervical / Trunk Assessment Cervical / Trunk Assessment: Normal   Communication Communication Communication: HOH   Cognition Arousal/Alertness: Awake/alert Behavior During Therapy: WFL for tasks assessed/performed Overall Cognitive Status: Within Functional Limits for tasks assessed  General Comments       Exercises       Shoulder Instructions      Home Living Family/patient expects to be discharged to:: Private residence Living Arrangements: Alone   Type of Home: House Home Access: Stairs to enter Technical brewer of Steps: 4 Entrance Stairs-Rails: Right Home Layout: One level     Bathroom Shower/Tub: Teacher, Drelyn Pistilli years/pre: Standard     Home Equipment: Cane - single point          Prior Functioning/Environment Level of  Independence: Independent with assistive device(s)        Comments: uses cane for ambulation    OT Diagnosis: Generalized weakness   OT Problem List: Decreased activity tolerance;Decreased knowledge of use of DME or AE;Cardiopulmonary status limiting activity   OT Treatment/Interventions: Self-care/ADL training;Energy conservation;DME and/or AE instruction;Therapeutic activities;Patient/family education    OT Goals(Current goals can be found in the care plan section) Acute Rehab OT Goals Patient Stated Goal: to be able to play golf OT Goal Formulation: With patient Time For Goal Achievement: 07/29/14 Potential to Achieve Goals: Good  OT Frequency: Min 2X/week   Barriers to D/C:            Co-evaluation              End of Session Equipment Utilized During Treatment: Oxygen Nurse Communication: Mobility status  Activity Tolerance: Patient tolerated treatment well Patient left: in chair;with call bell/phone within reach;with family/visitor present   Time: 9476-5465 OT Time Calculation (min): 30 min Charges:  OT General Charges $OT Visit: 1 Procedure OT Evaluation $Initial OT Evaluation Tier I: 1 Procedure OT Treatments $Self Care/Home Management : 8-22 mins G-Codes:    Ehsan Corvin A 07/30/2014, 10:40 AM

## 2014-07-23 ENCOUNTER — Telehealth: Payer: Self-pay | Admitting: *Deleted

## 2014-07-23 ENCOUNTER — Inpatient Hospital Stay (HOSPITAL_COMMUNITY): Payer: Medicare Other

## 2014-07-23 DIAGNOSIS — D630 Anemia in neoplastic disease: Secondary | ICD-10-CM

## 2014-07-23 DIAGNOSIS — J9601 Acute respiratory failure with hypoxia: Secondary | ICD-10-CM | POA: Insufficient documentation

## 2014-07-23 DIAGNOSIS — D61810 Antineoplastic chemotherapy induced pancytopenia: Secondary | ICD-10-CM | POA: Insufficient documentation

## 2014-07-23 DIAGNOSIS — T451X5A Adverse effect of antineoplastic and immunosuppressive drugs, initial encounter: Secondary | ICD-10-CM

## 2014-07-23 MED ORDER — METHYLPREDNISOLONE SODIUM SUCC 125 MG IJ SOLR
80.0000 mg | Freq: Two times a day (BID) | INTRAMUSCULAR | Status: AC
  Administered 2014-07-23 – 2014-07-24 (×3): 80 mg via INTRAVENOUS
  Filled 2014-07-23 (×4): qty 1.28

## 2014-07-23 NOTE — Plan of Care (Signed)
Problem: Phase I Progression Outcomes Goal: OOB as tolerated unless otherwise ordered Outcome: Progressing OOB in the recliner chair.

## 2014-07-23 NOTE — Progress Notes (Signed)
PULMONARY / CRITICAL CARE MEDICINE   Name: Nicholas Schmitt MRN: [id number] DOB: December 02, 1935    ADMISSION DATE:  07/21/2014 CONSULTATION DATE:  07/22/2014  REFERRING MD :  Julien Nordmann  CHIEF COMPLAINT:  Shortness of breath  INITIAL PRESENTATION: 79 year old male with stage IV lung cancer was admitted on 07/21/2014 with acute hypoxemic respiratory failure in the setting of a right lower lobe infiltrate.  STUDIES:  06/17/2014 images reviewed by me> mild paraseptal and centrilobular emphysema, pulmonary nodule slightly increased 07/14/2014 CT chest> pulmonary nodules are grossly stable but there is increased groundglass and interlobular septal thickening in the bases of the lungs bilaterally, right greater than left  SIGNIFICANT EVENTS:   SUBJECTIVE:  Not really feeling better   VITAL SIGNS: Temp:  [98 F (36.7 C)-99.9 F (37.7 C)] 98 F (36.7 C) (02/10 1047) Pulse Rate:  [89-90] 89 (02/10 0513) Resp:  [20] 20 (02/10 0513) BP: (108-137)/(58-72) 108/72 mmHg (02/10 0513) SpO2:  [91 %-95 %] 95 % (02/10 0950) HEMODYNAMICS:   VENTILATOR SETTINGS:   INTAKE / OUTPUT:  Intake/Output Summary (Last 24 hours) at 07/23/14 1329 Last data filed at 07/23/14 1143  Gross per 24 hour  Intake    480 ml  Output   2925 ml  Net  -2445 ml    PHYSICAL EXAMINATION: Gen: well appearing, no acute distress HEENT: NCAT, PERRL, EOMi, OP clear, neck supple without masses PULM: Crackles in lung bases bilaterally, no wheezing, no accessory muscle use  CV: RRR, no mgr, no JVD AB: BS+, soft, nontender, no hsm Ext: warm, no edema, no clubbing, no cyanosis Derm: no rash or skin breakdown Neuro: A&Ox4, CN II-XII intact, strength 5/5 in all 4 extremities  LABS:  CBC  Recent Labs Lab 07/21/14 1444 07/21/14 1821 07/22/14 0118  WBC 2.2* 2.3* 1.7*  HGB 9.4* 9.4* 8.4*  HCT 29.6* 28.9* 26.4*  PLT 121* 110* 96*   Coag's No results for input(s): APTT, INR in the last 168 hours. BMET  Recent  Labs Lab 07/21/14 1445 07/21/14 1821 07/22/14 0118  NA 138 135 135  K 3.8 3.8 4.0  CL  --  104 105  CO2 19* 22 25  BUN 14.1 15 15   CREATININE 0.8 0.74 0.73  GLUCOSE 118 104* 125*   Electrolytes  Recent Labs Lab 07/21/14 1445 07/21/14 1821 07/22/14 0118  CALCIUM 9.2 8.9 8.8   Sepsis Markers  Recent Labs Lab 07/21/14 2237 07/22/14 0118  LATICACIDVEN 1.2 0.9   ABG No results for input(s): PHART, PCO2ART, PO2ART in the last 168 hours. Liver Enzymes  Recent Labs Lab 07/21/14 1445 07/21/14 1821  AST 40* 43*  ALT 37 37  ALKPHOS 73 66  BILITOT 0.96 1.2  ALBUMIN 2.6* 3.0*   Cardiac Enzymes  Recent Labs Lab 07/21/14 1821  TROPONINI <0.03   Glucose No results for input(s): GLUCAP in the last 168 hours.  Imaging No results found.   ASSESSMENT / PLAN:  Impression: #1 acute hypoxemic respiratory failure #2 pulmonary infiltrates #3 stage IV lung cancer #4 likely healthcare associated pneumonia  Discussion:  See Initial consult:   The differential diagnosis here includes lymphangitic spread of malignancy (he does have interlobular septal thickening in the right lung as well as some groundglass changes which could be consistent with this) versus less likely a pneumonitis of some sort. Do not believe that the right lower lobe changes are consistent with radiation pneumonitis considering the right upper lobe was radiated. Also add possibility of superimposed edema  It is not entirely  clear to me that diagnosing lymphangitic spread by transbronchial biopsies would change our management, welcome Oncology input.   Plan: 1) continue antibiotic therapy as you are doing 2) Wean O2 for O2 saturation > 90% 3) Ambulate as able  4) Repeat CXR in AM 5) If he continues to improve with antibiotics, then would recommend completing treatment with a seven-day course 6) If no improvement in 24 hours then consider bronchoscopy for further evaluation versus starting treatment  with anticancer therapy sooner rather than later for presumed lymphangitic spread of his malignancy.  7) get echo    07/23/2014, 1:29 PM   Attending:  I have seen and examined the patient with nurse practitioner/resident and agree with the note above.   No major change today.  Will add solumedrol for possible radiation pneumonitis to see if he gets clinical improvement.  If no improvement by Friday then will perform bronchoscopy.  Would still favor treating cancer sooner rather than later.  Roselie Awkward, MD [facility] Pager: [phone] Cell: [phone] If no response, call [phone]

## 2014-07-23 NOTE — Progress Notes (Signed)
Occupational Therapy Treatment Patient Details Name: Nicholas Schmitt MRN: [id number] DOB: 1936/04/27 Today's Date: 07/23/2014    History of present illness Nicholas Schmitt is a 79 y.o. male with past medical history of coronary artery disease (post status of stent placement), pacemaker placement, hypertension, AAA (3.2 cm x 3.4 cm by ultrasound on 04/09/14), chronic kidney disease, history of prostate cancer (post status of radiation therapy 2005), asthma, COPD, skin cancer, lung cancer, who presents with worsening shortness breath.   OT comments  Pt will need oxygen at home and pt has a wood stove which may be an issue  Follow Up Recommendations  Supervision - Intermittent;Home health OT    Equipment Recommendations  Tub/shower seat           Mobility Bed Mobility Overal bed mobility: Needs Assistance Bed Mobility: Supine to Sit     Supine to sit: Supervision        Transfers Overall transfer level: Needs assistance Equipment used: 1 person hand held assist Transfers: Stand Pivot Transfers Sit to Stand: Supervision                  ADL Overall ADL's : Needs assistance/impaired     Grooming: Wash/dry hands;Wash/dry face;Supervision/safety;Sitting;Standing       Lower Body Bathing: Set up   Upper Body Dressing : Minimal assistance;Sitting   Lower Body Dressing: Minimal assistance;Sit to/from stand   Toilet Transfer: Nature conservation officer;Ambulation   Toileting- Clothing Manipulation and Hygiene: Supervision/safety;Sit to/from stand         General ADL Comments: Pt has girlfriend that will A at home. Pt does have wood burning stove that MD did share may be of concern if pt needs oxygen.        Vision                     Perception     Praxis      Cognition   Behavior During Therapy: WFL for tasks assessed/performed Overall Cognitive Status: Within Functional Limits for tasks assessed                        Extremity/Trunk Assessment               Exercises     Shoulder Instructions       General Comments      Pertinent Vitals/ Pain       Pain Assessment: No/denies pain  Home Living                                          Prior Functioning/Environment              Frequency Min 2X/week     Progress Toward Goals  OT Goals(current goals can now be found in the care plan section)  Progress towards OT goals: Progressing toward goals  Acute Rehab OT Goals Patient Stated Goal: to get stronger OT Goal Formulation: With patient Time For Goal Achievement: 08/06/14 Potential to Achieve Goals: Good  Plan Discharge plan needs to be updated    Co-evaluation                 End of Session Equipment Utilized During Treatment: Oxygen   Activity Tolerance Patient tolerated treatment well   Patient Left in chair;with call bell/phone within reach;with family/visitor present   Nurse Communication  Mobility status        Time: 0920-1000 OT Time Calculation (min): 40 min  Charges: OT General Charges $OT Visit: 1 Procedure OT Treatments $Self Care/Home Management : 38-52 mins  [place] Pulse, Thereasa Parkin 07/23/2014, 10:11 AM

## 2014-07-23 NOTE — Care Management Note (Signed)
CARE MANAGEMENT NOTE 07/23/2014  Patient:  Nicholas Schmitt, Nicholas Schmitt   Account Number:  [id number]  Date Initiated:  07/23/2014  Documentation initiated by:  Marney Doctor  Subjective/Objective Assessment:   78 yo admitted with SOB, stage IV lung cancer     Action/Plan:   From home alone   Anticipated DC Date:  07/27/2014   Anticipated DC Plan:  [place]  CM consult      [facility] Choice  HOME HEALTH   Choice offered to / List presented to:  C-1 Patient        [place] arranged  HH-2 PT  HH-3 OT      Status of service:  In process, will continue to follow Medicare Important Message given?  YES (If response is "NO", the following Medicare IM given date fields will be blank) Date Medicare IM given:  07/23/2014 Medicare IM given by:  Marney Doctor Date Additional Medicare IM given:   Additional Medicare IM given by:    Discharge Disposition:    Per UR Regulation:  Reviewed for med. necessity/level of care/duration of stay  If discussed at [place] of Stay Meetings, dates discussed:    Comments:  07/23/14 Marney Doctor RN,BSN,NCM [id number] Spoke with pt, son's  and pt girlfriend at bedside about DC needs.  PT/OT evals done and HHPT/OT recommended at home. Pt offered choice for [facility] services. Pt and family unsure who they would like to use at this time.  List left with pt and was asked to check back for choice.  Will need MD orders for HHPT/OT, rolling walker and tub bench.  Per NP unsure if pt will need home 02 at this time.  Pt has a wood stove at home.  Per [facility] DME rep pt will need to keep his 02 tank and concentrator at least 10 feet from wood stove if home 02 is needed.  CM will continue to follow.

## 2014-07-23 NOTE — Progress Notes (Signed)
PROGRESS NOTE    Nicholas Schmitt GBT:[id number] DOB: 08/26/1935 DOA: 07/21/2014 PCP: [name], MD  HPI/Brief narrative 79 year old male with stage IV lung cancer was admitted on 07/21/2014 with acute hypoxemic respiratory failure in the setting of a right lower lobe infiltrate.   Assessment/Plan:  1. Dyspnea/acute hypoxic respiratory failure: Probably multifactorial. DD-lymphangitic spread of malignancy, pneumonitis/pneumonia (HCAP) and COPD. Pulmonary consultation and follow-up appreciated-do not believe radiation pneumonitis. Continue antibiotics, oxygen titration. Chest x-ray reported as worsening. If no improvement in 24 hours, pulmonary considering bronchoscopy for further evaluation versus starting anticancer treatment sooner. 2. Stage IV non-small cell lung cancer, adenocarcinoma: Management per oncology-input from 07/22/14 appreciated. 3. Essential hypertension: Controlled. Continue Cozaar. 4. History of prostate cancer, status post radiation treatment. Follows with [name] as outpatient. 5. CAD/sick sinus syndrome: Status post stent and PPM. Asymptomatic. 6. Pancytopenia:? Secondary to cancer treatment-not sure if he received any recently. Follow CBC in a.m. Transfuse if hemoglobin <7 g per DL.   Code Status: Partial Family Communication: Discussed with patient's 2 sons at bedside. Disposition Plan: Home when medically stable.    Consultants:  Pulmonology  Oncology   Procedures:  None  Antibiotics:  Cefepime  Vancomycin  Subjective: States that dyspnea is slightly better than on admission. Cough with minimal white sputum.  Objective: Filed Vitals:   07/23/14 0513 07/23/14 0950 07/23/14 1047 07/23/14 1654  BP: 108/72     Pulse: 89     Temp: 99.1 F (37.3 C)  98 F (36.7 C) 98 F (36.7 C)  TempSrc: Oral  Oral Oral  Resp: 20     SpO2: 91% 95%      Intake/Output Summary (Last 24 hours) at 07/23/14 1730 Last data filed at 07/23/14 1654  Gross per 24 hour  Intake    780 ml  Output   3225 ml  Net  -2445 ml   There were no vitals filed for this visit.   Exam:  General exam: Pleasant elderly male lying comfortably in bed. Respiratory system: Distant breath sounds bilaterally. Crackles in the right base. Otherwise clear to auscultation. No increased work of breathing. Cardiovascular system: S1 & S2 heard, RRR. No JVD, murmurs, gallops, clicks or pedal edema. Gastrointestinal system: Abdomen is nondistended, soft and nontender. Normal bowel sounds heard. Central nervous system: Alert and oriented. No focal neurological deficits. Extremities: Symmetric 5 x 5 power.   Data Reviewed: Basic Metabolic Panel:  Recent Labs Lab 07/21/14 1445 07/21/14 1821 07/22/14 0118  NA 138 135 135  K 3.8 3.8 4.0  CL  --  104 105  CO2 19* 22 25  GLUCOSE 118 104* 125*  BUN 14.1 15 15   CREATININE 0.8 0.74 0.73  CALCIUM 9.2 8.9 8.8   Liver Function Tests:  Recent Labs Lab 07/21/14 1445 07/21/14 1821  AST 40* 43*  ALT 37 37  ALKPHOS 73 66  BILITOT 0.96 1.2  PROT 6.5 6.8  ALBUMIN 2.6* 3.0*   No results for input(s): LIPASE, AMYLASE in the last 168 hours. No results for input(s): AMMONIA in the last 168 hours. CBC:  Recent Labs Lab 07/21/14 1444 07/21/14 1821 07/22/14 0118  WBC 2.2* 2.3* 1.7*  NEUTROABS 1.6 1.6* 1.1*  HGB 9.4* 9.4* 8.4*  HCT 29.6* 28.9* 26.4*  MCV 89.9 91.5 92.3  PLT 121* 110* 96*   Cardiac Enzymes:  Recent Labs Lab 07/21/14 1821  TROPONINI <0.03   BNP (last 3 results) No results for input(s): PROBNP in the last 8760 hours. CBG: No results  for input(s): GLUCAP in the last 168 hours.  Recent Results (from the past 240 hour(s))  Culture, blood (routine x 2)     Status: None (Preliminary result)   Collection Time: 07/21/14  7:57 PM  Result Value Ref Range Status   Specimen Description BLOOD RIGHT HAND  Final   Special Requests BOTTLES DRAWN AEROBIC ONLY 3CC  Final   Culture   Final            BLOOD CULTURE RECEIVED NO GROWTH TO DATE CULTURE WILL BE HELD FOR 5 DAYS BEFORE ISSUING A FINAL NEGATIVE REPORT Performed at [facility]    Report Status PENDING  Incomplete  Culture, blood (routine x 2)     Status: None (Preliminary result)   Collection Time: 07/21/14  7:58 PM  Result Value Ref Range Status   Specimen Description BLOOD RIGHT ARM  Final   Special Requests BOTTLES DRAWN AEROBIC AND ANAEROBIC 5CC  Final   Culture   Final           BLOOD CULTURE RECEIVED NO GROWTH TO DATE CULTURE WILL BE HELD FOR 5 DAYS BEFORE ISSUING A FINAL NEGATIVE REPORT Performed at [facility]    Report Status PENDING  Incomplete          Studies: Dg Chest 2 View  07/23/2014   CLINICAL DATA:  Subsequent encounter for shortness of breath getting worse for the past 8 months, severe today, personal history of pacemaker and myocardial infarction  EXAM: CHEST  2 VIEW  COMPARISON:  07/21/2014  FINDINGS: Heart size and vascular pattern are normal. Two lead cardiac pacer identified with generator over left thorax. Left lung is clear. There is infiltrate over the right medial lung from the level of the hilum to the diaphragm. Infiltrate extends laterally over the costophrenic angle on the right. No significant pleural effusion. When compared to study performed on 07/21/2014, infiltrate is mildly more extensive.  IMPRESSION: Increased slight mid to lower lung zone infiltrate concerning for pneumonia.   Electronically Signed   By: Skipper Cliche M.D.   On: 07/23/2014 12:12   Dg Chest Port 1 View  07/21/2014   CLINICAL DATA:  Chronic shortness of breath for 3 months. Hypertension. History of metastatic lung cancer.  EXAM: PORTABLE CHEST - 1 VIEW  COMPARISON:  07/14/2014, 06/02/2014  FINDINGS: Left subclavian 2 lead pacer noted. Normal heart size and vascularity. Worsening mid and lower lung patchy airspace process compatible with superimposed pneumonia. No effusion or pneumothorax. Trachea is  midline. Atherosclerosis noted of the aorta. Chronic left posterior medial diaphragmatic hernia.  IMPRESSION: Right greater than left bibasilar streaky airspace process concerning for superimposed pneumonia.   Electronically Signed   By: Daryll Brod M.D.   On: 07/21/2014 18:41        Scheduled Meds: . budesonide-formoterol  2 puff Inhalation BID  . ceFEPime (MAXIPIME) IV  2 g Intravenous 3 times per day  . clopidogrel  75 mg Oral Daily  . dextromethorphan-guaiFENesin  1 tablet Oral BID  . folic acid  1 mg Oral Daily  . heparin  5,000 Units Subcutaneous 3 times per day  . hyaluronate sodium  1 application Topical Once  . losartan  25 mg Oral Daily  . multivitamin with minerals  1 tablet Oral Daily  . sucralfate  1 g Oral TID WC & HS  . 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    Vernell Leep, MD, FACP, FHM. [facility] Pager [phone]  If 7PM-7AM, please contact night-coverage [email or web address] Password [email or web address] 07/23/2014, 5:30 PM    LOS: 2 days

## 2014-07-23 NOTE — Telephone Encounter (Signed)
Per staff message and POF I have scheduled appts. Advised scheduler of appts. JMW  

## 2014-07-24 ENCOUNTER — Telehealth: Payer: Self-pay

## 2014-07-24 DIAGNOSIS — D61818 Other pancytopenia: Secondary | ICD-10-CM

## 2014-07-24 LAB — CBC
HCT: 29.9 % — ABNORMAL LOW (ref 39.0–52.0)
Hemoglobin: 9.4 g/dL — ABNORMAL LOW (ref 13.0–17.0)
MCH: 28.9 pg (ref 26.0–34.0)
MCHC: 31.4 g/dL (ref 30.0–36.0)
MCV: 92 fL (ref 78.0–100.0)
Platelets: 120 10*3/uL — ABNORMAL LOW (ref 150–400)
RBC: 3.25 MIL/uL — ABNORMAL LOW (ref 4.22–5.81)
RDW: 17.7 % — AB (ref 11.5–15.5)
WBC: 1.9 10*3/uL — ABNORMAL LOW (ref 4.0–10.5)

## 2014-07-24 LAB — BASIC METABOLIC PANEL
ANION GAP: 11 (ref 5–15)
BUN: 16 mg/dL (ref 6–23)
CHLORIDE: 102 mmol/L (ref 96–112)
CO2: 26 mmol/L (ref 19–32)
Calcium: 9.3 mg/dL (ref 8.4–10.5)
Creatinine, Ser: 0.74 mg/dL (ref 0.50–1.35)
GFR calc Af Amer: >90 mL/min (ref >90)
GFR calc non Af Amer: 87 mL/min — ABNORMAL LOW (ref >90)
GLUCOSE: 172 mg/dL — AB (ref 70–99)
POTASSIUM: 4.1 mmol/L (ref 3.5–5.1)
SODIUM: 139 mmol/L (ref 135–145)

## 2014-07-24 LAB — LEGIONELLA ANTIGEN, URINE

## 2014-07-24 LAB — VANCOMYCIN, TROUGH: Vancomycin Tr: 12.8 ug/mL (ref 10.0–20.0)

## 2014-07-24 MED ORDER — BISACODYL 10 MG RE SUPP
10.0000 mg | Freq: Every day | RECTAL | Status: DC | PRN
Start: 2014-07-24 — End: 2014-07-25

## 2014-07-24 MED ORDER — SENNA 8.6 MG PO TABS
2.0000 | ORAL_TABLET | Freq: Every day | ORAL | Status: DC
  Administered 2014-07-24 – 2014-07-25 (×2): 17.2 mg via ORAL
  Filled 2014-07-24 (×2): qty 2

## 2014-07-24 MED ORDER — PREDNISONE 20 MG PO TABS
30.0000 mg | ORAL_TABLET | Freq: Every day | ORAL | Status: DC
  Administered 2014-07-25: 30 mg via ORAL
  Filled 2014-07-24 (×2): qty 1

## 2014-07-24 NOTE — Progress Notes (Signed)
Echocardiogram 2D Echocardiogram has been performed.  Nicholas Schmitt 07/24/2014, 12:23 PM

## 2014-07-24 NOTE — Progress Notes (Signed)
ANTIBIOTIC CONSULT NOTE: FOLLOW UP  Pharmacy Consult for Vancomycin & Cefepime Indication: HCAP  No Known Allergies  Patient Measurements: Total body weight: 89.4 kg  Vital Signs: Temp: 97.5 F (36.4 C) (02/11 0536) Temp Source: Oral (02/11 0536) BP: 135/76 mmHg (02/11 0536) Pulse Rate: 83 (02/11 0536) Intake/Output from previous day: 02/10 0701 - 02/11 0700 In: 540 [P.O.:240; IV Piggyback:300] Out: 1730 [Urine:1730] Intake/Output from this shift: Total I/O In: 240 [P.O.:240] Out: -   Labs:  Recent Labs  07/21/14 1821 07/22/14 0118 07/24/14 0436  WBC 2.3* 1.7* 1.9*  HGB 9.4* 8.4* 9.4*  PLT 110* 96* 120*  CREATININE 0.74 0.73 0.74   Estimated Creatinine Clearance: 83.7 mL/min (by C-G formula based on Cr of 0.74).  Recent Labs  07/24/14 0953  VANCOTROUGH 12.8     Microbiology: Recent Results (from the past 720 hour(s))  Culture, blood (routine x 2)     Status: None (Preliminary result)   Collection Time: 07/21/14  7:57 PM  Result Value Ref Range Status   Specimen Description BLOOD RIGHT HAND  Final   Special Requests BOTTLES DRAWN AEROBIC ONLY 3CC  Final   Culture   Final           BLOOD CULTURE RECEIVED NO GROWTH TO DATE CULTURE WILL BE HELD FOR 5 DAYS BEFORE ISSUING A FINAL NEGATIVE REPORT Performed at [facility]    Report Status PENDING  Incomplete  Culture, blood (routine x 2)     Status: None (Preliminary result)   Collection Time: 07/21/14  7:58 PM  Result Value Ref Range Status   Specimen Description BLOOD RIGHT ARM  Final   Special Requests BOTTLES DRAWN AEROBIC AND ANAEROBIC 5CC  Final   Culture   Final           BLOOD CULTURE RECEIVED NO GROWTH TO DATE CULTURE WILL BE HELD FOR 5 DAYS BEFORE ISSUING A FINAL NEGATIVE REPORT Performed at [facility]    Report Status PENDING  Incomplete    Medical History: Past Medical History  Diagnosis Date  . Hypertension   . CAD (coronary artery disease)     positive stress test in  2006 led to left heart cath (8/06) showing 95% prox RCA, 90% CFX, and 90% mLAD.  patient had a cypher DES to all 3 lesions  . Hyperlipidemia   . Hearing loss   . AAA (abdominal aortic aneurysm) 11/09    3.2 cm   . Chronic renal insufficiency   . History of radiation therapy 03/08/04- 04/08/04    prostate 4600 cGy in 3 fractions, radioactive seed implant 05/04/04  . Myocardial infarction 1995  . Pacemaker 2014  . Dysrhythmia   . Shortness of breath     exertion  . Asthma     bronchial  . COPD (chronic obstructive pulmonary disease)   . Cancer 2015    prostate   . Prostate cancer 2005    gleason 7  . Skin cancer     ear  . Lung cancer   . Lung cancer    Medications:  Anti-infectives    Start     Dose/Rate Route Frequency Ordered Stop   07/22/14 1500  ceFEPIme (MAXIPIME) 2 g in dextrose 5 % 50 mL IVPB     2 g 100 mL/hr over 30 Minutes Intravenous 3 times per day 07/22/14 1430 07/29/14 2159   07/21/14 2245  vancomycin (VANCOCIN) 1,250 mg in sodium chloride 0.9 % 250 mL IVPB  1,250 mg 166.7 mL/hr over 90 Minutes Intravenous 2 times daily 07/21/14 2240     07/21/14 2230  ceFEPIme (MAXIPIME) 1 g in dextrose 5 % 50 mL IVPB  Status:  Discontinued     1 g 100 mL/hr over 30 Minutes Intravenous 3 times per day 07/21/14 2217 07/22/14 1430   07/21/14 1930  cefTRIAXone (ROCEPHIN) 1 g in dextrose 5 % 50 mL IVPB     1 g 100 mL/hr over 30 Minutes Intravenous  Once 07/21/14 1929 07/21/14 2044   07/21/14 1930  azithromycin (ZITHROMAX) 500 mg in dextrose 5 % 250 mL IVPB  Status:  Discontinued     500 mg 250 mL/hr over 60 Minutes Intravenous  Once 07/21/14 1929 07/22/14 1416     Assessment: PRINCETON NABOR is a 66 YOM with an extensive past medical history of COPD, asthma, Stage IV lung cancer (last treatment 12/15), prostate cancer (post status of radiation therapy 2005), skin cancer, coronary artery disease (post status of stent placement), pacemaker placement, hypertension, AAA (3.2 cm  x 3.4 cm by ultrasound on 04/09/14), chronic kidney disease. He presented for the [place] ED on 07/21/14 with worsening shortness breath. Recently completed a 7 day course of levofloxacin (2/1-2/8). Chest xray on 07/21/14 revealed superimposed pneumonia. Pharmacy has been consulted to dose vancomycin and cefepime for broad-spectrum coverage in an immunocompromised patient that failed outpatient CAP treatment.   Day #4 vancomycin and cefepime for HCAP. WBC significantly decreased at 1.9 today. Currently afebrile. Renal function is stable with SCr 0.74mg /dL, CrCl 83.58mL/min. Baseline SCr 1.0mg /dL as of 05/13/2014, normalized CrCl ~34mL/min. Vancomycin trough level today was SUBtherapeutic at 12.8 mcg/mL. Due to low suspicion of MRSA and negative cultures, continue the same dose of vancomycin in the anticipation that it will be discontinued in the near future. Cefepime will be continued for broad coverage.   2/8 Cefepime>> 2/8 Vanc>>    2/8 BCx>>NGTD 2/8 UCx>> not sent (UA negative for UTI) 2/8 Sputum Cx>>ordered, not collected 2/8 resp virus panel: IP 2/9 S pneumo Ur Ag: negative 2/9 Legionella Ur Ag: IP  Goal of Therapy:  Vancomycin trough level 15-20 mcg/ml  Plan:  Measure antibiotic drug levels as needed Follow up culture results Monitor renal function Continue Vancomycin 1250mg  IV q12h Continue Cefepime 2gm IV q8h  Gloriajean Dell, PharmD Candidate

## 2014-07-24 NOTE — Progress Notes (Signed)
PROGRESS NOTE    Nicholas Schmitt ZOX:[id number] DOB: 1935-07-29 DOA: 07/21/2014 PCP: [name], MD  HPI/Brief narrative 79 year old male with stage IV lung cancer was admitted on 07/21/2014 with acute hypoxemic respiratory failure in the setting of a right lower lobe infiltrate.   Assessment/Plan:  1. Dyspnea/acute hypoxic respiratory failure: Probably multifactorial. DD-lymphangitic spread of malignancy, pneumonitis/pneumonia (HCAP) and COPD. Pulmonary consultation and follow-up appreciated-do not believe radiation pneumonitis although responded to steroids. Continue antibiotics 5 days, oxygen titration. Chest x-ray reported as worsening. Improving. Continue IV Solu-Medrol for additional 24 hours and then transitioned to prednisone 30 mg daily in a.m. with rapid taper. 2. Stage IV non-small cell lung cancer, adenocarcinoma: Management per oncology-input from 07/22/14 appreciated. As per pulmonary recommendations, goal to start the new immunotherapy for his lung cancer sooner than latter to address problem #1. 3. Essential hypertension: Controlled. Continue Cozaar. 4. History of prostate cancer, status post radiation treatment. Follows with [name] as outpatient. 5. CAD/sick sinus syndrome: Status post stent and PPM. Asymptomatic. 6. Pancytopenia: No recent chemotherapy so less likely the cause.? Medications. Transfuse if hemoglobin <7 g per DL. Improved compared to 2/19. Repeat labs in a.m.   Code Status: Partial Family Communication: Discussed with patient's son, Nicholas Schmitt at bedside. Disposition Plan: Home possibly 2/12.    Consultants:  Pulmonology  Oncology   Procedures:  None  Antibiotics:  Cefepime  Vancomycin-DC'd  Subjective: States dyspnea has significantly improved compared to yesterday.  Objective: Filed Vitals:   07/23/14 2125 07/24/14 0536 07/24/14 1222 07/24/14 1349  BP: 97/57 135/76  118/68  Pulse: 75 83  99  Temp: 97.9 F (36.6 C) 97.5 F  (36.4 C)  98.1 F (36.7 C)  TempSrc: Oral Oral  Oral  Resp: 20   18  SpO2: 94% 95% 92% 92%    Intake/Output Summary (Last 24 hours) at 07/24/14 1543 Last data filed at 07/24/14 8119  Gross per 24 hour  Intake    240 ml  Output   1330 ml  Net  -1090 ml   There were no vitals filed for this visit.   Exam:  General exam: Pleasant elderly male lying comfortably in bed. Respiratory system: Distant breath sounds bilaterally. Crackles in the right base-better. Otherwise clear to auscultation. No increased work of breathing. Cardiovascular system: S1 & S2 heard, RRR. No JVD, murmurs, gallops, clicks or pedal edema. Gastrointestinal system: Abdomen is nondistended, soft and nontender. Normal bowel sounds heard. Central nervous system: Alert and oriented. No focal neurological deficits. Extremities: Symmetric 5 x 5 power.   Data Reviewed: Basic Metabolic Panel:  Recent Labs Lab 07/21/14 1445 07/21/14 1821 07/22/14 0118 07/24/14 0436  NA 138 135 135 139  K 3.8 3.8 4.0 4.1  CL  --  104 105 102  CO2 19* 22 25 26   GLUCOSE 118 104* 125* 172*  BUN 14.1 15 15 16   CREATININE 0.8 0.74 0.73 0.74  CALCIUM 9.2 8.9 8.8 9.3   Liver Function Tests:  Recent Labs Lab 07/21/14 1445 07/21/14 1821  AST 40* 43*  ALT 37 37  ALKPHOS 73 66  BILITOT 0.96 1.2  PROT 6.5 6.8  ALBUMIN 2.6* 3.0*   No results for input(s): LIPASE, AMYLASE in the last 168 hours. No results for input(s): AMMONIA in the last 168 hours. CBC:  Recent Labs Lab 07/21/14 1444 07/21/14 1821 07/22/14 0118 07/24/14 0436  WBC 2.2* 2.3* 1.7* 1.9*  NEUTROABS 1.6 1.6* 1.1*  --   HGB 9.4* 9.4* 8.4* 9.4*  HCT  29.6* 28.9* 26.4* 29.9*  MCV 89.9 91.5 92.3 92.0  PLT 121* 110* 96* 120*   Cardiac Enzymes:  Recent Labs Lab 07/21/14 1821  TROPONINI <0.03   BNP (last 3 results) No results for input(s): PROBNP in the last 8760 hours. CBG: No results for input(s): GLUCAP in the last 168 hours.  Recent Results  (from the past 240 hour(s))  Culture, blood (routine x 2)     Status: None (Preliminary result)   Collection Time: 07/21/14  7:57 PM  Result Value Ref Range Status   Specimen Description BLOOD RIGHT HAND  Final   Special Requests BOTTLES DRAWN AEROBIC ONLY 3CC  Final   Culture   Final           BLOOD CULTURE RECEIVED NO GROWTH TO DATE CULTURE WILL BE HELD FOR 5 DAYS BEFORE ISSUING A FINAL NEGATIVE REPORT Performed at [facility]    Report Status PENDING  Incomplete  Culture, blood (routine x 2)     Status: None (Preliminary result)   Collection Time: 07/21/14  7:58 PM  Result Value Ref Range Status   Specimen Description BLOOD RIGHT ARM  Final   Special Requests BOTTLES DRAWN AEROBIC AND ANAEROBIC 5CC  Final   Culture   Final           BLOOD CULTURE RECEIVED NO GROWTH TO DATE CULTURE WILL BE HELD FOR 5 DAYS BEFORE ISSUING A FINAL NEGATIVE REPORT Performed at [facility]    Report Status PENDING  Incomplete          Studies: Dg Chest 2 View  07/23/2014   CLINICAL DATA:  Subsequent encounter for shortness of breath getting worse for the past 8 months, severe today, personal history of pacemaker and myocardial infarction  EXAM: CHEST  2 VIEW  COMPARISON:  07/21/2014  FINDINGS: Heart size and vascular pattern are normal. Two lead cardiac pacer identified with generator over left thorax. Left lung is clear. There is infiltrate over the right medial lung from the level of the hilum to the diaphragm. Infiltrate extends laterally over the costophrenic angle on the right. No significant pleural effusion. When compared to study performed on 07/21/2014, infiltrate is mildly more extensive.  IMPRESSION: Increased slight mid to lower lung zone infiltrate concerning for pneumonia.   Electronically Signed   By: Skipper Cliche M.D.   On: 07/23/2014 12:12        Scheduled Meds: . budesonide-formoterol  2 puff Inhalation BID  . ceFEPime (MAXIPIME) IV  2 g Intravenous 3 times  per day  . clopidogrel  75 mg Oral Daily  . dextromethorphan-guaiFENesin  1 tablet Oral BID  . folic acid  1 mg Oral Daily  . heparin  5,000 Units Subcutaneous 3 times per day  . losartan  25 mg Oral Daily  . methylPREDNISolone (SOLU-MEDROL) injection  80 mg Intravenous Q12H  . multivitamin with minerals  1 tablet Oral Daily  . senna  2 tablet Oral Daily  . sucralfate  1 g Oral TID WC & HS  . 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    Vernell Leep, MD, FACP, FHM. [facility] Pager [phone]  If 7PM-7AM, please contact night-coverage [email or web address] Password [facility] 07/24/2014, 3:43 PM    LOS: 3 days

## 2014-07-24 NOTE — Telephone Encounter (Signed)
June called stating pt will probably be getting out of the hospital on Friday(tomorrow). His next appt with Dr Julien Nordmann is Tuesday. Does Dr Julien Nordmann feel this appt should be postponed? S/w June and said to keep appt on 2/16. She said OK.

## 2014-07-24 NOTE — Progress Notes (Signed)
PULMONARY / CRITICAL CARE MEDICINE   Name: Nicholas Schmitt MRN: [id number] DOB: 1935/09/26    ADMISSION DATE:  07/21/2014 CONSULTATION DATE:  07/22/2014  REFERRING MD :  Julien Nordmann  CHIEF COMPLAINT:  Shortness of breath  INITIAL PRESENTATION: 79 year old male with stage IV lung cancer was admitted on 07/21/2014 with acute hypoxemic respiratory failure in the setting of a right lower lobe infiltrate.  STUDIES:  06/17/2014 images reviewed by me> mild paraseptal and centrilobular emphysema, pulmonary nodule slightly increased 07/14/2014 CT chest> pulmonary nodules are grossly stable but there is increased groundglass and interlobular septal thickening in the bases of the lungs bilaterally, right greater than left  SIGNIFICANT EVENTS: Steroid trail started 2/10  SUBJECTIVE:  Feeling better   VITAL SIGNS: Temp:  [97.5 F (36.4 C)-98 F (36.7 C)] 97.5 F (36.4 C) (02/11 0536) Pulse Rate:  [75-83] 83 (02/11 0536) Resp:  [20] 20 (02/10 2125) BP: (97-135)/(57-76) 135/76 mmHg (02/11 0536) SpO2:  [94 %-95 %] 95 % (02/11 0536) 2 liters  HEMODYNAMICS:   VENTILATOR SETTINGS:   INTAKE / OUTPUT:  Intake/Output Summary (Last 24 hours) at 07/24/14 1201 Last data filed at 07/24/14 0752  Gross per 24 hour  Intake    540 ml  Output   1330 ml  Net   -790 ml    PHYSICAL EXAMINATION: Gen: well appearing, no acute distress HEENT: NCAT, PERRL, EOMi, OP clear, neck supple without masses PULM: Crackles in lung bases bilaterally, no wheezing, no accessory muscle use. Good air movement  CV: RRR, no mgr, no JVD AB: BS+, soft, nontender, no hsm Ext: warm, no edema, no clubbing, no cyanosis Derm: no rash or skin breakdown Neuro: A&Ox4, CN II-XII intact, strength 5/5 in all 4 extremities  LABS:  CBC  Recent Labs Lab 07/21/14 1821 07/22/14 0118 07/24/14 0436  WBC 2.3* 1.7* 1.9*  HGB 9.4* 8.4* 9.4*  HCT 28.9* 26.4* 29.9*  PLT 110* 96* 120*   Coag's No results for input(s): APTT, INR  in the last 168 hours. BMET  Recent Labs Lab 07/21/14 1821 07/22/14 0118 07/24/14 0436  NA 135 135 139  K 3.8 4.0 4.1  CL 104 105 102  CO2 22 25 26   BUN 15 15 16   CREATININE 0.74 0.73 0.74  GLUCOSE 104* 125* 172*   Electrolytes  Recent Labs Lab 07/21/14 1821 07/22/14 0118 07/24/14 0436  CALCIUM 8.9 8.8 9.3   Sepsis Markers  Recent Labs Lab 07/21/14 2237 07/22/14 0118  LATICACIDVEN 1.2 0.9   ABG No results for input(s): PHART, PCO2ART, PO2ART in the last 168 hours. Liver Enzymes  Recent Labs Lab 07/21/14 1445 07/21/14 1821  AST 40* 43*  ALT 37 37  ALKPHOS 73 66  BILITOT 0.96 1.2  ALBUMIN 2.6* 3.0*   Cardiac Enzymes  Recent Labs Lab 07/21/14 1821  TROPONINI <0.03   Glucose No results for input(s): GLUCAP in the last 168 hours.  Imaging Dg Chest 2 View  07/23/2014   CLINICAL DATA:  Subsequent encounter for shortness of breath getting worse for the past 8 months, severe today, personal history of pacemaker and myocardial infarction  EXAM: CHEST  2 VIEW  COMPARISON:  07/21/2014  FINDINGS: Heart size and vascular pattern are normal. Two lead cardiac pacer identified with generator over left thorax. Left lung is clear. There is infiltrate over the right medial lung from the level of the hilum to the diaphragm. Infiltrate extends laterally over the costophrenic angle on the right. No significant pleural effusion. When compared to study  performed on 07/21/2014, infiltrate is mildly more extensive.  IMPRESSION: Increased slight mid to lower lung zone infiltrate concerning for pneumonia.   Electronically Signed   By: Skipper Cliche M.D.   On: 07/23/2014 12:12     ASSESSMENT / PLAN:  Impression: #1 acute hypoxemic respiratory failure #2 pulmonary infiltrates #3 stage IV lung cancer #4 likely healthcare associated pneumonia  Discussion:  See Initial consult:   The differential diagnosis here includes lymphangitic spread of malignancy (he does have  interlobular septal thickening in the right lung as well as some groundglass changes which could be consistent with this) versus less likely a pneumonitis of some sort. Do not believe that the right lower lobe changes are consistent with radiation pneumonitis considering the right upper lobe was radiated. Also add possibility of superimposed edema. Started steroids 2/10, feeling better   Plan: 1) continue antibiotic therapy as you are doing -plan 5 d course  2) Wean O2 for O2 saturation > 90% 3) Ambulate as able  4) Repeat CXR in AM 5) 5d steroids 6) If no improvement in 24 hours then consider bronchoscopy for further evaluation versus starting treatment with anticancer therapy sooner rather than later for presumed lymphangitic spread of his malignancy.  7) f/u echo  8) get walking oximetry prior to [place] ACNP-BC [place] Pager # [phone] OR # [phone] if no answer   07/24/2014, 12:01 PM   Attending:  I have seen and examined the patient with nurse practitioner/resident and agree with the note above.   Mr. Palardy feels better. I remain skeptical about the possibility of radiation pneumonitis, but I can't deny that he feels better after receiving steroids.    I recommend that antibiotics be stopped after 5 days total and steroids be stopped after 5 days total.  You could start prednisone 30mg  after the solumedrol dose tomorrow morning, then taper off quickly.    The goal here is for him to start the new immunotherapy for his lung cancer sooner rather than later.  No need for bronch at this point.  PCCM will be available as needed, call if questions  Roselie Awkward, MD [place] [facility] Pager: [phone] Cell: [phone] If no response, call [phone]

## 2014-07-25 LAB — RESPIRATORY VIRUS PANEL

## 2014-07-25 LAB — CBC
HCT: 29.2 % — ABNORMAL LOW (ref 39.0–52.0)
Hemoglobin: 9.1 g/dL — ABNORMAL LOW (ref 13.0–17.0)
MCH: 28.8 pg (ref 26.0–34.0)
MCHC: 31.2 g/dL (ref 30.0–36.0)
MCV: 92.4 fL (ref 78.0–100.0)
PLATELETS: 130 10*3/uL — AB (ref 150–400)
RBC: 3.16 MIL/uL — ABNORMAL LOW (ref 4.22–5.81)
RDW: 17.5 % — AB (ref 11.5–15.5)
WBC: 4.3 10*3/uL (ref 4.0–10.5)

## 2014-07-25 MED ORDER — AMOXICILLIN-POT CLAVULANATE 875-125 MG PO TABS
1.0000 | ORAL_TABLET | Freq: Two times a day (BID) | ORAL | Status: DC
  Administered 2014-07-25: 1 via ORAL
  Filled 2014-07-25 (×2): qty 1

## 2014-07-25 MED ORDER — LOSARTAN POTASSIUM 50 MG PO TABS: 25.0000 mg | ORAL_TABLET | Freq: Every day | ORAL | Status: DC

## 2014-07-25 MED ORDER — AMOXICILLIN-POT CLAVULANATE 875-125 MG PO TABS: 1.0000 | ORAL_TABLET | Freq: Two times a day (BID) | ORAL | Status: DC

## 2014-07-25 MED ORDER — BUDESONIDE-FORMOTEROL FUMARATE 160-4.5 MCG/ACT IN AERO: 2.0000 | INHALATION_SPRAY | Freq: Two times a day (BID) | RESPIRATORY_TRACT | Status: AC

## 2014-07-25 MED ORDER — PREDNISONE 10 MG PO TABS: ORAL_TABLET | ORAL | Status: DC

## 2014-07-25 NOTE — Progress Notes (Signed)
SATURATION QUALIFICATIONS: (This note is used to comply with regulatory documentation for home oxygen)  Patient Saturations on Room Air at Rest = 94%  Patient Saturations on Room Air while Ambulating = 88%  Patient Saturations on 2 Liters of oxygen while Ambulating = 94%  Please briefly explain why patient needs home oxygen: Patient gets out of breathe when walking and oxygen level decreases.

## 2014-07-25 NOTE — Discharge Summary (Signed)
Physician Discharge Summary  Nicholas Schmitt JJK:[id number] DOB: 07/04/1935 DOA: 07/21/2014  PCP: [name], MD  Admit date: 07/21/2014 Discharge date: 07/25/2014  Time spent: Less than 30 minutes  Recommendations for Outpatient Follow-up:  1. [name], PCP in 5 days with repeat labs (CBC & BMP) - if these are not done at the cancer center. Please follow final blood culture results that were sent from the hospital. 2. [name], Medical Oncology: call on 07/28/2014 for appointment. 3. Home oxygen at 2 L/m continuously via nasal cannula, especially with activity. 4. Home health PT, OT, tub bench and rolling walker.  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  There were no vitals filed for this visit.  History of present illness:  79 year old male with stage IV lung cancer was admitted on 07/21/2014 with acute hypoxemic respiratory failure in the setting of a right lower lobe infiltrate.  Hospital Course:    Dyspnea/acute hypoxic respiratory failure: Probably multifactorial. DD-lymphangitic spread of malignancy, pneumonitis/pneumonia (HCAP) and COPD. Pulmonary was consulted and indicated that differential diagnosis included lymphangitic spread of malignancy versus less likely a pneumonitis of some sort. Skeptical about the possibility of radiation pneumonitis but patient did improve after  steroids. He was treated empirically with IV antibiotics and IV Solu-Medrol. Pulmonary recommended 5 days of antibiotics and rapid taper of steroids. The goal here is for him to start the new immunotherapy for his lung cancer sooner than latter. No bronchoscopy indicated at this point. Patient is hypoxic at 88% on room air with activity and will be discharged on home oxygen. Urine Legionella and pneumococcal antigen: Negative. RSV panel-not performed.  Stage IV non-small cell lung cancer, adenocarcinoma: Oncology consulted. Patient is status post 6 cycles of induction chemotherapy with carboplatin and Alimta with evidence for disease progression after cycle #6. This was followed by palliative radiotherapy to the mid back and right side of the chest. He is supported to start the first cycle of immunotherapy with [place] after improvement of his dyspnea. Outpatient follow-up with oncology.  Essential hypertension: Controlled. Continue home dose of Cozaar.  History of prostate cancer, status post radiation treatment. Follows with [name] as outpatient.  CAD/sick sinus syndrome: Status post stent and PPM. Asymptomatic. Continue Plavix.  Pancytopenia: No recent chemotherapy so less likely the cause.? Medications versus acute illness. Improving. Follow CBC as outpatient.   Consultants:  Pulmonology  Oncology  Procedures:  None    Discharge Exam:  Complaints:  Patient denies dyspnea, cough or chest pain. Denies any other complaints.  Filed Vitals:   07/24/14 1924 07/24/14 2130 07/25/14 0500 07/25/14 0756  BP:  113/66 131/57   Pulse:  83 77   Temp:  97.3 F (36.3 C) 98 F (36.7 C)   TempSrc:  Oral Oral   Resp:  20 20   SpO2: 93% 93% 98% 94%    General exam: Pleasant elderly male lying comfortably in bed.  Respiratory system: Distant breath sounds bilaterally. Crackles in the right base-better. Otherwise clear to auscultation. No increased work of breathing. Cardiovascular  system: S1 & S2 heard, RRR. No JVD, murmurs, gallops, clicks or pedal edema. Gastrointestinal system: Abdomen is nondistended, soft and nontender. Normal bowel sounds heard. Central nervous system: Alert and oriented. No focal neurological deficits. Extremities: Symmetric 5 x 5 power.  Discharge Instructions      Discharge Instructions    Call MD for:  difficulty breathing, headache or visual disturbances    Complete by:  As directed      Call MD for:  extreme fatigue    Complete by:  As directed      Call MD for:  temperature >100.4    Complete by:  As directed      Diet - low sodium heart healthy    Complete by:  As directed      Discharge instructions    Complete by:  As directed   Oxygen via nasal cannula at 2 L/m, with activity.     Increase activity slowly    Complete by:  As directed             Medication List    STOP taking these medications        GOODY HEADACHE PO     levofloxacin 500 MG tablet  Commonly known as:  LEVAQUIN      TAKE these medications        albuterol 90 MCG/ACT inhaler  Commonly known as:  PROVENTIL,VENTOLIN  Inhale 2 puffs into the lungs every 6 (six) hours as needed for wheezing or shortness of breath.     amoxicillin-clavulanate 875-125 MG per tablet  Commonly known as:  AUGMENTIN  Take 1 tablet by mouth every 12 (twelve) hours.     budesonide-formoterol 160-4.5 MCG/ACT inhaler  Commonly known as:  SYMBICORT  Inhale 2 puffs into the lungs 2 (two) times daily.     CALCIUM/MAGNESIUM/ZINC PO  Take 1 capsule by mouth every evening.     clopidogrel 75 MG tablet  Commonly known as:  PLAVIX  Take 1 tablet (75 mg total) by mouth daily.     folic acid 1 MG tablet  Commonly known as:  FOLVITE  Take 1 tablet (1 mg total) by mouth daily.     hyaluronate sodium Gel  Apply 1 application topically once.     losartan 50 MG tablet  Commonly known as:  COZAAR  Take 0.5 tablets (25 mg total) by mouth daily.     multivitamin tablet   Take 1 tablet by mouth daily.     naproxen sodium 220 MG tablet  Commonly known as:  ANAPROX  Take 440 mg by mouth 2 (two) times daily as needed (pain).     nitroGLYCERIN 0.4 MG SL tablet  Commonly known as:  NITROSTAT  Place 0.4 mg under the tongue every 5 (five) minutes as needed for chest pain (MAX 3 TABLETS).     oxyCODONE 5 MG immediate release tablet  Commonly known as:  Oxy IR/ROXICODONE  Take 1-2 tablets (5-10 mg total) by mouth every 4 (four) hours as needed for moderate pain.     predniSONE 10 MG tablet  Commonly known as:  DELTASONE  Starting 07/26/2014: Take 3 tablets daily 1 day, then 2 tablets daily 2 days, then 1 tablet daily 2 days, then stop.     prochlorperazine 10 MG tablet  Commonly known as:  COMPAZINE  Take 1 tablet (10 mg  total) by mouth every 6 (six) hours as needed for nausea or vomiting.     sucralfate 1 G tablet  Commonly known as:  CARAFATE  Take 1 g by mouth 4 (four) times daily -  with meals and at bedtime. Dissolve tablets in 15-30 ml of water before taking.  Take half hour before meals and at bedtime.  Disp. 42 tablets. 0 refills. Called in to [facility] per Dr. Lisbeth Renshaw.       Follow-up Information    Follow up with [name],[name] ALLEN, MD. Schedule an appointment as soon as possible for a visit in 5 days.   Specialty:  Family Medicine   Why:  To be seen with repeat labs- if not done at cancer center (CBC & BMP).   Contact information:   [place] [address] [phone]       Follow up with Eilleen Kempf., MD On 07/28/2014.   Specialty:  Oncology   Why:  Call for appointment.   Contact information:   [place] [address] [phone]        The results of significant diagnostics from this hospitalization (including imaging, microbiology, ancillary and laboratory) are listed below for reference.    Significant Diagnostic Studies: Dg Chest 2 View  07/23/2014   CLINICAL DATA:   Subsequent encounter for shortness of breath getting worse for the past 8 months, severe today, personal history of pacemaker and myocardial infarction  EXAM: CHEST  2 VIEW  COMPARISON:  07/21/2014  FINDINGS: Heart size and vascular pattern are normal. Two lead cardiac pacer identified with generator over left thorax. Left lung is clear. There is infiltrate over the right medial lung from the level of the hilum to the diaphragm. Infiltrate extends laterally over the costophrenic angle on the right. No significant pleural effusion. When compared to study performed on 07/21/2014, infiltrate is mildly more extensive.  IMPRESSION: Increased slight mid to lower lung zone infiltrate concerning for pneumonia.   Electronically Signed   By: Skipper Cliche M.D.   On: 07/23/2014 12:12   Ct Angio Chest Pe W/cm &/or Wo Cm  07/14/2014   CLINICAL DATA:  Patient with diagnosis of non-small-cell lung cancer, currently undergoing palliative radiation therapy to right upper chest lesion. Now with worsening dyspnea. Evaluate for pulmonary embolism and/or pneumonitis.  EXAM: CT ANGIOGRAPHY CHEST WITH CONTRAST  TECHNIQUE: Multidetector CT imaging of the chest was performed using the standard protocol during bolus administration of intravenous contrast. Multiplanar CT image reconstructions and MIPs were obtained to evaluate the vascular anatomy.  CONTRAST:  138mL OMNIPAQUE IOHEXOL 350 MG/ML SOLN  COMPARISON:  06/17/2014; 06/04/2014  FINDINGS: Vascular Findings:  There is suboptimal opacification of the pulmonary arterial system with the main pulmonary artery measuring only 198 Hounsfield units. There are no discrete filling defects within the pulmonary arterial tree to the level of the bilateral subsegmental pulmonary arteries. Evaluation of the distal subsegmental pulmonary arteries is degraded secondary to a combination of suboptimal vessel opacification as well as patient respiratory artifact.  There is grossly unchanged moderate  (approximately 50%) narrowing of the distal aspect of the right main pulmonary artery (coronal image 74, series 601) and occlusion of the right upper lobe pulmonary artery (coronal image 72) secondary to grossly unchanged right hilar and mediastinal adenopathy. Normal caliber of the main pulmonary artery.  Normal heart size. Trace amount of pericardial fluid, presumably physiologic. Coronary artery calcifications. Left anterior chest wall dual lead pacemaker with tips terminating within the  right atrium and ventricle.  Scattered atherosclerotic plaque within a normal caliber thoracic aorta. No definite thoracic aortic dissection or periaortic stranding on this non gated examination.  There is unchanged narrowing of the SVC and central aspect of the left innominate vein secondary to grossly unchanged mediastinal adenopathy with associated filling of hypertrophied mediastinal and paraspinal venous collaterals.  Review of the MIP images confirms the above findings.   ----------------------------------------------------------------------------------  Nonvascular Findings:  Evaluation of the pulmonary parenchyma is degraded secondary to patient respiratory artifact.  Previously identified spiculated nodules within right lung apex are unchanged with dominant nodule measuring 1.2 cm in greatest short axis diameter (image 22, series 8) and additional nodule within the right lung apex measuring approximately 1.0 cm (image 19, series 8).  Consolidative nodal conglomeration involving the right superior hilum is unchanged, measuring 1.9 x 1.9 cm (image 42, series 5). This finding is again associated with narrowing of the adjacent right main pulmonary artery as well as malignant occlusion of the right upper lobe pulmonary artery.  There is grossly unchanged ill-defined soft tissue mass within the anterior mediastinum which results in narrowing of the superior aspect of the SVC (image 34, series 5) as well as the central aspect  of the left innominate vein (image 31, series 5) with associated filling of hypertrophied mediastinal and paraspinal venous collaterals.  Dominant pretracheal nodal conglomeration is grossly unchanged, measuring 2.2 cm in diameter (image 34, series 5).  There is a minimal amount of nonocclusive debris within distal aspect of the trachea (image 29, series 8). There is grossly unchanged narrowing of the right upper lobe bronchus (image 40, series 8) as well as the lateral and posterior basilar segmental bronchi of the right lower lobe (image 53, series 8). The remaining central bronchi appear patent.  Worsening bibasilar dependent ground-glass opacities, right greater than left with relative areas of consolidation within the right lower lobe (image 38 and 50, series 8). Mild diffuse interstitial thickening. No pleural effusion or pneumothorax.  Known metastasis involving the right adrenal gland appears morphologically similar the prior examination though is incompletely imaged (image 90, series 5). Hypoattenuating lesions within the dome of the liver are unchanged and again favored to represent hepatic cysts.  No definite acute or aggressive osseous abnormalities. Regional soft tissues appear otherwise normal.  IMPRESSION: 1. No evidence of pulmonary embolism to the level of the bilateral subsegmental pulmonary arteries. 2. Unchanged moderate (approximately 50%) narrowing of the distal aspect of the right main pulmonary artery and occlusion of the right upper lobe pulmonary artery secondary to malignant right hilar adenopathy. 3. Unchanged narrowing of the superior aspect of the SVC and central aspect of the left innominate vein secondary to grossly unchanged mediastinal adenopathy with associated filling of multiple mediastinal and paraspinal venous collaterals. 4. Worsening bibasilar dependent opacities with geographic areas of confluence within the right lower lung, possibly atelectasis though developing areas of  infection (including atypical etiologies) could have a similar appearance. 5. Spiculated nodules within the right lung apex with associated right hilar and mediastinal adenopathy appears similar to the prior examination. 6. Grossly unchanged right adrenal gland metastasis, incompletely imaged.   Electronically Signed   By: Sandi Mariscal M.D.   On: 07/14/2014 17:16   Dg Chest Port 1 View  07/21/2014   CLINICAL DATA:  Chronic shortness of breath for 3 months. Hypertension. History of metastatic lung cancer.  EXAM: PORTABLE CHEST - 1 VIEW  COMPARISON:  07/14/2014, 06/02/2014  FINDINGS: Left subclavian 2 lead pacer noted.  Normal heart size and vascularity. Worsening mid and lower lung patchy airspace process compatible with superimposed pneumonia. No effusion or pneumothorax. Trachea is midline. Atherosclerosis noted of the aorta. Chronic left posterior medial diaphragmatic hernia.  IMPRESSION: Right greater than left bibasilar streaky airspace process concerning for superimposed pneumonia.   Electronically Signed   By: Daryll Brod M.D.   On: 07/21/2014 18:41    Microbiology: Recent Results (from the past 240 hour(s))  Culture, blood (routine x 2)     Status: None (Preliminary result)   Collection Time: 07/21/14  7:57 PM  Result Value Ref Range Status   Specimen Description BLOOD RIGHT HAND  Final   Special Requests BOTTLES DRAWN AEROBIC ONLY 3CC  Final   Culture   Final           BLOOD CULTURE RECEIVED NO GROWTH TO DATE CULTURE WILL BE HELD FOR 5 DAYS BEFORE ISSUING A FINAL NEGATIVE REPORT Performed at [facility]    Report Status PENDING  Incomplete  Culture, blood (routine x 2)     Status: None (Preliminary result)   Collection Time: 07/21/14  7:58 PM  Result Value Ref Range Status   Specimen Description BLOOD RIGHT ARM  Final   Special Requests BOTTLES DRAWN AEROBIC AND ANAEROBIC 5CC  Final   Culture   Final           BLOOD CULTURE RECEIVED NO GROWTH TO DATE CULTURE WILL BE HELD FOR 5  DAYS BEFORE ISSUING A FINAL NEGATIVE REPORT Performed at [facility]    Report Status PENDING  Incomplete  Respiratory virus panel     Status: None   Collection Time: 07/21/14 11:21 PM  Result Value Ref Range Status   Source - RVPAN NOSE  Corrected   Respiratory Syncytial Virus A TNP  Final    Comment: Test not performed CYNTHIA COBBS 07/25/14 0948 STSMITH    Respiratory Syncytial Virus B TNP  Final    Comment: Test not performed   Influenza A NFSR  Final    Comment: (NOTE) No frozen specimen received. Notified Cliffton Asters 07/25/2014    Influenza B TNP  Final    Comment: Test not performed   Parainfluenza 1 TNP  Final    Comment: Test not performed   Parainfluenza 2 TNP  Final    Comment: Test not performed   Parainfluenza 3 TNP  Final    Comment: Test not performed   Metapneumovirus TNP  Final    Comment: Test not performed   Rhinovirus TNP  Final    Comment: Test not performed   Adenovirus TNP  Final    Comment: (NOTE) Test not performed Performed At: [facility] [place], [place] [id number] Lindon Romp MD RF:[id number]      Labs: Basic Metabolic Panel:  Recent Labs Lab 07/21/14 1445 07/21/14 1821 07/22/14 0118 07/24/14 0436  NA 138 135 135 139  K 3.8 3.8 4.0 4.1  CL  --  104 105 102  CO2 19* 22 25 26   GLUCOSE 118 104* 125* 172*  BUN 14.1 15 15 16   CREATININE 0.8 0.74 0.73 0.74  CALCIUM 9.2 8.9 8.8 9.3   Liver Function Tests:  Recent Labs Lab 07/21/14 1445 07/21/14 1821  AST 40* 43*  ALT 37 37  ALKPHOS 73 66  BILITOT 0.96 1.2  PROT 6.5 6.8  ALBUMIN 2.6* 3.0*   No results for input(s): LIPASE, AMYLASE in the last 168 hours. No results for input(s): AMMONIA  in the last 168 hours. CBC:  Recent Labs Lab 07/21/14 1444 07/21/14 1821 07/22/14 0118 07/24/14 0436 07/25/14 0434  WBC 2.2* 2.3* 1.7* 1.9* 4.3  NEUTROABS 1.6 1.6* 1.1*  --   --   HGB 9.4* 9.4* 8.4* 9.4* 9.1*  HCT 29.6* 28.9* 26.4*  29.9* 29.2*  MCV 89.9 91.5 92.3 92.0 92.4  PLT 121* 110* 96* 120* 130*   Cardiac Enzymes:  Recent Labs Lab 07/21/14 1821  TROPONINI <0.03   BNP: BNP (last 3 results)  Recent Labs  07/21/14 1821  BNP 39.7    ProBNP (last 3 results) No results for input(s): PROBNP in the last 8760 hours.  CBG: No results for input(s): GLUCAP in the last 168 hours.      Signed:  Vernell Leep, MD, FACP, FHM. [facility] Pager [phone]  If 7PM-7AM, please contact night-coverage [email or web address] Password [facility] 07/25/2014, 2:58 PM

## 2014-07-25 NOTE — Progress Notes (Signed)
Patient stable at time of discharge. Reviewed discharge education with patient and family and significant other. They verbalized understanding and had no further questions.

## 2014-07-25 NOTE — Care Management Note (Signed)
CARE MANAGEMENT NOTE 07/25/2014  Patient:  Nicholas Schmitt, Nicholas Schmitt   Account Number:  [id number]  Date Initiated:  07/23/2014  Documentation initiated by:  Marney Doctor  Subjective/Objective Assessment:   79 yo admitted with SOB, stage IV lung cancer     Action/Plan:   From home alone   Anticipated DC Date:  07/25/2014   Anticipated DC Plan:  [place]  CM consult      [facility] Choice  HOME HEALTH   Choice offered to / List presented to:  C-1 Patient   DME arranged  [place]      DME agency  [place] arranged  [place].   Status of service:  In process, will continue to follow Medicare Important Message given?  YES (If response is "NO", the following Medicare IM given date fields will be blank) Date Medicare IM given:  07/23/2014 Medicare IM given by:  Marney Doctor Date Additional Medicare IM given:   Additional Medicare IM given by:    Discharge Disposition:    Per UR Regulation:  Reviewed for med. necessity/level of care/duration of stay  If discussed at [place] of Stay Meetings, dates discussed:    Comments:  07/25/14 Marney Doctor RN,BSN,NCM Spoke with pts son [name] who states he dad decided to use [facility] for [facility] services.  Referral given to [facility] rep. Orders written for RW,Tub seat and home 02. [place] [facility] rep informed of DME orders.  No other CM needs noted  07/23/14 Marney Doctor RN,BSN,NCM [phone] Spoke with pt, son's  and pt girlfriend at bedside about DC needs.  PT/OT evals done and HHPT/OT recommended at home. Pt offered choice for [facility] services. Pt and family unsure who they would like to use at this time.  List left with pt and was asked to check back for choice.  Will need MD orders for HHPT/OT, rolling walker and tub bench.  Per NP unsure if pt will need home 02 at this time.  Pt has a wood stove at home.  Per [facility] DME rep pt  will need to keep his 02 tank and concentrator at least 10 feet from wood stove if home 02 is needed.  CM will continue to follow.

## 2014-07-26 ENCOUNTER — Encounter: Payer: Self-pay | Admitting: Radiation Oncology

## 2014-07-26 NOTE — Progress Notes (Signed)
  Radiation Oncology         [phone] ________________________________  Name: Nicholas Schmitt MRN: [id number]  Date: 07/26/2014  DOB: 09-07-1935  End of Treatment Note  Diagnosis:   Stage IV non-small cell lung cancer  Indication for treatment:  Painful osseous metastasis within the lower lumbar/upper sacrum area and progressive disease in the right chest       Radiation treatment dates:   January 11 through January 29  Site/dose:   Right central chest 35 gray in 14 fractions, lower lumbar/upper sacrum 35 gray in 14 fractions  Beams/energy:   3-D conformal directed at the right chest, 4 field set up directed at the lower lumbar/upper sacrum area  Narrative: The patient tolerated radiation treatment relatively well.  He did have improvement in his pain in the lower back area. Patient did have some esophageal symptoms related to his chest treatment. This was treated with oxycodone And Carafate Plan: The patient has completed radiation treatment. The patient will return to radiation oncology clinic for routine followup in one month. I advised them to call or return sooner if they have any questions or concerns related to their recovery or treatment.  -----------------------------------  Blair Promise, PhD, MD

## 2014-07-28 ENCOUNTER — Telehealth: Payer: Self-pay | Admitting: Medical Oncology

## 2014-07-28 ENCOUNTER — Other Ambulatory Visit: Payer: Self-pay | Admitting: Medical Oncology

## 2014-07-28 DIAGNOSIS — C3490 Malignant neoplasm of unspecified part of unspecified bronchus or lung: Secondary | ICD-10-CM

## 2014-07-28 LAB — CULTURE, BLOOD (ROUTINE X 2)
Culture: NO GROWTH
Culture: NO GROWTH

## 2014-07-28 NOTE — Telephone Encounter (Addendum)
Can he continue prednisone with Nat Math? Per Dr Julien Nordmann as long as the prednisone  dose is 10 mg or less he can take his [place]

## 2014-07-29 ENCOUNTER — Encounter: Payer: Self-pay | Admitting: Internal Medicine

## 2014-07-29 ENCOUNTER — Other Ambulatory Visit (HOSPITAL_BASED_OUTPATIENT_CLINIC_OR_DEPARTMENT_OTHER): Payer: Medicare Other

## 2014-07-29 ENCOUNTER — Telehealth: Payer: Self-pay | Admitting: *Deleted

## 2014-07-29 ENCOUNTER — Other Ambulatory Visit: Payer: BLUE CROSS/BLUE SHIELD

## 2014-07-29 ENCOUNTER — Ambulatory Visit (HOSPITAL_BASED_OUTPATIENT_CLINIC_OR_DEPARTMENT_OTHER): Payer: Medicare Other | Admitting: Internal Medicine

## 2014-07-29 ENCOUNTER — Ambulatory Visit: Payer: BLUE CROSS/BLUE SHIELD | Admitting: Internal Medicine

## 2014-07-29 ENCOUNTER — Ambulatory Visit (HOSPITAL_BASED_OUTPATIENT_CLINIC_OR_DEPARTMENT_OTHER): Payer: Medicare Other

## 2014-07-29 ENCOUNTER — Telehealth: Payer: Self-pay | Admitting: Internal Medicine

## 2014-07-29 VITALS — BP 122/58 | HR 90 | Temp 98.1°F | Resp 18 | Ht 71.0 in | Wt 195.4 lb

## 2014-07-29 DIAGNOSIS — C3411 Malignant neoplasm of upper lobe, right bronchus or lung: Secondary | ICD-10-CM

## 2014-07-29 DIAGNOSIS — N189 Chronic kidney disease, unspecified: Secondary | ICD-10-CM

## 2014-07-29 DIAGNOSIS — I10 Essential (primary) hypertension: Secondary | ICD-10-CM

## 2014-07-29 DIAGNOSIS — C3491 Malignant neoplasm of unspecified part of right bronchus or lung: Secondary | ICD-10-CM

## 2014-07-29 DIAGNOSIS — C3490 Malignant neoplasm of unspecified part of unspecified bronchus or lung: Secondary | ICD-10-CM

## 2014-07-29 DIAGNOSIS — I2510 Atherosclerotic heart disease of native coronary artery without angina pectoris: Secondary | ICD-10-CM

## 2014-07-29 DIAGNOSIS — Z5112 Encounter for antineoplastic immunotherapy: Secondary | ICD-10-CM

## 2014-07-29 LAB — CBC WITH DIFFERENTIAL/PLATELET
BASO%: 0.3 % (ref 0.0–2.0)
Basophils Absolute: 0 10*3/uL (ref 0.0–0.1)
EOS%: 2.3 % (ref 0.0–7.0)
Eosinophils Absolute: 0.1 10*3/uL (ref 0.0–0.5)
HCT: 30.1 % — ABNORMAL LOW (ref 38.4–49.9)
HEMOGLOBIN: 9.5 g/dL — AB (ref 13.0–17.1)
LYMPH%: 4.1 % — AB (ref 14.0–49.0)
MCH: 28.9 pg (ref 27.2–33.4)
MCHC: 31.6 g/dL — AB (ref 32.0–36.0)
MCV: 91.3 fL (ref 79.3–98.0)
MONO#: 0.4 10*3/uL (ref 0.1–0.9)
MONO%: 10.2 % (ref 0.0–14.0)
NEUT#: 3.6 10*3/uL (ref 1.5–6.5)
NEUT%: 83.1 % — ABNORMAL HIGH (ref 39.0–75.0)
PLATELETS: 175 10*3/uL (ref 140–400)
RBC: 3.29 10*6/uL — ABNORMAL LOW (ref 4.20–5.82)
RDW: 19.9 % — ABNORMAL HIGH (ref 11.0–14.6)
WBC: 4.3 10*3/uL (ref 4.0–10.3)
lymph#: 0.2 10*3/uL — ABNORMAL LOW (ref 0.9–3.3)

## 2014-07-29 LAB — COMPREHENSIVE METABOLIC PANEL (CC13)
ALBUMIN: 2.7 g/dL — AB (ref 3.5–5.0)
ALK PHOS: 68 U/L (ref 40–150)
ALT: 47 U/L (ref 0–55)
AST: 29 U/L (ref 5–34)
Anion Gap: 11 mEq/L (ref 3–11)
BUN: 16 mg/dL (ref 7.0–26.0)
CALCIUM: 9.4 mg/dL (ref 8.4–10.4)
CHLORIDE: 108 meq/L (ref 98–109)
CO2: 23 mEq/L (ref 22–29)
Creatinine: 0.7 mg/dL (ref 0.7–1.3)
GLUCOSE: 127 mg/dL (ref 70–140)
Potassium: 3.8 mEq/L (ref 3.5–5.1)
Sodium: 142 mEq/L (ref 136–145)
Total Bilirubin: 0.83 mg/dL (ref 0.20–1.20)
Total Protein: 6.2 g/dL — ABNORMAL LOW (ref 6.4–8.3)

## 2014-07-29 MED ORDER — SODIUM CHLORIDE 0.9 % IV SOLN
Freq: Once | INTRAVENOUS | Status: AC
  Administered 2014-07-29: 10:00:00 via INTRAVENOUS

## 2014-07-29 MED ORDER — PROCHLORPERAZINE MALEATE 10 MG PO TABS
ORAL_TABLET | ORAL | Status: AC
  Filled 2014-07-29: qty 1

## 2014-07-29 MED ORDER — PROCHLORPERAZINE MALEATE 10 MG PO TABS
10.0000 mg | ORAL_TABLET | Freq: Once | ORAL | Status: AC
  Administered 2014-07-29: 10 mg via ORAL

## 2014-07-29 MED ORDER — SODIUM CHLORIDE 0.9 % IV SOLN
2.0000 mg/kg | Freq: Once | INTRAVENOUS | Status: AC
  Administered 2014-07-29: 200 mg via INTRAVENOUS
  Filled 2014-07-29: qty 8

## 2014-07-29 NOTE — Progress Notes (Signed)
[name] [phone]   [phone]  OFFICE PROGRESS NOTE  [name],[name] [name], MD [place] [address]  DIAGNOSIS: Non-small cell carcinoma of lung, stage 4  Primary site: Lung (Right)  Staging method: AJCC 7th Edition  Clinical: Stage IV (T1a, N3, M1b) signed by Curt Bears, MD on 02/01/2014 1:42 PM  Summary: Stage IV (T1a, N3, M1b) Prostate cancer  Primary site: Prostate  Clinical: Stage I (T1c, NX, M0) signed by Wyatt Portela, MD on 08/06/2013 1:59 PM  Summary: Stage I (T1c, NX, M0)  PRIOR THERAPY:  1) Systemic chemotherapy with carboplatin for an AUC of 5 and Alimta 500 mg per meter squared given every 3 weeks. Status post 6 cycles, last dose was given 05/20/2014 discontinued secondary to disease progression. 2) palliative radiotherapy to the right lung and lumbar spines under the care of Dr. Sondra Come.  CURRENT THERAPY: Immunotherapy with Ketruda (pembrolizumab) 2 MG/KG every 3 weeks. First dose 07/29/2014. He has positive PDL 1 expression (90%).  INTERVAL HISTORY: Nicholas Schmitt 79 y.o. male returns to the clinic today for follow-up visit accompanied by his wife. He recently completed a course of palliative radiotherapy to the right lung and lumbar spines. This course was complicated with significant dyspnea most likely secondary to radiation induced pneumonitis. The patient was recently admitted to [facility] for shortness of breath and was treated for pneumonia. He was also treated with a tapered dose of prednisone and currently on 10 mg by mouth daily which is expected to be completed tomorrow. The patient is feeling much better today and he is here to start the first cycle of his immunotherapy with [place] (pembrolizumab). He is feeling fine today was no specific complaints except for mild fatigue. He denied having any significant fever or chills, no nausea or vomiting. The patient denied having  any significant chest pain, shortness of breath except with exertion, cough or hemoptysis.   MEDICAL HISTORY: Past Medical History  Diagnosis Date  . Hypertension   . CAD (coronary artery disease)     positive stress test in 2006 led to left heart cath (8/06) showing 95% prox RCA, 90% CFX, and 90% mLAD.  patient had a cypher DES to all 3 lesions  . Hyperlipidemia   . Hearing loss   . AAA (abdominal aortic aneurysm) 11/09    3.2 cm   . Chronic renal insufficiency   . History of radiation therapy 03/08/04- 04/08/04    prostate 4600 cGy in 3 fractions, radioactive seed implant 05/04/04  . Myocardial infarction 1995  . Pacemaker 2014  . Dysrhythmia   . Shortness of breath     exertion  . Asthma     bronchial  . COPD (chronic obstructive pulmonary disease)   . Cancer 2015    prostate   . Prostate cancer 2005    gleason 7  . Skin cancer     ear  . Lung cancer   . Lung cancer     ALLERGIES:  has No Known Allergies.  MEDICATIONS:  Current Outpatient Prescriptions  Medication Sig Dispense Refill  . albuterol (PROVENTIL,VENTOLIN) 90 MCG/ACT inhaler Inhale 2 puffs into the lungs every 6 (six) hours as needed for wheezing or shortness of breath.    [name] amLODipine (NORVASC) 5 MG tablet   2  . amoxicillin-clavulanate (AUGMENTIN) 875-125 MG per tablet Take 1 tablet by mouth every 12 (twelve) hours. 3 tablet 0  . budesonide-formoterol (SYMBICORT) 160-4.5 MCG/ACT  inhaler Inhale 2 puffs into the lungs 2 (two) times daily.    . clopidogrel (PLAVIX) 75 MG tablet Take 1 tablet (75 mg total) by mouth daily. 626 tablet 3  . folic acid (FOLVITE) 1 MG tablet Take 1 tablet (1 mg total) by mouth daily. 30 tablet 3  . hyaluronate sodium (RADIAPLEXRX) GEL Apply 1 application topically once.    [name] losartan (COZAAR) 50 MG tablet Take 0.5 tablets (25 mg total) by mouth daily.    . Multiple Minerals (CALCIUM/MAGNESIUM/ZINC PO) Take 1 capsule by mouth every evening.    . Multiple Vitamin (MULTIVITAMIN)  tablet Take 1 tablet by mouth daily.     . naproxen sodium (ANAPROX) 220 MG tablet Take 440 mg by mouth 2 (two) times daily as needed (pain).     . nitroGLYCERIN (NITROSTAT) 0.4 MG SL tablet Place 0.4 mg under the tongue every 5 (five) minutes as needed for chest pain (MAX 3 TABLETS).    [name] oxyCODONE (OXY IR/ROXICODONE) 5 MG immediate release tablet Take 1-2 tablets (5-10 mg total) by mouth every 4 (four) hours as needed for moderate pain. 30 tablet 0  . predniSONE (DELTASONE) 10 MG tablet Starting 07/26/2014: Take 3 tablets daily 1 day, then 2 tablets daily 2 days, then 1 tablet daily 2 days, then stop. 9 tablet 0  . prochlorperazine (COMPAZINE) 10 MG tablet Take 1 tablet (10 mg total) by mouth every 6 (six) hours as needed for nausea or vomiting. 30 tablet 0  . sucralfate (CARAFATE) 1 G tablet Take 1 g by mouth 4 (four) times daily -  with meals and at bedtime. Dissolve tablets in 15-30 ml of water before taking.  Take half hour before meals and at bedtime.  Disp. 42 tablets. 0 refills. Called in to [facility] per Dr. Lisbeth Renshaw.     No current facility-administered medications for this visit.   Facility-Administered Medications Ordered in Other Visits  Medication Dose Route Frequency Provider Last Rate Last Dose  . heparin lock flush 100 unit/mL  500 Units Intracatheter Daily PRN Adrena E Johnson, PA-C      . sodium chloride 0.9 % injection 10 mL  10 mL Intracatheter PRN Adrena E Johnson, PA-C      . sodium chloride 0.9 % injection 3 mL  3 mL Intracatheter PRN Carlton Adam, PA-C        SURGICAL HISTORY:  Past Surgical History  Procedure Laterality Date  . Ptca  2006  . Coronary stent placement    . Permanent pacemaker insertion Bilateral 02/26/13    MDT Adapta L implanted by Dr Rayann Heman for sick sinus syndrome  . Radioactive seed implant  05/04/2004    8000 cGy, Dr Danny Lawless Dr Reece Agar  . Elbow surgery Bilateral 1973  . Insert / replace / remove pacemaker    . Coronary angioplasty     . Mediastinoscopy N/A 01/28/2014    Procedure: MEDIASTINOSCOPY;  Surgeon: Melrose Nakayama, MD;  Location: [facility];  Service: Thoracic;  Laterality: N/A;  . Permanent pacemaker insertion N/A 02/26/2013    Procedure: PERMANENT PACEMAKER INSERTION;  Surgeon: [name], MD;  Location: [facility] CATH LAB;  Service: Cardiovascular;  Laterality: N/A;    REVIEW OF SYSTEMS:  Constitutional: positive for fatigue Eyes: negative Ears, nose, mouth, throat, and face: negative Respiratory: negative Cardiovascular: negative Gastrointestinal: negative Genitourinary:negative Integument/breast: negative Hematologic/lymphatic: negative Musculoskeletal:negative Neurological: negative Behavioral/Psych: negative Endocrine: negative Allergic/Immunologic: negative   PHYSICAL EXAMINATION: General appearance: alert, cooperative, fatigued and no distress Head: Normocephalic,  without obvious abnormality, atraumatic Neck: no adenopathy, no carotid bruit, supple, symmetrical, trachea midline and thyroid not enlarged, symmetric, no tenderness/mass/nodules Lymph nodes: Cervical, supraclavicular, and axillary nodes normal. Resp: clear to auscultation bilaterally Back: symmetric, no curvature. ROM normal. No CVA tenderness. Cardio: regular rate and rhythm, S1, S2 normal, no murmur, click, rub or gallop GI: soft, non-tender; bowel sounds normal; no masses,  no organomegaly Extremities: extremities normal, atraumatic, no cyanosis or edema Neurologic: Alert and oriented X 3, normal strength and tone. Normal symmetric reflexes. Normal coordination and gait  ECOG PERFORMANCE STATUS: 1 - Symptomatic but completely ambulatory  Blood pressure 122/58, pulse 90, temperature 98.1 F (36.7 C), temperature source Oral, resp. rate 18, height 5\' 11"  (1.803 m), weight 195 lb 6.4 oz (88.633 kg), SpO2 94 %.  LABORATORY DATA: Lab Results  Component Value Date   WBC 4.3 07/29/2014   HGB 9.5* 07/29/2014   HCT 30.1* 07/29/2014     MCV 91.3 07/29/2014   PLT 175 07/29/2014      Chemistry      Component Value Date/Time   NA 142 07/29/2014 0842   NA 139 07/24/2014 0436   K 3.8 07/29/2014 0842   K 4.1 07/24/2014 0436   CL 102 07/24/2014 0436   CO2 23 07/29/2014 0842   CO2 26 07/24/2014 0436   BUN 16.0 07/29/2014 0842   BUN 16 07/24/2014 0436   CREATININE 0.7 07/29/2014 0842   CREATININE 0.74 07/24/2014 0436      Component Value Date/Time   CALCIUM 9.4 07/29/2014 0842   CALCIUM 9.3 07/24/2014 0436   ALKPHOS 68 07/29/2014 0842   ALKPHOS 66 07/21/2014 1821   AST 29 07/29/2014 0842   AST 43* 07/21/2014 1821   ALT 47 07/29/2014 0842   ALT 37 07/21/2014 1821   BILITOT 0.83 07/29/2014 0842   BILITOT 1.2 07/21/2014 1821       RADIOGRAPHIC STUDIES: Dg Chest 2 View  07/23/2014   CLINICAL DATA:  Subsequent encounter for shortness of breath getting worse for the past 8 months, severe today, personal history of pacemaker and myocardial infarction  EXAM: CHEST  2 VIEW  COMPARISON:  07/21/2014  FINDINGS: Heart size and vascular pattern are normal. Two lead cardiac pacer identified with generator over left thorax. Left lung is clear. There is infiltrate over the right medial lung from the level of the hilum to the diaphragm. Infiltrate extends laterally over the costophrenic angle on the right. No significant pleural effusion. When compared to study performed on 07/21/2014, infiltrate is mildly more extensive.  IMPRESSION: Increased slight mid to lower lung zone infiltrate concerning for pneumonia.   Electronically Signed   By: Skipper Cliche M.D.   On: 07/23/2014 12:12   Ct Angio Chest Pe W/cm &/or Wo Cm  07/14/2014   CLINICAL DATA:  Patient with diagnosis of non-small-cell lung cancer, currently undergoing palliative radiation therapy to right upper chest lesion. Now with worsening dyspnea. Evaluate for pulmonary embolism and/or pneumonitis.  EXAM: CT ANGIOGRAPHY CHEST WITH CONTRAST  TECHNIQUE: Multidetector CT imaging  of the chest was performed using the standard protocol during bolus administration of intravenous contrast. Multiplanar CT image reconstructions and MIPs were obtained to evaluate the vascular anatomy.  CONTRAST:  129mL OMNIPAQUE IOHEXOL 350 MG/ML SOLN  COMPARISON:  06/17/2014; 06/04/2014  FINDINGS: Vascular Findings:  There is suboptimal opacification of the pulmonary arterial system with the main pulmonary artery measuring only 198 Hounsfield units. There are no discrete filling defects within the pulmonary arterial tree to  the level of the bilateral subsegmental pulmonary arteries. Evaluation of the distal subsegmental pulmonary arteries is degraded secondary to a combination of suboptimal vessel opacification as well as patient respiratory artifact.  There is grossly unchanged moderate (approximately 50%) narrowing of the distal aspect of the right main pulmonary artery (coronal image 74, series 601) and occlusion of the right upper lobe pulmonary artery (coronal image 72) secondary to grossly unchanged right hilar and mediastinal adenopathy. Normal caliber of the main pulmonary artery.  Normal heart size. Trace amount of pericardial fluid, presumably physiologic. Coronary artery calcifications. Left anterior chest wall dual lead pacemaker with tips terminating within the right atrium and ventricle.  Scattered atherosclerotic plaque within a normal caliber thoracic aorta. No definite thoracic aortic dissection or periaortic stranding on this non gated examination.  There is unchanged narrowing of the SVC and central aspect of the left innominate vein secondary to grossly unchanged mediastinal adenopathy with associated filling of hypertrophied mediastinal and paraspinal venous collaterals.  Review of the MIP images confirms the above findings.   ----------------------------------------------------------------------------------  Nonvascular Findings:  Evaluation of the pulmonary parenchyma is degraded secondary  to patient respiratory artifact.  Previously identified spiculated nodules within right lung apex are unchanged with dominant nodule measuring 1.2 cm in greatest short axis diameter (image 22, series 8) and additional nodule within the right lung apex measuring approximately 1.0 cm (image 19, series 8).  Consolidative nodal conglomeration involving the right superior hilum is unchanged, measuring 1.9 x 1.9 cm (image 42, series 5). This finding is again associated with narrowing of the adjacent right main pulmonary artery as well as malignant occlusion of the right upper lobe pulmonary artery.  There is grossly unchanged ill-defined soft tissue mass within the anterior mediastinum which results in narrowing of the superior aspect of the SVC (image 34, series 5) as well as the central aspect of the left innominate vein (image 31, series 5) with associated filling of hypertrophied mediastinal and paraspinal venous collaterals.  Dominant pretracheal nodal conglomeration is grossly unchanged, measuring 2.2 cm in diameter (image 34, series 5).  There is a minimal amount of nonocclusive debris within distal aspect of the trachea (image 29, series 8). There is grossly unchanged narrowing of the right upper lobe bronchus (image 40, series 8) as well as the lateral and posterior basilar segmental bronchi of the right lower lobe (image 53, series 8). The remaining central bronchi appear patent.  Worsening bibasilar dependent ground-glass opacities, right greater than left with relative areas of consolidation within the right lower lobe (image 38 and 50, series 8). Mild diffuse interstitial thickening. No pleural effusion or pneumothorax.  Known metastasis involving the right adrenal gland appears morphologically similar the prior examination though is incompletely imaged (image 90, series 5). Hypoattenuating lesions within the dome of the liver are unchanged and again favored to represent hepatic cysts.  No definite acute or  aggressive osseous abnormalities. Regional soft tissues appear otherwise normal.  IMPRESSION: 1. No evidence of pulmonary embolism to the level of the bilateral subsegmental pulmonary arteries. 2. Unchanged moderate (approximately 50%) narrowing of the distal aspect of the right main pulmonary artery and occlusion of the right upper lobe pulmonary artery secondary to malignant right hilar adenopathy. 3. Unchanged narrowing of the superior aspect of the SVC and central aspect of the left innominate vein secondary to grossly unchanged mediastinal adenopathy with associated filling of multiple mediastinal and paraspinal venous collaterals. 4. Worsening bibasilar dependent opacities with geographic areas of confluence within the right lower  lung, possibly atelectasis though developing areas of infection (including atypical etiologies) could have a similar appearance. 5. Spiculated nodules within the right lung apex with associated right hilar and mediastinal adenopathy appears similar to the prior examination. 6. Grossly unchanged right adrenal gland metastasis, incompletely imaged.   Electronically Signed   By: Sandi Mariscal M.D.   On: 07/14/2014 17:16   Dg Chest Port 1 View  07/21/2014   CLINICAL DATA:  Chronic shortness of breath for 3 months. Hypertension. History of metastatic lung cancer.  EXAM: PORTABLE CHEST - 1 VIEW  COMPARISON:  07/14/2014, 06/02/2014  FINDINGS: Left subclavian 2 lead pacer noted. Normal heart size and vascularity. Worsening mid and lower lung patchy airspace process compatible with superimposed pneumonia. No effusion or pneumothorax. Trachea is midline. Atherosclerosis noted of the aorta. Chronic left posterior medial diaphragmatic hernia.  IMPRESSION: Right greater than left bibasilar streaky airspace process concerning for superimposed pneumonia.   Electronically Signed   By: Daryll Brod M.D.   On: 07/21/2014 18:41    ASSESSMENT AND PLAN: this is a very pleasant 79 years old white male  with stage IV non-small cell lung cancer, adenocarcinoma with positive PDL 1 expression, completed systemic chemotherapy with carboplatin and Alimta status post 6 cycles and tolerated his treatment fairly well.  The patient is feeling much better today and he is ready to start the first cycle of immunotherapy with [place]. We will proceed with the first cycle today as a scheduled. He would come back for follow-up visit in 3 weeks with the next cycle of his treatment. The patient was advised to call immediately if he has any concerning symptoms in the interval. The patient voices understanding of current disease status and treatment options and is in agreement with the current care plan.  All questions were answered. The patient knows to call the clinic with any problems, questions or concerns. We can certainly see the patient much sooner if necessary.  Disclaimer: This note was dictated with voice recognition software. Similar sounding words can inadvertently be transcribed and may not be corrected upon review.

## 2014-07-29 NOTE — Patient Instructions (Addendum)
[facility] Discharge Instructions for Patients Receiving Chemotherapy  Today you received the following chemotherapy agents Keytruda  To help prevent nausea and vomiting after your treatment, we encourage you to take your nausea medication as directed/prescribed   If you develop nausea and vomiting that is not controlled by your nausea medication, call the clinic.   BELOW ARE SYMPTOMS THAT SHOULD BE REPORTED IMMEDIATELY:  *FEVER GREATER THAN 100.5 F  *CHILLS WITH OR WITHOUT FEVER  NAUSEA AND VOMITING THAT IS NOT CONTROLLED WITH YOUR NAUSEA MEDICATION  *UNUSUAL SHORTNESS OF BREATH  *UNUSUAL BRUISING OR BLEEDING  TENDERNESS IN MOUTH AND THROAT WITH OR WITHOUT PRESENCE OF ULCERS  *URINARY PROBLEMS  *BOWEL PROBLEMS  UNUSUAL RASH Items with * indicate a potential emergency and should be followed up as soon as possible.  Feel free to call the clinic you have any questions or concerns. The clinic phone number is [phone].   Pembrolizumab injection What is this medicine? PEMBROLIZUMAB (pem broe liz ue mab) is used to treat certain types of melanoma, a skin cancer. It targets specific cancer cells and stops the cancer cells from growing. This medicine may be used for other purposes; ask your health care provider or pharmacist if you have questions. COMMON BRAND NAME(S): Keytruda What should I tell my health care provider before I take this medicine? They need to know if you have any of these conditions: -immune system problems -inflammatory bowel disease -liver disease -lung or breathing disease -lupus -an unusual or allergic reaction to pembrolizumab, other medicines, foods, dyes, or preservatives -pregnant or trying to get pregnant -breast-feeding How should I use this medicine? This medicine is for infusion into a vein. It is given by a health care professional in a hospital or clinic setting. A special MedGuide will be given to you before each  treatment. Be sure to read this information carefully each time. Talk to your pediatrician regarding the use of this medicine in children. Special care may be needed. Overdosage: If you think you've taken too much of this medicine contact a poison control center or emergency room at once. Overdosage: If you think you have taken too much of this medicine contact a poison control center or emergency room at once. NOTE: This medicine is only for you. Do not share this medicine with others. What if I miss a dose? It is important not to miss your dose. Call your doctor or health care professional if you are unable to keep an appointment. What may interact with this medicine? Interactions have not been studied. Give your health care provider a list of all the medicines, herbs, non-prescription drugs, or dietary supplements you use. Also tell them if you smoke, drink alcohol, or use illegal drugs. Some items may interact with your medicine. This list may not describe all possible interactions. Give your health care provider a list of all the medicines, herbs, non-prescription drugs, or dietary supplements you use. Also tell them if you smoke, drink alcohol, or use illegal drugs. Some items may interact with your medicine. What should I watch for while using this medicine? Your condition will be monitored carefully while you are receiving this medicine. You may need blood work done while you are taking this medicine. Do not become pregnant while taking this medicine or for 4 months after stopping it. Women should inform their doctor if they wish to become pregnant or think they might be pregnant. There is a potential for serious side effects to an unborn child.  Talk to your health care professional or pharmacist for more information. Do not breast-feed an infant while taking this medicine. What side effects may I notice from receiving this medicine? Side effects that you should report to your doctor or  health care professional as soon as possible: -allergic reactions like skin rash, itching or hives, swelling of the face, lips, or tongue -bloody or black, tarry stools -change in the amount of urine -changes in vision -chest pain -dark urine -dizziness or feeling faint or lightheaded -fast or irregular heartbeat -hair loss -muscle pain -muscle weakness -persistent headache -shortness of breath -signs and symptoms of liver injury like dark urine, light-colored stools, loss of appetite, nausea, right upper belly pain, yellowing of the eyes or skin -stomach pain -weight loss Side effects that usually do not require medical attention (Report these to your doctor or health care professional if they continue or are bothersome.):constipation -cough -diarrhea -joint pain -tiredness This list may not describe all possible side effects. Call your doctor for medical advice about side effects. You may report side effects to FDA at 1-800-FDA-1088. Where should I keep my medicine? This drug is given in a hospital or clinic and will not be stored at home. NOTE: This sheet is a summary. It may not cover all possible information. If you have questions about this medicine, talk to your doctor, pharmacist, or health care provider.  2015, Elsevier/Gold Standard. (2013-02-21 12:52:03)

## 2014-07-29 NOTE — Addendum Note (Signed)
Addended by: Ardeen Garland on: 07/29/2014 09:37 AM   Modules accepted: Orders, Medications

## 2014-07-29 NOTE — Telephone Encounter (Signed)
Per staff message and POF I have scheduled appts. Advised scheduler of appts. JMW  

## 2014-07-29 NOTE — Telephone Encounter (Signed)
Pt confirmed labs/ov per 02/15 POF, gave pt AVS..... KJ, sent msg to add chemo

## 2014-07-29 NOTE — Progress Notes (Signed)
Pt given handout for Keytruda.  RN reviewed side effects and symptom management via Teach Back method.

## 2014-07-30 ENCOUNTER — Telehealth: Payer: Self-pay | Admitting: *Deleted

## 2014-07-30 NOTE — Telephone Encounter (Signed)
Per Dr Vista Mink, pt can take tylenol or benadryl prn.  Pt aware to call back if he develops a fever.  No answer, left a message.

## 2014-07-30 NOTE — Telephone Encounter (Signed)
-----   Message from Geralynn Ochs, RN sent at 07/29/2014 10:08 AM EST ----- Regarding: chemo follow up Contact: [place]  Pt is hard of hearing

## 2014-07-30 NOTE — Telephone Encounter (Signed)
Patient left VM stating he had a nose bleed last night after getting home from [facility] for treatment.  Returned call to patient to get more information - he states "I felt a drip on my lip and looked in the mirror and there was a little bit of blood there." He states that it stopped after he applied some pressure for a minute or two. He states that it happened again this morning but stopped again after applying gentle pressure. He states he does use a wood stove for heat and was wondering if that caused it.  Told patient he could try using OTC saline spray and possibly a humidifier and see if that helps. Told patient to please call [place] back if he continues to get nosebleeds or notices any blood in his stool or urine or begins to bruise more easily. Patient agreeable to this.

## 2014-07-30 NOTE — Telephone Encounter (Signed)
Chemo follow up: Patient family member states that he is having chills with no fever. No other symptoms. Patient instructed to call in if fever is above 100.5. Patient verbalized understanding. Information forwarded to providers.

## 2014-07-31 ENCOUNTER — Telehealth: Payer: Self-pay | Admitting: *Deleted

## 2014-07-31 ENCOUNTER — Telehealth: Payer: Self-pay | Admitting: Family Medicine

## 2014-07-31 NOTE — Telephone Encounter (Signed)
Dr Maudie Mercury reviewed the message and stated the pt should go to the ER now and I called the pt and informed him of this and he stated he will go to the ER.

## 2014-07-31 NOTE — Telephone Encounter (Signed)
[facility] [place] Day - Client [place] [facility] Patient Name: Nicholas Schmitt DOB: May 30, 1936 Initial Comment Caller states he is a cancer PT of Dr. Inda Merlin. He had a nose bleed and has blood in his stool. Wants to know if he can be taken off his plavix. Nurse Assessment Nurse: Markus Daft, RN, Sherre Poot Date/Time (Eastern Time): 07/31/2014 4:45:53 PM Confirm and document reason for call. If symptomatic, describe symptoms. ---Caller states he had a nose bleed 2 days ago, and now he has blood in his stool today - about 1/4 cup. He had a hard stool just prior. No diarrhea. --He a cancer pt of Dr. Inda Merlin and MD wanted pt to call and see if his Plavix should be d/c'd. He had his dose of Plavix this AM. He is on it for stents in heart s/p MI in 2006. Has the patient traveled out of the country within the last 30 days? ---Not Applicable Does the patient require triage? ---Yes Related visit to physician within the last 2 weeks? ---No Does the PT have any chronic conditions? (i.e. diabetes, asthma, etc.) ---Yes List chronic conditions. ---He has CA lungs, kidney, spine, prostate, lymph nodes - he is on IV medication that is like chemo. MI, CAD, stents in heart Guidelines Guideline Title Affirmed Question Affirmed Notes Rectal Bleeding [1] Large amount of blood AND (2) adult stable Final Disposition User Go to ED Now Markus Daft, RN, [place] Comments RN advised that at ER they would tell him to stop or not stop the Plavix, and msg. would be sent to his MD. Osvaldo Human states that he plans to wait and see if he has anymore blood with his next stool before he goes to ER. He verbalized understanding of RN's advise to go to ER immediately. Elmo Putt -- aware at office and will be sure another doctor sees the msg since Dr. Sherren Mocha is away.

## 2014-07-31 NOTE — Telephone Encounter (Signed)
IT HAS BEEN TWO DAYS SINCE PT. HAS HAD A BOWEL MOVEMENT. THE STOOL WAS HARD. THERE WAS A SMALL AMOUNT OF BLOOD ON THE TISSUE WHEN PT. WIPED. VERBAL ORDER AND READ BACK TO DR.MOHAMED- PT. NEEDS TO STOP THE NAPROXEN AND CALL THE PHYSICIAN WHO PRESCRIBED THE PLAVIX. NOTIFIED PT. HE WILL CALL DR.[name] TODAY.

## 2014-08-04 ENCOUNTER — Ambulatory Visit: Payer: Medicare Other

## 2014-08-04 ENCOUNTER — Other Ambulatory Visit: Payer: Medicare Other

## 2014-08-07 ENCOUNTER — Ambulatory Visit
Admission: RE | Admit: 2014-08-07 | Discharge: 2014-08-07 | Disposition: A | Discharge status: Home / Self Care | Payer: Medicare Other | Source: Ambulatory Visit | Attending: Radiation Oncology | Admitting: Radiation Oncology

## 2014-08-07 ENCOUNTER — Encounter: Payer: Self-pay | Admitting: Radiation Oncology

## 2014-08-07 VITALS — BP 133/75 | HR 109 | Temp 97.6°F | Ht 71.0 in | Wt 191.3 lb

## 2014-08-07 DIAGNOSIS — C3491 Malignant neoplasm of unspecified part of right bronchus or lung: Secondary | ICD-10-CM

## 2014-08-07 MED ORDER — FLUCONAZOLE 100 MG PO TABS: 100.0000 mg | ORAL_TABLET | Freq: Every day | ORAL | Status: DC

## 2014-08-07 NOTE — Progress Notes (Signed)
Radiation Oncology         [phone] ________________________________  Name: Nicholas Schmitt MRN: [id number]  Date: 08/07/2014  DOB: 10-14-1935  Follow-Up Visit Note  CC: [name], MD  Raynelle Bring, MD    ICD-9-CM ICD-10-CM   1. Non-small cell carcinoma of lung, stage 4, right 162.9 C34.91     Diagnosis:   Stage IV non-small cell lung cancer  Interval Since Last Radiation:  4  weeks  Narrative:  The patient returns today for routine follow-up.  Interval history is significant for the patient being admitted to the hospital for breathing difficulties.  Since discharge the patient complains of pain with swallowing and difficulty. He reports a "lump in his lower throat".  His pain in the lower back area is improved. He does complain of some pain along the right scapular area.                              ALLERGIES:  has No Known Allergies.  Meds: Current Outpatient Prescriptions  Medication Sig Dispense Refill  . albuterol (PROVENTIL,VENTOLIN) 90 MCG/ACT inhaler Inhale 2 puffs into the lungs every 6 (six) hours as needed for wheezing or shortness of breath.    . budesonide-formoterol (SYMBICORT) 160-4.5 MCG/ACT inhaler Inhale 2 puffs into the lungs 2 (two) times daily.    [name] losartan (COZAAR) 50 MG tablet Take 0.5 tablets (25 mg total) by mouth daily.    . Multiple Minerals (CALCIUM/MAGNESIUM/ZINC PO) Take 1 capsule by mouth every evening.    . Multiple Vitamin (MULTIVITAMIN) tablet Take 1 tablet by mouth daily.     . naproxen sodium (ANAPROX) 220 MG tablet Take 440 mg by mouth 2 (two) times daily as needed (pain).     . nitroGLYCERIN (NITROSTAT) 0.4 MG SL tablet Place 0.4 mg under the tongue every 5 (five) minutes as needed for chest pain (MAX 3 TABLETS).    [name] oxyCODONE (OXY IR/ROXICODONE) 5 MG immediate release tablet Take 1-2 tablets (5-10 mg total) by mouth every 4 (four) hours as needed for moderate pain. 30 tablet 0  . prochlorperazine (COMPAZINE) 10 MG tablet  Take 1 tablet (10 mg total) by mouth every 6 (six) hours as needed for nausea or vomiting. 30 tablet 0  . sucralfate (CARAFATE) 1 G tablet Take 1 g by mouth 4 (four) times daily -  with meals and at bedtime. Dissolve tablets in 15-30 ml of water before taking.  Take half hour before meals and at bedtime.  Disp. 42 tablets. 0 refills. Called in to [facility] per Dr. Lisbeth Renshaw.    [name] amLODipine (NORVASC) 5 MG tablet   2  . clopidogrel (PLAVIX) 75 MG tablet Take 1 tablet (75 mg total) by mouth daily. (Patient not taking: Reported on 08/07/2014) 100 tablet 3  . fluconazole (DIFLUCAN) 100 MG tablet Take 1 tablet (100 mg total) by mouth daily. 8 tablet 0  . hyaluronate sodium (RADIAPLEXRX) GEL Apply 1 application topically once.    . predniSONE (DELTASONE) 10 MG tablet Starting 07/26/2014: Take 3 tablets daily 1 day, then 2 tablets daily 2 days, then 1 tablet daily 2 days, then stop. (Patient not taking: Reported on 08/07/2014) 9 tablet 0   No current facility-administered medications for this encounter.   Facility-Administered Medications Ordered in Other Encounters  Medication Dose Route Frequency Provider Last Rate Last Dose  . heparin lock flush 100 unit/mL  500 Units Intracatheter Daily PRN Carlton Adam,  PA-C      . sodium chloride 0.9 % injection 10 mL  10 mL Intracatheter PRN Adrena E Johnson, PA-C      . sodium chloride 0.9 % injection 3 mL  3 mL Intracatheter PRN Carlton Adam, PA-C        Physical Findings: The patient is in no acute distress. Patient is alert and oriented.  height is 5\' 11"  (1.803 m) and weight is 191 lb 4.8 oz (86.773 kg). His oral temperature is 97.6 F (36.4 C). His blood pressure is 133/75 and his pulse is 109. His oxygen saturation is 96%. .  Examination of oral cavity reveals the mucosa to be moist. The patient has a candidal infection along the posterior pharyngeal area. The lungs are clear. The heart has a regular rhythm with slightly increased rate. Palpation  along the right upper back and scapular area reveals no point tenderness.  Lab Findings: Lab Results  Component Value Date   WBC 4.3 07/29/2014   HGB 9.5* 07/29/2014   HCT 30.1* 07/29/2014   MCV 91.3 07/29/2014   PLT 175 07/29/2014    Radiographic Findings: Dg Chest 2 View  07/23/2014   CLINICAL DATA:  Subsequent encounter for shortness of breath getting worse for the past 8 months, severe today, personal history of pacemaker and myocardial infarction  EXAM: CHEST  2 VIEW  COMPARISON:  07/21/2014  FINDINGS: Heart size and vascular pattern are normal. Two lead cardiac pacer identified with generator over left thorax. Left lung is clear. There is infiltrate over the right medial lung from the level of the hilum to the diaphragm. Infiltrate extends laterally over the costophrenic angle on the right. No significant pleural effusion. When compared to study performed on 07/21/2014, infiltrate is mildly more extensive.  IMPRESSION: Increased slight mid to lower lung zone infiltrate concerning for pneumonia.   Electronically Signed   By: Skipper Cliche M.D.   On: 07/23/2014 12:12   Ct Angio Chest Pe W/cm &/or Wo Cm  07/14/2014   CLINICAL DATA:  Patient with diagnosis of non-small-cell lung cancer, currently undergoing palliative radiation therapy to right upper chest lesion. Now with worsening dyspnea. Evaluate for pulmonary embolism and/or pneumonitis.  EXAM: CT ANGIOGRAPHY CHEST WITH CONTRAST  TECHNIQUE: Multidetector CT imaging of the chest was performed using the standard protocol during bolus administration of intravenous contrast. Multiplanar CT image reconstructions and MIPs were obtained to evaluate the vascular anatomy.  CONTRAST:  151mL OMNIPAQUE IOHEXOL 350 MG/ML SOLN  COMPARISON:  06/17/2014; 06/04/2014  FINDINGS: Vascular Findings:  There is suboptimal opacification of the pulmonary arterial system with the main pulmonary artery measuring only 198 Hounsfield units. There are no discrete filling  defects within the pulmonary arterial tree to the level of the bilateral subsegmental pulmonary arteries. Evaluation of the distal subsegmental pulmonary arteries is degraded secondary to a combination of suboptimal vessel opacification as well as patient respiratory artifact.  There is grossly unchanged moderate (approximately 50%) narrowing of the distal aspect of the right main pulmonary artery (coronal image 74, series 601) and occlusion of the right upper lobe pulmonary artery (coronal image 72) secondary to grossly unchanged right hilar and mediastinal adenopathy. Normal caliber of the main pulmonary artery.  Normal heart size. Trace amount of pericardial fluid, presumably physiologic. Coronary artery calcifications. Left anterior chest wall dual lead pacemaker with tips terminating within the right atrium and ventricle.  Scattered atherosclerotic plaque within a normal caliber thoracic aorta. No definite thoracic aortic dissection or periaortic stranding on  this non gated examination.  There is unchanged narrowing of the SVC and central aspect of the left innominate vein secondary to grossly unchanged mediastinal adenopathy with associated filling of hypertrophied mediastinal and paraspinal venous collaterals.  Review of the MIP images confirms the above findings.   ----------------------------------------------------------------------------------  Nonvascular Findings:  Evaluation of the pulmonary parenchyma is degraded secondary to patient respiratory artifact.  Previously identified spiculated nodules within right lung apex are unchanged with dominant nodule measuring 1.2 cm in greatest short axis diameter (image 22, series 8) and additional nodule within the right lung apex measuring approximately 1.0 cm (image 19, series 8).  Consolidative nodal conglomeration involving the right superior hilum is unchanged, measuring 1.9 x 1.9 cm (image 42, series 5). This finding is again associated with narrowing of  the adjacent right main pulmonary artery as well as malignant occlusion of the right upper lobe pulmonary artery.  There is grossly unchanged ill-defined soft tissue mass within the anterior mediastinum which results in narrowing of the superior aspect of the SVC (image 34, series 5) as well as the central aspect of the left innominate vein (image 31, series 5) with associated filling of hypertrophied mediastinal and paraspinal venous collaterals.  Dominant pretracheal nodal conglomeration is grossly unchanged, measuring 2.2 cm in diameter (image 34, series 5).  There is a minimal amount of nonocclusive debris within distal aspect of the trachea (image 29, series 8). There is grossly unchanged narrowing of the right upper lobe bronchus (image 40, series 8) as well as the lateral and posterior basilar segmental bronchi of the right lower lobe (image 53, series 8). The remaining central bronchi appear patent.  Worsening bibasilar dependent ground-glass opacities, right greater than left with relative areas of consolidation within the right lower lobe (image 38 and 50, series 8). Mild diffuse interstitial thickening. No pleural effusion or pneumothorax.  Known metastasis involving the right adrenal gland appears morphologically similar the prior examination though is incompletely imaged (image 90, series 5). Hypoattenuating lesions within the dome of the liver are unchanged and again favored to represent hepatic cysts.  No definite acute or aggressive osseous abnormalities. Regional soft tissues appear otherwise normal.  IMPRESSION: 1. No evidence of pulmonary embolism to the level of the bilateral subsegmental pulmonary arteries. 2. Unchanged moderate (approximately 50%) narrowing of the distal aspect of the right main pulmonary artery and occlusion of the right upper lobe pulmonary artery secondary to malignant right hilar adenopathy. 3. Unchanged narrowing of the superior aspect of the SVC and central aspect of the  left innominate vein secondary to grossly unchanged mediastinal adenopathy with associated filling of multiple mediastinal and paraspinal venous collaterals. 4. Worsening bibasilar dependent opacities with geographic areas of confluence within the right lower lung, possibly atelectasis though developing areas of infection (including atypical etiologies) could have a similar appearance. 5. Spiculated nodules within the right lung apex with associated right hilar and mediastinal adenopathy appears similar to the prior examination. 6. Grossly unchanged right adrenal gland metastasis, incompletely imaged.   Electronically Signed   By: Sandi Mariscal M.D.   On: 07/14/2014 17:16   Dg Chest Port 1 View  07/21/2014   CLINICAL DATA:  Chronic shortness of breath for 3 months. Hypertension. History of metastatic lung cancer.  EXAM: PORTABLE CHEST - 1 VIEW  COMPARISON:  07/14/2014, 06/02/2014  FINDINGS: Left subclavian 2 lead pacer noted. Normal heart size and vascularity. Worsening mid and lower lung patchy airspace process compatible with superimposed pneumonia. No effusion or pneumothorax. Trachea is  midline. Atherosclerosis noted of the aorta. Chronic left posterior medial diaphragmatic hernia.  IMPRESSION: Right greater than left bibasilar streaky airspace process concerning for superimposed pneumonia.   Electronically Signed   By: Daryll Brod M.D.   On: 07/21/2014 18:41    Impression:  Stage IV non-small cell lung cancer status post palliative radiation therapy to the right central chest and lower back region. He has had improvement in his painfrom osseous metastasis as above. Patient appears to have a candidial infection at this time which would be explaining some of his swallowing difficulties.  Plan:  Diflucan prescription. He will continue on Carafate . The patient will return for follow-up in 2 weeks. Patient is tentatively scheduled for a chemotherapy on March 7  ____________________________________ Blair Promise, MD

## 2014-08-07 NOTE — Progress Notes (Signed)
Nicholas Schmitt here for follow up after treatment to his right central chest and lower lumbar/upper sacrum area.  He reports pain in his right shoulder/neck that he rates at a 5/10.  He reports his lower back is better.  He takes aleve once a day and takes oxycodone once a day.  He was in the hospital 07/21/14 - 07/25/14 with pneumonitis.  He reports having a "lump" in his throat and is only able to eat soft foods like ice cream.  He has lost 4 lb since 2/16 and also reports a poor appetite.  He reports having nausea and vomiting for the last 2 days.   He also reports burping frequently.  He also wakes up 3 times a night with his mouth feeling very dry.  He is taking Carafate before meals.  Orthostatic vital signs done: bp sitting 133/75, hr 109, bp standing 104/59, hr 101.  He reports a cough with yellow sputum.  He reports his shortness of breath is better.  He does have oxygen at home if he needs it  He reports his skin is intact on his chest and back.  BP 133/75 mmHg  Pulse 109  Temp(Src) 97.6 F (36.4 C) (Oral)  Ht 5\' 11"  (1.803 m)  Wt 191 lb 4.8 oz (86.773 kg)  BMI 26.69 kg/m2  SpO2 96%

## 2014-08-11 ENCOUNTER — Ambulatory Visit: Payer: Medicare Other

## 2014-08-11 ENCOUNTER — Encounter: Payer: Medicare Other | Admitting: Nutrition

## 2014-08-11 ENCOUNTER — Other Ambulatory Visit: Payer: Medicare Other

## 2014-08-13 ENCOUNTER — Telehealth: Payer: Self-pay | Admitting: Family Medicine

## 2014-08-13 NOTE — Telephone Encounter (Signed)
Nicholas Schmitt would like to extend pt OT visit this week to one additional visit and next wk Nicholas Schmitt would like pt to have 2 visits. Ok to leave on vm

## 2014-08-13 NOTE — Telephone Encounter (Signed)
Dr Sherren Mocha did not order OT

## 2014-08-15 NOTE — Telephone Encounter (Signed)
Nicholas Schmitt states the hospitalist ordered the OT, but it is the PCP who the order generally comes back to, and sign the 485. Nicholas Schmitt states if dr todd will not sign, it might affect all his orders. She wiuls kike dr todd to please reconsider. Dr Algis Liming who did initial at hospital.

## 2014-08-15 NOTE — Telephone Encounter (Signed)
Dr Sherren Mocha did not order or has not seen this patient since he has been hospitalized.  [facility] already informed of this.

## 2014-08-18 ENCOUNTER — Ambulatory Visit: Payer: Medicare Other

## 2014-08-18 ENCOUNTER — Encounter: Payer: Self-pay | Admitting: Oncology

## 2014-08-18 ENCOUNTER — Telehealth: Payer: Self-pay | Admitting: *Deleted

## 2014-08-18 ENCOUNTER — Ambulatory Visit (HOSPITAL_COMMUNITY)
Admission: RE | Admit: 2014-08-18 | Discharge: 2014-08-18 | Disposition: A | Discharge status: Home / Self Care | Payer: Medicare Other | Source: Ambulatory Visit | Attending: Internal Medicine | Admitting: Internal Medicine

## 2014-08-18 ENCOUNTER — Ambulatory Visit (HOSPITAL_BASED_OUTPATIENT_CLINIC_OR_DEPARTMENT_OTHER): Payer: Medicare Other

## 2014-08-18 ENCOUNTER — Other Ambulatory Visit (HOSPITAL_BASED_OUTPATIENT_CLINIC_OR_DEPARTMENT_OTHER): Payer: Medicare Other | Admitting: *Deleted

## 2014-08-18 ENCOUNTER — Ambulatory Visit (HOSPITAL_BASED_OUTPATIENT_CLINIC_OR_DEPARTMENT_OTHER): Payer: Medicare Other | Admitting: Oncology

## 2014-08-18 ENCOUNTER — Telehealth: Payer: Self-pay | Admitting: Oncology

## 2014-08-18 ENCOUNTER — Other Ambulatory Visit (HOSPITAL_BASED_OUTPATIENT_CLINIC_OR_DEPARTMENT_OTHER): Payer: Medicare Other

## 2014-08-18 ENCOUNTER — Ambulatory Visit: Payer: Medicare Other | Admitting: Nutrition

## 2014-08-18 ENCOUNTER — Other Ambulatory Visit: Payer: Self-pay | Admitting: Medical Oncology

## 2014-08-18 ENCOUNTER — Telehealth: Payer: Self-pay | Admitting: Physician Assistant

## 2014-08-18 VITALS — BP 101/66 | HR 83 | Temp 97.7°F | Resp 18 | Ht 71.0 in | Wt 189.9 lb

## 2014-08-18 DIAGNOSIS — D630 Anemia in neoplastic disease: Secondary | ICD-10-CM | POA: Diagnosis not present

## 2014-08-18 DIAGNOSIS — F419 Anxiety disorder, unspecified: Secondary | ICD-10-CM | POA: Diagnosis not present

## 2014-08-18 DIAGNOSIS — D6481 Anemia due to antineoplastic chemotherapy: Secondary | ICD-10-CM

## 2014-08-18 DIAGNOSIS — M25519 Pain in unspecified shoulder: Secondary | ICD-10-CM | POA: Diagnosis not present

## 2014-08-18 DIAGNOSIS — C3411 Malignant neoplasm of upper lobe, right bronchus or lung: Secondary | ICD-10-CM

## 2014-08-18 DIAGNOSIS — C3491 Malignant neoplasm of unspecified part of right bronchus or lung: Secondary | ICD-10-CM | POA: Insufficient documentation

## 2014-08-18 DIAGNOSIS — T451X5A Adverse effect of antineoplastic and immunosuppressive drugs, initial encounter: Principal | ICD-10-CM

## 2014-08-18 DIAGNOSIS — Z5112 Encounter for antineoplastic immunotherapy: Secondary | ICD-10-CM | POA: Diagnosis not present

## 2014-08-18 LAB — COMPREHENSIVE METABOLIC PANEL (CC13)
ALT: 20 U/L (ref 0–55)
AST: 20 U/L (ref 5–34)
Albumin: 2.5 g/dL — ABNORMAL LOW (ref 3.5–5.0)
Alkaline Phosphatase: 75 U/L (ref 40–150)
Anion Gap: 9 mEq/L (ref 3–11)
BUN: 13.8 mg/dL (ref 7.0–26.0)
CALCIUM: 9.5 mg/dL (ref 8.4–10.4)
CHLORIDE: 107 meq/L (ref 98–109)
CO2: 24 meq/L (ref 22–29)
Creatinine: 0.8 mg/dL (ref 0.7–1.3)
EGFR: 88 mL/min/{1.73_m2} — AB (ref >90)
Glucose: 124 mg/dl (ref 70–140)
Potassium: 4.3 mEq/L (ref 3.5–5.1)
SODIUM: 141 meq/L (ref 136–145)
TOTAL PROTEIN: 6.4 g/dL (ref 6.4–8.3)
Total Bilirubin: 0.86 mg/dL (ref 0.20–1.20)

## 2014-08-18 LAB — CBC WITH DIFFERENTIAL/PLATELET
BASO%: 0.6 % (ref 0.0–2.0)
Basophils Absolute: 0 10*3/uL (ref 0.0–0.1)
EOS%: 4.4 % (ref 0.0–7.0)
Eosinophils Absolute: 0.2 10*3/uL (ref 0.0–0.5)
HCT: 25 % — ABNORMAL LOW (ref 38.4–49.9)
HGB: 7.8 g/dL — ABNORMAL LOW (ref 13.0–17.1)
LYMPH%: 8.9 % — ABNORMAL LOW (ref 14.0–49.0)
MCH: 28.6 pg (ref 27.2–33.4)
MCHC: 31.4 g/dL — ABNORMAL LOW (ref 32.0–36.0)
MCV: 91.1 fL (ref 79.3–98.0)
MONO#: 0.6 10*3/uL (ref 0.1–0.9)
MONO%: 12.3 % (ref 0.0–14.0)
NEUT#: 3.4 10*3/uL (ref 1.5–6.5)
NEUT%: 73.8 % (ref 39.0–75.0)
Platelets: 216 10*3/uL (ref 140–400)
RBC: 2.74 10*6/uL — ABNORMAL LOW (ref 4.20–5.82)
RDW: 20.3 % — AB (ref 11.0–14.6)
WBC: 4.6 10*3/uL (ref 4.0–10.3)
lymph#: 0.4 10*3/uL — ABNORMAL LOW (ref 0.9–3.3)

## 2014-08-18 LAB — HOLD TUBE, BLOOD BANK

## 2014-08-18 LAB — IRON AND TIBC CHCC
%SAT: 19 % — AB (ref 20–55)
IRON: 37 ug/dL — AB (ref 42–163)
TIBC: 200 ug/dL — AB (ref 202–409)
UIBC: 163 ug/dL (ref 117–376)

## 2014-08-18 LAB — FERRITIN CHCC: Ferritin: 1062 ng/ml — ABNORMAL HIGH (ref 22–316)

## 2014-08-18 LAB — TSH CHCC: TSH: 2.739 m(IU)/L (ref 0.320–4.118)

## 2014-08-18 MED ORDER — PROCHLORPERAZINE MALEATE 10 MG PO TABS
10.0000 mg | ORAL_TABLET | Freq: Once | ORAL | Status: AC
  Administered 2014-08-18: 10 mg via ORAL

## 2014-08-18 MED ORDER — SODIUM CHLORIDE 0.9 % IV SOLN
Freq: Once | INTRAVENOUS | Status: AC
  Administered 2014-08-18: 11:00:00 via INTRAVENOUS

## 2014-08-18 MED ORDER — SODIUM CHLORIDE 0.9 % IV SOLN
2.0000 mg/kg | Freq: Once | INTRAVENOUS | Status: AC
  Administered 2014-08-18: 200 mg via INTRAVENOUS
  Filled 2014-08-18: qty 8

## 2014-08-18 MED ORDER — PROCHLORPERAZINE MALEATE 10 MG PO TABS
ORAL_TABLET | ORAL | Status: AC
  Filled 2014-08-18: qty 1

## 2014-08-18 MED ORDER — ALPRAZOLAM 0.25 MG PO TABS: 0.2500 mg | ORAL_TABLET | Freq: Every evening | ORAL | Status: DC | PRN

## 2014-08-18 NOTE — Telephone Encounter (Signed)
Pt confirmed labs/ov per 03/07 POF, gave pt AVS... KJ, sent msg to add chemo and blood transfusion.Marland KitchenMarland KitchenMarland Kitchen

## 2014-08-18 NOTE — Telephone Encounter (Signed)
Per staff message and POF I have scheduled appts. Advised scheduler of appts. JMW  

## 2014-08-18 NOTE — Telephone Encounter (Signed)
Msg from [facility] states no availability for blood transfusion sent msg to NP/CK and desk nurse to schedule pt with [facility] and to contact pt with D/T, also pt is requesting same day as his visit with Dr. Sondra Come on 03/10.... KJ

## 2014-08-18 NOTE — Patient Instructions (Signed)
Pembrolizumab injection What is this medicine? PEMBROLIZUMAB (pem broe liz ue mab) is used to treat certain types of melanoma, a skin cancer. It targets specific cancer cells and stops the cancer cells from growing. This medicine may be used for other purposes; ask your health care provider or pharmacist if you have questions. COMMON BRAND NAME(S): Keytruda What should I tell my health care provider before I take this medicine? They need to know if you have any of these conditions: -immune system problems -inflammatory bowel disease -liver disease -lung or breathing disease -lupus -an unusual or allergic reaction to pembrolizumab, other medicines, foods, dyes, or preservatives -pregnant or trying to get pregnant -breast-feeding How should I use this medicine? This medicine is for infusion into a vein. It is given by a health care professional in a hospital or clinic setting. A special MedGuide will be given to you before each treatment. Be sure to read this information carefully each time. Talk to your pediatrician regarding the use of this medicine in children. Special care may be needed. Overdosage: If you think you've taken too much of this medicine contact a poison control center or emergency room at once. Overdosage: If you think you have taken too much of this medicine contact a poison control center or emergency room at once. NOTE: This medicine is only for you. Do not share this medicine with others. What if I miss a dose? It is important not to miss your dose. Call your doctor or health care professional if you are unable to keep an appointment. What may interact with this medicine? Interactions have not been studied. Give your health care provider a list of all the medicines, herbs, non-prescription drugs, or dietary supplements you use. Also tell them if you smoke, drink alcohol, or use illegal drugs. Some items may interact with your medicine. This list may not describe all  possible interactions. Give your health care provider a list of all the medicines, herbs, non-prescription drugs, or dietary supplements you use. Also tell them if you smoke, drink alcohol, or use illegal drugs. Some items may interact with your medicine. What should I watch for while using this medicine? Your condition will be monitored carefully while you are receiving this medicine. You may need blood work done while you are taking this medicine. Do not become pregnant while taking this medicine or for 4 months after stopping it. Women should inform their doctor if they wish to become pregnant or think they might be pregnant. There is a potential for serious side effects to an unborn child. Talk to your health care professional or pharmacist for more information. Do not breast-feed an infant while taking this medicine. What side effects may I notice from receiving this medicine? Side effects that you should report to your doctor or health care professional as soon as possible: -allergic reactions like skin rash, itching or hives, swelling of the face, lips, or tongue -bloody or black, tarry stools -change in the amount of urine -changes in vision -chest pain -dark urine -dizziness or feeling faint or lightheaded -fast or irregular heartbeat -hair loss -muscle pain -muscle weakness -persistent headache -shortness of breath -signs and symptoms of liver injury like dark urine, light-colored stools, loss of appetite, nausea, right upper belly pain, yellowing of the eyes or skin -stomach pain -weight loss Side effects that usually do not require medical attention (Report these to your doctor or health care professional if they continue or are bothersome.):constipation -cough -diarrhea -joint pain -tiredness This list  may not describe all possible side effects. Call your doctor for medical advice about side effects. You may report side effects to FDA at 1-800-FDA-1088. Where should I keep  my medicine? This drug is given in a hospital or clinic and will not be stored at home. NOTE: This sheet is a summary. It may not cover all possible information. If you have questions about this medicine, talk to your doctor, pharmacist, or health care provider.  2015, Elsevier/Gold Standard. (2013-02-21 12:52:03)

## 2014-08-18 NOTE — Progress Notes (Signed)
79 y/o male with Dx of NSCLC stage IV receiving chemo.   PMH include Prostat CA, HTN, CAD, HLD, AAA, MI, pacemaker, CRI, and COPD  Meds include folvite, MVI, Prednisone, Compazine, Carafate   Labs include 08/18/14 Alb. 2.5 L  Ht 5'11 Wt 189# 14.4oz (08/18/14)  UBW 220#  BMI 26.50  Pt reports decreased appetite and po intake.  14% wt loss from UBW. Pt states mouth was sore, but better now.  Eating a good breakfast, but lunch and dinner <1/2 of normal size.  Drinking OJ, gatoraide, and an occasional milk shake or Boost.   Nutrition Dx:  Food and nutrition related knowledge deficit related to new diagnosis of Lung CA AEB no prior need for nutrition related information.    Intervention:  Educated pt to increased po intake by having small frequent high kcal/high protein meals and snacks. Eat every 2-3 hours. Recommend use of shakes, smoothies, and commercial supplement for added protein and kcals.  Reviewed this importance of good nutrition and wt maintenance during treatment.  Questions were answered. Teach back method used.   Monitoring, evaluation, goals:  Pt will work to increase oral intake to minimize wt loss.   Next visit to be scheduled during chemo.

## 2014-08-18 NOTE — Progress Notes (Signed)
Nicholas [phone]   [phone]  OFFICE PROGRESS NOTE  [name],Nicholas Schmitt Nicholas Resides, MD Nicholas Schmitt [id number]  DIAGNOSIS: Non-small cell carcinoma of lung, stage 4  Primary site: Lung (Right)  Staging method: AJCC 7th Edition  Clinical: Stage IV (T1a, N3, M1b) signed by Curt Bears, MD on 02/01/2014 1:42 PM  Summary: Stage IV (T1a, N3, M1b) Prostate cancer  Primary site: Prostate  Clinical: Stage I (T1c, NX, M0) signed by Nicholas Portela, MD on 08/06/2013 1:59 PM  Summary: Stage I (T1c, NX, M0)  PRIOR THERAPY:  1) Systemic chemotherapy with carboplatin for an AUC of 5 and Alimta 500 mg per meter squared given every 3 weeks. Status post 6 cycles, last dose was given 05/20/2014 discontinued secondary to disease progression. 2) palliative radiotherapy to the right lung and lumbar spines under the care of Dr. Sondra Come.  CURRENT THERAPY: Immunotherapy with Ketruda (pembrolizumab) 2 MG/KG every 3 weeks. First dose 07/29/2014. He has positive PDL 1 expression (90%).  INTERVAL HISTORY: Nicholas Schmitt 79 y.o. male returns to the clinic today for follow-up visit accompanied by his wife. The patient received his first cycle of his immunotherapy with Nicholas Schmitt (pembrolizumab) on 08/18/2014. He tolerated the first dose well. He is feeling fine today, but does report increased fatigue and dyspnea with minimal exertion. He denied having any significant fever or chills, no nausea or vomiting. The patient denied having any significant chest pain, cough or hemoptysis. He denies any obvious bleeding, but reports that his stools are dark in color. He does take iron tablets twice a day. He also reports right shoulder pain and has been using Aleve for this. Reports is been going on for about one week. It is not really getting better with the Aleve. He does have oxycodone at home but he has not tried this. He does have a decreased appetite and has  lost about 6 pounds since we last saw him 3 weeks ago. He reports he drinks about 1 can of boost per day. He is scheduled to meet with our dietitian later today. His wife has reported that he is more anxious at times and has requested medication for this.  MEDICAL HISTORY: Past Medical History  Diagnosis Date  . Hypertension   . CAD (coronary artery disease)     positive stress test in 2006 led to left heart cath (8/06) showing 95% prox RCA, 90% CFX, and 90% mLAD.  patient had a cypher DES to all 3 lesions  . Hyperlipidemia   . Hearing loss   . AAA (abdominal aortic aneurysm) 11/09    3.2 cm   . Chronic renal insufficiency   . History of radiation therapy 03/08/04- 04/08/04    prostate 4600 cGy in 3 fractions, radioactive seed implant 05/04/04  . Myocardial infarction 1995  . Pacemaker 2014  . Dysrhythmia   . Shortness of breath     exertion  . Asthma     bronchial  . COPD (chronic obstructive pulmonary disease)   . Cancer 2015    prostate   . Prostate cancer 2005    gleason 7  . Skin cancer     ear  . Lung cancer   . Lung cancer   . Radiation Jan.11-Jan 29/2016    Right central chest 35 gray in 14 fractions  . Radiation Jan.2016    35 gray in 14 fx lower lumbar/upper sacrum    ALLERGIES:  has No Known  Allergies.  MEDICATIONS:  Current Outpatient Prescriptions  Medication Sig Dispense Refill  . albuterol (PROVENTIL,VENTOLIN) 90 MCG/ACT inhaler Inhale 2 puffs into the lungs every 6 (six) hours as needed for wheezing or shortness of breath.    . budesonide-formoterol (SYMBICORT) 160-4.5 MCG/ACT inhaler Inhale 2 puffs into the lungs 2 (two) times daily.    . hyaluronate sodium (RADIAPLEXRX) GEL Apply 1 application topically once.    . Multiple Vitamin (MULTIVITAMIN) tablet Take 1 tablet by mouth daily.     . naproxen sodium (ANAPROX) 220 MG tablet Take 440 mg by mouth 2 (two) times daily as needed (pain).     . nitroGLYCERIN (NITROSTAT) 0.4 MG SL tablet Place 0.4 mg under  the tongue every 5 (five) minutes as needed for chest pain (MAX 3 TABLETS).    [name] oxyCODONE (OXY IR/ROXICODONE) 5 MG immediate release tablet Take 1-2 tablets (5-10 mg total) by mouth every 4 (four) hours as needed for moderate pain. 30 tablet 0  . predniSONE (DELTASONE) 10 MG tablet Starting 07/26/2014: Take 3 tablets daily 1 day, then 2 tablets daily 2 days, then 1 tablet daily 2 days, then stop. (Patient not taking: Reported on 08/07/2014) 9 tablet 0  . prochlorperazine (COMPAZINE) 10 MG tablet Take 1 tablet (10 mg total) by mouth every 6 (six) hours as needed for nausea or vomiting. 30 tablet 0  . sucralfate (CARAFATE) 1 G tablet Take 1 g by mouth 4 (four) times daily -  with meals and at bedtime. Dissolve tablets in 15-30 ml of water before taking.  Take half hour before meals and at bedtime.  Disp. 42 tablets. 0 refills. Called in to [facility] per Dr. Lisbeth Renshaw.     No current facility-administered medications for this visit.   Facility-Administered Medications Ordered in Other Visits  Medication Dose Route Frequency Provider Last Rate Last Dose  . heparin lock flush 100 unit/mL  500 Units Intracatheter Daily PRN Adrena E Johnson, PA-C      . sodium chloride 0.9 % injection 10 mL  10 mL Intracatheter PRN Adrena E Johnson, PA-C      . sodium chloride 0.9 % injection 3 mL  3 mL Intracatheter PRN Carlton Adam, PA-C        SURGICAL HISTORY:  Past Surgical History  Procedure Laterality Date  . Ptca  2006  . Coronary stent placement    . Permanent pacemaker insertion Bilateral 02/26/13    MDT Adapta L implanted by Dr Rayann Heman for sick sinus syndrome  . Radioactive seed implant  05/04/2004    8000 cGy, Dr Danny Lawless Dr Reece Agar  . Elbow surgery Bilateral 1973  . Insert / replace / remove pacemaker    . Coronary angioplasty    . Mediastinoscopy N/A 01/28/2014    Procedure: MEDIASTINOSCOPY;  Surgeon: Melrose Nakayama, MD;  Location: [place];  Service: Thoracic;  Laterality: N/A;  .  Permanent pacemaker insertion N/A 02/26/2013    Procedure: PERMANENT PACEMAKER INSERTION;  Surgeon: [name], MD;  Location: [place] CATH [facility];  Service: Cardiovascular;  Laterality: N/A;    REVIEW OF SYSTEMS:  Constitutional: positive for anorexia and fatigue Eyes: negative Ears, nose, mouth, throat, and face: negative Respiratory: positive for dyspnea on exertion, negative for cough and hemoptysis Cardiovascular: negative Gastrointestinal: negative Genitourinary:negative Integument/breast: negative Hematologic/lymphatic: negative for bleeding Musculoskeletal:positive for arthralgias, negative for back pain, bone pain and muscle weakness Neurological: negative Behavioral/Psych: positive for anxiety, negative for depression and sleep disturbance Endocrine: negative Allergic/Immunologic: negative   PHYSICAL  EXAMINATION: General appearance: alert, cooperative, fatigued and no distress Head: Normocephalic, without obvious abnormality, atraumatic Neck: no adenopathy, no carotid bruit, supple, symmetrical, trachea midline and thyroid not enlarged, symmetric, no tenderness/mass/nodules Lymph nodes: Cervical, supraclavicular, and axillary nodes normal. Resp: clear to auscultation bilaterally Back: symmetric, no curvature. ROM normal. No CVA tenderness. Cardio: regular rate and rhythm, S1, S2 normal, no murmur, click, rub or gallop GI: soft, non-tender; bowel sounds normal; no masses,  no organomegaly Extremities: extremities normal, atraumatic, no cyanosis or edema Neurologic: Alert and oriented X 3, normal strength and tone. Normal symmetric reflexes. Normal coordination and gait  ECOG PERFORMANCE STATUS: 1 - Symptomatic but completely ambulatory  Blood pressure 101/66, pulse 83, temperature 97.7 F (36.5 C), temperature source Oral, resp. rate 18, height 5\' 11"  (1.803 m), weight 189 lb 14.4 oz (86.138 kg).  LABORATORY DATA: Lab Results  Component Value Date   WBC 4.6 08/18/2014   HGB  7.8* 08/18/2014   HCT 25.0* 08/18/2014   MCV 91.1 08/18/2014   PLT 216 08/18/2014      Chemistry      Component Value Date/Time   NA 142 07/29/2014 0842   NA 139 07/24/2014 0436   K 3.8 07/29/2014 0842   K 4.1 07/24/2014 0436   CL 102 07/24/2014 0436   CO2 23 07/29/2014 0842   CO2 26 07/24/2014 0436   BUN 16.0 07/29/2014 0842   BUN 16 07/24/2014 0436   CREATININE 0.7 07/29/2014 0842   CREATININE 0.74 07/24/2014 0436      Component Value Date/Time   CALCIUM 9.4 07/29/2014 0842   CALCIUM 9.3 07/24/2014 0436   ALKPHOS 68 07/29/2014 0842   ALKPHOS 66 07/21/2014 1821   AST 29 07/29/2014 0842   AST 43* 07/21/2014 1821   ALT 47 07/29/2014 0842   ALT 37 07/21/2014 1821   BILITOT 0.83 07/29/2014 0842   BILITOT 1.2 07/21/2014 1821       RADIOGRAPHIC STUDIES: Dg Chest 2 View  07/23/2014   CLINICAL DATA:  Subsequent encounter for shortness of breath getting worse for the past 8 months, severe today, personal history of pacemaker and myocardial infarction  EXAM: CHEST  2 VIEW  COMPARISON:  07/21/2014  FINDINGS: Heart size and vascular pattern are normal. Two lead cardiac pacer identified with generator over left thorax. Left lung is clear. There is infiltrate over the right medial lung from the level of the hilum to the diaphragm. Infiltrate extends laterally over the costophrenic angle on the right. No significant pleural effusion. When compared to study performed on 07/21/2014, infiltrate is mildly more extensive.  IMPRESSION: Increased slight mid to lower lung zone infiltrate concerning for pneumonia.   Electronically Signed   By: Skipper Cliche M.D.   On: 07/23/2014 12:12   Dg Chest Port 1 View  07/21/2014   CLINICAL DATA:  Chronic shortness of breath for 3 months. Hypertension. History of metastatic lung cancer.  EXAM: PORTABLE CHEST - 1 VIEW  COMPARISON:  07/14/2014, 06/02/2014  FINDINGS: Left subclavian 2 lead pacer noted. Normal heart size and vascularity. Worsening mid and lower  lung patchy airspace process compatible with superimposed pneumonia. No effusion or pneumothorax. Trachea is midline. Atherosclerosis noted of the aorta. Chronic left posterior medial diaphragmatic hernia.  IMPRESSION: Right greater than left bibasilar streaky airspace process concerning for superimposed pneumonia.   Electronically Signed   By: Daryll Brod M.D.   On: 07/21/2014 18:41    ASSESSMENT AND PLAN: this is a very pleasant 79 year old white  male with stage IV non-small cell lung cancer, adenocarcinoma with positive PDL 1 expression, completed systemic chemotherapy with carboplatin and Alimta status post 6 cycles and tolerated his treatment fairly well.   The patient was seen and examined with Dr. Julien Nordmann. Recommend that he proceed with cycle 2 of immunotherapy with Keytruda.  The patient's hemoglobin today is 7.8 and he is symptomatic with increased fatigue and dyspnea on exertion. He will be set up for 2 units of packed red blood cells later this week. We will go ahead and type and cross him today. We have been able to secure spot for him on March 9 at the [facility] and orders have been entered. We will also check iron studies and he was given stool cards to check for occult blood.  For his shoulder pain, he may use his oxycodone as needed. Review of his recent x-rays and scans do not show any evidence of bone metastases in this area.  He was also given a prescription for Xanax 0.25 mg at bedtime as needed. He was advised to call us if it is not effective or if he develops any side effects from this medication.  Return visit will be scheduled in 3 weeks for cycle 3 of his Keytruda.  The patient was advised to call immediately if he has any concerning symptoms in the interval. The patient voices understanding of current disease status and treatment options and is in agreement with the current care plan.  All questions were answered. The patient knows to call the clinic with any  problems, questions or concerns. We can certainly see the patient much sooner if necessary.  Mikey Bussing, DNP, AGPCNP-BC, AOCNP  ADDENDUM: Hematology/Oncology Attending: I had a face to face encounter with the patient. I recommended his care plan. This is a very pleasant 79 years old white male with metastatic non-small cell lung cancer currently undergoing immunotherapy with Ketruda (pembrolizumab) status post 1 cycle and tolerated his treatment fairly well. The patient complains of increasing fatigue and shortness breath with exertion secondary to chemotherapy-induced anemia. We will arrange for him to receive 2 units of PRBCs transfusion later this week. I recommended for the patient to continue with his immunotherapy today as a scheduled. He would come back for follow-up visit in 3 weeks for reevaluation and management of any adverse effect of his treatment. He was advised to call immediately if he has any concerning symptoms in the interval.  Disclaimer: This note was dictated with voice recognition software. Similar sounding words can inadvertently be transcribed and may be missed upon review. Eilleen Kempf., MD 08/25/2014

## 2014-08-19 ENCOUNTER — Telehealth: Payer: Self-pay | Admitting: Medical Oncology

## 2014-08-19 ENCOUNTER — Telehealth: Payer: Self-pay

## 2014-08-19 NOTE — Telephone Encounter (Signed)
Pt called asking when to collect the 3 day stool cards. Read Curio's note and called pt back. I suggested today, tomorrow, and Thursday. He is coming in on Thursday to see Dr Sondra Come. Otherwise to bring them in as soon as he can collect the 3 days. Also clarified appt at [facility] tomorrow for blood.

## 2014-08-19 NOTE — Telephone Encounter (Signed)
Rod Holler OT, is requesting to extend OT to two more visits to help pt with energy conservation and performing  ADLS. Dr Sherren Mocha declined orders. Note to [place].

## 2014-08-20 ENCOUNTER — Ambulatory Visit (HOSPITAL_COMMUNITY)
Admission: RE | Admit: 2014-08-20 | Discharge: 2014-08-20 | Disposition: A | Discharge status: Home / Self Care | Payer: Medicare Other | Source: Ambulatory Visit | Attending: Internal Medicine | Admitting: Internal Medicine

## 2014-08-20 VITALS — BP 129/76 | HR 88 | Temp 98.2°F | Resp 18

## 2014-08-20 DIAGNOSIS — C3491 Malignant neoplasm of unspecified part of right bronchus or lung: Secondary | ICD-10-CM | POA: Diagnosis not present

## 2014-08-20 DIAGNOSIS — D6481 Anemia due to antineoplastic chemotherapy: Secondary | ICD-10-CM

## 2014-08-20 DIAGNOSIS — T451X5A Adverse effect of antineoplastic and immunosuppressive drugs, initial encounter: Principal | ICD-10-CM

## 2014-08-20 LAB — PREPARE RBC (CROSSMATCH)

## 2014-08-20 MED ORDER — ACETAMINOPHEN 325 MG PO TABS
650.0000 mg | ORAL_TABLET | Freq: Once | ORAL | Status: DC
  Filled 2014-08-20: qty 2

## 2014-08-20 MED ORDER — HEPARIN SOD (PORK) LOCK FLUSH 100 UNIT/ML IV SOLN: 500.0000 [IU] | Freq: Every day | INTRAVENOUS | Status: DC | PRN

## 2014-08-20 MED ORDER — DIPHENHYDRAMINE HCL 25 MG PO CAPS
25.0000 mg | ORAL_CAPSULE | Freq: Once | ORAL | Status: AC
  Administered 2014-08-20: 25 mg via ORAL
  Filled 2014-08-20: qty 1

## 2014-08-20 MED ORDER — SODIUM CHLORIDE 0.9 % IV SOLN: 250.0000 mL | Freq: Once | INTRAVENOUS | Status: DC

## 2014-08-20 MED ORDER — SODIUM CHLORIDE 0.9 % IJ SOLN: 10.0000 mL | INTRAMUSCULAR | Status: DC | PRN

## 2014-08-20 NOTE — Procedures (Signed)
[facility]  Procedure Note  CHAYSEN TILLMAN HTX:[id number] DOB: 1935/11/28 DOA: 08/20/2014   PCP: [name], MD   Associated Diagnosis: Anemia due to antineoplastic chemotherapy   Procedure Note: Transfusion of 2 units PRBCs   Condition During Procedure:  Pt tolerated well   Condition at Discharge:  Pt accompanied by family; IV removed; no complications noted   Nigel Sloop, [facility]

## 2014-08-21 ENCOUNTER — Other Ambulatory Visit: Payer: Self-pay | Admitting: *Deleted

## 2014-08-21 ENCOUNTER — Ambulatory Visit (HOSPITAL_COMMUNITY)
Admission: RE | Admit: 2014-08-21 | Discharge: 2014-08-21 | Disposition: A | Discharge status: Home / Self Care | Payer: Medicare Other | Source: Ambulatory Visit | Attending: Radiation Oncology | Admitting: Radiation Oncology

## 2014-08-21 ENCOUNTER — Telehealth: Payer: Self-pay | Admitting: Oncology

## 2014-08-21 ENCOUNTER — Encounter: Payer: Self-pay | Admitting: Radiation Oncology

## 2014-08-21 VITALS — BP 117/66 | HR 97 | Temp 98.1°F | Resp 20 | Ht 71.0 in | Wt 188.4 lb

## 2014-08-21 DIAGNOSIS — C3411 Malignant neoplasm of upper lobe, right bronchus or lung: Secondary | ICD-10-CM

## 2014-08-21 DIAGNOSIS — M19019 Primary osteoarthritis, unspecified shoulder: Secondary | ICD-10-CM | POA: Diagnosis not present

## 2014-08-21 DIAGNOSIS — F22 Delusional disorders: Secondary | ICD-10-CM

## 2014-08-21 DIAGNOSIS — C3491 Malignant neoplasm of unspecified part of right bronchus or lung: Secondary | ICD-10-CM

## 2014-08-21 DIAGNOSIS — D630 Anemia in neoplastic disease: Secondary | ICD-10-CM

## 2014-08-21 LAB — TYPE AND SCREEN
ABO/RH(D): O POS
Antibody Screen: NEGATIVE
UNIT DIVISION: 0
Unit division: 0

## 2014-08-21 LAB — FECAL OCCULT BLOOD, GUAIAC: Occult Blood: POSITIVE

## 2014-08-21 NOTE — Progress Notes (Signed)
Follow up s/p rad tx lung 06/23/13-/29/16, had 2 units PRBC's yesterday 08/20/14, in w/c, sob 96% room air, c/o right shoulder pain 5/6 on 10 scale, last painmed taken last night, vitals wnl, complted diflucan, tongue still yellow some, poor appetite, ate 1.2 bagel and yogurt this am, , weak fatigued, no c/o nausea, next Keytruda infusion 09/08/14 and f/u appt  Awilda Metro, NP 8:46 AM  8:46 AM

## 2014-08-21 NOTE — Progress Notes (Signed)
Radiation Oncology         [phone] ________________________________  Name: Nicholas Schmitt MRN: [id number]  Date: 08/21/2014  DOB: 1935-10-22  Follow-Up Visit Note  CC: [name], MD  Raynelle Bring, MD    ICD-9-CM ICD-10-CM   1. Non-small cell carcinoma of lung, stage 4, right 162.9 C34.91   2. 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  Patient returns today for swallowing difficulties. On evaluation feb. 25th patient was having significant pain with swallowing and found to have a Candida infection and was placed on Diflucan for this issue. Patient has noticed significant improvement in this issue. He is able to swallow solid foods. Patient continues on systemic chemotherapy. The patient continues to complain of pain along the right scapula area. Does interfere with him sleeping at night.  the patient is intolerant of narcotics.                             ALLERGIES:  has No Known Allergies.  Meds: Current Outpatient Prescriptions  Medication Sig Dispense Refill  . acetaminophen (TYLENOL) 500 MG tablet Take 500 mg by mouth every 6 (six) hours as needed. 2 tabs    . albuterol (PROVENTIL,VENTOLIN) 90 MCG/ACT inhaler Inhale 2 puffs into the lungs every 6 (six) hours as needed for wheezing or shortness of breath.    . budesonide-formoterol (SYMBICORT) 160-4.5 MCG/ACT inhaler Inhale 2 puffs into the lungs 2 (two) times daily.    . Multiple Vitamin (MULTIVITAMIN) tablet Take 1 tablet by mouth daily.     . naproxen sodium (ANAPROX) 220 MG tablet Take 440 mg by mouth 2 (two) times daily as needed (pain).     . prochlorperazine (COMPAZINE) 10 MG tablet Take 1 tablet (10 mg total) by mouth every 6 (six) hours as needed for nausea or vomiting. 30 tablet 0  . sucralfate (CARAFATE) 1 G tablet  Take 1 g by mouth 4 (four) times daily -  with meals and at bedtime. Dissolve tablets in 15-30 ml of water before taking.  Take half hour before meals and at bedtime.  Disp. 42 tablets. 0 refills. Called in to [facility] per Dr. Lisbeth Renshaw.    . ALPRAZolam (XANAX) 0.25 MG tablet Take 1 tablet (0.25 mg total) by mouth at bedtime as needed for anxiety. (Patient not taking: Reported on 08/21/2014) 30 tablet 0  . clopidogrel (PLAVIX) 75 MG tablet Take 75 mg by mouth.  2  . hyaluronate sodium (RADIAPLEXRX) GEL Apply 1 application topically once.    . nitroGLYCERIN (NITROSTAT) 0.4 MG SL tablet Place 0.4 mg under the tongue every 5 (five) minutes as needed for chest pain (MAX 3 TABLETS).    [name] oxyCODONE (OXY IR/ROXICODONE) 5 MG immediate release tablet Take 1-2 tablets (5-10 mg total) by mouth every 4 (four) hours as needed for moderate pain. (Patient not taking: Reported on 08/21/2014) 30 tablet 0   No current facility-administered medications for this encounter.   Facility-Administered Medications Ordered in Other Encounters  Medication Dose Route Frequency Provider Last Rate Last Dose  . heparin lock flush 100 unit/mL  500 Units Intracatheter Daily PRN Adrena E Johnson, PA-C      . sodium chloride 0.9 % injection 10 mL  10 mL Intracatheter PRN Carlton Adam, PA-C      . sodium chloride 0.9 % injection 3 mL  3 mL Intracatheter PRN Carlton Adam, PA-C        Physical Findings: The patient is in no acute distress. Patient is alert and oriented.  height is 5\' 11"  (1.803 m) and weight is 188 lb 6.4 oz (85.458 kg). His oral temperature is 98.1 F (36.7 C). His blood pressure is 117/66 and his pulse is 97. His respiration is 20 and oxygen saturation is 96%. .  The lungs are clear. The heart has a regular rhythm and rate. No palpable subclavicular or axillary adenopathy.  patient points to pain along the right upper scapula. Palpation in this area reveals some point tenderness.  Lab Findings: Lab Results   Component Value Date   WBC 4.6 08/18/2014   HGB 7.8* 08/18/2014   HCT 25.0* 08/18/2014   MCV 91.1 08/18/2014   PLT 216 08/18/2014    Radiographic Findings: Dg Chest 2 View  07/23/2014   CLINICAL DATA:  Subsequent encounter for shortness of breath getting worse for the past 8 months, severe today, personal history of pacemaker and myocardial infarction  EXAM: CHEST  2 VIEW  COMPARISON:  07/21/2014  FINDINGS: Heart size and vascular pattern are normal. Two lead cardiac pacer identified with generator over left thorax. Left lung is clear. There is infiltrate over the right medial lung from the level of the hilum to the diaphragm. Infiltrate extends laterally over the costophrenic angle on the right. No significant pleural effusion. When compared to study performed on 07/21/2014, infiltrate is mildly more extensive.  IMPRESSION: Increased slight mid to lower lung zone infiltrate concerning for pneumonia.   Electronically Signed   By: Skipper Cliche M.D.   On: 07/23/2014 12:12    Impression:  The patient is recovering from the effects of radiation.  Swallowing issues have resolved with Diflucan and Carafate.   Plan:  Patient will be sent up to radiology for a plain x-ray of his right scapula. If this shows metastasis to this area he will be set up for a short course of palliative radiation therapy.  ____________________________________ Blair Promise, MD

## 2014-08-21 NOTE — Telephone Encounter (Signed)
Called Nicholas Schmitt and let him know the good results on his scapula x ray.  He wants to know what he can do for the pain.

## 2014-08-22 ENCOUNTER — Telehealth: Payer: Self-pay | Admitting: Oncology

## 2014-08-22 ENCOUNTER — Other Ambulatory Visit: Payer: Self-pay | Admitting: Radiation Oncology

## 2014-08-22 DIAGNOSIS — C3491 Malignant neoplasm of unspecified part of right bronchus or lung: Secondary | ICD-10-CM

## 2014-08-22 MED ORDER — TRAMADOL HCL 50 MG PO TABS: 50.0000 mg | ORAL_TABLET | Freq: Four times a day (QID) | ORAL | Status: DC | PRN

## 2014-08-22 NOTE — Telephone Encounter (Signed)
[name] and asked if he has taken tramadol for pain before.  He said that he had not and would be willing to try it to help his pain.  [facility] [address] Also advised him to call Dr. Julien Nordmann if the pain continues to have his scans moved up.  Nicholas Schmitt verbalized agreement.

## 2014-08-22 NOTE — Telephone Encounter (Signed)
Called Nicholas Schmitt and let him know that the Tramadol prescription is ready for pick up in the Radiation nursing area.  He verbalized agreement.

## 2014-08-25 ENCOUNTER — Telehealth: Payer: Self-pay | Admitting: Medical Oncology

## 2014-08-25 NOTE — Telephone Encounter (Signed)
-----   Message from Curt Bears, MD sent at 08/20/2014  8:14 AM EST ----- OK ----- Message -----    From: Ardeen Garland, RN    Sent: 08/19/2014   4:08 PM      To: Curt Bears, MD  Can pt have two more OT visits??  Rod Holler OT wants to help pt with energy conservation and performing  ADLS. Dr Sherren Mocha declined orders.

## 2014-08-25 NOTE — Telephone Encounter (Signed)
Left this message on OT voice mail

## 2014-08-26 ENCOUNTER — Telehealth: Payer: Self-pay | Admitting: *Deleted

## 2014-08-26 NOTE — Telephone Encounter (Addendum)
I told pt that Dr Julien Nordmann said it is okay to get a cortisone injection. I also told him Dr Jerrel Ivory does not want to giv ehim any appetite stimulant now. He  will get his weight at next visit and reevaluate.

## 2014-08-26 NOTE — Telephone Encounter (Signed)
Nicholas Schmitt called to ask if it is okay for him to receive a cortisone injection in his shoulder tomorrow by an orthopedic physician.  He also is requesting something for appetite stimulation.  He has no appetite whatsoever.  Please advise patient.

## 2014-08-27 ENCOUNTER — Ambulatory Visit (HOSPITAL_BASED_OUTPATIENT_CLINIC_OR_DEPARTMENT_OTHER): Payer: Medicare Other | Admitting: Nurse Practitioner

## 2014-08-27 ENCOUNTER — Other Ambulatory Visit (HOSPITAL_BASED_OUTPATIENT_CLINIC_OR_DEPARTMENT_OTHER): Payer: Medicare Other

## 2014-08-27 ENCOUNTER — Telehealth: Payer: Self-pay | Admitting: *Deleted

## 2014-08-27 ENCOUNTER — Telehealth: Payer: Self-pay | Admitting: Nurse Practitioner

## 2014-08-27 VITALS — BP 132/70 | HR 89 | Temp 97.6°F | Resp 20 | Wt 187.7 lb

## 2014-08-27 DIAGNOSIS — D630 Anemia in neoplastic disease: Secondary | ICD-10-CM | POA: Diagnosis not present

## 2014-08-27 DIAGNOSIS — C3490 Malignant neoplasm of unspecified part of unspecified bronchus or lung: Secondary | ICD-10-CM

## 2014-08-27 DIAGNOSIS — R0600 Dyspnea, unspecified: Secondary | ICD-10-CM

## 2014-08-27 DIAGNOSIS — C3411 Malignant neoplasm of upper lobe, right bronchus or lung: Secondary | ICD-10-CM

## 2014-08-27 DIAGNOSIS — G893 Neoplasm related pain (acute) (chronic): Secondary | ICD-10-CM

## 2014-08-27 DIAGNOSIS — R05 Cough: Secondary | ICD-10-CM

## 2014-08-27 DIAGNOSIS — N189 Chronic kidney disease, unspecified: Secondary | ICD-10-CM

## 2014-08-27 LAB — CBC WITH DIFFERENTIAL/PLATELET
BASO%: 0.5 % (ref 0.0–2.0)
BASOS ABS: 0 10*3/uL (ref 0.0–0.1)
EOS%: 6.7 % (ref 0.0–7.0)
Eosinophils Absolute: 0.3 10*3/uL (ref 0.0–0.5)
HCT: 32.1 % — ABNORMAL LOW (ref 38.4–49.9)
HGB: 9.9 g/dL — ABNORMAL LOW (ref 13.0–17.1)
LYMPH%: 12.1 % — AB (ref 14.0–49.0)
MCH: 28.5 pg (ref 27.2–33.4)
MCHC: 30.8 g/dL — ABNORMAL LOW (ref 32.0–36.0)
MCV: 92.5 fL (ref 79.3–98.0)
MONO#: 0.5 10*3/uL (ref 0.1–0.9)
MONO%: 14 % (ref 0.0–14.0)
NEUT%: 66.7 % (ref 39.0–75.0)
NEUTROS ABS: 2.6 10*3/uL (ref 1.5–6.5)
PLATELETS: 135 10*3/uL — AB (ref 140–400)
RBC: 3.47 10*6/uL — AB (ref 4.20–5.82)
RDW: 19.6 % — ABNORMAL HIGH (ref 11.0–14.6)
WBC: 3.9 10*3/uL — ABNORMAL LOW (ref 4.0–10.3)
lymph#: 0.5 10*3/uL — ABNORMAL LOW (ref 0.9–3.3)

## 2014-08-27 LAB — HOLD TUBE, BLOOD BANK

## 2014-08-27 MED ORDER — AZITHROMYCIN 250 MG PO TABS
ORAL_TABLET | ORAL | Status: DC
Start: 2014-08-27 — End: 2014-09-08

## 2014-08-27 NOTE — Telephone Encounter (Signed)
Nicholas Schmitt called to say he is having difficulty breathing and needs help.  He is using his O2 at 2L/min via nasal cannula. Will come in to [facility] at 9:45 am.

## 2014-08-27 NOTE — Telephone Encounter (Signed)
Opened in error

## 2014-08-27 NOTE — Telephone Encounter (Signed)
per pof to sch pt appt to [facility]  [facility]in[facility]-pt aware

## 2014-08-29 ENCOUNTER — Telehealth: Payer: Self-pay | Admitting: *Deleted

## 2014-08-29 NOTE — Telephone Encounter (Signed)
Called Nicholas Schmitt back.  He is breathing much better.  Encouraged him to take the Xanax each night at bedtime as directed.

## 2014-08-29 NOTE — Telephone Encounter (Signed)
Nicholas Schmitt called, sounding a little panicky, saying he was short of breath all night and was up 10 or 15 times during the night.  He was seen here on 08/27/14.  He said he is taking his Z-pack as directed and using his oxygen appropriately.  Asked if he took his Xanax at bedtime last night, and he said that he did not think he did.  Asked him to take a Xanax now to see if it might help his feeling of being short of breath.  He agreed to do so.  Plan to check on him in an hour or so.

## 2014-08-31 ENCOUNTER — Encounter: Payer: Self-pay | Admitting: Nurse Practitioner

## 2014-08-31 NOTE — Progress Notes (Addendum)
SYMPTOM MANAGEMENT CLINIC   HPI: Nicholas Schmitt 79 y.o. male diagnosed with lung cancer.  Patient is status post 6 cycles of carboplatin/Alimta chemotherapy regimen.  Currently undergoing Keytruda immunotherapy.  Patient called the cancer Center this morning requesting urgent care visit.  Patient is complaining of increased dyspnea and productive cough for the past 24 hours.  He is also complaining of a mild increase in fatigue as well.  He denies any recent fevers or chills.  Patient also suffers with chronic right shoulder pain; and states that he had planned to go to his orthopedist this morning for a cortisone injection.  However, patient canceled his orthopedic appointment to come to the [facility].  Patient states he has pain medication home to take for his shoulder pain; and does not need a refill.   HPI  ROS  Past Medical History  Diagnosis Date  . Hypertension   . CAD (coronary artery disease)     positive stress test in 2006 led to left heart cath (8/06) showing 95% prox RCA, 90% CFX, and 90% mLAD.  patient had a cypher DES to all 3 lesions  . Hyperlipidemia   . Hearing loss   . AAA (abdominal aortic aneurysm) 11/09    3.2 cm   . Chronic renal insufficiency   . History of radiation therapy 03/08/04- 04/08/04    prostate 4600 cGy in 3 fractions, radioactive seed implant 05/04/04  . Myocardial infarction 1995  . Pacemaker 2014  . Dysrhythmia   . Shortness of breath     exertion  . Asthma     bronchial  . COPD (chronic obstructive pulmonary disease)   . Cancer 2015    prostate   . Prostate cancer 2005    gleason 7  . Skin cancer     ear  . Lung cancer   . Lung cancer   . Radiation Jan.11-Jan 29/2016    Right central chest 35 gray in 14 fractions  . Radiation Jan.2016    35 gray in 14 fx lower lumbar/upper sacrum    Past Surgical History  Procedure Laterality Date  . Ptca  2006  . Coronary stent placement    . Permanent pacemaker insertion Bilateral  02/26/13    MDT Adapta L implanted by Dr Rayann Heman for sick sinus syndrome  . Radioactive seed implant  05/04/2004    8000 cGy, Dr Danny Lawless Dr Reece Agar  . Elbow surgery Bilateral 1973  . Insert / replace / remove pacemaker    . Coronary angioplasty    . Mediastinoscopy N/A 01/28/2014    Procedure: MEDIASTINOSCOPY;  Surgeon: Melrose Nakayama, MD;  Location: [facility];  Service: Thoracic;  Laterality: N/A;  . 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    has No Known Allergies.    Medication List  This list is accurate as of: 08/27/14 11:59 PM.  Always use your most recent med list.               acetaminophen 500 MG tablet  Commonly known as:  TYLENOL  Take 500 mg by mouth every 6 (six) hours as needed. 2 tabs     albuterol 90 MCG/ACT inhaler  Commonly known as:  PROVENTIL,VENTOLIN  Inhale 2 puffs into the lungs every 6 (six) hours as needed for wheezing or shortness of breath.     ALPRAZolam 0.25 MG tablet  Commonly known as:  XANAX  Take 1 tablet (0.25 mg total) by mouth at bedtime as needed for anxiety.     azithromycin 250 MG tablet  Commonly known as:   ZITHROMAX Z-PAK  Take 2 tabs (500 mg) PO on day # 1; then take 1 tab (250 mg) PO QD till gone.     budesonide-formoterol 160-4.5 MCG/ACT inhaler  Commonly known as:  SYMBICORT  Inhale 2 puffs into the lungs 2 (two) times daily.     clopidogrel 75 MG tablet  Commonly known as:  PLAVIX  Take 75 mg by mouth.     hyaluronate sodium Gel  Apply 1 application topically once.     multivitamin tablet  Take 1 tablet by mouth daily.     naproxen sodium 220 MG tablet  Commonly known as:  ANAPROX  Take 440 mg by mouth 2 (two) times daily as needed (pain).     nitroGLYCERIN 0.4 MG SL tablet  Commonly known as:  NITROSTAT  Place 0.4 mg under the tongue every 5 (five) minutes as needed for chest pain (MAX 3 TABLETS).     oxyCODONE 5 MG immediate release tablet  Commonly known as:  Oxy IR/ROXICODONE  Take 1-2 tablets (5-10 mg total) by mouth every 4 (four) hours as needed for moderate pain.     prochlorperazine 10 MG tablet  Commonly known as:  COMPAZINE  Take 1 tablet (10 mg total) by mouth every 6 (six) hours as needed for nausea or vomiting.     sucralfate 1 G tablet  Commonly known as:  CARAFATE  Take 1 g by mouth 4 (four) times daily -  with meals and at bedtime. Dissolve tablets in 15-30 ml of water before taking.  Take half hour before meals and at bedtime.  Disp. 42 tablets. 0 refills. [facility] in to [facility] per Dr. Lisbeth Renshaw.     traMADol 50 MG tablet  Commonly known as:  ULTRAM  Take 1 tablet (50 mg total) by mouth every 6 (six) hours as needed for moderate pain.         PHYSICAL EXAMINATION  Oncology Vitals 08/27/2014 08/21/2014 08/20/2014 08/20/2014 08/20/2014 08/20/2014 08/20/2014  Height - 180 cm - - - - -  Weight 85.14 kg 85.458 kg - - - - -  Weight (lbs) 187 lbs 11 oz 188 lbs 6 oz - - - - -  BMI (kg/m2) - 26.28 kg/m2 - - - - -  Temp 97.6 98.1 98.2 98.6 98.6 98.1 97.6  Pulse 89 97 88 82 83 85 97  Resp 20 20 18 18 18 18 18   SpO2 - 96 97 97 97 95 96  BSA (m2) - 2.07 m2 - - - -  -   BP Readings from Last 3 Encounters:  08/27/14 132/70  08/20/14 129/76  08/18/14 101/66    Physical Exam  Constitutional: He is oriented to person, place, and time and well-developed,  well-nourished, and in no distress.  HENT:  Head: Normocephalic and atraumatic.  Mouth/Throat: Oropharynx is clear and moist.  Eyes: Conjunctivae are normal. Pupils are equal, round, and reactive to light. Right eye exhibits no discharge. Left eye exhibits no discharge. No scleral icterus.  Neck: Normal range of motion. Neck supple. No JVD present. No tracheal deviation present. No thyromegaly present.  Cardiovascular: Normal rate, regular rhythm, normal heart sounds and intact distal pulses.   Pulmonary/Chest: Effort normal and breath sounds normal. No respiratory distress. He has no wheezes. He has no rales. He exhibits no tenderness.  Patient noted to have a productive cough occasionally during exam.  Patient with no shortness of breath on exam.  Abdominal: Soft. Bowel sounds are normal. He exhibits no distension and no mass. There is no tenderness. There is no rebound and no guarding.  Musculoskeletal: Normal range of motion. He exhibits no edema or tenderness.  Lymphadenopathy:    He has no cervical adenopathy.  Neurological: He is alert and oriented to person, place, and time.  Skin: Skin is warm and dry. No rash noted. No erythema. There is pallor.  Psychiatric: Affect normal.  Nursing note and vitals reviewed.   LABORATORY DATA:. Appointment on 08/27/2014  Component Date Value Ref Range Status  . WBC 08/27/2014 3.9* 4.0 - 10.3 10e3/uL Final  . NEUT# 08/27/2014 2.6  1.5 - 6.5 10e3/uL Final  . HGB 08/27/2014 9.9* 13.0 - 17.1 g/dL Final  . HCT 08/27/2014 32.1* 38.4 - 49.9 % Final  . Platelets 08/27/2014 135* 140 - 400 10e3/uL Final  . MCV 08/27/2014 92.5  79.3 - 98.0 fL Final  . MCH 08/27/2014 28.5  27.2 - 33.4 pg Final  . MCHC 08/27/2014 30.8* 32.0 - 36.0 g/dL Final  . RBC 08/27/2014  3.47* 4.20 - 5.82 10e6/uL Final  . RDW 08/27/2014 19.6* 11.0 - 14.6 % Final  . lymph# 08/27/2014 0.5* 0.9 - 3.3 10e3/uL Final  . MONO# 08/27/2014 0.5  0.1 - 0.9 10e3/uL Final  . Eosinophils Absolute 08/27/2014 0.3  0.0 - 0.5 10e3/uL Final  . Basophils Absolute 08/27/2014 0.0  0.0 - 0.1 10e3/uL Final  . NEUT% 08/27/2014 66.7  39.0 - 75.0 % Final  . LYMPH% 08/27/2014 12.1* 14.0 - 49.0 % Final  . MONO% 08/27/2014 14.0  0.0 - 14.0 % Final  . EOS% 08/27/2014 6.7  0.0 - 7.0 % Final  . BASO% 08/27/2014 0.5  0.0 - 2.0 % Final  . Hold Tube, Blood Bank 08/27/2014 Blood Bank Order Cancelled   Final     RADIOGRAPHIC STUDIES: No results found.  ASSESSMENT/PLAN:    Anemia in neoplastic disease Pt c/o increased SOB for last 24 hours. He is c/o increased productive cough; which is increasing pt's anxiety as well. Pt typically takes Xanax for anxiety- but forgot to take yesterday. Hemoglobin 9.9 today.  Exam-lungs essentially clear; the patient does have a productive cough.  Patient does not appear short of breath on exam today.  Will treat patient for URI/bronchitis symptoms; and prescribed Zithromax antibiotics.  Also, will arrange for patient to return to the sickle cell unit tomorrow morning for blood    Dyspnea Pt c/o increased SOB for last 24 hours. He is c/o increased productive cough; which is increasing pt's anxiety as well. Pt typically takes Xanax for anxiety- but forgot to take yesterday. Hemoglobin 9.9 today.  Exam-lungs essentially clear; the patient does have a productive cough.  Patient does not appear short of breath on exam today.  Will treat patient for URI/bronchitis symptoms; and prescribed Zithromax antibiotics.  Also, will arrange for patient to return to the sickle cell unit tomorrow morning for blood     Neoplasm related pain Patient complains of chronic right shoulder pain.  The patient has been to his orthopedist; and had originally had a cortisone injection scheduled  for earlier this morning.  Patient states that he rescheduled his cortisone injection appointment to come to the [facility] today.  He states he will reschedule his cortisone injection for a later date.  Patient states that he has a medication at home; he does not need a refill.   Non-small cell carcinoma of lung, stage 4 Patient received cycle 2 of his Keytruda immunotherapy on 08/18/2014.  He has plans to return on 08/31/2014 for his next cycle of immunotherapy.   Patient stated understanding of all instructions; and was in agreement with this plan of care. The patient knows to call the clinic with any problems, questions or concerns.   Review/collaboration with Dr. Julien Nordmann regarding all aspects of patient's visit today.   Total time spent with patient was 25 minutes;  with greater than 75 percent of that time spent in face to face counseling regarding patient's symptoms,  and coordination of care and follow up.  Disclaimer: This note was dictated with voice recognition software. Similar sounding words can inadvertently be transcribed and may not be corrected upon review.   Drue Second, NP 08/31/2014   ADDENDUM: Hematology/Oncology Attending: I had a face to face encounter with the patient. I recommended his care plan. This is a very pleasant 79 years old white male with a stage IV non-small cell lung cancer, adenocarcinoma status post induction chemotherapy with carboplatin and Alimta with disease progression. He is currently undergoing treatment with immunotherapy with Ketruda status post 2 cycles. The patient was recently treated for pneumonia after admission to the hospital with shortness of breath. He presented today with similar complaints and increasing shortness of breath over the last few days with increased productive cough. He denied having any significant fever or chills. We will treat the patient with a course of Zithromax. He would come back for follow-up visit on  09/08/2014 with the start of cycle #3 of his treatment with [place]. He was advised to call immediately if he has any concerning symptoms in the interval.  Disclaimer: This note was dictated with voice recognition software. Similar sounding words can inadvertently be transcribed and may be missed upon review. Eilleen Kempf., MD 09/01/2014

## 2014-08-31 NOTE — Assessment & Plan Note (Signed)
Patient complains of chronic right shoulder pain.  The patient has been to his orthopedist; and had originally had a cortisone injection scheduled for earlier this morning.  Patient states that he rescheduled his cortisone injection appointment to come to the [facility] today.  He states he will reschedule his cortisone injection for a later date.  Patient states that he has a medication at home; he does not need a refill.

## 2014-08-31 NOTE — Assessment & Plan Note (Addendum)
Pt c/o increased SOB for last 24 hours. He is c/o increased productive cough; which is increasing pt's anxiety as well. Pt typically takes Xanax for anxiety- but forgot to take yesterday. Hemoglobin 9.9 today.  Exam-lungs essentially clear; the patient does have a productive cough.  Patient does not appear short of breath on exam today.  Will treat patient for URI/bronchitis symptoms; and prescribed Zithromax antibiotics.  Also, will arrange for patient to return to the sickle cell unit tomorrow morning for blood

## 2014-08-31 NOTE — Assessment & Plan Note (Signed)
Patient received cycle 2 of his Keytruda immunotherapy on 08/18/2014.  He has plans to return on 08/31/2014 for his next cycle of immunotherapy.

## 2014-08-31 NOTE — Assessment & Plan Note (Signed)
Pt c/o increased SOB for last 24 hours. He is c/o increased productive cough; which is increasing pt's anxiety as well. Pt typically takes Xanax for anxiety- but forgot to take yesterday. Hemoglobin 9.9 today.  Exam-lungs essentially clear; the patient does have a productive cough.  Patient does not appear short of breath on exam today.  Will treat patient for URI/bronchitis symptoms; and prescribed Zithromax antibiotics.  Also, will arrange for patient to return to the sickle cell unit tomorrow morning for blood

## 2014-09-01 ENCOUNTER — Ambulatory Visit (INDEPENDENT_AMBULATORY_CARE_PROVIDER_SITE_OTHER): Payer: Medicare Other | Admitting: *Deleted

## 2014-09-01 ENCOUNTER — Encounter: Payer: Self-pay | Admitting: Internal Medicine

## 2014-09-01 ENCOUNTER — Telehealth: Payer: Self-pay | Admitting: Oncology

## 2014-09-01 DIAGNOSIS — I495 Sick sinus syndrome: Secondary | ICD-10-CM

## 2014-09-01 NOTE — Telephone Encounter (Signed)
Duff called and said he "needs something for his throat."  He reports having a "knot" in his throat with swallowing.  He said he does not have trouble swallowing solid foods or liquids.  He is not taking Carafate.  Advised him that Dr. Sondra Come will be consulted and we will call him back.

## 2014-09-01 NOTE — Progress Notes (Signed)
Remote pacemaker transmission.   

## 2014-09-01 NOTE — Addendum Note (Signed)
Addended by: Curt Bears on: 09/01/2014 06:12 PM   Modules accepted: Level of Service

## 2014-09-02 ENCOUNTER — Telehealth: Payer: Self-pay | Admitting: *Deleted

## 2014-09-02 ENCOUNTER — Telehealth: Payer: Self-pay | Admitting: Oncology

## 2014-09-02 NOTE — Telephone Encounter (Signed)
Spoke with patient. No further antibiotics at this time per Dr Julien Nordmann. Encouraged patient to drink plenty of fluids. To call us if any problems

## 2014-09-02 NOTE — Telephone Encounter (Signed)
Called Nicholas Schmitt to see if Carafate helped with the lump in his throat.  He reported that he is occasionally taking it and it does help for a short time.  He said he has a few tablets left and needs a refill.  Advised him that a refill will be called in to [place].  Nicholas Schmitt also said he is taking tramadol and it is helping him sleep at night.  He does report that the lump in his throat is getting better.  Advised him to call in the next few days if he would like to see Dr. Sondra Come next week.  [name] verbalized agreement.  [facility] Come for sucralfate (CARAFATE) 1 G tablet.  Take 1 g by mouth 4 (four) times daily - with meals and at bedtime. Dissolve tablets in 15-30 ml of water before taking. Disp. 42 tablets. 0 refills.

## 2014-09-02 NOTE — Telephone Encounter (Signed)
Spoke with patient to follow up on office visit with Selena Lesser on 08/27/14. Pt states he has completed the zpak, but cough is about the same and he is still producing a lot of phlegm. Is wondering what he can do about this. Will discuss with Cyndee and call him back

## 2014-09-03 LAB — MDC_IDC_ENUM_SESS_TYPE_REMOTE
Battery Remaining Longevity: 153 mo
Battery Voltage: 2.8 V
Brady Statistic AP VP Percent: 1 %
Date Time Interrogation Session: 20160321112218
Lead Channel Impedance Value: 558 Ohm
Lead Channel Pacing Threshold Amplitude: 0.875 V
Lead Channel Pacing Threshold Pulse Width: 0.4 ms
Lead Channel Sensing Intrinsic Amplitude: 1.4 mV
Lead Channel Sensing Intrinsic Amplitude: 11.2 mV
Lead Channel Setting Pacing Amplitude: 2 V
Lead Channel Setting Pacing Amplitude: 2.5 V
Lead Channel Setting Sensing Sensitivity: 4 mV
MDC IDC MSMT BATTERY IMPEDANCE: 111 Ohm
MDC IDC MSMT LEADCHNL RA PACING THRESHOLD PULSEWIDTH: 0.4 ms
MDC IDC MSMT LEADCHNL RV IMPEDANCE VALUE: 631 Ohm
MDC IDC MSMT LEADCHNL RV PACING THRESHOLD AMPLITUDE: 0.625 V
MDC IDC SET LEADCHNL RV PACING PULSEWIDTH: 0.4 ms
MDC IDC STAT BRADY AP VS PERCENT: 27 %
MDC IDC STAT BRADY AS VP PERCENT: 1 %
MDC IDC STAT BRADY AS VS PERCENT: 71 %

## 2014-09-08 ENCOUNTER — Encounter: Payer: Self-pay | Admitting: Physician Assistant

## 2014-09-08 ENCOUNTER — Ambulatory Visit: Payer: Medicare Other | Admitting: Nutrition

## 2014-09-08 ENCOUNTER — Ambulatory Visit (HOSPITAL_BASED_OUTPATIENT_CLINIC_OR_DEPARTMENT_OTHER): Payer: Medicare Other | Admitting: Physician Assistant

## 2014-09-08 ENCOUNTER — Ambulatory Visit (HOSPITAL_BASED_OUTPATIENT_CLINIC_OR_DEPARTMENT_OTHER): Payer: Medicare Other

## 2014-09-08 ENCOUNTER — Other Ambulatory Visit (HOSPITAL_BASED_OUTPATIENT_CLINIC_OR_DEPARTMENT_OTHER): Payer: Medicare Other

## 2014-09-08 ENCOUNTER — Telehealth: Payer: Self-pay | Admitting: Medical Oncology

## 2014-09-08 ENCOUNTER — Other Ambulatory Visit: Payer: Self-pay | Admitting: Medical Oncology

## 2014-09-08 ENCOUNTER — Telehealth: Payer: Self-pay | Admitting: *Deleted

## 2014-09-08 VITALS — BP 113/69 | HR 84 | Temp 98.5°F | Resp 18 | Ht 71.0 in | Wt 191.6 lb

## 2014-09-08 DIAGNOSIS — R0602 Shortness of breath: Secondary | ICD-10-CM

## 2014-09-08 DIAGNOSIS — C771 Secondary and unspecified malignant neoplasm of intrathoracic lymph nodes: Secondary | ICD-10-CM

## 2014-09-08 DIAGNOSIS — R079 Chest pain, unspecified: Secondary | ICD-10-CM | POA: Diagnosis not present

## 2014-09-08 DIAGNOSIS — C3491 Malignant neoplasm of unspecified part of right bronchus or lung: Secondary | ICD-10-CM | POA: Diagnosis not present

## 2014-09-08 DIAGNOSIS — Z5112 Encounter for antineoplastic immunotherapy: Secondary | ICD-10-CM | POA: Diagnosis not present

## 2014-09-08 DIAGNOSIS — C3411 Malignant neoplasm of upper lobe, right bronchus or lung: Secondary | ICD-10-CM | POA: Diagnosis not present

## 2014-09-08 LAB — CBC WITH DIFFERENTIAL/PLATELET
BASO%: 0.5 % (ref 0.0–2.0)
BASOS ABS: 0 10*3/uL (ref 0.0–0.1)
EOS%: 10.8 % — AB (ref 0.0–7.0)
Eosinophils Absolute: 0.4 10*3/uL (ref 0.0–0.5)
HEMATOCRIT: 31.9 % — AB (ref 38.4–49.9)
HEMOGLOBIN: 9.9 g/dL — AB (ref 13.0–17.1)
LYMPH%: 11.3 % — ABNORMAL LOW (ref 14.0–49.0)
MCH: 29 pg (ref 27.2–33.4)
MCHC: 31 g/dL — ABNORMAL LOW (ref 32.0–36.0)
MCV: 93.5 fL (ref 79.3–98.0)
MONO#: 0.5 10*3/uL (ref 0.1–0.9)
MONO%: 12.8 % (ref 0.0–14.0)
NEUT#: 2.5 10*3/uL (ref 1.5–6.5)
NEUT%: 64.6 % (ref 39.0–75.0)
Platelets: 126 10*3/uL — ABNORMAL LOW (ref 140–400)
RBC: 3.41 10*6/uL — ABNORMAL LOW (ref 4.20–5.82)
RDW: 19.7 % — AB (ref 11.0–14.6)
WBC: 3.9 10*3/uL — ABNORMAL LOW (ref 4.0–10.3)
lymph#: 0.4 10*3/uL — ABNORMAL LOW (ref 0.9–3.3)

## 2014-09-08 LAB — COMPREHENSIVE METABOLIC PANEL (CC13)
ALK PHOS: 102 U/L (ref 40–150)
ALT: 17 U/L (ref 0–55)
AST: 17 U/L (ref 5–34)
Albumin: 2.9 g/dL — ABNORMAL LOW (ref 3.5–5.0)
Anion Gap: 10 mEq/L (ref 3–11)
BUN: 10.8 mg/dL (ref 7.0–26.0)
CALCIUM: 9.4 mg/dL (ref 8.4–10.4)
CO2: 25 mEq/L (ref 22–29)
CREATININE: 0.7 mg/dL (ref 0.7–1.3)
Chloride: 106 mEq/L (ref 98–109)
EGFR: >90 mL/min/{1.73_m2} (ref >90)
GLUCOSE: 125 mg/dL (ref 70–140)
Potassium: 4.3 mEq/L (ref 3.5–5.1)
Sodium: 141 mEq/L (ref 136–145)
Total Bilirubin: 0.71 mg/dL (ref 0.20–1.20)
Total Protein: 6.3 g/dL — ABNORMAL LOW (ref 6.4–8.3)

## 2014-09-08 LAB — TSH CHCC: TSH: 2.714 m(IU)/L (ref 0.320–4.118)

## 2014-09-08 MED ORDER — TRAMADOL HCL 50 MG PO TABS: 50.0000 mg | ORAL_TABLET | Freq: Four times a day (QID) | ORAL | Status: DC | PRN

## 2014-09-08 MED ORDER — SODIUM CHLORIDE 0.9 % IV SOLN: 2.0000 mg/kg | Freq: Once | INTRAVENOUS | Status: DC

## 2014-09-08 MED ORDER — SODIUM CHLORIDE 0.9 % IV SOLN
2.0000 mg/kg | Freq: Once | INTRAVENOUS | Status: AC
  Administered 2014-09-08: 175 mg via INTRAVENOUS
  Filled 2014-09-08: qty 7

## 2014-09-08 MED ORDER — PROCHLORPERAZINE MALEATE 10 MG PO TABS
ORAL_TABLET | ORAL | Status: AC
  Filled 2014-09-08: qty 1

## 2014-09-08 MED ORDER — PROCHLORPERAZINE MALEATE 10 MG PO TABS
10.0000 mg | ORAL_TABLET | Freq: Once | ORAL | Status: AC
  Administered 2014-09-08: 10 mg via ORAL

## 2014-09-08 MED ORDER — ALPRAZOLAM 0.25 MG PO TABS: 0.2500 mg | ORAL_TABLET | Freq: Every evening | ORAL | Status: DC | PRN

## 2014-09-08 MED ORDER — SODIUM CHLORIDE 0.9 % IV SOLN
Freq: Once | INTRAVENOUS | Status: AC
  Administered 2014-09-08: 12:00:00 via INTRAVENOUS

## 2014-09-08 NOTE — Patient Instructions (Signed)
[facility] Discharge Instructions for Patients Receiving Chemotherapy  Today you received the following chemotherapy agents: Keytruda.  To help prevent nausea and vomiting after your treatment, we encourage you to take your nausea medication: Compazine 10 mg every 6 hours as needed.   If you develop nausea and vomiting that is not controlled by your nausea medication, call the clinic.   BELOW ARE SYMPTOMS THAT SHOULD BE REPORTED IMMEDIATELY:  *FEVER GREATER THAN 100.5 F  *CHILLS WITH OR WITHOUT FEVER  NAUSEA AND VOMITING THAT IS NOT CONTROLLED WITH YOUR NAUSEA MEDICATION  *UNUSUAL SHORTNESS OF BREATH  *UNUSUAL BRUISING OR BLEEDING  TENDERNESS IN MOUTH AND THROAT WITH OR WITHOUT PRESENCE OF ULCERS  *URINARY PROBLEMS  *BOWEL PROBLEMS  UNUSUAL RASH Items with * indicate a potential emergency and should be followed up as soon as possible.  Feel free to call the clinic you have any questions or concerns. The clinic phone number is [phone].  Please show the Center at check-in to the Emergency Department and triage nurse.

## 2014-09-08 NOTE — Progress Notes (Addendum)
[facility] [phone]   [phone]  OFFICE PROGRESS NOTE  [name],[name] [name], MD [place] [address]  DIAGNOSIS: Non-small cell carcinoma of lung, stage 4  Primary site: Lung (Right)  Staging method: AJCC 7th Edition  Clinical: Stage IV (T1a, N3, M1b) signed by Curt Bears, MD on 02/01/2014 1:42 PM  Summary: Stage IV (T1a, N3, M1b) Prostate cancer  Primary site: Prostate  Clinical: Stage I (T1c, NX, M0) signed by Wyatt Portela, MD on 08/06/2013 1:59 PM  Summary: Stage I (T1c, NX, M0)  PRIOR THERAPY: Systemic chemotherapy with carboplatin for an AUC of 5 and Alimta 500 mg per meter squared given every 3 weeks. Status post 6 cycles, last dose was given 05/20/2014 discontinued secondary to disease progression.  CURRENT THERAPY: Immunotherapy with Keytruda (pembrolizumab) 2 MG/KG every 3 weeks. First dose 07/14/2014. He has positive PDL 1 expression (90%). Status post 2 cycles.  INTERVAL HISTORY: Nicholas Schmitt 79 y.o. male returns to the clinic today for follow-up visit accompanied by his wife. The patient completed palliative radiotherapy to the right upper chest lesion in addition to the lumbar spine under the care of Dr. Sondra Come. He is now being treated with immunotherapy with Keytruda. He complains of a cough that is productive of white to yellow secretions, not associated with fever or chills.  He denied having any  nausea or vomiting. He reports difficulty swallowing after completing radiation therapy. He also has some nasal stuffiness finds it hard to breathe night. He thinks overall his breathing is slightly worse although it varies and upon further questioning feels it is relatively stable. The patient denied having any significant chest pain,  cough or hemoptysis. He denied diarrhea, increased shortness of breath or skin rash. He did develop 2 random blisters on his right hand with no other skin lesions. He  presents for cycle #3.  MEDICAL HISTORY: Past Medical History  Diagnosis Date  . Hypertension   . CAD (coronary artery disease)     positive stress test in 2006 led to left heart cath (8/06) showing 95% prox RCA, 90% CFX, and 90% mLAD.  patient had a cypher DES to all 3 lesions  . Hyperlipidemia   . Hearing loss   . AAA (abdominal aortic aneurysm) 11/09    3.2 cm   . Chronic renal insufficiency   . History of radiation therapy 03/08/04- 04/08/04    prostate 4600 cGy in 3 fractions, radioactive seed implant 05/04/04  . Myocardial infarction 1995  . Pacemaker 2014  . Dysrhythmia   . Shortness of breath     exertion  . Asthma     bronchial  . COPD (chronic obstructive pulmonary disease)   . Cancer 2015    prostate   . Prostate cancer 2005    gleason 7  . Skin cancer     ear  . Lung cancer   . Lung cancer   . Radiation Jan.11-Jan 29/2016    Right central chest 35 gray in 14 fractions  . Radiation Jan.2016    35 gray in 14 fx lower lumbar/upper sacrum    ALLERGIES:  has No Known Allergies.  MEDICATIONS:  Current Outpatient Prescriptions  Medication Sig Dispense Refill  . acetaminophen (TYLENOL) 500 MG tablet Take 500 mg by mouth every 6 (six) hours as needed. 2 tabs    . albuterol (PROVENTIL,VENTOLIN) 90 MCG/ACT inhaler Inhale 2 puffs into the lungs every 6 (six) hours as needed  for wheezing or shortness of breath.    . ALPRAZolam (XANAX) 0.25 MG tablet Take 1 tablet (0.25 mg total) by mouth at bedtime as needed for anxiety. 30 tablet 0  . budesonide-formoterol (SYMBICORT) 160-4.5 MCG/ACT inhaler Inhale 2 puffs into the lungs 2 (two) times daily.    . hyaluronate sodium (RADIAPLEXRX) GEL Apply 1 application topically once.    . Multiple Vitamin (MULTIVITAMIN) tablet Take 1 tablet by mouth daily.     . naproxen sodium (ANAPROX) 220 MG tablet Take 440 mg by mouth 2 (two) times daily as needed (pain).     . sucralfate (CARAFATE) 1 G tablet Take 1 g by mouth 4 (four) times  daily -  with meals and at bedtime. Dissolve tablets in 15-30 ml of water before taking.  Disp. 42 tablets. 0 refills. Called in refill to [facility] per Dr. Sondra Come on 09/02/14.    [name] traMADol (ULTRAM) 50 MG tablet Take 1 tablet (50 mg total) by mouth every 6 (six) hours as needed for moderate pain. 30 tablet 0  . nitroGLYCERIN (NITROSTAT) 0.4 MG SL tablet Place 0.4 mg under the tongue every 5 (five) minutes as needed for chest pain (MAX 3 TABLETS).    [name] oxyCODONE (OXY IR/ROXICODONE) 5 MG immediate release tablet Take 1-2 tablets (5-10 mg total) by mouth every 4 (four) hours as needed for moderate pain. (Patient not taking: Reported on 09/08/2014) 30 tablet 0  . prochlorperazine (COMPAZINE) 10 MG tablet Take 1 tablet (10 mg total) by mouth every 6 (six) hours as needed for nausea or vomiting. (Patient not taking: Reported on 09/08/2014) 30 tablet 0   No current facility-administered medications for this visit.   Facility-Administered Medications Ordered in Other Visits  Medication Dose Route Frequency Provider Last Rate Last Dose  . heparin lock flush 100 unit/mL  500 Units Intracatheter Daily PRN Elinora Weigand E Stephaniemarie Stoffel, PA-C      . sodium chloride 0.9 % injection 10 mL  10 mL Intracatheter PRN Alyss Granato E Perlita Forbush, PA-C      . sodium chloride 0.9 % injection 3 mL  3 mL Intracatheter PRN Carlton Adam, PA-C        SURGICAL HISTORY:  Past Surgical History  Procedure Laterality Date  . Ptca  2006  . Coronary stent placement    . Permanent pacemaker insertion Bilateral 02/26/13    MDT Adapta L implanted by Dr Rayann Heman for sick sinus syndrome  . Radioactive seed implant  05/04/2004    8000 cGy, Dr Danny Lawless Dr Reece Agar  . Elbow surgery Bilateral 1973  . Insert / replace / remove pacemaker    . Coronary angioplasty    . Mediastinoscopy N/A 01/28/2014    Procedure: MEDIASTINOSCOPY;  Surgeon: Melrose Nakayama, MD;  Location: [facility];  Service: Thoracic;  Laterality: N/A;  . Permanent pacemaker  insertion N/A 02/26/2013    Procedure: PERMANENT PACEMAKER INSERTION;  Surgeon: [name], MD;  Location: [facility] CATH LAB;  Service: Cardiovascular;  Laterality: N/A;    REVIEW OF SYSTEMS:  Constitutional: positive for fatigue Eyes: negative Ears, nose, mouth, throat, and face: negative Respiratory: positive for dyspnea on exertion Cardiovascular: negative Gastrointestinal: negative Genitourinary:negative Integument/breast: negative Hematologic/lymphatic: negative Musculoskeletal:negative Neurological: negative Behavioral/Psych: negative Endocrine: negative Allergic/Immunologic: negative   PHYSICAL EXAMINATION: General appearance: alert, cooperative, fatigued and no distress Head: Normocephalic, without obvious abnormality, atraumatic Neck: no adenopathy, no carotid bruit, supple, symmetrical, trachea midline and thyroid not enlarged, symmetric, no tenderness/mass/nodules Lymph nodes: Cervical, supraclavicular, and axillary nodes normal.  Resp: clear to auscultation bilaterally Back: symmetric, no curvature. ROM normal. No CVA tenderness. Cardio: regular rate and rhythm, S1, S2 normal, no murmur, click, rub or gallop GI: soft, non-tender; bowel sounds normal; no masses,  no organomegaly Extremities: extremities normal, atraumatic, no cyanosis or edema Neurologic: Alert and oriented X 3, normal strength and tone. Normal symmetric reflexes. Normal coordination and gait Skin: right thumb and third DIP with partially denuded blisters, approximately , no fluid present  ECOG PERFORMANCE STATUS: 1 - Symptomatic but completely ambulatory  Blood pressure 113/69, pulse 84, temperature 98.5 F (36.9 C), temperature source Oral, resp. rate 18, height 5\' 11"  (1.803 m), weight 191 lb 9.6 oz (86.909 kg), SpO2 98 %.  LABORATORY DATA: Lab Results  Component Value Date   WBC 3.9* 09/08/2014   HGB 9.9* 09/08/2014   HCT 31.9* 09/08/2014   MCV 93.5 09/08/2014   PLT 126* 09/08/2014       Chemistry      Component Value Date/Time   NA 141 09/08/2014 1021   NA 139 07/24/2014 0436   K 4.3 09/08/2014 1021   K 4.1 07/24/2014 0436   CL 102 07/24/2014 0436   CO2 25 09/08/2014 1021   CO2 26 07/24/2014 0436   BUN 10.8 09/08/2014 1021   BUN 16 07/24/2014 0436   CREATININE 0.7 09/08/2014 1021   CREATININE 0.74 07/24/2014 0436      Component Value Date/Time   CALCIUM 9.4 09/08/2014 1021   CALCIUM 9.3 07/24/2014 0436   ALKPHOS 102 09/08/2014 1021   ALKPHOS 66 07/21/2014 1821   AST 17 09/08/2014 1021   AST 43* 07/21/2014 1821   ALT 17 09/08/2014 1021   ALT 37 07/21/2014 1821   BILITOT 0.71 09/08/2014 1021   BILITOT 1.2 07/21/2014 1821       RADIOGRAPHIC STUDIES: Dg Scapula Right  08/21/2014   CLINICAL DATA:  79 year old male  EXAM: RIGHT SCAPULA - 2+ VIEWS  COMPARISON:  Chest x-ray 07/23/2014, CT 07/14/2014  FINDINGS: No acute fracture identified.  No aggressive bony lesion identified.  Degenerative changes of the acromioclavicular joint. Mild changes of the glenohumeral joint. The glenohumeral joint appears congruent. No significant soft tissue swelling. No radiopaque foreign body.  Consolidative changes of the right hilum of the lung in this patient with known small cell carcinoma.  Pacing leads partially visualized.  Atherosclerosis.  IMPRESSION: No acute bony abnormality.  Degenerative changes of the acromioclavicular joint and glenohumeral joint.  Changes of the right lung/ hilum are again seen in this patient with known small cell carcinoma.  Signed,  Dulcy Fanny. Earleen Newport, DO  [facility]  [facility]   Electronically Signed   By: Corrie Mckusick D.O.   On: 08/21/2014 09:43    ASSESSMENT AND PLAN: this is a very pleasant 79 years old white male with stage IV non-small cell lung cancer, adenocarcinoma with positive PDL 1 expression, completed systemic chemotherapy with carboplatin and Alimta status post 6 cycles and tolerated his  treatment fairly well.  The recent CT scan of the chest, abdomen and pelvis showed evidence for disease progression with new spiculated left upper lobe nodule in addition to increased in the size of the right adrenal gland. The patient is being treated with Keytruda at 2 mg/kg given every 3 weeks, status post 2 cycles. Patient was discussed with and also seen by Dr. Julien Nordmann. He'll proceed with cycle #3 of Keytruda today as scheduled. He'll follow-up in 3 weeks prior to cycle #4. That time  we will consider scheduling a restaging CT scan to reevaluate his disease. He did have a CT angiogram that was negative for pulmonary embolism on 07/14/2014. He was treated with a seven-day course of Levaquin with resolution of his symptoms. Patient was advised to use a humidifier at bedtime to help with his nasal congestion.  The patient was advised to call immediately if he has any concerning symptoms in the interval. The patient voices understanding of current disease status and treatment options and is in agreement with the current care plan.  All questions were answered. The patient knows to call the clinic with any problems, questions or concerns. We can certainly see the patient much sooner if necessary.  Carlton Adam, PA-C 09/08/2014  ADDENDUM: Hematology/Oncology Attending: I had a face to face encounter with the patient. I recommended his care plan. This is a very pleasant 79 years old white male with metastatic non-small cell lung cancer is currently undergoing second line therapy with immunotherapy with Ketruda (pembrolizumab) status post 2 cycles. He is tolerating the treatment well but continues to have shortness of breath and occasional right-sided chest pain. He was treated recently for questionable pneumonia with a course of Levaquin with resolution of his symptoms. I recommended for the patient to proceed with cycle #3 today as a scheduled. He would come back for follow-up visit in 3 weeks for  reevaluation after repeating CT scan of the chest, abdomen and pelvis for restaging of his disease. He was advised to call immediately if he has any concerning symptoms in the interval.  Disclaimer: This note was dictated with voice recognition software. Similar sounding words can inadvertently be transcribed and may be missed upon review. Eilleen Kempf., MD 09/13/2014

## 2014-09-08 NOTE — Progress Notes (Signed)
Nutrition follow-up completed with patient in chemotherapy. Appetite and weight have increased.  Weight documented as 191 pounds March 28. Patient reports mouth remains sore. Patient continues to drink oral nutrition supplements. He has no questions.  Nutrition diagnosis: Food and nutrition related knowledge deficit improved.  Intervention: I enforced the importance of adequate calories and protein to minimize weight loss throughout treatment. Patient should continue to use shakes, smoothies or commercial supplements for adequate protein and calories when needed. Teach back method used.  Monitoring, evaluation, goals: Patient has been able to increase oral intake and has gained a few pounds.  Next visit: Monday, April 18, during chemotherapy.   **Disclaimer: This note was dictated with voice recognition software. Similar sounding words can inadvertently be transcribed and this note may contain transcription errors which may not have been corrected upon publication of note.**

## 2014-09-08 NOTE — Telephone Encounter (Signed)
Per staff message and POF I have scheduled appts. Advised scheduler of appts. JMW  

## 2014-09-08 NOTE — Telephone Encounter (Signed)
I returned pts call. I told him no PSA done.

## 2014-09-10 ENCOUNTER — Telehealth: Payer: Self-pay | Admitting: Oncology

## 2014-09-10 NOTE — Telephone Encounter (Signed)
Called Nicholas Schmitt to see how he is doing.  He reports he has a lump in his throat right around his adam's apple.  He reports it is a little worse and says he has to chew his food really well before he swallows or it feels like it gets stuck.  He is taking Carafate 3 times a day and says it is really not helping.  He has not been taking it at night because it drys out his throat.  He had his wife look at his tongue and it does not have a white coating.  He is wondering if he needs to go to an ENT.  He reports having a lot of mucus in his nose at night and is wondering what he can take for it.  Advised him that Dr. Sondra Come will be advised and we will call him back.

## 2014-09-11 ENCOUNTER — Telehealth: Payer: Self-pay | Admitting: Oncology

## 2014-09-11 ENCOUNTER — Telehealth: Payer: Self-pay | Admitting: *Deleted

## 2014-09-11 ENCOUNTER — Other Ambulatory Visit: Payer: Self-pay | Admitting: Oncology

## 2014-09-11 DIAGNOSIS — C3491 Malignant neoplasm of unspecified part of right bronchus or lung: Secondary | ICD-10-CM

## 2014-09-11 NOTE — Telephone Encounter (Signed)
CALLED PATIENT TO INFORM OF APPT. WITH DR. Melony Overly ON 09-12-14 - ARRIVAL TIME - 1:30 PM, SPOKE WITH PATIENT AND HE IS AWARE OF THIS APPT.

## 2014-09-11 NOTE — Telephone Encounter (Signed)
Called Nicholas Schmitt and let him know that Dr. Sondra Come would like him to see an Ear Nose and Throat doctor for the lump in his throat by his adam's apple when swallowing.  Nicholas Schmitt said he has seen Dr. Lucia Gaskins in the past.  Referral entered for Dr. Lucia Gaskins per Dr. Sondra Come.

## 2014-09-12 NOTE — Patient Instructions (Signed)
Continue labs as scheduled Follow up in 3 weeks

## 2014-09-18 ENCOUNTER — Encounter: Payer: Self-pay | Admitting: Cardiology

## 2014-09-26 ENCOUNTER — Encounter (HOSPITAL_COMMUNITY): Payer: Self-pay

## 2014-09-26 ENCOUNTER — Ambulatory Visit (HOSPITAL_COMMUNITY)
Admission: RE | Admit: 2014-09-26 | Discharge: 2014-09-26 | Disposition: A | Discharge status: Home / Self Care | Payer: Medicare Other | Source: Ambulatory Visit | Attending: Physician Assistant | Admitting: Physician Assistant

## 2014-09-26 DIAGNOSIS — C61 Malignant neoplasm of prostate: Secondary | ICD-10-CM | POA: Diagnosis not present

## 2014-09-26 DIAGNOSIS — Z87891 Personal history of nicotine dependence: Secondary | ICD-10-CM | POA: Insufficient documentation

## 2014-09-26 DIAGNOSIS — C7951 Secondary malignant neoplasm of bone: Secondary | ICD-10-CM | POA: Diagnosis not present

## 2014-09-26 DIAGNOSIS — C3491 Malignant neoplasm of unspecified part of right bronchus or lung: Secondary | ICD-10-CM | POA: Insufficient documentation

## 2014-09-26 MED ORDER — IOHEXOL 300 MG/ML  SOLN
100.0000 mL | Freq: Once | INTRAMUSCULAR | Status: AC | PRN
  Administered 2014-09-26: 100 mL via INTRAVENOUS

## 2014-09-29 ENCOUNTER — Ambulatory Visit: Payer: Medicare Other | Admitting: Nutrition

## 2014-09-29 ENCOUNTER — Telehealth: Payer: Self-pay | Admitting: Physician Assistant

## 2014-09-29 ENCOUNTER — Ambulatory Visit (HOSPITAL_BASED_OUTPATIENT_CLINIC_OR_DEPARTMENT_OTHER): Payer: Medicare Other

## 2014-09-29 ENCOUNTER — Encounter: Payer: Self-pay | Admitting: *Deleted

## 2014-09-29 ENCOUNTER — Ambulatory Visit: Payer: Medicare Other | Admitting: Physician Assistant

## 2014-09-29 ENCOUNTER — Encounter: Payer: Self-pay | Admitting: Physician Assistant

## 2014-09-29 ENCOUNTER — Ambulatory Visit (HOSPITAL_BASED_OUTPATIENT_CLINIC_OR_DEPARTMENT_OTHER): Payer: Medicare Other | Admitting: Physician Assistant

## 2014-09-29 ENCOUNTER — Other Ambulatory Visit: Payer: Medicare Other

## 2014-09-29 ENCOUNTER — Other Ambulatory Visit (HOSPITAL_BASED_OUTPATIENT_CLINIC_OR_DEPARTMENT_OTHER): Payer: Medicare Other

## 2014-09-29 VITALS — BP 130/77 | HR 84 | Temp 97.8°F | Resp 18 | Ht 71.0 in | Wt 185.7 lb

## 2014-09-29 DIAGNOSIS — C3411 Malignant neoplasm of upper lobe, right bronchus or lung: Secondary | ICD-10-CM

## 2014-09-29 DIAGNOSIS — C3491 Malignant neoplasm of unspecified part of right bronchus or lung: Secondary | ICD-10-CM

## 2014-09-29 DIAGNOSIS — Z5112 Encounter for antineoplastic immunotherapy: Secondary | ICD-10-CM | POA: Diagnosis not present

## 2014-09-29 DIAGNOSIS — C7971 Secondary malignant neoplasm of right adrenal gland: Secondary | ICD-10-CM

## 2014-09-29 DIAGNOSIS — C3490 Malignant neoplasm of unspecified part of unspecified bronchus or lung: Secondary | ICD-10-CM

## 2014-09-29 LAB — CBC WITH DIFFERENTIAL/PLATELET
BASO%: 0.8 % (ref 0.0–2.0)
BASOS ABS: 0 10*3/uL (ref 0.0–0.1)
EOS ABS: 0.4 10*3/uL (ref 0.0–0.5)
EOS%: 14.8 % — ABNORMAL HIGH (ref 0.0–7.0)
HEMATOCRIT: 33.7 % — AB (ref 38.4–49.9)
HGB: 10.9 g/dL — ABNORMAL LOW (ref 13.0–17.1)
LYMPH%: 18.3 % (ref 14.0–49.0)
MCH: 30 pg (ref 27.2–33.4)
MCHC: 32.2 g/dL (ref 32.0–36.0)
MCV: 93.1 fL (ref 79.3–98.0)
MONO#: 0.3 10*3/uL (ref 0.1–0.9)
MONO%: 12.2 % (ref 0.0–14.0)
NEUT#: 1.4 10*3/uL — ABNORMAL LOW (ref 1.5–6.5)
NEUT%: 53.9 % (ref 39.0–75.0)
Platelets: 137 10*3/uL — ABNORMAL LOW (ref 140–400)
RBC: 3.62 10*6/uL — ABNORMAL LOW (ref 4.20–5.82)
RDW: 20.4 % — ABNORMAL HIGH (ref 11.0–14.6)
WBC: 2.6 10*3/uL — ABNORMAL LOW (ref 4.0–10.3)
lymph#: 0.5 10*3/uL — ABNORMAL LOW (ref 0.9–3.3)

## 2014-09-29 LAB — COMPREHENSIVE METABOLIC PANEL (CC13)
ALK PHOS: 107 U/L (ref 40–150)
ALT: 14 U/L (ref 0–55)
ANION GAP: 10 meq/L (ref 3–11)
AST: 17 U/L (ref 5–34)
Albumin: 3.3 g/dL — ABNORMAL LOW (ref 3.5–5.0)
BILIRUBIN TOTAL: 0.74 mg/dL (ref 0.20–1.20)
BUN: 10.4 mg/dL (ref 7.0–26.0)
CALCIUM: 9.2 mg/dL (ref 8.4–10.4)
CO2: 24 mEq/L (ref 22–29)
Chloride: 108 mEq/L (ref 98–109)
Creatinine: 0.7 mg/dL (ref 0.7–1.3)
GLUCOSE: 102 mg/dL (ref 70–140)
Potassium: 4.4 mEq/L (ref 3.5–5.1)
SODIUM: 141 meq/L (ref 136–145)
TOTAL PROTEIN: 6.3 g/dL — AB (ref 6.4–8.3)

## 2014-09-29 LAB — TSH CHCC: TSH: 3.184 m(IU)/L (ref 0.320–4.118)

## 2014-09-29 MED ORDER — PROCHLORPERAZINE MALEATE 10 MG PO TABS
ORAL_TABLET | ORAL | Status: AC
  Filled 2014-09-29: qty 1

## 2014-09-29 MED ORDER — SODIUM CHLORIDE 0.9 % IV SOLN
2.0000 mg/kg | Freq: Once | INTRAVENOUS | Status: AC
  Administered 2014-09-29: 175 mg via INTRAVENOUS
  Filled 2014-09-29: qty 7

## 2014-09-29 MED ORDER — SODIUM CHLORIDE 0.9 % IV SOLN
250.0000 mL | Freq: Once | INTRAVENOUS | Status: AC
  Administered 2014-09-29: 250 mL via INTRAVENOUS

## 2014-09-29 MED ORDER — PROCHLORPERAZINE MALEATE 10 MG PO TABS
10.0000 mg | ORAL_TABLET | Freq: Once | ORAL | Status: AC
  Administered 2014-09-29: 10 mg via ORAL

## 2014-09-29 NOTE — Progress Notes (Signed)
Okay to treat today with ANC of 1.4 per Dr. Julien Nordmann.

## 2014-09-29 NOTE — Telephone Encounter (Signed)
Gave avs & calendar for May. °

## 2014-09-29 NOTE — Patient Instructions (Signed)
[facility] Discharge Instructions for Patients Receiving Chemotherapy  Today you received the following chemotherapy agents: Keytruda   To help prevent nausea and vomiting after your treatment, we encourage you to take your nausea medication as directed    If you develop nausea and vomiting that is not controlled by your nausea medication, call the clinic.   BELOW ARE SYMPTOMS THAT SHOULD BE REPORTED IMMEDIATELY:  *FEVER GREATER THAN 100.5 F  *CHILLS WITH OR WITHOUT FEVER  NAUSEA AND VOMITING THAT IS NOT CONTROLLED WITH YOUR NAUSEA MEDICATION  *UNUSUAL SHORTNESS OF BREATH  *UNUSUAL BRUISING OR BLEEDING  TENDERNESS IN MOUTH AND THROAT WITH OR WITHOUT PRESENCE OF ULCERS  *URINARY PROBLEMS  *BOWEL PROBLEMS  UNUSUAL RASH Items with * indicate a potential emergency and should be followed up as soon as possible.  Feel free to call the clinic you have any questions or concerns. The clinic phone number is [phone].  Please show the CHEMO ALERT CARD at check-in to the Emergency Department and triage nurse.   

## 2014-09-29 NOTE — Progress Notes (Addendum)
[place] Telephone:([phone]) [phone]   [phone]) [phone]  OFFICE PROGRESS NOTE  [name],[name] [name], MD [place] [address]  DIAGNOSIS: Non-small cell carcinoma of lung, stage 4  Primary site: Lung (Right)  Staging method: AJCC 7th Edition  Clinical: Stage IV (T1a, N3, M1b) signed by Curt Bears, MD on 02/01/2014 1:42 PM  Summary: Stage IV (T1a, N3, M1b) Prostate cancer  Primary site: Prostate  Clinical: Stage I (T1c, NX, M0) signed by Wyatt Portela, MD on 08/06/2013 1:59 PM  Summary: Stage I (T1c, NX, M0)  PRIOR THERAPY: Systemic chemotherapy with carboplatin for an AUC of 5 and Alimta 500 mg per meter squared given every 3 weeks. Status post 6 cycles, last dose was given 05/20/2014 discontinued secondary to disease progression.  CURRENT THERAPY: Immunotherapy with Keytruda (pembrolizumab) 2 MG/KG every 3 weeks. First dose 07/14/2014. He has positive PDL 1 expression (90%). Status post 3 cycles.  INTERVAL HISTORY: Nicholas Schmitt 79 y.o. male returns to the clinic today for follow-up visit accompanied by his wife. The patient completed palliative radiotherapy to the right upper chest lesion in addition to the lumbar spine under the care of Dr. Sondra Come. He is now being treated with immunotherapy with [facility], now status post 3 cycles.  He denied having any  nausea or vomiting. He reports continued  difficulty swallowing after completing radiation therapy. He was seen by an ENT specialist who, per the patient, put him on Prilosec. He has not been evaluated by a gastroenterologist. He complains of having decreased appetite and has been evaluated recently by our nutritionist. His weight is currently down approximately 6 pounds. He reports improved stamina with walking and feels that he is breathing better. The patient denied having any significant chest pain,  cough or hemoptysis. He denied diarrhea, increased shortness of breath or skin  rash.  He presents to proceed with cycle #4. He recently had a restaging CT scan of the chest, abdomen and pelvis and presents to discuss the results.  MEDICAL HISTORY: Past Medical History  Diagnosis Date  . Hypertension   . CAD (coronary artery disease)     positive stress test in 2006 led to left heart cath (8/06) showing 95% prox RCA, 90% CFX, and 90% mLAD.  patient had a cypher DES to all 3 lesions  . Hyperlipidemia   . Hearing loss   . AAA (abdominal aortic aneurysm) 11/09    3.2 cm   . Chronic renal insufficiency   . History of radiation therapy 03/08/04- 04/08/04    prostate 4600 cGy in 3 fractions, radioactive seed implant 05/04/04  . Myocardial infarction 1995  . Pacemaker 2014  . Dysrhythmia   . Shortness of breath     exertion  . Asthma     bronchial  . COPD (chronic obstructive pulmonary disease)   . Cancer 2015    prostate   . Prostate cancer 2005    gleason 7  . Skin cancer     ear  . Lung cancer   . Lung cancer   . Radiation Jan.11-Jan 29/2016    Right central chest 35 gray in 14 fractions  . Radiation Jan.2016    35 gray in 14 fx lower lumbar/upper sacrum    ALLERGIES:  has No Known Allergies.  MEDICATIONS:  Current Outpatient Prescriptions  Medication Sig Dispense Refill  . acetaminophen (TYLENOL) 500 MG tablet Take 500 mg by mouth every 6 (six) hours as needed. 2 tabs    .  albuterol (PROVENTIL,VENTOLIN) 90 MCG/ACT inhaler Inhale 2 puffs into the lungs every 6 (six) hours as needed for wheezing or shortness of breath.    . ALPRAZolam (XANAX) 0.25 MG tablet Take 1 tablet (0.25 mg total) by mouth at bedtime as needed for anxiety. 30 tablet 0  . budesonide-formoterol (SYMBICORT) 160-4.5 MCG/ACT inhaler Inhale 2 puffs into the lungs 2 (two) times daily.    . hyaluronate sodium (RADIAPLEXRX) GEL Apply 1 application topically once.    . Multiple Vitamin (MULTIVITAMIN) tablet Take 1 tablet by mouth daily.     . naproxen sodium (ANAPROX) 220 MG tablet Take 440  mg by mouth 2 (two) times daily as needed (pain).     [name] oxyCODONE (OXY IR/ROXICODONE) 5 MG immediate release tablet Take 1-2 tablets (5-10 mg total) by mouth every 4 (four) hours as needed for moderate pain. 30 tablet 0  . prochlorperazine (COMPAZINE) 10 MG tablet Take 1 tablet (10 mg total) by mouth every 6 (six) hours as needed for nausea or vomiting. 30 tablet 0  . sucralfate (CARAFATE) 1 G tablet Take 1 g by mouth 4 (four) times daily -  with meals and at bedtime. Dissolve tablets in 15-30 ml of water before taking.  Disp. 42 tablets. 0 refills. Called in refill to [facility] per Dr. Sondra Come on 09/02/14.    [name] traMADol (ULTRAM) 50 MG tablet Take 1 tablet (50 mg total) by mouth every 6 (six) hours as needed for moderate pain. 30 tablet 0  . fluticasone (FLONASE) 50 MCG/ACT nasal spray   5  . nitroGLYCERIN (NITROSTAT) 0.4 MG SL tablet Place 0.4 mg under the tongue every 5 (five) minutes as needed for chest pain (MAX 3 TABLETS).    [name] omeprazole (PRILOSEC) 40 MG capsule   0   No current facility-administered medications for this visit.   Facility-Administered Medications Ordered in Other Visits  Medication Dose Route Frequency Provider Last Rate Last Dose  . heparin lock flush 100 unit/mL  500 Units Intracatheter Daily PRN Tyrrell Stephens E Cordarro Spinnato, PA-C      . sodium chloride 0.9 % injection 10 mL  10 mL Intracatheter PRN Suan Pyeatt E Keylin Ferryman, PA-C      . sodium chloride 0.9 % injection 3 mL  3 mL Intracatheter PRN Carlton Adam, PA-C        SURGICAL HISTORY:  Past Surgical History  Procedure Laterality Date  . Ptca  2006  . Coronary stent placement    . Permanent pacemaker insertion Bilateral 02/26/13    MDT Adapta L implanted by Dr Rayann Heman for sick sinus syndrome  . Radioactive seed implant  05/04/2004    8000 cGy, Dr Danny Lawless Dr Reece Agar  . Elbow surgery Bilateral 1973  . Insert / replace / remove pacemaker    . Coronary angioplasty    . Mediastinoscopy N/A 01/28/2014    Procedure:  MEDIASTINOSCOPY;  Surgeon: Melrose Nakayama, MD;  Location: [place];  Service: Thoracic;  Laterality: N/A;  . Permanent pacemaker insertion N/A 02/26/2013    Procedure: PERMANENT PACEMAKER INSERTION;  Surgeon: [name], MD;  Location: [facility];  Service: Cardiovascular;  Laterality: N/A;    REVIEW OF SYSTEMS:  Constitutional: positive for fatigue Eyes: negative Ears, nose, mouth, throat, and face: negative Respiratory: positive for dyspnea on exertion and improved Cardiovascular: negative Gastrointestinal: negative Genitourinary:negative Integument/breast: negative Hematologic/lymphatic: negative Musculoskeletal:negative Neurological: negative Behavioral/Psych: negative Endocrine: negative Allergic/Immunologic: negative   PHYSICAL EXAMINATION: General appearance: alert, cooperative, fatigued and no distress Head: Normocephalic, without  obvious abnormality, atraumatic Neck: no adenopathy, no carotid bruit, supple, symmetrical, trachea midline and thyroid not enlarged, symmetric, no tenderness/mass/nodules Lymph nodes: Cervical, supraclavicular, and axillary nodes normal. Resp: clear to auscultation bilaterally Back: symmetric, no curvature. ROM normal. No CVA tenderness. Cardio: regular rate and rhythm, S1, S2 normal, no murmur, click, rub or gallop GI: soft, non-tender; bowel sounds normal; no masses,  no organomegaly Extremities: extremities normal, atraumatic, no cyanosis or edema Neurologic: Alert and oriented X 3, normal strength and tone. Normal symmetric reflexes. Normal coordination and gait Skin: no skin lesions or rashes  ECOG PERFORMANCE STATUS: 1 - Symptomatic but completely ambulatory  Blood pressure 130/77, pulse 84, temperature 97.8 F (36.6 C), temperature source Oral, resp. rate 18, height 5\' 11"  (1.803 m), weight 185 lb 11.2 oz (84.233 kg), SpO2 96 %.  LABORATORY DATA: Lab Results  Component Value Date   WBC 2.6* 09/29/2014   HGB 10.9* 09/29/2014    HCT 33.7* 09/29/2014   MCV 93.1 09/29/2014   PLT 137* 09/29/2014      Chemistry      Component Value Date/Time   NA 141 09/29/2014 0954   NA 139 07/24/2014 0436   K 4.4 09/29/2014 0954   K 4.1 07/24/2014 0436   CL 102 07/24/2014 0436   CO2 24 09/29/2014 0954   CO2 26 07/24/2014 0436   BUN 10.4 09/29/2014 0954   BUN 16 07/24/2014 0436   CREATININE 0.7 09/29/2014 0954   CREATININE 0.74 07/24/2014 0436      Component Value Date/Time   CALCIUM 9.2 09/29/2014 0954   CALCIUM 9.3 07/24/2014 0436   ALKPHOS 107 09/29/2014 0954   ALKPHOS 66 07/21/2014 1821   AST 17 09/29/2014 0954   AST 43* 07/21/2014 1821   ALT 14 09/29/2014 0954   ALT 37 07/21/2014 1821   BILITOT 0.74 09/29/2014 0954   BILITOT 1.2 07/21/2014 1821       RADIOGRAPHIC STUDIES: Ct Chest W Contrast  09/26/2014   CLINICAL DATA:  Right-sided lung cancer diagnosed 8/15. Prostate cancer diagnosed in 2014. Metastasis to bone.  EXAM: CT CHEST, ABDOMEN, AND PELVIS WITH CONTRAST  TECHNIQUE: Multidetector CT imaging of the chest, abdomen and pelvis was performed following the standard protocol during bolus administration of intravenous contrast.  CONTRAST:  159mL OMNIPAQUE IOHEXOL 300 MG/ML  SOLN  COMPARISON:  Chest CT 07/14/2014.  Abdominal pelvic CT of 06/04/2014  FINDINGS: CT CHEST FINDINGS  Mediastinum/Nodes: Soft tissue fullness in the right supraclavicular region, without well-defined adenopathy. Example image 3 of series 2. Aortic and branch vessel atherosclerosis. Normal heart size, without pericardial effusion. Pacer/AICD device. No central pulmonary embolism, on this non-dedicated study.  Right paratracheal adenopathy at 1.6 cm on image 17. Compare 2.2 cm on the prior. A subcarinal node measures 1.3 cm today versus 1.4 cm on the prior.  The previously described right hilar adenopathy is improved to resolved. Narrowing of the SVC again identified with collateral vessels about the posterior chest wall.  Lungs/Pleura: A small  right pleural effusion is new since the prior CT.  Secretions in the dependent trachea. 11 mm right apical spiculated nodule on image 9 is slightly less well-defined than at 12 mm on the prior.  Posterior right apical 10 mm density described on the prior exam is at the site of presumed radiation change today. Example image 9. New or progressive right paramediastinal radiation fibrosis.  Musculoskeletal: Lucent lesion within the posterior fifth right rib is new at 6 mm on image 18.  CT ABDOMEN  AND PELVIS FINDINGS  Hepatobiliary: Hepatic cysts. Normal gallbladder, without biliary ductal dilatation.  Pancreas: Normal, without mass or ductal dilatation.  Spleen: Normal  Adrenals/Urinary Tract: Right adrenal 2.8 x 1.5 cm nodule on image 47. Decreased and 3.8 x 1.7 cm on the prior exam. No left adrenal lesion.  Bilateral renal cysts.  No hydronephrosis.  Normal urinary bladder.  Stomach/Bowel: Normal stomach, without wall thickening. Normal colon, appendix, and terminal ileum. Normal small bowel.  Vascular/Lymphatic: Aortic and branch vessel atherosclerosis. Non aneurysmal dilatation of the infrarenal aorta at 3.1 cm, similar. A subtle right perirenal nodule measures 6 mm on image 53 and is new. Otherwise, no retroperitoneal adenopathy. No pelvic adenopathy.  Reproductive: Radiation seeds in the prostate.  Other: No significant free fluid.  Musculoskeletal: Right sacral lesion measures 2.9 cm today versus 2.3 cm on the prior.  Right-sided L5 vertebral body lesion measures 1.7 cm today versus 1.4 cm on the prior.  IMPRESSION: CT CHEST IMPRESSION  1. Since 07/14/2014, response to therapy of right-sided pulmonary nodules and thoracic adenopathy. 2. Small right pleural effusion is new since the prior CT. 3. Persistent mass effect upon the SVC with collateral vessels about the chest wall. 4. Suspect a new posterior right fifth rib osseous metastasis of 6 mm.  CT ABDOMEN AND PELVIS IMPRESSION  1. Decrease in right adrenal  metastasis. 2. Suspect slight increase in osseous metastasis. 3. Subtle right perinephric nodule is new. Isolated nodal metastasis cannot be excluded. 4. Similar aortic dilatation.   Electronically Signed   By: Abigail Miyamoto M.D.   On: 09/26/2014 11:38   Ct Abdomen Pelvis W Contrast  09/26/2014   CLINICAL DATA:  Right-sided lung cancer diagnosed 8/15. Prostate cancer diagnosed in 2014. Metastasis to bone.  EXAM: CT CHEST, ABDOMEN, AND PELVIS WITH CONTRAST  TECHNIQUE: Multidetector CT imaging of the chest, abdomen and pelvis was performed following the standard protocol during bolus administration of intravenous contrast.  CONTRAST:  167mL OMNIPAQUE IOHEXOL 300 MG/ML  SOLN  COMPARISON:  Chest CT 07/14/2014.  Abdominal pelvic CT of 06/04/2014  FINDINGS: CT CHEST FINDINGS  Mediastinum/Nodes: Soft tissue fullness in the right supraclavicular region, without well-defined adenopathy. Example image 3 of series 2. Aortic and branch vessel atherosclerosis. Normal heart size, without pericardial effusion. Pacer/AICD device. No central pulmonary embolism, on this non-dedicated study.  Right paratracheal adenopathy at 1.6 cm on image 17. Compare 2.2 cm on the prior. A subcarinal node measures 1.3 cm today versus 1.4 cm on the prior.  The previously described right hilar adenopathy is improved to resolved. Narrowing of the SVC again identified with collateral vessels about the posterior chest wall.  Lungs/Pleura: A small right pleural effusion is new since the prior CT.  Secretions in the dependent trachea. 11 mm right apical spiculated nodule on image 9 is slightly less well-defined than at 12 mm on the prior.  Posterior right apical 10 mm density described on the prior exam is at the site of presumed radiation change today. Example image 9. New or progressive right paramediastinal radiation fibrosis.  Musculoskeletal: Lucent lesion within the posterior fifth right rib is new at 6 mm on image 18.  CT ABDOMEN AND PELVIS  FINDINGS  Hepatobiliary: Hepatic cysts. Normal gallbladder, without biliary ductal dilatation.  Pancreas: Normal, without mass or ductal dilatation.  Spleen: Normal  Adrenals/Urinary Tract: Right adrenal 2.8 x 1.5 cm nodule on image 47. Decreased and 3.8 x 1.7 cm on the prior exam. No left adrenal lesion.  Bilateral renal cysts.  No hydronephrosis.  Normal urinary bladder.  Stomach/Bowel: Normal stomach, without wall thickening. Normal colon, appendix, and terminal ileum. Normal small bowel.  Vascular/Lymphatic: Aortic and branch vessel atherosclerosis. Non aneurysmal dilatation of the infrarenal aorta at 3.1 cm, similar. A subtle right perirenal nodule measures 6 mm on image 53 and is new. Otherwise, no retroperitoneal adenopathy. No pelvic adenopathy.  Reproductive: Radiation seeds in the prostate.  Other: No significant free fluid.  Musculoskeletal: Right sacral lesion measures 2.9 cm today versus 2.3 cm on the prior.  Right-sided L5 vertebral body lesion measures 1.7 cm today versus 1.4 cm on the prior.  IMPRESSION: CT CHEST IMPRESSION  1. Since 07/14/2014, response to therapy of right-sided pulmonary nodules and thoracic adenopathy. 2. Small right pleural effusion is new since the prior CT. 3. Persistent mass effect upon the SVC with collateral vessels about the chest wall. 4. Suspect a new posterior right fifth rib osseous metastasis of 6 mm.  CT ABDOMEN AND PELVIS IMPRESSION  1. Decrease in right adrenal metastasis. 2. Suspect slight increase in osseous metastasis. 3. Subtle right perinephric nodule is new. Isolated nodal metastasis cannot be excluded. 4. Similar aortic dilatation.   Electronically Signed   By: Abigail Miyamoto M.D.   On: 09/26/2014 11:38    ASSESSMENT AND PLAN: this is a very pleasant 78 years old white male with stage IV non-small cell lung cancer, adenocarcinoma with positive PDL 1 expression, completed systemic chemotherapy with carboplatin and Alimta status post 6 cycles and tolerated his  treatment fairly well.  The recent CT scan of the chest, abdomen and pelvis showed evidence for disease progression with new spiculated left upper lobe nodule in addition to increased in the size of the right adrenal gland. The patient is being treated with Keytruda at 2 mg/kg given every 3 weeks, status post 3 cycles. Patient was discussed with and also seen by Dr. Julien Nordmann.  His recent CT scan showed improvement in the thoracic and right adrenal disease. There were some areas of small (6 mm) osseous metastasis noted. The results were discussed with the patient and his wife. He will proceed with cycle #4 of Keytruda today as scheduled. He will follow-up in 3 weeks prior to cycle #5.   The patient was advised to call immediately if he has any concerning symptoms in the interval. The patient voices understanding of current disease status and treatment options and is in agreement with the current care plan.  All questions were answered. The patient knows to call the clinic with any problems, questions or concerns. We can certainly see the patient much sooner if necessary.  Carlton Adam, PA-C 09/29/2014  ADDENDUM: Hematology/Oncology Attending: I had a face to face encounter with the patient. I recommended his care plan. This is a very pleasant 79 years old white male with metastatic non-small cell lung cancer, adenocarcinoma with positive PDL 1 expression and currently undergoing treatment with immunotherapy with Ketruda (pembrolizumab) status post 3 cycles. The patient tolerated the last cycle of his treatment fairly well. He has improvement in his shortness of breath over the last few weeks. The patient denied having any significant nausea or vomiting, no diarrhea. His recent CT scan of the chest, abdomen and pelvis showed improvement in his disease in the chest and right adrenal gland but there was concerning osseous metastasis. I discussed the scan results with the patient and his wife. I  recommended for him to continue with the immunotherapy with Ketruda (pembrolizumab) for 3 more cycles. We will  continue to monitor the osseous metastasis is closely on the upcoming scan. The patient and his wife agreed to the current plan. He was advised to call immediately if he has any concerning symptoms in the interval.  Disclaimer: This note was dictated with voice recognition software. Similar sounding words can inadvertently be transcribed and may not be corrected upon review. Eilleen Kempf., MD 09/29/2014

## 2014-09-29 NOTE — Progress Notes (Signed)
Nutrition follow-up completed with patient who is receiving chemotherapy for stage IV lung cancer. Weight has decreased and was documented as 187 pounds April 18, down from 191 pounds March 28. Patient complains of poor appetite and taste alterations. Patient also reports constipation.   He has difficulty swallowing. He drinks one oral nutrition supplement daily.  Nutrition diagnosis: Food and nutrition related knowledge deficit continues.  Intervention:  Educated patient on strategies to improve constipation.  Encouraged patient to discuss this with physician if medications don't improve constipation. Educated patient on strategies for rinsing his mouth prior to eating.  Also provided information on enhancing taste of food. Encouraged patient to consume high-calorie high-protein foods frequently throughout the day. Recommended patient increase oral nutrition supplements twice a day between meals. Questions were answered.  Teach back method used.  Monitoring, evaluation, goals: Patient has experienced weight loss since last visit.  He will increase oral nutrition supplements twice a day.  Next visit: Monday, May 9, during chemotherapy.  **Disclaimer: This note was dictated with voice recognition software. Similar sounding words can inadvertently be transcribed and this note may contain transcription errors which may not have been corrected upon publication of note.**

## 2014-09-29 NOTE — Patient Instructions (Signed)
Your CT scan showed improvement inthe disease in your chest and right adrenal gland. There are some small (87mm) areas on the bones that we will continue to monitor on upcoming CT scans Follow up in 3 weeks

## 2014-10-06 ENCOUNTER — Telehealth: Payer: Self-pay | Admitting: Oncology

## 2014-10-06 NOTE — Telephone Encounter (Signed)
Nicholas Schmitt, Nicholas Schmitt's wife called and said he is still having trouble swallowing.  He saw Dr. Lucia Gaskins and was put on Prilosec.  He is not longer taking Carafate.  He is able to drink [employer] and yogurt.  He is feeling weak from not being able to eat.  Nicholas Schmitt is wondering if he needs to see a gastroenterologist or Dr. Sondra Come again.  Advised her that Dr. Sondra Come is out of the office until this afternoon so we will call her back latter today or tomorrow morning.  Nicholas Schmitt verbalized agreement.

## 2014-10-07 ENCOUNTER — Telehealth: Payer: Self-pay | Admitting: Oncology

## 2014-10-07 NOTE — Telephone Encounter (Signed)
Called Dr. Pollie Friar office to have office notes faxed from 09/12/14.  The receptionist said she would fax them this morning.

## 2014-10-07 NOTE — Telephone Encounter (Signed)
Jaclyn called to see if Dr. Sondra Come has decided whether he needs a GI evaluation.  Advised him that Dr. Sondra Come needs to review Dr. Pollie Friar notes and then we will give him a call back.  Jamonte verbalized agreement.

## 2014-10-08 ENCOUNTER — Encounter: Payer: Self-pay | Admitting: Internal Medicine

## 2014-10-08 ENCOUNTER — Telehealth: Payer: Self-pay | Admitting: *Deleted

## 2014-10-08 ENCOUNTER — Other Ambulatory Visit: Payer: Self-pay | Admitting: Oncology

## 2014-10-08 ENCOUNTER — Telehealth: Payer: Self-pay | Admitting: Oncology

## 2014-10-08 DIAGNOSIS — C3490 Malignant neoplasm of unspecified part of unspecified bronchus or lung: Secondary | ICD-10-CM

## 2014-10-08 NOTE — Telephone Encounter (Signed)
Called [place] to see if he has seen a gastroenterologist in the past.  He said he has not.  Advised him a referral will be put in for Dr. Carlean Purl at [facility].

## 2014-10-08 NOTE — Telephone Encounter (Signed)
Called patient to inform of appt. With Edward Jolly (PA) on 10-28-14 - arrival time - 9:45 am, lvm for a return call

## 2014-10-09 ENCOUNTER — Telehealth: Payer: Self-pay | Admitting: *Deleted

## 2014-10-09 NOTE — Telephone Encounter (Signed)
Patient called and stated,"Dr. Lucia Gaskins started me on prednisone 10 mg/day x 5 days. I want Dr. Julien Nordmann to know this." Instructed patient that this message would be given to Dr. Julien Nordmann and his nurse. Patient verbalized understanding.

## 2014-10-10 ENCOUNTER — Encounter: Payer: Self-pay | Admitting: Medical Oncology

## 2014-10-10 NOTE — Telephone Encounter (Signed)
Added to med list

## 2014-10-11 NOTE — Telephone Encounter (Signed)
Prednisone 10 mg or less is ok.

## 2014-10-13 ENCOUNTER — Ambulatory Visit (INDEPENDENT_AMBULATORY_CARE_PROVIDER_SITE_OTHER): Payer: Medicare Other | Admitting: Family Medicine

## 2014-10-13 ENCOUNTER — Encounter: Payer: Self-pay | Admitting: Family Medicine

## 2014-10-13 VITALS — BP 120/80 | HR 80 | Temp 97.8°F | Wt 181.0 lb

## 2014-10-13 DIAGNOSIS — C3490 Malignant neoplasm of unspecified part of unspecified bronchus or lung: Secondary | ICD-10-CM | POA: Diagnosis not present

## 2014-10-13 NOTE — Progress Notes (Signed)
Pre visit review using our clinic review tool, if applicable. No additional management support is needed unless otherwise documented below in the visit note. 

## 2014-10-13 NOTE — Progress Notes (Signed)
   Subjective:    Patient ID: Nicholas Schmitt, male    DOB: 12/29/1935, 79 y.o.   MRN: [id number]  HPI Nicholas Schmitt is a 79 year old married male ex-smoker who comes in today for multiple issues  He has stage IV non-small cell lung cancer. Recent workup in February showed progression of disease. He's on immunotherapy. His weight is steady at 181. He recently saw Dr. Lucia Gaskins who put a month's prednisone 10 mg a day. He's not having major difficulty swallowing or breathing. There was a note about seeing a gastroenterologist but with his stage IV disease am not sure that has any value.   Review of Systems    review of systems otherwise negative Objective:   Physical Exam  Well-developed well-nourished male no acute distress vital signs stable he is afebrile weight 181 examination oral cavity showed upper and lower dentures. Oral cavity is clear neck was supple no adenopathy lungs shows rhonchi throughout both 1 feels worse on the right than the left      Assessment & Plan:  Stage IV metastatic non-small cell cancer,,,,,,,,, continue immunotherapy and current meds

## 2014-10-20 ENCOUNTER — Ambulatory Visit (HOSPITAL_BASED_OUTPATIENT_CLINIC_OR_DEPARTMENT_OTHER): Payer: Medicare Other

## 2014-10-20 ENCOUNTER — Ambulatory Visit: Payer: Medicare Other | Admitting: Nutrition

## 2014-10-20 ENCOUNTER — Encounter: Payer: Self-pay | Admitting: Physician Assistant

## 2014-10-20 ENCOUNTER — Ambulatory Visit (HOSPITAL_BASED_OUTPATIENT_CLINIC_OR_DEPARTMENT_OTHER): Payer: Medicare Other | Admitting: Physician Assistant

## 2014-10-20 ENCOUNTER — Telehealth: Payer: Self-pay | Admitting: Internal Medicine

## 2014-10-20 ENCOUNTER — Other Ambulatory Visit (HOSPITAL_BASED_OUTPATIENT_CLINIC_OR_DEPARTMENT_OTHER): Payer: Medicare Other

## 2014-10-20 VITALS — BP 117/62 | HR 77 | Temp 98.1°F | Resp 18 | Ht 71.0 in | Wt 184.1 lb

## 2014-10-20 DIAGNOSIS — Z5112 Encounter for antineoplastic immunotherapy: Secondary | ICD-10-CM

## 2014-10-20 DIAGNOSIS — C7971 Secondary malignant neoplasm of right adrenal gland: Secondary | ICD-10-CM

## 2014-10-20 DIAGNOSIS — C3411 Malignant neoplasm of upper lobe, right bronchus or lung: Secondary | ICD-10-CM | POA: Diagnosis not present

## 2014-10-20 DIAGNOSIS — Z8546 Personal history of malignant neoplasm of prostate: Secondary | ICD-10-CM

## 2014-10-20 DIAGNOSIS — C3491 Malignant neoplasm of unspecified part of right bronchus or lung: Secondary | ICD-10-CM

## 2014-10-20 LAB — COMPREHENSIVE METABOLIC PANEL (CC13)
ALT: 15 U/L (ref 0–55)
ANION GAP: 10 meq/L (ref 3–11)
AST: 13 U/L (ref 5–34)
Albumin: 3.3 g/dL — ABNORMAL LOW (ref 3.5–5.0)
Alkaline Phosphatase: 102 U/L (ref 40–150)
BUN: 14.5 mg/dL (ref 7.0–26.0)
CHLORIDE: 109 meq/L (ref 98–109)
CO2: 22 mEq/L (ref 22–29)
Calcium: 9 mg/dL (ref 8.4–10.4)
Creatinine: 0.8 mg/dL (ref 0.7–1.3)
EGFR: 88 mL/min/{1.73_m2} — ABNORMAL LOW (ref >90)
GLUCOSE: 101 mg/dL (ref 70–140)
Potassium: 4.2 mEq/L (ref 3.5–5.1)
Sodium: 141 mEq/L (ref 136–145)
Total Bilirubin: 0.74 mg/dL (ref 0.20–1.20)
Total Protein: 6.1 g/dL — ABNORMAL LOW (ref 6.4–8.3)

## 2014-10-20 LAB — CBC WITH DIFFERENTIAL/PLATELET
BASO%: 0.3 % (ref 0.0–2.0)
BASOS ABS: 0 10*3/uL (ref 0.0–0.1)
EOS ABS: 0.3 10*3/uL (ref 0.0–0.5)
EOS%: 7.7 % — AB (ref 0.0–7.0)
HCT: 36.9 % — ABNORMAL LOW (ref 38.4–49.9)
HEMOGLOBIN: 12 g/dL — AB (ref 13.0–17.1)
LYMPH%: 15 % (ref 14.0–49.0)
MCH: 31.4 pg (ref 27.2–33.4)
MCHC: 32.5 g/dL (ref 32.0–36.0)
MCV: 96.6 fL (ref 79.3–98.0)
MONO#: 0.4 10*3/uL (ref 0.1–0.9)
MONO%: 11.3 % (ref 0.0–14.0)
NEUT%: 65.7 % (ref 39.0–75.0)
NEUTROS ABS: 2.5 10*3/uL (ref 1.5–6.5)
Platelets: 101 10*3/uL — ABNORMAL LOW (ref 140–400)
RBC: 3.82 10*6/uL — ABNORMAL LOW (ref 4.20–5.82)
RDW: 16.8 % — ABNORMAL HIGH (ref 11.0–14.6)
WBC: 3.8 10*3/uL — ABNORMAL LOW (ref 4.0–10.3)
lymph#: 0.6 10*3/uL — ABNORMAL LOW (ref 0.9–3.3)

## 2014-10-20 LAB — TSH CHCC: TSH: 3.35 m(IU)/L (ref 0.320–4.118)

## 2014-10-20 MED ORDER — SODIUM CHLORIDE 0.9 % IV SOLN
Freq: Once | INTRAVENOUS | Status: AC
  Administered 2014-10-20: 10:00:00 via INTRAVENOUS

## 2014-10-20 MED ORDER — PEMBROLIZUMAB CHEMO INJECTION 100 MG/4ML
2.0000 mg/kg | Freq: Once | INTRAVENOUS | Status: AC
  Administered 2014-10-20: 175 mg via INTRAVENOUS
  Filled 2014-10-20: qty 7

## 2014-10-20 MED ORDER — ALPRAZOLAM 0.25 MG PO TABS: 0.2500 mg | ORAL_TABLET | Freq: Every evening | ORAL | Status: DC | PRN

## 2014-10-20 MED ORDER — PROCHLORPERAZINE MALEATE 10 MG PO TABS
10.0000 mg | ORAL_TABLET | Freq: Once | ORAL | Status: AC
  Administered 2014-10-20: 10 mg via ORAL

## 2014-10-20 MED ORDER — PROCHLORPERAZINE MALEATE 10 MG PO TABS
ORAL_TABLET | ORAL | Status: AC
  Filled 2014-10-20: qty 1

## 2014-10-20 NOTE — Telephone Encounter (Signed)
no pof sent-added lab for pt next trmt

## 2014-10-20 NOTE — Progress Notes (Addendum)
[place] [phone]   [phone]  OFFICE PROGRESS NOTE  [name],[name] [name], MD [place] [address]  DIAGNOSIS: Non-small cell carcinoma of lung, stage 4  Primary site: Lung (Right)  Staging method: AJCC 7th Edition  Clinical: Stage IV (T1a, N3, M1b) signed by Curt Bears, MD on 02/01/2014 1:42 PM  Summary: Stage IV (T1a, N3, M1b) Prostate cancer  Primary site: Prostate  Clinical: Stage I (T1c, NX, M0) signed by Wyatt Portela, MD on 08/06/2013 1:59 PM  Summary: Stage I (T1c, NX, M0)  PRIOR THERAPY: Systemic chemotherapy with carboplatin for an AUC of 5 and Alimta 500 mg per meter squared given every 3 weeks. Status post 6 cycles, last dose was given 05/20/2014 discontinued secondary to disease progression.  CURRENT THERAPY: Immunotherapy with Keytruda (pembrolizumab) 2 MG/KG every 3 weeks. First dose 07/14/2014. He has positive PDL 1 expression (90%). Status post 4 cycles.  INTERVAL HISTORY: VIN YONKE 79 y.o. male returns to the clinic today for follow-up visit accompanied by his wife. The patient completed palliative radiotherapy to the right upper chest lesion in addition to the lumbar spine under the care of Dr. Sondra Come. He is now being treated with immunotherapy with [facility], now status post 4 cycles.  He denied having any  nausea or vomiting. He continues to complain of difficulty swallowing after completing radiation therapy. He was seen by an ENT specialist who, per the patient, put him on Prilosec. He is scheduled to be evaluated by gastroenterologist on 10/28/2014 regarding these symptoms.  He complains of having decreased appetite and has been evaluated recently by our nutritionist. He has lost an additional pound and a half since his last office visit  He reports improved stamina with walking and feels that he is breathing better. The patient denied having any significant chest pain,  cough or  hemoptysis. He denied diarrhea, increased shortness of breath or skin rash. He does report constipation which  has been a long-standing issue and predates treatment with Keytruda. He requests a refill for his Xanax tablets. He presents to proceed with cycle #5.   MEDICAL HISTORY: Past Medical History  Diagnosis Date  . Hypertension   . CAD (coronary artery disease)     positive stress test in 2006 led to left heart cath (8/06) showing 95% prox RCA, 90% CFX, and 90% mLAD.  patient had a cypher DES to all 3 lesions  . Hyperlipidemia   . Hearing loss   . AAA (abdominal aortic aneurysm) 11/09    3.2 cm   . Chronic renal insufficiency   . History of radiation therapy 03/08/04- 04/08/04    prostate 4600 cGy in 3 fractions, radioactive seed implant 05/04/04  . Myocardial infarction 1995  . Pacemaker 2014  . Dysrhythmia   . Shortness of breath     exertion  . Asthma     bronchial  . COPD (chronic obstructive pulmonary disease)   . Cancer 2015    prostate   . Prostate cancer 2005    gleason 7  . Skin cancer     ear  . Lung cancer   . Lung cancer   . Radiation Jan.11-Jan 29/2016    Right central chest 35 gray in 14 fractions  . Radiation Jan.2016    35 gray in 14 fx lower lumbar/upper sacrum    ALLERGIES:  has No Known Allergies.  MEDICATIONS:  Current Outpatient Prescriptions  Medication Sig Dispense Refill  .  acetaminophen (TYLENOL) 500 MG tablet Take 500 mg by mouth every 6 (six) hours as needed. 2 tabs    . albuterol (PROVENTIL,VENTOLIN) 90 MCG/ACT inhaler Inhale 2 puffs into the lungs every 6 (six) hours as needed for wheezing or shortness of breath.    . ALPRAZolam (XANAX) 0.25 MG tablet Take 1 tablet (0.25 mg total) by mouth at bedtime as needed for anxiety. 30 tablet 0  . budesonide-formoterol (SYMBICORT) 160-4.5 MCG/ACT inhaler Inhale 2 puffs into the lungs 2 (two) times daily.    . fluticasone (FLONASE) 50 MCG/ACT nasal spray   5  . Multiple Vitamin (MULTIVITAMIN) tablet  Take 1 tablet by mouth daily.     . naproxen sodium (ANAPROX) 220 MG tablet Take 440 mg by mouth 2 (two) times daily as needed (pain).     [name] omeprazole (PRILOSEC) 40 MG capsule   0  . tamsulosin (FLOMAX) 0.4 MG CAPS capsule   2  . nitroGLYCERIN (NITROSTAT) 0.4 MG SL tablet Place 0.4 mg under the tongue every 5 (five) minutes as needed for chest pain (MAX 3 TABLETS).    [name] oxyCODONE (OXY IR/ROXICODONE) 5 MG immediate release tablet Take 1-2 tablets (5-10 mg total) by mouth every 4 (four) hours as needed for moderate pain. (Patient not taking: Reported on 10/20/2014) 30 tablet 0  . predniSONE (STERAPRED UNI-PAK 21 TAB) 10 MG (21) TBPK tablet   0  . prochlorperazine (COMPAZINE) 10 MG tablet Take 1 tablet (10 mg total) by mouth every 6 (six) hours as needed for nausea or vomiting. (Patient not taking: Reported on 10/13/2014) 30 tablet 0  . sucralfate (CARAFATE) 1 G tablet Take 1 g by mouth 4 (four) times daily -  with meals and at bedtime. Dissolve tablets in 15-30 ml of water before taking.  Disp. 42 tablets. 0 refills. Called in refill to [facility] per Dr. Sondra Come on 09/02/14.    [name] traMADol (ULTRAM) 50 MG tablet Take 1 tablet (50 mg total) by mouth every 6 (six) hours as needed for moderate pain. (Patient not taking: Reported on 10/20/2014) 30 tablet 0   No current facility-administered medications for this visit.   Facility-Administered Medications Ordered in Other Visits  Medication Dose Route Frequency Provider Last Rate Last Dose  . heparin lock flush 100 unit/mL  500 Units Intracatheter Daily PRN Anis Degidio E Lionardo Haze, PA-C      . sodium chloride 0.9 % injection 10 mL  10 mL Intracatheter PRN Zaccai Chavarin E Shanin Szymanowski, PA-C      . sodium chloride 0.9 % injection 3 mL  3 mL Intracatheter PRN Carlton Adam, PA-C        SURGICAL HISTORY:  Past Surgical History  Procedure Laterality Date  . Ptca  2006  . Coronary stent placement    . Permanent pacemaker insertion Bilateral 02/26/13    MDT Adapta L implanted  by Dr Rayann Heman for sick sinus syndrome  . Radioactive seed implant  05/04/2004    8000 cGy, Dr Danny Lawless Dr Reece Agar  . Elbow surgery Bilateral 1973  . Insert / replace / remove pacemaker    . Coronary angioplasty    . Mediastinoscopy N/A 01/28/2014    Procedure: MEDIASTINOSCOPY;  Surgeon: Melrose Nakayama, MD;  Location: [facility];  Service: Thoracic;  Laterality: N/A;  . Permanent pacemaker insertion N/A 02/26/2013    Procedure: PERMANENT PACEMAKER INSERTION;  Surgeon: [name], MD;  Location: [facility] CATH [facility];  Service: Cardiovascular;  Laterality: N/A;    REVIEW OF SYSTEMS:  Constitutional:  positive for fatigue and weight loss Eyes: negative Ears, nose, mouth, throat, and face: negative Respiratory: positive for dyspnea on exertion and improved Cardiovascular: negative Gastrointestinal: positive for constipation Genitourinary:negative Integument/breast: negative Hematologic/lymphatic: negative Musculoskeletal:negative Neurological: negative Behavioral/Psych: positive for anxiety Endocrine: negative Allergic/Immunologic: negative   PHYSICAL EXAMINATION: General appearance: alert, cooperative, fatigued and no distress Head: Normocephalic, without obvious abnormality, atraumatic Neck: no adenopathy, no carotid bruit, supple, symmetrical, trachea midline and thyroid not enlarged, symmetric, no tenderness/mass/nodules Lymph nodes: Cervical, supraclavicular, and axillary nodes normal. Resp: clear to auscultation bilaterally Back: symmetric, no curvature. ROM normal. No CVA tenderness. Cardio: regular rate and rhythm, S1, S2 normal, no murmur, click, rub or gallop GI: soft, non-tender; bowel sounds normal; no masses,  no organomegaly Extremities: extremities normal, atraumatic, no cyanosis or edema Neurologic: Alert and oriented X 3, normal strength and tone. Normal symmetric reflexes. Normal coordination and gait Skin: no skin lesions or rashes  ECOG PERFORMANCE STATUS: 1 -  Symptomatic but completely ambulatory  Blood pressure 117/62, pulse 77, temperature 98.1 F (36.7 C), temperature source Oral, resp. rate 18, height '5\' 11"'$  (1.610 m), weight 184 lb 1.6 oz (83.507 kg), SpO2 100 %.  LABORATORY DATA: Lab Results  Component Value Date   WBC 3.8* 10/20/2014   HGB 12.0* 10/20/2014   HCT 36.9* 10/20/2014   MCV 96.6 10/20/2014   PLT 101* 10/20/2014      Chemistry      Component Value Date/Time   NA 141 10/20/2014 0826   NA 139 07/24/2014 0436   K 4.2 10/20/2014 0826   K 4.1 07/24/2014 0436   CL 102 07/24/2014 0436   CO2 22 10/20/2014 0826   CO2 26 07/24/2014 0436   BUN 14.5 10/20/2014 0826   BUN 16 07/24/2014 0436   CREATININE 0.8 10/20/2014 0826   CREATININE 0.74 07/24/2014 0436      Component Value Date/Time   CALCIUM 9.0 10/20/2014 0826   CALCIUM 9.3 07/24/2014 0436   ALKPHOS 102 10/20/2014 0826   ALKPHOS 66 07/21/2014 1821   AST 13 10/20/2014 0826   AST 43* 07/21/2014 1821   ALT 15 10/20/2014 0826   ALT 37 07/21/2014 1821   BILITOT 0.74 10/20/2014 0826   BILITOT 1.2 07/21/2014 1821       RADIOGRAPHIC STUDIES: Ct Chest W Contrast  09/26/2014   CLINICAL DATA:  Right-sided lung cancer diagnosed 8/15. Prostate cancer diagnosed in 2014. Metastasis to bone.  EXAM: CT CHEST, ABDOMEN, AND PELVIS WITH CONTRAST  TECHNIQUE: Multidetector CT imaging of the chest, abdomen and pelvis was performed following the standard protocol during bolus administration of intravenous contrast.  CONTRAST:  170m OMNIPAQUE IOHEXOL 300 MG/ML  SOLN  COMPARISON:  Chest CT 07/14/2014.  Abdominal pelvic CT of 06/04/2014  FINDINGS: CT CHEST FINDINGS  Mediastinum/Nodes: Soft tissue fullness in the right supraclavicular region, without well-defined adenopathy. Example image 3 of series 2. Aortic and branch vessel atherosclerosis. Normal heart size, without pericardial effusion. Pacer/AICD device. No central pulmonary embolism, on this non-dedicated study.  Right paratracheal  adenopathy at 1.6 cm on image 17. Compare 2.2 cm on the prior. A subcarinal node measures 1.3 cm today versus 1.4 cm on the prior.  The previously described right hilar adenopathy is improved to resolved. Narrowing of the SVC again identified with collateral vessels about the posterior chest wall.  Lungs/Pleura: A small right pleural effusion is new since the prior CT.  Secretions in the dependent trachea. 11 mm right apical spiculated nodule on image 9 is slightly less  well-defined than at 12 mm on the prior.  Posterior right apical 10 mm density described on the prior exam is at the site of presumed radiation change today. Example image 9. New or progressive right paramediastinal radiation fibrosis.  Musculoskeletal: Lucent lesion within the posterior fifth right rib is new at 6 mm on image 18.  CT ABDOMEN AND PELVIS FINDINGS  Hepatobiliary: Hepatic cysts. Normal gallbladder, without biliary ductal dilatation.  Pancreas: Normal, without mass or ductal dilatation.  Spleen: Normal  Adrenals/Urinary Tract: Right adrenal 2.8 x 1.5 cm nodule on image 47. Decreased and 3.8 x 1.7 cm on the prior exam. No left adrenal lesion.  Bilateral renal cysts.  No hydronephrosis.  Normal urinary bladder.  Stomach/Bowel: Normal stomach, without wall thickening. Normal colon, appendix, and terminal ileum. Normal small bowel.  Vascular/Lymphatic: Aortic and branch vessel atherosclerosis. Non aneurysmal dilatation of the infrarenal aorta at 3.1 cm, similar. A subtle right perirenal nodule measures 6 mm on image 53 and is new. Otherwise, no retroperitoneal adenopathy. No pelvic adenopathy.  Reproductive: Radiation seeds in the prostate.  Other: No significant free fluid.  Musculoskeletal: Right sacral lesion measures 2.9 cm today versus 2.3 cm on the prior.  Right-sided L5 vertebral body lesion measures 1.7 cm today versus 1.4 cm on the prior.  IMPRESSION: CT CHEST IMPRESSION  1. Since 07/14/2014, response to therapy of right-sided  pulmonary nodules and thoracic adenopathy. 2. Small right pleural effusion is new since the prior CT. 3. Persistent mass effect upon the SVC with collateral vessels about the chest wall. 4. Suspect a new posterior right fifth rib osseous metastasis of 6 mm.  CT ABDOMEN AND PELVIS IMPRESSION  1. Decrease in right adrenal metastasis. 2. Suspect slight increase in osseous metastasis. 3. Subtle right perinephric nodule is new. Isolated nodal metastasis cannot be excluded. 4. Similar aortic dilatation.   Electronically Signed   By: Abigail Miyamoto M.D.   On: 09/26/2014 11:38   Ct Abdomen Pelvis W Contrast  09/26/2014   CLINICAL DATA:  Right-sided lung cancer diagnosed 8/15. Prostate cancer diagnosed in 2014. Metastasis to bone.  EXAM: CT CHEST, ABDOMEN, AND PELVIS WITH CONTRAST  TECHNIQUE: Multidetector CT imaging of the chest, abdomen and pelvis was performed following the standard protocol during bolus administration of intravenous contrast.  CONTRAST:  180m OMNIPAQUE IOHEXOL 300 MG/ML  SOLN  COMPARISON:  Chest CT 07/14/2014.  Abdominal pelvic CT of 06/04/2014  FINDINGS: CT CHEST FINDINGS  Mediastinum/Nodes: Soft tissue fullness in the right supraclavicular region, without well-defined adenopathy. Example image 3 of series 2. Aortic and branch vessel atherosclerosis. Normal heart size, without pericardial effusion. Pacer/AICD device. No central pulmonary embolism, on this non-dedicated study.  Right paratracheal adenopathy at 1.6 cm on image 17. Compare 2.2 cm on the prior. A subcarinal node measures 1.3 cm today versus 1.4 cm on the prior.  The previously described right hilar adenopathy is improved to resolved. Narrowing of the SVC again identified with collateral vessels about the posterior chest wall.  Lungs/Pleura: A small right pleural effusion is new since the prior CT.  Secretions in the dependent trachea. 11 mm right apical spiculated nodule on image 9 is slightly less well-defined than at 12 mm on the prior.   Posterior right apical 10 mm density described on the prior exam is at the site of presumed radiation change today. Example image 9. New or progressive right paramediastinal radiation fibrosis.  Musculoskeletal: Lucent lesion within the posterior fifth right rib is new at 6 mm on image  18.  CT ABDOMEN AND PELVIS FINDINGS  Hepatobiliary: Hepatic cysts. Normal gallbladder, without biliary ductal dilatation.  Pancreas: Normal, without mass or ductal dilatation.  Spleen: Normal  Adrenals/Urinary Tract: Right adrenal 2.8 x 1.5 cm nodule on image 47. Decreased and 3.8 x 1.7 cm on the prior exam. No left adrenal lesion.  Bilateral renal cysts.  No hydronephrosis.  Normal urinary bladder.  Stomach/Bowel: Normal stomach, without wall thickening. Normal colon, appendix, and terminal ileum. Normal small bowel.  Vascular/Lymphatic: Aortic and branch vessel atherosclerosis. Non aneurysmal dilatation of the infrarenal aorta at 3.1 cm, similar. A subtle right perirenal nodule measures 6 mm on image 53 and is new. Otherwise, no retroperitoneal adenopathy. No pelvic adenopathy.  Reproductive: Radiation seeds in the prostate.  Other: No significant free fluid.  Musculoskeletal: Right sacral lesion measures 2.9 cm today versus 2.3 cm on the prior.  Right-sided L5 vertebral body lesion measures 1.7 cm today versus 1.4 cm on the prior.  IMPRESSION: CT CHEST IMPRESSION  1. Since 07/14/2014, response to therapy of right-sided pulmonary nodules and thoracic adenopathy. 2. Small right pleural effusion is new since the prior CT. 3. Persistent mass effect upon the SVC with collateral vessels about the chest wall. 4. Suspect a new posterior right fifth rib osseous metastasis of 6 mm.  CT ABDOMEN AND PELVIS IMPRESSION  1. Decrease in right adrenal metastasis. 2. Suspect slight increase in osseous metastasis. 3. Subtle right perinephric nodule is new. Isolated nodal metastasis cannot be excluded. 4. Similar aortic dilatation.   Electronically  Signed   By: Abigail Miyamoto M.D.   On: 09/26/2014 11:38    ASSESSMENT AND PLAN: this is a very pleasant 79 years old white male with stage IV non-small cell lung cancer, adenocarcinoma with positive PDL 1 expression, completed systemic chemotherapy with carboplatin and Alimta status post 6 cycles and tolerated his treatment fairly well.  The  CT scan of the chest, abdomen and pelvis showed evidence for disease progression with new spiculated left upper lobe nodule in addition to increased in the size of the right adrenal gland. The patient is being treated with Keytruda at 2 mg/kg given every 3 weeks, status post 3 cycles. Patient was discussed with and also seen by Dr. Julien Nordmann.  His recent CT scan showed improvement in the thoracic and right adrenal disease. There were some areas of small (6 mm) osseous metastasis noted.  He will proceed with cycle #5 of Keytruda today as scheduled. He will follow-up in 3 weeks prior to cycle #6. For constipation the patient was advised to take stool softeners and a Senokot S but to avoid overusage of these products so as not to cause diarrhea. Both patient and his wife voiced understanding. He was given a refill prescription for his Xanax tablets.  The patient was advised to call immediately if he has any concerning symptoms in the interval. The patient voices understanding of current disease status and treatment options and is in agreement with the current care plan.  All questions were answered. The patient knows to call the clinic with any problems, questions or concerns. We can certainly see the patient much sooner if necessary.  Carlton Adam, PA-C 10/20/2014  ADDENDUM: Hematology/Oncology Attending: I had a face to face encounter with the patient today. I recommended his care plan. This is a very pleasant 79 years old white male with a stage IV non-small cell lung cancer, adenocarcinoma was currently undergoing treatment with immunotherapy with Ketruda  (pembrolizumab) status post 4 cycles.  The patient history rating his treatment well with no significant adverse effects. I recommended for him to proceed with cycle #5 today as scheduled. The patient would come back for follow-up visit in 3 weeks for reevaluation before starting cycle #6. He was advised to call immediately if he has any concerning symptoms in the interval.  Disclaimer: This note was dictated with voice recognition software. Similar sounding words can inadvertently be transcribed and may not be corrected upon review. Eilleen Kempf., MD 10/20/2014

## 2014-10-20 NOTE — Patient Instructions (Signed)
Follow up in 3 weeks, prior to your next scheduled cycle of immunotherapy 

## 2014-10-20 NOTE — Patient Instructions (Signed)
[facility] Discharge Instructions for Patients Receiving Chemotherapy  Today you received the following chemotherapy agents Keytruda. To help prevent nausea and vomiting after your treatment, we encourage you to take your nausea medication (Compazine) as prescribed.   If you develop nausea and vomiting that is not controlled by your nausea medication, call the clinic.   BELOW ARE SYMPTOMS THAT SHOULD BE REPORTED IMMEDIATELY:  *FEVER GREATER THAN 100.5 F  *CHILLS WITH OR WITHOUT FEVER  NAUSEA AND VOMITING THAT IS NOT CONTROLLED WITH YOUR NAUSEA MEDICATION  *UNUSUAL SHORTNESS OF BREATH  *UNUSUAL BRUISING OR BLEEDING  TENDERNESS IN MOUTH AND THROAT WITH OR WITHOUT PRESENCE OF ULCERS  *URINARY PROBLEMS  *BOWEL PROBLEMS  UNUSUAL RASH Items with * indicate a potential emergency and should be followed up as soon as possible.  Feel free to call the clinic you have any questions or concerns. The clinic phone number is [phone].  Please show the [facility] at check-in to the Emergency Department and triage nurse.

## 2014-10-20 NOTE — Progress Notes (Signed)
Nutrition follow-up completed with patient during infusion for stage IV lung cancer. Weight has decreased and was documented as 184.1 pounds, May 9 decreased from 187 pounds April 18. Patient's poor appetite and taste alterations continue. Patient also has continued constipation.  However, reports he has been given additional medication today. Patient is only drinking one Carn[employer] breakfast daily.  Patient meets criteria for severe malnutrition in the context of chronic illness secondary to less than 75% energy intake for greater than one month and 12% weight loss over 5 months.  Patient also positive for depletion of both fat and muscle stores on physical exam.  Nutrition diagnosis:  Food and nutrition related knowledge deficit continues.  Intervention:  I encouraged patient to add 4 ounces prune juice on a daily basis and continue medications as recommended by physician. Recommended patient increase [employer] Breakfast 3 times a day as well as soft moist high-calorie high-protein foods. Provided additional coupons for [employer]. Questions were answered.  Teach back method was used.  Monitoring, evaluation, goals: Patient has continued weight loss.  He will work to increase [employer] 3 times a day.  Next visit: Tuesday, May 31 during chemotherapy.  **Disclaimer: This note was dictated with voice recognition software. Similar sounding words can inadvertently be transcribed and this note may contain transcription errors which may not have been corrected upon publication of note.**

## 2014-10-24 ENCOUNTER — Encounter: Payer: Self-pay | Admitting: Gastroenterology

## 2014-10-27 ENCOUNTER — Telehealth: Payer: Self-pay | Admitting: *Deleted

## 2014-10-27 NOTE — Telephone Encounter (Signed)
TC from pt stating he is very constipated. He took an Ex-lax and a suppository with just minimal results. He states the stool is very hard and he had a little bleeding with last BM. Instructed pt to get OTC glycerin suppositories and Sennakot S. Instructed him to use both tonight (2 sennakot s tabs, and a suppository) and if no results to take 2 more Sennakot S in the morning. If no relief to call us back. He voiced understanding.

## 2014-10-28 ENCOUNTER — Telehealth: Payer: Self-pay | Admitting: Medical Oncology

## 2014-10-28 ENCOUNTER — Encounter: Payer: Self-pay | Admitting: Physician Assistant

## 2014-10-28 ENCOUNTER — Ambulatory Visit (INDEPENDENT_AMBULATORY_CARE_PROVIDER_SITE_OTHER): Payer: Medicare Other | Admitting: Physician Assistant

## 2014-10-28 VITALS — BP 108/60 | HR 64 | Ht 71.0 in | Wt 186.0 lb

## 2014-10-28 DIAGNOSIS — K5900 Constipation, unspecified: Secondary | ICD-10-CM | POA: Diagnosis not present

## 2014-10-28 DIAGNOSIS — B370 Candidal stomatitis: Secondary | ICD-10-CM

## 2014-10-28 DIAGNOSIS — R1314 Dysphagia, pharyngoesophageal phase: Secondary | ICD-10-CM

## 2014-10-28 DIAGNOSIS — R634 Abnormal weight loss: Secondary | ICD-10-CM

## 2014-10-28 DIAGNOSIS — K648 Other hemorrhoids: Secondary | ICD-10-CM

## 2014-10-28 MED ORDER — HYDROCORTISONE ACETATE 25 MG RE SUPP: RECTAL | Status: DC

## 2014-10-28 MED ORDER — POLYETHYLENE GLYCOL 3350 17 GM/SCOOP PO POWD: 1.0000 | Freq: Every day | ORAL | Status: DC

## 2014-10-28 MED ORDER — NYSTATIN 100000 UNIT/ML MT SUSP
5.0000 mL | Freq: Four times a day (QID) | OROMUCOSAL | Status: DC
Start: 2014-10-28 — End: 2014-11-28

## 2014-10-28 NOTE — Patient Instructions (Addendum)
Take Miralax, 17 grams in 8 oz of water, do 5 doses in one day then go to 2 doses every day.  We sent a prescriptions to [facility] [address] . 1. Generic Miralax 2. Anusol HC Suppositories Use 1 suppository at bedtime for 14 days . Then as needed. 3. Mycostatin liquid to swish and swallow.   Mycostatin swish and swallow:  Take 5 ML (cc's) 4 times daily for 14 days.  Barium swallow test is scheduled at [facility]. Go to patient registration.  Date: 11-03-2014 Monday Time: 10 Am, arrival time is 9:45 am. Have nothing to eat or drink after 7:00 am    .Barium Swallow A barium swallow is an X-ray examination. It evaluates only the area at the back of the throat (pharynx) and the "food pipe" that leads to the stomach (esophagus). For this study, the patient swallows a white chalky liquid called "barium". Barium blocks standard X-ray beams, It looks much different on X-ray film when compared to nearby organs. Because of the way barium works, it is often called a "contrast material". It is an easy, fast, and safe method for getting X-ray pictures that can identify possible problems. A barium swallow helps in the evaluation of some digestive functions and to detect:  Ulcers.  Tumors.  Inflammation of the esophagus.  Hiatal hernias ( the upper portion of the stomach protrudes into the chest cavity through an opening of the diaphragm).  Scarring.  Blockages.  Problems with muscular wall of the pharynx and esophagus. The procedure is also used to help diagnose symptoms such as:  Difficulty swallowing.  Chest and abdominal pain.  Reflux (a backward flow of partially digested food and digestive juices).  Unexplained vomiting.  Severe indigestion. LET YOUR CAREGIVER KNOW ABOUT:   Allergies, especially to contrast materials.  Medications taken including herbs, eye drops, over-the-counter medications, and creams.  Use of steroids (by mouth or  creams).  Possible pregnancy, if applicable.  History of blood clots (thrombophlebitis).  History of bleeding or blood problems.  Previous surgery.  Other health problems. RISKS AND COMPLICATIONS  There is rare chance of cancer from radiation exposure. However, the benefit of an accurate diagnosis far outweighs the risk.  Some patients may be allergic to the flavoring added to some brands of barium. If you have experienced allergic reactions after eating chocolate, certain berries or citrus fruit, be sure to tell your caregiver or the technologist before the procedure.  There is a rare chance that some barium could be retained leading to a blockage of the digestive system. Patients who have an obstruction in the gastrointestinal tract should not undergo this exam.  Women should always inform their physician or X-ray technologist if there is any possibility that they are pregnant. BEFORE THE PROCEDURE  To ensure the best possible image quality, your stomach should be empty of food. You will likely be asked not to eat or drink anything and to refrain from chewing gum and smoking after midnight on the day of the examination. Your caregiver will provide specific instructions about taking or not taking regular prescription medications on the day of test. You may be asked to remove some or all of your clothes and to wear a gown during the exam. You may also be asked to remove jewelry, eye glasses, and any metal objects or clothing that might interfere with the X-ray images. PROCEDURE The patient drinks the liquid barium, which looks like a light-colored milkshake. The radiologist watches  the barium pass through the digestive tract on a fluoroscope. This is a device that projects X-ray images in a movie-like sequence onto a monitor. The exam table will be positioned at different angles, and the abdomen may be compressed to help spread the barium. Once the esophagus is coated with the barium, still  X-ray pictures are taken and stored for further review. It is important to hold still while the X-ray picture is taken. That will reduce the chances of a blurred image.  AFTER THE PROCEDURE  The barium may color your stools gray or white for 48 to 72 hours after the procedure. Sometimes the barium can cause temporary constipation. This condition is usually treated by an over-the-counter stool softener or laxative. Drinking more than normal amounts of fluids following the test can also help.  HOME CARE INSTRUCTIONS  After the examination, you can resume a regular diet and take orally administered medications unless told otherwise by your doctor.  SEEK MEDICAL CARE IF:   If you are unable to have a bowel movement.  If your bowel habits undergo any significant changes following the exam, you should contact your physician.  You experience worsening problems with swallowing, food "getting stuck", nausea, or pain. SEEK IMMEDIATE MEDICAL CARE IF:   You develop worsening abdominal pain.  You develop vomiting.  You develop any chest pain, lightheadedness, or unusual sweating.  You develop weakness or you faint. Document Released: 10/11/2006 Document Revised: 08/22/2011 Document Reviewed: 12/11/2006 [facility] Patient Information 2015 [place], [place]. This information is not intended to replace advice given to you by your health care provider. Make sure you discuss any questions you have with your health care provider.

## 2014-10-28 NOTE — Progress Notes (Signed)
Patient ID: Nicholas Schmitt, male   DOB: 1936-03-06, 79 y.o.   MRN: [id number]   Subjective:    Patient ID: Nicholas Schmitt, male    DOB: 10/01/1935, 79 y.o.   MRN: [id number]  HPI Nicholas Schmitt is a pleasant 79 year old white male known previously to Dr. Carlean Schmitt. He is currently being referred by Dr. Mohammed/oncology for evaluation of dysphagia Is also complaining of hemorrhoidal bleeding and constipation. Patient has history of stage IV non-small cell cell lung cancer. He had been on chemotherapy which was stopped in December 2015 due to her to disease progression and is now being treated with immunotherapy with [facility] and had undergone palliative radiation to his chest in January 2016. Exline He last had CT in April 2016 of the chest abdomen and pelvis which showed response to therapy of right-sided pulmonary nodules and thoracic adenopathy, small right effusion new persistent mass effect upon the SVC and a new right fifth rib bone metastasis. There was decrease in right renal metastasis increase in bone metastases in similar aortic dilation. Patient says he has been having difficulty swallowing ever since he had radiation, and also says that he just has no appetite and food does not taste good. He had undergone an ENT evaluation with no specific findings other than GERD. He denies any difficulty swallowing liquids but says he is uncomfortable after swallowing any solids. He has lost about 36 pounds. He is able to swallow his food but feels that it hangs in his upper esophageal area. He is not regurgitating. Exline He denies any abdominal pain but says he has been having difficulty with "bad" constipation to the point of having to manually disimpact himself. He says when he is very constipated his hemorrhoids will flareup and he may see some bleeding. Last colonoscopy was done April 2009 showing sigmoid diverticulosis and external hemorrhoids.  Review of Systems Pertinent positive and negative review of  systems were noted in the above HPI section.  All other review of systems was otherwise negative.  Outpatient Encounter Prescriptions as of 10/28/2014  Medication Sig  . acetaminophen (TYLENOL) 500 MG tablet Take 500 mg by mouth every 6 (six) hours as needed. 2 tabs  . albuterol (PROVENTIL,VENTOLIN) 90 MCG/ACT inhaler Inhale 2 puffs into the lungs every 6 (six) hours as needed for wheezing or shortness of breath.  . ALPRAZolam (XANAX) 0.25 MG tablet Take 1 tablet (0.25 mg total) by mouth at bedtime as needed for anxiety.  . budesonide-formoterol (SYMBICORT) 160-4.5 MCG/ACT inhaler Inhale 2 puffs into the lungs 2 (two) times daily.  . fluticasone (FLONASE) 50 MCG/ACT nasal spray   . hydrocortisone (ANUSOL-HC) 25 MG suppository Use 1 suppository at bedtime for 14 nights then as needed.  . Multiple Vitamin (MULTIVITAMIN) tablet Take 1 tablet by mouth daily.   . naproxen sodium (ANAPROX) 220 MG tablet Take 440 mg by mouth 2 (two) times daily as needed (pain).   . nitroGLYCERIN (NITROSTAT) 0.4 MG SL tablet Place 0.4 mg under the tongue every 5 (five) minutes as needed for chest pain (MAX 3 TABLETS).  [name] nystatin (MYCOSTATIN) 100000 UNIT/ML suspension Take 5 mLs (500,000 Units total) by mouth 4 (four) times daily.  [name] omeprazole (PRILOSEC) 40 MG capsule   . oxyCODONE (OXY IR/ROXICODONE) 5 MG immediate release tablet Take 1-2 tablets (5-10 mg total) by mouth every 4 (four) hours as needed for moderate pain. (Patient not taking: Reported on 10/20/2014)  . polyethylene glycol powder (GLYCOLAX/MIRALAX) powder Take 255 g by mouth daily.  [name]  prochlorperazine (COMPAZINE) 10 MG tablet Take 1 tablet (10 mg total) by mouth every 6 (six) hours as needed for nausea or vomiting. (Patient not taking: Reported on 10/13/2014)  . tamsulosin (FLOMAX) 0.4 MG CAPS capsule   . traMADol (ULTRAM) 50 MG tablet Take 1 tablet (50 mg total) by mouth every 6 (six) hours as needed for moderate pain. (Patient not taking: Reported on  10/20/2014)  . [DISCONTINUED] predniSONE (STERAPRED UNI-PAK 21 TAB) 10 MG (21) TBPK tablet   . [DISCONTINUED] sucralfate (CARAFATE) 1 G tablet Take 1 g by mouth 4 (four) times daily -  with meals and at bedtime. Dissolve tablets in 15-30 ml of water before taking.  Disp. 42 tablets. 0 refills. Called in [facility]ill to [facility] per Dr. Sondra Come on 09/02/14.   Facility-Administered Encounter Medications as of 10/28/2014  Medication  . heparin lock flush 100 unit/mL  . sodium chloride 0.9 % injection 10 mL  . sodium chloride 0.9 % injection 3 mL   No Known Allergies Patient Active Problem List   Diagnosis Date Noted  . Other pancytopenia   . Acute respiratory failure with hypoxia   . Antineoplastic chemotherapy induced pancytopenia   . SOB (shortness of breath) 07/21/2014  . CAP (community acquired pneumonia) 07/21/2014  . Neoplasm related pain 06/18/2014  . Anemia in neoplastic disease 06/04/2014  . Dyspnea 06/02/2014  . Acute right hip pain 05/26/2014  . Non-small cell carcinoma of lung, stage 4 01/30/2014  . Nodule of right lung 01/21/2014  . Mediastinal adenopathy 01/21/2014  . Prostate cancer   . History of radiation therapy   . 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 Sick sinus syndrome 04/16/2013  . Cardiac arrhythmia 07/12/2011  . COPD GOLD II 08/27/2010  . Abdominal aortic aneurysm 05/14/2008  . ACTINIC KERATOSIS, HEAD 02/11/2008  . LOSS, CONDUCTIVE HEARING, BILATERAL 01/18/2007  . HLD (hyperlipidemia) 12/25/2006  . Essential hypertension 12/25/2006  . Coronary atherosclerosis 12/25/2006   History   Social History  . Marital Status: Divorced    Spouse Name: N/A  . Number of Children: 3  . Years of Education: N/A   Occupational History  . Retired    Social History Main Topics  . Smoking status: Former Smoker -- 3.00 packs/day for 35 years    Types: Cigarettes    Quit date: 07/30/1993  . Smokeless tobacco: Never Used  . Alcohol Use: No      Comment: hx alcohol abuse per note dated 04/11/11  . Drug Use: No  . Sexual Activity: Not on file   Other Topics Concern  . Not on file   Social History Narrative    Mr. Burkhalter's family history includes Asthma in his brother and sister; Coronary artery disease in his other.      Objective:    Filed Vitals:   10/28/14 0944  BP: 108/60  Pulse: 64    Physical Exam   well-developed elderly white male in no acute distress, pleasant mildly dyspneic with exertion blood pressure 108/60 pulse 64 height 5 foot 11 weight 186. HEENT; nontraumatic normocephalic EOMI PERRLA sclera anicteric tongue with yellowish brown coating, Neck supple no palpable adenopathy no JVD, Cardiovascular; regular rate and rhythm with S1-S2 no murmur rub or gallop, Pulmonary ;decreased breath sounds bilaterally scattered rhonchi, Abdomen; soft nontender nondistended bowel sounds are active there is no palpable mass or hepatosplenomegaly, Rectal; exam no external hemorrhoids noted nontender to digital exam stool is dark and Hemoccult positive, no palpable mass, Extremities; no  clubbing cyanosis or edema skin warm and dry, Psych.; mood and affect appropriate       Assessment & Plan:   #1 79 yo male with  Solid food dysphagia , lack of appetite , weight loss and altered taste sensation since undergoing palliative radiation for stage IV nonsmall cell lung CA in Jan 2016. R/o radiation induced stricture  Suspect altered taste secondary to radiation and chemo- may have oral candidiasis  #2 Chronic Constipation #4 rectal bleeding and discomfort -secondary to internal hemorrhoids #5 COPD- has O2 at home -does not use regularly #6 hx prostate CA #7CAD #8 HTN #9 SSS- s/p pacemaker   Plan; Will schedule for barium swallow initially as increased risk for complications with sedation. If stricture found then will need EGD/DIL with Dr Nicholas Schmitt -probably at hospital. Start mycostatin swish and swallow 5 cc 4 x daily x 2  weeks Start  Anusol Hc supp qhs x 2 weeks then as needed Bowel purge  with 5-6 doses of Miralax -then start BID 17 gm of Miralax in 8 oz of water daily        Mary-Anne Polizzi S Johnthomas Lader PA-C 10/28/2014   Cc: Dorena Cookey, MD

## 2014-10-28 NOTE — Telephone Encounter (Signed)
Pt called to report that he is having constipation "ever since starting chemo" Beryle Flock). He had a small hard stool yesterday with some blood seen in the commode. He reports he has hemorrhoids. He took xlax, sennakot last night and today.  He is seeing Gi today and i instructed him to report these symptoms to his GI MD. Note routed to Dr Trellis Paganini.

## 2014-11-03 ENCOUNTER — Ambulatory Visit (HOSPITAL_COMMUNITY)
Admission: RE | Admit: 2014-11-03 | Discharge: 2014-11-03 | Disposition: A | Discharge status: Home / Self Care | Payer: Medicare Other | Source: Ambulatory Visit | Attending: Physician Assistant | Admitting: Physician Assistant

## 2014-11-03 ENCOUNTER — Telehealth: Payer: Self-pay | Admitting: Internal Medicine

## 2014-11-03 DIAGNOSIS — R1310 Dysphagia, unspecified: Secondary | ICD-10-CM | POA: Diagnosis not present

## 2014-11-03 DIAGNOSIS — B370 Candidal stomatitis: Secondary | ICD-10-CM

## 2014-11-03 DIAGNOSIS — R1314 Dysphagia, pharyngoesophageal phase: Secondary | ICD-10-CM

## 2014-11-03 NOTE — Telephone Encounter (Signed)
pt wife called to confirm appt....pt ok and aware

## 2014-11-04 ENCOUNTER — Telehealth: Payer: Self-pay | Admitting: Physician Assistant

## 2014-11-04 NOTE — Telephone Encounter (Signed)
Spoke with patient about his Barium swallow results. Also explained how to take Omeprazole. Encouraged him to call back if does not see improvement with his "acid indigestion" after a week. He expresses understanding.

## 2014-11-11 ENCOUNTER — Ambulatory Visit (HOSPITAL_BASED_OUTPATIENT_CLINIC_OR_DEPARTMENT_OTHER): Payer: Medicare Other

## 2014-11-11 ENCOUNTER — Encounter: Payer: Self-pay | Admitting: Physician Assistant

## 2014-11-11 ENCOUNTER — Other Ambulatory Visit: Payer: Medicare Other

## 2014-11-11 ENCOUNTER — Ambulatory Visit (HOSPITAL_BASED_OUTPATIENT_CLINIC_OR_DEPARTMENT_OTHER): Payer: Medicare Other | Admitting: Physician Assistant

## 2014-11-11 ENCOUNTER — Telehealth: Payer: Self-pay | Admitting: Internal Medicine

## 2014-11-11 ENCOUNTER — Ambulatory Visit: Payer: Medicare Other | Admitting: Nutrition

## 2014-11-11 ENCOUNTER — Other Ambulatory Visit (HOSPITAL_BASED_OUTPATIENT_CLINIC_OR_DEPARTMENT_OTHER): Payer: Medicare Other

## 2014-11-11 VITALS — BP 110/64 | HR 88 | Temp 98.0°F | Resp 18 | Ht 71.0 in | Wt 184.7 lb

## 2014-11-11 DIAGNOSIS — Z5112 Encounter for antineoplastic immunotherapy: Secondary | ICD-10-CM

## 2014-11-11 DIAGNOSIS — Z79899 Other long term (current) drug therapy: Secondary | ICD-10-CM

## 2014-11-11 DIAGNOSIS — K5900 Constipation, unspecified: Secondary | ICD-10-CM

## 2014-11-11 DIAGNOSIS — C7971 Secondary malignant neoplasm of right adrenal gland: Secondary | ICD-10-CM

## 2014-11-11 DIAGNOSIS — C3491 Malignant neoplasm of unspecified part of right bronchus or lung: Secondary | ICD-10-CM

## 2014-11-11 DIAGNOSIS — F809 Developmental disorder of speech and language, unspecified: Secondary | ICD-10-CM

## 2014-11-11 DIAGNOSIS — C3411 Malignant neoplasm of upper lobe, right bronchus or lung: Secondary | ICD-10-CM | POA: Diagnosis not present

## 2014-11-11 DIAGNOSIS — C3412 Malignant neoplasm of upper lobe, left bronchus or lung: Secondary | ICD-10-CM | POA: Diagnosis not present

## 2014-11-11 DIAGNOSIS — C3490 Malignant neoplasm of unspecified part of unspecified bronchus or lung: Secondary | ICD-10-CM

## 2014-11-11 LAB — CBC WITH DIFFERENTIAL/PLATELET
BASO%: 0.6 % (ref 0.0–2.0)
BASOS ABS: 0 10*3/uL (ref 0.0–0.1)
EOS%: 10.2 % — ABNORMAL HIGH (ref 0.0–7.0)
Eosinophils Absolute: 0.3 10*3/uL (ref 0.0–0.5)
HEMATOCRIT: 35.2 % — AB (ref 38.4–49.9)
HEMOGLOBIN: 11.9 g/dL — AB (ref 13.0–17.1)
LYMPH#: 0.5 10*3/uL — AB (ref 0.9–3.3)
LYMPH%: 15.5 % (ref 14.0–49.0)
MCH: 31.7 pg (ref 27.2–33.4)
MCHC: 33.8 g/dL (ref 32.0–36.0)
MCV: 94 fL (ref 79.3–98.0)
MONO#: 0.4 10*3/uL (ref 0.1–0.9)
MONO%: 11.8 % (ref 0.0–14.0)
NEUT%: 61.9 % (ref 39.0–75.0)
NEUTROS ABS: 2 10*3/uL (ref 1.5–6.5)
Platelets: 119 10*3/uL — ABNORMAL LOW (ref 140–400)
RBC: 3.75 10*6/uL — ABNORMAL LOW (ref 4.20–5.82)
RDW: 16.4 % — ABNORMAL HIGH (ref 11.0–14.6)
WBC: 3.3 10*3/uL — ABNORMAL LOW (ref 4.0–10.3)

## 2014-11-11 LAB — COMPREHENSIVE METABOLIC PANEL (CC13)
ALT: 11 U/L (ref 0–55)
ANION GAP: 8 meq/L (ref 3–11)
AST: 19 U/L (ref 5–34)
Albumin: 3.3 g/dL — ABNORMAL LOW (ref 3.5–5.0)
Alkaline Phosphatase: 107 U/L (ref 40–150)
BUN: 12.5 mg/dL (ref 7.0–26.0)
CALCIUM: 9 mg/dL (ref 8.4–10.4)
CHLORIDE: 108 meq/L (ref 98–109)
CO2: 25 meq/L (ref 22–29)
Creatinine: 0.8 mg/dL (ref 0.7–1.3)
EGFR: 88 mL/min/{1.73_m2} — ABNORMAL LOW (ref >90)
Glucose: 128 mg/dl (ref 70–140)
Potassium: 4.1 mEq/L (ref 3.5–5.1)
Sodium: 141 mEq/L (ref 136–145)
TOTAL PROTEIN: 6 g/dL — AB (ref 6.4–8.3)
Total Bilirubin: 0.73 mg/dL (ref 0.20–1.20)

## 2014-11-11 LAB — TSH CHCC: TSH: 3.066 m[IU]/L (ref 0.320–4.118)

## 2014-11-11 MED ORDER — ALPRAZOLAM 0.25 MG PO TABS: 0.2500 mg | ORAL_TABLET | Freq: Every evening | ORAL | Status: DC | PRN

## 2014-11-11 MED ORDER — SODIUM CHLORIDE 0.9 % IV SOLN
Freq: Once | INTRAVENOUS | Status: AC
  Administered 2014-11-11: 10:00:00 via INTRAVENOUS

## 2014-11-11 MED ORDER — PROCHLORPERAZINE MALEATE 10 MG PO TABS
ORAL_TABLET | ORAL | Status: AC
  Filled 2014-11-11: qty 1

## 2014-11-11 MED ORDER — SODIUM CHLORIDE 0.9 % IV SOLN
2.0000 mg/kg | Freq: Once | INTRAVENOUS | Status: AC
  Administered 2014-11-11: 175 mg via INTRAVENOUS
  Filled 2014-11-11: qty 7

## 2014-11-11 MED ORDER — PROCHLORPERAZINE MALEATE 10 MG PO TABS
10.0000 mg | ORAL_TABLET | Freq: Once | ORAL | Status: AC
  Administered 2014-11-11: 10 mg via ORAL

## 2014-11-11 MED ORDER — LACTULOSE 10 GM/15ML PO SOLN: 10.0000 g | Freq: Two times a day (BID) | ORAL | Status: DC | PRN

## 2014-11-11 NOTE — Progress Notes (Signed)
Nutrition follow-up completed with patient during infusion for stage IV lung cancer.  Weight has increased as is documented as 185 lbs on May 31.  Patient's poor appetite and taste alterations continue. MBS completed. Pt having trouble with dry mouth.  Patient also has continued constipation. Pt's wife reports that pt was given prescription today for constipation medication.  Pt has stopped [employer] Essentials due to taste changes, but occasionally drinks Ensure or Boost supplements.  Pt meets criteria for severe malnutrition in the context of chronic illness secondary to <75% energy intake for >1 month and 12% weight loss over 5 months. Patient also positive for depletion of both fat and muscle stores on physical exam.   Nutrition diagnosis: Food and nutrition related knowledge deficit continues.  Intervention: Continue constipation medications as recommended by physician.  Recommended pt increase nutritional supplements to 2-3 daily (change to Ensure Complete or Enlive and Boost Plus vs regular strength) Continue soft, moist foods and liquids with meals. Provided handout and education on dry mouth/taste changes.  Provided coupons for Ensure and Boost supplements.  Questions were answered. Teach-back method used.   Monitoring, evaluation, goals: Patient will work to increase intake of nutritional supplements to 2-3 times daily.   Laurette Schimke MS, RD, LDN

## 2014-11-11 NOTE — Telephone Encounter (Signed)
Appointments complete per 5/31 pof and patient to get avs report/appointments in inf area. [facility] will call with ct appointment.

## 2014-11-11 NOTE — Progress Notes (Addendum)
[place] [phone]   [phone]  OFFICE PROGRESS NOTE  [name],[name] [name], MD [place] [address]  DIAGNOSIS: Non-small cell carcinoma of lung, stage 4  Primary site: Lung (Right)  Staging method: AJCC 7th Edition  Clinical: Stage IV (T1a, N3, M1b) signed by Curt Bears, MD on 02/01/2014 1:42 PM  Summary: Stage IV (T1a, N3, M1b) Prostate cancer  Primary site: Prostate  Clinical: Stage I (T1c, NX, M0) signed by Wyatt Portela, MD on 08/06/2013 1:59 PM  Summary: Stage I (T1c, NX, M0)  1. PRIOR THERAPY: Systemic chemotherapy with carboplatin for an AUC of 5 and Alimta 500 mg per meter squared given every 3 weeks. Status post 6 cycles, last dose was given 05/20/2014 discontinued secondary to disease progression. 2. Status post palliative radiotherapy to the right upper chest lesion in addition to the lumbar spine under the care of Dr. Sondra Come.  CURRENT THERAPY: Immunotherapy with Keytruda (pembrolizumab) 2 MG/KG every 3 weeks. First dose 07/14/2014. He has positive PDL 1 expression (90%). Status post 5 cycles.  INTERVAL HISTORY: Nicholas Schmitt 79 y.o. male returns to the clinic today for follow-up visit accompanied by his wife. He is now being treated with immunotherapy with [facility], now status post 5 cycles. That he has begun "dropping things" and his wife notes that he has been mixing up his words or they come out are all garbled. Initially it was just episodic but otherwise wife states that it has become more consistent and persistent. He denies any focal weakness. He denied having any  nausea or vomiting. He continues to complain of difficulty swallowing after completing radiation therapy. He was seen by an ENT specialist who, per the patient, put him on Prilosec. He  was evaluated by a gastroenterologist on 10/28/2014 regarding these symptoms and subsequently had a barium swallow test that was negative for  any masses or strictures.  He continues to complain of having decreased appetite.  He has lost an additional pound and a half since his last office visit.  He reports improved stamina with walking and feels that he is breathing better. The patient denied having any significant chest pain,  cough or hemoptysis. He denied diarrhea, increased shortness of breath or skin rash. He does report constipation which  has been a long-standing issue and predates treatment with Keytruda. This continues to be a problem despite daily MiralLax. He requests a refill for his Xanax tablets. He presents to proceed with cycle #6.   MEDICAL HISTORY: Past Medical History  Diagnosis Date  . Hypertension   . CAD (coronary artery disease)     positive stress test in 2006 led to left heart cath (8/06) showing 95% prox RCA, 90% CFX, and 90% mLAD.  patient had a cypher DES to all 3 lesions  . Hyperlipidemia   . Hearing loss   . AAA (abdominal aortic aneurysm) 11/09    3.2 cm   . Chronic renal insufficiency   . History of radiation therapy 03/08/04- 04/08/04    prostate 4600 cGy in 3 fractions, radioactive seed implant 05/04/04  . Myocardial infarction 1995  . Pacemaker 2014  . Dysrhythmia   . Shortness of breath     exertion  . Asthma     bronchial  . COPD (chronic obstructive pulmonary disease)   . Cancer 2015    prostate   . Prostate cancer 2005    gleason 7  . Skin cancer  ear  . Lung cancer   . Radiation Jan.11-Jan 29/2016    Right central chest 35 gray in 14 fractions  . Radiation Jan.2016    35 gray in 14 fx lower lumbar/upper sacrum  . Diverticulosis   . Hemorrhoids     ALLERGIES:  has No Known Allergies.  MEDICATIONS:  Current Outpatient Prescriptions  Medication Sig Dispense Refill  . acetaminophen (TYLENOL) 500 MG tablet Take 500 mg by mouth every 6 (six) hours as needed. 2 tabs    . albuterol (PROVENTIL,VENTOLIN) 90 MCG/ACT inhaler Inhale 2 puffs into the lungs every 6 (six) hours as  needed for wheezing or shortness of breath.    . ALPRAZolam (XANAX) 0.25 MG tablet Take 1 tablet (0.25 mg total) by mouth at bedtime as needed for anxiety. 30 tablet 0  . budesonide-formoterol (SYMBICORT) 160-4.5 MCG/ACT inhaler Inhale 2 puffs into the lungs 2 (two) times daily.    . fluticasone (FLONASE) 50 MCG/ACT nasal spray   5  . hydrocortisone (ANUSOL-HC) 25 MG suppository Use 1 suppository at bedtime for 14 nights then as needed. 14 suppository 3  . Multiple Vitamin (MULTIVITAMIN) tablet Take 1 tablet by mouth daily.     . naproxen sodium (ANAPROX) 220 MG tablet Take 440 mg by mouth 2 (two) times daily as needed (pain).     . nitroGLYCERIN (NITROSTAT) 0.4 MG SL tablet Place 0.4 mg under the tongue every 5 (five) minutes as needed for chest pain (MAX 3 TABLETS).    [name] nystatin (MYCOSTATIN) 100000 UNIT/ML suspension Take 5 mLs (500,000 Units total) by mouth 4 (four) times daily. 240 mL 1  . omeprazole (PRILOSEC) 40 MG capsule   0  . polyethylene glycol powder (GLYCOLAX/MIRALAX) powder Take 255 g by mouth daily. 255 g 1  . prochlorperazine (COMPAZINE) 10 MG tablet Take 1 tablet (10 mg total) by mouth every 6 (six) hours as needed for nausea or vomiting. 30 tablet 0  . tamsulosin (FLOMAX) 0.4 MG CAPS capsule   2  . traMADol (ULTRAM) 50 MG tablet Take 1 tablet (50 mg total) by mouth every 6 (six) hours as needed for moderate pain. 30 tablet 0  . lactulose (CHRONULAC) 10 GM/15ML solution Take 15 mLs (10 g total) by mouth 2 (two) times daily as needed for mild constipation. 473 mL 1  . oxyCODONE (OXY IR/ROXICODONE) 5 MG immediate release tablet Take 1-2 tablets (5-10 mg total) by mouth every 4 (four) hours as needed for moderate pain. (Patient not taking: Reported on 11/11/2014) 30 tablet 0   No current facility-administered medications for this visit.   Facility-Administered Medications Ordered in Other Visits  Medication Dose Route Frequency Provider Last Rate Last Dose  . heparin lock flush 100  unit/mL  500 Units Intracatheter Daily PRN Jerred Zaremba E Arta Stump, PA-C      . pembrolizumab [facility]) 175 mg in sodium chloride 0.9 % 50 mL chemo infusion  2 mg/kg (Treatment Plan Actual) Intravenous Once Curt Bears, MD 114 mL/hr at 11/11/14 1050 175 mg at 11/11/14 1050  . sodium chloride 0.9 % injection 10 mL  10 mL Intracatheter PRN Sukhman Kocher E Kemon Devincenzi, PA-C      . sodium chloride 0.9 % injection 3 mL  3 mL Intracatheter PRN Carlton Adam, PA-C        SURGICAL HISTORY:  Past Surgical History  Procedure Laterality Date  . Ptca  2006  . Coronary stent placement    . Permanent pacemaker insertion Bilateral 02/26/13    MDT Adapta L  implanted by Dr Rayann Heman for sick sinus syndrome  . Radioactive seed implant  05/04/2004    8000 cGy, Dr Danny Lawless Dr Reece Agar  . Elbow surgery Bilateral 1973  . Insert / replace / remove pacemaker    . Coronary angioplasty    . Mediastinoscopy N/A 01/28/2014    Procedure: MEDIASTINOSCOPY;  Surgeon: Melrose Nakayama, MD;  Location: [place];  Service: Thoracic;  Laterality: N/A;  . Permanent pacemaker insertion N/A 02/26/2013    Procedure: PERMANENT PACEMAKER INSERTION;  Surgeon: [name], MD;  Location: [place] CATH LAB;  Service: Cardiovascular;  Laterality: N/A;    REVIEW OF SYSTEMS:  Constitutional: positive for fatigue and weight loss Eyes: negative Ears, nose, mouth, throat, and face: negative Respiratory: positive for dyspnea on exertion and improved Cardiovascular: negative Gastrointestinal: positive for constipation Genitourinary:negative Integument/breast: negative Hematologic/lymphatic: negative Musculoskeletal:negative Neurological: positive for coordination problems, speech problems and Dropping things such as spoons or bottle/can of soda etc. Behavioral/Psych: positive for anxiety Endocrine: negative Allergic/Immunologic: negative   PHYSICAL EXAMINATION: General appearance: alert, cooperative, fatigued, no distress and Speech periodically  garbled/unintelligible with clear effort on the patient's part to try to speak clearly Head: Normocephalic, without obvious abnormality, atraumatic Neck: no adenopathy, no carotid bruit, supple, symmetrical, trachea midline and thyroid not enlarged, symmetric, no tenderness/mass/nodules Lymph nodes: Cervical, supraclavicular, and axillary nodes normal. Resp: clear to auscultation bilaterally Back: symmetric, no curvature. ROM normal. No CVA tenderness. Cardio: regular rate and rhythm, S1, S2 normal, no murmur, click, rub or gallop GI: soft, non-tender; bowel sounds normal; no masses,  no organomegaly Extremities: extremities normal, atraumatic, no cyanosis or edema Neurologic: Grossly normal speech periodically garbled but then able to speak clearly Skin: no skin lesions or rashes  ECOG PERFORMANCE STATUS: 1 - Symptomatic but completely ambulatory  Blood pressure 110/64, pulse 88, temperature 98 F (36.7 C), temperature source Oral, resp. rate 18, height '5\' 11"'$  (1.803 m), weight 184 lb 11.2 oz (83.779 kg), SpO2 93 %.  LABORATORY DATA: Lab Results  Component Value Date   WBC 3.3* 11/11/2014   HGB 11.9* 11/11/2014   HCT 35.2* 11/11/2014   MCV 94.0 11/11/2014   PLT 119* 11/11/2014      Chemistry      Component Value Date/Time   NA 141 11/11/2014 0858   NA 139 07/24/2014 0436   K 4.1 11/11/2014 0858   K 4.1 07/24/2014 0436   CL 102 07/24/2014 0436   CO2 25 11/11/2014 0858   CO2 26 07/24/2014 0436   BUN 12.5 11/11/2014 0858   BUN 16 07/24/2014 0436   CREATININE 0.8 11/11/2014 0858   CREATININE 0.74 07/24/2014 0436      Component Value Date/Time   CALCIUM 9.0 11/11/2014 0858   CALCIUM 9.3 07/24/2014 0436   ALKPHOS 107 11/11/2014 0858   ALKPHOS 66 07/21/2014 1821   AST 19 11/11/2014 0858   AST 43* 07/21/2014 1821   ALT 11 11/11/2014 0858   ALT 37 07/21/2014 1821   BILITOT 0.73 11/11/2014 0858   BILITOT 1.2 07/21/2014 1821       RADIOGRAPHIC STUDIES: Dg  Esophagus  11/03/2014   CLINICAL DATA:  Dysphagia status post radiation therapy.  EXAM: ESOPHOGRAM / BARIUM SWALLOW / BARIUM TABLET STUDY  TECHNIQUE: Combined double contrast and single contrast examination performed using effervescent crystals, thick barium liquid, and thin barium liquid. The patient was observed with fluoroscopy swallowing a 13 mm barium sulphate tablet.  FLUOROSCOPY TIME:  Radiation Exposure Index (as provided by the fluoroscopic device):  If the device does not provide the exposure index:  Fluoroscopy Time:  dictate in minutes and seconds  Number of Acquired Images:  Fluoroscopy Time:  54 seconds.  COMPARISON:  None.  FINDINGS: The esophagus is patent. No stricture or mass identified. The barium tablet was ingested and easily passed through the esophagus and into the stomach. No hiatal hernia or reflux identified.  A few non propulsive tertiary waves noted.  IMPRESSION: 1. No evidence for stricture or mass.   Electronically Signed   By: Kerby Moors M.D.   On: 11/03/2014 11:34    ASSESSMENT AND PLAN: this is a very pleasant 79 years old white male with stage IV non-small cell lung cancer, adenocarcinoma with positive PDL 1 expression, completed systemic chemotherapy with carboplatin and Alimta status post 6 cycles and tolerated his treatment fairly well.  The  CT scan of the chest, abdomen and pelvis showed evidence for disease progression with new spiculated left upper lobe nodule in addition to increased in the size of the right adrenal gland. The patient is being treated with Keytruda at 2 mg/kg given every 3 weeks, status post 5cycles. Patient was discussed with and also seen by Dr. Julien Nordmann.  His recent CT scan showed improvement in the thoracic and right adrenal disease. There were some areas of small (6 mm) osseous metastasis noted.  He will proceed with cycle #6 of Keytruda today as scheduled. His speech abnormalities are concerning and we will further evaluate this with a CT of  the head with contrast to look for possible metastasis or evidence of CVA. We'll schedule this imaging study with thin the next few days. He will follow-up in 3 weeks prior to cycle #7 with a restaging CT scan of the chest, abdomen and pelvis with contrast to reevaluate his disease. For constipation the patient will be placed on lactulose 15 ML's by mouth twice daily as needed for constipation. Prescription for this medication was sent to his pharmacy via E scribe. Both patient and his wife voiced understanding. He was given a refill prescription for his Xanax tablets.  The patient was advised to call immediately if he has any concerning symptoms in the interval. The patient voices understanding of current disease status and treatment options and is in agreement with the current care plan.  All questions were answered. The patient knows to call the clinic with any problems, questions or concerns. We can certainly see the patient much sooner if necessary.  Carlton Adam, PA-C 11/11/2014  ADDENDUM: Hematology/Oncology Attending: I had a face to face encounter with the patient. I recommended his care plan. This is a very pleasant 79 years old white male with metastatic non-small cell lung cancer, adenocarcinoma was currently undergoing second line treatment with immunotherapy with Nivolumab status post 5 cycles. He is rating his treatment fairly well with no specific complaints except for constipation. The patient was given prescription for lactulose to help with his constipation. I recommended for him to proceed with cycle #6 today as scheduled. He would come back for follow-up visit in 3 weeks for reevaluation after repeating CT scan of the chest, abdomen and pelvis for restaging of his disease. The patient was advised to call immediately if he has any concerning symptoms in the interval.   Disclaimer: This note was dictated with voice recognition software. Similar sounding words can inadvertently  be transcribed and may be missed upon review. Eilleen Kempf., MD 11/17/2014

## 2014-11-11 NOTE — Patient Instructions (Signed)
[facility] Discharge Instructions for Patients Receiving Chemotherapy  Today you received the following chemotherapy agents Beryle Flock  To help prevent nausea and vomiting after your treatment, we encourage you to take your nausea medication if needed.   If you develop nausea and vomiting that is not controlled by your nausea medication, call the clinic.   BELOW ARE SYMPTOMS THAT SHOULD BE REPORTED IMMEDIATELY:  *FEVER GREATER THAN 100.5 F  *CHILLS WITH OR WITHOUT FEVER  NAUSEA AND VOMITING THAT IS NOT CONTROLLED WITH YOUR NAUSEA MEDICATION  *UNUSUAL SHORTNESS OF BREATH  *UNUSUAL BRUISING OR BLEEDING  TENDERNESS IN MOUTH AND THROAT WITH OR WITHOUT PRESENCE OF ULCERS  *URINARY PROBLEMS  *BOWEL PROBLEMS  UNUSUAL RASH Items with * indicate a potential emergency and should be followed up as soon as possible.  Feel free to call the clinic you have any questions or concerns. The clinic phone number is [phone].  Please show the [facility] at check-in to the Emergency Department and triage nurse.

## 2014-11-11 NOTE — Patient Instructions (Signed)
Keep the appointment for the CT of your head to further evaluate your difficulty with your speech Follow-up in 3 weeks with a restaging CT scan of your chest, abdomen and pelvis to reevaluate your disease

## 2014-11-12 ENCOUNTER — Telehealth: Payer: Self-pay | Admitting: *Deleted

## 2014-11-12 NOTE — Telephone Encounter (Signed)
Patient was here yesterday and a CT scan was ordered and he is calling wondering if it has been set up yet.  Let him know that it usually takes a couple of days to get it authorized.,but he should be hearing in the next couple of days.  He appreciated the call back.

## 2014-11-14 NOTE — Progress Notes (Signed)
Agree with Ms. Esterwood's assessment and plan. Carl E. Gessner, MD, FACG   

## 2014-11-17 ENCOUNTER — Telehealth: Payer: Self-pay | Admitting: Physician Assistant

## 2014-11-17 ENCOUNTER — Telehealth: Payer: Self-pay | Admitting: *Deleted

## 2014-11-17 ENCOUNTER — Encounter: Payer: Self-pay | Admitting: Physician Assistant

## 2014-11-17 ENCOUNTER — Other Ambulatory Visit: Payer: Self-pay | Admitting: Physician Assistant

## 2014-11-17 ENCOUNTER — Encounter (HOSPITAL_COMMUNITY): Payer: Self-pay

## 2014-11-17 ENCOUNTER — Ambulatory Visit (HOSPITAL_BASED_OUTPATIENT_CLINIC_OR_DEPARTMENT_OTHER): Payer: Medicare Other | Admitting: Physician Assistant

## 2014-11-17 ENCOUNTER — Other Ambulatory Visit: Payer: Self-pay | Admitting: *Deleted

## 2014-11-17 ENCOUNTER — Ambulatory Visit (HOSPITAL_COMMUNITY)
Admission: RE | Admit: 2014-11-17 | Discharge: 2014-11-17 | Disposition: A | Discharge status: Home / Self Care | Payer: Medicare Other | Source: Ambulatory Visit | Attending: Physician Assistant | Admitting: Physician Assistant

## 2014-11-17 VITALS — BP 121/72 | HR 86 | Temp 97.5°F | Resp 18 | Ht 71.0 in | Wt 181.2 lb

## 2014-11-17 DIAGNOSIS — C3490 Malignant neoplasm of unspecified part of unspecified bronchus or lung: Secondary | ICD-10-CM

## 2014-11-17 DIAGNOSIS — C3412 Malignant neoplasm of upper lobe, left bronchus or lung: Secondary | ICD-10-CM | POA: Diagnosis not present

## 2014-11-17 DIAGNOSIS — C7931 Secondary malignant neoplasm of brain: Secondary | ICD-10-CM | POA: Diagnosis not present

## 2014-11-17 DIAGNOSIS — R4781 Slurred speech: Secondary | ICD-10-CM | POA: Diagnosis present

## 2014-11-17 DIAGNOSIS — C7971 Secondary malignant neoplasm of right adrenal gland: Secondary | ICD-10-CM | POA: Diagnosis not present

## 2014-11-17 DIAGNOSIS — F809 Developmental disorder of speech and language, unspecified: Secondary | ICD-10-CM

## 2014-11-17 DIAGNOSIS — Z5112 Encounter for antineoplastic immunotherapy: Secondary | ICD-10-CM | POA: Insufficient documentation

## 2014-11-17 DIAGNOSIS — C3400 Malignant neoplasm of unspecified main bronchus: Secondary | ICD-10-CM

## 2014-11-17 MED ORDER — IOHEXOL 300 MG/ML  SOLN
100.0000 mL | Freq: Once | INTRAMUSCULAR | Status: AC | PRN
  Administered 2014-11-17: 100 mL via INTRAVENOUS

## 2014-11-17 MED ORDER — DEXAMETHASONE 4 MG PO TABS: 8.0000 mg | ORAL_TABLET | Freq: Two times a day (BID) | ORAL | Status: DC

## 2014-11-17 NOTE — Telephone Encounter (Signed)
CT results reviewed with Adrena, PA. Pt to be seen today at 1115am, results and treatment to be discussed at this time. POF to scheduling

## 2014-11-17 NOTE — Telephone Encounter (Signed)
Office visit with AJ/NP added per 06/06 POF, pt is aware to come after his CT test.... KJ

## 2014-11-17 NOTE — Telephone Encounter (Signed)
Patient's girlfriend Nicholas Schmitt called requesting "[facility]oid be [facility] [place] [facility] and [facility]'t [facility]."  Verbal order received, read back and sent [facility] to [facility].  Nicholas notified to pick up later today.

## 2014-11-17 NOTE — Patient Instructions (Addendum)
The CT of your head revealed a single brain metastasis Take the dexamethasone as prescribed with food Keep the appointment with Dr. Sondra Come on 11/20/2014 for further evaluation and management of your new brain metastasis. Keep the appointment for your restaging CT scan of the chest, abdomen and pelvis as previously scheduled and follow-up with Dr. Julien Nordmann also as previously scheduled.

## 2014-11-17 NOTE — Progress Notes (Signed)
[place] [phone]   [phone]  OFFICE PROGRESS NOTE  [name],[name] [name], MD [place] [address]  DIAGNOSIS: Non-small cell carcinoma of lung, stage 4  Primary site: Lung (Right)  Staging method: AJCC 7th Edition  Clinical: Stage IV (T1a, N3, M1b) signed by Curt Bears, MD on 02/01/2014 1:42 PM  Summary: Stage IV (T1a, N3, M1b) Prostate cancer  Primary site: Prostate  Clinical: Stage I (T1c, NX, M0) signed by Wyatt Portela, MD on 08/06/2013 1:59 PM  Summary: Stage I (T1c, NX, M0)  1. PRIOR THERAPY: Systemic chemotherapy with carboplatin for an AUC of 5 and Alimta 500 mg per meter squared given every 3 weeks. Status post 6 cycles, last dose was given 05/20/2014 discontinued secondary to disease progression. 2. Status post palliative radiotherapy to the right upper chest lesion in addition to the lumbar spine under the care of Dr. Sondra Come.  CURRENT THERAPY: Immunotherapy with Keytruda (pembrolizumab) 2 MG/KG every 3 weeks. First dose 07/14/2014. He has positive PDL 1 expression (90%). Status post 6 cycles.  INTERVAL HISTORY: Nicholas Schmitt 79 y.o. male returns to the clinic today for work in visit accompanied by his wife. He is now being treated with immunotherapy with [facility], now status post 6 cycles. To further evaluate his recent episodes of garbled speech and "dropping things" patient had a CT of the head with and without contrast today. Of note the patient has a pacemaker and we were unable to schedule him for brain MRI as a result. He presents to discuss the results of the CT of the head. The frequency of his garbled speech remains constant, occurring at least once a day but for very amounts of time. He voiced no other specific complaints today.   MEDICAL HISTORY: Past Medical History  Diagnosis Date  . Hypertension   . CAD (coronary artery disease)     positive stress test in 2006 led to left  heart cath (8/06) showing 95% prox RCA, 90% CFX, and 90% mLAD.  patient had a cypher DES to all 3 lesions  . Hyperlipidemia   . Hearing loss   . AAA (abdominal aortic aneurysm) 11/09    3.2 cm   . Chronic renal insufficiency   . History of radiation therapy 03/08/04- 04/08/04    prostate 4600 cGy in 3 fractions, radioactive seed implant 05/04/04  . Myocardial infarction 1995  . Pacemaker 2014  . Dysrhythmia   . Shortness of breath     exertion  . Asthma     bronchial  . COPD (chronic obstructive pulmonary disease)   . Radiation Jan.11-Jan 29/2016    Right central chest 35 gray in 14 fractions  . Radiation Jan.2016    35 gray in 14 fx lower lumbar/upper sacrum  . Diverticulosis   . Hemorrhoids   . Cancer 2015    prostate   . Prostate cancer 2005    gleason 7  . Skin cancer     ear  . Lung cancer     ALLERGIES:  has No Known Allergies.  MEDICATIONS:  Current Outpatient Prescriptions  Medication Sig Dispense Refill  . acetaminophen (TYLENOL) 500 MG tablet Take 500 mg by mouth every 6 (six) hours as needed. 2 tabs    . albuterol (PROVENTIL,VENTOLIN) 90 MCG/ACT inhaler Inhale 2 puffs into the lungs every 6 (six) hours as needed for wheezing or shortness of breath.    . ALPRAZolam (XANAX) 0.25 MG tablet Take  1 tablet (0.25 mg total) by mouth at bedtime as needed for anxiety. 30 tablet 0  . budesonide-formoterol (SYMBICORT) 160-4.5 MCG/ACT inhaler Inhale 2 puffs into the lungs 2 (two) times daily.    . fluticasone (FLONASE) 50 MCG/ACT nasal spray   5  . hydrocortisone (ANUSOL-HC) 25 MG suppository Use 1 suppository at bedtime for 14 nights then as needed. 14 suppository 3  . lactulose (CHRONULAC) 10 GM/15ML solution Take 15 mLs (10 g total) by mouth 2 (two) times daily as needed for mild constipation. 473 mL 1  . Multiple Vitamin (MULTIVITAMIN) tablet Take 1 tablet by mouth daily.     . naproxen sodium (ANAPROX) 220 MG tablet Take 440 mg by mouth 2 (two) times daily as needed  (pain).     . nitroGLYCERIN (NITROSTAT) 0.4 MG SL tablet Place 0.4 mg under the tongue every 5 (five) minutes as needed for chest pain (MAX 3 TABLETS).    [name] nystatin (MYCOSTATIN) 100000 UNIT/ML suspension Take 5 mLs (500,000 Units total) by mouth 4 (four) times daily. 240 mL 1  . omeprazole (PRILOSEC) 40 MG capsule   0  . oxyCODONE (OXY IR/ROXICODONE) 5 MG immediate release tablet Take 1-2 tablets (5-10 mg total) by mouth every 4 (four) hours as needed for moderate pain. 30 tablet 0  . polyethylene glycol powder (GLYCOLAX/MIRALAX) powder Take 255 g by mouth daily. 255 g 1  . prochlorperazine (COMPAZINE) 10 MG tablet Take 1 tablet (10 mg total) by mouth every 6 (six) hours as needed for nausea or vomiting. 30 tablet 0  . tamsulosin (FLOMAX) 0.4 MG CAPS capsule   2  . traMADol (ULTRAM) 50 MG tablet Take 1 tablet (50 mg total) by mouth every 6 (six) hours as needed for moderate pain. 30 tablet 0  . dexamethasone (DECADRON) 4 MG tablet Take 2 tablets (8 mg total) by mouth 2 (two) times daily with a meal. Or as directed 40 tablet 0   No current facility-administered medications for this visit.   Facility-Administered Medications Ordered in Other Visits  Medication Dose Route Frequency Provider Last Rate Last Dose  . heparin lock flush 100 unit/mL  500 Units Intracatheter Daily PRN Yahshua Thibault E Cameo Shewell, PA-C      . sodium chloride 0.9 % injection 10 mL  10 mL Intracatheter PRN Leana Springston E Farron Watrous, PA-C      . sodium chloride 0.9 % injection 3 mL  3 mL Intracatheter PRN Carlton Adam, PA-C        SURGICAL HISTORY:  Past Surgical History  Procedure Laterality Date  . Ptca  2006  . Coronary stent placement    . Permanent pacemaker insertion Bilateral 02/26/13    MDT Adapta L implanted by Dr Rayann Heman for sick sinus syndrome  . Radioactive seed implant  05/04/2004    8000 cGy, Dr Danny Lawless Dr Reece Agar  . Elbow surgery Bilateral 1973  . Insert / replace / remove pacemaker    . Coronary angioplasty      . Mediastinoscopy N/A 01/28/2014    Procedure: MEDIASTINOSCOPY;  Surgeon: Melrose Nakayama, MD;  Location: [facility];  Service: Thoracic;  Laterality: N/A;  . Permanent pacemaker insertion N/A 02/26/2013    Procedure: PERMANENT PACEMAKER INSERTION;  Surgeon: [name], MD;  Location: [facility] CATH LAB;  Service: Cardiovascular;  Laterality: N/A;    REVIEW OF SYSTEMS:  Constitutional: positive for fatigue and weight loss Eyes: negative Ears, nose, mouth, throat, and face: negative Respiratory: positive for dyspnea on exertion and improved Cardiovascular:  negative Gastrointestinal: positive for constipation Genitourinary:negative Integument/breast: negative Hematologic/lymphatic: negative Musculoskeletal:negative Neurological: positive for coordination problems, speech problems and Dropping things such as spoons or bottle/can of soda etc. Behavioral/Psych: positive for anxiety Endocrine: negative Allergic/Immunologic: negative   PHYSICAL EXAMINATION: General appearance: alert, cooperative, fatigued, no distress and Speech periodically garbled/unintelligible with clear effort on the patient's part to try to speak clearly Head: Normocephalic, without obvious abnormality, atraumatic Neck: no adenopathy, no carotid bruit, supple, symmetrical, trachea midline and thyroid not enlarged, symmetric, no tenderness/mass/nodules Lymph nodes: Cervical, supraclavicular, and axillary nodes normal. Resp: clear to auscultation bilaterally Back: symmetric, no curvature. ROM normal. No CVA tenderness. Cardio: regular rate and rhythm, S1, S2 normal, no murmur, click, rub or gallop GI: soft, non-tender; bowel sounds normal; no masses,  no organomegaly Extremities: extremities normal, atraumatic, no cyanosis or edema Neurologic: Grossly normal speech periodically garbled but then able to speak clearly Skin: no skin lesions or rashes  ECOG PERFORMANCE STATUS: 1 - Symptomatic but completely ambulatory  Blood  pressure 121/72, pulse 86, temperature 97.5 F (36.4 C), temperature source Oral, resp. rate 18, height '5\' 11"'$  (1.803 m), weight 181 lb 3.2 oz (82.192 kg), SpO2 97 %.  LABORATORY DATA: Lab Results  Component Value Date   WBC 3.3* 11/11/2014   HGB 11.9* 11/11/2014   HCT 35.2* 11/11/2014   MCV 94.0 11/11/2014   PLT 119* 11/11/2014      Chemistry      Component Value Date/Time   NA 141 11/11/2014 0858   NA 139 07/24/2014 0436   K 4.1 11/11/2014 0858   K 4.1 07/24/2014 0436   CL 102 07/24/2014 0436   CO2 25 11/11/2014 0858   CO2 26 07/24/2014 0436   BUN 12.5 11/11/2014 0858   BUN 16 07/24/2014 0436   CREATININE 0.8 11/11/2014 0858   CREATININE 0.74 07/24/2014 0436      Component Value Date/Time   CALCIUM 9.0 11/11/2014 0858   CALCIUM 9.3 07/24/2014 0436   ALKPHOS 107 11/11/2014 0858   ALKPHOS 66 07/21/2014 1821   AST 19 11/11/2014 0858   AST 43* 07/21/2014 1821   ALT 11 11/11/2014 0858   ALT 37 07/21/2014 1821   BILITOT 0.73 11/11/2014 0858   BILITOT 1.2 07/21/2014 1821       RADIOGRAPHIC STUDIES: Ct Head W Wo Contrast  11/17/2014   CLINICAL DATA:  79 year old male with metastatic lung cancer, chemotherapy in progress. Slurred speech and visual problems. Subsequent encounter.  EXAM: CT HEAD WITHOUT AND WITH CONTRAST  TECHNIQUE: Contiguous axial images were obtained from the base of the skull through the vertex without and with intravenous contrast  CONTRAST:  158m OMNIPAQUE IOHEXOL 300 MG/ML  SOLN  COMPARISON:  Head CT without and with contrast 01/29/2014.  FINDINGS: Stable paranasal sinuses and mastoids. Stable visualized osseous structures. No acute or suspicious calvarium lesion identified.  Visualized orbit soft tissues are within normal limits. Visualized scalp soft tissues are within normal limits.  Stable cerebral volume. New white matter hypodensity in the left hemisphere in a vasogenic edema pattern affecting the insula, operculum, and some of the deep white matter  capsules. New associated mostly rim enhancing oval lesion centered at the operculum measuring 16 x 20 mm. Mild rightward midline shift of 4 mm. Mild mass effect on the left lateral ventricle without ventriculomegaly.  Major intracranial vascular structures are enhancing. No other abnormal intracranial enhancement identified. Stable dural calcifications along the tentorium. Stable and normal gray-white matter differentiation elsewhere. No evidence of cortically based acute  infarction identified. No acute intracranial hemorrhage identified.  IMPRESSION: 1. New brain metastasis at the left operculum, 20 mm. Vasogenic edema with mild intracranial mass effect including 4 mm of rightward midline shift. 2. If valuable for treatment planning, brain MRI without and with contrast might detect additional lesions.  These results will be called to the ordering clinician or representative by the Radiology Department at the imaging location.   Electronically Signed   By: Genevie [name] M.D.   On: 11/17/2014 08:36   Dg Esophagus  11/03/2014   CLINICAL DATA:  Dysphagia status post radiation therapy.  EXAM: ESOPHOGRAM / BARIUM SWALLOW / BARIUM TABLET STUDY  TECHNIQUE: Combined double contrast and single contrast examination performed using effervescent crystals, thick barium liquid, and thin barium liquid. The patient was observed with fluoroscopy swallowing a 13 mm barium sulphate tablet.  FLUOROSCOPY TIME:  Radiation Exposure Index (as provided by the fluoroscopic device):  If the device does not provide the exposure index:  Fluoroscopy Time:  dictate in minutes and seconds  Number of Acquired Images:  Fluoroscopy Time:  54 seconds.  COMPARISON:  None.  FINDINGS: The esophagus is patent. No stricture or mass identified. The barium tablet was ingested and easily passed through the esophagus and into the stomach. No hiatal hernia or reflux identified.  A few non propulsive tertiary waves noted.  IMPRESSION: 1. No evidence for stricture  or mass.   Electronically Signed   By: Kerby Moors M.D.   On: 11/03/2014 11:34    ASSESSMENT AND PLAN: this is a very pleasant 79 years old white male with stage IV non-small cell lung cancer, adenocarcinoma with positive PDL 1 expression, completed systemic chemotherapy with carboplatin and Alimta status post 6 cycles and tolerated his treatment fairly well.  The  CT scan of the chest, abdomen and pelvis showed evidence for disease progression with new spiculated left upper lobe nodule in addition to increased in the size of the right adrenal gland. The patient is being treated with Keytruda at 2 mg/kg given every 3 weeks, status post 6 cycles. Patient was discussed with Dr. Benay Spice.  The CT of the head revealed a new brain metastasis at the left operculum 20 mm with vasogenic edema and mild intracranial mass effect including 4 mm of rightward midline shift. He will be placed on dexamethasone 8 mg by mouth twice a day per Dr. Gearldine Shown recommendations. He is also referred back to Dr. Sondra Come for possible SRS to this single lesion. A prescription for dexamethasone was sent to his pharmacy of record and Dr. Sondra Come will taper this medication.  The patient was advised to call immediately if he has any concerning symptoms in the interval. The patient voices understanding of current disease status and treatment options and is in agreement with the current care plan.  All questions were answered. The patient knows to call the clinic with any problems, questions or concerns. We can certainly see the patient much sooner if necessary.  Carlton Adam, PA-C 11/17/2014

## 2014-11-20 ENCOUNTER — Ambulatory Visit
Admission: RE | Admit: 2014-11-20 | Discharge: 2014-11-20 | Disposition: A | Discharge status: Home / Self Care | Payer: Medicare Other | Source: Ambulatory Visit | Attending: Radiation Oncology | Admitting: Radiation Oncology

## 2014-11-20 ENCOUNTER — Ambulatory Visit: Payer: Medicare Other | Admitting: Radiation Oncology

## 2014-11-20 ENCOUNTER — Ambulatory Visit: Payer: Medicare Other

## 2014-11-20 ENCOUNTER — Other Ambulatory Visit: Payer: Self-pay | Admitting: Radiation Therapy

## 2014-11-20 DIAGNOSIS — C7931 Secondary malignant neoplasm of brain: Secondary | ICD-10-CM

## 2014-11-20 DIAGNOSIS — Z85828 Personal history of other malignant neoplasm of skin: Secondary | ICD-10-CM | POA: Insufficient documentation

## 2014-11-20 DIAGNOSIS — Z87891 Personal history of nicotine dependence: Secondary | ICD-10-CM | POA: Insufficient documentation

## 2014-11-20 DIAGNOSIS — C3411 Malignant neoplasm of upper lobe, right bronchus or lung: Secondary | ICD-10-CM | POA: Insufficient documentation

## 2014-11-20 DIAGNOSIS — Z79899 Other long term (current) drug therapy: Secondary | ICD-10-CM | POA: Insufficient documentation

## 2014-11-20 DIAGNOSIS — Z923 Personal history of irradiation: Secondary | ICD-10-CM | POA: Insufficient documentation

## 2014-11-20 DIAGNOSIS — C3490 Malignant neoplasm of unspecified part of unspecified bronchus or lung: Secondary | ICD-10-CM

## 2014-11-20 DIAGNOSIS — Z8546 Personal history of malignant neoplasm of prostate: Secondary | ICD-10-CM | POA: Insufficient documentation

## 2014-11-20 DIAGNOSIS — Z510 Encounter for antineoplastic radiation therapy: Secondary | ICD-10-CM | POA: Insufficient documentation

## 2014-11-20 NOTE — Progress Notes (Signed)
Radiation Oncology         ([phone]) [phone] ________________________________  Name: Nicholas Schmitt MRN: [id number]  Date: 11/20/2014  DOB: 05/13/1936  Follow-Up Visit Note  CC: [name], MD  Raynelle Bring, MD    ICD-9-CM ICD-10-CM   1. Non-small cell carcinoma of lung, stage 4, unspecified laterality 162.9 C34.90     Diagnosis:   Stage IV non-small cell lung cancer  Interval Since Last Radiation:  4  months   Indication for treatment: Painful osseous metastasis within the lower lumbar/upper sacrum area and progressive disease in the right chest   Radiation treatment dates: January 11 through January 29  Site/dose: Right central chest 35 gray in 14 fractions, lower lumbar/upper sacrum 35 gray in 14 fractions  Narrative:  The patient  was scheduled to see me today for further evaluation. The patient presented recently with episodes of garbled speech and "dropping things". He did undergo a head CT scan which shows a solitary metastasis in the left operculum 20 mm with vasogenic edema and mild intracranial mass effect including 4 mm of rightward midline shift. He was placed on dexamethasone 8 mg by mouth twice a day per Dr. Gearldine Shown recommendations.                  ALLERGIES:  has No Known Allergies.  Meds: Current Outpatient Prescriptions  Medication Sig Dispense Refill  . acetaminophen (TYLENOL) 500 MG tablet Take 500 mg by mouth every 6 (six) hours as needed. 2 tabs    . albuterol (PROVENTIL,VENTOLIN) 90 MCG/ACT inhaler Inhale 2 puffs into the lungs every 6 (six) hours as needed for wheezing or shortness of breath.    . ALPRAZolam (XANAX) 0.25 MG tablet Take 1 tablet (0.25 mg total) by mouth at bedtime as needed for anxiety. 30 tablet 0  . budesonide-formoterol (SYMBICORT) 160-4.5 MCG/ACT inhaler Inhale 2 puffs into the lungs 2 (two) times daily.    [name] dexamethasone (DECADRON) 4 MG tablet Take 2 tablets (8 mg total) by mouth 2 (two) times daily with a meal. Or  as directed 40 tablet 0  . fluticasone (FLONASE) 50 MCG/ACT nasal spray   5  . hydrocortisone (ANUSOL-HC) 25 MG suppository Use 1 suppository at bedtime for 14 nights then as needed. 14 suppository 3  . lactulose (CHRONULAC) 10 GM/15ML solution Take 15 mLs (10 g total) by mouth 2 (two) times daily as needed for mild constipation. 473 mL 1  . Multiple Vitamin (MULTIVITAMIN) tablet Take 1 tablet by mouth daily.     . naproxen sodium (ANAPROX) 220 MG tablet Take 440 mg by mouth 2 (two) times daily as needed (pain).     . nitroGLYCERIN (NITROSTAT) 0.4 MG SL tablet Place 0.4 mg under the tongue every 5 (five) minutes as needed for chest pain (MAX 3 TABLETS).    [name] nystatin (MYCOSTATIN) 100000 UNIT/ML suspension Take 5 mLs (500,000 Units total) by mouth 4 (four) times daily. 240 mL 1  . omeprazole (PRILOSEC) 40 MG capsule   0  . oxyCODONE (OXY IR/ROXICODONE) 5 MG immediate release tablet Take 1-2 tablets (5-10 mg total) by mouth every 4 (four) hours as needed for moderate pain. 30 tablet 0  . polyethylene glycol powder (GLYCOLAX/MIRALAX) powder Take 255 g by mouth daily. 255 g 1  . prochlorperazine (COMPAZINE) 10 MG tablet Take 1 tablet (10 mg total) by mouth every 6 (six) hours as needed for nausea or vomiting. 30 tablet 0  . tamsulosin (FLOMAX) 0.4 MG CAPS capsule  2  . traMADol (ULTRAM) 50 MG tablet Take 1 tablet (50 mg total) by mouth every 6 (six) hours as needed for moderate pain. 30 tablet 0   No current facility-administered medications for this encounter.   Facility-Administered Medications Ordered in Other Encounters  Medication Dose Route Frequency Provider Last Rate Last Dose  . heparin lock flush 100 unit/mL  500 Units Intracatheter Daily PRN Adrena E Johnson, PA-C      . sodium chloride 0.9 % injection 10 mL  10 mL Intracatheter PRN Adrena E Johnson, PA-C      . sodium chloride 0.9 % injection 3 mL  3 mL Intracatheter PRN Carlton Adam, PA-C           Lab Findings: Lab Results    Component Value Date   WBC 3.3* 11/11/2014   HGB 11.9* 11/11/2014   HCT 35.2* 11/11/2014   MCV 94.0 11/11/2014   PLT 119* 11/11/2014    Radiographic Findings: Ct Head W Wo Contrast  11/17/2014   CLINICAL DATA:  79 year old male with metastatic lung cancer, chemotherapy in progress. Slurred speech and visual problems. Subsequent encounter.  EXAM: CT HEAD WITHOUT AND WITH CONTRAST  TECHNIQUE: Contiguous axial images were obtained from the base of the skull through the vertex without and with intravenous contrast  CONTRAST:  138m OMNIPAQUE IOHEXOL 300 MG/ML  SOLN  COMPARISON:  Head CT without and with contrast 01/29/2014.  FINDINGS: Stable paranasal sinuses and mastoids. Stable visualized osseous structures. No acute or suspicious calvarium lesion identified.  Visualized orbit soft tissues are within normal limits. Visualized scalp soft tissues are within normal limits.  Stable cerebral volume. New white matter hypodensity in the left hemisphere in a vasogenic edema pattern affecting the insula, operculum, and some of the deep white matter capsules. New associated mostly rim enhancing oval lesion centered at the operculum measuring 16 x 20 mm. Mild rightward midline shift of 4 mm. Mild mass effect on the left lateral ventricle without ventriculomegaly.  Major intracranial vascular structures are enhancing. No other abnormal intracranial enhancement identified. Stable dural calcifications along the tentorium. Stable and normal gray-white matter differentiation elsewhere. No evidence of cortically based acute infarction identified. No acute intracranial hemorrhage identified.  IMPRESSION: 1. New brain metastasis at the left operculum, 20 mm. Vasogenic edema with mild intracranial mass effect including 4 mm of rightward midline shift. 2. If valuable for treatment planning, brain MRI without and with contrast might detect additional lesions.  These results will be called to the ordering clinician or  representative by the Radiology Department at the imaging location.   Electronically Signed   By: HGenevie AnnM.D.   On: 11/17/2014 08:36   Dg Esophagus  11/03/2014   CLINICAL DATA:  Dysphagia status post radiation therapy.  EXAM: ESOPHOGRAM / BARIUM SWALLOW / BARIUM TABLET STUDY  TECHNIQUE: Combined double contrast and single contrast examination performed using effervescent crystals, thick barium liquid, and thin barium liquid. The patient was observed with fluoroscopy swallowing a 13 mm barium sulphate tablet.  FLUOROSCOPY TIME:  Radiation Exposure Index (as provided by the fluoroscopic device):  If the device does not provide the exposure index:  Fluoroscopy Time:  dictate in minutes and seconds  Number of Acquired Images:  Fluoroscopy Time:  54 seconds.  COMPARISON:  None.  FINDINGS: The esophagus is patent. No stricture or mass identified. The barium tablet was ingested and easily passed through the esophagus and into the stomach. No hiatal hernia or reflux identified.  A few non  propulsive tertiary waves noted.  IMPRESSION: 1. No evidence for stricture or mass.   Electronically Signed   By: Kerby Moors M.D.   On: 11/03/2014 11:34    Impression:  Solitary brain lesion on contrast enhanced head CT scan.   Plan:  The patient may be a candidate for [facility] for this single lesion. Patient will be scheduled for 3T MRI and evaluation with Dr. Tammi Klippel for consideration for [facility].  ____________________________________ Gery Pray, MD

## 2014-11-21 ENCOUNTER — Encounter: Payer: Self-pay | Admitting: Radiation Oncology

## 2014-11-21 ENCOUNTER — Ambulatory Visit
Admission: RE | Admit: 2014-11-21 | Discharge: 2014-11-21 | Disposition: A | Discharge status: Home / Self Care | Payer: Medicare Other | Source: Ambulatory Visit | Attending: Family Medicine | Admitting: Family Medicine

## 2014-11-21 ENCOUNTER — Ambulatory Visit
Admission: RE | Admit: 2014-11-21 | Discharge: 2014-11-21 | Disposition: A | Discharge status: Home / Self Care | Payer: Medicare Other | Source: Ambulatory Visit | Attending: Radiation Oncology | Admitting: Radiation Oncology

## 2014-11-21 VITALS — BP 137/74 | HR 68 | Temp 98.0°F | Resp 20 | Ht 72.0 in | Wt 179.5 lb

## 2014-11-21 DIAGNOSIS — Z87891 Personal history of nicotine dependence: Secondary | ICD-10-CM | POA: Diagnosis not present

## 2014-11-21 DIAGNOSIS — Z8546 Personal history of malignant neoplasm of prostate: Secondary | ICD-10-CM | POA: Diagnosis not present

## 2014-11-21 DIAGNOSIS — Z85828 Personal history of other malignant neoplasm of skin: Secondary | ICD-10-CM | POA: Diagnosis not present

## 2014-11-21 DIAGNOSIS — Z923 Personal history of irradiation: Secondary | ICD-10-CM | POA: Diagnosis not present

## 2014-11-21 DIAGNOSIS — C7931 Secondary malignant neoplasm of brain: Secondary | ICD-10-CM

## 2014-11-21 DIAGNOSIS — C3490 Malignant neoplasm of unspecified part of unspecified bronchus or lung: Secondary | ICD-10-CM

## 2014-11-21 DIAGNOSIS — C3411 Malignant neoplasm of upper lobe, right bronchus or lung: Secondary | ICD-10-CM | POA: Diagnosis not present

## 2014-11-21 DIAGNOSIS — Z510 Encounter for antineoplastic radiation therapy: Secondary | ICD-10-CM | POA: Diagnosis present

## 2014-11-21 DIAGNOSIS — Z79899 Other long term (current) drug therapy: Secondary | ICD-10-CM | POA: Diagnosis not present

## 2014-11-21 NOTE — Progress Notes (Signed)
Location/Histology of Brain Tumor: Non-small cell carcinoma of lung, stage 4, CT scan from 6/6 shows "new brain metastasis at the left operculum, 20 mm. Vasogenicedema with mild intracranial mass effect including 4 mm of rightward midline shift."  Patient presented with symptoms of: recent episodes of garbled speech and "dropping things"   Past or anticipated interventions, if any, per neurosurgery: Pre-Op SRS. Restaging scan scheduled for Tuesday 6/14 and Sim 6/16.  Past or anticipated interventions, if any, per medical oncology: Systemic chemotherapy with carboplatin for an AUC of 5 and Alimta 500 mg per meter squared given every 3 weeks. Status post 6 cycles, last dose was given 05/20/2014 discontinued secondary to disease progression. He is now being treated with immunotherapy with [facility], now status post 6 cycles.  Dose of Decadron, if applicable: 8 mg BID  Recent neurologic symptoms, if any:   Seizures: No  Headaches: occasional mild headaches  Nausea: No  Dizziness/ataxia: No  Difficulty with hand coordination: reports that he drops items often  Focal numbness/weakness: Yes especially in his legs  Visual deficits/changes: occasional floaters  Confusion/Memory deficits: decrease in short term memory  Speech not garbled but, fluid.  Painful bone metastases at present, if any: Reports intermittent low back pain  SAFETY ISSUES:  Prior radiation? Yes - Jan.11-Jan 29/2016 Right central chest 35 gray in 14 fractions, Jan.2016 - 35 gray in 14 fx lower lumbar/upper sacrum  Pacemaker/ICD? yes  Possible current pregnancy? no  Is the patient on methotrexate? no  Additional Complaints / other details: 79 year old male. Has a pacemaker. Pacemaker form complete and in MEDIA tab from prior chest radiation treatments.   Patient reports productive cough with white to yellow sputum. Denies hemoptysis. Reports SOB with mild exertion. Reports he is unable to eat much because his  "throat is raw from those treatments." Reports he has lost 40 lb in the last six months. There is not a white coating on the patient's tongue to indicate thrush.

## 2014-11-21 NOTE — Progress Notes (Signed)
See progress note under physician encounter. 

## 2014-11-21 NOTE — Progress Notes (Signed)
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 Brain metastasis 198.3 C79.31   2. Non-small cell carcinoma of lung, stage 4, unspecified laterality 162.9 C34.90     HISTORY OF PRESENT ILLNESS::Nicholas Schmitt is a 79 y.o. 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 He has been receiving chemo with Dr. Julien Nordmann and more recently immunotherapy. He has also required rad treat to his chest and lumbar spine by Dr. Sondra Come. He recently presented with slurred speech/aphasia and visual problems, he had a head CT on 11/17/2014 demonstrating a new REM enhancing oval legion centered at the operculum measuring 16x20 mm. This was surrounded by significant vasogenic edema.  He has kindly been referred today to discuss possible radiation .  PREVIOUS RADIATION THERAPY: Yes,  1. External radiation followed by Seed implant in 02/22/2004 by Dr Danny Lawless. Currently on hormone therapy for local failure in 2014. 2. Radiation to the chest and lumbar spine January 2016 by Dr. Sondra Come  PAST MEDICAL HISTORY:  has a past medical history of Hypertension; CAD (coronary artery disease); Hyperlipidemia; Hearing loss; AAA (abdominal aortic aneurysm) (11/09); Chronic renal insufficiency; History of radiation therapy (03/08/04- 04/08/04); Myocardial infarction (1995); Pacemaker (2014); Dysrhythmia; Shortness of breath; Asthma; COPD (chronic obstructive pulmonary disease); Radiation (Jan.11-Jan 29/2016); Radiation (Jan.2016); Diverticulosis; Hemorrhoids; Cancer (2015); Prostate cancer (2005); Skin cancer; and  Lung cancer.    PAST SURGICAL HISTORY: Past Surgical History  Procedure Laterality Date  . Ptca  2006  . Coronary stent placement    . Permanent pacemaker insertion Bilateral 02/26/13    MDT Adapta L implanted by Dr Rayann Heman for sick sinus syndrome  . Radioactive seed implant  05/04/2004    8000 cGy, Dr Danny Lawless Dr Reece Agar  . Elbow surgery Bilateral 1973  . Insert / replace / remove pacemaker    . Coronary angioplasty    . Mediastinoscopy N/A 01/28/2014    Procedure: MEDIASTINOSCOPY;  Surgeon: Melrose Nakayama, MD;  Location: [facility];  Service: Thoracic;  Laterality: N/A;  . Permanent pacemaker insertion N/A 02/26/2013    Procedure: PERMANENT PACEMAKER INSERTION;  Surgeon: [name], MD;  Location: [facility];  Service: Cardiovascular;  Laterality: N/A;    FAMILY HISTORY: family history includes Asthma in his brother and sister; Coronary artery disease in his other.  SOCIAL HISTORY:  reports that he quit smoking about 21 years ago. His smoking use included Cigarettes. He has a 105 pack-year smoking history. He has never used smokeless tobacco. He reports that he does not drink alcohol or use illicit drugs.  ALLERGIES: Review of patient's allergies indicates no known allergies.  MEDICATIONS:  Current Outpatient Prescriptions  Medication Sig Dispense Refill  . acetaminophen (TYLENOL) 500 MG tablet Take 500 mg by mouth every 6 (six) hours as needed. 2 tabs    . albuterol (PROVENTIL,VENTOLIN) 90 MCG/ACT inhaler Inhale 2 puffs into the lungs every 6 (six) hours as needed for wheezing or shortness of breath.    . ALPRAZolam (XANAX) 0.25 MG tablet Take 1 tablet (0.25 mg total) by mouth at bedtime as  needed for anxiety. 30 tablet 0  . budesonide-formoterol (SYMBICORT) 160-4.5 MCG/ACT inhaler Inhale 2 puffs into the lungs 2 (two) times daily.    [name] dexamethasone (DECADRON) 4 MG tablet Take 2 tablets (8 mg total) by mouth 2 (two) times daily with a meal. Or as directed 40 tablet 0  .  fluticasone (FLONASE) 50 MCG/ACT nasal spray   5  . hydrocortisone (ANUSOL-HC) 25 MG suppository Use 1 suppository at bedtime for 14 nights then as needed. 14 suppository 3  . lactulose (CHRONULAC) 10 GM/15ML solution Take 15 mLs (10 g total) by mouth 2 (two) times daily as needed for mild constipation. 473 mL 1  . Multiple Vitamin (MULTIVITAMIN) tablet Take 1 tablet by mouth daily.     . naproxen sodium (ANAPROX) 220 MG tablet Take 440 mg by mouth 2 (two) times daily as needed (pain).     . nitroGLYCERIN (NITROSTAT) 0.4 MG SL tablet Place 0.4 mg under the tongue every 5 (five) minutes as needed for chest pain (MAX 3 TABLETS).    [name] nystatin (MYCOSTATIN) 100000 UNIT/ML suspension Take 5 mLs (500,000 Units total) by mouth 4 (four) times daily. 240 mL 1  . omeprazole (PRILOSEC) 40 MG capsule   0  . tamsulosin (FLOMAX) 0.4 MG CAPS capsule   2  . oxyCODONE (OXY IR/ROXICODONE) 5 MG immediate release tablet Take 1-2 tablets (5-10 mg total) by mouth every 4 (four) hours as needed for moderate pain. (Patient not taking: Reported on 11/21/2014) 30 tablet 0  . polyethylene glycol powder (GLYCOLAX/MIRALAX) powder Take 255 g by mouth daily. (Patient not taking: Reported on 11/21/2014) 255 g 1  . prochlorperazine (COMPAZINE) 10 MG tablet Take 1 tablet (10 mg total) by mouth every 6 (six) hours as needed for nausea or vomiting. (Patient not taking: Reported on 11/21/2014) 30 tablet 0  . traMADol (ULTRAM) 50 MG tablet Take 1 tablet (50 mg total) by mouth every 6 (six) hours as needed for moderate pain. (Patient not taking: Reported on 11/21/2014) 30 tablet 0   No current facility-administered medications for this encounter.   Facility-Administered Medications Ordered in Other Encounters  Medication Dose Route Frequency Provider Last Rate Last Dose  . heparin lock flush 100 unit/mL  500 Units Intracatheter Daily PRN Adrena E Johnson, PA-C      . sodium chloride 0.9 % injection 10 mL  10 mL Intracatheter PRN Adrena E  Johnson, PA-C      . sodium chloride 0.9 % injection 3 mL  3 mL Intracatheter PRN Carlton Adam, PA-C        REVIEW OF SYSTEMS:  A 15 point review of systems is documented in the electronic medical record. This was obtained by the nursing staff. However, I reviewed this with the patient to discuss relevant findings and make appropriate changes.  A comprehensive review of systems was negative. Patient reports productive cough with white to yellow sputum. Denies hemoptysis. Reports SOB with mild exertion. Reports he is unable to eat much because his "throat is raw from those treatments." Reports he has lost 40 lb in the last six months. There is not a white coating on the patient's tongue to indicate thrush..   PHYSICAL EXAM:  height is 6' (1.829 m) and weight is 179 lb 8 oz (81.421 kg). His oral temperature is 98 F (36.7 C). His blood pressure is 137/74 and his pulse is 68. His respiration is 20 and oxygen saturation is 94%.   Per Awilda Metro, prior to steroids: General  appearance: alert, cooperative, fatigued, no distress and Speech periodically garbled/unintelligible with clear effort on the patient's part to try to speak clearly Head: Normocephalic, without obvious abnormality, atraumatic Neck: no adenopathy, no carotid bruit, supple, symmetrical, trachea midline and thyroid not enlarged, symmetric, no tenderness/mass/nodules Lymph nodes: Cervical, supraclavicular, and axillary nodes normal. Resp: clear to auscultation bilaterally Back: symmetric, no curvature. ROM normal. No CVA tenderness. Cardio: regular rate and rhythm, S1, S2 normal, no murmur, click, rub or gallop GI: soft, non-tender; bowel sounds normal; no masses, no organomegaly Extremities: extremities normal, atraumatic, no cyanosis or edema Neurologic: Grossly normal speech periodically garbled but then able to speak clearly Skin: no skin lesions or rashes  KPS = 90  100 - Normal; no complaints; no evidence of  disease. 90   - Able to carry on normal activity; minor signs or symptoms of disease. 80   - Normal activity with effort; some signs or symptoms of disease. 36   - Cares for self; unable to carry on normal activity or to do active work. 60   - Requires occasional assistance, but is able to care for most of his personal needs. 50   - Requires considerable assistance and frequent medical care. 6   - Disabled; requires special care and assistance. 18   - Severely disabled; hospital admission is indicated although death not imminent. 88   - Very sick; hospital admission necessary; active supportive treatment necessary. 10   - Moribund; fatal processes progressing rapidly. 0     - Dead  Karnofsky DA, Abelmann [place], Craver LS and Burchenal JH [phone]) The use of the nitrogen mustards in the palliative treatment of carcinoma: with particular reference to bronchogenic carcinoma Cancer 1 634-56  LABORATORY DATA:  Lab Results  Component Value Date   WBC 3.3* 11/11/2014   HGB 11.9* 11/11/2014   HCT 35.2* 11/11/2014   MCV 94.0 11/11/2014   PLT 119* 11/11/2014   Lab Results  Component Value Date   NA 141 11/11/2014   K 4.1 11/11/2014   CL 102 07/24/2014   CO2 25 11/11/2014   Lab Results  Component Value Date   ALT 11 11/11/2014   AST 19 11/11/2014   ALKPHOS 107 11/11/2014   BILITOT 0.73 11/11/2014    PULMONARY FUNCTION TEST:  N/A   RADIOGRAPHY: Ct Head W Wo Contrast  11/17/2014   CLINICAL DATA:  79 year old male with metastatic lung cancer, chemotherapy in progress. Slurred speech and visual problems. Subsequent encounter.  EXAM: CT HEAD WITHOUT AND WITH CONTRAST  TECHNIQUE: Contiguous axial images were obtained from the base of the skull through the vertex without and with intravenous contrast  CONTRAST:  124m OMNIPAQUE IOHEXOL 300 MG/ML  SOLN  COMPARISON:  Head CT without and with contrast 01/29/2014.  FINDINGS: Stable paranasal sinuses and mastoids. Stable visualized osseous structures.  No acute or suspicious calvarium lesion identified.  Visualized orbit soft tissues are within normal limits. Visualized scalp soft tissues are within normal limits.  Stable cerebral volume. New white matter hypodensity in the left hemisphere in a vasogenic edema pattern affecting the insula, operculum, and some of the deep white matter capsules. New associated mostly rim enhancing oval lesion centered at the operculum measuring 16 x 20 mm. Mild rightward midline shift of 4 mm. Mild mass effect on the left lateral ventricle without ventriculomegaly.  Major intracranial vascular structures are enhancing. No other abnormal intracranial enhancement identified. Stable dural calcifications along the tentorium. Stable and normal gray-white matter differentiation elsewhere. No evidence of  cortically based acute infarction identified. No acute intracranial hemorrhage identified.  IMPRESSION: 1. New brain metastasis at the left operculum, 20 mm. Vasogenic edema with mild intracranial mass effect including 4 mm of rightward midline shift. 2. If valuable for treatment planning, brain MRI without and with contrast might detect additional lesions.  These results will be called to the ordering clinician or representative by the Radiology Department at the imaging location.   Electronically Signed   By: Genevie [name] M.D.   On: 11/17/2014 08:36   Dg Esophagus  11/03/2014   CLINICAL DATA:  Dysphagia status post radiation therapy.  EXAM: ESOPHOGRAM / BARIUM SWALLOW / BARIUM TABLET STUDY  TECHNIQUE: Combined double contrast and single contrast examination performed using effervescent crystals, thick barium liquid, and thin barium liquid. The patient was observed with fluoroscopy swallowing a 13 mm barium sulphate tablet.  FLUOROSCOPY TIME:  Radiation Exposure Index (as provided by the fluoroscopic device):  If the device does not provide the exposure index:  Fluoroscopy Time:  dictate in minutes and seconds  Number of Acquired Images:   Fluoroscopy Time:  54 seconds.  COMPARISON:  None.  FINDINGS: The esophagus is patent. No stricture or mass identified. The barium tablet was ingested and easily passed through the esophagus and into the stomach. No hiatal hernia or reflux identified.  A few non propulsive tertiary waves noted.  IMPRESSION: 1. No evidence for stricture or mass.   Electronically Signed   By: Kerby Moors M.D.   On: 11/03/2014 11:34      IMPRESSION: This patient is a very nice 79 yo gentleman with a 2 cm left frontal brain metastasis from adenocarinoma of the right upper lung. The patient would benefit from surgical resection of his brain metastasis.* In addition, the patient would potentially benefit from radiotherapy.* The options include whole brain irradiation versus stereotactic radiosurgery. There are pros and cons associated with each of these potential treatment options. Whole brain radiotherapy would treat the known metastatic deposits and help provide some reduction of risk for future brain metastases. However, whole brain radiotherapy carries potential risks including hair loss, subacute somnolence, and neurocognitive changes including a possible reduction in short-term memory. Whole brain radiotherapy also may carry a lower likelihood of tumor control at the treatment sites because of the low-dose used. Stereotactic radiosurgery carries a higher likelihood for local tumor control at the targeted sites with lower associated risk for neurocognitive changes such as memory loss.* However, the use of stereotactic radiosurgery in this setting may leave the patient at increased risk for new brain metastases elsewhere in the brain as high as 50-60%. Accordingly, patients who receive stereotactic radiosurgery in this setting should undergo ongoing surveillance imaging with brain MRI more frequently in order to identify and treat new small brain metastases before they become symptomatic. Stereotactic radiosurgery does carry  some different risks, including a risk of radionecrosis.  PLAN: Today, I reviewed the findings and workup thus far with the patient. We discussed the dilemma regarding whole brain radiotherapy versus stereotactic radiosurgery. We discussed the pros and cons of each. We also discussed the logistics and delivery of each. We reviewed the results associated with each of the treatments described above. The patient seems to understand the treatment options and would like to proceed with stereotactic radiosurgery.  In terms of timing of the stereotactic radiosurgery, evidence suggests that risk of radionecrosis and leptomeningeal recurrence is lower when used in the pre-operative setting as opposed to post-operative SRS.*  Tentatively, the patient will  be set up for [facility] CT Simulation on Thursday 6/16, SRS Treatment on 6/20, and possible Surgical resection thereafter.  I spent face to face time with the patient and more than 50% of that time was spent in counseling and/or coordination of care.    ------------------------------------------------  Sheral Apley. Tammi Klippel, M.D.    *References:  1: Patchell RA, Tibbs PA, Helene Shoe, [address] RJ, [place], Guy Sandifer JS, Young B. A randomized trial of surgery in the treatment of single metastases to the brain. [place] Feb 22;322(8):494-500. PubMed PMID: 0350093.   2: Patchell RA, Tibbs PA, Regine WF, Jonita Albee, Mohiuddin M, Arrie Eastern, [place], [place], Young B. Postoperative radiotherapy in the treatment of single metastases to the brain: a randomized trial. JAMA. 1998 Nov 4;280(17):1485-9. PubMed PMID: 8182993.   3: Erlene Senters, Wenda Low, Hess KR, Tomie China, Lang FF, Kornguth DG, Steptoe, Swint JM, Shiu AS, Maor MH, [place]. Neurocognition in patients with brain metastases treated with radiosurgery or radiosurgery plus whole-brain irradiation: a randomised controlled trial. Lancet Oncol. 2009 Nov;10(11):1037-44.  doi: 10.1016/S1470-2045(09)70263-3. Epub 2009 Oct 2. PubMed PMID: 71696789.  4: Weyman Rodney, Estill Dooms, Coralee Pesa, Crocker IR, Lorie Phenix, Charlesetta Garibaldi, Press RH, Tanya Nones, [place], Wait SD, Higinio Plan, Shu HG, [place]. Comparing Preoperative With Postoperative Stereotactic Radiosurgery for Resectable Brain Metastases: A Multi-institutional Analysis. Neurosurgery. 2015 Nov 2. [Epub ahead of print] PubMed PMID: 38101751.   This document serves as a record of services personally performed by [name], MD. It was created on his behalf by Arlyce Harman, a trained medical scribe. The creation of this record is based on the scribe's personal observations and the provider's statements to them. This document has been checked and approved by the attending provider.      ------------------------------------------------  Sheral Apley Tammi Klippel, M.D.

## 2014-11-24 ENCOUNTER — Ambulatory Visit: Payer: Medicare Other

## 2014-11-24 ENCOUNTER — Other Ambulatory Visit: Payer: Self-pay | Admitting: Neurosurgery

## 2014-11-25 ENCOUNTER — Ambulatory Visit (HOSPITAL_COMMUNITY)
Admission: RE | Admit: 2014-11-25 | Discharge: 2014-11-25 | Disposition: A | Discharge status: Home / Self Care | Payer: Medicare Other | Source: Ambulatory Visit | Attending: Radiation Oncology | Admitting: Radiation Oncology

## 2014-11-25 ENCOUNTER — Other Ambulatory Visit: Payer: Self-pay | Admitting: Radiation Therapy

## 2014-11-25 ENCOUNTER — Other Ambulatory Visit: Payer: Self-pay | Admitting: Physician Assistant

## 2014-11-25 ENCOUNTER — Telehealth: Payer: Self-pay | Admitting: *Deleted

## 2014-11-25 ENCOUNTER — Ambulatory Visit (HOSPITAL_COMMUNITY)
Admission: RE | Admit: 2014-11-25 | Discharge: 2014-11-25 | Disposition: A | Discharge status: Home / Self Care | Payer: Medicare Other | Source: Ambulatory Visit | Attending: Physician Assistant | Admitting: Physician Assistant

## 2014-11-25 DIAGNOSIS — J90 Pleural effusion, not elsewhere classified: Secondary | ICD-10-CM | POA: Insufficient documentation

## 2014-11-25 DIAGNOSIS — M8448XA Pathological fracture, other site, initial encounter for fracture: Secondary | ICD-10-CM | POA: Diagnosis not present

## 2014-11-25 DIAGNOSIS — C7951 Secondary malignant neoplasm of bone: Secondary | ICD-10-CM | POA: Diagnosis not present

## 2014-11-25 DIAGNOSIS — I700 Atherosclerosis of aorta: Secondary | ICD-10-CM | POA: Diagnosis not present

## 2014-11-25 DIAGNOSIS — K449 Diaphragmatic hernia without obstruction or gangrene: Secondary | ICD-10-CM | POA: Insufficient documentation

## 2014-11-25 DIAGNOSIS — I2510 Atherosclerotic heart disease of native coronary artery without angina pectoris: Secondary | ICD-10-CM | POA: Diagnosis not present

## 2014-11-25 DIAGNOSIS — C7931 Secondary malignant neoplasm of brain: Secondary | ICD-10-CM

## 2014-11-25 DIAGNOSIS — I714 Abdominal aortic aneurysm, without rupture: Secondary | ICD-10-CM | POA: Insufficient documentation

## 2014-11-25 DIAGNOSIS — K5730 Diverticulosis of large intestine without perforation or abscess without bleeding: Secondary | ICD-10-CM | POA: Diagnosis not present

## 2014-11-25 DIAGNOSIS — C3490 Malignant neoplasm of unspecified part of unspecified bronchus or lung: Secondary | ICD-10-CM | POA: Insufficient documentation

## 2014-11-25 DIAGNOSIS — E279 Disorder of adrenal gland, unspecified: Secondary | ICD-10-CM | POA: Diagnosis not present

## 2014-11-25 DIAGNOSIS — K7689 Other specified diseases of liver: Secondary | ICD-10-CM | POA: Insufficient documentation

## 2014-11-25 MED ORDER — IOHEXOL 300 MG/ML  SOLN
100.0000 mL | Freq: Once | INTRAMUSCULAR | Status: AC | PRN
  Administered 2014-11-25: 100 mL via INTRAVENOUS

## 2014-11-25 NOTE — Telephone Encounter (Signed)
Kim asked for call from [facility] about this patient.  "We talked earlier and I can be reached at ext. [phone].

## 2014-11-26 ENCOUNTER — Telehealth: Payer: Self-pay | Admitting: Radiation Oncology

## 2014-11-26 ENCOUNTER — Ambulatory Visit: Payer: Medicare Other | Admitting: Internal Medicine

## 2014-11-26 ENCOUNTER — Other Ambulatory Visit: Payer: Self-pay | Admitting: Radiation Oncology

## 2014-11-26 ENCOUNTER — Ambulatory Visit (HOSPITAL_COMMUNITY)
Admission: RE | Admit: 2014-11-26 | Discharge: 2014-11-26 | Disposition: A | Discharge status: Home / Self Care | Payer: Medicare Other | Source: Ambulatory Visit | Attending: Radiation Oncology | Admitting: Radiation Oncology

## 2014-11-26 ENCOUNTER — Encounter (HOSPITAL_COMMUNITY): Payer: Self-pay

## 2014-11-26 DIAGNOSIS — C3490 Malignant neoplasm of unspecified part of unspecified bronchus or lung: Secondary | ICD-10-CM | POA: Insufficient documentation

## 2014-11-26 DIAGNOSIS — I672 Cerebral atherosclerosis: Secondary | ICD-10-CM | POA: Insufficient documentation

## 2014-11-26 DIAGNOSIS — Z01818 Encounter for other preprocedural examination: Secondary | ICD-10-CM | POA: Insufficient documentation

## 2014-11-26 DIAGNOSIS — C7931 Secondary malignant neoplasm of brain: Secondary | ICD-10-CM

## 2014-11-26 DIAGNOSIS — C3400 Malignant neoplasm of unspecified main bronchus: Secondary | ICD-10-CM

## 2014-11-26 MED ORDER — IOHEXOL 300 MG/ML  SOLN
100.0000 mL | Freq: Once | INTRAMUSCULAR | Status: AC | PRN
  Administered 2014-11-26: 100 mL via INTRAVENOUS

## 2014-11-26 MED ORDER — DEXAMETHASONE 4 MG PO TABS: 8.0000 mg | ORAL_TABLET | Freq: Two times a day (BID) | ORAL | Status: DC

## 2014-11-26 NOTE — Telephone Encounter (Signed)
Learned from Mont Dutton, RT that the patient is requesting a refill of his decadron. Phoned patient at home. He reports taking decadron 8 mg bid. The patient reports that he has enough tablets to get through today but, would definitely need a refill by tomorrow. Patient scheduled to be seen in the rad onc clinic tomorrow. Explained to the patient that Dr. Tammi Klippel would refill his decadron tomorrow while he is here for his appointment. Patient verbalized understanding.

## 2014-11-26 NOTE — Progress Notes (Signed)
Refilled decadron to his pharmacy on record, [facility]

## 2014-11-26 NOTE — Telephone Encounter (Signed)
Phoned patient on both his cell and home number to inform him decadron was called into his pharmacy. No answer at either. Left message on both.

## 2014-11-27 ENCOUNTER — Telehealth: Payer: Self-pay | Admitting: Medical Oncology

## 2014-11-27 ENCOUNTER — Encounter: Payer: Self-pay | Admitting: Radiation Oncology

## 2014-11-27 ENCOUNTER — Ambulatory Visit
Admission: RE | Admit: 2014-11-27 | Discharge: 2014-11-27 | Disposition: A | Discharge status: Home / Self Care | Payer: Medicare Other | Source: Ambulatory Visit | Attending: Radiation Oncology | Admitting: Radiation Oncology

## 2014-11-27 VITALS — BP 123/54 | HR 76 | Resp 16 | Wt 173.4 lb

## 2014-11-27 DIAGNOSIS — C7931 Secondary malignant neoplasm of brain: Secondary | ICD-10-CM

## 2014-11-27 DIAGNOSIS — Z510 Encounter for antineoplastic radiation therapy: Secondary | ICD-10-CM | POA: Diagnosis not present

## 2014-11-27 MED ORDER — SODIUM CHLORIDE 0.9 % IJ SOLN
10.0000 mL | Freq: Once | INTRAMUSCULAR | Status: AC
  Administered 2014-11-27: 10 mL via INTRAVENOUS

## 2014-11-27 NOTE — Progress Notes (Signed)
Left AC IV removed by Mont Dutton, RT. She reports the catheter was intact upon removal and that she applied a bandaid to the removal site. She reports the patient tolerated this well.

## 2014-11-27 NOTE — Progress Notes (Signed)
  Radiation Oncology         [phone] ________________________________  Name: Nicholas Schmitt MRN: [id number]  Date: 11/27/2014  DOB: 1935/10/16  SIMULATION AND TREATMENT PLANNING NOTE  DIAGNOSIS: 79 y.o. gentleman with a 2 cm left operculum brain metastasis from non-small cell lung carcinoma  NARRATIVE:  The patient was brought to the [facility].  Identity was confirmed.  All relevant records and images related to the planned course of therapy were reviewed.  The patient freely provided informed written consent to proceed with treatment after reviewing the details related to the planned course of therapy. The consent form was witnessed and verified by the simulation staff. Intravenous access was established for contrast administration. Then, the patient was set-up in a stable reproducible supine position for radiation therapy.  A relocatable thermoplastic stereotactic head frame was fabricated for precise immobilization.  CT images were obtained.  Surface markings were placed.  The CT images were loaded into the planning software and fused with the patient's targeting MRI scan.  Then the target and avoidance structures were contoured.  Treatment planning then occurred.  The radiation prescription was entered and confirmed.  I have requested 3D planning  I have requested a DVH of the following structures: Brain stem, brain, left eye, right eye, lenses, optic chiasm, target volumes, uninvolved brain, and normal tissue.    SPECIAL TREATMENT PROCEDURE:  The planned course of therapy using radiation constitutes a special treatment procedure. Special care is required in the management of this patient for the following reasons. This treatment constitutes a Special Treatment Procedure for the following reason: High dose per fraction requiring special monitoring for increased toxicities of treatment including daily imaging.  The special nature of the planned course of radiotherapy will  require increased physician supervision and oversight to ensure patient's safety with optimal treatment outcomes.  PLAN:  The patient will receive 18 Gy in 1 fraction.  This document serves as a record of services personally performed by [name], MD. It was created on his behalf by Jeralene Peters, a trained medical scribe. The creation of this record is based on the scribe's personal observations and the provider's statements to them. This document has been checked and approved by the attending provider.       ________________________________  Sheral Apley. Tammi Klippel, M.D.

## 2014-11-27 NOTE — Progress Notes (Signed)
Weight and vitals stable. Denies pain. Denies contrast allergy. Denies taking metformin. On 11/11/14 patient's BUN was 12.5 and creatinine 0.8. Inserted a left AC 22 gauge IV on the first attempt. Excellent blood return obtained. Site flushed without difficulty. Patient tolerated well. Secured IV in place. Patient escorted to CT/SIM by RT.

## 2014-11-27 NOTE — Telephone Encounter (Signed)
Family member  asking if pt needs to cancel appt next Monday -he is have a radiation procedure on his brain this week. I told her i will talk to [facility]

## 2014-11-28 ENCOUNTER — Ambulatory Visit (HOSPITAL_COMMUNITY): Payer: Medicare Other

## 2014-11-28 ENCOUNTER — Encounter (HOSPITAL_COMMUNITY)
Admission: RE | Admit: 2014-11-28 | Discharge: 2014-11-28 | Disposition: A | Discharge status: Home / Self Care | Payer: Medicare Other | Source: Ambulatory Visit | Attending: Neurosurgery | Admitting: Neurosurgery

## 2014-11-28 ENCOUNTER — Encounter (HOSPITAL_COMMUNITY): Payer: Self-pay

## 2014-11-28 DIAGNOSIS — I129 Hypertensive chronic kidney disease with stage 1 through stage 4 chronic kidney disease, or unspecified chronic kidney disease: Secondary | ICD-10-CM | POA: Insufficient documentation

## 2014-11-28 DIAGNOSIS — I714 Abdominal aortic aneurysm, without rupture: Secondary | ICD-10-CM | POA: Diagnosis not present

## 2014-11-28 DIAGNOSIS — N189 Chronic kidney disease, unspecified: Secondary | ICD-10-CM | POA: Diagnosis not present

## 2014-11-28 DIAGNOSIS — Z510 Encounter for antineoplastic radiation therapy: Secondary | ICD-10-CM | POA: Diagnosis not present

## 2014-11-28 DIAGNOSIS — J45909 Unspecified asthma, uncomplicated: Secondary | ICD-10-CM | POA: Insufficient documentation

## 2014-11-28 DIAGNOSIS — I252 Old myocardial infarction: Secondary | ICD-10-CM | POA: Insufficient documentation

## 2014-11-28 DIAGNOSIS — Z0183 Encounter for blood typing: Secondary | ICD-10-CM | POA: Insufficient documentation

## 2014-11-28 DIAGNOSIS — K219 Gastro-esophageal reflux disease without esophagitis: Secondary | ICD-10-CM | POA: Diagnosis not present

## 2014-11-28 DIAGNOSIS — Z950 Presence of cardiac pacemaker: Secondary | ICD-10-CM | POA: Insufficient documentation

## 2014-11-28 DIAGNOSIS — Z01812 Encounter for preprocedural laboratory examination: Secondary | ICD-10-CM | POA: Insufficient documentation

## 2014-11-28 DIAGNOSIS — Z87891 Personal history of nicotine dependence: Secondary | ICD-10-CM | POA: Diagnosis not present

## 2014-11-28 DIAGNOSIS — I495 Sick sinus syndrome: Secondary | ICD-10-CM | POA: Diagnosis not present

## 2014-11-28 DIAGNOSIS — Z79899 Other long term (current) drug therapy: Secondary | ICD-10-CM | POA: Insufficient documentation

## 2014-11-28 DIAGNOSIS — I2510 Atherosclerotic heart disease of native coronary artery without angina pectoris: Secondary | ICD-10-CM | POA: Insufficient documentation

## 2014-11-28 DIAGNOSIS — Z85118 Personal history of other malignant neoplasm of bronchus and lung: Secondary | ICD-10-CM | POA: Insufficient documentation

## 2014-11-28 DIAGNOSIS — E785 Hyperlipidemia, unspecified: Secondary | ICD-10-CM | POA: Insufficient documentation

## 2014-11-28 DIAGNOSIS — Z01818 Encounter for other preprocedural examination: Secondary | ICD-10-CM | POA: Insufficient documentation

## 2014-11-28 DIAGNOSIS — J449 Chronic obstructive pulmonary disease, unspecified: Secondary | ICD-10-CM | POA: Diagnosis not present

## 2014-11-28 DIAGNOSIS — Z8546 Personal history of malignant neoplasm of prostate: Secondary | ICD-10-CM | POA: Diagnosis not present

## 2014-11-28 HISTORY — DX: Pneumonia, unspecified organism: J18.9

## 2014-11-28 HISTORY — DX: Gastro-esophageal reflux disease without esophagitis: K21.9

## 2014-11-28 LAB — BASIC METABOLIC PANEL
Anion gap: 9 (ref 5–15)
BUN: 26 mg/dL — AB (ref 6–20)
CALCIUM: 9.6 mg/dL (ref 8.9–10.3)
CO2: 25 mmol/L (ref 22–32)
CREATININE: 0.73 mg/dL (ref 0.61–1.24)
Chloride: 101 mmol/L (ref 101–111)
Glucose, Bld: 111 mg/dL — ABNORMAL HIGH (ref 65–99)
POTASSIUM: 4.8 mmol/L (ref 3.5–5.1)
Sodium: 135 mmol/L (ref 135–145)

## 2014-11-28 LAB — CBC
HCT: 40.1 % (ref 39.0–52.0)
Hemoglobin: 13.7 g/dL (ref 13.0–17.0)
MCH: 31.6 pg (ref 26.0–34.0)
MCHC: 34.2 g/dL (ref 30.0–36.0)
MCV: 92.6 fL (ref 78.0–100.0)
PLATELETS: 163 10*3/uL (ref 150–400)
RBC: 4.33 MIL/uL (ref 4.22–5.81)
RDW: 15.8 % — ABNORMAL HIGH (ref 11.5–15.5)
WBC: 9.7 10*3/uL (ref 4.0–10.5)

## 2014-11-28 LAB — TYPE AND SCREEN
ABO/RH(D): O POS
Antibody Screen: NEGATIVE

## 2014-11-28 LAB — SURGICAL PCR SCREEN
MRSA, PCR: NEGATIVE
STAPHYLOCOCCUS AUREUS: NEGATIVE

## 2014-11-28 NOTE — Progress Notes (Signed)
Patient to see dr Rayann Heman 12/03/14 for check

## 2014-11-28 NOTE — Pre-Procedure Instructions (Addendum)
Nicholas Schmitt  11/28/2014      [facility] - [place], [place] - [place] [address] AT [facility] [address] [place] [address] Phone: [phone] Fax: [phone]  [place], [facility] [address] [place] [address] [place] [address] Phone: [phone] Fax: [phone]  [facility]) [place], [place] [address] Phone: [phone] Fax: [phone]  [id number] [address]  [id number]: [phone]: [phone]   Your procedure is scheduled on 12/04/14.  [facility] at 900 A.M.  Call this number if you have problems the morning of surgery:  [phone]   Remember:  Do not eat food or drink liquids after midnight.  Take these medicines the morning of surgery with A SIP OF WATER tylenol, nitro if needed, decadron,inhalers if needed (bring albuterol),prilosec, flomax     STOP all herbel meds, nsaids (aleve,naproxen,advil,ibuprofen) 5 days prior to surgery starting now including aspirin, vitamins    Do not wear jewelry, make-up or nail polish.  Do not wear lotions, powders, or perfumes.  You may wear deodorant.  Do not shave 48 hours prior to surgery.  Men may shave face and neck.  Do not bring valuables to the hospital.  [facility] belongings or [facility]ables.  Contacts, dentures or bridgework may not be worn into surgery.  Leave your suitcase in the car.  After surgery it may be brought to your room.  For patients admitted to the hospital, discharge time will be determined by your treatment team.  Patients discharged the day of surgery will not be allowed to drive home.   Name and phone number of your driver:    Special  instructions:   Special Instructions: [facility] - Preparing for Surgery  Before surgery, you can play an important role.  Because skin is not sterile, your skin needs to be as free of germs as possible.  You can reduce the number of germs on you skin by washing with CHG (chlorahexidine gluconate) soap before surgery.  CHG is an antiseptic cleaner which kills germs and bonds with the skin to continue killing germs even after washing.  Please DO NOT use if you have an allergy to CHG or antibacterial soaps.  If your skin becomes reddened/irritated stop using the CHG and inform your nurse when you arrive at Short Stay.  Do not shave (including legs and underarms) for at least 48 hours prior to the first CHG shower.  You may shave your face.  Please follow these instructions carefully:   1.  Shower with CHG Soap the night before surgery and the morning of Surgery.  2.  If you choose to wash your hair, wash your hair first as usual with your normal shampoo.  3.  After you shampoo, rinse your hair and body thoroughly to remove the Shampoo.  4.  Use CHG as you would any other liquid soap.  You can apply chg directly  to the skin and wash gently with scrungie or a clean washcloth.  5.  Apply the CHG Soap to your body ONLY FROM THE NECK DOWN.  Do not use on open wounds or open sores.  Avoid contact with your eyes  ears, mouth and genitals (private parts).  Wash genitals (private parts)       with your normal soap.  6.  Wash thoroughly, paying special attention to the area where your surgery will be performed.  7.  Thoroughly rinse your body with warm water from the neck down.  8.  DO NOT shower/wash with your normal soap after using and rinsing off the CHG Soap.  9.  Pat yourself dry with a clean towel.            10.  Wear clean pajamas.            11.  Place clean sheets on your bed the night of your first shower and do not sleep with pets.  Day of Surgery  Do not apply any lotions/deodorants the  morning of surgery.  Please wear clean clothes to the hospital/surgery center.  Please read over the following fact sheets that you were given. Pain Booklet, Coughing and Deep Breathing, Blood Transfusion Information, MRSA Information and Surgical Site Infection Prevention

## 2014-11-30 NOTE — Progress Notes (Signed)
  Radiation Oncology         [phone] ________________________________  Stereotactic Treatment Procedure Note  Name: Nicholas Schmitt MRN: [id number]  Date: 12/01/2014  DOB: September 12, 1935  SPECIAL TREATMENT PROCEDURE    ICD-9-CM ICD-10-CM   1. Brain metastasis 198.3 C79.31     3D TREATMENT PLANNING AND DOSIMETRY:  The patient's radiation plan was reviewed and approved by neurosurgery and radiation oncology prior to treatment.  It showed 3-dimensional radiation distributions overlaid onto the planning CT/MRI image set.  [facility] for the target structures as well as the organs at risk were reviewed. The documentation of the 3D plan and dosimetry are filed in the radiation oncology EMR.  NARRATIVE:  Nicholas Schmitt was brought to the TrueBeam stereotactic radiation treatment machine and placed supine on the CT couch. The head frame was applied, and the patient was set up for stereotactic radiosurgery.  Neurosurgery was present for the set-up and delivery  SIMULATION VERIFICATION:  In the couch zero-angle position, the patient underwent Exactrac imaging using the Brainlab system with orthogonal KV images.  These were carefully aligned and repeated to confirm treatment position for each of the isocenters.  The Exactrac snap film verification was repeated at each couch angle.  PROCEDURE: Nicholas Schmitt received stereotactic radiosurgery to the following targets: Left Operculum 20 mm target was treated using 5 Dynamic Conformal Arcs to a prescription dose of 18 Gy.  ExacTrac registration was performed for each couch angle.  The 81.3% isodose line was prescribed.  6 MV X-rays were delivered in the flattening filter free beam mode.  STEREOTACTIC TREATMENT MANAGEMENT:  Following delivery, the patient was transported to nursing in stable condition and monitored for possible acute effects.  Vital signs were recorded BP 112/62 mmHg  Pulse 71  Temp(Src) 97.7 F (36.5 C) (Oral)  Resp 12  SpO2 100%.  The patient tolerated treatment without significant acute effects, and was discharged to home in stable condition.    PLAN: Follow-up in one month.  ________________________________  Sheral Apley. Tammi Klippel, M.D.

## 2014-12-01 ENCOUNTER — Ambulatory Visit
Admission: RE | Admit: 2014-12-01 | Discharge: 2014-12-01 | Disposition: A | Discharge status: Home / Self Care | Payer: Medicare Other | Source: Ambulatory Visit | Attending: Radiation Oncology | Admitting: Radiation Oncology

## 2014-12-01 ENCOUNTER — Telehealth: Payer: Self-pay | Admitting: Internal Medicine

## 2014-12-01 VITALS — BP 110/68 | HR 77 | Temp 97.7°F | Resp 16

## 2014-12-01 DIAGNOSIS — Z510 Encounter for antineoplastic radiation therapy: Secondary | ICD-10-CM | POA: Diagnosis not present

## 2014-12-01 DIAGNOSIS — C7931 Secondary malignant neoplasm of brain: Secondary | ICD-10-CM

## 2014-12-01 NOTE — Telephone Encounter (Signed)
-----   Message from Curt Bears, MD sent at 11/28/2014  9:51 AM EDT ----- Yes. Cancel his treatment next week. ----- Message -----    From: Ardeen Garland, RN    Sent: 11/27/2014   5:45 PM      To: Lucile Crater, RN, Curt Bears, MD    Family member asking if pt needs to cancel all appts next Monday 12/02/14-?  (he is have a radiation procedure on his brain this week) .  I told her probably so ,but will check with [facility].  Chrisandra Carota if so please make sure they are cancelled in EPIC so they won't get calls.

## 2014-12-01 NOTE — Op Note (Signed)
  Name: Nicholas Schmitt  MRN: [id number]  Date: 12/01/2014   DOB: 1935/07/02  Stereotactic Radiosurgery Operative Note  PRE-OPERATIVE DIAGNOSIS:  Solitary Brain Metastasis  POST-OPERATIVE DIAGNOSIS:  Solitary Brain Metastasis  PROCEDURE:  Stereotactic Radiosurgery  SURGEON:  Peggyann Shoals, MD  NARRATIVE: The patient underwent a radiation treatment planning session in the radiation oncology simulation suite under the care of the radiation oncology physician and physicist.  I participated closely in the radiation treatment planning afterwards. The patient underwent planning CT which was fused to 3T high resolution MRI with 1 mm axial slices.  These images were fused on the planning system.  We contoured the gross target volumes and subsequently expanded this to yield the Planning Target Volume. I actively participated in the planning process.  I helped to define and review the target contours and also the contours of the optic pathway, eyes, brainstem and selected nearby organs at risk.  All the dose constraints for critical structures were reviewed and compared to AAPM Task Group 101.  The prescription dose conformity was reviewed.  I approved the plan electronically.    Accordingly, [name] was brought to the TrueBeam stereotactic radiation treatment linac and placed in the custom immobilization mask.  The patient was aligned according to the IR fiducial markers with BrainLab Exactrac, then orthogonal x-rays were used in ExacTrac with the 6DOF robotic table and the shifts were made to align the patient  [name] received stereotactic radiosurgery uneventfully.    The detailed description of the procedure is recorded in the radiation oncology procedure note.  I was present for the duration of the procedure.  DISPOSITION:  Following delivery, the patient was transported to nursing in stable condition and monitored for possible acute effects to be discharged to home in stable  condition with follow-up in one month.  Peggyann Shoals, MD 12/01/2014 8:28 AM

## 2014-12-01 NOTE — Telephone Encounter (Addendum)
POF to scheduling to cancel pt 6/21 appts Notified pt and per pt request called June ( fiance) and notified her also.

## 2014-12-01 NOTE — Progress Notes (Signed)
Anesthesia Chart Review:  Pt is 79 year old male scheduled for craniotomy, tumor excision on 12/04/2014 with Dr. Vertell Limber.   Cardiologist Dr. Aundra Dubin. EP cardiologist Dr. Rayann Heman.   PMH includes: CAD (heart cath (8/06) showing 95% prox RCA, 90% CFX, and 90% mLAD; patient had a cypher DES to all 3 lesions),  AAA, MI (1995), HTN, sick sinus syndrome, pacemaker ([employer]), hyperlipidemia, asthma, COPD, prostate cancer, lung cancer, chronic renal insufficiency, dysrhythmia, GERD. Former smoker. BMI 24. S/p mediastinoscopy 01/28/14.   Medications include: dexamethasone, symbicort, albuterol.   Preoperative labs reviewed.    EKG 07/21/2014: Sinus arrhythmia. Right bundle branch block. Baseline wander in lead(s) V2.  Echo 07/24/2014: - Left ventricle: The cavity size was normal. Wall thickness was increased increased in a pattern of mild to moderate LVH. Systolic function was normal. The estimated ejection fraction was in the range of 60% to 65%. Wall motion was normal; there were no regional wall motion abnormalities. Doppler parameters are consistent with abnormal left ventricular relaxation (grade 1 diastolic dysfunction). - Right ventricle: The cavity size was dilated. Wall thickness was normal. - Right atrium: The atrium was mildly dilated.  PFTs 07/07/2014: FEV1` 2.31 (73%) and ratio 73 p 10% better from saba with symbicort 160 am of ov and dlco 47%  Carotid duplex US 04/09/2014: -stable, 1-39% bilateral ICA stenosis  AAA duplex US 04/09/2014: -Stable dimensions of infrarenal hour glass shaped AAA, at 3.2 cm x 3.4 cm. -Anterior outpouching of aorta is similar in appearance to previous study. -Normal caliber common and external iliac arteries, bilaterally. - >50% bilateral common iliac artery stenosis. -The IVC is patent.  -F/U 1 year.  Awaiting pacemaker prescription form from Dr. Rayann Heman.   Willeen Cass, FNP-BC [facility]/Anesthesiology Phone: [phone] 12/01/2014  10:52 AM

## 2014-12-01 NOTE — Progress Notes (Signed)
Pt arrived to nursing.  Vital signs assessed, within normal range.  Pt denies pain, headache, auditory changes, nausea and vomiting.  Reports "slight" visual blurring over bilateral eyes.  Pt reports they are taking 8 mg Decadron bid.

## 2014-12-01 NOTE — Progress Notes (Signed)
Patient discharged home with wife. Vitals stable. Denies pain. Reports taking Decadron 8 mg bid. Patient understands to avoid strenuous activity for the next 24 hours and call [phone] with future needs. Patient scheduled for surgery on Thursday and confirms he has already received directions from Dr. Melven Sartorius office.

## 2014-12-01 NOTE — Telephone Encounter (Signed)
cx appt per pof...per orders pof pt aware

## 2014-12-01 NOTE — Addendum Note (Signed)
Encounter addended by: Heywood Footman, RN on: 12/01/2014 10:39 AM<BR>     Documentation filed: Notes Section, Vitals Section

## 2014-12-02 ENCOUNTER — Ambulatory Visit: Payer: Medicare Other | Admitting: Physician Assistant

## 2014-12-02 ENCOUNTER — Other Ambulatory Visit: Payer: Medicare Other

## 2014-12-02 ENCOUNTER — Ambulatory Visit: Payer: Medicare Other

## 2014-12-02 ENCOUNTER — Encounter: Payer: Medicare Other | Admitting: Nutrition

## 2014-12-03 ENCOUNTER — Encounter: Payer: Self-pay | Admitting: Internal Medicine

## 2014-12-03 ENCOUNTER — Ambulatory Visit (INDEPENDENT_AMBULATORY_CARE_PROVIDER_SITE_OTHER): Payer: Medicare Other | Admitting: *Deleted

## 2014-12-03 DIAGNOSIS — I495 Sick sinus syndrome: Secondary | ICD-10-CM | POA: Diagnosis not present

## 2014-12-03 MED ORDER — CEFAZOLIN SODIUM-DEXTROSE 2-3 GM-% IV SOLR
2.0000 g | INTRAVENOUS | Status: AC
  Administered 2014-12-04: 2 g via INTRAVENOUS
  Filled 2014-12-03: qty 50

## 2014-12-03 NOTE — Progress Notes (Signed)
Dr Donald Pore office called and spoke with Janett Billow asked her if pt needs cardiac clearance, she states not that she knows of.

## 2014-12-04 ENCOUNTER — Encounter (HOSPITAL_COMMUNITY)
Admission: RE | Disposition: A | Discharge status: Home Health | Payer: Self-pay | Source: Ambulatory Visit | Attending: Neurosurgery

## 2014-12-04 ENCOUNTER — Inpatient Hospital Stay (HOSPITAL_COMMUNITY): Payer: Medicare Other | Admitting: Emergency Medicine

## 2014-12-04 ENCOUNTER — Encounter (HOSPITAL_COMMUNITY): Payer: Self-pay | Admitting: Surgery

## 2014-12-04 ENCOUNTER — Inpatient Hospital Stay (HOSPITAL_COMMUNITY)
Admission: RE | Admit: 2014-12-04 | Discharge: 2014-12-06 | DRG: 026 | Disposition: A | Discharge status: Home Health | Payer: Medicare Other | Source: Ambulatory Visit | Attending: Neurosurgery | Admitting: Neurosurgery

## 2014-12-04 ENCOUNTER — Inpatient Hospital Stay (HOSPITAL_COMMUNITY): Payer: Medicare Other | Admitting: Anesthesiology

## 2014-12-04 DIAGNOSIS — C7931 Secondary malignant neoplasm of brain: Secondary | ICD-10-CM | POA: Diagnosis present

## 2014-12-04 DIAGNOSIS — Z87898 Personal history of other specified conditions: Secondary | ICD-10-CM

## 2014-12-04 DIAGNOSIS — I2510 Atherosclerotic heart disease of native coronary artery without angina pectoris: Secondary | ICD-10-CM | POA: Diagnosis present

## 2014-12-04 DIAGNOSIS — R4701 Aphasia: Secondary | ICD-10-CM | POA: Diagnosis present

## 2014-12-04 DIAGNOSIS — Z8546 Personal history of malignant neoplasm of prostate: Secondary | ICD-10-CM

## 2014-12-04 DIAGNOSIS — Z955 Presence of coronary angioplasty implant and graft: Secondary | ICD-10-CM | POA: Diagnosis not present

## 2014-12-04 DIAGNOSIS — C3490 Malignant neoplasm of unspecified part of unspecified bronchus or lung: Secondary | ICD-10-CM | POA: Diagnosis present

## 2014-12-04 DIAGNOSIS — Z950 Presence of cardiac pacemaker: Secondary | ICD-10-CM | POA: Diagnosis not present

## 2014-12-04 DIAGNOSIS — J449 Chronic obstructive pulmonary disease, unspecified: Secondary | ICD-10-CM | POA: Diagnosis present

## 2014-12-04 DIAGNOSIS — I252 Old myocardial infarction: Secondary | ICD-10-CM | POA: Diagnosis not present

## 2014-12-04 HISTORY — PX: CRANIOTOMY: SHX93

## 2014-12-04 HISTORY — PX: APPLICATION OF CRANIAL NAVIGATION: SHX6578

## 2014-12-04 LAB — MRSA PCR SCREENING: MRSA by PCR: NEGATIVE

## 2014-12-04 SURGERY — CRANIOTOMY TUMOR EXCISION
Anesthesia: General

## 2014-12-04 MED ORDER — ALPRAZOLAM 0.25 MG PO TABS: 0.2500 mg | ORAL_TABLET | Freq: Every evening | ORAL | Status: DC | PRN

## 2014-12-04 MED ORDER — NEOSTIGMINE METHYLSULFATE 10 MG/10ML IV SOLN
INTRAVENOUS | Status: AC
  Filled 2014-12-04: qty 1

## 2014-12-04 MED ORDER — CEFAZOLIN SODIUM-DEXTROSE 2-3 GM-% IV SOLR
2.0000 g | Freq: Three times a day (TID) | INTRAVENOUS | Status: AC
  Administered 2014-12-04 – 2014-12-05 (×2): 2 g via INTRAVENOUS
  Filled 2014-12-04 (×2): qty 50

## 2014-12-04 MED ORDER — PROCHLORPERAZINE MALEATE 10 MG PO TABS: 10.0000 mg | ORAL_TABLET | Freq: Four times a day (QID) | ORAL | Status: DC | PRN

## 2014-12-04 MED ORDER — FENTANYL CITRATE (PF) 100 MCG/2ML IJ SOLN
INTRAMUSCULAR | Status: DC | PRN
  Administered 2014-12-04 (×2): 50 ug via INTRAVENOUS
  Administered 2014-12-04: 100 ug via INTRAVENOUS

## 2014-12-04 MED ORDER — BUPIVACAINE HCL (PF) 0.5 % IJ SOLN
INTRAMUSCULAR | Status: DC | PRN
  Administered 2014-12-04: 5 mL

## 2014-12-04 MED ORDER — ALBUTEROL SULFATE (2.5 MG/3ML) 0.083% IN NEBU: 2.5000 mg | INHALATION_SOLUTION | Freq: Four times a day (QID) | RESPIRATORY_TRACT | Status: DC | PRN

## 2014-12-04 MED ORDER — SODIUM CHLORIDE 0.9 % IV SOLN
INTRAVENOUS | Status: DC | PRN
  Administered 2014-12-04: 11:00:00 via INTRAVENOUS

## 2014-12-04 MED ORDER — BACITRACIN ZINC 500 UNIT/GM EX OINT
TOPICAL_OINTMENT | CUTANEOUS | Status: DC | PRN
  Administered 2014-12-04: 1 via TOPICAL

## 2014-12-04 MED ORDER — FENTANYL CITRATE (PF) 250 MCG/5ML IJ SOLN
INTRAMUSCULAR | Status: AC
  Filled 2014-12-04: qty 5

## 2014-12-04 MED ORDER — LIDOCAINE HCL (CARDIAC) 20 MG/ML IV SOLN
INTRAVENOUS | Status: AC
  Filled 2014-12-04: qty 5

## 2014-12-04 MED ORDER — HYDROCODONE-ACETAMINOPHEN 5-325 MG PO TABS: 1.0000 | ORAL_TABLET | ORAL | Status: DC | PRN

## 2014-12-04 MED ORDER — PROPOFOL 10 MG/ML IV BOLUS
INTRAVENOUS | Status: DC | PRN
  Administered 2014-12-04: 50 mg via INTRAVENOUS
  Administered 2014-12-04: 30 mg via INTRAVENOUS
  Administered 2014-12-04: 150 mg via INTRAVENOUS

## 2014-12-04 MED ORDER — SODIUM CHLORIDE 0.9 % IV SOLN
500.0000 mg | Freq: Two times a day (BID) | INTRAVENOUS | Status: DC
  Administered 2014-12-04 – 2014-12-06 (×3): 500 mg via INTRAVENOUS
  Filled 2014-12-04 (×6): qty 5

## 2014-12-04 MED ORDER — ACETAMINOPHEN 650 MG RE SUPP: 650.0000 mg | RECTAL | Status: DC | PRN

## 2014-12-04 MED ORDER — GLYCOPYRROLATE 0.2 MG/ML IJ SOLN
INTRAMUSCULAR | Status: DC | PRN
  Administered 2014-12-04: .4 mg via INTRAVENOUS

## 2014-12-04 MED ORDER — NITROGLYCERIN 0.4 MG SL SUBL: 0.4000 mg | SUBLINGUAL_TABLET | SUBLINGUAL | Status: DC | PRN

## 2014-12-04 MED ORDER — THROMBIN 5000 UNITS EX SOLR
CUTANEOUS | Status: DC | PRN
  Administered 2014-12-04: 13:00:00 via TOPICAL

## 2014-12-04 MED ORDER — DEXAMETHASONE SODIUM PHOSPHATE 10 MG/ML IJ SOLN
6.0000 mg | Freq: Four times a day (QID) | INTRAMUSCULAR | Status: DC
  Administered 2014-12-04 – 2014-12-05 (×2): 6 mg via INTRAVENOUS
  Filled 2014-12-04: qty 0.6
  Filled 2014-12-04: qty 1
  Filled 2014-12-04: qty 0.6

## 2014-12-04 MED ORDER — GLYCOPYRROLATE 0.2 MG/ML IJ SOLN
INTRAMUSCULAR | Status: AC
  Filled 2014-12-04: qty 2

## 2014-12-04 MED ORDER — SODIUM CHLORIDE 0.9 % IJ SOLN
INTRAMUSCULAR | Status: AC
  Filled 2014-12-04: qty 10

## 2014-12-04 MED ORDER — ONDANSETRON HCL 4 MG/2ML IJ SOLN
INTRAMUSCULAR | Status: DC | PRN
  Administered 2014-12-04: 4 mg via INTRAVENOUS

## 2014-12-04 MED ORDER — PROMETHAZINE HCL 25 MG/ML IJ SOLN: 6.2500 mg | INTRAMUSCULAR | Status: DC | PRN

## 2014-12-04 MED ORDER — DEXAMETHASONE 4 MG PO TABS: 8.0000 mg | ORAL_TABLET | Freq: Two times a day (BID) | ORAL | Status: DC

## 2014-12-04 MED ORDER — FLEET ENEMA 7-19 GM/118ML RE ENEM
1.0000 | ENEMA | Freq: Once | RECTAL | Status: AC | PRN
  Filled 2014-12-04: qty 1

## 2014-12-04 MED ORDER — ROCURONIUM BROMIDE 50 MG/5ML IV SOLN
INTRAVENOUS | Status: AC
  Filled 2014-12-04: qty 1

## 2014-12-04 MED ORDER — SENNOSIDES 15 MG PO CHEW: 15.0000 mg | CHEWABLE_TABLET | Freq: Every day | ORAL | Status: DC

## 2014-12-04 MED ORDER — ONDANSETRON HCL 4 MG/2ML IJ SOLN
4.0000 mg | INTRAMUSCULAR | Status: DC | PRN
Start: 2014-12-04 — End: 2014-12-06

## 2014-12-04 MED ORDER — CALCIUM-MAGNESIUM-ZINC PO TABS: 1.0000 | ORAL_TABLET | Freq: Every day | ORAL | Status: DC

## 2014-12-04 MED ORDER — THROMBIN 20000 UNITS EX SOLR
CUTANEOUS | Status: DC | PRN
  Administered 2014-12-04: 12:00:00 via TOPICAL

## 2014-12-04 MED ORDER — NEOSTIGMINE METHYLSULFATE 10 MG/10ML IV SOLN
INTRAVENOUS | Status: DC | PRN
  Administered 2014-12-04: 3 mg via INTRAVENOUS

## 2014-12-04 MED ORDER — LACTATED RINGERS IV SOLN
INTRAVENOUS | Status: DC | PRN
  Administered 2014-12-04: 11:00:00 via INTRAVENOUS

## 2014-12-04 MED ORDER — PANTOPRAZOLE SODIUM 40 MG PO TBEC: 40.0000 mg | DELAYED_RELEASE_TABLET | Freq: Every day | ORAL | Status: DC

## 2014-12-04 MED ORDER — POTASSIUM CHLORIDE IN NACL 20-0.9 MEQ/L-% IV SOLN
INTRAVENOUS | Status: DC
  Administered 2014-12-04 – 2014-12-05 (×2): via INTRAVENOUS
  Filled 2014-12-04 (×4): qty 1000

## 2014-12-04 MED ORDER — 0.9 % SODIUM CHLORIDE (POUR BTL) OPTIME
TOPICAL | Status: DC | PRN
  Administered 2014-12-04 (×3): 1000 mL

## 2014-12-04 MED ORDER — DEXAMETHASONE SODIUM PHOSPHATE 4 MG/ML IJ SOLN
4.0000 mg | Freq: Four times a day (QID) | INTRAMUSCULAR | Status: DC
  Filled 2014-12-04 (×3): qty 1

## 2014-12-04 MED ORDER — DEXAMETHASONE SODIUM PHOSPHATE 4 MG/ML IJ SOLN: 4.0000 mg | Freq: Three times a day (TID) | INTRAMUSCULAR | Status: DC

## 2014-12-04 MED ORDER — HYDROCORTISONE ACETATE 25 MG RE SUPP
25.0000 mg | Freq: Every evening | RECTAL | Status: DC | PRN
Start: 2014-12-04 — End: 2014-12-06
  Filled 2014-12-04: qty 1

## 2014-12-04 MED ORDER — ALBUTEROL 90 MCG/ACT IN AERS: 2.0000 | INHALATION_SPRAY | Freq: Four times a day (QID) | RESPIRATORY_TRACT | Status: DC | PRN

## 2014-12-04 MED ORDER — DOCUSATE SODIUM 100 MG PO CAPS
100.0000 mg | ORAL_CAPSULE | Freq: Two times a day (BID) | ORAL | Status: DC
  Administered 2014-12-04 – 2014-12-06 (×4): 100 mg via ORAL
  Filled 2014-12-04 (×5): qty 1

## 2014-12-04 MED ORDER — LACTATED RINGERS IV SOLN
INTRAVENOUS | Status: DC
  Administered 2014-12-04: 10:00:00 via INTRAVENOUS

## 2014-12-04 MED ORDER — ACETAMINOPHEN 500 MG PO TABS: 1000.0000 mg | ORAL_TABLET | Freq: Four times a day (QID) | ORAL | Status: DC | PRN

## 2014-12-04 MED ORDER — ARTIFICIAL TEARS OP OINT
TOPICAL_OINTMENT | OPHTHALMIC | Status: DC | PRN
  Administered 2014-12-04: 1 via OPHTHALMIC

## 2014-12-04 MED ORDER — ONDANSETRON HCL 4 MG/2ML IJ SOLN
INTRAMUSCULAR | Status: AC
  Filled 2014-12-04: qty 2

## 2014-12-04 MED ORDER — PROMETHAZINE HCL 25 MG PO TABS: 12.5000 mg | ORAL_TABLET | ORAL | Status: DC | PRN

## 2014-12-04 MED ORDER — LACTULOSE 10 GM/15ML PO SOLN
10.0000 g | Freq: Two times a day (BID) | ORAL | Status: DC | PRN
  Filled 2014-12-04: qty 15

## 2014-12-04 MED ORDER — ROCURONIUM BROMIDE 100 MG/10ML IV SOLN
INTRAVENOUS | Status: DC | PRN
  Administered 2014-12-04: 50 mg via INTRAVENOUS

## 2014-12-04 MED ORDER — BUDESONIDE-FORMOTEROL FUMARATE 160-4.5 MCG/ACT IN AERO
2.0000 | INHALATION_SPRAY | Freq: Two times a day (BID) | RESPIRATORY_TRACT | Status: DC
  Administered 2014-12-04 – 2014-12-06 (×4): 2 via RESPIRATORY_TRACT
  Filled 2014-12-04: qty 6

## 2014-12-04 MED ORDER — EPHEDRINE SULFATE 50 MG/ML IJ SOLN
INTRAMUSCULAR | Status: AC
  Filled 2014-12-04: qty 1

## 2014-12-04 MED ORDER — HYDROMORPHONE HCL 1 MG/ML IJ SOLN
INTRAMUSCULAR | Status: AC
  Filled 2014-12-04: qty 1

## 2014-12-04 MED ORDER — HYDROMORPHONE HCL 1 MG/ML IJ SOLN: 0.5000 mg | INTRAMUSCULAR | Status: DC | PRN

## 2014-12-04 MED ORDER — ADULT MULTIVITAMIN W/MINERALS CH
1.0000 | ORAL_TABLET | Freq: Every day | ORAL | Status: DC
  Administered 2014-12-05 – 2014-12-06 (×2): 1 via ORAL
  Filled 2014-12-04 (×2): qty 1

## 2014-12-04 MED ORDER — PROPOFOL 10 MG/ML IV BOLUS
INTRAVENOUS | Status: AC
  Filled 2014-12-04: qty 20

## 2014-12-04 MED ORDER — TAMSULOSIN HCL 0.4 MG PO CAPS
0.4000 mg | ORAL_CAPSULE | Freq: Every day | ORAL | Status: DC
Start: 2014-12-04 — End: 2014-12-06
  Administered 2014-12-04 – 2014-12-05 (×2): 0.4 mg via ORAL
  Filled 2014-12-04 (×3): qty 1

## 2014-12-04 MED ORDER — DEXAMETHASONE SODIUM PHOSPHATE 10 MG/ML IJ SOLN
INTRAMUSCULAR | Status: DC | PRN
  Administered 2014-12-04: 10 mg via INTRAVENOUS

## 2014-12-04 MED ORDER — PANTOPRAZOLE SODIUM 40 MG IV SOLR
40.0000 mg | Freq: Every day | INTRAVENOUS | Status: DC
  Administered 2014-12-04: 40 mg via INTRAVENOUS
  Filled 2014-12-04 (×2): qty 40

## 2014-12-04 MED ORDER — ONDANSETRON HCL 4 MG PO TABS: 4.0000 mg | ORAL_TABLET | ORAL | Status: DC | PRN

## 2014-12-04 MED ORDER — ACETAMINOPHEN 325 MG PO TABS: 650.0000 mg | ORAL_TABLET | ORAL | Status: DC | PRN

## 2014-12-04 MED ORDER — BISACODYL 10 MG RE SUPP: 10.0000 mg | Freq: Every day | RECTAL | Status: DC | PRN

## 2014-12-04 MED ORDER — POLYETHYLENE GLYCOL 3350 17 G PO PACK
17.0000 g | PACK | Freq: Every day | ORAL | Status: DC | PRN
  Filled 2014-12-04: qty 1

## 2014-12-04 MED ORDER — FAMOTIDINE 20 MG PO TABS
20.0000 mg | ORAL_TABLET | Freq: Every day | ORAL | Status: DC
  Administered 2014-12-04 – 2014-12-05 (×2): 20 mg via ORAL
  Filled 2014-12-04 (×3): qty 1

## 2014-12-04 MED ORDER — PHENYLEPHRINE HCL 10 MG/ML IJ SOLN
10.0000 mg | INTRAVENOUS | Status: DC | PRN
  Administered 2014-12-04: 10 ug/min via INTRAVENOUS

## 2014-12-04 MED ORDER — LIDOCAINE-EPINEPHRINE 1 %-1:100000 IJ SOLN
INTRAMUSCULAR | Status: DC | PRN
  Administered 2014-12-04: 5 mL

## 2014-12-04 MED ORDER — HEMOSTATIC AGENTS (NO CHARGE) OPTIME
TOPICAL | Status: DC | PRN
  Administered 2014-12-04: 1 via TOPICAL

## 2014-12-04 MED ORDER — HYDROMORPHONE HCL 1 MG/ML IJ SOLN
0.2500 mg | INTRAMUSCULAR | Status: DC | PRN
  Administered 2014-12-04: 0.5 mg via INTRAVENOUS

## 2014-12-04 MED ORDER — LIDOCAINE HCL 4 % MT SOLN
OROMUCOSAL | Status: DC | PRN
  Administered 2014-12-04: 4 mL via TOPICAL

## 2014-12-04 MED ORDER — LABETALOL HCL 5 MG/ML IV SOLN
10.0000 mg | INTRAVENOUS | Status: DC | PRN
  Administered 2014-12-04: 10 mg via INTRAVENOUS
  Filled 2014-12-04: qty 4

## 2014-12-04 MED ORDER — SUCCINYLCHOLINE CHLORIDE 20 MG/ML IJ SOLN
INTRAMUSCULAR | Status: AC
  Filled 2014-12-04: qty 1

## 2014-12-04 SURGICAL SUPPLY — 88 items
BANDAGE GAUZE 4  KLING STR (GAUZE/BANDAGES/DRESSINGS) IMPLANT
BIT DRILL WIRE PASS 1.3MM (BIT) IMPLANT
BLADE CLIPPER SURG (BLADE) ×4 IMPLANT
BRUSH SCRUB EZ 1% IODOPHOR (MISCELLANEOUS) IMPLANT
BRUSH SCRUB EZ PLAIN DRY (MISCELLANEOUS) IMPLANT
BUR ACORN 6.0 PRECISION (BURR) ×3 IMPLANT
BUR ACORN 6.0MM PRECISION (BURR) ×1
BUR ADDG 1.1 (BURR) IMPLANT
BUR ADDG 1.1MM (BURR)
BUR SPIRAL ROUTER 2.3 (BUR) ×3 IMPLANT
BUR SPIRAL ROUTER 2.3MM (BUR) ×1
CANISTER SUCT 3000ML PPV (MISCELLANEOUS) ×4 IMPLANT
CLIP TI MEDIUM 6 (CLIP) IMPLANT
CONT SPEC 4OZ CLIKSEAL STRL BL (MISCELLANEOUS) ×8 IMPLANT
COVER MAYO STAND STRL (DRAPES) IMPLANT
DECANTER SPIKE VIAL GLASS SM (MISCELLANEOUS) ×4 IMPLANT
DRAIN SNY WOU 7FLT (WOUND CARE) IMPLANT
DRAPE MICROSCOPE LEICA (MISCELLANEOUS) ×8 IMPLANT
DRAPE NEUROLOGICAL W/INCISE (DRAPES) ×4 IMPLANT
DRAPE STERI IOBAN 125X83 (DRAPES) IMPLANT
DRAPE WARM FLUID 44X44 (DRAPE) ×4 IMPLANT
DRILL WIRE PASS 1.3MM (BIT)
DRSG OPSITE 4X5.5 SM (GAUZE/BANDAGES/DRESSINGS) IMPLANT
DRSG OPSITE POSTOP 4X6 (GAUZE/BANDAGES/DRESSINGS) ×4 IMPLANT
DRSG TELFA 3X8 NADH (GAUZE/BANDAGES/DRESSINGS) ×4 IMPLANT
DURAMATRIX ONLAY 2X2 (Neuro Prosthesis/Implant) ×4 IMPLANT
DURAPREP 6ML APPLICATOR 50/CS (WOUND CARE) ×8 IMPLANT
ELECT REM PT RETURN 9FT ADLT (ELECTROSURGICAL) ×4
ELECTRODE REM PT RTRN 9FT ADLT (ELECTROSURGICAL) ×2 IMPLANT
EVACUATOR 1/8 PVC DRAIN (DRAIN) IMPLANT
EVACUATOR SILICONE 100CC (DRAIN) IMPLANT
FORCEPS BIPOLAR SPETZLER 8 1.0 (NEUROSURGERY SUPPLIES) ×4 IMPLANT
GAUZE SPONGE 4X4 12PLY STRL (GAUZE/BANDAGES/DRESSINGS) ×4 IMPLANT
GAUZE SPONGE 4X4 16PLY XRAY LF (GAUZE/BANDAGES/DRESSINGS) IMPLANT
GLOVE BIO SURGEON STRL SZ8 (GLOVE) ×4 IMPLANT
GLOVE BIO SURGEON STRL SZ8.5 (GLOVE) ×4 IMPLANT
GLOVE BIOGEL PI IND STRL 8 (GLOVE) ×2 IMPLANT
GLOVE BIOGEL PI IND STRL 8.5 (GLOVE) ×2 IMPLANT
GLOVE BIOGEL PI INDICATOR 8 (GLOVE) ×2
GLOVE BIOGEL PI INDICATOR 8.5 (GLOVE) ×2
GLOVE ECLIPSE 8.0 STRL XLNG CF (GLOVE) ×8 IMPLANT
GLOVE EXAM NITRILE LRG STRL (GLOVE) IMPLANT
GLOVE EXAM NITRILE MD LF STRL (GLOVE) IMPLANT
GLOVE EXAM NITRILE XL STR (GLOVE) IMPLANT
GLOVE EXAM NITRILE XS STR PU (GLOVE) IMPLANT
GOWN STRL REUS W/ TWL LRG LVL3 (GOWN DISPOSABLE) IMPLANT
GOWN STRL REUS W/ TWL XL LVL3 (GOWN DISPOSABLE) ×4 IMPLANT
GOWN STRL REUS W/TWL 2XL LVL3 (GOWN DISPOSABLE) ×4 IMPLANT
GOWN STRL REUS W/TWL LRG LVL3 (GOWN DISPOSABLE)
GOWN STRL REUS W/TWL XL LVL3 (GOWN DISPOSABLE) ×4
HEMOSTAT POWDER KIT SURGIFOAM (HEMOSTASIS) ×4 IMPLANT
HEMOSTAT SURGICEL 2X14 (HEMOSTASIS) ×4 IMPLANT
KIT BASIN OR (CUSTOM PROCEDURE TRAY) ×4 IMPLANT
KIT ROOM TURNOVER OR (KITS) ×4 IMPLANT
MARKER SKIN DUAL TIP RULER LAB (MISCELLANEOUS) ×8 IMPLANT
MARKER SPHERE PSV REFLC 13MM (MARKER) ×8 IMPLANT
NEEDLE HYPO 25X1 1.5 SAFETY (NEEDLE) ×4 IMPLANT
NS IRRIG 1000ML POUR BTL (IV SOLUTION) ×12 IMPLANT
PACK CRANIOTOMY (CUSTOM PROCEDURE TRAY) ×4 IMPLANT
PAD ARMBOARD 7.5X6 YLW CONV (MISCELLANEOUS) ×8 IMPLANT
PAD EYE OVAL STERILE LF (GAUZE/BANDAGES/DRESSINGS) IMPLANT
PATTIES SURGICAL .25X.25 (GAUZE/BANDAGES/DRESSINGS) IMPLANT
PATTIES SURGICAL .5 X.5 (GAUZE/BANDAGES/DRESSINGS) IMPLANT
PATTIES SURGICAL .5 X3 (DISPOSABLE) IMPLANT
PATTIES SURGICAL 1/4 X 3 (GAUZE/BANDAGES/DRESSINGS) IMPLANT
PATTIES SURGICAL 1X1 (DISPOSABLE) IMPLANT
PIN MAYFIELD SKULL DISP (PIN) ×4 IMPLANT
PLATE 1.5/0.5 13MM BURR HOLE (Plate) ×8 IMPLANT
RUBBERBAND STERILE (MISCELLANEOUS) ×8 IMPLANT
SCREW SELF DRILL HT 1.5/4MM (Screw) ×32 IMPLANT
SPECIMEN JAR SMALL (MISCELLANEOUS) IMPLANT
SPONGE NEURO XRAY DETECT 1X3 (DISPOSABLE) IMPLANT
SPONGE SURGIFOAM ABS GEL 100 (HEMOSTASIS) ×4 IMPLANT
STAPLER SKIN PROX WIDE 3.9 (STAPLE) ×4 IMPLANT
SUT ETHILON 3 0 FSL (SUTURE) IMPLANT
SUT NURALON 4 0 TR CR/8 (SUTURE) ×4 IMPLANT
SUT SILK 2 0 FS (SUTURE) ×4 IMPLANT
SUT VIC AB 2-0 CP2 18 (SUTURE) ×8 IMPLANT
SYR CONTROL 10ML LL (SYRINGE) ×4 IMPLANT
TIP SONASTAR STD MISONIX 1.9 (TRAY / TRAY PROCEDURE) IMPLANT
TOWEL OR 17X24 6PK STRL BLUE (TOWEL DISPOSABLE) ×4 IMPLANT
TOWEL OR 17X26 10 PK STRL BLUE (TOWEL DISPOSABLE) ×4 IMPLANT
TRAY FOLEY CATH 16FRSI W/METER (SET/KITS/TRAYS/PACK) ×4 IMPLANT
TRAY FOLEY W/METER SILVER 14FR (SET/KITS/TRAYS/PACK) IMPLANT
TUBE CONNECTING 12'X1/4 (SUCTIONS) ×1
TUBE CONNECTING 12X1/4 (SUCTIONS) ×3 IMPLANT
UNDERPAD 30X30 INCONTINENT (UNDERPADS AND DIAPERS) ×4 IMPLANT
WATER STERILE IRR 1000ML POUR (IV SOLUTION) ×4 IMPLANT

## 2014-12-04 NOTE — Anesthesia Preprocedure Evaluation (Addendum)
Anesthesia Evaluation  Patient identified by MRN, date of birth, ID band Patient awake    Reviewed: Allergy & Precautions, NPO status , Patient's Chart, lab work & pertinent test results  History of Anesthesia Complications Negative for: history of anesthetic complications  Airway Mallampati: I  TM Distance: >3 FB Neck ROM: Full    Dental  (+) Edentulous Upper, Edentulous Lower, Dental Advisory Given   Pulmonary asthma , pneumonia -, COPDformer smoker,    Pulmonary exam normal       Cardiovascular hypertension, + CAD, + Past MI and + Peripheral Vascular Disease Normal cardiovascular exam+ dysrhythmias + pacemaker  Echo 07/24/2014: - Left ventricle: The cavity size was normal. Wall thickness was increased increased in a pattern of mild to moderate LVH. Systolic function was normal. The estimated ejection fraction was in the range of 60% to 65%. Wall motion was normal; there were no regional wall motion abnormalities. Doppler parameters are consistent with abnormal left ventricular relaxation (grade 1 diastolic dysfunction). - Right ventricle: The cavity size was dilated. Wall thickness was normal. - Right atrium: The atrium was mildly dilated.    Neuro/Psych    GI/Hepatic Neg liver ROS, GERD-  ,  Endo/Other    Renal/GU Renal disease     Musculoskeletal   Abdominal   Peds  Hematology   Anesthesia Other Findings   Reproductive/Obstetrics                          Anesthesia Physical Anesthesia Plan  ASA: III  Anesthesia Plan: General   Post-op Pain Management:    Induction: Intravenous  Airway Management Planned: Oral ETT  Additional Equipment:   Intra-op Plan:   Post-operative Plan: Possible Post-op intubation/ventilation  Informed Consent: I have reviewed the patients History and Physical, chart, labs and discussed the procedure including the risks, benefits and alternatives for the  proposed anesthesia with the patient or authorized representative who has indicated his/her understanding and acceptance.   Dental advisory given  Plan Discussed with: CRNA, Anesthesiologist and Surgeon  Anesthesia Plan Comments:        Anesthesia Quick Evaluation

## 2014-12-04 NOTE — Anesthesia Procedure Notes (Signed)
Procedure Name: Intubation Date/Time: 12/04/2014 11:45 AM Performed by: Izora Gala Pre-anesthesia Checklist: Patient identified, Emergency Drugs available, Suction available and Patient being monitored Patient Re-evaluated:Patient Re-evaluated prior to inductionOxygen Delivery Method: Circle system utilized Preoxygenation: Pre-oxygenation with 100% oxygen Intubation Type: IV induction Laryngoscope Size: Miller and 3 Grade View: Grade I Tube type: Oral Tube size: 8.0 mm Airway Equipment and Method: Stylet and LTA kit utilized Placement Confirmation: ETT inserted through vocal cords under direct vision,  positive ETCO2 and breath sounds checked- equal and bilateral Secured at: 23 cm Tube secured with: Tape Dental Injury: Teeth and Oropharynx as per pre-operative assessment

## 2014-12-04 NOTE — H&P (Signed)
atient ID:   [id number] Patient: Nicholas Schmitt  Date of Birth: 1935-10-30 Visit Type: Office Visit   Date: 11/26/2014 09:00 AM Provider: Marchia Meiers. Vertell Limber MD   This 79 year old male presents for brain tumor.  History of Present Illness: 1.  brain tumor  Patient visits to review brain MRI and discuss radiosurgery and operative options. Stuttering prompted head CT last month, finding brain met.   History: MI 1995, AAA 2009, COPD, CAD, prostate cancer 2005, lung cancer 2015, brain metastasis 2016 Surgical history: Right elbow, umbilical hernia, (both years ago); pacemaker 2015, cardiac stents 2006  Patient is receiving chemotherapy and his recent chest CT scan shows stable disease.  I have reviewed the patient's cranial imaging in our brain tumor conference and per Dr. Tammi Klippel the recommendation is a combination of preoperative stereotactic radiosurgery followed by craniotomy and tumor resection which are likely to give the patient the longest potential survival.  The patient had significant difficulty with speech which sounded like an expressive aphasia initially and that this improved with steroids.        PAST MEDICAL/SURGICAL HISTORY   (Detailed)  Disease/disorder Onset Date Management Date Comments    elbow surgery      pacemaker      Hernia repair    Cancer, brain      Cancer, lung      Cancer, prostate      COPD         PAST MEDICAL HISTORY, SURGICAL HISTORY, FAMILY HISTORY, SOCIAL HISTORY AND REVIEW OF SYSTEMS I have reviewed the patient's past medical, surgical, family and social history as well as the comprehensive review of systems as included on the [facility] history form dated 11/26/2014, which I have signed.  Family History  (Detailed)   SOCIAL HISTORY  (Detailed) Tobacco use reviewed. Preferred language is [place].   Smoking status: Never smoker.  SMOKING STATUS Use Status Type Smoking Status Usage Per Day Years Used  Total Pack Years  no/never  Never smoker       HOME ENVIRONMENT/SAFETY The patient has not fallen in the last year.        MEDICATIONS(added, continued or stopped this visit): Started Medication Directions Instruction Stopped   omeprazole take 2 capsule by oral route  every day before a meal     Xanax 0.25 mg tablet take 1 tablet by oral route  every day     Medication Comment:  Patient could not remember all his medications. Advised to bring a list next visit.   ALLERGIES: Ingredient Reaction Medication Name Comment  NO KNOWN ALLERGIES     No known allergies.   REVIEW OF SYSTEMS System Neg/Pos Details  Constitutional Negative Chills, fatigue, fever, malaise, night sweats, weight gain and weight loss.  ENMT Negative Ear drainage, hearing loss, nasal drainage, otalgia, sinus pressure and sore throat.  Eyes Negative Eye discharge, eye pain and vision changes.  Respiratory Positive Diminished BLL.  Cardio Negative Chest pain, claudication, edema and irregular heartbeat/palpitations.  GI Negative Abdominal pain, blood in stool, change in stool pattern, constipation, decreased appetite, diarrhea, heartburn, nausea and vomiting.  GU Negative Dribbling, dysuria, erectile dysfunction, hematuria, polyuria, slow stream, urinary frequency, urinary incontinence and urinary retention.  Endocrine Negative Cold intolerance, heat intolerance, polydipsia and polyphagia.  Neuro Negative Dizziness, extremity weakness, gait disturbance, headache, memory impairment, numbness in extremity, seizures and tremors.  Psych Negative Anxiety, depression and insomnia.  Integumentary Negative Brittle hair, brittle nails, change in shape/size of mole(s),  hair loss, hirsutism, hives, pruritus, rash and skin lesion.  MS Negative Back pain, joint pain, joint swelling, muscle weakness and neck pain.  Hema/Lymph Negative Easy bleeding, easy bruising and lymphadenopathy.  Allergic/Immuno Negative Contact allergy,  environmental allergies, food allergies and seasonal allergies.  Reproductive Negative Penile discharge and sexual dysfunction.     Vitals Date Temp F BP Pulse Ht In Wt Lb BMI BSA Pain Score  11/26/2014  117/79 69 72 175.8 23.84  4/10     PHYSICAL EXAM General Level of Distress: no acute distress Overall Appearance: normal  Head and Face  Right Left  Fundoscopic Exam:  normal normal    Cardiovascular Cardiac: regular rate and rhythm without murmur  Right Left  Carotid Pulses: normal normal  Respiratory Lungs: clear to auscultation  Neurological Orientation: normal Recent and Remote Memory: normal Attention Span and Concentration:   normal Language: normal Fund of Knowledge: normal  Right Left Sensation: normal normal Upper Extremity Coordination: normal normal  Lower Extremity Coordination: normal normal  Musculoskeletal Gait and Station: normal  Right Left Upper Extremity Muscle Strength: normal normal Lower Extremity Muscle Strength: normal normal Upper Extremity Muscle Tone:  normal normal Lower Extremity Muscle Tone: normal normal  Motor Strength Upper and lower extremity motor strength was tested in the clinically pertinent muscles.     Deep Tendon Reflexes  Right Left Biceps: normal normal Triceps: normal normal Brachiloradialis: normal normal Patellar: normal normal Achilles: normal normal  Cranial Nerves II. Optic Nerve/Visual Fields: normal III. Oculomotor: normal IV. Trochlear: normal V. Trigeminal: normal VI. Abducens: normal VII. Facial: normal VIII. Acoustic/Vestibular: normal IX. Glossopharyngeal: normal X. Vagus: normal XI. Spinal Accessory: normal XII. Hypoglossal: normal  Motor and other Tests Lhermittes: negative Rhomberg: negative Pronator drift: absent     Right Left Hoffman's: normal normal Clonus: normal normal Babinski: normal normal   Additional Findings:  Patient's speech is much better today.  He has no  pronator drift.  Visual fields are full to confrontational testing.    IMPRESSION Left temporal brain metastasis with lung cancer primary and aphasia  Assessment/Plan # Detail Type Description   1. Assessment Lung cancer, primary, with metastasis from lung to other site, right (C34.91).       2. Assessment Brain metastasis (C79.31).       3. Assessment Aphasia (R47.01).         Pain Assessment/Treatment Pain Scale: 4/10. Method: Numeric Pain Intensity Scale. Location: back. Onset: 05/27/2014. Duration: varies. Quality: discomforting. Pain Assessment/Treatment follow-up plan of care: Patient taking medication as prescribed..  Fall Risk Plan The patient has not fallen in the last year.  Plan preoperative stereotactic radiosurgery and craniotomy for tumor resection.  Risks and benefits of surgery were discussed in detail with the patient and he wishes to proceed.             Provider:  Marchia Meiers. Vertell Limber MD  11/26/2014 10:48 AM Dictation edited by: Marchia Meiers. Vertell Limber        Electronically signed by Marchia Meiers Vertell Limber MD on 11/26/2014 10:48 AM

## 2014-12-04 NOTE — Progress Notes (Signed)
Remote pacemaker transmission.   

## 2014-12-04 NOTE — Transfer of Care (Signed)
Immediate Anesthesia Transfer of Care Note  Patient: Nicholas Schmitt  Procedure(s) Performed: Procedure(s) with comments: CRANIOTOMY TUMOR EXCISION, LEFT (N/A) - CRANIOTOMY TUMOR EXCISION APPLICATION OF CRANIAL NAVIGATION  Patient Location: PACU  Anesthesia Type:General  Level of Consciousness: awake, alert , oriented and patient cooperative  Airway & Oxygen Therapy: Patient Spontanous Breathing and Patient connected to face mask oxygen  Post-op Assessment: Report given to RN, Post -op Vital signs reviewed and stable, Patient moving all extremities and Patient moving all extremities X 4  Post vital signs: Reviewed and stable  Last Vitals:  Filed Vitals:   12/04/14 0910  BP: 165/98  Pulse: 71  Temp: 36.1 C  Resp: 16    Complications: No apparent anesthesia complications

## 2014-12-04 NOTE — Interval H&P Note (Signed)
History and Physical Interval Note:  12/04/2014 7:22 AM  Nicholas Schmitt  has presented today for surgery, with the diagnosis of Brain tumor  The various methods of treatment have been discussed with the patient and family. After consideration of risks, benefits and other options for treatment, the patient has consented to  Procedure(s) with comments: CRANIOTOMY TUMOR EXCISION (N/A) - CRANIOTOMY TUMOR EXCISION as a surgical intervention .  The patient's history has been reviewed, patient examined, no change in status, stable for surgery.  I have reviewed the patient's chart and labs.  Questions were answered to the patient's satisfaction.     [name] D

## 2014-12-04 NOTE — Brief Op Note (Signed)
12/04/2014  1:34 PM  PATIENT:  Nicholas Schmitt  79 y.o. 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, posterior left frontal brain metastasis  PROCEDURE:  Procedure(s) with comments: CRANIOTOMY TUMOR EXCISION (N/A) - CRANIOTOMY TUMOR EXCISION APPLICATION OF CRANIAL NAVIGATION with microdissection  SURGEON:  Surgeon(s) and Role:    * [name], MD - Primary    * Newman Pies, MD - Assisting  PHYSICIAN ASSISTANT:   ASSISTANTS: Poteat, RN   ANESTHESIA:   general  EBL:  Total I/O In: 600 [I.V.:600] Out: 150 [Urine:150]  BLOOD ADMINISTERED:none  DRAINS: none   LOCAL MEDICATIONS USED:  LIDOCAINE   SPECIMEN:  Excision  DISPOSITION OF SPECIMEN:  PATHOLOGY  COUNTS:  YES  TOURNIQUET:  * No tourniquets in log *   DICTATION: Patient is 79 year old man with lung cancer. He has had an aphasia and has a left posterior frontal metastasis with significant peritumoral edema.  It was elected to take him to surgery for craniotomy for left frontal brain tumor.  He had a BrainLab MRI for surgical localization of tumor and had preoperative radiosurgery to the mass.  He has a pacemaker, so planning has been with CT scans.  Procedure:  Following smooth intubation, patient was placed in left semi-lateral position with blanket roll.  Head was placed in pins and left frontal scalp was shaved and prepped and draped in usual sterile fashion after BrainLab CT was localized to map tumor location.  Area of planned incision was infiltrated with lidocaine. A linear pre-auricular incision was made and carried through temporalis fascia and muscle to expose calvarium.  Skull flap was elevated exposing the dura directly overlying the brain mass.  Dura was opened.  A corticotomy was created in the frontal lobe and carried to remove the brain metastasis.  The Curve and microscope were  used to confirm extent of tumor resection.  Hemostasis was assured with irrigation and cotton balls.   Hemostasis was assured and the tumor cavity was lined with Surgicell. The dura was covered with Dura Matrix patch graft. The bone flap was replaced with plates, the fascia and galea were closed with 2-0 vicryl sutures and the skin was re approximated with staples.  A sterile occlusive dressing was placed.  Patient was returned to a supine position and taken out of head pins, then extubated in the operating room, having tolerated surgery well.  Counts were correct at the end of the case.  PLAN OF CARE: Admit to inpatient   PATIENT DISPOSITION:  PACU - hemodynamically stable.   Delay start of Pharmacological VTE agent (>24hrs) due to surgical blood loss or risk of bleeding: yes

## 2014-12-04 NOTE — Anesthesia Postprocedure Evaluation (Signed)
Anesthesia Post Note  Patient: Nicholas Schmitt  Procedure(s) Performed: Procedure(s) (LRB): CRANIOTOMY TUMOR EXCISION, LEFT (N/A) APPLICATION OF CRANIAL NAVIGATION  Anesthesia type: general  Patient location: PACU  Post pain: Pain level controlled  Post assessment: Patient's Cardiovascular Status Stable  Last Vitals:  Filed Vitals:   12/04/14 1423  BP:   Pulse:   Temp: 36.5 C  Resp:     Post vital signs: Reviewed and stable  Level of consciousness: sedated  Complications: No apparent anesthesia complications

## 2014-12-04 NOTE — Progress Notes (Signed)
Awake, alert, conversant.  MAEW with good power.  Speech clear and fluent.  Doing well.

## 2014-12-04 NOTE — Care Management Note (Signed)
Case Management Note  Patient Details  Name: Nicholas Schmitt MRN: [id number] Date of Birth: April 26, 1936  Subjective/Objective:   Pt admitted on 12/04/14 s/p craniotomy with brain tumor excision.  PTA, pt resided at home and was independent.                   Action/Plan: Will follow for discharge planning as pt progresses.    Expected Discharge Date:                  Expected Discharge Plan:  [place]  In-House Referral:     Discharge planning Services  CM Consult  Post Acute Care Choice:    Choice offered to:     DME Arranged:    DME Agency:     HH Arranged:    [place] Agency:     Status of Service:  In process, will continue to follow  Medicare Important Message Given:    Date Medicare IM Given:    Medicare IM give by:    Date Additional Medicare IM Given:    Additional Medicare Important Message give by:     If discussed at [place] of Stay Meetings, dates discussed:    Additional Comments:  Reinaldo Raddle, RN, BSN  Trauma/Neuro ICU Case Manager [phone]

## 2014-12-04 NOTE — Op Note (Signed)
12/04/2014  1:34 PM  Nicholas Schmitt:  Nicholas Schmitt  79 y.o. 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, posterior left frontal brain metastasis  PROCEDURE:  Procedure(s) with comments: CRANIOTOMY TUMOR EXCISION (N/A) - CRANIOTOMY TUMOR EXCISION APPLICATION OF CRANIAL NAVIGATION with microdissection  SURGEON:  Surgeon(s) and Role:    * [name], MD - Primary    * Newman Pies, MD - Assisting  PHYSICIAN ASSISTANT:   ASSISTANTS: Poteat, RN   ANESTHESIA:   general  EBL:  Total I/O In: 600 [I.V.:600] Out: 150 [Urine:150]  BLOOD ADMINISTERED:none  DRAINS: none   LOCAL MEDICATIONS USED:  LIDOCAINE   SPECIMEN:  Excision  DISPOSITION OF SPECIMEN:  PATHOLOGY  COUNTS:  YES  TOURNIQUET:  * No tourniquets in log *   DICTATION: Nicholas Schmitt is 79 year old man with lung cancer. Nicholas Schmitt has had an aphasia and has a left posterior frontal metastasis with significant peritumoral edema.  It was elected to take him to surgery for craniotomy for left frontal brain tumor.  Nicholas Schmitt had a BrainLab MRI for surgical localization of tumor and had preoperative radiosurgery to the mass.  Nicholas Schmitt has a pacemaker, so planning has been with CT scans.  Procedure:  Following smooth intubation, Nicholas Schmitt was placed in left semi-lateral position with blanket roll.  Head was placed in pins and left frontal scalp was shaved and prepped and draped in usual sterile fashion after BrainLab CT was localized to map tumor location.  Area of planned incision was infiltrated with lidocaine. A linear pre-auricular incision was made and carried through temporalis fascia and muscle to expose calvarium.  Skull flap was elevated exposing the dura directly overlying the brain mass.  Dura was opened.  A corticotomy was created in the frontal lobe and carried to remove the brain metastasis.  The Curve and microscope were  used to confirm extent of tumor resection.  Hemostasis was assured with irrigation and cotton balls.   Hemostasis was assured and the tumor cavity was lined with Surgicell. The dura was covered with Dura Matrix patch graft. The bone flap was replaced with plates, the fascia and galea were closed with 2-0 vicryl sutures and the skin was re approximated with staples.  A sterile occlusive dressing was placed.  Nicholas Schmitt was returned to a supine position and taken out of head pins, then extubated in the operating room, having tolerated surgery well.  Counts were correct at the end of the case.  PLAN OF CARE: Admit to inpatient   Nicholas Schmitt DISPOSITION:  PACU - hemodynamically stable.   Delay start of Pharmacological VTE agent (>24hrs) due to surgical blood loss or risk of bleeding: yes

## 2014-12-05 ENCOUNTER — Inpatient Hospital Stay (HOSPITAL_COMMUNITY): Payer: Medicare Other

## 2014-12-05 ENCOUNTER — Encounter (HOSPITAL_COMMUNITY): Payer: Self-pay

## 2014-12-05 MED ORDER — DEXAMETHASONE 2 MG PO TABS: 2.0000 mg | ORAL_TABLET | Freq: Two times a day (BID) | ORAL | Status: DC

## 2014-12-05 MED ORDER — IOHEXOL 300 MG/ML  SOLN
75.0000 mL | Freq: Once | INTRAMUSCULAR | Status: AC | PRN
  Administered 2014-12-05: 75 mL via INTRAVENOUS

## 2014-12-05 MED ORDER — PANTOPRAZOLE SODIUM 40 MG PO TBEC
40.0000 mg | DELAYED_RELEASE_TABLET | Freq: Every day | ORAL | Status: DC
  Administered 2014-12-05: 40 mg via ORAL
  Filled 2014-12-05: qty 1

## 2014-12-05 MED ORDER — DEXAMETHASONE 4 MG PO TABS
4.0000 mg | ORAL_TABLET | Freq: Two times a day (BID) | ORAL | Status: DC
Start: 2014-12-05 — End: 2014-12-06
  Administered 2014-12-05 – 2014-12-06 (×3): 4 mg via ORAL
  Filled 2014-12-05 (×3): qty 1

## 2014-12-05 NOTE — Progress Notes (Signed)
Patient ID: Nicholas Schmitt, male   DOB: Aug 09, 1935, 79 y.o.   MRN: [id number] New orders entered from DrStern (Taper Decadron, '4mg'$  bid x3, then '2mg'$  bid), CT with & without contrast(post-op), d/c A-line, mobilize, & may go home this afternoon if doing well. May remove drsg. Bacitracin ointment to incision, open to air if no longer draining.   Verdis Prime RN BSN

## 2014-12-05 NOTE — Progress Notes (Signed)
Pt s/p craniotomy with brain tumor excision on 12/04/14.  PTA, pt resided at home alone.  Pt for possible dc later today.  Met with pt and family members at bedside; pt states son plans to stay with him for a while at dc, while he recovers.  PT evaluation pending.   Please notify case manager if HH/DME needed.    Thanks,  Reinaldo Raddle, RN, BSN  Trauma/Neuro ICU Case Manager [phone]

## 2014-12-05 NOTE — Progress Notes (Signed)
Pt is admitted to 4N31 from 41M. Admission vital sign is stable

## 2014-12-05 NOTE — Care Management Note (Signed)
Case Management Note  Patient Details  Name: Nicholas Schmitt MRN: [id number] Date of Birth: 1935-07-08  Subjective/Objective:      PT evaluation today; recommending SNF vs. Home with 24h care at dc.  Met with pt at bedside; he is upset that he is not discharging home today.  No family at bedside presently.  Bedside nurse states that pt was unable to feed himself at lunch; spilled his tea.                Action/Plan: Spoke with pt's son, Nicholas Schmitt by phone [phone]) to discuss dc plans:  Son states pt will never agree to SNF placement, and that family will provide 24h supervision at dc.  Son states pt had recent home health visits with [facility], and would like to continue with this agency.  Denies any DME needs.    Expected Discharge Date:     12/06/14             Expected Discharge Plan:  [place]  In-House Referral:     Discharge planning Services  CM Consult  Post Acute Care Choice:    Choice offered to:  Adult Children  DME Arranged:    DME Agency:     HH Arranged:  PT, OT, Speech Therapy HH Agency:  [facility]  Status of Service:  Completed, signed off  Medicare Important Message Given:  N/A - LOS <3 / Initial given by admissions Date Medicare IM Given:    Medicare IM give by:    Date Additional Medicare IM Given:    Additional Medicare Important Message give by:     If discussed at [place] of Stay Meetings, dates discussed:    Additional Comments:  Referral to [facility] for [facility] follow up.  Start of care for [facility] care 24-48h post dc date.    Reinaldo Raddle, RN, BSN  Trauma/Neuro ICU Case Manager [phone]

## 2014-12-05 NOTE — Progress Notes (Signed)
OT Cancellation Note  Patient Details Name: JAKING THAYER MRN: [id number] DOB: 06/13/1936   Cancelled Treatment:    Reason Eval/Treat Not Completed: transferring units.  Will reattempt.   Darlina Rumpf [place], OTR/L [phone]  12/05/2014, 5:16 PM

## 2014-12-05 NOTE — Progress Notes (Signed)
PT Cancellation Note  Patient Details Name: Nicholas Schmitt MRN: [id number] DOB: 1936-01-20   Cancelled Treatment:     Patient off the floor to CT, will follow when patient returns   Duncan Dull 12/05/2014, 12:04 PM Alben Deeds, [place] DPT  [phone]

## 2014-12-05 NOTE — Progress Notes (Signed)
Patient in CT for 1200 NV checks

## 2014-12-05 NOTE — Progress Notes (Signed)
Patient eating lunch having difficulty  using the utensils properly. Patients motor skills are in question speech and OTC therapy suggested.

## 2014-12-05 NOTE — Progress Notes (Signed)
Patient ID: Nicholas Schmitt, male   DOB: [birth year], 79 y.o.   MRN: [id number] Pt sitting at EOB, denies pain or discomfort. drsg intact without drainage. DrStern reviewed CT - looks good - no residual tumor. Pt pleased with the report. Pt states he plans to f/u with his "preacher" in two weeks...meaning his oncologist, and his "other preacher" meaning Dr. Vertell Limber for staple removal. When he asks to call his girlfriend, he is unable to state her correct phone number.  Discussed with pt at length re: appropriateness of d/c to home tonight. I called his son, [name], to discuss their availability for pts care.  Pt hopes to go soon, but his intermittent expressive aphasia and the comment by his son that "we can stay with him a while and his girlfriend lives just two doors up" is concerning for appropriate post-craniotomy care/inability to request help if needed.  Discussed above with DrStern. Orders rec'd to transfer to 4N for observation with possibility of d/c to care of family tomorrow.   Verdis Prime RN BSN

## 2014-12-05 NOTE — Progress Notes (Signed)
Subjective: Patient reports "I'm ready to go!"  Objective: Vital signs in last 24 hours: Temp:  [97 F (36.1 C)-98.4 F (36.9 C)] 97.9 F (36.6 C) (06/24 0757) Pulse Rate:  [58-81] 63 (06/24 0600) Resp:  [9-28] 17 (06/24 0600) BP: (109-174)/(55-98) 134/75 mmHg (06/24 0600) SpO2:  [93 %-100 %] 99 % (06/24 0600) Arterial Line BP: (109-190)/(47-127) 143/68 mmHg (06/24 0000) Weight:  [79.011 kg (174 lb 3 oz)] 79.011 kg (174 lb 3 oz) (06/23 0910)  Intake/Output from previous day: 06/23 0701 - 06/24 0700 In: 700 [I.V.:700] Out: 2800 [Urine:2800] Intake/Output this shift:    Alert, conversant, smiling. Denies headache. MAEW. [place]AR[place]. Speech fluent. Drsg intact, without new drainage at site.   Lab Results: No results for input(s): WBC, HGB, HCT, PLT in the last 72 hours. BMET No results for input(s): NA, K, CL, CO2, GLUCOSE, BUN, CREATININE, CALCIUM in the last 72 hours.  Studies/Results: No results found.  Assessment/Plan: Doing well   LOS: 1 day  Continue support.    Verdis Prime 12/05/2014, 8:18 AM

## 2014-12-05 NOTE — Evaluation (Addendum)
Physical Therapy Evaluation Patient Details Name: Nicholas Schmitt MRN: [id number] DOB: Jul 24, 1935 Today's Date: 12/05/2014   History of Present Illness  pt is a 79 y/o male admitted with metastatic tumor to the brain, s/p crani for resection.  Clinical Impression  Pt admitted with/for crani to resect frontal tumor.  Pt currently limited functionally due to the problems listed. ( See problems list.)   Pt will benefit from PT to maximize function and safety in order to get ready for next venue listed below.     Follow Up Recommendations Supervision/Assistance - 24 hour;SNF (HHPT with 24/7 if pt adamantly refuses.)    Equipment Recommendations  Other (comment) (TBA)    Recommendations for Other Services       Precautions / Restrictions Precautions Precautions: Fall      Mobility  Bed Mobility Overal bed mobility: Needs Assistance Bed Mobility: Supine to Sit     Supine to sit: Supervision        Transfers Overall transfer level: Needs assistance   Transfers: Sit to/from Stand Sit to Stand: Min guard         General transfer comment: guard for safety  Ambulation/Gait Ambulation/Gait assistance: Min guard Ambulation Distance (Feet): 240 Feet Assistive device: None (iv pole) Gait Pattern/deviations: Step-through pattern Gait velocity: slower   General Gait Details: mildly unsteady at times when scanning or changing direction.  Stairs            Wheelchair Mobility    Modified Rankin (Stroke Patients Only)       Balance Overall balance assessment: Needs assistance Sitting-balance support: No upper extremity supported Sitting balance-Leahy Scale: Good     Standing balance support: No upper extremity supported Standing balance-Leahy Scale: Fair Standing balance comment: stable standing statically, more unsteady with dynamic balance.             High level balance activites: Backward walking;Direction changes;Turns;Sudden stops;Head  turns High Level Balance Comments: pt became unsteady with transition between task.             Pertinent Vitals/Pain      Home Living Family/patient expects to be discharged to:: Private residence Living Arrangements: Alone Available Help at Discharge: Family;Friend(s);Other (Comment) (family, girlfriend don't seem to understand pt needs 24/7) Type of Home: House Home Access: Stairs to enter Entrance Stairs-Rails: Right Entrance Stairs-Number of Steps: 4 Home Layout: One level Home Equipment: Cane - single point      Prior Function Level of Independence: Independent with assistive device(s)         Comments: uses cane for ambulation     Hand Dominance   Dominant Hand: Right    Extremity/Trunk Assessment   Upper Extremity Assessment: Defer to OT evaluation           Lower Extremity Assessment: Generalized weakness         Communication   Communication: HOH  Cognition Arousal/Alertness: Awake/alert Behavior During Therapy: Flat affect Overall Cognitive Status: Impaired/Different from baseline Area of Impairment: Attention;Following commands;Awareness;Problem solving   Current Attention Level: Sustained   Following Commands: Follows one step commands with increased time;Follows one step commands inconsistently   Awareness: Intellectual Problem Solving: Slow processing;Decreased initiation;Difficulty sequencing      General Comments General comments (skin integrity, edema, etc.): pt having touble understanding a task, or initiating/executing a task   SpO2 at 97% on RA    Exercises        Assessment/Plan    PT Assessment Patient needs continued PT services  PT  Diagnosis Abnormality of gait   PT Problem List Decreased strength;Decreased activity tolerance;Decreased balance;Decreased mobility  PT Treatment Interventions Gait training;Functional mobility training;Therapeutic activities;Balance training;Patient/family education   PT Goals  (Current goals can be found in the Care Plan section) Acute Rehab PT Goals Patient Stated Goal: home PT Goal Formulation: With patient/family Time For Goal Achievement: 12/12/14 Potential to Achieve Goals: Good    Frequency Min 3X/week   Barriers to discharge        Co-evaluation               End of Session   Activity Tolerance: Patient tolerated treatment well Patient left: in bed;Other (comment);with bed alarm set;with call bell/phone within reach (EOB) Nurse Communication: Mobility status         Time: 1349-1430 PT Time Calculation (min) (ACUTE ONLY): 41 min   Charges:   PT Evaluation $Initial PT Evaluation Tier I: 1 Procedure PT Treatments $Gait Training: 8-22 mins $Self Care/Home Management: 8-22   PT G Codes:        Alyla Pietila, Tessie Fass 12/05/2014, 3:17 PM 12/05/2014  Donnella Sham, PT [phone]  (pager)

## 2014-12-06 MED ORDER — DEXAMETHASONE 2 MG PO TABS: 2.0000 mg | ORAL_TABLET | Freq: Two times a day (BID) | ORAL | Status: DC

## 2014-12-06 NOTE — Evaluation (Signed)
Occupational Therapy Evaluation Patient Details Name: Nicholas Schmitt MRN: [id number] DOB: 11-20-1935 Today's Date: 12/06/2014    History of Present Illness pt is a 79 y/o male admitted with metastatic tumor to the brain, s/p crani for resection.   Clinical Impression   Pt with decline in function and safety with ADLs and ADL mobility with decreased strength, balance and coordination. Recommending SNF for short term therapy, however pt refuses and hopes to d/c home today with 24 hour family supervision and assist. Pt with decreased safety and body awareness, difficulty with grip/grasp of items for self feeding, ADLs and had difficulty opening bathroom door and was unaware that hand was still on doorknob when advancing into to bathroom, Fait dynamic balance with cues to control speed of descent onto toilet and to use grab bars. Pt's family present during this session    Follow Up Recommendations  SNF;Supervision/Assistance - 24 hour, pt refsuing SNF and wants to d/c home with family supervision and assist   Equipment Recommendations   none, has all necessary DME at home per family report   Recommendations for Other Services       Precautions / Restrictions Precautions Precautions: Fall Restrictions Weight Bearing Restrictions: No      Mobility Bed Mobility               General bed mobility comments: pt up in recliner  Transfers Overall transfer level: Needs assistance   Transfers: Sit to/from Stand Sit to Stand: Min guard         General transfer comment: guard for safety    Balance   Sitting-balance support: Feet supported;Feet unsupported Sitting balance-Leahy Scale: Good     Standing balance support: Single extremity supported;No upper extremity supported;During functional activity Standing balance-Leahy Scale: Fair                 High Level Balance Comments: pt became unsteady with transition between task.            ADL Overall ADL's  : Needs assistance/impaired   Eating/Feeding Details (indicate cue type and reason): pt spilling items, unable to grasp/grip properly Grooming: Wash/dry hands;Wash/dry face;Min guard;Standing Grooming Details (indicate cue type and reason): decreased  FM/GM coordination with using spigot at sink and grasping paper towels from dispenser Upper Body Bathing: Minimal assitance Upper Body Bathing Details (indicate cue type and reason): decreased coordination/body awareness Lower Body Bathing: Minimal assistance Lower Body Bathing Details (indicate cue type and reason): decreased coordination/body awareness Upper Body Dressing : Minimal assistance Upper Body Dressing Details (indicate cue type and reason): decreased coordination/body awareness Lower Body Dressing: Minimal assistance Lower Body Dressing Details (indicate cue type and reason): decreased coordination/body awareness Toilet Transfer: Min guard;Regular Toilet;Grab bars;Ambulation;Cueing for safety Toilet Transfer Details (indicate cue type and reason): pt unable to properly grasp bathroom dooor handle to opne door and unaware that hand still on door know when ambulating into bathroom Toileting- Clothing Manipulation and Hygiene: Min guard;Sit to/from stand       Functional mobility during ADLs: Min guard;Cueing for safety General ADL Comments: decreased GMC/FMC in UEs, decreased body awareness, spatial relations     Vision  wears glasses, no change from baseline per pt report   Perception Perception Perception Tested?: No   Praxis Praxis Praxis tested?: Not tested    Pertinent Vitals/Pain Pain Assessment: No/denies pain     Hand Dominance Right   Extremity/Trunk Assessment Upper Extremity Assessment Upper Extremity Assessment: Generalized weakness;RUE deficits/detail;LUE deficits/detail RUE Sensation: decreased proprioception RUE  Coordination: decreased fine motor;decreased gross motor           Communication   aphasic   Cognition Arousal/Alertness: Awake/alert Behavior During Therapy: WFL for tasks assessed/performed Overall Cognitive Status: Impaired/Different from baseline Area of Impairment: Attention;Following commands;Awareness;Problem solving   Current Attention Level: Sustained   Following Commands: Follows one step commands with increased time;Follows one step commands inconsistently   Awareness: Intellectual Problem Solving: Slow processing;Decreased initiation;Difficulty sequencing     General Comments   pt pleasant and cooperative, decreased safety awareness                 Home Living Family/patient expects to be discharged to:: Private residence Living Arrangements: Alone Available Help at Discharge: Family;Friend(s);Other (Comment) Type of Home: House Home Access: Stairs to enter [employer] of Steps: 4 Entrance Stairs-Rails: Right Home Layout: One level     Bathroom Shower/Tub: Teacher, early years/pre: Standard     Home Equipment: Cane - single point;Walker - 2 wheels;Shower seat;Bedside commode          Prior Functioning/Environment Level of Independence: Independent with assistive device(s)        Comments: uses cane for ambulation    OT Diagnosis: Generalized weakness;Cognitive deficits;Other (comment) (decreased B UE GMC/FMC)   OT Problem List: Decreased strength;Decreased coordination;Decreased cognition;Decreased safety awareness;Impaired balance (sitting and/or standing);Decreased activity tolerance;Impaired UE functional use   OT Treatment/Interventions: Self-care/ADL training;Neuromuscular education;Therapeutic exercise;DME and/or AE instruction;Patient/family education    OT Goals(Current goals can be found in the care plan section) Acute Rehab OT Goals Patient Stated Goal: home OT Goal Formulation: With patient/family Time For Goal Achievement: 12/13/14 Potential to Achieve Goals: Good ADL Goals Pt Will Perform  Grooming: with set-up;with supervision;standing Pt Will Perform Upper Body Bathing: with min guard assist;with supervision;with set-up;sitting;standing Pt Will Perform Lower Body Bathing: with min guard assist;with set-up;with supervision;sit to/from stand Pt Will Perform Upper Body Dressing: with min guard assist;with supervision;with set-up;sitting;standing Pt Will Perform Lower Body Dressing: with min guard assist;with supervision;with set-up;sit to/from stand Pt Will Transfer to Toilet: with supervision;ambulating Pt Will Perform Toileting - Clothing Manipulation and hygiene: with supervision;sit to/from stand Pt Will Perform Tub/Shower Transfer: with supervision;shower seat Additional ADL Goal #1: pt will perform GMC/FMC tasks to increase coordination and function of B UEs for self feeding, ADLs and functional tasks  OT Frequency: Min 2X/week   Barriers to D/C:    uncertain of level of assist pt actually will have at home, he refuses SNF rehab                     End of Session Nurse Communication: Mobility status;Precautions  Activity Tolerance: Patient tolerated treatment well Patient left: in chair;with chair alarm set;with family/visitor present   Time: 2706-2376 OT Time Calculation (min): 22 min Charges:  OT General Charges $OT Visit: 1 Procedure OT Evaluation $Initial OT Evaluation Tier I: 1 Procedure OT Treatments $Therapeutic Activity: 8-22 mins G-Codes:    Britt Bottom 12/06/2014, 9:52 AM

## 2014-12-06 NOTE — Discharge Summary (Signed)
Physician Discharge Summary  Patient ID: Nicholas Schmitt MRN: [id number] DOB/AGE: [birth year] 77 y.o.  Admit date: 12/04/2014 Discharge date: 12/06/2014  Admission Diagnoses: Brain metastases  Discharge Diagnoses: Same Active Problems:   Metastasis to brain   Discharged Condition: Stable  Hospital Course:  Nicholas Schmitt is a 79 y.o. male electively admitted after undergoing left temporal craniotomy for resection of metastatic tumor. He was monitored in the ICU and subsequently the neuroscience floor postop. He did have a mild aphasia which did improved somewhat. He had good strength. He was ambulating well, and had assistance from family at home.  Treatments: Surgery - Left craniotomy for resection of tumor  Discharge Exam: Blood pressure 114/70, pulse 70, temperature 97.8 F (36.6 C), temperature source Oral, resp. rate 18, height 6' (1.829 m), weight 79.011 kg (174 lb 3 oz), SpO2 97 %. Awake, alert, oriented Speech fluent, mild receptive aphasia CN grossly intact 5/5 BUE/BLE Wound c/d/i  Disposition: 06-Home-Health Care Svc     Medication List    STOP taking these medications        GOODY HEADACHE PO      TAKE these medications        acetaminophen 500 MG tablet  Commonly known as:  TYLENOL  Take 1,000 mg by mouth every 6 (six) hours as needed (pain).     albuterol 90 MCG/ACT inhaler  Commonly known as:  PROVENTIL,VENTOLIN  Inhale 2 puffs into the lungs every 6 (six) hours as needed for wheezing or shortness of breath.     ALPRAZolam 0.25 MG tablet  Commonly known as:  XANAX  Take 1 tablet (0.25 mg total) by mouth at bedtime as needed for anxiety.     budesonide-formoterol 160-4.5 MCG/ACT inhaler  Commonly known as:  SYMBICORT  Inhale 2 puffs into the lungs 2 (two) times daily.     Calcium-Magnesium-Zinc Tabs  Take 1 tablet by mouth daily.     dexamethasone 2 MG tablet  Commonly known as:  DECADRON  Take 1 tablet (2 mg total) by mouth 2 (two)  times daily.  Start taking on:  12/08/2014     EX-LAX 15 MG Chew  Generic drug:  Sennosides  Chew 15 mg by mouth at bedtime.     famotidine 20 MG tablet  Commonly known as:  PEPCID  Take 20 mg by mouth at bedtime.     hydrocortisone 25 MG suppository  Commonly known as:  ANUSOL-HC  Use 1 suppository at bedtime for 14 nights then as needed.     lactulose 10 GM/15ML solution  Commonly known as:  CHRONULAC  Take 15 mLs (10 g total) by mouth 2 (two) times daily as needed for mild constipation.     multivitamin with minerals Tabs tablet  Take 1 tablet by mouth daily.     naproxen sodium 220 MG tablet  Commonly known as:  ANAPROX  Take 440 mg by mouth 2 (two) times daily as needed (pain).     nitroGLYCERIN 0.4 MG SL tablet  Commonly known as:  NITROSTAT  Place 0.4 mg under the tongue every 5 (five) minutes as needed for chest pain (MAX 3 TABLETS).     omeprazole 40 MG capsule  Commonly known as:  PRILOSEC  Take 40 mg by mouth daily before supper.     prochlorperazine 10 MG tablet  Commonly known as:  COMPAZINE  Take 10 mg by mouth every 6 (six) hours as needed for nausea or vomiting.     tamsulosin  0.4 MG Caps capsule  Commonly known as:  FLOMAX  Take 0.4 mg by mouth at bedtime.           Follow-up Information    Follow up with [facility].   Why:  Physical, occupational and speech therapy to follow up with you at home.     Contact information:   [address] [place] [address] [phone]       Follow up with Peggyann Shoals, MD In 3 weeks.   Specialty:  Neurosurgery   Contact information:   [address] [place] [address] [place] [address] [phone]       Signed: Jairo Schmitt 12/06/2014, 10:49 AM

## 2014-12-06 NOTE — Progress Notes (Signed)
Patient [place] home via car with family.  DC instructions and prescriptions given to patient.  Both fully understood.  Vital signs and assessments were stable.

## 2014-12-07 LAB — CUP PACEART REMOTE DEVICE CHECK
Battery Impedance: 135 Ohm
Battery Voltage: 2.79 V
Brady Statistic AP VP Percent: 1 %
Brady Statistic AP VS Percent: 24 %
Brady Statistic AS VS Percent: 74 %
Date Time Interrogation Session: 20160622101553
Lead Channel Impedance Value: 549 Ohm
Lead Channel Pacing Threshold Amplitude: 0.5 V
Lead Channel Pacing Threshold Amplitude: 0.625 V
Lead Channel Pacing Threshold Pulse Width: 0.4 ms
Lead Channel Pacing Threshold Pulse Width: 0.4 ms
Lead Channel Sensing Intrinsic Amplitude: 11.2 mV
Lead Channel Setting Pacing Amplitude: 2 V
Lead Channel Setting Pacing Amplitude: 2.5 V
Lead Channel Setting Pacing Pulse Width: 0.4 ms
MDC IDC MSMT BATTERY REMAINING LONGEVITY: 146 mo
MDC IDC MSMT LEADCHNL RA SENSING INTR AMPL: 1.4 mV
MDC IDC MSMT LEADCHNL RV IMPEDANCE VALUE: 629 Ohm
MDC IDC SET LEADCHNL RV SENSING SENSITIVITY: 5.6 mV
MDC IDC STAT BRADY AS VP PERCENT: 1 %

## 2014-12-10 ENCOUNTER — Encounter: Payer: Self-pay | Admitting: Cardiology

## 2014-12-10 NOTE — Progress Notes (Signed)
  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  ExacTrac registration was performed for each couch angle.  The 81.3% isodose line was prescribed.  6 MV X-rays were delivered in the flattening filter free beam mode.  Narrative: The patient tolerated radiation treatment relatively well.     Plan: The patient has completed radiation treatment. He'll undergo resection of the metastasis.  The patient will return to radiation oncology clinic for routine followup in one month. I advised him to call or return sooner if he has any questions or concerns related to his recovery or treatment. ________________________________  Sheral Apley. Tammi Klippel, M.D.

## 2014-12-17 ENCOUNTER — Other Ambulatory Visit: Payer: Self-pay

## 2014-12-17 DIAGNOSIS — I739 Peripheral vascular disease, unspecified: Secondary | ICD-10-CM

## 2014-12-18 ENCOUNTER — Telehealth: Payer: Self-pay | Admitting: Family Medicine

## 2014-12-18 ENCOUNTER — Telehealth: Payer: Self-pay | Admitting: *Deleted

## 2014-12-18 MED ORDER — HYDROCORTISONE 2.5 % RE CREA: 1.0000 "application " | TOPICAL_CREAM | Freq: Two times a day (BID) | RECTAL | Status: DC

## 2014-12-18 NOTE — Telephone Encounter (Signed)
error 

## 2014-12-18 NOTE — Telephone Encounter (Signed)
Pt had brain surgery and his speech is slow. Pt is having issues with hemorroids and is asking if something can be called in.He has tried preparation h   Pharmacy [facility]

## 2014-12-18 NOTE — Telephone Encounter (Signed)
Anusol HC cream generic used twice daily refill times 3 Instruct  on sitz baths

## 2014-12-18 NOTE — Telephone Encounter (Signed)
[facility] - Client [place] [facility]  Patient Name: Nicholas Schmitt  DOB: 18-Jun-1935    Initial Comment Caller states that has hemorrhoids really bad and wants advice. States there is swelling there    Nurse Assessment  Nurse: Genoveva Ill, RN, Lattie Haw Date/Time (Eastern Time): 12/18/2014 5:08:37 PM  Confirm and document reason for call. If symptomatic, describe symptoms. ---caller states has hemorrhoids and office has called something in for him now; states he is going to get it now and declines triage or need for appt; states he will call back if wanting advice  Has the patient traveled out of the country within the last 30 days? ---Not Applicable  Does the patient require triage? ---Declined Triage  Please document clinical information provided and list any resource used. ---advised to call back if desires treatment advice, triage or desires appt; understanding verbalized     Guidelines    Guideline Title Affirmed Question Affirmed Notes       Final Disposition User

## 2014-12-18 NOTE — Telephone Encounter (Signed)
Called pt regarding Steriods, recording " [employer] customer is unavailable to take your call." Called June WIllard cell, lmovm for pt or June to call office. Message upon call back: Per MD, pt to take steroids every other day, stop sunday

## 2014-12-18 NOTE — Telephone Encounter (Signed)
Patient is aware and Rx sent

## 2014-12-23 ENCOUNTER — Other Ambulatory Visit (HOSPITAL_BASED_OUTPATIENT_CLINIC_OR_DEPARTMENT_OTHER): Payer: Medicare Other

## 2014-12-23 ENCOUNTER — Ambulatory Visit (HOSPITAL_BASED_OUTPATIENT_CLINIC_OR_DEPARTMENT_OTHER): Payer: Medicare Other | Admitting: Internal Medicine

## 2014-12-23 ENCOUNTER — Encounter: Payer: Self-pay | Admitting: Internal Medicine

## 2014-12-23 ENCOUNTER — Encounter: Payer: Medicare Other | Admitting: Nutrition

## 2014-12-23 ENCOUNTER — Telehealth: Payer: Self-pay | Admitting: *Deleted

## 2014-12-23 ENCOUNTER — Ambulatory Visit: Payer: Medicare Other

## 2014-12-23 ENCOUNTER — Encounter: Payer: Self-pay | Admitting: *Deleted

## 2014-12-23 VITALS — BP 143/86 | HR 86 | Temp 98.3°F | Resp 23 | Ht 72.0 in | Wt 173.4 lb

## 2014-12-23 DIAGNOSIS — C7931 Secondary malignant neoplasm of brain: Secondary | ICD-10-CM

## 2014-12-23 DIAGNOSIS — C3491 Malignant neoplasm of unspecified part of right bronchus or lung: Secondary | ICD-10-CM

## 2014-12-23 DIAGNOSIS — Z79899 Other long term (current) drug therapy: Secondary | ICD-10-CM

## 2014-12-23 DIAGNOSIS — F329 Major depressive disorder, single episode, unspecified: Secondary | ICD-10-CM | POA: Diagnosis not present

## 2014-12-23 DIAGNOSIS — C3490 Malignant neoplasm of unspecified part of unspecified bronchus or lung: Secondary | ICD-10-CM

## 2014-12-23 DIAGNOSIS — D61818 Other pancytopenia: Secondary | ICD-10-CM | POA: Diagnosis not present

## 2014-12-23 LAB — CBC WITH DIFFERENTIAL/PLATELET
BASO%: 1 % (ref 0.0–2.0)
Basophils Absolute: 0 10*3/uL (ref 0.0–0.1)
EOS%: 0.8 % (ref 0.0–7.0)
Eosinophils Absolute: 0 10*3/uL (ref 0.0–0.5)
HEMATOCRIT: 35.9 % — AB (ref 38.4–49.9)
HGB: 12.1 g/dL — ABNORMAL LOW (ref 13.0–17.1)
LYMPH%: 19.5 % (ref 14.0–49.0)
MCH: 32.1 pg (ref 27.2–33.4)
MCHC: 33.8 g/dL (ref 32.0–36.0)
MCV: 94.9 fL (ref 79.3–98.0)
MONO#: 0.2 10*3/uL (ref 0.1–0.9)
MONO%: 9 % (ref 0.0–14.0)
NEUT#: 1.3 10*3/uL — ABNORMAL LOW (ref 1.5–6.5)
NEUT%: 69.7 % (ref 39.0–75.0)
PLATELETS: 49 10*3/uL — AB (ref 140–400)
RBC: 3.78 10*6/uL — ABNORMAL LOW (ref 4.20–5.82)
RDW: 16.2 % — ABNORMAL HIGH (ref 11.0–14.6)
WBC: 1.9 10*3/uL — ABNORMAL LOW (ref 4.0–10.3)
lymph#: 0.4 10*3/uL — ABNORMAL LOW (ref 0.9–3.3)

## 2014-12-23 LAB — COMPREHENSIVE METABOLIC PANEL (CC13)
ALK PHOS: 60 U/L (ref 40–150)
ALT: 44 U/L (ref 0–55)
ANION GAP: 9 meq/L (ref 3–11)
AST: 27 U/L (ref 5–34)
Albumin: 3 g/dL — ABNORMAL LOW (ref 3.5–5.0)
BUN: 17.6 mg/dL (ref 7.0–26.0)
CHLORIDE: 105 meq/L (ref 98–109)
CO2: 24 mEq/L (ref 22–29)
Calcium: 9.1 mg/dL (ref 8.4–10.4)
Creatinine: 0.8 mg/dL (ref 0.7–1.3)
EGFR: 88 mL/min/{1.73_m2} — ABNORMAL LOW (ref >90)
GLUCOSE: 84 mg/dL (ref 70–140)
Potassium: 4 mEq/L (ref 3.5–5.1)
SODIUM: 138 meq/L (ref 136–145)
TOTAL PROTEIN: 5.6 g/dL — AB (ref 6.4–8.3)
Total Bilirubin: 1.35 mg/dL — ABNORMAL HIGH (ref 0.20–1.20)

## 2014-12-23 LAB — TSH CHCC: TSH: 4.347 m[IU]/L — AB (ref 0.320–4.118)

## 2014-12-23 MED ORDER — MIRTAZAPINE 30 MG PO TABS: 30.0000 mg | ORAL_TABLET | Freq: Every day | ORAL | Status: DC

## 2014-12-23 NOTE — Progress Notes (Signed)
[place] Suicide Risk Assessment Clinical Social Work  Clinical Social Work was referred by Stanton Kidney, Therapist, sports, patient son shared patient expressing suicidal thoughts and patient became aggressive towards son in exam room. CSW met individually with patient in exam room and with son and patients girlfriend in another exam room.  Security was present at time of visit with family.  Patient's son shared patient's friend passed away two days ago and patient had been very upset, he shared patient put gun to his own head and threatened to kill himself.  Patient's girlfriend reported he has history of "threats", but does not believe he would "follow through".  She reported patient is very "high strung" and he is not taking any psychotropic medications at this time.  CSW and [facility] security strongly advised patient's family remove all guns, knives, and any other harmful objects from the home immediately.  Family agreed and [facility] security discussed strategies for how to remove objects appropriately.  CSW met with patient and medical oncologist in exam room.  Mr. Smithey shared he did not "pull a gun out" and referred to his son as "crazy", "just got out of prison and driving me crazy".  Patient shared he does have guns in his home as he has all his life, but has no thoughts or plans or hurting himself or others.  Patient agreed to referral to psychiatrist to address any symptoms of depression/anxiety.    Patient did not agree to safety contract, as he denied any suicidal ideation.  CSW provided the following information to patient's girlfriend and son to utilize if patient verbalizes suicidal ideation or in case of any mental health emergency.  2) Call 911 or  or 3) Go to [facility] Emergency Department for psychiatric evaluation or 4) Call [facility] mental health crisis line at [phone]  CSW provided contact information to family and will identify appropriate psychiatry referral.  Polo Riley,  MSW, LCSW, OSW-C Clinical Social Worker [place] [phone]

## 2014-12-23 NOTE — Telephone Encounter (Signed)
Son called lmovm states " Dad went off again after I got him home, I had to call 911 after he scratched my face and ear, they told me to get papers from the courthouse for commitment. He told me he hated me and to never come back" Returned call to [place], unable to reach lmovm( son ) to confirm his message was received and given to MD and CSW. Message forward to [place] and MD for review.

## 2014-12-23 NOTE — Progress Notes (Signed)
[facility] [phone]   [phone]  OFFICE PROGRESS NOTE  [name],[name] [name], MD [place] [address]  DIAGNOSIS: Non-small cell carcinoma of lung, stage 4  Primary site: Lung (Right)  Staging method: AJCC 7th Edition  Clinical: Stage IV (T1a, N3, M1b) signed by Curt Bears, MD on 02/01/2014 1:42 PM  Summary: Stage IV (T1a, N3, M1b) Prostate cancer  Primary site: Prostate  Clinical: Stage I (T1c, NX, M0) signed by Wyatt Portela, MD on 08/06/2013 1:59 PM  Summary: Stage I (T1c, NX, M0)  PRIOR THERAPY:  1) Systemic chemotherapy with carboplatin for an AUC of 5 and Alimta 500 mg per meter squared given every 3 weeks. Status post 6 cycles, last dose was given 05/20/2014 discontinued secondary to disease progression. 2) palliative radiotherapy to the right lung and lumbar spines under the care of Dr. Sondra Come. 3) status post craniotomy with tumor excision under the care of Dr. Vertell Limber on 12/04/2014.  CURRENT THERAPY: Immunotherapy with Ketruda (pembrolizumab) 2 MG/KG every 3 weeks. First dose 07/29/2014. He has positive PDL 1 expression (90%). Status post 6 cycles and his treatment is currently on hold secondary to the recent metastatic brain lesion and surgery.  INTERVAL HISTORY: Nicholas Schmitt 79 y.o. male returns to the clinic today for follow-up visit accompanied by his wife and son. His immunotherapy treatment has been on hold for the last few weeks after the patient was found to have metastatic brain lesions and he underwent craniotomy with resection of the brain lesion under the care of Dr. Vertell Limber on 12/04/2014. He is recovering well from his surgery. His family has been concerning about his bad temper recently and he proved a gun the other day threatening to kill himself. During his visit today the patient has an angery argument with his son and yelled at him to get out of the room. The patient has been depressed  because of his recent dose of her good friend this weekend. Security was called but the patient did cooperate with the rest of the visit and was very calm. He is currently on a tapering dose of Decadron 2 mg by mouth daily. He is feeling fine today with no specific complaints except for mild fatigue. He denied having any significant fever or chills, no nausea or vomiting. The patient denied having any significant chest pain, shortness of breath except with exertion, cough or hemoptysis. He is here for evaluation and discussion of his treatment options.  MEDICAL HISTORY: Past Medical History  Diagnosis Date  . CAD (coronary artery disease)     positive stress test in 2006 led to left heart cath (8/06) showing 95% prox RCA, 90% CFX, and 90% mLAD.  patient had a cypher DES to all 3 lesions  . Hyperlipidemia   . Hearing loss   . AAA (abdominal aortic aneurysm) 11/09    3.2 cm   . Chronic renal insufficiency   . History of radiation therapy 03/08/04- 04/08/04    prostate 4600 cGy in 3 fractions, radioactive seed implant 05/04/04  . Myocardial infarction 1995  . Pacemaker 2014  . Dysrhythmia   . Shortness of breath     exertion  . Asthma     bronchial  . COPD (chronic obstructive pulmonary disease)   . Radiation Jan.11-Jan 29/2016    Right central chest 35 gray in 14 fractions  . Radiation Jan.2016    35 gray in 14 fx lower lumbar/upper sacrum  .  Diverticulosis   . Hemorrhoids   . Cancer 2015    prostate   . Prostate cancer 2005    gleason 7  . Skin cancer     ear  . Lung cancer   . Pneumonia     hx  . GERD (gastroesophageal reflux disease)   . Hypertension     no med now for 3-4 months    ALLERGIES:  is allergic to oxycodone hcl.  MEDICATIONS:  Current Outpatient Prescriptions  Medication Sig Dispense Refill  . acetaminophen (TYLENOL) 500 MG tablet Take 1,000 mg by mouth every 6 (six) hours as needed (pain).     [name] albuterol (PROVENTIL,VENTOLIN) 90 MCG/ACT inhaler Inhale 2  puffs into the lungs every 6 (six) hours as needed for wheezing or shortness of breath.    . ALPRAZolam (XANAX) 0.25 MG tablet Take 1 tablet (0.25 mg total) by mouth at bedtime as needed for anxiety. (Patient taking differently: Take 0.25 mg by mouth at bedtime. ) 30 tablet 0  . budesonide-formoterol (SYMBICORT) 160-4.5 MCG/ACT inhaler Inhale 2 puffs into the lungs 2 (two) times daily. (Patient taking differently: Inhale 1 puff into the lungs 2 (two) times daily. )    . dexamethasone (DECADRON) 2 MG tablet Take 1 tablet (2 mg total) by mouth 2 (two) times daily. 30 tablet 0  . famotidine (PEPCID) 20 MG tablet Take 20 mg by mouth at bedtime.    . hydrocortisone (ANUSOL-HC) 2.5 % rectal cream Place 1 application rectally 2 (two) times daily. 30 g 3  . hydrocortisone (ANUSOL-HC) 25 MG suppository Use 1 suppository at bedtime for 14 nights then as needed. (Patient taking differently: Place 25 mg rectally at bedtime as needed for hemorrhoids or itching. ) 14 suppository 3  . lactulose (CHRONULAC) 10 GM/15ML solution Take 15 mLs (10 g total) by mouth 2 (two) times daily as needed for mild constipation. 473 mL 1  . Multiple Minerals (CALCIUM-MAGNESIUM-ZINC) TABS Take 1 tablet by mouth daily.    . Multiple Vitamin (MULTIVITAMIN WITH MINERALS) TABS tablet Take 1 tablet by mouth daily.    . naproxen sodium (ANAPROX) 220 MG tablet Take 440 mg by mouth 2 (two) times daily as needed (pain).     . nitroGLYCERIN (NITROSTAT) 0.4 MG SL tablet Place 0.4 mg under the tongue every 5 (five) minutes as needed for chest pain (MAX 3 TABLETS).    [name] omeprazole (PRILOSEC) 40 MG capsule Take 40 mg by mouth daily before supper.   0  . prochlorperazine (COMPAZINE) 10 MG tablet Take 10 mg by mouth every 6 (six) hours as needed for nausea or vomiting.    . Sennosides (EX-LAX) 15 MG CHEW Chew 15 mg by mouth at bedtime.    . tamsulosin (FLOMAX) 0.4 MG CAPS capsule Take 0.4 mg by mouth at bedtime.   2   No current  facility-administered medications for this visit.   Facility-Administered Medications Ordered in Other Visits  Medication Dose Route Frequency Provider Last Rate Last Dose  . heparin lock flush 100 unit/mL  500 Units Intracatheter Daily PRN Adrena E Johnson, PA-C      . sodium chloride 0.9 % injection 10 mL  10 mL Intracatheter PRN Adrena E Johnson, PA-C      . sodium chloride 0.9 % injection 3 mL  3 mL Intracatheter PRN Carlton Adam, PA-C        SURGICAL HISTORY:  Past Surgical History  Procedure Laterality Date  . Ptca  2006  . Coronary stent  placement    . Permanent pacemaker insertion Bilateral 02/26/13    MDT Adapta L implanted by Dr Rayann Heman for sick sinus syndrome  . Radioactive seed implant  05/04/2004    8000 cGy, Dr Danny Lawless Dr Reece Agar  . Elbow surgery Bilateral 1973  . Coronary angioplasty    . Mediastinoscopy N/A 01/28/2014    Procedure: MEDIASTINOSCOPY;  Surgeon: Melrose Nakayama, MD;  Location: [facility];  Service: Thoracic;  Laterality: N/A;  . Permanent pacemaker insertion N/A 02/26/2013    Procedure: PERMANENT PACEMAKER INSERTION;  Surgeon: [name], MD;  Location: [facility];  Service: Cardiovascular;  Laterality: N/A;  . Insert / replace / remove pacemaker      not removed  . Craniotomy N/A 12/04/2014    Procedure: CRANIOTOMY TUMOR EXCISION, LEFT;  Surgeon: [name], MD;  Location: [facility];  Service: Neurosurgery;  Laterality: N/A;  CRANIOTOMY TUMOR EXCISION  . Application of cranial navigation  12/04/2014    Procedure: APPLICATION OF CRANIAL NAVIGATION;  Surgeon: [name], MD;  Location: [facility];  Service: Neurosurgery;;    REVIEW OF SYSTEMS:  Constitutional: positive for fatigue and weight loss Eyes: negative Ears, nose, mouth, throat, and face: negative Respiratory: positive for dyspnea on exertion Cardiovascular: negative Gastrointestinal: negative Genitourinary:negative Integument/breast: negative Hematologic/lymphatic:  negative Musculoskeletal:negative Neurological: positive for weakness Behavioral/Psych: positive for aggressive behavior, bad mood and depression Endocrine: negative Allergic/Immunologic: negative   PHYSICAL EXAMINATION: General appearance: alert, cooperative, fatigued and no distress Head: Normocephalic, without obvious abnormality, atraumatic Neck: no adenopathy, no carotid bruit, supple, symmetrical, trachea midline and thyroid not enlarged, symmetric, no tenderness/mass/nodules Lymph nodes: Cervical, supraclavicular, and axillary nodes normal. Resp: clear to auscultation bilaterally Back: symmetric, no curvature. ROM normal. No CVA tenderness. Cardio: regular rate and rhythm, S1, S2 normal, no murmur, click, rub or gallop GI: soft, non-tender; bowel sounds normal; no masses,  no organomegaly Extremities: extremities normal, atraumatic, no cyanosis or edema Neurologic: Alert and oriented X 3, normal strength and tone. Normal symmetric reflexes. Normal coordination and gait  ECOG PERFORMANCE STATUS: 1 - Symptomatic but completely ambulatory  Blood pressure 143/86, pulse 86, temperature 98.3 F (36.8 C), temperature source Oral, resp. rate 23, height 6' (1.829 m), weight 173 lb 6.4 oz (78.654 kg), SpO2 98 %.  LABORATORY DATA: Lab Results  Component Value Date   WBC 1.9* 12/23/2014   HGB 12.1* 12/23/2014   HCT 35.9* 12/23/2014   MCV 94.9 12/23/2014   PLT 49* 12/23/2014      Chemistry      Component Value Date/Time   NA 135 11/28/2014 1353   NA 141 11/11/2014 0858   K 4.8 11/28/2014 1353   K 4.1 11/11/2014 0858   CL 101 11/28/2014 1353   CO2 25 11/28/2014 1353   CO2 25 11/11/2014 0858   BUN 26* 11/28/2014 1353   BUN 12.5 11/11/2014 0858   CREATININE 0.73 11/28/2014 1353   CREATININE 0.8 11/11/2014 0858      Component Value Date/Time   CALCIUM 9.6 11/28/2014 1353   CALCIUM 9.0 11/11/2014 0858   ALKPHOS 107 11/11/2014 0858   ALKPHOS 66 07/21/2014 1821   AST 19  11/11/2014 0858   AST 43* 07/21/2014 1821   ALT 11 11/11/2014 0858   ALT 37 07/21/2014 1821   BILITOT 0.73 11/11/2014 0858   BILITOT 1.2 07/21/2014 1821       RADIOGRAPHIC STUDIES: Ct Head W Wo Contrast  12/05/2014   CLINICAL DATA:  Craniotomy and tumor excision 12/04/2014. Postop  evaluation.  EXAM: CT HEAD WITHOUT AND WITH CONTRAST  TECHNIQUE: Contiguous axial images were obtained from the base of the skull through the vertex without and with intravenous contrast  CONTRAST:  67m OMNIPAQUE IOHEXOL 300 MG/ML  SOLN  COMPARISON:  11/26/2014  FINDINGS: Skull and Sinuses:Unremarkable appearance of interval left parietal craniotomy.  Orbits: No acute abnormality.  Brain: Interval resection of the posterior left frontal brain mass. Gas is present in the surgical cavity. When accounting for high-density blood products in the dependent cavity, there is no postoperative enhancement. Small subarachnoid hemorrhage seen in the left insula. Stable extensive edema at the operative site - predominantly vasogenic although there could be some cortical involvement near the corticotomy.  No evidence of large vessel infarct, hydrocephalus, or shift.  IMPRESSION: 1. Left posterior frontal metastasis resection with no residual enhancement. 2. Small surgical cavity and regional subarachnoid hemorrhage.   Electronically Signed   By: JMonte FantasiaM.D.   On: 12/05/2014 12:48   Ct Head W Wo Contrast  11/26/2014   CLINICAL DATA:  79year old male with metastatic lung cancer. Neurosurgical and stereotactic radio surgical treatment planning. Subsequent encounter.  EXAM: CT HEAD WITHOUT AND WITH CONTRAST  TECHNIQUE: Contiguous axial images were obtained from the base of the skull through the vertex without and with intravenous contrast  CONTRAST:  1049mOMNIPAQUE IOHEXOL 300 MG/ML  SOLN  COMPARISON:  Head CT without and with contrast 11/17/2014, and earlier  FINDINGS: No acute or suspicious calvarial lesion identified. Dural  calcifications. Mild paranasal sinus mucosal thickening. Mastoids and tympanic cavities are clear. Orbit and scalp soft tissues appear within normal limits.  Calcified atherosclerosis at the skull base. Nearly 2 cm ring-enhancing metastasis in the left superior temporal lobe is re- identified on series 4, image 101. Surrounding vasogenic edema is stable to mildly regressed. Edema tracks into the posterior limbs of the deep white matter capsules as before. No midline shift. No ventriculomegaly. Basilar cisterns remain patent.  Stable gray-white matter differentiation elsewhere. No other cerebral edema identified. No other abnormal intracranial enhancement identified. Major intracranial vascular structures are enhancing.  IMPRESSION: Study for stereotactic treatment planning. Mild regression of edema and mass effect, otherwise stable superior left temporal lobe/operculum metastasis.   Electronically Signed   By: H Genevie [name].D.   On: 11/26/2014 15:48   Ct Chest W Contrast  11/25/2014   CLINICAL DATA:  Stage IV small cell carcinoma of the lung.  EXAM: CT CHEST, ABDOMEN, AND PELVIS WITH CONTRAST  TECHNIQUE: Multidetector CT imaging of the chest, abdomen and pelvis was performed following the standard protocol during bolus administration of intravenous contrast.  CONTRAST:  10041mMNIPAQUE IOHEXOL 300 MG/ML  SOLN  COMPARISON:  09/26/2014  FINDINGS: CT CHEST FINDINGS  Mediastinum/Nodes: Tortuous branch vasculature. Stable soft tissue density in the right upper mediastinum, to the left of the right brachiocephalic vein and SVC, probably therapy related, but with narrowing of the left brachiocephalic vein as it extends towards the SVC. Collateral venous structures in the mediastinum noted. The right paratracheal soft tissue thickening is proximally 1.5 cm, similar to prior.  Coronary, aortic arch, and branch vessel atherosclerotic vascular disease. Mildly distended and contrast filled esophagus.  Lungs/Pleura: Small right  pleural effusion, reduced from prior. Stable paramediastinal and perihilar densities on the right, likely from radiation pneumonitis/ fibrosis. Volume loss, right upper lobe. Left lung remains clear. Small Bochdalek's hernia contains adipose tissue.  Musculoskeletal: The previously seen lucency posterolaterally in the right fifth rib is less sharply defined today,  on image 16 series 5.  CT ABDOMEN PELVIS FINDINGS  Hepatobiliary: Stable hepatic cysts.  Pancreas: Unremarkable  Spleen: Unremarkable  Adrenals/Urinary Tract: Slight right adrenal nodularity without overt mass. Stable bilateral hypodense lesions compatible with cysts.  Stomach/Bowel: Sigmoid diverticulosis without active diverticulitis.  Vascular/Lymphatic: Aortoiliac atherosclerotic vascular disease. Fusiform infrarenal abdominal aortic aneurysm, 3.5 cm transverse by 3.5 cm anterior - posterior, extending to the bifurcation. Today' s exam does not assess well for iliac DVT due to early contrast phase.  Reproductive: Seed implants in the prostate gland.  Other: No supplemental non-categorized findings.  Musculoskeletal: Chronic right sacral alar fracture, thought to be pathologic, but with indistinctness of the underlying lesion and some new calcifications/ossification in the previously lytic lesion. There is also increase calcification along the right L5 vertebral body lesion.  Endplate sclerosis at K9-X8 with loss of intervertebral disc height at all lumbar vertebral levels, and left foraminal stenosis at L5-S1 due to spurring.  IMPRESSION: 1. Essentially stable findings of radiation fibrosis/pneumonitis in the right lung, with stable soft tissue density in the right mediastinum, but without individually discernible adenopathy. 2. Probable healing response in the bony lesions, including the small right fifth rib lesion, the right S1 metastatic lesion, and the right L5 lesion, leading to new calcification and reduce conspicuity of these lesions. There  continues to be a right eccentric pathologic fracture through the right sacral lesion. 3. Atherosclerosis. 3.5 cm infrarenal abdominal aortic aneurysm. Recommend followup by [place] in 2 years. This recommendation follows ACR consensus guidelines: White Paper of the ACR Incidental Findings Committee II on Vascular Findings. J Am Coll Radiol 2013; 10:789-794. 4. The distended contrast filled esophagus, possibly from dysmotility or reflux. 5. Bochdalek's hernia contains adipose tissue. 6. Further reduction in conspicuity of the right adrenal mass, currently manifesting only is minimal nodularity of the adrenal gland.   Electronically Signed   By: Van Clines M.D.   On: 11/25/2014 17:28   Ct Abdomen Pelvis W Contrast  11/25/2014   CLINICAL DATA:  Stage IV small cell carcinoma of the lung.  EXAM: CT CHEST, ABDOMEN, AND PELVIS WITH CONTRAST  TECHNIQUE: Multidetector CT imaging of the chest, abdomen and pelvis was performed following the standard protocol during bolus administration of intravenous contrast.  CONTRAST:  139m OMNIPAQUE IOHEXOL 300 MG/ML  SOLN  COMPARISON:  09/26/2014  FINDINGS: CT CHEST FINDINGS  Mediastinum/Nodes: Tortuous branch vasculature. Stable soft tissue density in the right upper mediastinum, to the left of the right brachiocephalic vein and SVC, probably therapy related, but with narrowing of the left brachiocephalic vein as it extends towards the SVC. Collateral venous structures in the mediastinum noted. The right paratracheal soft tissue thickening is proximally 1.5 cm, similar to prior.  Coronary, aortic arch, and branch vessel atherosclerotic vascular disease. Mildly distended and contrast filled esophagus.  Lungs/Pleura: Small right pleural effusion, reduced from prior. Stable paramediastinal and perihilar densities on the right, likely from radiation pneumonitis/ fibrosis. Volume loss, right upper lobe. Left lung remains clear. Small Bochdalek's hernia contains adipose tissue.   Musculoskeletal: The previously seen lucency posterolaterally in the right fifth rib is less sharply defined today, on image 16 series 5.  CT ABDOMEN PELVIS FINDINGS  Hepatobiliary: Stable hepatic cysts.  Pancreas: Unremarkable  Spleen: Unremarkable  Adrenals/Urinary Tract: Slight right adrenal nodularity without overt mass. Stable bilateral hypodense lesions compatible with cysts.  Stomach/Bowel: Sigmoid diverticulosis without active diverticulitis.  Vascular/Lymphatic: Aortoiliac atherosclerotic vascular disease. Fusiform infrarenal abdominal aortic aneurysm, 3.5 cm transverse by 3.5 cm anterior -  posterior, extending to the bifurcation. Today' s exam does not assess well for iliac DVT due to early contrast phase.  Reproductive: Seed implants in the prostate gland.  Other: No supplemental non-categorized findings.  Musculoskeletal: Chronic right sacral alar fracture, thought to be pathologic, but with indistinctness of the underlying lesion and some new calcifications/ossification in the previously lytic lesion. There is also increase calcification along the right L5 vertebral body lesion.  Endplate sclerosis at Z1-I9 with loss of intervertebral disc height at all lumbar vertebral levels, and left foraminal stenosis at L5-S1 due to spurring.  IMPRESSION: 1. Essentially stable findings of radiation fibrosis/pneumonitis in the right lung, with stable soft tissue density in the right mediastinum, but without individually discernible adenopathy. 2. Probable healing response in the bony lesions, including the small right fifth rib lesion, the right S1 metastatic lesion, and the right L5 lesion, leading to new calcification and reduce conspicuity of these lesions. There continues to be a right eccentric pathologic fracture through the right sacral lesion. 3. Atherosclerosis. 3.5 cm infrarenal abdominal aortic aneurysm. Recommend followup by [place] in 2 years. This recommendation follows ACR consensus guidelines: White Paper  of the ACR Incidental Findings Committee II on Vascular Findings. J Am Coll Radiol 2013; 10:789-794. 4. The distended contrast filled esophagus, possibly from dysmotility or reflux. 5. Bochdalek's hernia contains adipose tissue. 6. Further reduction in conspicuity of the right adrenal mass, currently manifesting only is minimal nodularity of the adrenal gland.   Electronically Signed   By: Van Clines M.D.   On: 11/25/2014 17:28    ASSESSMENT AND PLAN: this is a very pleasant 79 years old white male with stage IV non-small cell lung cancer, adenocarcinoma with positive PDL 1 expression, completed systemic chemotherapy with carboplatin and Alimta status post 6 cycles and tolerated his treatment fairly well.  He is currently on treatment with immunotherapy with Ketruda (pembrolizumab) status post 6 cycles with stable disease in the chest, abdomen and pelvis. Unfortunately the patient was found to have metastatic brain lesions and he underwent craniotomy with resection of the brain tumor. He is recovering slowly from his recent surgery. He has pancytopenia most likely secondary to recent surgery and medications. I recommended for the patient to continue on observation for now for 3 more weeks to give him more time to recover from the recent surgery. For the patient, I will start the patient on Remeron 30 mg by mouth daily at bedtime and we referred the patient to psychiatry for evaluation of his condition. He strongly declined having any suicidal or homicidal ideation. We also advised the family to keep his guns away from his reach. He was advised to call if he has any concerning symptoms in the interval. The patient was advised to call immediately if he has any concerning symptoms in the interval. The patient voices understanding of current disease status and treatment options and is in agreement with the current care plan.  All questions were answered. The patient knows to call the clinic with any  problems, questions or concerns. We can certainly see the patient much sooner if necessary.  Disclaimer: This note was dictated with voice recognition software. Similar sounding words can inadvertently be transcribed and may not be corrected upon review.

## 2014-12-23 NOTE — Progress Notes (Signed)
During nurse asking pt follow up questions, pt expressed some depression, recent death of good friend on 08/29/22. RN asked pt if recent attempt at suicide or thought of hurting self or others. Pt states " no". Son and girlfriend in room Son asked pt to " tell the truth" Pt became angry telling son to get out of my damn house, pointing at son, got up out of chair yelling to get out." Pt then began to exit room and continue to yell at son to get out. RN approached pt requesting pt not to leave, pt returned to room continued to yell at son. RN notified security, social worker called down to speak with pt. RN returned to pt room, pt was sitting calmly in chair, discussed with pt, MD would like to speak with him. Pt states " I don't want them ( girlfriend and son) in here.  Pt agreed he would calmly speak with MD and answer questions;  again stated he did not want " them" in the room.

## 2014-12-23 NOTE — Telephone Encounter (Signed)
Per staff message and POF I have scheduled appts. Advised scheduler of appts. JMW  

## 2014-12-25 ENCOUNTER — Telehealth: Payer: Self-pay | Admitting: Medical Oncology

## 2014-12-25 DIAGNOSIS — C3490 Malignant neoplasm of unspecified part of unspecified bronchus or lung: Secondary | ICD-10-CM

## 2014-12-25 MED ORDER — ALPRAZOLAM 0.25 MG PO TABS: 0.2500 mg | ORAL_TABLET | Freq: Every evening | ORAL | Status: DC | PRN

## 2014-12-25 NOTE — Telephone Encounter (Signed)
xanax refill called in.

## 2014-12-25 NOTE — Telephone Encounter (Signed)
Nicholas Schmitt has not checked on pt today . She will go by there tonight and bring him his meds.  I called pt - I told him Nicholas Schmitt will bring his rx today. I offered f/u with psychiatrist and he stated " Not right now, I 'm feeling pretty good". He asked about moving up his appt with Nicholas Schmitt , I told him I will try. I was unable to reach anyone at Nicholas Schmitt office.[name]

## 2014-12-25 NOTE — Telephone Encounter (Signed)
-----   Message from Randolm Idol, RN sent at 12/23/2014  4:55 PM EDT ----- 1st doxil FS

## 2014-12-25 NOTE — Telephone Encounter (Signed)
Pt asked June  about his meds -was antidepressant called in and pt told her he needs refill for xanax. I told her remeron was sent to [facility]

## 2014-12-26 ENCOUNTER — Telehealth: Payer: Self-pay | Admitting: Medical Oncology

## 2014-12-26 NOTE — Telephone Encounter (Addendum)
June called and concerned that pt is "out of it' -She clarified that he has slept all day . She gave him his first dose of remeron last night and xanax at same time. I told her to separate the drugs and only give the xanax hs if needed and to give  the  remeron  An hour later at hs. I told her she can cut xanax in half tonight if he needs it.

## 2014-12-29 ENCOUNTER — Encounter: Payer: Self-pay | Admitting: Family Medicine

## 2014-12-29 ENCOUNTER — Telehealth: Payer: Self-pay | Admitting: *Deleted

## 2014-12-29 ENCOUNTER — Ambulatory Visit (INDEPENDENT_AMBULATORY_CARE_PROVIDER_SITE_OTHER): Payer: Medicare Other | Admitting: Family Medicine

## 2014-12-29 VITALS — BP 104/70 | HR 105 | Temp 98.1°F | Ht 72.0 in | Wt 173.0 lb

## 2014-12-29 DIAGNOSIS — L989 Disorder of the skin and subcutaneous tissue, unspecified: Secondary | ICD-10-CM

## 2014-12-29 LAB — CBC WITH DIFFERENTIAL/PLATELET
BASOS ABS: 0 10*3/uL (ref 0.0–0.1)
BASOS PCT: 0.4 % (ref 0.0–3.0)
EOS PCT: 1 % (ref 0.0–5.0)
Eosinophils Absolute: 0 10*3/uL (ref 0.0–0.7)
HEMATOCRIT: 31.3 % — AB (ref 39.0–52.0)
Hemoglobin: 10.6 g/dL — ABNORMAL LOW (ref 13.0–17.0)
LYMPHS ABS: 0.8 10*3/uL (ref 0.7–4.0)
Lymphocytes Relative: 38.1 % (ref 12.0–46.0)
MCHC: 34 g/dL (ref 30.0–36.0)
MCV: 93.6 fl (ref 78.0–100.0)
MONO ABS: 0.3 10*3/uL (ref 0.1–1.0)
Monocytes Relative: 14.8 % — ABNORMAL HIGH (ref 3.0–12.0)
NEUTROS PCT: 45.7 % (ref 43.0–77.0)
Neutro Abs: 1 10*3/uL — ABNORMAL LOW (ref 1.4–7.7)
Platelets: 59 10*3/uL — ABNORMAL LOW (ref 150.0–400.0)
RBC: 3.35 Mil/uL — AB (ref 4.22–5.81)
RDW: 16.4 % — ABNORMAL HIGH (ref 11.5–15.5)
WBC: 2.2 10*3/uL — ABNORMAL LOW (ref 4.0–10.5)

## 2014-12-29 MED ORDER — MUPIROCIN 2 % EX OINT: 1.0000 "application " | TOPICAL_OINTMENT | Freq: Two times a day (BID) | CUTANEOUS | Status: DC

## 2014-12-29 NOTE — Telephone Encounter (Signed)
Nicholas Schmitt called reporting he's been "watching a small place on penis for about two weeks.  It's a sore.  It's smaller than a dime.  It hurts when I pull my britches up.  I've been putting salve (neosporin) on it."  Denies any open red drainage or pus.  Advised he call PCP and keep area clean, dry.  Reports Dr. Sherren Mocha is away for three months.  Advised an on call provider may be covering Dr. Honor Junes patients.

## 2014-12-29 NOTE — Progress Notes (Signed)
Pre visit review using our clinic review tool, if applicable. No additional management support is needed unless otherwise documented below in the visit note. 

## 2014-12-29 NOTE — Progress Notes (Signed)
   Subjective:    Patient ID: Nicholas Schmitt, male    DOB: 06-26-1935, 79 y.o.   MRN: [id number]  HPI Here for 2 weeks of a tender lesion on the penis. No recent sexual contacts. No fever, no groin adenopathy. He feels fairly well in general though he is recovering from brain surgery which was just 3 weeks ago. He has had basal cell cancers removed in the past.   Review of Systems  Constitutional: Negative.   Genitourinary: Positive for genital sores. Negative for decreased urine volume, discharge, penile swelling, scrotal swelling, penile pain and testicular pain.       Objective:   Physical Exam  Constitutional: He appears well-developed and well-nourished. No distress.  Genitourinary:  No inguinal adenopathy   Skin:  The dorsal penis has a 0.8 cm lesion which has a raised pearly border and a central umbilicated area. 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 Less likely syphillitic. Check a CBC and RPR today. Apply Mupiricin ointment. Refer to [facility].

## 2014-12-30 ENCOUNTER — Telehealth: Payer: Self-pay | Admitting: Family Medicine

## 2014-12-30 ENCOUNTER — Telehealth: Payer: Self-pay

## 2014-12-30 LAB — RPR

## 2014-12-30 NOTE — Telephone Encounter (Signed)
Ebony Hail from [facility] call to say pt  will be released from [facility] and is requesting that pt be set up asap for  Speech therapy occupation therapy at [facility]

## 2014-12-30 NOTE — Telephone Encounter (Signed)
Left message on machine for Nicholas Schmitt that Dr Sherren Mocha can not order speech therapy at this time.

## 2014-12-30 NOTE — Telephone Encounter (Signed)
Alonna Minium with home health speech rehab wanting to see if we can set pt up for outpatient speech therapy and occupational therapy at [facility]. He is being dc'd from home health b/c he is not longer home bound. Home health last visit is for PT this Thursday. [facility] phone # [phone]

## 2015-01-01 ENCOUNTER — Encounter: Payer: Self-pay | Admitting: Vascular Surgery

## 2015-01-05 ENCOUNTER — Telehealth: Payer: Self-pay | Admitting: Medical Oncology

## 2015-01-05 ENCOUNTER — Other Ambulatory Visit: Payer: Self-pay | Admitting: Medical Oncology

## 2015-01-05 DIAGNOSIS — C7931 Secondary malignant neoplasm of brain: Secondary | ICD-10-CM

## 2015-01-05 NOTE — Telephone Encounter (Signed)
Called to speech therapist

## 2015-01-05 NOTE — Telephone Encounter (Signed)
Concerned that remeron may be causing swelling in neck . It is better today. Pt denies sob, chest pain. I instructed June to tell pt to keep his appt.

## 2015-01-06 ENCOUNTER — Ambulatory Visit (HOSPITAL_COMMUNITY)
Admission: RE | Admit: 2015-01-06 | Discharge: 2015-01-06 | Disposition: A | Discharge status: Home / Self Care | Payer: Medicare Other | Source: Ambulatory Visit | Attending: Vascular Surgery | Admitting: Vascular Surgery

## 2015-01-06 ENCOUNTER — Telehealth: Payer: Self-pay | Admitting: Internal Medicine

## 2015-01-06 ENCOUNTER — Ambulatory Visit (HOSPITAL_BASED_OUTPATIENT_CLINIC_OR_DEPARTMENT_OTHER): Payer: Medicare Other | Admitting: Internal Medicine

## 2015-01-06 ENCOUNTER — Ambulatory Visit (INDEPENDENT_AMBULATORY_CARE_PROVIDER_SITE_OTHER): Payer: Medicare Other | Admitting: Vascular Surgery

## 2015-01-06 ENCOUNTER — Ambulatory Visit (HOSPITAL_BASED_OUTPATIENT_CLINIC_OR_DEPARTMENT_OTHER): Payer: Medicare Other

## 2015-01-06 ENCOUNTER — Other Ambulatory Visit (HOSPITAL_BASED_OUTPATIENT_CLINIC_OR_DEPARTMENT_OTHER): Payer: Medicare Other

## 2015-01-06 ENCOUNTER — Ambulatory Visit: Payer: Medicare Other | Admitting: Nutrition

## 2015-01-06 ENCOUNTER — Encounter: Payer: Self-pay | Admitting: Internal Medicine

## 2015-01-06 ENCOUNTER — Encounter: Payer: Self-pay | Admitting: Vascular Surgery

## 2015-01-06 VITALS — BP 115/78 | HR 96 | Temp 98.1°F | Resp 18 | Ht 72.0 in | Wt 180.0 lb

## 2015-01-06 VITALS — BP 125/78 | HR 112 | Temp 98.4°F | Resp 19 | Ht 72.0 in | Wt 178.8 lb

## 2015-01-06 DIAGNOSIS — C7931 Secondary malignant neoplasm of brain: Secondary | ICD-10-CM

## 2015-01-06 DIAGNOSIS — C3412 Malignant neoplasm of upper lobe, left bronchus or lung: Secondary | ICD-10-CM

## 2015-01-06 DIAGNOSIS — I739 Peripheral vascular disease, unspecified: Secondary | ICD-10-CM

## 2015-01-06 DIAGNOSIS — C3491 Malignant neoplasm of unspecified part of right bronchus or lung: Secondary | ICD-10-CM

## 2015-01-06 DIAGNOSIS — M79605 Pain in left leg: Secondary | ICD-10-CM

## 2015-01-06 DIAGNOSIS — Z5112 Encounter for antineoplastic immunotherapy: Secondary | ICD-10-CM | POA: Diagnosis not present

## 2015-01-06 DIAGNOSIS — D61818 Other pancytopenia: Secondary | ICD-10-CM

## 2015-01-06 DIAGNOSIS — Z79899 Other long term (current) drug therapy: Secondary | ICD-10-CM

## 2015-01-06 DIAGNOSIS — R221 Localized swelling, mass and lump, neck: Secondary | ICD-10-CM | POA: Diagnosis not present

## 2015-01-06 DIAGNOSIS — R208 Other disturbances of skin sensation: Secondary | ICD-10-CM | POA: Diagnosis not present

## 2015-01-06 DIAGNOSIS — R209 Unspecified disturbances of skin sensation: Secondary | ICD-10-CM | POA: Insufficient documentation

## 2015-01-06 DIAGNOSIS — C3490 Malignant neoplasm of unspecified part of unspecified bronchus or lung: Secondary | ICD-10-CM

## 2015-01-06 DIAGNOSIS — D630 Anemia in neoplastic disease: Secondary | ICD-10-CM

## 2015-01-06 LAB — CBC WITH DIFFERENTIAL/PLATELET
BASO%: 0.4 % (ref 0.0–2.0)
BASOS ABS: 0 10*3/uL (ref 0.0–0.1)
EOS%: 0.8 % (ref 0.0–7.0)
Eosinophils Absolute: 0 10*3/uL (ref 0.0–0.5)
HCT: 28.3 % — ABNORMAL LOW (ref 38.4–49.9)
HGB: 9.3 g/dL — ABNORMAL LOW (ref 13.0–17.1)
LYMPH%: 33.1 % (ref 14.0–49.0)
MCH: 31.5 pg (ref 27.2–33.4)
MCHC: 32.9 g/dL (ref 32.0–36.0)
MCV: 95.9 fL (ref 79.3–98.0)
MONO#: 0.5 10*3/uL (ref 0.1–0.9)
MONO%: 18.3 % — ABNORMAL HIGH (ref 0.0–14.0)
NEUT#: 1.2 10*3/uL — ABNORMAL LOW (ref 1.5–6.5)
NEUT%: 47.4 % (ref 39.0–75.0)
PLATELETS: 99 10*3/uL — AB (ref 140–400)
RBC: 2.95 10*6/uL — ABNORMAL LOW (ref 4.20–5.82)
RDW: 17.6 % — AB (ref 11.0–14.6)
WBC: 2.5 10*3/uL — AB (ref 4.0–10.3)
lymph#: 0.8 10*3/uL — ABNORMAL LOW (ref 0.9–3.3)

## 2015-01-06 LAB — COMPREHENSIVE METABOLIC PANEL (CC13)
ALBUMIN: 2.9 g/dL — AB (ref 3.5–5.0)
ALK PHOS: 100 U/L (ref 40–150)
ALT: 15 U/L (ref 0–55)
ANION GAP: 7 meq/L (ref 3–11)
AST: 16 U/L (ref 5–34)
BUN: 12.9 mg/dL (ref 7.0–26.0)
CALCIUM: 9.1 mg/dL (ref 8.4–10.4)
CHLORIDE: 110 meq/L — AB (ref 98–109)
CO2: 25 meq/L (ref 22–29)
Creatinine: 0.7 mg/dL (ref 0.7–1.3)
Glucose: 99 mg/dl (ref 70–140)
Potassium: 4.1 mEq/L (ref 3.5–5.1)
Sodium: 143 mEq/L (ref 136–145)
Total Bilirubin: 1.21 mg/dL — ABNORMAL HIGH (ref 0.20–1.20)
Total Protein: 5.8 g/dL — ABNORMAL LOW (ref 6.4–8.3)

## 2015-01-06 LAB — TSH CHCC: TSH: 2.394 m(IU)/L (ref 0.320–4.118)

## 2015-01-06 LAB — TECHNOLOGIST REVIEW: Technologist Review: 1

## 2015-01-06 MED ORDER — PROCHLORPERAZINE MALEATE 10 MG PO TABS
ORAL_TABLET | ORAL | Status: AC
  Filled 2015-01-06: qty 1

## 2015-01-06 MED ORDER — SODIUM CHLORIDE 0.9 % IV SOLN
Freq: Once | INTRAVENOUS | Status: AC
  Administered 2015-01-06: 10:00:00 via INTRAVENOUS

## 2015-01-06 MED ORDER — PROCHLORPERAZINE MALEATE 10 MG PO TABS
10.0000 mg | ORAL_TABLET | Freq: Once | ORAL | Status: AC
  Administered 2015-01-06: 10 mg via ORAL

## 2015-01-06 MED ORDER — SODIUM CHLORIDE 0.9 % IV SOLN
2.0000 mg/kg | Freq: Once | INTRAVENOUS | Status: AC
  Administered 2015-01-06: 175 mg via INTRAVENOUS
  Filled 2015-01-06: qty 7

## 2015-01-06 MED ORDER — FUROSEMIDE 20 MG PO TABS: 20.0000 mg | ORAL_TABLET | Freq: Every day | ORAL | Status: DC

## 2015-01-06 NOTE — Progress Notes (Signed)
OK to treat per Dr. Barnet Pall RN with low lab values.

## 2015-01-06 NOTE — Progress Notes (Signed)
Subjective:     Patient ID: Nicholas Schmitt, male   DOB: 1935/10/19, 79 y.o.   MRN: [id number]  HPI this 89 her old male was referred by Dr. Vertell Limber  For evaluation of possible circulation issues in the left leg. Patient had a craniotomy performed last month for a malignant tumor. He has had some aphasia since the surgery. He has noticed some discomfort and discoloration in the left fourth and fifth toes. He has no claudication symptoms. He has no history of stroke but does have a history of coronary artery disease apparently in the 81s. He has had arrhythmias in the past and currently has a pacemaker in place. He has been on anticoagulation-Coumadin in the past but not currently. He takes occasional aspirin.   Past Medical History  Diagnosis Date  . CAD (coronary artery disease)     positive stress test in 2006 led to left heart cath (8/06) showing 95% prox RCA, 90% CFX, and 90% mLAD.  patient had a cypher DES to all 3 lesions  . Hyperlipidemia   . Hearing loss   . AAA (abdominal aortic aneurysm) 11/09    3.2 cm   . Chronic renal insufficiency   . History of radiation therapy 03/08/04- 04/08/04    prostate 4600 cGy in 3 fractions, radioactive seed implant 05/04/04  . Myocardial infarction 1995  . Pacemaker 2014  . Dysrhythmia   . Shortness of breath     exertion  . Asthma     bronchial  . COPD (chronic obstructive pulmonary disease)   . Radiation Jan.11-Jan 29/2016    Right central chest 35 gray in 14 fractions  . Radiation Jan.2016    35 gray in 14 fx lower lumbar/upper sacrum  . Diverticulosis   . Hemorrhoids   . Pneumonia     hx  . GERD (gastroesophageal reflux disease)   . Hypertension     no med now for 3-4 months  . Cancer 2015    prostate   . Prostate cancer 2005    gleason 7  . Skin cancer     ear  . Lung cancer   . Cancer December 04, 2014    Cranial     History  Substance Use Topics  . Smoking status: Former Smoker -- 3.00 packs/day for 35 years    Types:  Cigarettes    Quit date: 07/30/1993  . Smokeless tobacco: Never Used  . Alcohol Use: No     Comment: hx alcohol abuse per note dated 04/11/11    Family History  Problem Relation Age of Onset  . Asthma Sister   . Asthma Brother   . Coronary artery disease Other     Allergies  Allergen Reactions  . Oxycodone Hcl Nausea Only     Current outpatient prescriptions:  .  acetaminophen (TYLENOL) 500 MG tablet, Take 1,000 mg by mouth every 6 (six) hours as needed (pain). , Disp: , Rfl:  .  albuterol (PROVENTIL,VENTOLIN) 90 MCG/ACT inhaler, Inhale 2 puffs into the lungs every 6 (six) hours as needed for wheezing or shortness of breath., Disp: , Rfl:  .  ALPRAZolam (XANAX) 0.25 MG tablet, Take 1 tablet (0.25 mg total) by mouth at bedtime as needed for anxiety., Disp: 30 tablet, Rfl: 0 .  budesonide-formoterol (SYMBICORT) 160-4.5 MCG/ACT inhaler, Inhale 2 puffs into the lungs 2 (two) times daily. (Patient taking differently: Inhale 1 puff into the lungs 2 (two) times daily. ), Disp: , Rfl:  .  famotidine (PEPCID) 20  MG tablet, Take 20 mg by mouth at bedtime., Disp: , Rfl:  .  furosemide (LASIX) 20 MG tablet, Take 1 tablet (20 mg total) by mouth daily. As needed for swelling., Disp: 30 tablet, Rfl: 0 .  hydrocortisone (ANUSOL-HC) 2.5 % rectal cream, Place 1 application rectally 2 (two) times daily., Disp: 30 g, Rfl: 3 .  lactulose (CHRONULAC) 10 GM/15ML solution, Take 15 mLs (10 g total) by mouth 2 (two) times daily as needed for mild constipation., Disp: 473 mL, Rfl: 1 .  mirtazapine (REMERON) 30 MG tablet, Take 1 tablet (30 mg total) by mouth at bedtime., Disp: 30 tablet, Rfl: 2 .  Multiple Minerals (CALCIUM-MAGNESIUM-ZINC) TABS, Take 1 tablet by mouth daily., Disp: , Rfl:  .  Multiple Vitamin (MULTIVITAMIN WITH MINERALS) TABS tablet, Take 1 tablet by mouth daily., Disp: , Rfl:  .  mupirocin ointment (BACTROBAN) 2 %, Apply 1 application topically 2 (two) times daily., Disp: 22 g, Rfl: 0 .   nitroGLYCERIN (NITROSTAT) 0.4 MG SL tablet, Place 0.4 mg under the tongue every 5 (five) minutes as needed for chest pain (MAX 3 TABLETS)., Disp: , Rfl:  .  omeprazole (PRILOSEC) 40 MG capsule, Take 40 mg by mouth daily before supper. , Disp: , Rfl: 0 .  prochlorperazine (COMPAZINE) 10 MG tablet, Take 10 mg by mouth every 6 (six) hours as needed for nausea or vomiting., Disp: , Rfl:  .  Sennosides (EX-LAX) 15 MG CHEW, Chew 15 mg by mouth at bedtime., Disp: , Rfl:  .  tamsulosin (FLOMAX) 0.4 MG CAPS capsule, Take 0.4 mg by mouth at bedtime. , Disp: , Rfl: 2 .  dexamethasone (DECADRON) 2 MG tablet, Take 1 tablet (2 mg total) by mouth 2 (two) times daily. (Patient not taking: Reported on 01/06/2015), Disp: 30 tablet, Rfl: 0 No current facility-administered medications for this visit.  Facility-Administered Medications Ordered in Other Visits:  .  heparin lock flush 100 unit/mL, 500 Units, Intracatheter, Daily PRN, Adrena E Johnson, PA-C .  sodium chloride 0.9 % injection 10 mL, 10 mL, Intracatheter, PRN, Adrena E Johnson, PA-C .  sodium chloride 0.9 % injection 3 mL, 3 mL, Intracatheter, PRN, Carlton Adam, PA-C  Filed Vitals:   01/06/15 1315  BP: 115/78  Pulse: 96  Temp: 98.1 F (36.7 C)  TempSrc: Oral  Resp: 18  Height: 6' (1.829 m)  Weight: 180 lb (81.647 kg)  SpO2: 96%    Body mass index is 24.41 kg/(m^2).           Review of Systems Denies chest pain but does have dyspnea on exertion. History of bilateral lower extremity edema, aphasia, and generalized weakness. His history of prostate cancer, lung cancer, has had radiation to chest following previous diagnosis of lung cancer. See history of present illness regarding craniotomy for malignant tumor in the brain.     Objective:   Physical Exam BP 115/78 mmHg  Pulse 96  Temp(Src) 98.1 F (36.7 C) (Oral)  Resp 18  Ht 6' (1.829 m)  Wt 180 lb (81.647 kg)  BMI 24.41 kg/m2  SpO2 96%  Gen.-alert and oriented x3 in no  apparent distress HEENT normal for age Lungs no rhonchi or wheezing Cardiovascular regular rhythm no murmurs carotid pulses 3+ palpable no bruits audible Abdomen soft nontender no palpable masses Musculoskeletal free of  major deformities Skin clear -no rashes Neurologic  Speech slow and relatively clear with mild  Expressive aphasia Lower extremities 3+ femoral and dorsalis pedis pulses palpable bilaterally with no  edema Very slight area of discoloration lateral aspect of left fourth and fifth toes with no ulceration cellulitis or blistering. Not tender to touch.  Today I ordered lower extremity ABIs which were normal with triphasic waveforms bilaterally with ABIs of 1.04 on the right and 1.13 on the left       Assessment:      slight discoloration left fourth and fifth toes and cold sensation left leg with findings of normal distal arterial circulation with normal ABIs  No evidence of ischemia on physical exam  Do not know etiology of toe discoloration     Plan:      recommend daily aspirin 81 mg No need for further arterial workup

## 2015-01-06 NOTE — Progress Notes (Signed)
[facility] [phone]   Fax:([phone]  OFFICE PROGRESS NOTE  [name], MD [place] [address]  DIAGNOSIS: Non-small cell carcinoma of lung, stage 4  Primary site: Lung (Right)  Staging method: AJCC 7th Edition  Clinical: Stage IV (T1a, N3, M1b) signed by Curt Bears, MD on 02/01/2014 1:42 PM  Summary: Stage IV (T1a, N3, M1b) Prostate cancer  Primary site: Prostate  Clinical: Stage I (T1c, NX, M0) signed by Wyatt Portela, MD on 08/06/2013 1:59 PM  Summary: Stage I (T1c, NX, M0)  PRIOR THERAPY:  1) Systemic chemotherapy with carboplatin for an AUC of 5 and Alimta 500 mg per meter squared given every 3 weeks. Status post 6 cycles, last dose was given 05/20/2014 discontinued secondary to disease progression. 2) palliative radiotherapy to the right lung and lumbar spines under the care of Dr. Sondra Come. 3) status post craniotomy with tumor excision under the care of Dr. Vertell Limber on 12/04/2014.  CURRENT THERAPY: Immunotherapy with Ketruda (pembrolizumab) 2 MG/KG every 3 weeks. First dose 07/29/2014. He has positive PDL 1 expression (90%). Status post 6 cycles and his treatment is currently on hold secondary to the recent metastatic brain lesion and surgery. He will resume cycle #7 today.  INTERVAL HISTORY: Nicholas Schmitt 79 y.o. male returns to the clinic today for follow-up visit accompanied by his wife. He is feeling fine today with no specific complaints except for mild fatigue. He also has swelling of the neck and right upper extremity questionable for elderly SVC. He denied having any significant fever or chills, no nausea or vomiting. The patient denied having any significant chest pain, shortness of breath except with exertion, cough or hemoptysis. He is here for evaluation and to resume his immunotherapy.  MEDICAL HISTORY: Past Medical History  Diagnosis Date  . CAD (coronary artery disease)    positive stress test in 2006 led to left heart cath (8/06) showing 95% prox RCA, 90% CFX, and 90% mLAD.  patient had a cypher DES to all 3 lesions  . Hyperlipidemia   . Hearing loss   . AAA (abdominal aortic aneurysm) 11/09    3.2 cm   . Chronic renal insufficiency   . History of radiation therapy 03/08/04- 04/08/04    prostate 4600 cGy in 3 fractions, radioactive seed implant 05/04/04  . Myocardial infarction 1995  . Pacemaker 2014  . Dysrhythmia   . Shortness of breath     exertion  . Asthma     bronchial  . COPD (chronic obstructive pulmonary disease)   . Radiation Jan.11-Jan 29/2016    Right central chest 35 gray in 14 fractions  . Radiation Jan.2016    35 gray in 14 fx lower lumbar/upper sacrum  . Diverticulosis   . Hemorrhoids   . Cancer 2015    prostate   . Prostate cancer 2005    gleason 7  . Skin cancer     ear  . Lung cancer   . Pneumonia     hx  . GERD (gastroesophageal reflux disease)   . Hypertension     no med now for 3-4 months    ALLERGIES:  is allergic to oxycodone hcl.  MEDICATIONS:  Current Outpatient Prescriptions  Medication Sig Dispense Refill  . acetaminophen (TYLENOL) 500 MG tablet Take 1,000 mg by mouth every 6 (six) hours as needed (pain).     [name] albuterol (PROVENTIL,VENTOLIN) 90 MCG/ACT inhaler Inhale 2 puffs into the lungs  every 6 (six) hours as needed for wheezing or shortness of breath.    . budesonide-formoterol (SYMBICORT) 160-4.5 MCG/ACT inhaler Inhale 2 puffs into the lungs 2 (two) times daily. (Patient taking differently: Inhale 1 puff into the lungs 2 (two) times daily. )    . famotidine (PEPCID) 20 MG tablet Take 20 mg by mouth at bedtime.    . hydrocortisone (ANUSOL-HC) 2.5 % rectal cream Place 1 application rectally 2 (two) times daily. 30 g 3  . lactulose (CHRONULAC) 10 GM/15ML solution Take 15 mLs (10 g total) by mouth 2 (two) times daily as needed for mild constipation. 473 mL 1  . mirtazapine (REMERON) 30 MG tablet Take 1 tablet  (30 mg total) by mouth at bedtime. 30 tablet 2  . Multiple Minerals (CALCIUM-MAGNESIUM-ZINC) TABS Take 1 tablet by mouth daily.    . Multiple Vitamin (MULTIVITAMIN WITH MINERALS) TABS tablet Take 1 tablet by mouth daily.    . mupirocin ointment (BACTROBAN) 2 % Apply 1 application topically 2 (two) times daily. 22 g 0  . omeprazole (PRILOSEC) 40 MG capsule Take 40 mg by mouth daily before supper.   0  . Sennosides (EX-LAX) 15 MG CHEW Chew 15 mg by mouth at bedtime.    . tamsulosin (FLOMAX) 0.4 MG CAPS capsule Take 0.4 mg by mouth at bedtime.   2  . ALPRAZolam (XANAX) 0.25 MG tablet Take 1 tablet (0.25 mg total) by mouth at bedtime as needed for anxiety. (Patient not taking: Reported on 01/06/2015) 30 tablet 0  . dexamethasone (DECADRON) 2 MG tablet Take 1 tablet (2 mg total) by mouth 2 (two) times daily. (Patient not taking: Reported on 01/06/2015) 30 tablet 0  . furosemide (LASIX) 20 MG tablet Take 1 tablet (20 mg total) by mouth daily. As needed for swelling. 30 tablet 0  . nitroGLYCERIN (NITROSTAT) 0.4 MG SL tablet Place 0.4 mg under the tongue every 5 (five) minutes as needed for chest pain (MAX 3 TABLETS).    [name] prochlorperazine (COMPAZINE) 10 MG tablet Take 10 mg by mouth every 6 (six) hours as needed for nausea or vomiting.     No current facility-administered medications for this visit.   Facility-Administered Medications Ordered in Other Visits  Medication Dose Route Frequency Provider Last Rate Last Dose  . heparin lock flush 100 unit/mL  500 Units Intracatheter Daily PRN Adrena E Johnson, PA-C      . sodium chloride 0.9 % injection 10 mL  10 mL Intracatheter PRN Adrena E Johnson, PA-C      . sodium chloride 0.9 % injection 3 mL  3 mL Intracatheter PRN Carlton Adam, PA-C        SURGICAL HISTORY:  Past Surgical History  Procedure Laterality Date  . Ptca  2006  . Coronary stent placement    . Permanent pacemaker insertion Bilateral 02/26/13    MDT Adapta L implanted by Dr Rayann Heman  for sick sinus syndrome  . Radioactive seed implant  05/04/2004    8000 cGy, Dr Danny Lawless Dr Reece Agar  . Elbow surgery Bilateral 1973  . Coronary angioplasty    . Mediastinoscopy N/A 01/28/2014    Procedure: MEDIASTINOSCOPY;  Surgeon: Melrose Nakayama, MD;  Location: [facility];  Service: Thoracic;  Laterality: N/A;  . Permanent pacemaker insertion N/A 02/26/2013    Procedure: PERMANENT PACEMAKER INSERTION;  Surgeon: [name], MD;  Location: [facility];  Service: Cardiovascular;  Laterality: N/A;  . Insert / replace / remove pacemaker  not removed  . Craniotomy N/A 12/04/2014    Procedure: CRANIOTOMY TUMOR EXCISION, LEFT;  Surgeon: [name], MD;  Location: [facility];  Service: Neurosurgery;  Laterality: N/A;  CRANIOTOMY TUMOR EXCISION  . Application of cranial navigation  12/04/2014    Procedure: APPLICATION OF CRANIAL NAVIGATION;  Surgeon: [name], MD;  Location: [facility];  Service: Neurosurgery;;    REVIEW OF SYSTEMS:  Constitutional: positive for fatigue Eyes: negative Ears, nose, mouth, throat, and face: negative Respiratory: positive for dyspnea on exertion Cardiovascular: negative Gastrointestinal: negative Genitourinary:negative Integument/breast: negative Hematologic/lymphatic: negative Musculoskeletal:negative Neurological: negative Behavioral/Psych: negative Endocrine: negative Allergic/Immunologic: negative   PHYSICAL EXAMINATION: General appearance: alert, cooperative, fatigued and no distress Head: Normocephalic, without obvious abnormality, atraumatic Neck: no adenopathy, no carotid bruit, supple, symmetrical, trachea midline and thyroid not enlarged, symmetric, no tenderness/mass/nodules Lymph nodes: Cervical, supraclavicular, and axillary nodes normal. Resp: clear to auscultation bilaterally Back: symmetric, no curvature. ROM normal. No CVA tenderness. Cardio: regular rate and rhythm, S1, S2 normal, no murmur, click, rub or gallop GI:  soft, non-tender; bowel sounds normal; no masses,  no organomegaly Extremities: extremities normal, atraumatic, no cyanosis or edema Neurologic: Alert and oriented X 3, normal strength and tone. Normal symmetric reflexes. Normal coordination and gait  ECOG PERFORMANCE STATUS: 1 - Symptomatic but completely ambulatory  Blood pressure 125/78, pulse 112, temperature 98.4 F (36.9 C), temperature source Oral, resp. rate 19, height 6' (1.829 m), weight 178 lb 12.8 oz (81.103 kg), SpO2 97 %.  LABORATORY DATA: Lab Results  Component Value Date   WBC 2.5* 01/06/2015   HGB 9.3* 01/06/2015   HCT 28.3* 01/06/2015   MCV 95.9 01/06/2015   PLT 99* 01/06/2015      Chemistry      Component Value Date/Time   NA 138 12/23/2014 0854   NA 135 11/28/2014 1353   K 4.0 12/23/2014 0854   K 4.8 11/28/2014 1353   CL 101 11/28/2014 1353   CO2 24 12/23/2014 0854   CO2 25 11/28/2014 1353   BUN 17.6 12/23/2014 0854   BUN 26* 11/28/2014 1353   CREATININE 0.8 12/23/2014 0854   CREATININE 0.73 11/28/2014 1353      Component Value Date/Time   CALCIUM 9.1 12/23/2014 0854   CALCIUM 9.6 11/28/2014 1353   ALKPHOS 60 12/23/2014 0854   ALKPHOS 66 07/21/2014 1821   AST 27 12/23/2014 0854   AST 43* 07/21/2014 1821   ALT 44 12/23/2014 0854   ALT 37 07/21/2014 1821   BILITOT 1.35* 12/23/2014 0854   BILITOT 1.2 07/21/2014 1821       RADIOGRAPHIC STUDIES: No results found.  ASSESSMENT AND PLAN: this is a very pleasant 79 years old white male with stage IV non-small cell lung cancer, adenocarcinoma with positive PDL 1 expression, completed systemic chemotherapy with carboplatin and Alimta status post 6 cycles and tolerated his treatment fairly well.  He is currently on treatment with immunotherapy with Ketruda (pembrolizumab) status post 6 cycles with stable disease in the chest, abdomen and pelvis. Unfortunately the patient was found to have metastatic brain lesions and he underwent craniotomy with resection  of the brain tumor.  The patient is feeling much better today and he is here to resume his immunotherapy with [place] (pembrolizumab). He will start cycle #7 today. For the patient, he was started on Remeron 30 mg by mouth daily at bedtime and feeling much better. For the swelling of the right upper extremity and neck area, I started the patient on Lasix  20 mg by mouth daily as needed for swelling. We will continue to monitor the swelling closely and consider him for repeat CT scan of the chest for further evaluation and to rule out SVC syndrome if it is getting worse. He was advised to call if he has any concerning symptoms in the interval. The patient was advised to call immediately if he has any concerning symptoms in the interval. The patient voices understanding of current disease status and treatment options and is in agreement with the current care plan.  All questions were answered. The patient knows to call the clinic with any problems, questions or concerns. We can certainly see the patient much sooner if necessary.  Disclaimer: This note was dictated with voice recognition software. Similar sounding words can inadvertently be transcribed and may not be corrected upon review.

## 2015-01-06 NOTE — Progress Notes (Signed)
Nutrition follow-up completed with patient during infusion for stage IV lung cancer. Weight slowly increasing and documented as 178 pounds July 26. Patient reports appetite has improved and he is eating more food. Patient no longer drinks oral nutrition supplements because oral intake has improved. Denies nutrition impact symptoms.  Nutrition diagnosis: Food and nutrition related knowledge deficit improved.  Intervention:  Patient educated to continue adequate calories and protein to promote healthy body weight. Encouraged patient to contact me with questions or concerns. Teach back method was used.  Monitoring, evaluation, goals: Patient will tolerate adequate calories and protein to minimize weight loss.  Next visit: Tuesday, August 16, during infusion.  **Disclaimer: This note was dictated with voice recognition software. Similar sounding words can inadvertently be transcribed and this note may contain transcription errors which may not have been corrected upon publication of note.**

## 2015-01-06 NOTE — Telephone Encounter (Signed)
Gave adn printd appt sched and avs fo rpt for July thru Sept

## 2015-01-06 NOTE — Patient Instructions (Signed)
[facility] Discharge Instructions for Patients Receiving Chemotherapy  Today you received the following chemotherapy agents Beryle Flock  To help prevent nausea and vomiting after your treatment, we encourage you to take your nausea medication if needed.   If you develop nausea and vomiting that is not controlled by your nausea medication, call the clinic.   BELOW ARE SYMPTOMS THAT SHOULD BE REPORTED IMMEDIATELY:  *FEVER GREATER THAN 100.5 F  *CHILLS WITH OR WITHOUT FEVER  NAUSEA AND VOMITING THAT IS NOT CONTROLLED WITH YOUR NAUSEA MEDICATION  *UNUSUAL SHORTNESS OF BREATH  *UNUSUAL BRUISING OR BLEEDING  TENDERNESS IN MOUTH AND THROAT WITH OR WITHOUT PRESENCE OF ULCERS  *URINARY PROBLEMS  *BOWEL PROBLEMS  UNUSUAL RASH Items with * indicate a potential emergency and should be followed up as soon as possible.  Feel free to call the clinic you have any questions or concerns. The clinic phone number is [phone].  Please show the [facility] at check-in to the Emergency Department and triage nurse.

## 2015-01-15 ENCOUNTER — Telehealth: Payer: Self-pay | Admitting: Medical Oncology

## 2015-01-15 ENCOUNTER — Telehealth: Payer: Self-pay | Admitting: Nurse Practitioner

## 2015-01-15 ENCOUNTER — Ambulatory Visit (HOSPITAL_BASED_OUTPATIENT_CLINIC_OR_DEPARTMENT_OTHER)
Admission: RE | Admit: 2015-01-15 | Discharge: 2015-01-15 | Disposition: A | Discharge status: Home / Self Care | Payer: Medicare Other | Source: Ambulatory Visit | Attending: Nurse Practitioner | Admitting: Nurse Practitioner

## 2015-01-15 ENCOUNTER — Ambulatory Visit (HOSPITAL_BASED_OUTPATIENT_CLINIC_OR_DEPARTMENT_OTHER): Payer: Medicare Other | Admitting: Nurse Practitioner

## 2015-01-15 ENCOUNTER — Other Ambulatory Visit (HOSPITAL_BASED_OUTPATIENT_CLINIC_OR_DEPARTMENT_OTHER): Payer: Medicare Other

## 2015-01-15 ENCOUNTER — Ambulatory Visit (HOSPITAL_COMMUNITY)
Admission: RE | Admit: 2015-01-15 | Discharge: 2015-01-15 | Disposition: A | Discharge status: Home / Self Care | Payer: Medicare Other | Source: Ambulatory Visit | Attending: Nurse Practitioner | Admitting: Nurse Practitioner

## 2015-01-15 ENCOUNTER — Other Ambulatory Visit: Payer: Self-pay | Admitting: Medical Oncology

## 2015-01-15 VITALS — BP 125/66 | HR 89 | Temp 98.4°F | Resp 24 | Wt 180.3 lb

## 2015-01-15 DIAGNOSIS — R609 Edema, unspecified: Secondary | ICD-10-CM

## 2015-01-15 DIAGNOSIS — C3490 Malignant neoplasm of unspecified part of unspecified bronchus or lung: Secondary | ICD-10-CM

## 2015-01-15 DIAGNOSIS — Z86718 Personal history of other venous thrombosis and embolism: Secondary | ICD-10-CM

## 2015-01-15 DIAGNOSIS — C7951 Secondary malignant neoplasm of bone: Secondary | ICD-10-CM

## 2015-01-15 DIAGNOSIS — R599 Enlarged lymph nodes, unspecified: Secondary | ICD-10-CM | POA: Insufficient documentation

## 2015-01-15 DIAGNOSIS — R0600 Dyspnea, unspecified: Secondary | ICD-10-CM

## 2015-01-15 DIAGNOSIS — C7931 Secondary malignant neoplasm of brain: Secondary | ICD-10-CM | POA: Diagnosis not present

## 2015-01-15 DIAGNOSIS — I82409 Acute embolism and thrombosis of unspecified deep veins of unspecified lower extremity: Secondary | ICD-10-CM

## 2015-01-15 DIAGNOSIS — D630 Anemia in neoplastic disease: Secondary | ICD-10-CM | POA: Diagnosis not present

## 2015-01-15 DIAGNOSIS — Z79899 Other long term (current) drug therapy: Secondary | ICD-10-CM

## 2015-01-15 LAB — COMPREHENSIVE METABOLIC PANEL (CC13)
ALK PHOS: 99 U/L (ref 40–150)
ALT: 12 U/L (ref 0–55)
ANION GAP: 4 meq/L (ref 3–11)
AST: 13 U/L (ref 5–34)
Albumin: 2.9 g/dL — ABNORMAL LOW (ref 3.5–5.0)
BUN: 13.1 mg/dL (ref 7.0–26.0)
CALCIUM: 9 mg/dL (ref 8.4–10.4)
CO2: 30 mEq/L — ABNORMAL HIGH (ref 22–29)
CREATININE: 0.7 mg/dL (ref 0.7–1.3)
Chloride: 109 mEq/L (ref 98–109)
EGFR: 90 mL/min/{1.73_m2} — ABNORMAL LOW (ref >90)
Glucose: 118 mg/dl (ref 70–140)
Potassium: 4.3 mEq/L (ref 3.5–5.1)
Sodium: 142 mEq/L (ref 136–145)
Total Bilirubin: 1.31 mg/dL — ABNORMAL HIGH (ref 0.20–1.20)
Total Protein: 5.8 g/dL — ABNORMAL LOW (ref 6.4–8.3)

## 2015-01-15 LAB — CBC WITH DIFFERENTIAL/PLATELET
BASO%: 0.6 % (ref 0.0–2.0)
Basophils Absolute: 0 10*3/uL (ref 0.0–0.1)
EOS ABS: 0.1 10*3/uL (ref 0.0–0.5)
EOS%: 2.4 % (ref 0.0–7.0)
HEMATOCRIT: 26 % — AB (ref 38.4–49.9)
HGB: 8.7 g/dL — ABNORMAL LOW (ref 13.0–17.1)
LYMPH%: 15.4 % (ref 14.0–49.0)
MCH: 31.7 pg (ref 27.2–33.4)
MCHC: 33.3 g/dL (ref 32.0–36.0)
MCV: 95.3 fL (ref 79.3–98.0)
MONO#: 0.6 10*3/uL (ref 0.1–0.9)
MONO%: 15.5 % — ABNORMAL HIGH (ref 0.0–14.0)
NEUT%: 66.1 % (ref 39.0–75.0)
NEUTROS ABS: 2.4 10*3/uL (ref 1.5–6.5)
Platelets: 138 10*3/uL — ABNORMAL LOW (ref 140–400)
RBC: 2.73 10*6/uL — ABNORMAL LOW (ref 4.20–5.82)
RDW: 17.6 % — ABNORMAL HIGH (ref 11.0–14.6)
WBC: 3.7 10*3/uL — ABNORMAL LOW (ref 4.0–10.3)
lymph#: 0.6 10*3/uL — ABNORMAL LOW (ref 0.9–3.3)

## 2015-01-15 LAB — TSH CHCC: TSH: 1.982 m[IU]/L (ref 0.320–4.118)

## 2015-01-15 MED ORDER — ENOXAPARIN SODIUM 120 MG/0.8ML ~~LOC~~ SOLN
120.0000 mg | Freq: Once | SUBCUTANEOUS | Status: AC
  Administered 2015-01-15: 120 mg via SUBCUTANEOUS
  Filled 2015-01-15: qty 0.8

## 2015-01-15 MED ORDER — ENOXAPARIN SODIUM 120 MG/0.8ML ~~LOC~~ SOLN: 120.0000 mg | SUBCUTANEOUS | Status: DC

## 2015-01-15 NOTE — Telephone Encounter (Signed)
Requests appt.for pt symptoms. Per Julien Nordmann I sent Onc tx request to see Cyndee.

## 2015-01-15 NOTE — Progress Notes (Signed)
Pt teaching for self administration on lovenox. Pt injected himself. Wife stated she could help him hold up his shirt, but she would have to close her eyes.

## 2015-01-15 NOTE — Progress Notes (Addendum)
Bilateral lower extremity venous duplex completed.  Right:  DVT noted in the popliteal, gastrocnemius, posterior tibial, and peroneal veins.  No evidence of superficial thrombosis.  No Baker's cyst.  Left:  No evidence of DVT, superficial thrombosis, or Baker's cyst.

## 2015-01-15 NOTE — Telephone Encounter (Signed)
Pt confirmed labs/ov per 08/04 POF, gave pt avs and calendar.... KJ, gave pt barium

## 2015-01-15 NOTE — Progress Notes (Signed)
Right upper extremity venous duplex completed:  No evidence of DVT.  There appears to be superficial thrombosis in the cephalic vein in the forearm.

## 2015-01-16 LAB — PSA: PSA: 2.91 ng/mL (ref <=4.00)

## 2015-01-17 ENCOUNTER — Encounter: Payer: Self-pay | Admitting: Nurse Practitioner

## 2015-01-17 DIAGNOSIS — I82409 Acute embolism and thrombosis of unspecified deep veins of unspecified lower extremity: Secondary | ICD-10-CM | POA: Insufficient documentation

## 2015-01-17 NOTE — Assessment & Plan Note (Signed)
Blood counts revealed hgb down from 9.3 to 8.7.  Pt continues to c/o SOB as baseline; but denies any worsening dyspnea.  He is complaining of some mild increased fatigue however.  Will continue to monitor patient closely.

## 2015-01-17 NOTE — Progress Notes (Signed)
SYMPTOM MANAGEMENT CLINIC   HPI: Nicholas Schmitt 79 y.o. male diagnosed with lung cancer; with both brain and bone metastasis.  Currently undergoing Keytruda immunotherapy.   Patient is complaining of some increased fatigue; but states that his chronic dyspnea is stable.  He typically has some chronic peripheral edema as well; but has noted a slight increased edema to his right leg within the past few days.  He denies any calf tenderness.  Also, patient has noted some edema to his right forearm as well.  He denies any chest pain, chest pressure, increasing dyspnea, or pain with inspiration.  He has home O2 that he uses on a PRN basis.   HPI  ROS  Past Medical History  Diagnosis Date  . CAD (coronary artery disease)     positive stress test in 2006 led to left heart cath (8/06) showing 95% prox RCA, 90% CFX, and 90% mLAD.  patient had a cypher DES to all 3 lesions  . Hyperlipidemia   . Hearing loss   . AAA (abdominal aortic aneurysm) 11/09    3.2 cm   . Chronic renal insufficiency   . History of radiation therapy 03/08/04- 04/08/04    prostate 4600 cGy in 3 fractions, radioactive seed implant 05/04/04  . Myocardial infarction 1995  . Pacemaker 2014  . Dysrhythmia   . Shortness of breath     exertion  . Asthma     bronchial  . COPD (chronic obstructive pulmonary disease)   . Radiation Jan.11-Jan 29/2016    Right central chest 35 gray in 14 fractions  . Radiation Jan.2016    35 gray in 14 fx lower lumbar/upper sacrum  . Diverticulosis   . Hemorrhoids   . Pneumonia     hx  . GERD (gastroesophageal reflux disease)   . Hypertension     no med now for 3-4 months  . Cancer 2015    prostate   . Prostate cancer 2005    gleason 7  . Skin cancer     ear  . Lung cancer   . Cancer December 04, 2014    Cranial     Past Surgical History  Procedure Laterality Date  . Ptca  2006  . Coronary stent placement    . Permanent pacemaker insertion Bilateral 02/26/13    MDT Adapta L  implanted by Dr Rayann Heman for sick sinus syndrome  . Radioactive seed implant  05/04/2004    8000 cGy, Dr Danny Lawless Dr Reece Agar  . Elbow surgery Bilateral 1973  . Coronary angioplasty    . Mediastinoscopy N/A 01/28/2014    Procedure: MEDIASTINOSCOPY;  Surgeon: Melrose Nakayama, MD;  Location: [facility];  Service: Thoracic;  Laterality: N/A;  . Permanent pacemaker insertion N/A 02/26/2013    Procedure: PERMANENT PACEMAKER INSERTION;  Surgeon: [name], MD;  Location: [facility];  Service: Cardiovascular;  Laterality: N/A;  . Insert / replace / remove pacemaker      not removed  . Craniotomy N/A 12/04/2014    Procedure: CRANIOTOMY TUMOR EXCISION, LEFT;  Surgeon: [name], MD;  Location: [facility];  Service: Neurosurgery;  Laterality: N/A;  CRANIOTOMY TUMOR EXCISION  . 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    is allergic to oxycodone hcl.    Medication List       This list is accurate as of: 01/15/15 11:59  PM.  Always use your most recent med list.               acetaminophen 500 MG tablet  Commonly known as:  TYLENOL  Take 1,000 mg by mouth every 6 (six) hours as needed (pain).     albuterol 90 MCG/ACT inhaler  Commonly known as:  PROVENTIL,VENTOLIN  Inhale 2 puffs into the lungs every 6 (six) hours as needed for wheezing or shortness of breath.     ALPRAZolam 0.25 MG tablet  Commonly known as:  XANAX  Take 1 tablet (0.25 mg total) by mouth at bedtime as needed for anxiety.     budesonide-formoterol 160-4.5 MCG/ACT inhaler  Commonly known as:  SYMBICORT  Inhale 2 puffs into the lungs 2 (two) times daily.     Calcium-Magnesium-Zinc Tabs  Take 1 tablet by mouth daily.     dexamethasone 2 MG tablet  Commonly known as:  DECADRON  Take 1 tablet (2 mg total) by mouth 2 (two) times daily.     enoxaparin 120 MG/0.8ML injection  Commonly known as:  LOVENOX  Inject 0.8 mLs (120 mg total) into the skin daily.     EX-LAX 15 MG Chew  Generic drug:  Sennosides  Chew 15 mg by mouth at bedtime.     famotidine 20 MG tablet  Commonly known as:  PEPCID  Take 20 mg by mouth at bedtime.     furosemide 20 MG tablet  Commonly known as:  LASIX  Take 1 tablet (20 mg total) by mouth daily. As needed for swelling.     hydrocortisone 2.5 % rectal cream  Commonly known as:  ANUSOL-HC  Place 1 application rectally 2 (two) times daily.     lactulose 10 GM/15ML solution  Commonly known as:  CHRONULAC  Take 15 mLs (10 g total) by mouth 2 (two) times daily as needed for mild constipation.     mirtazapine 30 MG tablet  Commonly known as:  REMERON  Take 1 tablet (30 mg total) by mouth at bedtime.     multivitamin with minerals Tabs tablet  Take 1 tablet by mouth daily.     mupirocin ointment 2 %  Commonly known as:  BACTROBAN  Apply 1 application topically 2 (two) times daily.     nitroGLYCERIN 0.4 MG SL tablet  Commonly known as:  NITROSTAT  Place 0.4 mg under the tongue every 5 (five)  minutes as needed for chest pain (MAX 3 TABLETS).     omeprazole 40 MG capsule  Commonly known as:  PRILOSEC  Take 40 mg by mouth daily before supper.     prochlorperazine 10 MG tablet  Commonly known as:  COMPAZINE  Take 10 mg by mouth every 6 (six) hours as needed for nausea or vomiting.     tamsulosin 0.4 MG Caps  capsule  Commonly known as:  FLOMAX  Take 0.4 mg by mouth at bedtime.         PHYSICAL EXAMINATION  Oncology Vitals 01/15/2015 01/06/2015 01/06/2015 12/29/2014 12/23/2014 12/06/2014 12/06/2014  Height - 183 cm 183 cm 183 cm 183 cm - -  Weight 81.784 kg 81.647 kg 81.103 kg 78.472 kg 78.654 kg - -  Weight (lbs) 180 lbs 5 oz 180 lbs 178 lbs 13 oz 173 lbs 173 lbs 6 oz - -  BMI (kg/m2) - 24.41 kg/m2 24.25 kg/m2 23.46 kg/m2 23.52 kg/m2 - -  Temp 98.4 98.1 98.4 98.1 98.3 97.8 -  Pulse 89 96 112 105 86 70 60  Resp _0 - _1 SpO2 - 96 97 - 98 97 96  BSA (m2) - 2.04 m2 2.03 m2 2 m2 2 m2 - -   BP Readings from Last 3 Encounters:  01/15/15 125/66  01/06/15 115/78  01/06/15 125/78    Physical Exam  Constitutional: He is oriented to person, place, and time.  Fatigued, weak, and chronically ill appearing.   HENT:  Head: Normocephalic and atraumatic.  Mouth/Throat: Oropharynx is clear and moist.  Eyes: Conjunctivae and EOM are normal. Pupils are equal, round, and reactive to light. Right eye exhibits no discharge. Left eye exhibits no discharge. No scleral icterus.  Neck: Normal range of motion. Neck supple. No JVD present. No tracheal deviation present. No thyromegaly present.  Cardiovascular: Normal rate, regular rhythm, normal heart sounds and intact distal pulses.   Pulmonary/Chest: No respiratory distress. He has no wheezes. He has no rales. He exhibits no tenderness.  Mild wheezing to bilat bases. No coughing. Minimal SOB on exam.   Abdominal: Soft. Bowel sounds are normal. He exhibits no distension and no mass. There is no tenderness. There is no rebound and no  guarding.  Musculoskeletal: Normal range of motion. He exhibits edema. He exhibits no tenderness.  On exam the patient has +1-2 peripheral edema to his bilateral lower extremities; with the right slightly increased when compared to the left.  No calf tenderness on exam.  Also, patient's right forearm with some edema as well.  There is no erythema, warmth, tenderness with palpation, or red streaks.  All pulses are palpable in all extremities are warm.     Lymphadenopathy:    He has no cervical adenopathy.  Neurological: He is alert and oriented to person, place, and time. Gait normal.  Skin: Skin is warm and dry. No rash noted. No erythema. There is pallor.  Psychiatric: Affect normal.  Nursing note and vitals reviewed.   LABORATORY DATA:. Appointment on 01/15/2015  Component Date Value Ref Range Status  . WBC 01/15/2015 3.7* 4.0 - 10.3 10e3/uL Final  . NEUT# 01/15/2015 2.4  1.5 - 6.5 10e3/uL Final  . HGB 01/15/2015 8.7* 13.0 - 17.1 g/dL Final  . HCT 01/15/2015 26.0* 38.4 - 49.9 % Final  . Platelets 01/15/2015 138* 140 - 400 10e3/uL Final  . MCV 01/15/2015 95.3  79.3 - 98.0 fL Final  . MCH 01/15/2015 31.7  27.2 - 33.4 pg Final  . MCHC 01/15/2015 33.3  32.0 - 36.0 g/dL Final  . RBC 01/15/2015 2.73* 4.20 - 5.82 10e6/uL Final  . RDW 01/15/2015 17.6* 11.0 - 14.6 % Final  . lymph# 01/15/2015 0.6* 0.9 - 3.3 10e3/uL Final  . MONO# 01/15/2015 0.6  0.1 - 0.9 10e3/uL Final  . Eosinophils Absolute 01/15/2015 0.1  0.0 - 0.5 10e3/uL Final  . Basophils Absolute 01/15/2015  0.0  0.0 - 0.1 10e3/uL Final  . NEUT% 01/15/2015 66.1  39.0 - 75.0 % Final  . LYMPH% 01/15/2015 15.4  14.0 - 49.0 % Final  . MONO% 01/15/2015 15.5* 0.0 - 14.0 % Final  . EOS% 01/15/2015 2.4  0.0 - 7.0 % Final  . BASO% 01/15/2015 0.6  0.0 - 2.0 % Final  . TSH 01/15/2015 1.982  0.320 - 4.118 m(IU)/L Final  . Sodium 01/15/2015 142  136 - 145 mEq/L Final  . Potassium 01/15/2015 4.3  3.5 - 5.1 mEq/L Final  . Chloride 01/15/2015  109  98 - 109 mEq/L Final  . CO2 01/15/2015 30* 22 - 29 mEq/L Final  . Glucose 01/15/2015 118  70 - 140 mg/dl Final  . BUN 01/15/2015 13.1  7.0 - 26.0 mg/dL Final  . Creatinine 01/15/2015 0.7  0.7 - 1.3 mg/dL Final  . Total Bilirubin 01/15/2015 1.31* 0.20 - 1.20 mg/dL Final  . Alkaline Phosphatase 01/15/2015 99  40 - 150 U/L Final  . AST 01/15/2015 13  5 - 34 U/L Final  . ALT 01/15/2015 12  0 - 55 U/L Final  . Total Protein 01/15/2015 5.8* 6.4 - 8.3 g/dL Final  . Albumin 01/15/2015 2.9* 3.5 - 5.0 g/dL Final  . Calcium 01/15/2015 9.0  8.4 - 10.4 mg/dL Final  . Anion Gap 01/15/2015 4  3 - 11 mEq/L Final  . EGFR 01/15/2015 90* >90 ml/min/1.73 m2 Final   eGFR is calculated using the CKD-EPI Creatinine Equation (2009)  . PSA 01/15/2015 2.91  <=4.00 ng/mL Final   Test Methodology: ECLIA PSA (Electrochemiluminescence Immunoassay) For PSA values from 2.5-4.0, particularly in younger men <65 yearsold, the AUA and NCCN suggest testing for % Free PSA (3515) andevaluation of the rate of increase in PSA (PSA velocity).   Doppler US: Right upper extremity venous duplex completed: No evidence of DVT. There appears to be superficial thrombosis in the cephalic vein in the forearm.   Bilateral lower extremity venous duplex completed. Right: DVT noted in the popliteal, gastrocnemius, posterior tibial, and peroneal veins. No evidence of superficial thrombosis. No Baker's cyst. Left: No evidence of DVT, superficial thrombosis, or Baker's cyst.   RADIOGRAPHIC STUDIES: No results found.  ASSESSMENT/PLAN:    Anemia in neoplastic disease  Blood counts revealed hgb down from 9.3 to 8.7.  Pt continues to c/o SOB as baseline; but denies any worsening dyspnea.  He is complaining of some mild increased fatigue however.  Will continue to monitor patient closely.     Dyspnea Patient suffers with chronic dyspnea as baseline.  Hemoglobin has decreased to 8.7; the patient denies any worsening dyspnea at  present.  He is complaining of some mild increased fatigue however.  We'll continue to monitor hemoglobin carefully.  Patient may require a blood transfusion if hemoglobin continues to decrease.  Hyperbilirubinemia Bilirubin scratch that patient has a history of chronic hyperbilirubinemia.  Bilirubin back up today to 1.31 from 1.21.  We'll continue to monitor closely.  Non-small cell carcinoma of lung, stage 4 Patient received his last cycle of Keytruda immunotherapy on 01/06/2015.  Patient is scheduled for a follow-up appointment with radiation oncologist Dr. Tammi Klippel on 01/19/2015.  Patient is scheduled for restaging CT on 01/22/2015.  Patient is scheduled for labs, follow up visit, and his next cycle of immunotherapy on 01/27/2015.  DVT (deep venous thrombosis) Patient is complaining of some increased fatigue; but states that his chronic dyspnea is stable.  He typically has some chronic peripheral edema as well;  but has noted a slight increased edema to his right leg within the past few days.  He denies any calf tenderness.  Also, patient has noted some edema to his right forearm as well.  He denies any chest pain, chest pressure, increasing dyspnea, or pain with inspiration.  On exam the patient has +1-2 peripheral edema to his bilateral lower extremities; with the right slightly increased when compared to the left.  No calf tenderness on exam.  Also, patient's right forearm with some edema as well.  There is no erythema, warmth, tenderness with palpation, or red streaks.  All pulses are palpable in all extremities are warm.  Doppler ultrasound obtained today revealed a DVT to the right lower extremity; and a superficial thrombus to the right upper extremity.  After consulting with Dr. Mohamed-decision was made to initiate Lovenox anticoagulation therapy.  Patient received his first Lovenox injection dosed at 1.5 mg/kg per day.  [place] nurse reviewed all Lovenox injection teaching with  both patient and his wife in detail.  Patient actually gave the first Lovenox injection to himself.    Lovenox prescribed to pt's pharmacy.   Patient stated understanding of all instructions; and was in agreement with this plan of care. The patient knows to call the clinic with any problems, questions or concerns.   This was a shared visit with Dr. Julien Nordmann today.   Total time spent with patient was 40 minutes;  with greater than 75 percent of that time spent in face to face counseling regarding patient's symptoms,  and coordination of care and follow up.  Disclaimer: This note was dictated with voice recognition software. Similar sounding words can inadvertently be transcribed and may not be corrected upon review.   Nicholas Second, NP 01/17/2015   ADDENDUM: Hematology/Oncology Attending: I had a face to face encounter with the patient. I recommended his care plan. This is a very pleasant 79 years old white male with metastatic non-small cell lung cancer currently undergoing treatment with immunotherapy with [place] status post 7 cycles. He is rating his treatment well but he presented today with swelling of the right upper and lower extremities that has been going on for several days. I recommended for the patient to have ultrasound Doppler of the right upper and lower extremities and unfortunately the results was consistent with deep venous thrombosis. We will start the patient on Lovenox 1.5 MG/KG subcutaneously daily. The patient will continue with his current treatment with [facility] as scheduled. He would come back for follow-up visit in 2 weeks for reevaluation before the next cycle of his treatment. He was advised to call immediately if he has any concerning symptoms in the interval.  Disclaimer: This note was dictated with voice recognition software. Similar sounding words can inadvertently be transcribed and may be missed upon review. Eilleen Kempf., MD 01/21/2015

## 2015-01-17 NOTE — Assessment & Plan Note (Signed)
Patient received his last cycle of Keytruda immunotherapy on 01/06/2015.  Patient is scheduled for a follow-up appointment with radiation oncologist Dr. Tammi Klippel on 01/19/2015.  Patient is scheduled for restaging CT on 01/22/2015.  Patient is scheduled for labs, follow up visit, and his next cycle of immunotherapy on 01/27/2015.

## 2015-01-17 NOTE — Assessment & Plan Note (Signed)
Patient is complaining of some increased fatigue; but states that his chronic dyspnea is stable.  He typically has some chronic peripheral edema as well; but has noted a slight increased edema to his right leg within the past few days.  He denies any calf tenderness.  Also, patient has noted some edema to his right forearm as well.  He denies any chest pain, chest pressure, increasing dyspnea, or pain with inspiration.  On exam the patient has +1-2 peripheral edema to his bilateral lower extremities; with the right slightly increased when compared to the left.  No calf tenderness on exam.  Also, patient's right forearm with some edema as well.  There is no erythema, warmth, tenderness with palpation, or red streaks.  All pulses are palpable in all extremities are warm.  Doppler ultrasound obtained today revealed a DVT to the right lower extremity; and a superficial thrombus to the right upper extremity.  After consulting with Dr. Mohamed-decision was made to initiate Lovenox anticoagulation therapy.  Patient received his first Lovenox injection dosed at 1.5 mg/kg per day.  [place] nurse reviewed all Lovenox injection teaching with both patient and his wife in detail.  Patient actually gave the first Lovenox injection to himself.    Lovenox prescribed to pt's pharmacy.

## 2015-01-17 NOTE — Assessment & Plan Note (Signed)
Bilirubin scratch that patient has a history of chronic hyperbilirubinemia.  Bilirubin back up today to 1.31 from 1.21.  We'll continue to monitor closely.

## 2015-01-17 NOTE — Assessment & Plan Note (Signed)
Patient suffers with chronic dyspnea as baseline.  Hemoglobin has decreased to 8.7; the patient denies any worsening dyspnea at present.  He is complaining of some mild increased fatigue however.  We'll continue to monitor hemoglobin carefully.  Patient may require a blood transfusion if hemoglobin continues to decrease.

## 2015-01-19 ENCOUNTER — Telehealth: Payer: Self-pay | Admitting: *Deleted

## 2015-01-19 ENCOUNTER — Ambulatory Visit
Admission: RE | Admit: 2015-01-19 | Discharge: 2015-01-19 | Disposition: A | Discharge status: Home / Self Care | Payer: Medicare Other | Source: Ambulatory Visit | Attending: Radiation Oncology | Admitting: Radiation Oncology

## 2015-01-19 ENCOUNTER — Encounter: Payer: Self-pay | Admitting: Radiation Oncology

## 2015-01-19 VITALS — BP 106/62 | HR 96 | Resp 18 | Wt 180.0 lb

## 2015-01-19 DIAGNOSIS — C7931 Secondary malignant neoplasm of brain: Secondary | ICD-10-CM

## 2015-01-19 NOTE — Addendum Note (Signed)
Encounter addended by: [name], MD on: 01/19/2015  3:23 PM<BR>     Documentation filed: Arn Medal VN

## 2015-01-19 NOTE — Telephone Encounter (Signed)
LM for pt to rtn call- following up on pt since starting Lovenox injections daily.

## 2015-01-19 NOTE — Progress Notes (Signed)
Radiation Oncology         [phone]) [phone] ________________________________  Name: Nicholas Schmitt MRN: [id number]  Date: 01/19/2015  DOB: 07/25/1935  Follow-Up Visit Note  CC: [name], MD  Curt Bears, MD  Diagnosis: 79 yo gentleman with a 2 cm left frontal brain metastasis from adenocarinoma of the right upper lung stage IV     ICD-9-CM ICD-10-CM   1. Brain metastasis 198.3 C79.31 Ambulatory referral to Physical Therapy    Interval Since Last Radiation:  2  months  Narrative:  The patient returns today for routine follow-up.  Weight and vitals stable. Denies pain. Reports that on 8/4 he was found to have two blood clots, right arm and right leg, by Selena Lesser. Reports taking lovenox injections daily to manage these blood clots. Denies headache, dizziness, nausea or vomiting. Denies diplopia or ringing in his ears. Reports an occasional productive cough with grayish sputum. Denies hemoptysis. Denies difficulty swallowing or sore throat. Reports SOB with exertion.  Patient states he is able to walk and stays relatively physically active.  Patient has underwent physical/ speech therapy as an inpatient. Patient is asking for outpatient referral.                          ALLERGIES:  is allergic to oxycodone hcl.  Meds: Current Outpatient Prescriptions  Medication Sig Dispense Refill  . acetaminophen (TYLENOL) 500 MG tablet Take 1,000 mg by mouth every 6 (six) hours as needed (pain).     [name] albuterol (PROVENTIL,VENTOLIN) 90 MCG/ACT inhaler Inhale 2 puffs into the lungs every 6 (six) hours as needed for wheezing or shortness of breath.    . budesonide-formoterol (SYMBICORT) 160-4.5 MCG/ACT inhaler Inhale 2 puffs into the lungs 2 (two) times daily. (Patient taking differently: Inhale 1 puff into the lungs 2 (two) times daily. )    . enoxaparin (LOVENOX) 120 MG/0.8ML injection Inject 0.8 mLs (120 mg total) into the skin daily. 30 Syringe 1  . famotidine (PEPCID) 20 MG tablet  Take 20 mg by mouth at bedtime.    . furosemide (LASIX) 20 MG tablet Take 1 tablet (20 mg total) by mouth daily. As needed for swelling. 30 tablet 0  . hydrocortisone (ANUSOL-HC) 2.5 % rectal cream Place 1 application rectally 2 (two) times daily. 30 g 3  . lactulose (CHRONULAC) 10 GM/15ML solution Take 15 mLs (10 g total) by mouth 2 (two) times daily as needed for mild constipation. 473 mL 1  . mirtazapine (REMERON) 30 MG tablet Take 1 tablet (30 mg total) by mouth at bedtime. 30 tablet 2  . Multiple Minerals (CALCIUM-MAGNESIUM-ZINC) TABS Take 1 tablet by mouth daily.    . Multiple Vitamin (MULTIVITAMIN WITH MINERALS) TABS tablet Take 1 tablet by mouth daily.    . mupirocin ointment (BACTROBAN) 2 % Apply 1 application topically 2 (two) times daily. 22 g 0  . nitroGLYCERIN (NITROSTAT) 0.4 MG SL tablet Place 0.4 mg under the tongue every 5 (five) minutes as needed for chest pain (MAX 3 TABLETS).    [name] omeprazole (PRILOSEC) 40 MG capsule Take 40 mg by mouth daily before supper.   0  . prochlorperazine (COMPAZINE) 10 MG tablet Take 10 mg by mouth every 6 (six) hours as needed for nausea or vomiting.    . Sennosides (EX-LAX) 15 MG CHEW Chew 15 mg by mouth at bedtime.    . tamsulosin (FLOMAX) 0.4 MG CAPS capsule Take 0.4 mg by mouth at bedtime.  2  . ALPRAZolam (XANAX) 0.25 MG tablet Take 1 tablet (0.25 mg total) by mouth at bedtime as needed for anxiety. (Patient not taking: Reported on 01/15/2015) 30 tablet 0  . dexamethasone (DECADRON) 2 MG tablet Take 1 tablet (2 mg total) by mouth 2 (two) times daily. (Patient not taking: Reported on 01/19/2015) 30 tablet 0   No current facility-administered medications for this encounter.   Facility-Administered Medications Ordered in Other Encounters  Medication Dose Route Frequency Provider Last Rate Last Dose  . heparin lock flush 100 unit/mL  500 Units Intracatheter Daily PRN Adrena E Johnson, PA-C      . sodium chloride 0.9 % injection 10 mL  10 mL  Intracatheter PRN Adrena E Johnson, PA-C      . sodium chloride 0.9 % injection 3 mL  3 mL Intracatheter PRN Carlton Adam, PA-C        Physical Findings: The patient is in no acute distress. Patient is alert and oriented.  weight is 180 lb (81.647 kg). His blood pressure is 106/62 and his pulse is 96. His respiration is 18 and oxygen saturation is 94%. .  No significant changes.  Skin: incisions looks well healed, no infection, no drainage Patient shows some signs of aphasisa as well as dysarthria.   Lab Findings: Lab Results  Component Value Date   WBC 3.7* 01/15/2015   WBC 2.2 Repeated and verified X2.* 12/29/2014   HGB 8.7* 01/15/2015   HGB 10.6* 12/29/2014   HCT 26.0* 01/15/2015   HCT 31.3* 12/29/2014   PLT 138* 01/15/2015   PLT 59.0 Repeated and verified X2.* 12/29/2014    Lab Results  Component Value Date   NA 142 01/15/2015   NA 135 11/28/2014   K 4.3 01/15/2015   K 4.8 11/28/2014   CHLORIDE 109 01/15/2015   CO2 30* 01/15/2015   CO2 25 11/28/2014   GLUCOSE 118 01/15/2015   GLUCOSE 111* 11/28/2014   BUN 13.1 01/15/2015   BUN 26* 11/28/2014   CREATININE 0.7 01/15/2015   CREATININE 0.73 11/28/2014   BILITOT 1.31* 01/15/2015   BILITOT 1.2 07/21/2014   ALKPHOS 99 01/15/2015   ALKPHOS 66 07/21/2014   AST 13 01/15/2015   AST 43* 07/21/2014   ALT 12 01/15/2015   ALT 37 07/21/2014   PROT 5.8* 01/15/2015   PROT 6.8 07/21/2014   ALBUMIN 2.9* 01/15/2015   ALBUMIN 3.0* 07/21/2014   CALCIUM 9.0 01/15/2015   CALCIUM 9.6 11/28/2014   ANIONGAP 4 01/15/2015   ANIONGAP 9 11/28/2014    Impression:  The patient is recovering from the effects of radiation and craniotomy.  Patient would benefit from physical therapy.  Plan:  I made a referral for outpatient physical therapy. Repeat MRI in a 1 month and follow-up.   _____________________________________  Sheral Apley. Tammi Klippel, M.D.   This document serves as a record of services personally performed by [name],  MD. It was created on his behalf by Derek Mound, a trained medical scribe. The creation of this record is based on the scribe's personal observations and the provider's statements to them. This document has been checked and approved by the attending provider.

## 2015-01-19 NOTE — Progress Notes (Signed)
Weight and vitals stable. Denies pain. Reports that on 8/4 he was found to have two blood clots, right arm and right leg, by Selena Lesser. Reports taking lovenox injections daily to manage these blood clots. Denies headache, dizziness, nausea or vomiting. Denies diplopia or ringing in his ears. Reports an occasional productive cough with grayish sputum. Denies hemoptysis. Denies difficulty swallowing or sore throat. Reports SOB with exertion.  BP 106/62 mmHg  Pulse 96  Resp 18  Wt 180 lb (81.647 kg)  SpO2 94% Wt Readings from Last 3 Encounters:  01/19/15 180 lb (81.647 kg)  01/15/15 180 lb 4.8 oz (81.784 kg)  01/06/15 180 lb (81.647 kg)

## 2015-01-20 ENCOUNTER — Telehealth: Payer: Self-pay | Admitting: *Deleted

## 2015-01-20 NOTE — Telephone Encounter (Signed)
CALLED PATIENT TO INFORM OF PT APPT. FOR 02-03-15- ARRIVAL TIME - 1:30 PM @ [facility], SPOKE WITH PATIENT AND HE IS AWARE OF THIS APPT.

## 2015-01-22 ENCOUNTER — Other Ambulatory Visit: Payer: Self-pay | Admitting: Nurse Practitioner

## 2015-01-22 ENCOUNTER — Ambulatory Visit (HOSPITAL_COMMUNITY)
Admission: RE | Admit: 2015-01-22 | Discharge: 2015-01-22 | Disposition: A | Discharge status: Home / Self Care | Payer: Medicare Other | Source: Ambulatory Visit | Attending: Nurse Practitioner | Admitting: Nurse Practitioner

## 2015-01-22 DIAGNOSIS — C7951 Secondary malignant neoplasm of bone: Secondary | ICD-10-CM | POA: Insufficient documentation

## 2015-01-22 DIAGNOSIS — J90 Pleural effusion, not elsewhere classified: Secondary | ICD-10-CM | POA: Insufficient documentation

## 2015-01-22 DIAGNOSIS — I872 Venous insufficiency (chronic) (peripheral): Secondary | ICD-10-CM | POA: Insufficient documentation

## 2015-01-22 DIAGNOSIS — C3490 Malignant neoplasm of unspecified part of unspecified bronchus or lung: Secondary | ICD-10-CM | POA: Insufficient documentation

## 2015-01-22 DIAGNOSIS — I714 Abdominal aortic aneurysm, without rupture: Secondary | ICD-10-CM | POA: Diagnosis not present

## 2015-01-22 DIAGNOSIS — I2699 Other pulmonary embolism without acute cor pulmonale: Secondary | ICD-10-CM | POA: Diagnosis not present

## 2015-01-22 MED ORDER — IOHEXOL 300 MG/ML  SOLN
100.0000 mL | Freq: Once | INTRAMUSCULAR | Status: AC | PRN
  Administered 2015-01-22: 100 mL via INTRAVENOUS

## 2015-01-23 ENCOUNTER — Other Ambulatory Visit: Payer: Self-pay | Admitting: Radiation Therapy

## 2015-01-23 DIAGNOSIS — C7931 Secondary malignant neoplasm of brain: Secondary | ICD-10-CM

## 2015-01-27 ENCOUNTER — Ambulatory Visit: Payer: Medicare Other | Admitting: Nutrition

## 2015-01-27 ENCOUNTER — Telehealth: Payer: Self-pay | Admitting: Physician Assistant

## 2015-01-27 ENCOUNTER — Other Ambulatory Visit: Payer: Self-pay | Admitting: *Deleted

## 2015-01-27 ENCOUNTER — Other Ambulatory Visit (HOSPITAL_BASED_OUTPATIENT_CLINIC_OR_DEPARTMENT_OTHER): Payer: Medicare Other

## 2015-01-27 ENCOUNTER — Ambulatory Visit (HOSPITAL_BASED_OUTPATIENT_CLINIC_OR_DEPARTMENT_OTHER): Payer: Medicare Other

## 2015-01-27 ENCOUNTER — Ambulatory Visit (HOSPITAL_BASED_OUTPATIENT_CLINIC_OR_DEPARTMENT_OTHER): Payer: Medicare Other | Admitting: Physician Assistant

## 2015-01-27 ENCOUNTER — Encounter: Payer: Self-pay | Admitting: Physician Assistant

## 2015-01-27 VITALS — BP 100/57 | HR 104 | Temp 97.6°F | Resp 17 | Ht 72.0 in | Wt 175.6 lb

## 2015-01-27 DIAGNOSIS — C3490 Malignant neoplasm of unspecified part of unspecified bronchus or lung: Secondary | ICD-10-CM

## 2015-01-27 DIAGNOSIS — C3491 Malignant neoplasm of unspecified part of right bronchus or lung: Secondary | ICD-10-CM | POA: Diagnosis not present

## 2015-01-27 DIAGNOSIS — C7951 Secondary malignant neoplasm of bone: Secondary | ICD-10-CM

## 2015-01-27 DIAGNOSIS — Z86718 Personal history of other venous thrombosis and embolism: Secondary | ICD-10-CM

## 2015-01-27 DIAGNOSIS — F321 Major depressive disorder, single episode, moderate: Secondary | ICD-10-CM

## 2015-01-27 DIAGNOSIS — Z86711 Personal history of pulmonary embolism: Secondary | ICD-10-CM | POA: Diagnosis not present

## 2015-01-27 DIAGNOSIS — C7931 Secondary malignant neoplasm of brain: Secondary | ICD-10-CM

## 2015-01-27 DIAGNOSIS — Z5112 Encounter for antineoplastic immunotherapy: Secondary | ICD-10-CM | POA: Diagnosis not present

## 2015-01-27 LAB — COMPREHENSIVE METABOLIC PANEL (CC13)
ALT: 14 U/L (ref 0–55)
ANION GAP: 9 meq/L (ref 3–11)
AST: 13 U/L (ref 5–34)
Albumin: 2.9 g/dL — ABNORMAL LOW (ref 3.5–5.0)
Alkaline Phosphatase: 81 U/L (ref 40–150)
BUN: 11.3 mg/dL (ref 7.0–26.0)
CHLORIDE: 107 meq/L (ref 98–109)
CO2: 26 mEq/L (ref 22–29)
CREATININE: 0.7 mg/dL (ref 0.7–1.3)
Calcium: 9.1 mg/dL (ref 8.4–10.4)
EGFR: 89 mL/min/{1.73_m2} — ABNORMAL LOW (ref >90)
Glucose: 94 mg/dl (ref 70–140)
Potassium: 3.9 mEq/L (ref 3.5–5.1)
Sodium: 142 mEq/L (ref 136–145)
Total Bilirubin: 1.05 mg/dL (ref 0.20–1.20)
Total Protein: 6.1 g/dL — ABNORMAL LOW (ref 6.4–8.3)

## 2015-01-27 LAB — CBC WITH DIFFERENTIAL/PLATELET
BASO%: 0.5 % (ref 0.0–2.0)
Basophils Absolute: 0 10*3/uL (ref 0.0–0.1)
EOS%: 3.6 % (ref 0.0–7.0)
Eosinophils Absolute: 0.1 10*3/uL (ref 0.0–0.5)
HCT: 27.9 % — ABNORMAL LOW (ref 38.4–49.9)
HGB: 8.9 g/dL — ABNORMAL LOW (ref 13.0–17.1)
LYMPH%: 14.1 % (ref 14.0–49.0)
MCH: 30.3 pg (ref 27.2–33.4)
MCHC: 32 g/dL (ref 32.0–36.0)
MCV: 94.7 fL (ref 79.3–98.0)
MONO#: 0.5 10*3/uL (ref 0.1–0.9)
MONO%: 12.1 % (ref 0.0–14.0)
NEUT#: 2.6 10*3/uL (ref 1.5–6.5)
NEUT%: 69.7 % (ref 39.0–75.0)
PLATELETS: 182 10*3/uL (ref 140–400)
RBC: 2.94 10*6/uL — ABNORMAL LOW (ref 4.20–5.82)
RDW: 18.1 % — ABNORMAL HIGH (ref 11.0–14.6)
WBC: 3.7 10*3/uL — ABNORMAL LOW (ref 4.0–10.3)
lymph#: 0.5 10*3/uL — ABNORMAL LOW (ref 0.9–3.3)

## 2015-01-27 MED ORDER — SODIUM CHLORIDE 0.9 % IV SOLN
2.0000 mg/kg | Freq: Once | INTRAVENOUS | Status: AC
  Administered 2015-01-27: 150 mg via INTRAVENOUS
  Filled 2015-01-27: qty 6

## 2015-01-27 MED ORDER — PROCHLORPERAZINE MALEATE 10 MG PO TABS
ORAL_TABLET | ORAL | Status: AC
  Filled 2015-01-27: qty 1

## 2015-01-27 MED ORDER — PROCHLORPERAZINE MALEATE 10 MG PO TABS
10.0000 mg | ORAL_TABLET | Freq: Once | ORAL | Status: AC
  Administered 2015-01-27: 10 mg via ORAL

## 2015-01-27 MED ORDER — SODIUM CHLORIDE 0.9 % IV SOLN
Freq: Once | INTRAVENOUS | Status: AC
  Administered 2015-01-27: 11:00:00 via INTRAVENOUS

## 2015-01-27 NOTE — Patient Instructions (Signed)
[facility] Discharge Instructions for Patients Receiving Chemotherapy  Today you received the following chemotherapy agents keytruda.  To help prevent nausea and vomiting after your treatment, we encourage you to take your nausea medication as directed.    If you develop nausea and vomiting that is not controlled by your nausea medication, call the clinic.   BELOW ARE SYMPTOMS THAT SHOULD BE REPORTED IMMEDIATELY:  *FEVER GREATER THAN 100.5 F  *CHILLS WITH OR WITHOUT FEVER  NAUSEA AND VOMITING THAT IS NOT CONTROLLED WITH YOUR NAUSEA MEDICATION  *UNUSUAL SHORTNESS OF BREATH  *UNUSUAL BRUISING OR BLEEDING  TENDERNESS IN MOUTH AND THROAT WITH OR WITHOUT PRESENCE OF ULCERS  *URINARY PROBLEMS  *BOWEL PROBLEMS  UNUSUAL RASH Items with * indicate a potential emergency and should be followed up as soon as possible.  Feel free to call the clinic you have any questions or concerns. The clinic phone number is [phone].  Please show the [facility] at check-in to the Emergency Department and triage nurse.

## 2015-01-27 NOTE — Telephone Encounter (Signed)
Gave avs & calendar for September °

## 2015-01-27 NOTE — Progress Notes (Signed)
Follow-up completed with patient during infusion for stage IV lung cancer. Weight has decreased and was documented as 175.6 pounds August 16 decreased from 180 pounds August 8. Patient reports his appetite is fair. He is trying to drink one oral nutrition supplement daily.  He usually chooses boost or Carnation breakfast. Patient denies other nutrition impact symptoms.  Nutrition diagnosis: Food and nutrition related knowledge deficit continues.  Intervention: Educated patient to continue small frequent meals and snacks. Recommended patient continue oral nutrition supplements once to twice a day. Provided coupons for boost and Carnation breakfast essentials. Questions answered and teach back method used.  Monitoring, evaluation, goals: Patient will tolerate adequate calories and protein to promote weight maintenance.  Next visit: Tuesday, September 27, during infusion.  **Disclaimer: This note was dictated with voice recognition software. Similar sounding words can inadvertently be transcribed and this note may contain transcription errors which may not have been corrected upon publication of note.**

## 2015-01-27 NOTE — Progress Notes (Addendum)
[facility] [phone]   [phone]  OFFICE PROGRESS NOTE  [name],[name] [name], MD [place] [address]  DIAGNOSIS: Non-small cell carcinoma of lung, stage 4  Primary site: Lung (Right)  Staging method: AJCC 7th Edition  Clinical: Stage IV (T1a, N3, M1b) signed by Curt Bears, MD on 02/01/2014 1:42 PM  Summary: Stage IV (T1a, N3, M1b) Prostate cancer  Primary site: Prostate  Clinical: Stage I (T1c, NX, M0) signed by Wyatt Portela, MD on 08/06/2013 1:59 PM  Summary: Stage I (T1c, NX, M0)  Pulmonary embolus and lower extremity DVT 01/22/2015  PRIOR THERAPY:  1) Systemic chemotherapy with carboplatin for an AUC of 5 and Alimta 500 mg per meter squared given every 3 weeks. Status post 6 cycles, last dose was given 05/20/2014 discontinued secondary to disease progression. 2) palliative radiotherapy to the right lung and lumbar spines under the care of Dr. Sondra Come. 3) status post craniotomy with tumor excision under the care of Dr. Vertell Limber on 12/04/2014.  CURRENT THERAPY: Immunotherapy with Ketruda (pembrolizumab) 2 MG/KG every 3 weeks. First dose 07/29/2014. He has positive PDL 1 expression (90%). Status post 7 cycles  INTERVAL HISTORY: Nicholas Schmitt 79 y.o. male returns to the clinic today for follow-up visit accompanied by his wife. He is feeling fine today with no specific complaints except for mild fatigue. He reports significant improvement in the swelling in his neck and extremities. He is not fond of the Lovenox injections.  He denied having any significant fever or chills, no nausea or vomiting, no diarrhea. The patient denied having any significant chest pain, shortness of breath except with exertion, cough or hemoptysis. He is here for evaluation and to proceed with cycle #8 of immunotherapy.  MEDICAL HISTORY: Past Medical History  Diagnosis Date  . CAD (coronary artery disease)     positive stress  test in 2006 led to left heart cath (8/06) showing 95% prox RCA, 90% CFX, and 90% mLAD.  patient had a cypher DES to all 3 lesions  . Hyperlipidemia   . Hearing loss   . AAA (abdominal aortic aneurysm) 11/09    3.2 cm   . Chronic renal insufficiency   . History of radiation therapy 03/08/04- 04/08/04    prostate 4600 cGy in 3 fractions, radioactive seed implant 05/04/04  . Myocardial infarction 1995  . Pacemaker 2014  . Dysrhythmia   . Shortness of breath     exertion  . Asthma     bronchial  . COPD (chronic obstructive pulmonary disease)   . Radiation Jan.11-Jan 29/2016    Right central chest 35 gray in 14 fractions  . Radiation Jan.2016    35 gray in 14 fx lower lumbar/upper sacrum  . Diverticulosis   . Hemorrhoids   . Pneumonia     hx  . GERD (gastroesophageal reflux disease)   . Hypertension     no med now for 3-4 months  . Cancer 2015    prostate   . Prostate cancer 2005    gleason 7  . Skin cancer     ear  . Lung cancer   . Cancer December 04, 2014    Cranial     ALLERGIES:  is allergic to oxycodone hcl.  MEDICATIONS:  Current Outpatient Prescriptions  Medication Sig Dispense Refill  . acetaminophen (TYLENOL) 500 MG tablet Take 1,000 mg by mouth every 6 (six) hours as needed (pain).     [name] albuterol (  PROVENTIL,VENTOLIN) 90 MCG/ACT inhaler Inhale 2 puffs into the lungs every 6 (six) hours as needed for wheezing or shortness of breath.    . ALPRAZolam (XANAX) 0.25 MG tablet Take 1 tablet (0.25 mg total) by mouth at bedtime as needed for anxiety. 30 tablet 0  . budesonide-formoterol (SYMBICORT) 160-4.5 MCG/ACT inhaler Inhale 2 puffs into the lungs 2 (two) times daily. (Patient taking differently: Inhale 1 puff into the lungs 2 (two) times daily. )    . dexamethasone (DECADRON) 2 MG tablet Take 1 tablet (2 mg total) by mouth 2 (two) times daily. 30 tablet 0  . enoxaparin (LOVENOX) 120 MG/0.8ML injection Inject 0.8 mLs (120 mg total) into the skin daily. 30 Syringe 1  .  famotidine (PEPCID) 20 MG tablet Take 20 mg by mouth at bedtime.    . furosemide (LASIX) 20 MG tablet Take 1 tablet (20 mg total) by mouth daily. As needed for swelling. 30 tablet 0  . hydrocortisone (ANUSOL-HC) 2.5 % rectal cream Place 1 application rectally 2 (two) times daily. 30 g 3  . lactulose (CHRONULAC) 10 GM/15ML solution Take 15 mLs (10 g total) by mouth 2 (two) times daily as needed for mild constipation. 473 mL 1  . mirtazapine (REMERON) 30 MG tablet Take 1 tablet (30 mg total) by mouth at bedtime. 30 tablet 2  . Multiple Minerals (CALCIUM-MAGNESIUM-ZINC) TABS Take 1 tablet by mouth daily.    . Multiple Vitamin (MULTIVITAMIN WITH MINERALS) TABS tablet Take 1 tablet by mouth daily.    . mupirocin ointment (BACTROBAN) 2 % Apply 1 application topically 2 (two) times daily. 22 g 0  . nitroGLYCERIN (NITROSTAT) 0.4 MG SL tablet Place 0.4 mg under the tongue every 5 (five) minutes as needed for chest pain (MAX 3 TABLETS).    [name] omeprazole (PRILOSEC) 40 MG capsule Take 40 mg by mouth daily before supper.   0  . prochlorperazine (COMPAZINE) 10 MG tablet Take 10 mg by mouth every 6 (six) hours as needed for nausea or vomiting.    . Sennosides (EX-LAX) 15 MG CHEW Chew 15 mg by mouth at bedtime.    . tamsulosin (FLOMAX) 0.4 MG CAPS capsule Take 0.4 mg by mouth at bedtime.   2   No current facility-administered medications for this visit.   Facility-Administered Medications Ordered in Other Visits  Medication Dose Route Frequency Provider Last Rate Last Dose  . heparin lock flush 100 unit/mL  500 Units Intracatheter Daily PRN Shoua Ressler E Alif Petrak, PA-C      . sodium chloride 0.9 % injection 10 mL  10 mL Intracatheter PRN Emilly Lavey E Jrue Jarriel, PA-C      . sodium chloride 0.9 % injection 3 mL  3 mL Intracatheter PRN Carlton Adam, PA-C        SURGICAL HISTORY:  Past Surgical History  Procedure Laterality Date  . Ptca  2006  . Coronary stent placement    . Permanent pacemaker insertion Bilateral  02/26/13    MDT Adapta L implanted by Dr Rayann Heman for sick sinus syndrome  . Radioactive seed implant  05/04/2004    8000 cGy, Dr Danny Lawless Dr Reece Agar  . Elbow surgery Bilateral 1973  . Coronary angioplasty    . Mediastinoscopy N/A 01/28/2014    Procedure: MEDIASTINOSCOPY;  Surgeon: Melrose Nakayama, MD;  Location: [facility];  Service: Thoracic;  Laterality: N/A;  . Permanent pacemaker insertion N/A 02/26/2013    Procedure: PERMANENT PACEMAKER INSERTION;  Surgeon: [name], MD;  Location: [facility];  Service: Cardiovascular;  Laterality: N/A;  . Insert / replace / remove pacemaker      not removed  . Craniotomy N/A 12/04/2014    Procedure: CRANIOTOMY TUMOR EXCISION, LEFT;  Surgeon: [name], MD;  Location: [facility];  Service: Neurosurgery;  Laterality: N/A;  CRANIOTOMY TUMOR EXCISION  . Application of cranial navigation  12/04/2014    Procedure: APPLICATION OF CRANIAL NAVIGATION;  Surgeon: [name], MD;  Location: [facility];  Service: Neurosurgery;;    REVIEW OF SYSTEMS:  Constitutional: positive for fatigue Eyes: negative Ears, nose, mouth, throat, and face: negative Respiratory: positive for dyspnea on exertion Cardiovascular: negative Gastrointestinal: negative Genitourinary:negative Integument/breast: negative Hematologic/lymphatic: negative Musculoskeletal:negative Neurological: negative Behavioral/Psych: negative Endocrine: negative Allergic/Immunologic: negative   PHYSICAL EXAMINATION: General appearance: alert, cooperative, fatigued and no distress Head: Normocephalic, without obvious abnormality, atraumatic Neck: no adenopathy, no carotid bruit, supple, symmetrical, trachea midline and thyroid not enlarged, symmetric, no tenderness/mass/nodules Lymph nodes: Cervical, supraclavicular, and axillary nodes normal. Resp: clear to auscultation bilaterally Back: symmetric, no curvature. ROM normal. No CVA tenderness. Cardio: regular rate and rhythm, S1, S2  normal, no murmur, click, rub or gallop GI: soft, non-tender; bowel sounds normal; no masses,  no organomegaly Extremities: extremities normal, atraumatic, no cyanosis or edema Neurologic: Alert and oriented X 3, normal strength and tone. Normal symmetric reflexes. Normal coordination and gait  ECOG PERFORMANCE STATUS: 1 - Symptomatic but completely ambulatory  Blood pressure 100/57, pulse 104, temperature 97.6 F (36.4 C), temperature source Oral, resp. rate 17, height 6' (1.829 m), weight 175 lb 9.6 oz (79.652 kg), SpO2 96 %.  LABORATORY DATA: Lab Results  Component Value Date   WBC 3.7* 01/27/2015   HGB 8.9* 01/27/2015   HCT 27.9* 01/27/2015   MCV 94.7 01/27/2015   PLT 182 01/27/2015      Chemistry      Component Value Date/Time   NA 142 01/27/2015 0934   NA 135 11/28/2014 1353   K 3.9 01/27/2015 0934   K 4.8 11/28/2014 1353   CL 101 11/28/2014 1353   CO2 26 01/27/2015 0934   CO2 25 11/28/2014 1353   BUN 11.3 01/27/2015 0934   BUN 26* 11/28/2014 1353   CREATININE 0.7 01/27/2015 0934   CREATININE 0.73 11/28/2014 1353      Component Value Date/Time   CALCIUM 9.1 01/27/2015 0934   CALCIUM 9.6 11/28/2014 1353   ALKPHOS 81 01/27/2015 0934   ALKPHOS 66 07/21/2014 1821   AST 13 01/27/2015 0934   AST 43* 07/21/2014 1821   ALT 14 01/27/2015 0934   ALT 37 07/21/2014 1821   BILITOT 1.05 01/27/2015 0934   BILITOT 1.2 07/21/2014 1821       RADIOGRAPHIC STUDIES: Ct Chest W Contrast  01/22/2015   CLINICAL DATA:  Lung cancer diagnosed 8/15 with metastasis. Prostate cancer in 2014. Ongoing chemotherapy. Ex-smoker; quit in 1988.  EXAM: CT CHEST, ABDOMEN, AND PELVIS WITH CONTRAST  TECHNIQUE: Multidetector CT imaging of the chest, abdomen and pelvis was performed following the standard protocol during bolus administration of intravenous contrast.  CONTRAST:  143m OMNIPAQUE IOHEXOL 300 MG/ML  SOLN  COMPARISON:  11/25/2014  FINDINGS: CT CHEST FINDINGS  Mediastinum/Nodes: No  supraclavicular adenopathy. Aortic and branch vessel atherosclerosis. Normal heart size, without pericardial effusion. Filling defects within left lower lobe pulmonary artery branches, including on image 32 of series 3. Nonocclusive.  Central [facility] at [facility] This is progressive, with increase collateral veins identified about the mediastinum.  Soft  tissue infiltration throughout the mediastinum again identified. An example area measures 1.9 cm within the right paratracheal space today on image 15 and is felt to be similar. No well-defined hilar adenopathy. Fat containing left-sided Bochdalek's hernia.  Lungs/Pleura: Moderate right-sided pleural effusion, increased. No evidence of pleural mass. Secretions within the trachea and right mainstem bronchus. Similar mass effect upon right lower lobe bronchi, which remain patent.  Mild centrilobular emphysema. Diminished right lung volume, secondary to pleural fluid. Given this difference, similar appearance of paramediastinal radiation fibrosis.  Musculoskeletal: No osseous metastasis identified.  CT ABDOMEN AND PELVIS FINDINGS  Hepatobiliary: Scattered hepatic cysts. Normal gallbladder, without biliary ductal dilatation.  Pancreas: Normal, without mass or ductal dilatation.  Spleen: Normal  Adrenals/Urinary Tract: Mild bilateral adrenal nodularity, without dominant mass. Mild renal cortical thinning bilaterally. Left renal cysts and right renal too small to characterize lesions. No hydronephrosis. Normal urinary bladder.  Stomach/Bowel: Normal stomach, without wall thickening. Normal colon and terminal ileum. Normal small bowel.  Vascular/Lymphatic: Aortic and branch vessel atherosclerosis. Infrarenal abdominal aortic aneurysm again identified. Similar, measuring maximally 3.5 x 3.3 cm. No surrounding hemorrhage. No abdominopelvic adenopathy.  Reproductive: Radiation seeds in the prostate  Other: No significant free fluid.   Musculoskeletal: Insufficiency or pathologic fracture involving the right side of the sacrum is similar. A lytic lesion within the right side of the L5 vertebral body is increased in conspicuity, including a 2.3 cm on image 90 of series 3.  IMPRESSION: CT CHEST IMPRESSION  1. Radiation fibrosis within the paramediastinal right lung, with similar soft tissue infiltration in the right-sided mediastinum. Favored to be radiation induced. 2. Increase in moderate right pleural effusion. 3. Left lower lobe pulmonary emboli. Critical test results telephoned to Dr[facility] Second. at the time of interpretation at . 11:25 a.m.on 01/22/2015. 4. Progressive central venous insufficiency, likely at the SVC.  CT ABDOMEN AND PELVIS IMPRESSION  1. Slight progression of osseous metastasis at L5. 2. No extraosseous disease progression identified; similar right adrenal nodularity. 3. Infrarenal abdominal aortic aneurysm, similar.   Electronically Signed   By: Abigail Miyamoto M.D.   On: 01/22/2015 11:35   Ct Abdomen Pelvis W Contrast  01/22/2015   CLINICAL DATA:  Lung cancer diagnosed 8/15 with metastasis. Prostate cancer in 2014. Ongoing chemotherapy. Ex-smoker; quit in 1988.  EXAM: CT CHEST, ABDOMEN, AND PELVIS WITH CONTRAST  TECHNIQUE: Multidetector CT imaging of the chest, abdomen and pelvis was performed following the standard protocol during bolus administration of intravenous contrast.  CONTRAST:  138m OMNIPAQUE IOHEXOL 300 MG/ML  SOLN  COMPARISON:  11/25/2014  FINDINGS: CT CHEST FINDINGS  Mediastinum/Nodes: No supraclavicular adenopathy. Aortic and branch vessel atherosclerosis. Normal heart size, without pericardial effusion. Filling defects within left lower lobe pulmonary artery branches, including on image 32 of series 3. Nonocclusive.  Central [facility] This is progressive, with increase collateral veins identified about the mediastinum.  Soft tissue infiltration throughout the  mediastinum again identified. An example area measures 1.9 cm within the right paratracheal space today on image 15 and is felt to be similar. No well-defined hilar adenopathy. Fat containing left-sided Bochdalek's hernia.  Lungs/Pleura: Moderate right-sided pleural effusion, increased. No evidence of pleural mass. Secretions within the trachea and right mainstem bronchus. Similar mass effect upon right lower lobe bronchi, which remain patent.  Mild centrilobular emphysema. Diminished right lung volume, secondary to pleural fluid. Given this difference, similar appearance of paramediastinal radiation fibrosis.  Musculoskeletal: No osseous metastasis identified.  CT ABDOMEN  AND PELVIS FINDINGS  Hepatobiliary: Scattered hepatic cysts. Normal gallbladder, without biliary ductal dilatation.  Pancreas: Normal, without mass or ductal dilatation.  Spleen: Normal  Adrenals/Urinary Tract: Mild bilateral adrenal nodularity, without dominant mass. Mild renal cortical thinning bilaterally. Left renal cysts and right renal too small to characterize lesions. No hydronephrosis. Normal urinary bladder.  Stomach/Bowel: Normal stomach, without wall thickening. Normal colon and terminal ileum. Normal small bowel.  Vascular/Lymphatic: Aortic and branch vessel atherosclerosis. Infrarenal abdominal aortic aneurysm again identified. Similar, measuring maximally 3.5 x 3.3 cm. No surrounding hemorrhage. No abdominopelvic adenopathy.  Reproductive: Radiation seeds in the prostate  Other: No significant free fluid.  Musculoskeletal: Insufficiency or pathologic fracture involving the right side of the sacrum is similar. A lytic lesion within the right side of the L5 vertebral body is increased in conspicuity, including a 2.3 cm on image 90 of series 3.  IMPRESSION: CT CHEST IMPRESSION  1. Radiation fibrosis within the paramediastinal right lung, with similar soft tissue infiltration in the right-sided mediastinum. Favored to be radiation  induced. 2. Increase in moderate right pleural effusion. 3. Left lower lobe pulmonary emboli. Critical test results telephoned to Dr[facility] Second. at the time of interpretation at . 11:25 a.m.on 01/22/2015. 4. Progressive central venous insufficiency, likely at the SVC.  CT ABDOMEN AND PELVIS IMPRESSION  1. Slight progression of osseous metastasis at L5. 2. No extraosseous disease progression identified; similar right adrenal nodularity. 3. Infrarenal abdominal aortic aneurysm, similar.   Electronically Signed   By: Abigail Miyamoto M.D.   On: 01/22/2015 11:35    ASSESSMENT AND PLAN: this is a very pleasant 79 years old white male with stage IV non-small cell lung cancer, adenocarcinoma with positive PDL 1 expression, completed systemic chemotherapy with carboplatin and Alimta status post 6 cycles and tolerated his treatment fairly well.  He is currently on treatment with immunotherapy with Ketruda (pembrolizumab) status post 7 cycles with stable disease in the chest, abdomen and pelvis. Unfortunately the patient was found to have metastatic brain lesions and he underwent craniotomy with resection of the brain tumor.  The patient is feeling better today is on Lovenox for his recent diagnosis of pulmonary embolism and lower extremity deep vein thrombosis. Patient was discussed with and also seen by Dr. Julien Nordmann. The plan is to continue him on Lovenox for at least 3 months prior to switching to an oral agent for anticoagulation. He'll proceed with cycle #8 of  his immunotherapy with Nat Math (pembrolizumab). He will follow-up in 3 weeks prior to start of cycle #9.  He will continue on Remeron 30 mg by mouth daily at bedtime.  He was advised to call if he has any concerning symptoms in the interval. The patient was advised to call immediately if he has any concerning symptoms in the interval. The patient voices understanding of current disease status and treatment options and is in agreement with the current care  plan.  All questions were answered. The patient knows to call the clinic with any problems, questions or concerns. We can certainly see the patient much sooner if necessary.  Carlton Adam, PA-C 01/27/2015 ADDENDUM: Hematology/Oncology Attending: I had a face to face encounter with the patient. I recommended his care plan. This is a very pleasant 79 years old white male with a stage IV non-small cell lung cancer who is currently undergoing treatment with immunotherapy with Ketruda (pembrolizumab) status post 7 cycles and tolerating his treatment fairly well. I recommended for the patient to continue his current treatment  with Nat Math (pembrolizumab) as a scheduled and he will receive cycle #8 today. He would come back for follow-up visit in 3 weeks for reevaluation before starting cycle #9. For the recently diagnosed deep venous thrombosis, the patient will continue on Lovenox. For depression, he will continue on Remeron for now. The patient was advised to call immediately if he has any concerning symptoms in the interval.  Disclaimer: This note was dictated with voice recognition software. Similar sounding words can inadvertently be transcribed and may be missed upon review. Eilleen Kempf., MD 01/31/2015

## 2015-01-30 NOTE — Patient Instructions (Signed)
Continue Lovenox injections as prescribed Follow-up in 3 weeks prior to next scheduled cycle of immunotherapy

## 2015-02-03 ENCOUNTER — Ambulatory Visit: Payer: Medicare Other | Admitting: Physical Therapy

## 2015-02-04 ENCOUNTER — Telehealth: Payer: Self-pay | Admitting: Medical Oncology

## 2015-02-04 DIAGNOSIS — C3490 Malignant neoplasm of unspecified part of unspecified bronchus or lung: Secondary | ICD-10-CM

## 2015-02-04 DIAGNOSIS — C7931 Secondary malignant neoplasm of brain: Secondary | ICD-10-CM

## 2015-02-04 NOTE — Telephone Encounter (Signed)
Pt  having a hard time with pain in leg and not sleeping well.I instructed him to take his oxycodone now. He gets nauseated with oxycodone so he takes compazine with it.I spoke to June and she reviewed how he is taking his meds. He takes Remeron q HS . If needed he takes oxycodone and compazine HS . She reported and so did pt that he did well last night .

## 2015-02-05 ENCOUNTER — Telehealth: Payer: Self-pay | Admitting: *Deleted

## 2015-02-05 ENCOUNTER — Other Ambulatory Visit: Payer: Self-pay | Admitting: *Deleted

## 2015-02-05 DIAGNOSIS — C3490 Malignant neoplasm of unspecified part of unspecified bronchus or lung: Secondary | ICD-10-CM

## 2015-02-05 NOTE — Telephone Encounter (Signed)
Pt girlfriend called and advised lovenox will not be refilled because insurance has denied the request. Called pt pharmacy to get PA# and contact information to [facility] ' Pt had PE and lower extremity DVT 01/22/15. [employer] [phone]   Placed call to pt Insurance for PA on Lovenox. Rep requested ppwk online be filled out and faxed.  Ppwk completed. Faxed to  [phone]  Called June Willard pt friend-advised PA ppwk has bee faxed to [facility] and pt pharmacy should receive the approval and call pt when ready for pick up. No further concerns.

## 2015-02-05 NOTE — Telephone Encounter (Signed)
Nicholas Schmitt HAD PT.'S PHONE NUMBER SO WAS ABLE TO FIND PT. IN THE SYSTEM AND PROVIDE PT.'S DATE OF BIRTH.

## 2015-02-09 ENCOUNTER — Other Ambulatory Visit: Payer: Self-pay | Admitting: Medical Oncology

## 2015-02-09 DIAGNOSIS — C7931 Secondary malignant neoplasm of brain: Secondary | ICD-10-CM

## 2015-02-09 NOTE — Progress Notes (Signed)
I called Nicholas Schmitt back about requests for oxycodone. Pt has enough until he comes next week.

## 2015-02-10 ENCOUNTER — Telehealth: Payer: Self-pay | Admitting: Medical Oncology

## 2015-02-10 ENCOUNTER — Other Ambulatory Visit: Payer: Self-pay | Admitting: Internal Medicine

## 2015-02-10 NOTE — Telephone Encounter (Signed)
Message sent to Dr Tammi Klippel and his nurse regarding referrals .

## 2015-02-11 ENCOUNTER — Encounter: Payer: Self-pay | Admitting: Internal Medicine

## 2015-02-11 NOTE — Progress Notes (Signed)
Per [employer] enoxparin has been approved 02/05/15-02/05/16. I will send to medical records.

## 2015-02-12 ENCOUNTER — Other Ambulatory Visit: Payer: Self-pay | Admitting: Medical Oncology

## 2015-02-12 ENCOUNTER — Ambulatory Visit: Payer: Medicare Other

## 2015-02-12 ENCOUNTER — Ambulatory Visit: Payer: Medicare Other | Admitting: Physical Therapy

## 2015-02-12 ENCOUNTER — Ambulatory Visit: Payer: Medicare Other | Attending: Internal Medicine | Admitting: Occupational Therapy

## 2015-02-12 DIAGNOSIS — R29898 Other symptoms and signs involving the musculoskeletal system: Secondary | ICD-10-CM | POA: Insufficient documentation

## 2015-02-12 DIAGNOSIS — R4701 Aphasia: Secondary | ICD-10-CM | POA: Insufficient documentation

## 2015-02-12 DIAGNOSIS — R2681 Unsteadiness on feet: Secondary | ICD-10-CM

## 2015-02-12 DIAGNOSIS — R279 Unspecified lack of coordination: Secondary | ICD-10-CM | POA: Diagnosis present

## 2015-02-12 DIAGNOSIS — R6889 Other general symptoms and signs: Secondary | ICD-10-CM | POA: Insufficient documentation

## 2015-02-12 DIAGNOSIS — R269 Unspecified abnormalities of gait and mobility: Secondary | ICD-10-CM | POA: Diagnosis present

## 2015-02-12 DIAGNOSIS — M6281 Muscle weakness (generalized): Secondary | ICD-10-CM | POA: Diagnosis present

## 2015-02-12 DIAGNOSIS — M6289 Other specified disorders of muscle: Secondary | ICD-10-CM | POA: Diagnosis present

## 2015-02-12 NOTE — Therapy (Signed)
[facility] [address] [place], [place], [address] Phone: [phone]   Fax:  [phone]  Speech Language Pathology Evaluation  Patient Details  Name: Nicholas Schmitt MRN: [id number] Date of Birth: [birth year] Referring Provider:  [name], MD  Encounter Date: 02/12/2015      End of Session - 02/12/15 1612    Visit Number 1   Number of Visits 16   Date for SLP Re-Evaluation 04/13/15   Authorization Type G-Code   SLP Start Time 1535   SLP Stop Time  1615   SLP Time Calculation (min) 40 min   Activity Tolerance Patient tolerated treatment well      Past Medical History  Diagnosis Date  . CAD (coronary artery disease)     positive stress test in 2006 led to left heart cath (8/06) showing 95% prox RCA, 90% CFX, and 90% mLAD.  patient had a cypher DES to all 3 lesions  . Hyperlipidemia   . Hearing loss   . AAA (abdominal aortic aneurysm) 11/09    3.2 cm   . Chronic renal insufficiency   . History of radiation therapy 03/08/04- 04/08/04    prostate 4600 cGy in 3 fractions, radioactive seed implant 05/04/04  . Myocardial infarction 1995  . Pacemaker 2014  . Dysrhythmia   . Shortness of breath     exertion  . Asthma     bronchial  . COPD (chronic obstructive pulmonary disease)   . Radiation Jan.11-Jan 29/2016    Right central chest 35 gray in 14 fractions  . Radiation Jan.2016    35 gray in 14 fx lower lumbar/upper sacrum  . Diverticulosis   . Hemorrhoids   . Pneumonia     hx  . GERD (gastroesophageal reflux disease)   . Hypertension     no med now for 3-4 months  . Cancer 2015    prostate   . Prostate cancer 2005    gleason 7  . Skin cancer     ear  . Lung cancer   . Cancer December 04, 2014    Cranial     Past Surgical History  Procedure Laterality Date  . Ptca  2006  . Coronary stent placement    . Permanent pacemaker insertion Bilateral 02/26/13    MDT Adapta L implanted by Dr Rayann Heman  for sick sinus syndrome  . Radioactive seed implant  05/04/2004    8000 cGy, Dr Danny Lawless Dr Reece Agar  . Elbow surgery Bilateral 1973  . Coronary angioplasty    . Mediastinoscopy N/A 01/28/2014    Procedure: MEDIASTINOSCOPY;  Surgeon: Melrose Nakayama, MD;  Location: [facility];  Service: Thoracic;  Laterality: N/A;  . Permanent pacemaker insertion N/A 02/26/2013    Procedure: PERMANENT PACEMAKER INSERTION;  Surgeon: [name], MD;  Location: [facility];  Service: Cardiovascular;  Laterality: N/A;  . Insert / replace / remove pacemaker      not removed  . Craniotomy N/A 12/04/2014    Procedure: CRANIOTOMY TUMOR EXCISION, LEFT;  Surgeon: [name], MD;  Location: [facility];  Service: Neurosurgery;  Laterality: N/A;  CRANIOTOMY TUMOR EXCISION  . Application of cranial navigation  12/04/2014    Procedure: APPLICATION OF CRANIAL NAVIGATION;  Surgeon: [name], MD;  Location: [facility];  Service: Neurosurgery;;    There were no vitals filed for this visit.  Visit Diagnosis: Expressive aphasia  Receptive aphasia      Subjective Assessment - 02/12/15 1539  Subjective "Had a blood clot in my arm-leg."            SLP Evaluation [facility] - 02/12/15 1541    SLP Visit Information   SLP Received On 02/12/15   Pain Assessment   Currently in Pain? Yes   Pain Score 5    Pain Location Calf   Pain Orientation Right   Pain Type Chronic pain   Pain Onset More than a month ago   Pain Frequency Constant   Pain Relieving Factors medication   Effect of Pain on Daily Activities difficulty sleeping   Prior Functional Status   Cognitive/Linguistic Baseline Within functional limits   Type of Home House    Lives With Significant other   Available Support Family;Friend(s)   Education GED  "I got a G-I-D"   Vocation Retired  from [employer] at [employer]   Overall Cognitive Status No family/caregiver present to determine baseline cognitive functioning   Auditory  Comprehension   Overall Auditory Comprehension Impaired   Commands Impaired   Multistep Basic Commands 50-74% accurate   Complex Commands 50-74% accurate   Conversation Simple   Other Conversation Comments In conversation necessitating higher linguistic complexity, pt's comprehension was not always 100%   EffectiveTechniques Repetition   Reading Comprehension   Reading Status Impaired   Sentence Level 76-100% accurate   Paragraph Level 0-25% accurate   Expression   Primary Mode of Expression Verbal   Verbal Expression   Overall Verbal Expression Impaired   Initiation No impairment   Automatic Speech Counting;Day of week;Month of year  aphasic responses (semantic) with months 50%    Level of Generative/Spontaneous Verbalization Conversation;Sentence   Repetition Impaired   Level of Impairment Sentence level   Naming Impairment   Responsive 76-100% accurate  80%   Confrontation 50-74% accurate  65% with revisions; Neologism occasional, perseveration rare   Verbal Errors Semantic paraphasias;Neologisms;Perseveration;Aware of errors;Inconsistent;Jargon   Effective Techniques --  slowing rate of speech pt reports is helpful                         SLP Education - 02/12/15 1612    Education provided Yes   Education Details plan of care for ST   Person(s) Educated Patient   Methods Explanation   Comprehension Verbalized understanding          SLP Short Term Goals - 02/12/15 1629    SLP SHORT TERM GOAL #1   Title pt will complete simple naming tasks with 85% success, functionally   Time 4   Period Weeks   Status New   SLP SHORT TERM GOAL #2   Title pt will demo understanding of mod complex linguistic stimuli (directions with before/after, yes/no questions, etc) 75% with occasional min A   Time 4   Period Weeks   Status New   SLP SHORT TERM GOAL #3   Title pt will demo understanding of 3-4 sentence paragraphs (longer verbal stimuli) by answering yes/no  or "wh" questions with 80% success and occasional min cues   Time 4   Period Weeks   Status New   SLP SHORT TERM GOAL #4   Title pt will write name and address without signs of aphasia   Time 4   Period Weeks   Status New          SLP Long Term Goals - 02/12/15 1643    SLP LONG TERM GOAL #1   Title pt  will demo understanding of functional 10 minutes simple to mod complex conversation with request for repeats allowed   Time 8   Period Weeks   Status New   SLP LONG TERM GOAL #2   Title pt will participate in 10 minutes simple conversation with compensations with rare min questioning cues   Time 8   Period Weeks   Status New   SLP LONG TERM GOAL #3   Title pt will write simple functional lists with rare min A for aphasic errors   Time 8   Period Weeks   Status New          Plan - 03/13/2015 1613    Clinical Impression Statement Pt presents with receptive and expressive aphasia with possible cognitive-linguistic deficits (unable to fully assess today due to time constraints) due to mets to brain and subsequent craniotomy.   Speech Therapy Frequency 2x / week   Duration --  8 weeks   Treatment/Interventions Compensatory strategies;SLP instruction and feedback;Patient/family education;Functional tasks;Cueing hierarchy;Language facilitation   Potential to Achieve Goals Fair   Potential Considerations Co-morbidities   Consulted and Agree with Plan of Care Patient          G-Codes - 03-13-15 1650    Functional Assessment Tool Used clinical judgment   Functional Limitations Spoken language expressive   Spoken Language Expression Current Status [phone]) At least 40 percent but less than 60 percent impaired, limited or restricted   Spoken Language Expression Goal Status ([id number]) At least 20 percent but less than 40 percent impaired, limited or restricted      Problem List Patient Active Problem List   Diagnosis Date Noted  . Hyperbilirubinemia 01/17/2015  . DVT (deep  venous thrombosis) 01/17/2015  . Sensation of cold in leg 01/06/2015  . Pain of left leg 01/06/2015  . Metastasis to brain 12/04/2014  . Brain metastasis 11/17/2014  . Encounter for antineoplastic immunotherapy 11/17/2014  . Other pancytopenia   . Acute respiratory failure with hypoxia   . Antineoplastic chemotherapy induced pancytopenia   . SOB (shortness of breath) 07/21/2014  . CAP (community acquired pneumonia) 07/21/2014  . Neoplasm related pain 06/18/2014  . Anemia in neoplastic disease 06/04/2014  . Dyspnea 06/02/2014  . Acute right hip pain 05/26/2014  . Non-small cell carcinoma of lung, stage 4 01/30/2014  . Nodule of right lung 01/21/2014  . Mediastinal adenopathy 01/21/2014  . Prostate cancer   . History of radiation therapy   . 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 Sick sinus syndrome 04/16/2013  . Cardiac arrhythmia 07/12/2011  . COPD GOLD II 08/27/2010  . Abdominal aortic aneurysm 05/14/2008  . ACTINIC KERATOSIS, HEAD 02/11/2008  . LOSS, CONDUCTIVE HEARING, BILATERAL 01/18/2007  . HLD (hyperlipidemia) 12/25/2006  . Essential hypertension 12/25/2006  . Coronary atherosclerosis 12/25/2006    [facility] , [place], CCC-SLP   March 13, 2015, 4:51 PM  [facility] [address] [place], [place], [address] Phone: [phone]   Fax:  [phone]

## 2015-02-12 NOTE — Patient Instructions (Signed)
We will work on talking and understanding.  I will see you two times a week.

## 2015-02-12 NOTE — Therapy (Signed)
[facility] [address] [place], [place], [address] Phone: [phone]   Fax:  [phone]  Occupational Therapy Evaluation  Patient Details  Name: Nicholas Schmitt MRN: [id number] Date of Birth: 04/01/1936 Referring Provider:  Curt Bears, MD  Encounter Date: 02/12/2015      OT End of Session - 02/12/15 1514    Visit Number 1   Number of Visits 17   Date for OT Re-Evaluation 04/13/15   Authorization Type Blue MCR - G code   OT Start Time 1400   OT Stop Time 1445   OT Time Calculation (min) 45 min   Activity Tolerance Patient tolerated treatment well      Past Medical History  Diagnosis Date  . CAD (coronary artery disease)     positive stress test in 2006 led to left heart cath (8/06) showing 95% prox RCA, 90% CFX, and 90% mLAD.  patient had a cypher DES to all 3 lesions  . Hyperlipidemia   . Hearing loss   . AAA (abdominal aortic aneurysm) 11/09    3.2 cm   . Chronic renal insufficiency   . History of radiation therapy 03/08/04- 04/08/04    prostate 4600 cGy in 3 fractions, radioactive seed implant 05/04/04  . Myocardial infarction 1995  . Pacemaker 2014  . Dysrhythmia   . Shortness of breath     exertion  . Asthma     bronchial  . COPD (chronic obstructive pulmonary disease)   . Radiation Jan.11-Jan 29/2016    Right central chest 35 gray in 14 fractions  . Radiation Jan.2016    35 gray in 14 fx lower lumbar/upper sacrum  . Diverticulosis   . Hemorrhoids   . Pneumonia     hx  . GERD (gastroesophageal reflux disease)   . Hypertension     no med now for 3-4 months  . Cancer 2015    prostate   . Prostate cancer 2005    gleason 7  . Skin cancer     ear  . Lung cancer   . Cancer December 04, 2014    Cranial     Past Surgical History  Procedure Laterality Date  . Ptca  2006  . Coronary stent placement    . Permanent pacemaker insertion Bilateral 02/26/13    MDT Adapta L implanted by Dr  Rayann Heman for sick sinus syndrome  . Radioactive seed implant  05/04/2004    8000 cGy, Dr Danny Lawless Dr Reece Agar  . Elbow surgery Bilateral 1973  . Coronary angioplasty    . Mediastinoscopy N/A 01/28/2014    Procedure: MEDIASTINOSCOPY;  Surgeon: Melrose Nakayama, MD;  Location: [facility];  Service: Thoracic;  Laterality: N/A;  . Permanent pacemaker insertion N/A 02/26/2013    Procedure: PERMANENT PACEMAKER INSERTION;  Surgeon: [name], MD;  Location: [facility];  Service: Cardiovascular;  Laterality: N/A;  . Insert / replace / remove pacemaker      not removed  . Craniotomy N/A 12/04/2014    Procedure: CRANIOTOMY TUMOR EXCISION, LEFT;  Surgeon: [name], MD;  Location: [facility];  Service: Neurosurgery;  Laterality: N/A;  CRANIOTOMY TUMOR EXCISION  . Application of cranial navigation  12/04/2014    Procedure: APPLICATION OF CRANIAL NAVIGATION;  Surgeon: [name], MD;  Location: [facility];  Service: Neurosurgery;;    There were no vitals filed for this visit.  Visit Diagnosis:  Weakness of right arm - Plan: Ot plan of care cert/re-cert  Weakness of right hand - Plan: Ot plan of care cert/re-cert  Lack of coordination - Plan: Ot plan of care cert/re-cert  Decreased functional activity tolerance - Plan: Ot plan of care cert/re-cert      Subjective Assessment - 02/12/15 1406    Subjective  My Rt hand is weak   Patient is accompained by: Family member  fiancee   Patient Stated Goals get my dominant hand working good   Currently in Pain? Yes  O.T. not addressing d/t outside scope of practice. P.T. to address   Pain Score 5    Pain Location Foot   Pain Orientation Right           [facility] OT Assessment - 02/12/15 1410    Assessment   Diagnosis lung CA stage 4 with brain mets, s/p craniotomy   Onset Date --  brain mets in early June 2016, s/p craniotomy 12/04/14 (lung CA since July 2015)   Prior Therapy home health  therapies July 2016   Precautions    Precautions Fall  Blood clots RUE and RLE - on Lovenox, active CA with immunotherapy   Precaution Comments Pt reported no restrictions on DVT, fiancee reports MD not concerned with RUE clot b/c it's superficial    Balance Screen   Has the patient fallen in the past 6 months --  see P.T. eval   Home  Environment   Bathroom Shower/Tub Tub/Shower unit;Curtain   Shower/tub characteristics Engineering geologist seat;Cane - single point;Walker - 2 wheels   Additional Comments Lives in 1 story home with 3 STE. Celesta Gentile lives 2 doors down.    Lives With Alone   Prior Function   Level of Independence Independent with basic ADLs  and driving prior to June 2016   Vocation Retired   ADL   ADL comments Mod I for eating with occasional min spills and grooming with Rt hand, but needs assist to cut food. Mod I for dressing (difficulty with buttons), bathing and toileting. Pt performing snack prep, cold meals (cereal, sandwich). Pt can do some light cleaning but fiancee does most cleaning. Son often comes by to assist with yardwork, feeding the dog. Fiancee assist with financial management and writing checks   Mobility   Mobility Status --  uses cane as needed   Written Expression   Dominant Hand Right   Handwriting 77% legible  with name   Vision - History   Baseline Vision Bifocals   Vision Assessment   Comment pt reports decreased acuity, denies diplopia   Activity Tolerance   Activity Tolerance Tolerates 30 min activity with muliple rests   Cognition   Overall Cognitive Status --  mild expressive aphasia - see S.L.P eval for details   Sensation   Light Touch Appears Intact  but inconsistent for localization   Coordination   9 Hole Peg Test Right;Left   Right 9 Hole Peg Test 102.85 sec   Left 9 Hole Peg Test 36.93 sec   Edema   Edema mild Rt hand   ROM / Strength   AROM / PROM / Strength AROM;Strength   AROM   Overall AROM Comments BUE AROM WFL's except 75% Rt forearm  supination   Strength   Overall Strength Comments LUE: grossly 4+/5, RUE 4/5 at shoulders, 3+/5 for biceps/ triceps   Hand Function   Right Hand Grip (lbs) 30 lbs   Left Hand Grip (lbs) 62 lbs  OT Short Term Goals - Feb 15, 2015 1518    OT SHORT TERM GOAL #1   Title Independent with coordination and putty HEP for Rt hand (due 03/14/15)   Time 4   Period Weeks   Status New   OT SHORT TERM GOAL #2   Title Improve grip strength by 10 lbs or greater Rt hand    Baseline Rt = 30 (Lt = 62 lbs)   Time 4   Period Weeks   Status New   OT SHORT TERM GOAL #3   Title Improve coordination as evidenced by performing 9 hole peg test in 90 sec or less   Baseline 102.85 sec.    Time 4   Period Weeks   Status New   OT SHORT TERM GOAL #4   Title Pt to write name at 90% or greater legibility   Baseline 75%   Time 4   Period Weeks   Status New           OT Long Term Goals - 2015-02-15 1522    OT LONG TERM GOAL #1   Title Independent w/ strengthening HEP for RUE - due 04/13/15   Time 8   Period Weeks   Status New   OT LONG TERM GOAL #2   Title Improve grip strength to 50 lbs or greater Rt hand   Baseline eval = 30 lbs   Time 8   Period Weeks   Status New   OT LONG TERM GOAL #3   Title Improve coordination as evidenced by performing 9 hole peg test in 75 sec. or less   Baseline eval = 102.85 sec.    Time 8   Period Weeks   Status New   OT LONG TERM GOAL #4   Title Pt to write check at 90% legibility or greater   Baseline eval = 75% name only   Time 8   Period Weeks   Status New   OT LONG TERM GOAL #5   Title Pt to prepare simple meal (stovetop) with distant supervision safely   Time 8   Period Weeks   Status New               Plan - 15-Feb-2015 1515    Clinical Impression Statement Pt is a 79 y.o. male who presents to outpatient rehab with RUE weakness and decreased coordination following brain mets and craniotomy on 12/04/14  from stage 4 lung CA.    Pt will benefit from skilled therapeutic intervention in order to improve on the following deficits (Retired) Decreased coordination;Decreased range of motion;Decreased endurance;Impaired sensation;Decreased activity tolerance;Decreased knowledge of precautions;Decreased knowledge of use of DME;Impaired UE functional use;Decreased cognition;Decreased mobility;Decreased strength;Impaired vision/preception   Rehab Potential Good   OT Frequency 2x / week   OT Duration 8 weeks  plus evaluation   OT Treatment/Interventions Self-care/ADL training;Therapeutic exercise;Functional Mobility Training;Patient/family education;Neuromuscular education;Splinting;Manual Therapy;Energy conservation;DME and/or AE instruction;Therapeutic activities;Cognitive remediation/compensation;Passive range of motion;Visual/perceptual remediation/compensation   Plan coordination and putty HEP for Rt hand   Consulted and Agree with Plan of Care Patient;Family member/caregiver   Family Member Consulted fiancee          G-Codes - 2015-02-15 1555    Functional Assessment Tool Used 9 hole peg test Rt = 102.85 sec., Grip strength Rt = 30 lbs   Functional Limitation Carrying, moving and handling objects   Carrying, Moving and Handling Objects Current Status ([id number]) At least 40 percent but less than 60 percent impaired, limited or restricted  Carrying, Moving and Handling Objects Goal Status [phone]) At least 1 percent but less than 20 percent impaired, limited or restricted      Problem List Patient Active Problem List   Diagnosis Date Noted  . Hyperbilirubinemia 01/17/2015  . DVT (deep venous thrombosis) 01/17/2015  . Sensation of cold in leg 01/06/2015  . Pain of left leg 01/06/2015  . Metastasis to brain 12/04/2014  . Brain metastasis 11/17/2014  . Encounter for antineoplastic immunotherapy 11/17/2014  . Other pancytopenia   . Acute respiratory failure with hypoxia   . Antineoplastic  chemotherapy induced pancytopenia   . SOB (shortness of breath) 07/21/2014  . CAP (community acquired pneumonia) 07/21/2014  . Neoplasm related pain 06/18/2014  . Anemia in neoplastic disease 06/04/2014  . Dyspnea 06/02/2014  . Acute right hip pain 05/26/2014  . Non-small cell carcinoma of lung, stage 4 01/30/2014  . Nodule of right lung 01/21/2014  . Mediastinal adenopathy 01/21/2014  . Prostate cancer   . History of radiation therapy   . 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 Sick sinus syndrome 04/16/2013  . Cardiac arrhythmia 07/12/2011  . COPD GOLD II 08/27/2010  . Abdominal aortic aneurysm 05/14/2008  . ACTINIC KERATOSIS, HEAD 02/11/2008  . LOSS, CONDUCTIVE HEARING, BILATERAL 01/18/2007  . HLD (hyperlipidemia) 12/25/2006  . Essential hypertension 12/25/2006  . Coronary atherosclerosis 12/25/2006    Carey Bullocks, OTR/L 02/12/2015, 3:59 PM  [facility] [address] [place], [place], [address] Phone: [phone]   Fax:  [phone]

## 2015-02-13 NOTE — Therapy (Signed)
[facility] [address] [place], [place], [address] Phone: [phone]   Fax:  [phone]  Physical Therapy Evaluation  Patient Details  Name: Nicholas Schmitt MRN: [id number] Date of Birth: 1936-01-18 Referring Provider:  [name], MD  Encounter Date: 02/12/2015      PT End of Session - 02/12/15 1412    Visit Number 1   Number of Visits 17   Date for PT Re-Evaluation 04/14/15   Authorization Type BCBS Medicare-G-code required every 10th visit   PT Start Time 1318   PT Stop Time 1400   PT Time Calculation (min) 42 min   Equipment Utilized During Treatment Gait belt   Activity Tolerance Patient tolerated treatment well   Behavior During Therapy [facility] for tasks assessed/performed      Past Medical History  Diagnosis Date  . CAD (coronary artery disease)     positive stress test in 2006 led to left heart cath (8/06) showing 95% prox RCA, 90% CFX, and 90% mLAD.  patient had a cypher DES to all 3 lesions  . Hyperlipidemia   . Hearing loss   . AAA (abdominal aortic aneurysm) 11/09    3.2 cm   . Chronic renal insufficiency   . History of radiation therapy 03/08/04- 04/08/04    prostate 4600 cGy in 3 fractions, radioactive seed implant 05/04/04  . Myocardial infarction 1995  . Pacemaker 2014  . Dysrhythmia   . Shortness of breath     exertion  . Asthma     bronchial  . COPD (chronic obstructive pulmonary disease)   . Radiation Jan.11-Jan 29/2016    Right central chest 35 gray in 14 fractions  . Radiation Jan.2016    35 gray in 14 fx lower lumbar/upper sacrum  . Diverticulosis   . Hemorrhoids   . Pneumonia     hx  . GERD (gastroesophageal reflux disease)   . Hypertension     no med now for 3-4 months  . Cancer 2015    prostate   . Prostate cancer 2005    gleason 7  . Skin cancer     ear  . Lung cancer   . Cancer December 04, 2014    Cranial     Past Surgical History  Procedure Laterality Date   . Ptca  2006  . Coronary stent placement    . Permanent pacemaker insertion Bilateral 02/26/13    MDT Adapta L implanted by Dr Rayann Heman for sick sinus syndrome  . Radioactive seed implant  05/04/2004    8000 cGy, Dr Danny Lawless Dr Reece Agar  . Elbow surgery Bilateral 1973  . Coronary angioplasty    . Mediastinoscopy N/A 01/28/2014    Procedure: MEDIASTINOSCOPY;  Surgeon: Melrose Nakayama, MD;  Location: [facility];  Service: Thoracic;  Laterality: N/A;  . Permanent pacemaker insertion N/A 02/26/2013    Procedure: PERMANENT PACEMAKER INSERTION;  Surgeon: [name], MD;  Location: [facility];  Service: Cardiovascular;  Laterality: N/A;  . Insert / replace / remove pacemaker      not removed  . Craniotomy N/A 12/04/2014    Procedure: CRANIOTOMY TUMOR EXCISION, LEFT;  Surgeon: [name], MD;  Location: [facility];  Service: Neurosurgery;  Laterality: N/A;  CRANIOTOMY TUMOR EXCISION  . Application of cranial navigation  12/04/2014    Procedure: APPLICATION OF CRANIAL NAVIGATION;  Surgeon: [name], MD;  Location: [facility];  Service: Neurosurgery;;    There were no vitals filed for  this visit.  Visit Diagnosis:  Abnormality of gait  Muscle weakness of lower extremity  Unsteady gait      Subjective Assessment - 02/12/15 1324    Subjective Pt is a 79 year old male who pesents to OP PT status post brain tumor resection/craniotomy on 12/04/14.  Pt notes speech difficulties and arm swelling, with changes noted in balance since surgery.  Pt has not had any falls.  He has a walker and a cane.     Patient is accompained by: Family member  girlfriend, June   Patient Stated Goals Pt wants to get help with talking and with R arm.  Pt in agreement with balance assessment and walking.   Currently in Pain? Yes  PT will monitor pain, but will not address as a goal at this time.     Pain Score 5    Pain Location Foot   Pain Orientation Right   Pain Descriptors / Indicators Aching   Pain  Type Acute pain   Pain Onset 1 to 4 weeks ago   Pain Frequency Constant   Aggravating Factors  Unsure what aggravates   Pain Relieving Factors Oxycodone alleviates   Effect of Pain on Daily Activities difficulty sleeping            [facility] PT Assessment - 02/12/15 1333    Assessment   Medical Diagnosis Stage IV lung cancer with brain mets; brain tumor resection with craniotomy 12/04/14   Onset Date/Surgical Date 12/04/14   Precautions   Precautions Fall  Blood clots RUE and RLE-on Lovenox   Precaution Comments Pt reported no restrictions on DVT   Balance Screen   Has the patient fallen in the past 6 months No   Has the patient had a decrease in activity level because of a fear of falling?  No   Is the patient reluctant to leave their home because of a fear of falling?  No   Home Environment   Living Environment Private residence   Living Arrangements Alone   Available Help at Discharge Family;Friend(s)  girlfriend and son help   Type of [place] to enter   Entrance Stairs-Number of Steps 3   Entrance Stairs-Rails Can reach both;Left;Right   Home Layout One level   [place] - single point;Walker - 2 wheels;Shower seat   Prior Function   Level of Independence Independent with basic ADLs;Independent with household mobility without device  prior to surgery   Observation/Other Assessments   Focus on Therapeutic Outcomes (FOTO)  Pt not in FOTO system   ROM / Strength   AROM / PROM / Strength Strength   Strength   Strength Assessment Site Hip;Knee;Ankle   Right/Left Hip Right;Left   Right Hip Flexion 3+/5   Left Hip Flexion 3+/5   Right/Left Knee Right;Left   Right Knee Flexion 3+/5   Right Knee Extension 4/5   Left Knee Flexion 3+/5   Left Knee Extension 4/5   Right/Left Ankle Right;Left   Right Ankle Dorsiflexion 3/5   Left Ankle Dorsiflexion 3+/5   Transfers   Transfers Sit to Stand;Stand to Sit   Sit to Stand 6: Modified independent  (Device/Increase time);With upper extremity assist;From chair/3-in-1   Stand to Sit 6: Modified independent (Device/Increase time);With upper extremity assist;To chair/3-in-1   Ambulation/Gait   Ambulation/Gait Yes   Ambulation/Gait Assistance 4: Min guard   Ambulation Distance (Feet) 170 Feet   Assistive device None   Gait Pattern Step-through pattern;Decreased  arm swing - right;Decreased arm swing - left;Decreased weight shift to right;Lateral trunk lean to right;Trendelenburg;Wide base of support  Reaches out for PT assistance, for furniture   Ambulation Surface Level;Indoor   Gait velocity 13.84 sec=2.37 ft/sec   Standardized Balance Assessment   Standardized Balance Assessment Timed Up and Go Test;Berg Balance Test   Berg Balance Test   Sit to Stand Able to stand  independently using hands   Standing Unsupported Able to stand 2 minutes with supervision   Sitting with Back Unsupported but Feet Supported on Floor or Stool Able to sit safely and securely 2 minutes   Stand to Sit Controls descent by using hands   Transfers Able to transfer safely, definite need of hands   Standing Unsupported with Eyes Closed Able to stand 10 seconds with supervision   Standing Ubsupported with Feet Together Able to place feet together independently and stand for 1 minute with supervision   From Standing, Reach Forward with Outstretched Arm Can reach forward >5 cm safely (2")   From Standing Position, Pick up Object from [place] to pick up shoe, needs supervision   From Standing Position, Turn to Look Behind Over each Shoulder Needs supervision when turning   Turn 360 Degrees Needs close supervision or verbal cueing   Standing Unsupported, Alternately Place Feet on Step/Stool Able to complete >2 steps/needs minimal assist   Standing Unsupported, One Foot in Front Able to take small step independently and hold 30 seconds   Standing on One Leg Unable to try or needs assist to prevent fall   Total Score  32   Timed Up and Go Test   Normal TUG (seconds) 13.6                             PT Short Term Goals - 02/12/15 1417    PT SHORT TERM GOAL #1   Title Pt will perform HEP with family supervision for improved strength, balance, mobility.  Target 03/14/15   Time 4   Period Weeks   Status New   PT SHORT TERM GOAL #2   Title Pt will improve Berg Balance score to at least 37/56 for decreased fall risk.   Baseline 32/56 at eval   Time 4   Period Weeks   Status New   PT SHORT TERM GOAL #3   Title Pt will improve TUG score to less than or equal to 13.5 seconds for decreased fall risk.   Baseline 13.6 sec at eval   Time 4   Period Weeks   Status New   PT SHORT TERM GOAL #4   Title Pt will ambulate at least 200 ft. using least restrictive assistive device (cane vs. RW) modified independently, for improved functional mobility.   Time 4   Period Weeks   Status New           PT Long Term Goals - 02/12/15 1419    PT LONG TERM GOAL #1   Title Pt/family will verbalize understanding of fall prevention within home environment.  TARGET 04/14/15   Time 8   Period Weeks   Status New   PT LONG TERM GOAL #2   Title Pt will improve Berg Balance score to at least 42/56 for decreased fall risk.   Time 8   Period Weeks   Status New   PT LONG TERM GOAL #3   Title Pt will improve gait velocity to at least 2.62  ft/sec for improved gait efficiency and safety.   Time 8   Period Weeks   Status New   PT LONG TERM GOAL #4   Title Pt will ambulate at least 500 ft, indoor and outdoor surfaces, modified independently, for improved functional mobility.   Time 8   Period Weeks   Status New   PT LONG TERM GOAL #5   Title Pt will verbalize plans for continued community fitness upon D/C from PT.   Time 8   Status New               Plan - 05-Mar-2015 1413    Clinical Impression Statement Pt is a 79 year old male who presents to OPPT with stage IV lung cancer, brain  metastases, s/p brain tumor resection with a craniotomy on 12/04/14.  Since pt's surgery, he notes decreased RUE and RLE strength as well as changes in balance and speech.  He has had no falls.  Prior to surgery, pt was independent with gait and ADLs.  Pt presents to OP PT with decreased lower extremity strength, decreased balance, unsteady/abnormal gait pattern.  He would benefit from skilled PT to address the above stated deficits.     Pt will benefit from skilled therapeutic intervention in order to improve on the following deficits Abnormal gait;Decreased activity tolerance;Decreased balance;Decreased mobility;Decreased safety awareness;Decreased strength;Difficulty walking   Rehab Potential Good   PT Frequency 2x / week   PT Duration 8 weeks   PT Treatment/Interventions ADLs/Self Care Home Management;Therapeutic exercise;Therapeutic activities;Functional mobility training;Gait training;Patient/family education;DME Instruction;Neuromuscular re-education;Balance training   PT Next Visit Plan Initiate HEP for balance, walking program for home   Consulted and Agree with Plan of Care Patient;Family member/caregiver   Family Member Consulted girlfriend-June          G-Codes - 03/05/15 2992    Functional Assessment Tool Used Merrilee Jansky 32/56, gait velocity 2.37 ft/sec, TUG 13.60 seconds   Functional Limitation Mobility: Walking and moving around   Mobility: Walking and Moving Around Current Status [phone]) At least 40 percent but less than 60 percent impaired, limited or restricted   Mobility: Walking and Moving Around Goal Status [phone]) At least 20 percent but less than 40 percent impaired, limited or restricted       Problem List Patient Active Problem List   Diagnosis Date Noted  . Hyperbilirubinemia 01/17/2015  . DVT (deep venous thrombosis) 01/17/2015  . Sensation of cold in leg 01/06/2015  . Pain of left leg 01/06/2015  . Metastasis to brain 12/04/2014  . Brain metastasis 11/17/2014  .  Encounter for antineoplastic immunotherapy 11/17/2014  . Other pancytopenia   . Acute respiratory failure with hypoxia   . Antineoplastic chemotherapy induced pancytopenia   . SOB (shortness of breath) 07/21/2014  . CAP (community acquired pneumonia) 07/21/2014  . Neoplasm related pain 06/18/2014  . Anemia in neoplastic disease 06/04/2014  . Dyspnea 06/02/2014  . Acute right hip pain 05/26/2014  . Non-small cell carcinoma of lung, stage 4 01/30/2014  . Nodule of right lung 01/21/2014  . Mediastinal adenopathy 01/21/2014  . Prostate cancer   . History of radiation therapy   . 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 Sick sinus syndrome 04/16/2013  . Cardiac arrhythmia 07/12/2011  . COPD GOLD II 08/27/2010  . Abdominal aortic aneurysm 05/14/2008  . ACTINIC KERATOSIS, HEAD 02/11/2008  . LOSS, CONDUCTIVE HEARING, BILATERAL 01/18/2007  . HLD (hyperlipidemia) 12/25/2006  . Essential hypertension 12/25/2006  . Coronary atherosclerosis 12/25/2006  MARRIOTT,AMY W. 02/13/2015, 9:17 AM [facility] [address] [place], [place], [address] Phone: [phone]   Fax:  [phone]

## 2015-02-17 ENCOUNTER — Telehealth: Payer: Self-pay | Admitting: Internal Medicine

## 2015-02-17 ENCOUNTER — Encounter: Payer: Self-pay | Admitting: Internal Medicine

## 2015-02-17 ENCOUNTER — Ambulatory Visit (HOSPITAL_BASED_OUTPATIENT_CLINIC_OR_DEPARTMENT_OTHER): Payer: Medicare Other

## 2015-02-17 ENCOUNTER — Other Ambulatory Visit (HOSPITAL_BASED_OUTPATIENT_CLINIC_OR_DEPARTMENT_OTHER): Payer: Medicare Other

## 2015-02-17 ENCOUNTER — Telehealth: Payer: Self-pay | Admitting: *Deleted

## 2015-02-17 ENCOUNTER — Ambulatory Visit (HOSPITAL_BASED_OUTPATIENT_CLINIC_OR_DEPARTMENT_OTHER): Payer: Medicare Other | Admitting: Internal Medicine

## 2015-02-17 VITALS — BP 93/54 | HR 106 | Temp 98.7°F | Resp 18 | Ht 72.0 in | Wt 169.9 lb

## 2015-02-17 DIAGNOSIS — C3411 Malignant neoplasm of upper lobe, right bronchus or lung: Secondary | ICD-10-CM

## 2015-02-17 DIAGNOSIS — Z79899 Other long term (current) drug therapy: Secondary | ICD-10-CM | POA: Diagnosis not present

## 2015-02-17 DIAGNOSIS — Z5112 Encounter for antineoplastic immunotherapy: Secondary | ICD-10-CM

## 2015-02-17 DIAGNOSIS — C7931 Secondary malignant neoplasm of brain: Secondary | ICD-10-CM

## 2015-02-17 DIAGNOSIS — C3490 Malignant neoplasm of unspecified part of unspecified bronchus or lung: Secondary | ICD-10-CM

## 2015-02-17 DIAGNOSIS — G893 Neoplasm related pain (acute) (chronic): Secondary | ICD-10-CM | POA: Diagnosis not present

## 2015-02-17 DIAGNOSIS — D630 Anemia in neoplastic disease: Secondary | ICD-10-CM

## 2015-02-17 DIAGNOSIS — Z86718 Personal history of other venous thrombosis and embolism: Secondary | ICD-10-CM

## 2015-02-17 DIAGNOSIS — C3491 Malignant neoplasm of unspecified part of right bronchus or lung: Secondary | ICD-10-CM

## 2015-02-17 DIAGNOSIS — Z86711 Personal history of pulmonary embolism: Secondary | ICD-10-CM

## 2015-02-17 LAB — COMPREHENSIVE METABOLIC PANEL (CC13)
ALBUMIN: 2.8 g/dL — AB (ref 3.5–5.0)
ALK PHOS: 68 U/L (ref 40–150)
ALT: 12 U/L (ref 0–55)
AST: 10 U/L (ref 5–34)
Anion Gap: 9 mEq/L (ref 3–11)
BILIRUBIN TOTAL: 0.69 mg/dL (ref 0.20–1.20)
BUN: 8.6 mg/dL (ref 7.0–26.0)
CO2: 28 meq/L (ref 22–29)
Calcium: 9.7 mg/dL (ref 8.4–10.4)
Chloride: 103 mEq/L (ref 98–109)
Creatinine: 0.8 mg/dL (ref 0.7–1.3)
EGFR: 88 mL/min/{1.73_m2} — ABNORMAL LOW (ref >90)
Glucose: 110 mg/dl (ref 70–140)
Potassium: 4 mEq/L (ref 3.5–5.1)
Sodium: 140 mEq/L (ref 136–145)
Total Protein: 6.4 g/dL (ref 6.4–8.3)

## 2015-02-17 LAB — CBC WITH DIFFERENTIAL/PLATELET
BASO%: 0.2 % (ref 0.0–2.0)
Basophils Absolute: 0 10*3/uL (ref 0.0–0.1)
EOS%: 1.3 % (ref 0.0–7.0)
Eosinophils Absolute: 0.1 10*3/uL (ref 0.0–0.5)
HEMATOCRIT: 30.1 % — AB (ref 38.4–49.9)
HEMOGLOBIN: 9.1 g/dL — AB (ref 13.0–17.1)
LYMPH%: 15.7 % (ref 14.0–49.0)
MCH: 28.7 pg (ref 27.2–33.4)
MCHC: 30.2 g/dL — ABNORMAL LOW (ref 32.0–36.0)
MCV: 95 fL (ref 79.3–98.0)
MONO#: 0.5 10*3/uL (ref 0.1–0.9)
MONO%: 10.3 % (ref 0.0–14.0)
NEUT%: 72.5 % (ref 39.0–75.0)
NEUTROS ABS: 3.2 10*3/uL (ref 1.5–6.5)
Platelets: 187 10*3/uL (ref 140–400)
RBC: 3.17 10*6/uL — ABNORMAL LOW (ref 4.20–5.82)
RDW: 16 % — AB (ref 11.0–14.6)
WBC: 4.5 10*3/uL (ref 4.0–10.3)
lymph#: 0.7 10*3/uL — ABNORMAL LOW (ref 0.9–3.3)

## 2015-02-17 LAB — TSH CHCC: TSH: 2.033 m[IU]/L (ref 0.320–4.118)

## 2015-02-17 MED ORDER — PROCHLORPERAZINE MALEATE 10 MG PO TABS
10.0000 mg | ORAL_TABLET | Freq: Once | ORAL | Status: AC
  Administered 2015-02-17: 10 mg via ORAL

## 2015-02-17 MED ORDER — SODIUM CHLORIDE 0.9 % IV SOLN
Freq: Once | INTRAVENOUS | Status: AC
  Administered 2015-02-17: 10:00:00 via INTRAVENOUS

## 2015-02-17 MED ORDER — OXYCODONE HCL 5 MG PO TABS: 5.0000 mg | ORAL_TABLET | Freq: Four times a day (QID) | ORAL | Status: DC | PRN

## 2015-02-17 MED ORDER — PROCHLORPERAZINE MALEATE 10 MG PO TABS
ORAL_TABLET | ORAL | Status: AC
  Filled 2015-02-17: qty 1

## 2015-02-17 MED ORDER — PEMBROLIZUMAB CHEMO INJECTION 100 MG/4ML
2.0000 mg/kg | Freq: Once | INTRAVENOUS | Status: AC
  Administered 2015-02-17: 150 mg via INTRAVENOUS
  Filled 2015-02-17: qty 6

## 2015-02-17 MED ORDER — MIRTAZAPINE 30 MG PO TABS: 30.0000 mg | ORAL_TABLET | Freq: Every day | ORAL | Status: DC

## 2015-02-17 NOTE — Telephone Encounter (Signed)
per pof to sh pt appt-sent MW email to sch pt trmt-pt aware of appt-gave copy of avs

## 2015-02-17 NOTE — Telephone Encounter (Signed)
Per staff message and POF I have scheduled appts. Advised scheduler of appts. JMW  

## 2015-02-17 NOTE — Telephone Encounter (Signed)
CALLED PATIENT TO INFORM OF PT APPT. FOR 02-18-15 @ 1 PM, PATIENT IS LOOKING FOR SOMEONE TO TAKE, HE IS TO LET ME KNOW.

## 2015-02-17 NOTE — Progress Notes (Signed)
[place] [phone]) [phone]   [phone]) [phone]  OFFICE PROGRESS NOTE  [name],[name] Resides, MD [place] [address]  DIAGNOSIS: Non-small cell carcinoma of lung, stage 4  Primary site: Lung (Right)  Staging method: AJCC 7th Edition  Clinical: Stage IV (T1a, N3, M1b) signed by Curt Bears, MD on 02/01/2014 1:42 PM  Summary: Stage IV (T1a, N3, M1b) Prostate cancer  Primary site: Prostate  Clinical: Stage I (T1c, NX, M0) signed by Wyatt Portela, MD on 08/06/2013 1:59 PM  Summary: Stage I (T1c, NX, M0)  PRIOR THERAPY:  1) Systemic chemotherapy with carboplatin for an AUC of 5 and Alimta 500 mg per meter squared given every 3 weeks. Status post 6 cycles, last dose was given 05/20/2014 discontinued secondary to disease progression. 2) palliative radiotherapy to the right lung and lumbar spines under the care of Dr. Sondra Come. 3) status post craniotomy with tumor excision under the care of Dr. Vertell Limber on 12/04/2014.  CURRENT THERAPY: Immunotherapy with Ketruda (pembrolizumab) 2 MG/KG every 3 weeks. First dose 07/29/2014. He has positive PDL 1 expression (90%). Status post 6 cycles and his treatment is currently on hold secondary to the recent metastatic brain lesion and surgery. He will resume cycle #8 today.  INTERVAL HISTORY: Nicholas Schmitt 79 y.o. male returns to the clinic today for follow-up visit accompanied by his wife. He is feeling fine today with no specific complaints except for mild fatigue. He also feels cold all the time secondary to the anemia. He is currently on oral iron tablet once daily. He continues to have occasional episodes with speech trouble as well as numbness in his fingers. He is currently on Lovenox for deep venous thrombosis and pulmonary embolism. He denied having any significant fever or chills, no nausea or vomiting. The patient denied having any significant chest pain, shortness of breath except with  exertion, cough or hemoptysis. He is here today to start cycle #9 of his immunotherapy.  MEDICAL HISTORY: Past Medical History  Diagnosis Date  . CAD (coronary artery disease)     positive stress test in 2006 led to left heart cath (8/06) showing 95% prox RCA, 90% CFX, and 90% mLAD.  patient had a cypher DES to all 3 lesions  . Hyperlipidemia   . Hearing loss   . AAA (abdominal aortic aneurysm) 11/09    3.2 cm   . Chronic renal insufficiency   . History of radiation therapy 03/08/04- 04/08/04    prostate 4600 cGy in 3 fractions, radioactive seed implant 05/04/04  . Myocardial infarction 1995  . Pacemaker 2014  . Dysrhythmia   . Shortness of breath     exertion  . Asthma     bronchial  . COPD (chronic obstructive pulmonary disease)   . Radiation Jan.11-Jan 29/2016    Right central chest 35 gray in 14 fractions  . Radiation Jan.2016    35 gray in 14 fx lower lumbar/upper sacrum  . Diverticulosis   . Hemorrhoids   . Pneumonia     hx  . GERD (gastroesophageal reflux disease)   . Hypertension     no med now for 3-4 months  . Cancer 2015    prostate   . Prostate cancer 2005    gleason 7  . Skin cancer     ear  . Lung cancer   . Cancer December 04, 2014    Cranial     ALLERGIES:  is allergic to oxycodone  hcl.  MEDICATIONS:  Current Outpatient Prescriptions  Medication Sig Dispense Refill  . acetaminophen (TYLENOL) 500 MG tablet Take 1,000 mg by mouth every 6 (six) hours as needed (pain).     [name] albuterol (PROVENTIL,VENTOLIN) 90 MCG/ACT inhaler Inhale 2 puffs into the lungs every 6 (six) hours as needed for wheezing or shortness of breath.    . ALPRAZolam (XANAX) 0.25 MG tablet Take 1 tablet (0.25 mg total) by mouth at bedtime as needed for anxiety. 30 tablet 0  . budesonide-formoterol (SYMBICORT) 160-4.5 MCG/ACT inhaler Inhale 2 puffs into the lungs 2 (two) times daily. (Patient taking differently: Inhale 1 puff into the lungs 2 (two) times daily. )    . dexamethasone  (DECADRON) 2 MG tablet Take 1 tablet (2 mg total) by mouth 2 (two) times daily. 30 tablet 0  . enoxaparin (LOVENOX) 120 MG/0.8ML injection Inject 0.8 mLs (120 mg total) into the skin daily. 30 Syringe 1  . famotidine (PEPCID) 20 MG tablet Take 20 mg by mouth at bedtime.    . furosemide (LASIX) 20 MG tablet TAKE 1 TABLET (20 MG TOTAL) BY MOUTH DAILY. AS NEEDED FOR SWELLING. 30 tablet 0  . hydrocortisone (ANUSOL-HC) 2.5 % rectal cream Place 1 application rectally 2 (two) times daily. 30 g 3  . lactulose (CHRONULAC) 10 GM/15ML solution Take 15 mLs (10 g total) by mouth 2 (two) times daily as needed for mild constipation. 473 mL 1  . mirtazapine (REMERON) 30 MG tablet Take 1 tablet (30 mg total) by mouth at bedtime. 30 tablet 2  . Multiple Minerals (CALCIUM-MAGNESIUM-ZINC) TABS Take 1 tablet by mouth daily.    . Multiple Vitamin (MULTIVITAMIN WITH MINERALS) TABS tablet Take 1 tablet by mouth daily.    . mupirocin ointment (BACTROBAN) 2 % Apply 1 application topically 2 (two) times daily. 22 g 0  . nitroGLYCERIN (NITROSTAT) 0.4 MG SL tablet Place 0.4 mg under the tongue every 5 (five) minutes as needed for chest pain (MAX 3 TABLETS).    [name] omeprazole (PRILOSEC) 40 MG capsule Take 40 mg by mouth daily before supper.   0  . prochlorperazine (COMPAZINE) 10 MG tablet Take 10 mg by mouth every 6 (six) hours as needed for nausea or vomiting.    . Sennosides (EX-LAX) 15 MG CHEW Chew 15 mg by mouth at bedtime.    . tamsulosin (FLOMAX) 0.4 MG CAPS capsule Take 0.4 mg by mouth at bedtime.   2   No current facility-administered medications for this visit.   Facility-Administered Medications Ordered in Other Visits  Medication Dose Route Frequency Provider Last Rate Last Dose  . heparin lock flush 100 unit/mL  500 Units Intracatheter Daily PRN Adrena E Johnson, PA-C      . sodium chloride 0.9 % injection 10 mL  10 mL Intracatheter PRN Adrena E Johnson, PA-C      . sodium chloride 0.9 % injection 3 mL  3 mL  Intracatheter PRN Carlton Adam, PA-C        SURGICAL HISTORY:  Past Surgical History  Procedure Laterality Date  . Ptca  2006  . Coronary stent placement    . Permanent pacemaker insertion Bilateral 02/26/13    MDT Adapta L implanted by Dr Rayann Heman for sick sinus syndrome  . Radioactive seed implant  05/04/2004    8000 cGy, Dr Danny Lawless Dr Reece Agar  . Elbow surgery Bilateral 1973  . Coronary angioplasty    . Mediastinoscopy N/A 01/28/2014    Procedure: MEDIASTINOSCOPY;  Surgeon: Revonda Standard  Roxan Hockey, MD;  Location: [facility];  Service: Thoracic;  Laterality: N/A;  . Permanent pacemaker insertion N/A 02/26/2013    Procedure: PERMANENT PACEMAKER INSERTION;  Surgeon: [name], MD;  Location: [facility];  Service: Cardiovascular;  Laterality: N/A;  . Insert / replace / remove pacemaker      not removed  . Craniotomy N/A 12/04/2014    Procedure: CRANIOTOMY TUMOR EXCISION, LEFT;  Surgeon: [name], MD;  Location: [facility];  Service: Neurosurgery;  Laterality: N/A;  CRANIOTOMY TUMOR EXCISION  . Application of cranial navigation  12/04/2014    Procedure: APPLICATION OF CRANIAL NAVIGATION;  Surgeon: [name], MD;  Location: [facility];  Service: Neurosurgery;;    REVIEW OF SYSTEMS:  Constitutional: positive for fatigue Eyes: negative Ears, nose, mouth, throat, and face: negative Respiratory: positive for dyspnea on exertion Cardiovascular: negative Gastrointestinal: negative Genitourinary:negative Integument/breast: negative Hematologic/lymphatic: negative Musculoskeletal:negative Neurological: positive for paresthesia and speech problems Behavioral/Psych: negative Endocrine: negative Allergic/Immunologic: negative   PHYSICAL EXAMINATION: General appearance: alert, cooperative, fatigued and no distress Head: Normocephalic, without obvious abnormality, atraumatic Neck: no adenopathy, no carotid bruit, supple, symmetrical, trachea midline and thyroid not enlarged,  symmetric, no tenderness/mass/nodules Lymph nodes: Cervical, supraclavicular, and axillary nodes normal. Resp: clear to auscultation bilaterally Back: symmetric, no curvature. ROM normal. No CVA tenderness. Cardio: regular rate and rhythm, S1, S2 normal, no murmur, click, rub or gallop GI: soft, non-tender; bowel sounds normal; no masses,  no organomegaly Extremities: extremities normal, atraumatic, no cyanosis or edema Neurologic: Alert and oriented X 3, normal strength and tone. Normal symmetric reflexes. Normal coordination and gait  ECOG PERFORMANCE STATUS: 2 - Symptomatic, <50% confined to bed  Blood pressure 93/54, pulse 106, temperature 98.7 F (37.1 C), temperature source Oral, resp. rate 18, height 6' (1.829 m), weight 169 lb 14.4 oz (77.066 kg), SpO2 98 %.  LABORATORY DATA: Lab Results  Component Value Date   WBC 4.5 02/17/2015   HGB 9.1* 02/17/2015   HCT 30.1* 02/17/2015   MCV 95.0 02/17/2015   PLT 187 02/17/2015      Chemistry      Component Value Date/Time   NA 140 02/17/2015 0903   NA 135 11/28/2014 1353   K 4.0 02/17/2015 0903   K 4.8 11/28/2014 1353   CL 101 11/28/2014 1353   CO2 28 02/17/2015 0903   CO2 25 11/28/2014 1353   BUN 8.6 02/17/2015 0903   BUN 26* 11/28/2014 1353   CREATININE 0.8 02/17/2015 0903   CREATININE 0.73 11/28/2014 1353      Component Value Date/Time   CALCIUM 9.7 02/17/2015 0903   CALCIUM 9.6 11/28/2014 1353   ALKPHOS 68 02/17/2015 0903   ALKPHOS 66 07/21/2014 1821   AST 10 02/17/2015 0903   AST 43* 07/21/2014 1821   ALT 12 02/17/2015 0903   ALT 37 07/21/2014 1821   BILITOT 0.69 02/17/2015 0903   BILITOT 1.2 07/21/2014 1821       RADIOGRAPHIC STUDIES: Ct Chest W Contrast  01/22/2015   CLINICAL DATA:  Lung cancer diagnosed 8/15 with metastasis. Prostate cancer in 2014. Ongoing chemotherapy. Ex-smoker; quit in 1988.  EXAM: CT CHEST, ABDOMEN, AND PELVIS WITH CONTRAST  TECHNIQUE: Multidetector CT imaging of the chest, abdomen  and pelvis was performed following the standard protocol during bolus administration of intravenous contrast.  CONTRAST:  189m OMNIPAQUE IOHEXOL 300 MG/ML  SOLN  COMPARISON:  11/25/2014  FINDINGS: CT CHEST FINDINGS  Mediastinum/Nodes: No supraclavicular adenopathy. Aortic and branch vessel atherosclerosis. Normal heart  size, without pericardial effusion. Filling defects within left lower lobe pulmonary artery branches, including on image 32 of series 3. Nonocclusive.  Central venous insufficiency, at least partially at the [facility]. This is progressive, with increase collateral veins identified about the mediastinum.  Soft tissue infiltration throughout the mediastinum again identified. An example area measures 1.9 cm within the right paratracheal space today on image 15 and is felt to be similar. No well-defined hilar adenopathy. Fat containing left-sided Bochdalek's hernia.  Lungs/Pleura: Moderate right-sided pleural effusion, increased. No evidence of pleural mass. Secretions within the trachea and right mainstem bronchus. Similar mass effect upon right lower lobe bronchi, which remain patent.  Mild centrilobular emphysema. Diminished right lung volume, secondary to pleural fluid. Given this difference, similar appearance of paramediastinal radiation fibrosis.  Musculoskeletal: No osseous metastasis identified.  CT ABDOMEN AND PELVIS FINDINGS  Hepatobiliary: Scattered hepatic cysts. Normal gallbladder, without biliary ductal dilatation.  Pancreas: Normal, without mass or ductal dilatation.  Spleen: Normal  Adrenals/Urinary Tract: Mild bilateral adrenal nodularity, without dominant mass. Mild renal cortical thinning bilaterally. Left renal cysts and right renal too small to characterize lesions. No hydronephrosis. Normal urinary bladder.  Stomach/Bowel: Normal stomach, without wall thickening. Normal colon and terminal ileum. Normal small bowel.  Vascular/Lymphatic: Aortic and branch vessel atherosclerosis. Infrarenal  abdominal aortic aneurysm again identified. Similar, measuring maximally 3.5 x 3.3 cm. No surrounding hemorrhage. No abdominopelvic adenopathy.  Reproductive: Radiation seeds in the prostate  Other: No significant free fluid.  Musculoskeletal: Insufficiency or pathologic fracture involving the right side of the sacrum is similar. A lytic lesion within the right side of the L5 vertebral body is increased in conspicuity, including a 2.3 cm on image 90 of series 3.  IMPRESSION: CT CHEST IMPRESSION  1. Radiation fibrosis within the paramediastinal right lung, with similar soft tissue infiltration in the right-sided mediastinum. Favored to be radiation induced. 2. Increase in moderate right pleural effusion. 3. Left lower lobe pulmonary emboli. Critical test results telephoned to Dr[facility] Second. at the time of interpretation at . 11:25 a.m.on 01/22/2015. 4. Progressive central venous insufficiency, likely at the SVC.  CT ABDOMEN AND PELVIS IMPRESSION  1. Slight progression of osseous metastasis at L5. 2. No extraosseous disease progression identified; similar right adrenal nodularity. 3. Infrarenal abdominal aortic aneurysm, similar.   Electronically Signed   By: Abigail Miyamoto M.D.   On: 01/22/2015 11:35   Ct Abdomen Pelvis W Contrast  01/22/2015   CLINICAL DATA:  Lung cancer diagnosed 8/15 with metastasis. Prostate cancer in 2014. Ongoing chemotherapy. Ex-smoker; quit in 1988.  EXAM: CT CHEST, ABDOMEN, AND PELVIS WITH CONTRAST  TECHNIQUE: Multidetector CT imaging of the chest, abdomen and pelvis was performed following the standard protocol during bolus administration of intravenous contrast.  CONTRAST:  147m OMNIPAQUE IOHEXOL 300 MG/ML  SOLN  COMPARISON:  11/25/2014  FINDINGS: CT CHEST FINDINGS  Mediastinum/Nodes: No supraclavicular adenopathy. Aortic and branch vessel atherosclerosis. Normal heart size, without pericardial effusion. Filling defects within left lower lobe pulmonary artery branches, including on image  32 of series 3. Nonocclusive.  Central venous insufficiency, at least partially at the [facility] This is progressive, with increase collateral veins identified about the mediastinum.  Soft tissue infiltration throughout the mediastinum again identified. An example area measures 1.9 cm within the right paratracheal space today on image 15 and is felt to be similar. No well-defined hilar adenopathy. Fat containing left-sided Bochdalek's hernia.  Lungs/Pleura: Moderate right-sided pleural effusion, increased. No evidence of pleural mass. Secretions within the trachea and  right mainstem bronchus. Similar mass effect upon right lower lobe bronchi, which remain patent.  Mild centrilobular emphysema. Diminished right lung volume, secondary to pleural fluid. Given this difference, similar appearance of paramediastinal radiation fibrosis.  Musculoskeletal: No osseous metastasis identified.  CT ABDOMEN AND PELVIS FINDINGS  Hepatobiliary: Scattered hepatic cysts. Normal gallbladder, without biliary ductal dilatation.  Pancreas: Normal, without mass or ductal dilatation.  Spleen: Normal  Adrenals/Urinary Tract: Mild bilateral adrenal nodularity, without dominant mass. Mild renal cortical thinning bilaterally. Left renal cysts and right renal too small to characterize lesions. No hydronephrosis. Normal urinary bladder.  Stomach/Bowel: Normal stomach, without wall thickening. Normal colon and terminal ileum. Normal small bowel.  Vascular/Lymphatic: Aortic and branch vessel atherosclerosis. Infrarenal abdominal aortic aneurysm again identified. Similar, measuring maximally 3.5 x 3.3 cm. No surrounding hemorrhage. No abdominopelvic adenopathy.  Reproductive: Radiation seeds in the prostate  Other: No significant free fluid.  Musculoskeletal: Insufficiency or pathologic fracture involving the right side of the sacrum is similar. A lytic lesion within the right side of the L5 vertebral body is increased in conspicuity, including a 2.3 cm  on image 90 of series 3.  IMPRESSION: CT CHEST IMPRESSION  1. Radiation fibrosis within the paramediastinal right lung, with similar soft tissue infiltration in the right-sided mediastinum. Favored to be radiation induced. 2. Increase in moderate right pleural effusion. 3. Left lower lobe pulmonary emboli. Critical test results telephoned to [facility] Second. at the time of interpretation at . 11:25 a.m.on 01/22/2015. 4. Progressive central venous insufficiency, likely at the SVC.  CT ABDOMEN AND PELVIS IMPRESSION  1. Slight progression of osseous metastasis at L5. 2. No extraosseous disease progression identified; similar right adrenal nodularity. 3. Infrarenal abdominal aortic aneurysm, similar.   Electronically Signed   By: Abigail Miyamoto M.D.   On: 01/22/2015 11:35    ASSESSMENT AND PLAN: this is a very pleasant 79 years old white male with stage IV non-small cell lung cancer, adenocarcinoma with positive PDL 1 expression, completed systemic chemotherapy with carboplatin and Alimta status post 6 cycles and tolerated his treatment fairly well.  He is currently on treatment with immunotherapy with Ketruda (pembrolizumab) status post 6 cycles with stable disease in the chest, abdomen and pelvis. Unfortunately the patient was found to have metastatic brain lesions and he underwent craniotomy with resection of the brain tumor.  The patient is related to proceed with cycle #9 today as a scheduled. He would come back for follow-up visit in 3 weeks for reevaluation before starting the next cycle of his treatment. For the patient, he was started on Remeron 30 mg by mouth daily at bedtime and feeling much better. For the swelling of the right upper extremity and neck area, I started the patient on Lasix 20 mg by mouth daily as needed for swelling.  For pain management, he was given a refill of OxyIR 5 mg by mouth every 6 hours as needed. He was advised to call if he has any concerning symptoms in the interval. The  patient was advised to call immediately if he has any concerning symptoms in the interval. The patient voices understanding of current disease status and treatment options and is in agreement with the current care plan.  All questions were answered. The patient knows to call the clinic with any problems, questions or concerns. We can certainly see the patient much sooner if necessary.  Disclaimer: This note was dictated with voice recognition software. Similar sounding words can inadvertently be transcribed and may not be corrected  upon review.

## 2015-02-17 NOTE — Patient Instructions (Signed)
[facility] Discharge Instructions for Patients Receiving Chemotherapy  Today you received the following chemotherapy agents: Keytruda.  To help prevent nausea and vomiting after your treatment, we encourage you to take your nausea medication: Compazine 10 mg every 6 hours as needed.   If you develop nausea and vomiting that is not controlled by your nausea medication, call the clinic.   BELOW ARE SYMPTOMS THAT SHOULD BE REPORTED IMMEDIATELY:  *FEVER GREATER THAN 100.5 F  *CHILLS WITH OR WITHOUT FEVER  NAUSEA AND VOMITING THAT IS NOT CONTROLLED WITH YOUR NAUSEA MEDICATION  *UNUSUAL SHORTNESS OF BREATH  *UNUSUAL BRUISING OR BLEEDING  TENDERNESS IN MOUTH AND THROAT WITH OR WITHOUT PRESENCE OF ULCERS  *URINARY PROBLEMS  *BOWEL PROBLEMS  UNUSUAL RASH Items with * indicate a potential emergency and should be followed up as soon as possible.  Feel free to call the clinic you have any questions or concerns. The clinic phone number is [phone].  Please show the CHEMO ALERT CARD at check-in to the Emergency Department and triage nurse.   

## 2015-02-18 ENCOUNTER — Ambulatory Visit: Payer: Medicare Other | Admitting: Physical Therapy

## 2015-02-24 ENCOUNTER — Ambulatory Visit: Payer: Medicare Other | Admitting: Physical Therapy

## 2015-02-24 ENCOUNTER — Ambulatory Visit: Payer: Medicare Other | Admitting: Occupational Therapy

## 2015-02-24 VITALS — HR 104

## 2015-02-24 DIAGNOSIS — R279 Unspecified lack of coordination: Secondary | ICD-10-CM

## 2015-02-24 DIAGNOSIS — R29898 Other symptoms and signs involving the musculoskeletal system: Secondary | ICD-10-CM | POA: Diagnosis not present

## 2015-02-24 DIAGNOSIS — R269 Unspecified abnormalities of gait and mobility: Secondary | ICD-10-CM

## 2015-02-24 DIAGNOSIS — M6281 Muscle weakness (generalized): Secondary | ICD-10-CM

## 2015-02-24 DIAGNOSIS — R6889 Other general symptoms and signs: Secondary | ICD-10-CM

## 2015-02-24 NOTE — Patient Instructions (Addendum)
  Coordination Activities  Perform the following activities for 20 minutes 1-2 times per day with right hand(s).   Rotate ball in fingertips (clockwise and counter-clockwise).  Toss ball in air and catch with the same hand.  Let someone else get it if it drops.  Flip cards 1 at a time as fast as you can.  Deal cards with your thumb (Hold deck in hand and push card off top with thumb).  Rotate card in hand (clockwise and counter-clockwise).  Shuffle cards.  Pick up coins, buttons, marbles, dried beans/pasta of different sizes and place in container.  Pick up coins and place in container or coin bank.  Practice writing using foam on pen.  Squeeze red putty with your whole hand 15-20 times.  Roll out red putty and pinch with each finger and thumb.  Roll out 3 times.

## 2015-02-24 NOTE — Therapy (Signed)
[facility] [address] [place], [place], [address] Phone: [phone]   Fax:  [phone]  Physical Therapy Treatment  Patient Details  Name: Nicholas Schmitt MRN: [id number] Date of Birth: 1935-12-24 Referring Provider:  Dorena Cookey, MD  Encounter Date: 02/24/2015      PT End of Session - 02/24/15 1418    Visit Number 2   Number of Visits 17   Date for PT Re-Evaluation 04/14/15   Authorization Type BCBS Medicare-G-code required every 10th visit   PT Start Time 1103   PT Stop Time 1143   PT Time Calculation (min) 40 min   Equipment Utilized During Treatment Gait belt   Activity Tolerance Patient limited by fatigue  Requires seated rest breaks; c/o fatigue today after doing too much yesterday   Behavior During Therapy [facility] for tasks assessed/performed      Past Medical History  Diagnosis Date  . CAD (coronary artery disease)     positive stress test in 2006 led to left heart cath (8/06) showing 95% prox RCA, 90% CFX, and 90% mLAD.  patient had a cypher DES to all 3 lesions  . Hyperlipidemia   . Hearing loss   . AAA (abdominal aortic aneurysm) 11/09    3.2 cm   . Chronic renal insufficiency   . History of radiation therapy 03/08/04- 04/08/04    prostate 4600 cGy in 3 fractions, radioactive seed implant 05/04/04  . Myocardial infarction 1995  . Pacemaker 2014  . Dysrhythmia   . Shortness of breath     exertion  . Asthma     bronchial  . COPD (chronic obstructive pulmonary disease)   . Radiation Jan.11-Jan 29/2016    Right central chest 35 gray in 14 fractions  . Radiation Jan.2016    35 gray in 14 fx lower lumbar/upper sacrum  . Diverticulosis   . Hemorrhoids   . Pneumonia     hx  . GERD (gastroesophageal reflux disease)   . Hypertension     no med now for 3-4 months  . Cancer 2015    prostate   . Prostate cancer 2005    gleason 7  . Skin cancer     ear  . Lung cancer   . Cancer December 04, 2014    Cranial     Past Surgical History  Procedure Laterality Date  . Ptca  2006  . Coronary stent placement    . Permanent pacemaker insertion Bilateral 02/26/13    MDT Adapta L implanted by Dr Rayann Heman for sick sinus syndrome  . Radioactive seed implant  05/04/2004    8000 cGy, Dr Danny Lawless Dr Reece Agar  . Elbow surgery Bilateral 1973  . Coronary angioplasty    . Mediastinoscopy N/A 01/28/2014    Procedure: MEDIASTINOSCOPY;  Surgeon: Melrose Nakayama, MD;  Location: [facility];  Service: Thoracic;  Laterality: N/A;  . Permanent pacemaker insertion N/A 02/26/2013    Procedure: PERMANENT PACEMAKER INSERTION;  Surgeon: [name], MD;  Location: [facility] CATH LAB;  Service: Cardiovascular;  Laterality: N/A;  . Insert / replace / remove pacemaker      not removed  . Craniotomy N/A 12/04/2014    Procedure: CRANIOTOMY TUMOR EXCISION, LEFT;  Surgeon: [name], MD;  Location: [facility]URO [facility]S;  Service: Neurosurgery;  Laterality: N/A;  CRANIOTOMY TUMOR EXCISION  . Application of cranial navigation  12/04/2014    Procedure: APPLICATION OF CRANIAL NAVIGATION;  Surgeon: [name], MD;  Location: [facility]  NEURO ORS;  Service: Neurosurgery;;    Filed Vitals:   02/24/15 1115 02/24/15 1125  Pulse: 114 104  SpO2: 93% 97%    Visit Diagnosis:  Abnormality of gait  Muscle weakness of lower extremity  Decreased functional activity tolerance      Subjective Assessment - 02/24/15 1105    Subjective No falls, no pain reported during session today   Currently in Pain? No/denies                         [facility] Adult PT Treatment/Exercise - 02/24/15 1108    Transfers   Transfers Sit to Stand;Stand to Sit   Sit to Stand 4: Min guard   Stand to Sit 4: Min guard   Exercises   Exercises Knee/Hip   Knee/Hip Exercises: Standing   Hip Abduction 1 set;10 reps;Stengthening;Right;Left  UE support at counter   Hip Extension 1 set;10 reps;Right;Left;Stengthening  UE support at counter    Extension Limitations requires seated rest break after 2 standing exercises due to fatigue; cues for eccentric control of standing exercises   Other Standing Knee Exercises Repeated sit<>stand from 20 inch mat, 2 sets x 5 reps, with UE support and min guard assistance   Other Standing Knee Exercises Seated ankle pumps 2 sets x 10 reps   Knee/Hip Exercises: Supine   Bridges 1 set;10 reps;Strengthening   Bridges with [employer] Strengthening;1 set;10 reps  cues for technique   Other Supine Knee/Hip Exercises Hip/knee flexion on and off mat to floor, 2 sets x 10 with initial tactile and verbal cues                  PT Short Term Goals - 02/12/15 1417    PT SHORT TERM GOAL #1   Title Pt will perform HEP with family supervision for improved strength, balance, mobility.  Target 03/14/15   Time 4   Period Weeks   Status New   PT SHORT TERM GOAL #2   Title Pt will improve Berg Balance score to at least 37/56 for decreased fall risk.   Baseline 32/56 at eval   Time 4   Period Weeks   Status New   PT SHORT TERM GOAL #3   Title Pt will improve TUG score to less than or equal to 13.5 seconds for decreased fall risk.   Baseline 13.6 sec at eval   Time 4   Period Weeks   Status New   PT SHORT TERM GOAL #4   Title Pt will ambulate at least 200 ft. using least restrictive assistive device (cane vs. RW) modified independently, for improved functional mobility.   Time 4   Period Weeks   Status New           PT Long Term Goals - 02/12/15 1419    PT LONG TERM GOAL #1   Title Pt/family will verbalize understanding of fall prevention within home environment.  TARGET 04/14/15   Time 8   Period Weeks   Status New   PT LONG TERM GOAL #2   Title Pt will improve Berg Balance score to at least 42/56 for decreased fall risk.   Time 8   Period Weeks   Status New   PT LONG TERM GOAL #3   Title Pt will improve gait velocity to at least 2.62 ft/sec for improved gait efficiency and  safety.   Time 8   Period Weeks   Status New   PT  LONG TERM GOAL #4   Title Pt will ambulate at least 500 ft, indoor and outdoor surfaces, modified independently, for improved functional mobility.   Time 8   Period Weeks   Status New   PT LONG TERM GOAL #5   Title Pt will verbalize plans for continued community fitness upon D/C from PT.   Time 8   Status New               Plan - 02/24/15 1419    Clinical Impression Statement Worked on various positions with exercise today in preparation for HEP; no exercises given today-pt needs frequent rest breaks and cueing for proper technique of exercises.  Plan to work on leg strenghthening and counter balance activities next visit to possibly add to HEP.   Pt will benefit from skilled therapeutic intervention in order to improve on the following deficits Abnormal gait;Decreased activity tolerance;Decreased balance;Decreased mobility;Decreased safety awareness;Decreased strength;Difficulty walking   Rehab Potential Good   PT Frequency 2x / week   PT Duration 8 weeks   PT Treatment/Interventions ADLs/Self Care Home Management;Therapeutic exercise;Therapeutic activities;Functional mobility training;Gait training;Patient/family education;DME Instruction;Neuromuscular re-education;Balance training   PT Next Visit Plan HEP-strength and balance; walking program with assistive device for home   Consulted and Agree with Plan of Care Patient;Family member/caregiver   Family Member Consulted girlfriend-June        Problem List Patient Active Problem List   Diagnosis Date Noted  . Hyperbilirubinemia 01/17/2015  . DVT (deep venous thrombosis) 01/17/2015  . Sensation of cold in leg 01/06/2015  . Pain of left leg 01/06/2015  . Metastasis to brain 12/04/2014  . Brain metastasis 11/17/2014  . Encounter for antineoplastic immunotherapy 11/17/2014  . Other pancytopenia   . Acute respiratory failure with hypoxia   . Antineoplastic chemotherapy  induced pancytopenia   . SOB (shortness of breath) 07/21/2014  . CAP (community acquired pneumonia) 07/21/2014  . Neoplasm related pain 06/18/2014  . Anemia in neoplastic disease 06/04/2014  . Dyspnea 06/02/2014  . Acute right hip pain 05/26/2014  . Non-small cell carcinoma of lung, stage 4 01/30/2014  . Nodule of right lung 01/21/2014  . Mediastinal adenopathy 01/21/2014  . Prostate cancer   . History of radiation therapy   . 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 Sick sinus syndrome 04/16/2013  . Cardiac arrhythmia 07/12/2011  . COPD GOLD II 08/27/2010  . Abdominal aortic aneurysm 05/14/2008  . ACTINIC KERATOSIS, HEAD 02/11/2008  . LOSS, CONDUCTIVE HEARING, BILATERAL 01/18/2007  . HLD (hyperlipidemia) 12/25/2006  . Essential hypertension 12/25/2006  . Coronary atherosclerosis 12/25/2006    Chasta Deshpande W. 02/24/2015, 2:22 PM  Frazier Butt., PT  [facility] [address] [place], [place], [address] Phone: [phone]   Fax:  [phone]

## 2015-02-24 NOTE — Therapy (Signed)
[facility] [address] [place], [place], [address] Phone: [phone]   Fax:  [phone]  Occupational Therapy Treatment  Patient Details  Name: Nicholas Schmitt MRN: [id number] Date of Birth: Jun 12, 1936 Referring Provider:  Dorena Cookey, MD  Encounter Date: 02/24/2015      OT End of Session - 02/24/15 1158    Visit Number 2   Number of Visits 17   Date for OT Re-Evaluation 04/13/15   Authorization Type Blue MCR - G code   Authorization - Visit Number 2   Authorization - Number of Visits 10  G   OT Start Time 1150   OT Stop Time 1230   OT Time Calculation (min) 40 min   Activity Tolerance Patient tolerated treatment well   Behavior During Therapy [facility] for tasks assessed/performed      Past Medical History  Diagnosis Date  . CAD (coronary artery disease)     positive stress test in 2006 led to left heart cath (8/06) showing 95% prox RCA, 90% CFX, and 90% mLAD.  patient had a cypher DES to all 3 lesions  . Hyperlipidemia   . Hearing loss   . AAA (abdominal aortic aneurysm) 11/09    3.2 cm   . Chronic renal insufficiency   . History of radiation therapy 03/08/04- 04/08/04    prostate 4600 cGy in 3 fractions, radioactive seed implant 05/04/04  . Myocardial infarction 1995  . Pacemaker 2014  . Dysrhythmia   . Shortness of breath     exertion  . Asthma     bronchial  . COPD (chronic obstructive pulmonary disease)   . Radiation Jan.11-Jan 29/2016    Right central chest 35 gray in 14 fractions  . Radiation Jan.2016    35 gray in 14 fx lower lumbar/upper sacrum  . Diverticulosis   . Hemorrhoids   . Pneumonia     hx  . GERD (gastroesophageal reflux disease)   . Hypertension     no med now for 3-4 months  . Cancer 2015    prostate   . Prostate cancer 2005    gleason 7  . Skin cancer     ear  . Lung cancer   . Cancer December 04, 2014    Cranial     Past Surgical History  Procedure Laterality  Date  . Ptca  2006  . Coronary stent placement    . Permanent pacemaker insertion Bilateral 02/26/13    MDT Adapta L implanted by Dr Rayann Heman for sick sinus syndrome  . Radioactive seed implant  05/04/2004    8000 cGy, Dr Danny Lawless Dr Reece Agar  . Elbow surgery Bilateral 1973  . Coronary angioplasty    . Mediastinoscopy N/A 01/28/2014    Procedure: MEDIASTINOSCOPY;  Surgeon: Melrose Nakayama, MD;  Location: [facility];  Service: Thoracic;  Laterality: N/A;  . Permanent pacemaker insertion N/A 02/26/2013    Procedure: PERMANENT PACEMAKER INSERTION;  Surgeon: [name], MD;  Location: [facility] CATH LAB;  Service: Cardiovascular;  Laterality: N/A;  . Insert / replace / remove pacemaker      not removed  . Craniotomy N/A 12/04/2014    Procedure: CRANIOTOMY TUMOR EXCISION, LEFT;  Surgeon: [name], MD;  Location: [facility] NEUR[facility];  Service: Neurosurgery;  Laterality: N/A;  CRANIOTOMY TUMOR EXCISION  . Application of cranial navigation  12/04/2014    Procedure: APPLICATION OF CRANIAL NAVIGATION;  Surgeon: [name], MD;  Location: [facility];  Service: Neurosurgery;;  There were no vitals filed for this visit.  Visit Diagnosis:  Lack of coordination  Weakness of right arm  Weakness of right hand      Subjective Assessment - 02/24/15 1154    Subjective  "I'm doing ok, just a little tired"   Patient Stated Goals get my dominant hand working good   Currently in Pain? Yes   Pain Score 5    Pain Location Leg   Pain Orientation Right   Pain Descriptors / Indicators Aching   Aggravating Factors  unsure (where DVT is)   Pain Relieving Factors medication                      OT Treatments/Exercises (OP) - 02/24/15 0001    ADLs   Writing Practiced writing with foam grip (and issued for home as pt reports incr ease) with approx 80% legibility overall for name and address.                OT Education - 02/24/15 1301    Education provided Yes   Education Details  Coordination, Red Putty HEP, use of tan foam for pen when writing  (see pt instructions)   Person(s) Educated Patient   Methods Explanation;Demonstration;Verbal cues;Handout   Comprehension Returned demonstration;Verbalized understanding;Verbal cues required;Need further instruction          OT Short Term Goals - 02/12/15 1518    OT SHORT TERM GOAL #1   Title Independent with coordination and putty HEP for Rt hand (due 03/14/15)   Time 4   Period Weeks   Status New   OT SHORT TERM GOAL #2   Title Improve grip strength by 10 lbs or greater Rt hand    Baseline Rt = 30 (Lt = 62 lbs)   Time 4   Period Weeks   Status New   OT SHORT TERM GOAL #3   Title Improve coordination as evidenced by performing 9 hole peg test in 90 sec or less   Baseline 102.85 sec.    Time 4   Period Weeks   Status New   OT SHORT TERM GOAL #4   Title Pt to write name at 90% or greater legibility   Baseline 75%   Time 4   Period Weeks   Status New           OT Long Term Goals - 02/12/15 1522    OT LONG TERM GOAL #1   Title Independent w/ strengthening HEP for RUE - due 04/13/15   Time 8   Period Weeks   Status New   OT LONG TERM GOAL #2   Title Improve grip strength to 50 lbs or greater Rt hand   Baseline eval = 30 lbs   Time 8   Period Weeks   Status New   OT LONG TERM GOAL #3   Title Improve coordination as evidenced by performing 9 hole peg test in 75 sec. or less   Baseline eval = 102.85 sec.    Time 8   Period Weeks   Status New   OT LONG TERM GOAL #4   Title Pt to write check at 90% legibility or greater   Baseline eval = 75% name only   Time 8   Period Weeks   Status New   OT LONG TERM GOAL #5   Title Pt to prepare simple meal (stovetop) with distant supervision safely   Time 8   Period Weeks   Status  New               Plan - 02/24/15 1211    Clinical Impression Statement Pt demo improved coordintation for tasks with repetition.  Pt verbalized understanding of  education provided, but needed repetition so he would benefit from review/repetition.   Plan review HEP, continue with coordination/strength for RUE   OT Home Exercise Plan education issued:  02/24/15 coordination/red putty HEP   Consulted and Agree with Plan of Care Patient        Problem List Patient Active Problem List   Diagnosis Date Noted  . Hyperbilirubinemia 01/17/2015  . DVT (deep venous thrombosis) 01/17/2015  . Sensation of cold in leg 01/06/2015  . Pain of left leg 01/06/2015  . Metastasis to brain 12/04/2014  . Brain metastasis 11/17/2014  . Encounter for antineoplastic immunotherapy 11/17/2014  . Other pancytopenia   . Acute respiratory failure with hypoxia   . Antineoplastic chemotherapy induced pancytopenia   . SOB (shortness of breath) 07/21/2014  . CAP (community acquired pneumonia) 07/21/2014  . Neoplasm related pain 06/18/2014  . Anemia in neoplastic disease 06/04/2014  . Dyspnea 06/02/2014  . Acute right hip pain 05/26/2014  . Non-small cell carcinoma of lung, stage 4 01/30/2014  . Nodule of right lung 01/21/2014  . Mediastinal adenopathy 01/21/2014  . Prostate cancer   . History of radiation therapy   . 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 Sick sinus syndrome 04/16/2013  . Cardiac arrhythmia 07/12/2011  . COPD GOLD II 08/27/2010  . Abdominal aortic aneurysm 05/14/2008  . ACTINIC KERATOSIS, HEAD 02/11/2008  . LOSS, CONDUCTIVE HEARING, BILATERAL 01/18/2007  . HLD (hyperlipidemia) 12/25/2006  . Essential hypertension 12/25/2006  . Coronary atherosclerosis 12/25/2006    [facility] 02/24/2015, 1:03 PM  [facility] [address] [place], [place], [address] Phone: [phone]   Fax:  [place], OTR/L 02/24/2015 1:03 PM

## 2015-02-25 ENCOUNTER — Ambulatory Visit: Payer: Medicare Other

## 2015-02-25 ENCOUNTER — Ambulatory Visit: Payer: Medicare Other | Admitting: Occupational Therapy

## 2015-02-25 ENCOUNTER — Ambulatory Visit: Payer: Medicare Other | Admitting: Physical Therapy

## 2015-02-25 DIAGNOSIS — R4701 Aphasia: Secondary | ICD-10-CM

## 2015-02-25 DIAGNOSIS — R29898 Other symptoms and signs involving the musculoskeletal system: Secondary | ICD-10-CM | POA: Diagnosis not present

## 2015-02-25 DIAGNOSIS — R6889 Other general symptoms and signs: Secondary | ICD-10-CM

## 2015-02-25 DIAGNOSIS — R279 Unspecified lack of coordination: Secondary | ICD-10-CM

## 2015-02-25 DIAGNOSIS — M6281 Muscle weakness (generalized): Secondary | ICD-10-CM

## 2015-02-25 NOTE — Therapy (Signed)
[facility] [address] [place], [place], [address] Phone: [phone]   Fax:  [phone]  Physical Therapy Treatment  Patient Details  Name: Nicholas Schmitt MRN: [id number] Date of Birth: April 05, 1936 Referring Provider:  Dorena Cookey, MD  Encounter Date: 02/25/2015      PT End of Session - 02/25/15 1106    Visit Number 3   Number of Visits 17   Date for PT Re-Evaluation 04/14/15   Authorization Type BCBS Medicare-G-code required every 10th visit   PT Start Time 1019   PT Stop Time 1058  Pt in bathroom twice during session   PT Time Calculation (min) 39 min   Equipment Utilized During Treatment Gait belt   Activity Tolerance Patient limited by fatigue  needed 2 bathroom breaks   Behavior During Therapy [facility] for tasks assessed/performed      Past Medical History  Diagnosis Date  . CAD (coronary artery disease)     positive stress test in 2006 led to left heart cath (8/06) showing 95% prox RCA, 90% CFX, and 90% mLAD.  patient had a cypher DES to all 3 lesions  . Hyperlipidemia   . Hearing loss   . AAA (abdominal aortic aneurysm) 11/09    3.2 cm   . Chronic renal insufficiency   . History of radiation therapy 03/08/04- 04/08/04    prostate 4600 cGy in 3 fractions, radioactive seed implant 05/04/04  . Myocardial infarction 1995  . Pacemaker 2014  . Dysrhythmia   . Shortness of breath     exertion  . Asthma     bronchial  . COPD (chronic obstructive pulmonary disease)   . Radiation Jan.11-Jan 29/2016    Right central chest 35 gray in 14 fractions  . Radiation Jan.2016    35 gray in 14 fx lower lumbar/upper sacrum  . Diverticulosis   . Hemorrhoids   . Pneumonia     hx  . GERD (gastroesophageal reflux disease)   . Hypertension     no med now for 3-4 months  . Cancer 2015    prostate   . Prostate cancer 2005    gleason 7  . Skin cancer     ear  . Lung cancer   . Cancer December 04, 2014     Cranial     Past Surgical History  Procedure Laterality Date  . Ptca  2006  . Coronary stent placement    . Permanent pacemaker insertion Bilateral 02/26/13    MDT Adapta L implanted by Dr Rayann Heman for sick sinus syndrome  . Radioactive seed implant  05/04/2004    8000 cGy, Dr Danny Lawless Dr Reece Agar  . Elbow surgery Bilateral 1973  . Coronary angioplasty    . Mediastinoscopy N/A 01/28/2014    Procedure: MEDIASTINOSCOPY;  Surgeon: Melrose Nakayama, MD;  Location: [facility];  Service: Thoracic;  Laterality: N/A;  . Permanent pacemaker insertion N/A 02/26/2013    Procedure: PERMANENT PACEMAKER INSERTION;  Surgeon: [name], MD;  Location: [facility] CATH LAB;  Service: Cardiovascular;  Laterality: N/A;  . Insert / replace / remove pacemaker      not removed  . Craniotomy N/A 12/04/2014    Procedure: CRANIOTOMY TUMOR EXCISION, LEFT;  Surgeon: [name], MD;  Location: [facility];  Service: Neurosurgery;  Laterality: N/A;  CRANIOTOMY TUMOR EXCISION  . Application of cranial navigation  12/04/2014    Procedure: APPLICATION OF CRANIAL NAVIGATION;  Surgeon: [name], MD;  Location: [facility]  ORS;  Service: Neurosurgery;;    There were no vitals filed for this visit.  Visit Diagnosis:  Muscle weakness of lower extremity  Decreased functional activity tolerance      Subjective Assessment - 02/25/15 1024    Subjective Feels weak today-per girlfriend, pt has constipation often and has taken laxatives for it   Currently in Pain? Yes   Pain Score 4    Pain Location Leg   Pain Orientation Right   Pain Descriptors / Indicators Aching   Pain Type Chronic pain   Pain Onset More than a month ago   Pain Frequency Constant   Aggravating Factors  unsure   Pain Relieving Factors medicine                         [facility] Adult PT Treatment/Exercise - 02/25/15 1027    High Level Balance   High Level Balance Activities Side stepping  Attempted-pt needs verbal cues positioning    Knee/Hip Exercises: Standing   Functional Squat 10 reps  At chair   Other Standing Knee Exercises Seated ankle pumps 2 sets x 10 reps   Knee/Hip Exercises: Seated   Long Arc Quad Right;Left;5 sets;2 sets   Long Arc Quad Limitations Pt goes into posterior pelvic tilt with LAQ   Heel Slides AROM;AAROM;2 sets;10 reps;Right;Left  feet propped on pool noodle for ease of movement   Knee/Hip Flexion 2 sets of 5 reps      Checked O2 sats during exercises:  91-92%, HR 104 bpm.   -Seated posture exercises:  Upright posture x 5 reps; scapular retraction x 10 reps with tactile cues              PT Short Term Goals - 02/12/15 1417    PT SHORT TERM GOAL #1   Title Pt will perform HEP with family supervision for improved strength, balance, mobility.  Target 03/14/15   Time 4   Period Weeks   Status New   PT SHORT TERM GOAL #2   Title Pt will improve Berg Balance score to at least 37/56 for decreased fall risk.   Baseline 32/56 at eval   Time 4   Period Weeks   Status New   PT SHORT TERM GOAL #3   Title Pt will improve TUG score to less than or equal to 13.5 seconds for decreased fall risk.   Baseline 13.6 sec at eval   Time 4   Period Weeks   Status New   PT SHORT TERM GOAL #4   Title Pt will ambulate at least 200 ft. using least restrictive assistive device (cane vs. RW) modified independently, for improved functional mobility.   Time 4   Period Weeks   Status New           PT Long Term Goals - 02/12/15 1419    PT LONG TERM GOAL #1   Title Pt/family will verbalize understanding of fall prevention within home environment.  TARGET 04/14/15   Time 8   Period Weeks   Status New   PT LONG TERM GOAL #2   Title Pt will improve Berg Balance score to at least 42/56 for decreased fall risk.   Time 8   Period Weeks   Status New   PT LONG TERM GOAL #3   Title Pt will improve gait velocity to at least 2.62 ft/sec for improved gait efficiency and safety.   Time 8   Period Weeks  Status New   PT LONG TERM GOAL #4   Title Pt will ambulate at least 500 ft, indoor and outdoor surfaces, modified independently, for improved functional mobility.   Time 8   Period Weeks   Status New   PT LONG TERM GOAL #5   Title Pt will verbalize plans for continued community fitness upon D/C from PT.   Time 8   Status New               Plan - 02/25/15 1108    Clinical Impression Statement Treatment session interrupted today twice due to bathroom breaks (per girlfriend-constipation>laxative>needing to use bathroom).  Pt needs frequent rest breaks during treatment.  With seated and standing exercises, he needs frequent verbal and tactile cues for proper technique and slowed performance of exercises.   Pt will benefit from skilled therapeutic intervention in order to improve on the following deficits Abnormal gait;Decreased activity tolerance;Decreased balance;Decreased mobility;Decreased safety awareness;Decreased strength;Difficulty walking   Rehab Potential Good   PT Frequency 2x / week   PT Duration 8 weeks   PT Treatment/Interventions ADLs/Self Care Home Management;Therapeutic exercise;Therapeutic activities;Functional mobility training;Gait training;Patient/family education;DME Instruction;Neuromuscular re-education;Balance training   PT Next Visit Plan HEP-strength and balance; walking program with assistive device for home   Consulted and Agree with Plan of Care Patient;Family member/caregiver   Family Member Consulted girlfriend-June        Problem List Patient Active Problem List   Diagnosis Date Noted  . Hyperbilirubinemia 01/17/2015  . DVT (deep venous thrombosis) 01/17/2015  . Sensation of cold in leg 01/06/2015  . Pain of left leg 01/06/2015  . Metastasis to brain 12/04/2014  . Brain metastasis 11/17/2014  . Encounter for antineoplastic immunotherapy 11/17/2014  . Other pancytopenia   . Acute respiratory failure with hypoxia   . Antineoplastic  chemotherapy induced pancytopenia   . SOB (shortness of breath) 07/21/2014  . CAP (community acquired pneumonia) 07/21/2014  . Neoplasm related pain 06/18/2014  . Anemia in neoplastic disease 06/04/2014  . Dyspnea 06/02/2014  . Acute right hip pain 05/26/2014  . Non-small cell carcinoma of lung, stage 4 01/30/2014  . Nodule of right lung 01/21/2014  . Mediastinal adenopathy 01/21/2014  . Prostate cancer   . History of radiation therapy   . 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 Sick sinus syndrome 04/16/2013  . Cardiac arrhythmia 07/12/2011  . COPD GOLD II 08/27/2010  . Abdominal aortic aneurysm 05/14/2008  . ACTINIC KERATOSIS, HEAD 02/11/2008  . LOSS, CONDUCTIVE HEARING, BILATERAL 01/18/2007  . HLD (hyperlipidemia) 12/25/2006  . Essential hypertension 12/25/2006  . Coronary atherosclerosis 12/25/2006    Michalina Calbert W. 02/25/2015, 11:11 AM Frazier Butt., PT [facility] [address] [place], [place], [address] Phone: [phone]   Fax:  [phone]

## 2015-02-25 NOTE — Therapy (Signed)
[facility] [address] [place], [place], [address] Phone: [phone]   Fax:  [phone]  Speech Language Pathology Treatment  Patient Details  Name: Nicholas Schmitt MRN: [id number] Date of Birth: [birth year] Referring Provider:  Dorena Cookey, MD  Encounter Date: 02/25/2015      End of Session - 02/25/15 1403    Visit Number 2   Number of Visits 16   Date for SLP Re-Evaluation 04/13/15   SLP Start Time 1320   SLP Stop Time  1400   SLP Time Calculation (min) 40 min   Activity Tolerance Patient tolerated treatment well      Past Medical History  Diagnosis Date  . CAD (coronary artery disease)     positive stress test in 2006 led to left heart cath (8/06) showing 95% prox RCA, 90% CFX, and 90% mLAD.  patient had a cypher DES to all 3 lesions  . Hyperlipidemia   . Hearing loss   . AAA (abdominal aortic aneurysm) 11/09    3.2 cm   . Chronic renal insufficiency   . History of radiation therapy 03/08/04- 04/08/04    prostate 4600 cGy in 3 fractions, radioactive seed implant 05/04/04  . Myocardial infarction 1995  . Pacemaker 2014  . Dysrhythmia   . Shortness of breath     exertion  . Asthma     bronchial  . COPD (chronic obstructive pulmonary disease)   . Radiation Jan.11-Jan 29/2016    Right central chest 35 gray in 14 fractions  . Radiation Jan.2016    35 gray in 14 fx lower lumbar/upper sacrum  . Diverticulosis   . Hemorrhoids   . Pneumonia     hx  . GERD (gastroesophageal reflux disease)   . Hypertension     no med now for 3-4 months  . Cancer 2015    prostate   . Prostate cancer 2005    gleason 7  . Skin cancer     ear  . Lung cancer   . Cancer December 04, 2014    Cranial     Past Surgical History  Procedure Laterality Date  . Ptca  2006  . Coronary stent placement    . Permanent pacemaker insertion Bilateral 02/26/13    MDT Adapta L implanted by Dr Rayann Heman for sick sinus syndrome  .  Radioactive seed implant  05/04/2004    8000 cGy, Dr Danny Lawless Dr Reece Agar  . Elbow surgery Bilateral 1973  . Coronary angioplasty    . Mediastinoscopy N/A 01/28/2014    Procedure: MEDIASTINOSCOPY;  Surgeon: Melrose Nakayama, MD;  Location: [facility];  Service: Thoracic;  Laterality: N/A;  . Permanent pacemaker insertion N/A 02/26/2013    Procedure: PERMANENT PACEMAKER INSERTION;  Surgeon: [name], MD;  Location: [facility] CATH LAB;  Service: Cardiovascular;  Laterality: N/A;  . Insert / replace / remove pacemaker      not removed  . Craniotomy N/A 12/04/2014    Procedure: CRANIOTOMY TUMOR EXCISION, LEFT;  Surgeon: [name], MD;  Location: [facility];  Service: Neurosurgery;  Laterality: N/A;  CRANIOTOMY TUMOR EXCISION  . Application of cranial navigation  12/04/2014    Procedure: APPLICATION OF CRANIAL NAVIGATION;  Surgeon: [name], MD;  Location: [facility];  Service: Neurosurgery;;    There were no vitals filed for this visit.  Visit Diagnosis: Receptive aphasia  Expressive aphasia      Subjective Assessment - 02/25/15 1324    Subjective "Picked  up some little -- pegs and put them in the holes." (pt, re: what he did with OT)               ADULT SLP TREATMENT - 02/25/15 1325    General Information   Behavior/Cognition Alert;Cooperative;Pleasant mood   Treatment Provided   Treatment provided Cognitive-Linquistic   Pain Assessment   Pain Assessment 0-10   Pain Score 4    Pain Location rt calf   Pain Descriptors / Indicators Sore   Pain Intervention(s) Monitored during session   Cognitive-Linquistic Treatment   Treatment focused on Aphasia   Skilled Treatment Pt engaged SLP in conversation regarding his cancer and his feelings about it. Pt req'd min A from SLP, mostly verbal cues, occasionally from SLP for proper word generation. Pt functional with min SLP questioning cues. SLP facilitated pt's written language skills by having pt write his name his name and  address, requiring min SLP A occasionally for street name - all numbers were WNL. Further, including recpetive language, SLP had pt write words to picture description with 5/5 comprehension but requiring SLP A consistently for writing target words without aphasia.    Assessment / Recommendations / Plan   Plan Continue with current plan of care   Progression Toward Goals   Progression toward goals Progressing toward goals            SLP Short Term Goals - 02/25/15 1327    SLP SHORT TERM GOAL #1   Title pt will complete simple naming tasks with 85% success, functionally   Time 4   Period Weeks   Status On-going   SLP SHORT TERM GOAL #2   Title pt will demo understanding of mod complex linguistic stimuli (directions with before/after, yes/no questions, etc) 75% with occasional min A   Time 4   Period Weeks   Status On-going   SLP SHORT TERM GOAL #3   Title pt will demo understanding of 3-4 sentence paragraphs (longer verbal stimuli) by answering yes/no or "wh" questions with 80% success and occasional min cues   Time 4   Period Weeks   Status On-going   SLP SHORT TERM GOAL #4   Title pt will write name and address without signs of aphasia   Time 4   Period Weeks   Status On-going          SLP Long Term Goals - 02/25/15 1405    SLP LONG TERM GOAL #1   Title pt will demo understanding of functional 10 minutes simple to mod complex conversation with request for repeats allowed   Time 8   Period Weeks   Status On-going   SLP LONG TERM GOAL #2   Title pt will participate in 10 minutes simple conversation with compensations with rare min questioning cues   Time 8   Period Weeks   Status On-going   SLP LONG TERM GOAL #3   Title pt will write simple functional lists with rare min A for aphasic errors   Time 8   Period Weeks   Status On-going          Plan - 02/25/15 1404    Clinical Impression Statement Pt cont with receptive and expressive aphasia due to mets to  brain and subsequent craniotomy. Pt's receptive language appeared improved today over evaluation date; unsure why at this time. ST to cont to improve receptive and expressive language.   Speech Therapy Frequency 2x / week   Duration --  8  weeks   Treatment/Interventions Compensatory strategies;SLP instruction and feedback;Patient/family education;Functional tasks;Cueing hierarchy;Language facilitation   Potential to Achieve Goals Fair   Potential Considerations Co-morbidities        Problem List Patient Active Problem List   Diagnosis Date Noted  . Hyperbilirubinemia 01/17/2015  . DVT (deep venous thrombosis) 01/17/2015  . Sensation of cold in leg 01/06/2015  . Pain of left leg 01/06/2015  . Metastasis to brain 12/04/2014  . Brain metastasis 11/17/2014  . Encounter for antineoplastic immunotherapy 11/17/2014  . Other pancytopenia   . Acute respiratory failure with hypoxia   . Antineoplastic chemotherapy induced pancytopenia   . SOB (shortness of breath) 07/21/2014  . CAP (community acquired pneumonia) 07/21/2014  . Neoplasm related pain 06/18/2014  . Anemia in neoplastic disease 06/04/2014  . Dyspnea 06/02/2014  . Acute right hip pain 05/26/2014  . Non-small cell carcinoma of lung, stage 4 01/30/2014  . Nodule of right lung 01/21/2014  . Mediastinal adenopathy 01/21/2014  . Prostate cancer   . History of radiation therapy   . 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 Sick sinus syndrome 04/16/2013  . Cardiac arrhythmia 07/12/2011  . COPD GOLD II 08/27/2010  . Abdominal aortic aneurysm 05/14/2008  . ACTINIC KERATOSIS, HEAD 02/11/2008  . LOSS, CONDUCTIVE HEARING, BILATERAL 01/18/2007  . HLD (hyperlipidemia) 12/25/2006  . Essential hypertension 12/25/2006  . Coronary atherosclerosis 12/25/2006    [employer] , [place], CCC-SLP  02/25/2015, 2:05 PM  [facility] [address] [place],  [place], [address] Phone: [phone]   Fax:  [phone]

## 2015-02-25 NOTE — Therapy (Signed)
[facility] [address] [place], [place], [address] Phone: [phone]   Fax:  [phone]  Occupational Therapy Treatment  Patient Details  Name: Nicholas Schmitt MRN: [id number] Date of Birth: 10-08-1935 Referring Provider:  Dorena Cookey, MD  Encounter Date: 02/25/2015      OT End of Session - 02/25/15 1142    Visit Number 3   Number of Visits 17   Date for OT Re-Evaluation 04/13/15   Authorization Type Blue MCR - G code   Authorization - Visit Number 3   Authorization - Number of Visits 10   OT Start Time 1105   OT Stop Time 1145   OT Time Calculation (min) 40 min   Activity Tolerance Patient tolerated treatment well      Past Medical History  Diagnosis Date  . CAD (coronary artery disease)     positive stress test in 2006 led to left heart cath (8/06) showing 95% prox RCA, 90% CFX, and 90% mLAD.  patient had a cypher DES to all 3 lesions  . Hyperlipidemia   . Hearing loss   . AAA (abdominal aortic aneurysm) 11/09    3.2 cm   . Chronic renal insufficiency   . History of radiation therapy 03/08/04- 04/08/04    prostate 4600 cGy in 3 fractions, radioactive seed implant 05/04/04  . Myocardial infarction 1995  . Pacemaker 2014  . Dysrhythmia   . Shortness of breath     exertion  . Asthma     bronchial  . COPD (chronic obstructive pulmonary disease)   . Radiation Jan.11-Jan 29/2016    Right central chest 35 gray in 14 fractions  . Radiation Jan.2016    35 gray in 14 fx lower lumbar/upper sacrum  . Diverticulosis   . Hemorrhoids   . Pneumonia     hx  . GERD (gastroesophageal reflux disease)   . Hypertension     no med now for 3-4 months  . Cancer 2015    prostate   . Prostate cancer 2005    gleason 7  . Skin cancer     ear  . Lung cancer   . Cancer December 04, 2014    Cranial     Past Surgical History  Procedure Laterality Date  . Ptca  2006  . Coronary stent placement    . Permanent  pacemaker insertion Bilateral 02/26/13    MDT Adapta L implanted by Dr Rayann Heman for sick sinus syndrome  . Radioactive seed implant  05/04/2004    8000 cGy, Dr Danny Lawless Dr Reece Agar  . Elbow surgery Bilateral 1973  . Coronary angioplasty    . Mediastinoscopy N/A 01/28/2014    Procedure: MEDIASTINOSCOPY;  Surgeon: Melrose Nakayama, MD;  Location: [facility];  Service: Thoracic;  Laterality: N/A;  . Permanent pacemaker insertion N/A 02/26/2013    Procedure: PERMANENT PACEMAKER INSERTION;  Surgeon: [name], MD;  Location: [facility];  Service: Cardiovascular;  Laterality: N/A;  . Insert / replace / remove pacemaker      not removed  . Craniotomy N/A 12/04/2014    Procedure: CRANIOTOMY TUMOR EXCISION, LEFT;  Surgeon: [name], MD;  Location: [facility];  Service: Neurosurgery;  Laterality: N/A;  CRANIOTOMY TUMOR EXCISION  . Application of cranial navigation  12/04/2014    Procedure: APPLICATION OF CRANIAL NAVIGATION;  Surgeon: [name], MD;  Location: [facility];  Service: Neurosurgery;;    There were no vitals filed for this visit.  Visit Diagnosis:  Lack of coordination  Weakness of right hand      Subjective Assessment - 02/25/15 1108    Subjective  My stomach is bothering me today   Limitations DVT RLE (on active treatment), RUE clots (on active treatment), *active CA with chemo treatments   Patient Stated Goals get my dominant hand working good   Currently in Pain? No/denies                      OT Treatments/Exercises (OP) - 02/25/15 0001    Exercises   Exercises Hand   Hand Exercises   Other Hand Exercises Gripper set at 25 lbs. resistance to pick up blocks for sustained grip strength Rt hand with min difficulty and drops.    Fine Motor Coordination   Fine Motor Coordination In hand manipuation training;Small Pegboard   In Hand Manipulation Training Attempted translating stress balls in Rt hand, but unable. Could do with Lt hand. Unable Rt hand  mostly d/t coordination deficits but also ? apraxia     Small Pegboard Pt copying small peg board design with Rt hand for coordination and visual/perceptual skills. Pt with mod difficulty and drops picking up pegs with Rt hand; also requires occasional assist from Lt hand. Pt also required min v.c's/questioning cues to copy design correctly and/or correct mistakes.    Other Fine Motor Exercises Reviewed coordination exercises issued 02/24/15. Added in hand manipulation ex today with coins but instructed to use nickels or quarters secondary to multiple drops attempting to use pennies.                 OT Education - 02/24/15 1301    Education provided Yes   Education Details Coordination, Red Putty HEP, use of tan foam for pen when writing  (see pt instructions)   Person(s) Educated Patient   Methods Explanation;Demonstration;Verbal cues;Handout   Comprehension Returned demonstration;Verbalized understanding;Verbal cues required;Need further instruction          OT Short Term Goals - 02/25/15 1143    OT SHORT TERM GOAL #1   Title Independent with coordination and putty HEP for Rt hand (due 03/14/15)   Time 4   Period Weeks   Status Achieved   OT SHORT TERM GOAL #2   Title Improve grip strength by 10 lbs or greater Rt hand    Baseline Rt = 30 (Lt = 62 lbs)   Time 4   Period Weeks   Status New   OT SHORT TERM GOAL #3   Title Improve coordination as evidenced by performing 9 hole peg test in 90 sec or less   Baseline 102.85 sec.    Time 4   Period Weeks   Status New   OT SHORT TERM GOAL #4   Title Pt to write name at 90% or greater legibility   Baseline 75%   Time 4   Period Weeks   Status On-going           OT Long Term Goals - 02/12/15 1522    OT LONG TERM GOAL #1   Title Independent w/ strengthening HEP for RUE - due 04/13/15   Time 8   Period Weeks   Status New   OT LONG TERM GOAL #2   Title Improve grip strength to 50 lbs or greater Rt hand   Baseline eval  = 30 lbs   Time 8   Period Weeks   Status New   OT LONG TERM GOAL #  3   Title Improve coordination as evidenced by performing 9 hole peg test in 75 sec. or less   Baseline eval = 102.85 sec.    Time 8   Period Weeks   Status New   OT LONG TERM GOAL #4   Title Pt to write check at 90% legibility or greater   Baseline eval = 75% name only   Time 8   Period Weeks   Status New   OT LONG TERM GOAL #5   Title Pt to prepare simple meal (stovetop) with distant supervision safely   Time 8   Period Weeks   Status New               Plan - 02/25/15 1143    Clinical Impression Statement Pt met STG #1 and approximating STG #4. Question motor apraxia present. Pt with decreased in hand manipulation present   OT Home Exercise Plan education issued:  02/24/15 coordination/red putty HEP; added coordination ex on 02/25/15.    Consulted and Agree with Plan of Care Patient;Family member/caregiver   Family Member Consulted fiancee (June)        Problem List Patient Active Problem List   Diagnosis Date Noted  . Hyperbilirubinemia 01/17/2015  . DVT (deep venous thrombosis) 01/17/2015  . Sensation of cold in leg 01/06/2015  . Pain of left leg 01/06/2015  . Metastasis to brain 12/04/2014  . Brain metastasis 11/17/2014  . Encounter for antineoplastic immunotherapy 11/17/2014  . Other pancytopenia   . Acute respiratory failure with hypoxia   . Antineoplastic chemotherapy induced pancytopenia   . SOB (shortness of breath) 07/21/2014  . CAP (community acquired pneumonia) 07/21/2014  . Neoplasm related pain 06/18/2014  . Anemia in neoplastic disease 06/04/2014  . Dyspnea 06/02/2014  . Acute right hip pain 05/26/2014  . Non-small cell carcinoma of lung, stage 4 01/30/2014  . Nodule of right lung 01/21/2014  . Mediastinal adenopathy 01/21/2014  . Prostate cancer   . History of radiation therapy   . 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 Sick sinus syndrome  04/16/2013  . Cardiac arrhythmia 07/12/2011  . COPD GOLD II 08/27/2010  . Abdominal aortic aneurysm 05/14/2008  . ACTINIC KERATOSIS, HEAD 02/11/2008  . LOSS, CONDUCTIVE HEARING, BILATERAL 01/18/2007  . HLD (hyperlipidemia) 12/25/2006  . Essential hypertension 12/25/2006  . Coronary atherosclerosis 12/25/2006    Carey Bullocks, OTR/L 02/25/2015, 11:46 AM  [facility] [address] [place], [place], [address] Phone: [phone]   Fax:  [phone]

## 2015-02-26 ENCOUNTER — Ambulatory Visit: Payer: Medicare Other

## 2015-02-26 DIAGNOSIS — R4701 Aphasia: Secondary | ICD-10-CM

## 2015-02-26 DIAGNOSIS — R29898 Other symptoms and signs involving the musculoskeletal system: Secondary | ICD-10-CM | POA: Diagnosis not present

## 2015-02-27 NOTE — Therapy (Signed)
[facility] [address] [place], [place], [address] Phone: [phone]   Fax:  [phone]  Speech Language Pathology Treatment  Patient Details  Name: Nicholas Schmitt MRN: [id number] Date of Birth: 07/08/1935 Referring Provider:  Dorena Cookey, MD  Encounter Date: 02/26/2015      End of Session - 02/27/15 0811    Visit Number 3   Number of Visits 16   Date for SLP Re-Evaluation 04/13/15   SLP Start Time 1104   SLP Stop Time  1146   SLP Time Calculation (min) 42 min   Activity Tolerance Patient tolerated treatment well      Past Medical History  Diagnosis Date  . CAD (coronary artery disease)     positive stress test in 2006 led to left heart cath (8/06) showing 95% prox RCA, 90% CFX, and 90% mLAD.  patient had a cypher DES to all 3 lesions  . Hyperlipidemia   . Hearing loss   . AAA (abdominal aortic aneurysm) 11/09    3.2 cm   . Chronic renal insufficiency   . History of radiation therapy 03/08/04- 04/08/04    prostate 4600 cGy in 3 fractions, radioactive seed implant 05/04/04  . Myocardial infarction 1995  . Pacemaker 2014  . Dysrhythmia   . Shortness of breath     exertion  . Asthma     bronchial  . COPD (chronic obstructive pulmonary disease)   . Radiation Jan.11-Jan 29/2016    Right central chest 35 gray in 14 fractions  . Radiation Jan.2016    35 gray in 14 fx lower lumbar/upper sacrum  . Diverticulosis   . Hemorrhoids   . Pneumonia     hx  . GERD (gastroesophageal reflux disease)   . Hypertension     no med now for 3-4 months  . Cancer 2015    prostate   . Prostate cancer 2005    gleason 7  . Skin cancer     ear  . Lung cancer   . Cancer December 04, 2014    Cranial     Past Surgical History  Procedure Laterality Date  . Ptca  2006  . Coronary stent placement    . Permanent pacemaker insertion Bilateral 02/26/13    MDT Adapta L implanted by Dr Rayann Heman for sick sinus syndrome  .  Radioactive seed implant  05/04/2004    8000 cGy, Dr Danny Lawless Dr Reece Agar  . Elbow surgery Bilateral 1973  . Coronary angioplasty    . Mediastinoscopy N/A 01/28/2014    Procedure: MEDIASTINOSCOPY;  Surgeon: Melrose Nakayama, MD;  Location: [facility];  Service: Thoracic;  Laterality: N/A;  . Permanent pacemaker insertion N/A 02/26/2013    Procedure: PERMANENT PACEMAKER INSERTION;  Surgeon: [name], MD;  Location: [facility];  Service: Cardiovascular;  Laterality: N/A;  . Insert / replace / remove pacemaker      not removed  . Craniotomy N/A 12/04/2014    Procedure: CRANIOTOMY TUMOR EXCISION, LEFT;  Surgeon: [name], MD;  Location: [facility];  Service: Neurosurgery;  Laterality: N/A;  CRANIOTOMY TUMOR EXCISION  . Application of cranial navigation  12/04/2014    Procedure: APPLICATION OF CRANIAL NAVIGATION;  Surgeon: [name], MD;  Location: [facility];  Service: Neurosurgery;;    There were no vitals filed for this visit.  Visit Diagnosis: Expressive aphasia  Receptive aphasia             ADULT SLP TREATMENT -  02/26/15 1106    General Information   Behavior/Cognition Alert;Cooperative;Pleasant mood   Treatment Provided   Treatment provided Cognitive-Linquistic   Pain Assessment   Pain Assessment 0-10   Pain Score 3    Pain Location rt calf   Pain Descriptors / Indicators Sore   Pain Intervention(s) Monitored during session   Cognitive-Linquistic Treatment   Treatment focused on Aphasia   Skilled Treatment (p) SLP facilitated pt's skills in verbal and written expression by divergent naming tasks and pt practicing address. He req'd mod A occasionally for 10 items in simple to mod complex category, and mod A usually with writing address (street name).   Assessment / Recommendations / Plan   Plan Continue with current plan of care   Progression Toward Goals   Progression toward goals (p) Progressing toward goals            SLP Short Term Goals -  02/26/15 1111    SLP SHORT TERM GOAL #1   Title (p) pt will complete simple naming tasks with 85% success, functionally   Time (p) 4   Period (p) Weeks   Status (p) On-going   SLP SHORT TERM GOAL #2   Title (p) pt will demo understanding of mod complex linguistic stimuli (directions with before/after, yes/no questions, etc) 75% with occasional min A   Time (p) 4   Period (p) Weeks   Status (p) On-going   SLP SHORT TERM GOAL #3   Title (p) pt will demo understanding of 3-4 sentence paragraphs (longer verbal stimuli) by answering yes/no or "wh" questions with 80% success and occasional min cues   Time (p) 4   Period (p) Weeks   Status (p) On-going   SLP SHORT TERM GOAL #4   Title (p) pt will write name and address without signs of aphasia   Time (p) 4   Period (p) Weeks   Status (p) On-going          SLP Long Term Goals - 02/25/15 1405    SLP LONG TERM GOAL #1   Title pt will demo understanding of functional 10 minutes simple to mod complex conversation with request for repeats allowed   Time 8   Period Weeks   Status On-going   SLP LONG TERM GOAL #2   Title pt will participate in 10 minutes simple conversation with compensations with rare min questioning cues   Time 8   Period Weeks   Status On-going   SLP LONG TERM GOAL #3   Title pt will write simple functional lists with rare min A for aphasic errors   Time 8   Period Weeks   Status On-going          Plan - 02/27/15 2671    Clinical Impression Statement Pt cont with receptive and expressive aphasia due to mets to brain and subsequent craniotomy. ST to cont to improve receptive and expressive language.   Speech Therapy Frequency 2x / week   Duration --  8 weeks   Treatment/Interventions Compensatory strategies;SLP instruction and feedback;Patient/family education;Functional tasks;Cueing hierarchy;Language facilitation   Potential to Achieve Goals Fair   Potential Considerations Co-morbidities         Problem List Patient Active Problem List   Diagnosis Date Noted  . Hyperbilirubinemia 01/17/2015  . DVT (deep venous thrombosis) 01/17/2015  . Sensation of cold in leg 01/06/2015  . Pain of left leg 01/06/2015  . Metastasis to brain 12/04/2014  . Brain metastasis 11/17/2014  . Encounter for  antineoplastic immunotherapy 11/17/2014  . Other pancytopenia   . Acute respiratory failure with hypoxia   . Antineoplastic chemotherapy induced pancytopenia   . SOB (shortness of breath) 07/21/2014  . CAP (community acquired pneumonia) 07/21/2014  . Neoplasm related pain 06/18/2014  . Anemia in neoplastic disease 06/04/2014  . Dyspnea 06/02/2014  . Acute right hip pain 05/26/2014  . Non-small cell carcinoma of lung, stage 4 01/30/2014  . Nodule of right lung 01/21/2014  . Mediastinal adenopathy 01/21/2014  . Prostate cancer   . History of radiation therapy   . 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 Sick sinus syndrome 04/16/2013  . Cardiac arrhythmia 07/12/2011  . COPD GOLD II 08/27/2010  . Abdominal aortic aneurysm 05/14/2008  . ACTINIC KERATOSIS, HEAD 02/11/2008  . LOSS, CONDUCTIVE HEARING, BILATERAL 01/18/2007  . HLD (hyperlipidemia) 12/25/2006  . Essential hypertension 12/25/2006  . Coronary atherosclerosis 12/25/2006    [facility] , [place], [place]  02/27/2015, 8:13 AM  [facility] [address] [place], [place], [address] Phone: [phone]   Fax:  [phone]

## 2015-02-27 NOTE — Patient Instructions (Signed)
  Please complete the assigned speech therapy homework and return it to your next session.  

## 2015-03-02 ENCOUNTER — Telehealth: Payer: Self-pay | Admitting: Medical Oncology

## 2015-03-02 ENCOUNTER — Encounter: Payer: Self-pay | Admitting: Internal Medicine

## 2015-03-02 ENCOUNTER — Ambulatory Visit (INDEPENDENT_AMBULATORY_CARE_PROVIDER_SITE_OTHER): Payer: Medicare Other | Admitting: Internal Medicine

## 2015-03-02 VITALS — BP 98/64 | HR 84 | Ht 72.0 in | Wt 177.8 lb

## 2015-03-02 DIAGNOSIS — I495 Sick sinus syndrome: Secondary | ICD-10-CM | POA: Diagnosis not present

## 2015-03-02 DIAGNOSIS — I10 Essential (primary) hypertension: Secondary | ICD-10-CM | POA: Diagnosis not present

## 2015-03-02 LAB — CUP PACEART INCLINIC DEVICE CHECK
Battery Impedance: 135 Ohm
Battery Remaining Longevity: 145 mo
Brady Statistic AP VP Percent: 1 %
Brady Statistic AS VP Percent: 3 %
Brady Statistic AS VS Percent: 77 %
Lead Channel Impedance Value: 562 Ohm
Lead Channel Pacing Threshold Amplitude: 0.75 V
Lead Channel Pacing Threshold Amplitude: 1 V
Lead Channel Pacing Threshold Pulse Width: 0.4 ms
Lead Channel Sensing Intrinsic Amplitude: 11.2 mV
Lead Channel Sensing Intrinsic Amplitude: 2.8 mV
Lead Channel Setting Pacing Amplitude: 2 V
Lead Channel Setting Pacing Pulse Width: 0.4 ms
MDC IDC MSMT BATTERY VOLTAGE: 2.8 V
MDC IDC MSMT LEADCHNL RA IMPEDANCE VALUE: 460 Ohm
MDC IDC MSMT LEADCHNL RV PACING THRESHOLD PULSEWIDTH: 0.4 ms
MDC IDC SESS DTM: 20160919135526
MDC IDC SET LEADCHNL RV PACING AMPLITUDE: 2.5 V
MDC IDC SET LEADCHNL RV SENSING SENSITIVITY: 4 mV
MDC IDC STAT BRADY AP VS PERCENT: 19 %

## 2015-03-02 NOTE — Patient Instructions (Signed)
Medication Instructions:  Your physician recommends that you continue on your current medications as directed. Please refer to the Current Medication list given to you today.   Labwork: None ordered  Testing/Procedures: None ordered  Follow-Up: Your physician wants you to follow-up in: 12 months with Dr Rayann Heman Dennis Bast will receive a reminder letter in the mail two months in advance. If you don't receive a letter, please call our office to schedule the follow-up appointment.  Remote monitoring is used to monitor your Pacemaker  from home. This monitoring reduces the number of office visits required to check your device to one time per year. It allows [place] to keep an eye on the functioning of your device to ensure it is working properly. You are scheduled for a device check from home on 06/01/15. You may send your transmission at any time that day. If you have a wireless device, the transmission will be sent automatically. After your physician reviews your transmission, you will receive a postcard with your next transmission date.    Any Other Special Instructions Will Be Listed Below (If Applicable).

## 2015-03-02 NOTE — Telephone Encounter (Signed)
Per Julien Nordmann I left a message for pt or June to contact his PCP regarding this problem.

## 2015-03-02 NOTE — Progress Notes (Signed)
PCP: [name], MD Primary Cardiologist:  Dr Particia Jasper is a 79 y.o. male who presents today for routine electrophysiology followup.   He continues to struggle with metastatic cancer.  He has had radiation and chemotherapy.  He is also s/p recent craniotomy.  He has chronic R arm/leg swelling for DVT.  Today, he denies symptoms of palpitations, chest pain, shortness of breath,  dizziness, presyncope, or syncope.  He has expressive aphasia. The patient is otherwise without complaint today.   Past Medical History  Diagnosis Date  . CAD (coronary artery disease)     positive stress test in 2006 led to left heart cath (8/06) showing 95% prox RCA, 90% CFX, and 90% mLAD.  patient had a cypher DES to all 3 lesions  . Hyperlipidemia   . Hearing loss   . AAA (abdominal aortic aneurysm) 11/09    3.2 cm   . Chronic renal insufficiency   . History of radiation therapy 03/08/04- 04/08/04    prostate 4600 cGy in 3 fractions, radioactive seed implant 05/04/04  . Myocardial infarction 1995  . Pacemaker 2014  . Shortness of breath     exertion  . COPD (chronic obstructive pulmonary disease)   . Radiation Jan.11-Jan 29/2016    Right central chest 35 gray in 14 fractions  . Radiation Jan.2016    35 gray in 14 fx lower lumbar/upper sacrum  . Diverticulosis   . Hemorrhoids   . Pneumonia     hx  . GERD (gastroesophageal reflux disease)   . Hypertension     no med now for 3-4 months  . Cancer 2015    prostate   . Prostate cancer 2005    gleason 7  . Skin cancer     ear  . Lung cancer   . Cancer December 04, 2014    Cranial    Past Surgical History  Procedure Laterality Date  . Ptca  2006  . Coronary stent placement    . Permanent pacemaker insertion Bilateral 02/26/13    MDT Adapta L implanted by Dr Rayann Heman for sick sinus syndrome  . Radioactive seed implant  05/04/2004    8000 cGy, Dr Danny Lawless Dr Reece Agar  . Elbow surgery Bilateral 1973  . Coronary angioplasty    .  Mediastinoscopy N/A 01/28/2014    Procedure: MEDIASTINOSCOPY;  Surgeon: Melrose Nakayama, MD;  Location: [facility];  Service: Thoracic;  Laterality: N/A;  . Permanent pacemaker insertion N/A 02/26/2013    Procedure: PERMANENT PACEMAKER INSERTION;  Surgeon: [name], MD;  Location: [facility];  Service: Cardiovascular;  Laterality: N/A;  . Insert / replace / remove pacemaker      not removed  . Craniotomy N/A 12/04/2014    Procedure: CRANIOTOMY TUMOR EXCISION, LEFT;  Surgeon: [name], MD;  Location: [facility];  Service: Neurosurgery;  Laterality: N/A;  CRANIOTOMY TUMOR EXCISION  . Application of cranial navigation  12/04/2014    Procedure: APPLICATION OF CRANIAL NAVIGATION;  Surgeon: [name], MD;  Location: [facility];  Service: Neurosurgery;;    Current Outpatient Prescriptions  Medication Sig Dispense Refill  . albuterol (PROVENTIL,VENTOLIN) 90 MCG/ACT inhaler Inhale 2 puffs into the lungs every 6 (six) hours as needed for wheezing or shortness of breath.    . budesonide-formoterol (SYMBICORT) 160-4.5 MCG/ACT inhaler Inhale 2 puffs into the lungs 2 (two) times daily. (Patient taking differently: Inhale 1 puff into the lungs 2 (two) times daily. )    . enoxaparin (  LOVENOX) 120 MG/0.8ML injection Inject 0.8 mLs (120 mg total) into the skin daily. 30 Syringe 1  . famotidine (PEPCID) 20 MG tablet Take 20 mg by mouth at bedtime.    . furosemide (LASIX) 20 MG tablet TAKE 1 TABLET (20 MG TOTAL) BY MOUTH DAILY. AS NEEDED FOR SWELLING. 30 tablet 0  . mirtazapine (REMERON) 30 MG tablet Take 1 tablet (30 mg total) by mouth at bedtime. 30 tablet 2  . Multiple Minerals (CALCIUM-MAGNESIUM-ZINC) TABS Take 1 tablet by mouth daily.    . Multiple Vitamin (MULTIVITAMIN WITH MINERALS) TABS tablet Take 1 tablet by mouth daily.    . nitroGLYCERIN (NITROSTAT) 0.4 MG SL tablet Place 0.4 mg under the tongue every 5 (five) minutes as needed for chest pain (MAX 3 TABLETS).    [name] omeprazole (PRILOSEC)  40 MG capsule Take 40 mg by mouth daily before supper.   0  . oxyCODONE (OXY IR/ROXICODONE) 5 MG immediate release tablet Take 1 tablet (5 mg total) by mouth every 6 (six) hours as needed for severe pain. 30 tablet 0  . prochlorperazine (COMPAZINE) 10 MG tablet Take 10 mg by mouth every 6 (six) hours as needed for nausea or vomiting.    . Sennosides (EX-LAX) 15 MG CHEW Chew 15 mg by mouth at bedtime.    . tamsulosin (FLOMAX) 0.4 MG CAPS capsule Take 0.4 mg by mouth at bedtime.   2   No current facility-administered medications for this visit.   Facility-Administered Medications Ordered in Other Visits  Medication Dose Route Frequency Provider Last Rate Last Dose  . heparin lock flush 100 unit/mL  500 Units Intracatheter Daily PRN Adrena E Johnson, PA-C      . sodium chloride 0.9 % injection 10 mL  10 mL Intracatheter PRN Adrena E Johnson, PA-C      . sodium chloride 0.9 % injection 3 mL  3 mL Intracatheter PRN Carlton Adam, PA-C        Physical Exam: Filed Vitals:   03/02/15 1056  BP: 98/64  Pulse: 84  Height: 6' (1.829 m)  Weight: 177 lb 12.8 oz (80.65 kg)    GEN- The patient is chronically ill appearing, alert and oriented x 3 today.   Head- normocephalic, atraumatic Eyes-  Sclera clear, conjunctiva pink Ears- hearing intact Oropharynx- clear Lungs- decreased BS at R base, normal work of breathing Chest- pacemaker pocket is well healed Heart- Regular rate and rhythm, no murmurs, rubs or gallops, PMI not laterally displaced GI- soft, NT, ND, + BS Extremities- no clubbing, cyanosis, + R arm/leg swelling (dependant) Neuro- expressive aphasia  Pacemaker interrogation- reviewed in detail today,  See PACEART report  Assessment and Plan:  1. Sick sinus syndrome- presyncope is resolved Normal pacemaker function See Pace Art report No changes today  2. Cad Stable No change required today  3. HTN Stable No change required today  We had a long discussion today about  end of life wishes.  He is clear that his preference is DNR/DNI  [facility] Return in 12 months to the EP clinic

## 2015-03-03 ENCOUNTER — Ambulatory Visit: Payer: Medicare Other | Admitting: Speech Pathology

## 2015-03-03 ENCOUNTER — Ambulatory Visit (INDEPENDENT_AMBULATORY_CARE_PROVIDER_SITE_OTHER): Payer: Medicare Other | Admitting: Family Medicine

## 2015-03-03 ENCOUNTER — Ambulatory Visit: Payer: Medicare Other | Admitting: Physical Therapy

## 2015-03-03 ENCOUNTER — Ambulatory Visit: Payer: Medicare Other | Admitting: Occupational Therapy

## 2015-03-03 VITALS — BP 100/68 | Temp 98.1°F | Wt 176.0 lb

## 2015-03-03 DIAGNOSIS — I10 Essential (primary) hypertension: Secondary | ICD-10-CM

## 2015-03-03 DIAGNOSIS — C61 Malignant neoplasm of prostate: Secondary | ICD-10-CM

## 2015-03-03 DIAGNOSIS — C3491 Malignant neoplasm of unspecified part of right bronchus or lung: Secondary | ICD-10-CM

## 2015-03-03 NOTE — Progress Notes (Signed)
Pre visit review using our clinic review tool, if applicable. No additional management support is needed unless otherwise documented below in the visit note. 

## 2015-03-03 NOTE — Patient Instructions (Signed)
Continue to follow the directions of your oncologist.

## 2015-03-03 NOTE — Progress Notes (Signed)
   Subjective:    Patient ID: Nicholas Schmitt, male    DOB: 07/05/1935, 79 y.o.   MRN: [id number]  HPI Nicholas Schmitt is a 79 year old male who comes in today accompanied by his male friend....... for evaluation of multiple issues  He says he is having trouble with his legs and arm swelling. This is been a chronic problem since [place] had chemotherapy and cancer. He was given Lasix 20 mg daily to take when necessary by his oncologist Dr. Earlie Server. He also complains of difficulty swallowing since she had radiation therapy. He is able to eat is maintain his nutrition.  I'm not sure exactly why he is here. He said he called the oncology office about these issues was told to come and see [place] by Dr. Lew Dawes nurse. Indeed these are chronic not acute problems.   Review of Systems    no history of trauma or infection Objective:   Physical Exam Well-developed well-nourished elderly male no acute distress he has some expressive aphasia previous CNS tumor excision  Examination lower extremity shows some 1+ edema little worse on the right than the left he also has some swelling of his arm.  Recent lab shows and low albumin and hemoglobin of 9.1       Assessment & Plan:  Chronic swelling of the lower extremity and right arm secondary to underlying malignancy hyperproteinemia etc..............[name] recommend he continue to follow Dr. Lew Dawes instructions  Difficulty swallowing........ secondary to radiation...... actually his swallowing has improved over the last month.

## 2015-03-05 ENCOUNTER — Ambulatory Visit: Payer: Medicare Other | Admitting: Physical Therapy

## 2015-03-05 ENCOUNTER — Ambulatory Visit: Payer: Medicare Other

## 2015-03-05 ENCOUNTER — Ambulatory Visit: Payer: Medicare Other | Admitting: Occupational Therapy

## 2015-03-05 DIAGNOSIS — R6889 Other general symptoms and signs: Secondary | ICD-10-CM

## 2015-03-05 DIAGNOSIS — R269 Unspecified abnormalities of gait and mobility: Secondary | ICD-10-CM

## 2015-03-05 DIAGNOSIS — M6281 Muscle weakness (generalized): Secondary | ICD-10-CM

## 2015-03-05 DIAGNOSIS — R4701 Aphasia: Secondary | ICD-10-CM

## 2015-03-05 DIAGNOSIS — R29898 Other symptoms and signs involving the musculoskeletal system: Secondary | ICD-10-CM

## 2015-03-05 DIAGNOSIS — R279 Unspecified lack of coordination: Secondary | ICD-10-CM

## 2015-03-05 NOTE — Therapy (Signed)
[facility] [address] [place], [place], [address] Phone: [phone]   Fax:  [phone]  Speech Language Pathology Treatment  Patient Details  Name: Nicholas Schmitt MRN: [id number] Date of Birth: [birth year] Referring Provider:  Dorena Cookey, MD  Encounter Date: 03/05/2015      End of Session - 03/05/15 1537    Visit Number 4   Number of Visits 16   Date for SLP Re-Evaluation 04/13/15   SLP Start Time 1446   SLP Stop Time  1950   SLP Time Calculation (min) 44 min   Activity Tolerance Patient tolerated treatment well      Past Medical History  Diagnosis Date  . CAD (coronary artery disease)     positive stress test in 2006 led to left heart cath (8/06) showing 95% prox RCA, 90% CFX, and 90% mLAD.  patient had a cypher DES to all 3 lesions  . Hyperlipidemia   . Hearing loss   . AAA (abdominal aortic aneurysm) 11/09    3.2 cm   . Chronic renal insufficiency   . History of radiation therapy 03/08/04- 04/08/04    prostate 4600 cGy in 3 fractions, radioactive seed implant 05/04/04  . Myocardial infarction 1995  . Pacemaker 2014  . Shortness of breath     exertion  . COPD (chronic obstructive pulmonary disease)   . Radiation Jan.11-Jan 29/2016    Right central chest 35 gray in 14 fractions  . Radiation Jan.2016    35 gray in 14 fx lower lumbar/upper sacrum  . Diverticulosis   . Hemorrhoids   . Pneumonia     hx  . GERD (gastroesophageal reflux disease)   . Hypertension     no med now for 3-4 months  . Cancer 2015    prostate   . Prostate cancer 2005    gleason 7  . Skin cancer     ear  . Lung cancer   . Cancer December 04, 2014    Cranial     Past Surgical History  Procedure Laterality Date  . Ptca  2006  . Coronary stent placement    . Permanent pacemaker insertion Bilateral 02/26/13    MDT Adapta L implanted by Dr Rayann Heman for sick sinus syndrome  . Radioactive seed implant  05/04/2004     8000 cGy, Dr Danny Lawless Dr Reece Agar  . Elbow surgery Bilateral 1973  . Coronary angioplasty    . Mediastinoscopy N/A 01/28/2014    Procedure: MEDIASTINOSCOPY;  Surgeon: Melrose Nakayama, MD;  Location: [facility];  Service: Thoracic;  Laterality: N/A;  . Permanent pacemaker insertion N/A 02/26/2013    Procedure: PERMANENT PACEMAKER INSERTION;  Surgeon: [name], MD;  Location: [facility];  Service: Cardiovascular;  Laterality: N/A;  . Insert / replace / remove pacemaker      not removed  . Craniotomy N/A 12/04/2014    Procedure: CRANIOTOMY TUMOR EXCISION, LEFT;  Surgeon: [name], MD;  Location: [facility];  Service: Neurosurgery;  Laterality: N/A;  CRANIOTOMY TUMOR EXCISION  . Application of cranial navigation  12/04/2014    Procedure: APPLICATION OF CRANIAL NAVIGATION;  Surgeon: [name], MD;  Location: [facility];  Service: Neurosurgery;;    There were no vitals filed for this visit.  Visit Diagnosis: Expressive aphasia  Receptive aphasia      Subjective Assessment - 03/05/15 1444    Subjective "Wore me out a little." (pt, re: PT)  ADULT SLP TREATMENT - 03/05/15 1447    General Information   Behavior/Cognition Alert;Cooperative;Pleasant mood   Treatment Provided   Treatment provided Cognitive-Linquistic   Pain Assessment   Pain Assessment 0-10   Pain Score 5    Pain Location rt leg   Pain Descriptors / Indicators Sore   Pain Intervention(s) Monitored during session   Cognitive-Linquistic Treatment   Treatment focused on Aphasia   Skilled Treatment Pt named average 4 items in simple, salient category prior to requiring mod-max cues. He named locations with 80% functionally. Pt demonstrated understanding of unrelated questions with 80% success. He completed sentences (semi-rote, e.g., "She baked a ...", "They rode in the...") with 85% success; 93% with revisions. In true sentence completions, pt was 35% successful, and 40% with revisions.  Jargon speech often noted as well as phonemic paraphasias were prevalent with pt awareness approx 90% of the time.    Assessment / Recommendations / Plan   Plan Continue with current plan of care   Progression Toward Goals   Progression toward goals Progressing toward goals            SLP Short Term Goals - 03/05/15 1504    SLP SHORT TERM GOAL #1   Title pt will complete simple naming tasks with 85% success, functionally   Time 3   Period Weeks   Status On-going   SLP SHORT TERM GOAL #2   Title pt will demo understanding of mod complex linguistic stimuli (directions with before/after, yes/no questions, etc) 75% with occasional min A   Time 3   Period Weeks   Status On-going   SLP SHORT TERM GOAL #3   Title pt will demo understanding of 3-4 sentence paragraphs (longer verbal stimuli) by answering yes/no or "wh" questions with 80% success and occasional min cues   Time 3   Period Weeks   Status On-going   SLP SHORT TERM GOAL #4   Title pt will write name and address without signs of aphasia   Time 3   Period Weeks   Status On-going          SLP Long Term Goals - 03/05/15 1538    SLP LONG TERM GOAL #1   Title pt will demo understanding of functional 10 minutes simple to mod complex conversation with request for repeats allowed   Time 7   Period Weeks   Status On-going   SLP LONG TERM GOAL #2   Title pt will participate in 10 minutes simple conversation with compensations with rare min questioning cues   Time 7   Period Weeks   Status On-going   SLP LONG TERM GOAL #3   Title pt will write simple functional lists with rare min A for aphasic errors   Time 7   Period Weeks   Status On-going          Plan - 03/05/15 1538    Clinical Impression Statement Pt cont with receptive and expressive aphasia due to mets to brain and subsequent craniotomy. ST to cont to improve receptive and expressive language.   Speech Therapy Frequency 2x / week   Duration --  7 weeks    Treatment/Interventions Compensatory strategies;SLP instruction and feedback;Patient/family education;Functional tasks;Cueing hierarchy;Language facilitation   Potential to Achieve Goals Fair   Potential Considerations Co-morbidities        Problem List Patient Active Problem List   Diagnosis Date Noted  . Hyperbilirubinemia 01/17/2015  . DVT (deep venous thrombosis) 01/17/2015  . Sensation of  cold in leg 01/06/2015  . Pain of left leg 01/06/2015  . Metastasis to brain 12/04/2014  . Brain metastasis 11/17/2014  . Encounter for antineoplastic immunotherapy 11/17/2014  . Other pancytopenia   . Acute respiratory failure with hypoxia   . Antineoplastic chemotherapy induced pancytopenia   . SOB (shortness of breath) 07/21/2014  . CAP (community acquired pneumonia) 07/21/2014  . Neoplasm related pain 06/18/2014  . Anemia in neoplastic disease 06/04/2014  . Dyspnea 06/02/2014  . Acute right hip pain 05/26/2014  . Non-small cell carcinoma of lung, stage 4 01/30/2014  . Nodule of right lung 01/21/2014  . Mediastinal adenopathy 01/21/2014  . Prostate cancer   . History of radiation therapy   . 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 Sick sinus syndrome 04/16/2013  . Cardiac arrhythmia 07/12/2011  . COPD GOLD II 08/27/2010  . Abdominal aortic aneurysm 05/14/2008  . ACTINIC KERATOSIS, HEAD 02/11/2008  . LOSS, CONDUCTIVE HEARING, BILATERAL 01/18/2007  . HLD (hyperlipidemia) 12/25/2006  . Essential hypertension 12/25/2006  . Coronary atherosclerosis 12/25/2006    [facility] , [place], [place]  03/05/2015, 3:39 PM  [facility] [address] [place], [place], [address] Phone: [phone]   Fax:  [phone]

## 2015-03-05 NOTE — Patient Instructions (Signed)
"  I love a Psychiatrist for support.  March in place alternating legs raising knee as high as you can.  Repeat 10 times 2 times a day.  Copyright  VHI. All rights reserved.   HIP: Abduction - Standing (Weight)   Stand facing counter or chair for support.. Raise leg out to the side keeping toes pointing straight.  Alternate legs.  Repeat 10 times 2 times a day.   Copyright  VHI. All rights reserved.  Hip Extension (Standing)   Stand with support. Squeeze pelvic floor and hold. Move right leg backward with straight knee. Alternate legs.   Repeat 10 times. Do 2 times a day.   Copyright  VHI. All rights reserved.  Hamstring Strengthening   Holding support, lift right heel toward buttocks. Alternate legs and repeat 10 times on each leg. Do 2 sessions per day.  http://gt2.exer.us/281   Copyright  VHI. All rights reserved.    Toe Up   Using support, gently rise up on toes and rock back on heels. Repeat 10 times. Do 2 sessions per day.  http://gt2.exer.us/509   Copyright  VHI. All rights reserved.  High Step - Seated   Sit with back straight, feet flat.  March legs alternating sides.  Repeat 10 times 2 times a day.  Copyright  VHI. All rights reserved.   KNEE: Extension, Long Arc Quads - Sitting   Raise leg until knee is straight. Repeat 10 times on each leg 2 times a day.  Copyright  VHI. All rights reserved.

## 2015-03-05 NOTE — Therapy (Signed)
[facility] [address] [place], [place], [address] Phone: [phone]   Fax:  [phone]  Physical Therapy Treatment  Patient Details  Name: Nicholas Schmitt MRN: [id number] Date of Birth: 1935-10-23 Referring Provider:  Dorena Cookey, MD  Encounter Date: 03/05/2015      PT End of Session - 03/05/15 1452    Visit Number 4   Number of Visits 17   Date for PT Re-Evaluation 04/14/15   Authorization Type BCBS Medicare-G-code required every 10th visit   PT Start Time 1404   PT Stop Time 1446   PT Time Calculation (min) 42 min   Equipment Utilized During Treatment Gait belt   Activity Tolerance Patient tolerated treatment well   Behavior During Therapy [facility] for tasks assessed/performed      Past Medical History  Diagnosis Date  . CAD (coronary artery disease)     positive stress test in 2006 led to left heart cath (8/06) showing 95% prox RCA, 90% CFX, and 90% mLAD.  patient had a cypher DES to all 3 lesions  . Hyperlipidemia   . Hearing loss   . AAA (abdominal aortic aneurysm) 11/09    3.2 cm   . Chronic renal insufficiency   . History of radiation therapy 03/08/04- 04/08/04    prostate 4600 cGy in 3 fractions, radioactive seed implant 05/04/04  . Myocardial infarction 1995  . Pacemaker 2014  . Shortness of breath     exertion  . COPD (chronic obstructive pulmonary disease)   . Radiation Jan.11-Jan 29/2016    Right central chest 35 gray in 14 fractions  . Radiation Jan.2016    35 gray in 14 fx lower lumbar/upper sacrum  . Diverticulosis   . Hemorrhoids   . Pneumonia     hx  . GERD (gastroesophageal reflux disease)   . Hypertension     no med now for 3-4 months  . Cancer 2015    prostate   . Prostate cancer 2005    gleason 7  . Skin cancer     ear  . Lung cancer   . Cancer December 04, 2014    Cranial     Past Surgical History  Procedure Laterality Date  . Ptca  2006  . Coronary stent placement     . Permanent pacemaker insertion Bilateral 02/26/13    MDT Adapta L implanted by Dr Rayann Heman for sick sinus syndrome  . Radioactive seed implant  05/04/2004    8000 cGy, Dr Danny Lawless Dr Reece Agar  . Elbow surgery Bilateral 1973  . Coronary angioplasty    . Mediastinoscopy N/A 01/28/2014    Procedure: MEDIASTINOSCOPY;  Surgeon: Melrose Nakayama, MD;  Location: [facility];  Service: Thoracic;  Laterality: N/A;  . Permanent pacemaker insertion N/A 02/26/2013    Procedure: PERMANENT PACEMAKER INSERTION;  Surgeon: [name], MD;  Location: [facility];  Service: Cardiovascular;  Laterality: N/A;  . Insert / replace / remove pacemaker      not removed  . Craniotomy N/A 12/04/2014    Procedure: CRANIOTOMY TUMOR EXCISION, LEFT;  Surgeon: [name], MD;  Location: [facility];  Service: Neurosurgery;  Laterality: N/A;  CRANIOTOMY TUMOR EXCISION  . Application of cranial navigation  12/04/2014    Procedure: APPLICATION OF CRANIAL NAVIGATION;  Surgeon: [name], MD;  Location: [facility];  Service: Neurosurgery;;    There were no vitals filed for this visit.  Visit Diagnosis:  Abnormality of gait  Muscle  weakness of lower extremity      Subjective Assessment - 03/05/15 1411    Subjective R leg pain is waking him up at night.  Went to PCP but he did not change anything per pt.   Patient Stated Goals Pt wants to get help with talking and with R arm.  Pt in agreement with balance assessment and walking.   Currently in Pain? Yes   Pain Score 5    Pain Location Leg  entire LLE including foot and hip   Pain Orientation Right   Pain Descriptors / Indicators Aching   Pain Type Chronic pain   Pain Onset More than a month ago   Pain Frequency Constant   Aggravating Factors  unsure   Pain Relieving Factors unsure   Effect of Pain on Daily Activities difficulty sleeping, awakens him at night                         [facility] Adult PT Treatment/Exercise - 03/05/15 1450     Transfers   Transfers Sit to Stand;Stand to Sit   Sit to Stand 5: Supervision   Stand to Sit 5: Supervision   Ambulation/Gait   Ambulation/Gait Yes   Ambulation/Gait Assistance 4: Min guard   Ambulation Distance (Feet) 120 Feet  x 2 and 180 x 2   Assistive device Straight cane   Gait Pattern Step-through pattern;Decreased arm swing - right;Decreased arm swing - left;Decreased weight shift to right;Lateral trunk lean to right;Trendelenburg;Wide base of support   Ambulation Surface Level;Indoor   Gait Comments DOE 4/4 after ambulation but SaO2 >93% through out session;EHR 105-110       Therapeutic Exercise-See HEP for exercises performed during session and provided as HEP.         PT Education - 03/05/15 1452    Education provided Yes   Education Details HEP   Person(s) Educated Patient   Methods Explanation;Demonstration;Verbal cues;Handout   Comprehension Verbalized understanding;Need further instruction          PT Short Term Goals - 02/12/15 1417    PT SHORT TERM GOAL #1   Title Pt will perform HEP with family supervision for improved strength, balance, mobility.  Target 03/14/15   Time 4   Period Weeks   Status New   PT SHORT TERM GOAL #2   Title Pt will improve Berg Balance score to at least 37/56 for decreased fall risk.   Baseline 32/56 at eval   Time 4   Period Weeks   Status New   PT SHORT TERM GOAL #3   Title Pt will improve TUG score to less than or equal to 13.5 seconds for decreased fall risk.   Baseline 13.6 sec at eval   Time 4   Period Weeks   Status New   PT SHORT TERM GOAL #4   Title Pt will ambulate at least 200 ft. using least restrictive assistive device (cane vs. RW) modified independently, for improved functional mobility.   Time 4   Period Weeks   Status New           PT Long Term Goals - 02/12/15 1419    PT LONG TERM GOAL #1   Title Pt/family will verbalize understanding of fall prevention within home environment.  TARGET  04/14/15   Time 8   Period Weeks   Status New   PT LONG TERM GOAL #2   Title Pt will improve Berg Balance score to at least  42/56 for decreased fall risk.   Time 8   Period Weeks   Status New   PT LONG TERM GOAL #3   Title Pt will improve gait velocity to at least 2.62 ft/sec for improved gait efficiency and safety.   Time 8   Period Weeks   Status New   PT LONG TERM GOAL #4   Title Pt will ambulate at least 500 ft, indoor and outdoor surfaces, modified independently, for improved functional mobility.   Time 8   Period Weeks   Status New   PT LONG TERM GOAL #5   Title Pt will verbalize plans for continued community fitness upon D/C from PT.   Time 8   Status New               Plan - 03/05/15 1453    Clinical Impression Statement Pt needing frequent rest breaks during session and with DOE.  Continues with decreased endurance and pain in RLE since DVT along with edema.  Continue PT per POC.   Pt will benefit from skilled therapeutic intervention in order to improve on the following deficits Abnormal gait;Decreased activity tolerance;Decreased balance;Decreased mobility;Decreased safety awareness;Decreased strength;Difficulty walking   Rehab Potential Good   PT Frequency 2x / week   PT Duration 8 weeks   PT Treatment/Interventions ADLs/Self Care Home Management;Therapeutic exercise;Therapeutic activities;Functional mobility training;Gait training;Patient/family education;DME Instruction;Neuromuscular re-education;Balance training   PT Next Visit Plan Review HEP and revise/add as needed.  Establish walking program for home.   Consulted and Agree with Plan of Care Patient        Problem List Patient Active Problem List   Diagnosis Date Noted  . Hyperbilirubinemia 01/17/2015  . DVT (deep venous thrombosis) 01/17/2015  . Sensation of cold in leg 01/06/2015  . Pain of left leg 01/06/2015  . Metastasis to brain 12/04/2014  . Brain metastasis 11/17/2014  . Encounter for  antineoplastic immunotherapy 11/17/2014  . Other pancytopenia   . Acute respiratory failure with hypoxia   . Antineoplastic chemotherapy induced pancytopenia   . SOB (shortness of breath) 07/21/2014  . CAP (community acquired pneumonia) 07/21/2014  . Neoplasm related pain 06/18/2014  . Anemia in neoplastic disease 06/04/2014  . Dyspnea 06/02/2014  . Acute right hip pain 05/26/2014  . Non-small cell carcinoma of lung, stage 4 01/30/2014  . Nodule of right lung 01/21/2014  . Mediastinal adenopathy 01/21/2014  . Prostate cancer   . History of radiation therapy   . 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 Sick sinus syndrome 04/16/2013  . Cardiac arrhythmia 07/12/2011  . COPD GOLD II 08/27/2010  . Abdominal aortic aneurysm 05/14/2008  . ACTINIC KERATOSIS, HEAD 02/11/2008  . LOSS, CONDUCTIVE HEARING, BILATERAL 01/18/2007  . HLD (hyperlipidemia) 12/25/2006  . Essential hypertension 12/25/2006  . Coronary atherosclerosis 12/25/2006    Narda Bonds 03/05/2015, 2:56 PM  [facility] [address] [place], [place], [address] Phone: [phone]   Fax:  [place], [place] 03/05/2015 2:56 PM Phone: [phone] Fax: [phone]

## 2015-03-05 NOTE — Therapy (Signed)
[facility] [address] [place], [place], [address] Phone: [phone]   Fax:  [phone]  Occupational Therapy Treatment  Patient Details  Name: Nicholas Schmitt MRN: [id number] Date of Birth: Feb 04, 1936 Referring Provider:  Dorena Cookey, MD  Encounter Date: 03/05/2015      OT End of Session - 03/05/15 1619    Visit Number 4   Number of Visits 17   Date for OT Re-Evaluation 04/13/15   Authorization Type Blue MCR - G code   Authorization - Visit Number 4   Authorization - Number of Visits 10   OT Start Time 6962   OT Stop Time 1610   OT Time Calculation (min) 40 min   Activity Tolerance Patient tolerated treatment well      Past Medical History  Diagnosis Date  . CAD (coronary artery disease)     positive stress test in 2006 led to left heart cath (8/06) showing 95% prox RCA, 90% CFX, and 90% mLAD.  patient had a cypher DES to all 3 lesions  . Hyperlipidemia   . Hearing loss   . AAA (abdominal aortic aneurysm) 11/09    3.2 cm   . Chronic renal insufficiency   . History of radiation therapy 03/08/04- 04/08/04    prostate 4600 cGy in 3 fractions, radioactive seed implant 05/04/04  . Myocardial infarction 1995  . Pacemaker 2014  . Shortness of breath     exertion  . COPD (chronic obstructive pulmonary disease)   . Radiation Jan.11-Jan 29/2016    Right central chest 35 gray in 14 fractions  . Radiation Jan.2016    35 gray in 14 fx lower lumbar/upper sacrum  . Diverticulosis   . Hemorrhoids   . Pneumonia     hx  . GERD (gastroesophageal reflux disease)   . Hypertension     no med now for 3-4 months  . Cancer 2015    prostate   . Prostate cancer 2005    gleason 7  . Skin cancer     ear  . Lung cancer   . Cancer December 04, 2014    Cranial     Past Surgical History  Procedure Laterality Date  . Ptca  2006  . Coronary stent placement    . Permanent pacemaker insertion Bilateral 02/26/13     MDT Adapta L implanted by Dr Rayann Heman for sick sinus syndrome  . Radioactive seed implant  05/04/2004    8000 cGy, Dr Danny Lawless Dr Reece Agar  . Elbow surgery Bilateral 1973  . Coronary angioplasty    . Mediastinoscopy N/A 01/28/2014    Procedure: MEDIASTINOSCOPY;  Surgeon: Melrose Nakayama, MD;  Location: [facility];  Service: Thoracic;  Laterality: N/A;  . Permanent pacemaker insertion N/A 02/26/2013    Procedure: PERMANENT PACEMAKER INSERTION;  Surgeon: [name], MD;  Location: [facility];  Service: Cardiovascular;  Laterality: N/A;  . Insert / replace / remove pacemaker      not removed  . Craniotomy N/A 12/04/2014    Procedure: CRANIOTOMY TUMOR EXCISION, LEFT;  Surgeon: [name], MD;  Location: [facility];  Service: Neurosurgery;  Laterality: N/A;  CRANIOTOMY TUMOR EXCISION  . Application of cranial navigation  12/04/2014    Procedure: APPLICATION OF CRANIAL NAVIGATION;  Surgeon: [name], MD;  Location: [facility];  Service: Neurosurgery;;    There were no vitals filed for this visit.  Visit Diagnosis:  Decreased functional activity tolerance  Weakness of right  arm  Lack of coordination      Subjective Assessment - 03/05/15 1531    Limitations DVT RLE (on active treatment), RUE clots (on active treatment), *active CA with chemo treatments   Patient Stated Goals get my dominant hand working good   Currently in Pain? Yes  RLE - SEE P.T. (O.T. not addressing)                      OT Treatments/Exercises (OP) - 03/05/15 1535    Exercises   Exercises Shoulder   Shoulder Exercises: ROM/Strengthening   UBE (Upper Arm Bike) UBE x 8 min. Level 3 for strength/endurance with 1 rest break required   Other ROM/Strengthening Exercises Issued theraband HEP for BUE's - see pt instructions. Emphasis on RUE secondary to increased weakness on RUE   Fine Motor Coordination   Small Pegboard Pt placing key shaped pegs in pegboard with Rt hand with min drops, mod to  max difficulty and using Lt hand to assist turn peg in hand for placement. Pt then removing pegs with tweezers with min difficulty                OT Education - 03/05/15 1555    Education provided Yes   Education Details Theraband HEP (BUE's)   Person(s) Educated Patient   Methods Explanation;Demonstration;Handout   Comprehension Verbalized understanding;Returned demonstration          OT Short Term Goals - 02/25/15 1143    OT SHORT TERM GOAL #1   Title Independent with coordination and putty HEP for Rt hand (due 03/14/15)   Time 4   Period Weeks   Status Achieved   OT SHORT TERM GOAL #2   Title Improve grip strength by 10 lbs or greater Rt hand    Baseline Rt = 30 (Lt = 62 lbs)   Time 4   Period Weeks   Status New   OT SHORT TERM GOAL #3   Title Improve coordination as evidenced by performing 9 hole peg test in 90 sec or less   Baseline 102.85 sec.    Time 4   Period Weeks   Status New   OT SHORT TERM GOAL #4   Title Pt to write name at 90% or greater legibility   Baseline 75%   Time 4   Period Weeks   Status On-going           OT Long Term Goals - 02/12/15 1522    OT LONG TERM GOAL #1   Title Independent w/ strengthening HEP for RUE - due 04/13/15   Time 8   Period Weeks   Status New   OT LONG TERM GOAL #2   Title Improve grip strength to 50 lbs or greater Rt hand   Baseline eval = 30 lbs   Time 8   Period Weeks   Status New   OT LONG TERM GOAL #3   Title Improve coordination as evidenced by performing 9 hole peg test in 75 sec. or less   Baseline eval = 102.85 sec.    Time 8   Period Weeks   Status New   OT LONG TERM GOAL #4   Title Pt to write check at 90% legibility or greater   Baseline eval = 75% name only   Time 8   Period Weeks   Status New   OT LONG TERM GOAL #5   Title Pt to prepare simple meal (stovetop) with distant supervision safely  Time 8   Period Weeks   Status New               Plan - 03/05/15 1620     Clinical Impression Statement Pt progressing as able. Medical status limitiing endurance and regular attendance   OT Frequency 2x / week   Plan continue coordination, grip strength, writing   OT Home Exercise Plan education issued:  02/24/15 coordination/red putty HEP; added coordination ex on 02/25/15. Theraband HEP on 03/05/15   Consulted and Agree with Plan of Care Patient        Problem List Patient Active Problem List   Diagnosis Date Noted  . Hyperbilirubinemia 01/17/2015  . DVT (deep venous thrombosis) 01/17/2015  . Sensation of cold in leg 01/06/2015  . Pain of left leg 01/06/2015  . Metastasis to brain 12/04/2014  . Brain metastasis 11/17/2014  . Encounter for antineoplastic immunotherapy 11/17/2014  . Other pancytopenia   . Acute respiratory failure with hypoxia   . Antineoplastic chemotherapy induced pancytopenia   . SOB (shortness of breath) 07/21/2014  . CAP (community acquired pneumonia) 07/21/2014  . Neoplasm related pain 06/18/2014  . Anemia in neoplastic disease 06/04/2014  . Dyspnea 06/02/2014  . Acute right hip pain 05/26/2014  . Non-small cell carcinoma of lung, stage 4 01/30/2014  . Nodule of right lung 01/21/2014  . Mediastinal adenopathy 01/21/2014  . Prostate cancer   . History of radiation therapy   . 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 Sick sinus syndrome 04/16/2013  . Cardiac arrhythmia 07/12/2011  . COPD GOLD II 08/27/2010  . Abdominal aortic aneurysm 05/14/2008  . ACTINIC KERATOSIS, HEAD 02/11/2008  . LOSS, CONDUCTIVE HEARING, BILATERAL 01/18/2007  . HLD (hyperlipidemia) 12/25/2006  . Essential hypertension 12/25/2006  . Coronary atherosclerosis 12/25/2006    Carey Bullocks, OTR/L 03/05/2015, 4:23 PM  [facility] [address] [place], [place], [address] Phone: [phone]   Fax:  [phone]

## 2015-03-05 NOTE — Patient Instructions (Signed)
  Please complete the assigned speech therapy homework and return it to your next session.  

## 2015-03-05 NOTE — Patient Instructions (Signed)
  Strengthening: Resisted Flexion   Hold tubing with ___Rt, then Lt__ arm(s) at side. Pull forward and up. Move shoulder through pain-free range of motion. Repeat __10__ times per set.  Do _1-2_ sessions per day , every other day   Strengthening: Resisted Extension   Hold tubing in _Rt, then Lt____ hand(s), arm forward. Pull arm back, elbow straight. Repeat _10___ times per set. Do _1-2___ sessions per day, every other day.   Resisted Horizontal Abduction: Bilateral   Sit or stand, tubing in both hands, arms out in front. Keeping arms straight, pinch shoulder blades together and stretch arms out. Repeat _10___ times per set. Do _1-2___ sessions per day, every other day.   Elbow Flexion: Resisted   With tubing held in _Rt, then Lt___ hand(s) and other end secured under foot, curl arm up as far as possible. Repeat _10___ times per set. Do _1-2___ sessions per day, every other day.    Elbow Extension: Resisted   Sit in chair with resistive band secured at armrest (or hold with other hand at shoulder level) and ___Rt, then Lt____ elbow bent. Straighten elbow. Repeat _10___ times per set.  Do _1-2___ sessions per day, every other day.

## 2015-03-10 ENCOUNTER — Telehealth: Payer: Self-pay | Admitting: Physician Assistant

## 2015-03-10 ENCOUNTER — Other Ambulatory Visit: Payer: Medicare Other

## 2015-03-10 ENCOUNTER — Encounter: Payer: Self-pay | Admitting: Physician Assistant

## 2015-03-10 ENCOUNTER — Ambulatory Visit (HOSPITAL_BASED_OUTPATIENT_CLINIC_OR_DEPARTMENT_OTHER): Payer: Medicare Other

## 2015-03-10 ENCOUNTER — Other Ambulatory Visit (HOSPITAL_BASED_OUTPATIENT_CLINIC_OR_DEPARTMENT_OTHER): Payer: Medicare Other

## 2015-03-10 ENCOUNTER — Ambulatory Visit (HOSPITAL_BASED_OUTPATIENT_CLINIC_OR_DEPARTMENT_OTHER): Payer: Medicare Other | Admitting: Physician Assistant

## 2015-03-10 ENCOUNTER — Ambulatory Visit: Payer: Medicare Other | Admitting: Nutrition

## 2015-03-10 VITALS — BP 125/69 | HR 95 | Temp 98.4°F | Resp 19 | Ht 72.0 in | Wt 172.2 lb

## 2015-03-10 DIAGNOSIS — C3490 Malignant neoplasm of unspecified part of unspecified bronchus or lung: Secondary | ICD-10-CM | POA: Diagnosis not present

## 2015-03-10 DIAGNOSIS — Z5112 Encounter for antineoplastic immunotherapy: Secondary | ICD-10-CM | POA: Diagnosis not present

## 2015-03-10 DIAGNOSIS — C3491 Malignant neoplasm of unspecified part of right bronchus or lung: Secondary | ICD-10-CM | POA: Diagnosis not present

## 2015-03-10 DIAGNOSIS — Z23 Encounter for immunization: Secondary | ICD-10-CM

## 2015-03-10 DIAGNOSIS — Z79899 Other long term (current) drug therapy: Secondary | ICD-10-CM | POA: Diagnosis not present

## 2015-03-10 LAB — CBC WITH DIFFERENTIAL/PLATELET
BASO%: 0.3 % (ref 0.0–2.0)
Basophils Absolute: 0 10*3/uL (ref 0.0–0.1)
EOS%: 3 % (ref 0.0–7.0)
Eosinophils Absolute: 0.1 10*3/uL (ref 0.0–0.5)
HEMATOCRIT: 29.7 % — AB (ref 38.4–49.9)
HGB: 9.4 g/dL — ABNORMAL LOW (ref 13.0–17.1)
LYMPH%: 13.3 % — ABNORMAL LOW (ref 14.0–49.0)
MCH: 27.9 pg (ref 27.2–33.4)
MCHC: 31.6 g/dL — AB (ref 32.0–36.0)
MCV: 88.2 fL (ref 79.3–98.0)
MONO#: 0.4 10*3/uL (ref 0.1–0.9)
MONO%: 8.3 % (ref 0.0–14.0)
NEUT%: 75.1 % — AB (ref 39.0–75.0)
NEUTROS ABS: 3.3 10*3/uL (ref 1.5–6.5)
Platelets: 188 10*3/uL (ref 140–400)
RBC: 3.37 10*6/uL — AB (ref 4.20–5.82)
RDW: 18.2 % — ABNORMAL HIGH (ref 11.0–14.6)
WBC: 4.4 10*3/uL (ref 4.0–10.3)
lymph#: 0.6 10*3/uL — ABNORMAL LOW (ref 0.9–3.3)

## 2015-03-10 LAB — COMPREHENSIVE METABOLIC PANEL (CC13)
ALBUMIN: 2.9 g/dL — AB (ref 3.5–5.0)
ALT: 9 U/L (ref 0–55)
AST: 10 U/L (ref 5–34)
Alkaline Phosphatase: 72 U/L (ref 40–150)
Anion Gap: 8 mEq/L (ref 3–11)
BUN: 8.9 mg/dL (ref 7.0–26.0)
CHLORIDE: 107 meq/L (ref 98–109)
CO2: 27 mEq/L (ref 22–29)
Calcium: 9.7 mg/dL (ref 8.4–10.4)
Creatinine: 0.7 mg/dL (ref 0.7–1.3)
EGFR: >90 mL/min/{1.73_m2} (ref >90)
Glucose: 92 mg/dl (ref 70–140)
POTASSIUM: 3.9 meq/L (ref 3.5–5.1)
SODIUM: 142 meq/L (ref 136–145)
Total Bilirubin: 0.53 mg/dL (ref 0.20–1.20)
Total Protein: 6.3 g/dL — ABNORMAL LOW (ref 6.4–8.3)

## 2015-03-10 LAB — TSH CHCC: TSH: 2.089 m(IU)/L (ref 0.320–4.118)

## 2015-03-10 MED ORDER — INFLUENZA VAC SPLIT QUAD 0.5 ML IM SUSY
0.5000 mL | PREFILLED_SYRINGE | Freq: Once | INTRAMUSCULAR | Status: AC
  Administered 2015-03-10: 0.5 mL via INTRAMUSCULAR
  Filled 2015-03-10: qty 0.5

## 2015-03-10 MED ORDER — PROCHLORPERAZINE MALEATE 10 MG PO TABS
10.0000 mg | ORAL_TABLET | Freq: Once | ORAL | Status: AC
  Administered 2015-03-10: 10 mg via ORAL

## 2015-03-10 MED ORDER — OXYCODONE HCL 5 MG PO TABS: 5.0000 mg | ORAL_TABLET | Freq: Four times a day (QID) | ORAL | Status: DC | PRN

## 2015-03-10 MED ORDER — SODIUM CHLORIDE 0.9 % IV SOLN
2.0000 mg/kg | Freq: Once | INTRAVENOUS | Status: AC
  Administered 2015-03-10: 150 mg via INTRAVENOUS
  Filled 2015-03-10: qty 6

## 2015-03-10 MED ORDER — RIVAROXABAN (XARELTO) VTE STARTER PACK (15 & 20 MG): ORAL_TABLET | ORAL | Status: DC

## 2015-03-10 MED ORDER — PROCHLORPERAZINE MALEATE 10 MG PO TABS
ORAL_TABLET | ORAL | Status: AC
  Filled 2015-03-10: qty 1

## 2015-03-10 MED ORDER — FUROSEMIDE 20 MG PO TABS: ORAL_TABLET | ORAL | Status: DC

## 2015-03-10 MED ORDER — SODIUM CHLORIDE 0.9 % IV SOLN
Freq: Once | INTRAVENOUS | Status: AC
  Administered 2015-03-10: 11:00:00 via INTRAVENOUS

## 2015-03-10 NOTE — Progress Notes (Addendum)
[facility] Telephone:(336) [phone]   Fax:(336) [phone]  OFFICE PROGRESS NOTE  [name],[name] [name], MD [place] [address]  DIAGNOSIS: Non-small cell carcinoma of lung, stage 4  Primary site: Lung (Right)  Staging method: AJCC 7th Edition  Clinical: Stage IV (T1a, N3, M1b) signed by Curt Bears, MD on 02/01/2014 1:42 PM  Summary: Stage IV (T1a, N3, M1b) Prostate cancer  Primary site: Prostate  Clinical: Stage I (T1c, NX, M0) signed by Wyatt Portela, MD on 08/06/2013 1:59 PM  Summary: Stage I (T1c, NX, M0)  PRIOR THERAPY:  1) Systemic chemotherapy with carboplatin for an AUC of 5 and Alimta 500 mg per meter squared given every 3 weeks. Status post 6 cycles, last dose was given 05/20/2014 discontinued secondary to disease progression. 2) palliative radiotherapy to the right lung and lumbar spines under the care of Dr. Sondra Come. 3) status post craniotomy with tumor excision under the care of Dr. Vertell Limber on 12/04/2014.  CURRENT THERAPY: Immunotherapy with Ketruda (pembrolizumab) 2 MG/KG every 3 weeks. First dose 07/29/2014. He has positive PDL 1 expression (90%). Status post 6 cycles and his treatment was on hold secondary to the recent metastatic brain lesion and surgery. He is now status post 9 cycles.   INTERVAL HISTORY: Nicholas Schmitt 79 y.o. male returns to the clinic today for follow-up visit accompanied by his wife. He is feeling fine today with no specific complaints except for mild fatigue. He also feels cold all the time secondary to the anemia. He is currently on oral iron tablet once daily. He continues to have occasional episodes with speech trouble as well as numbness in his fingers. He is currently on Lovenox for deep venous thrombosis and pulmonary embolism but would like to be changed to an oral agent. He complains of feeling a "lump" in his throat that makes it difficult to swallow sometimes. He first noticed this when he  was receiving radiation therapy. He denied having any significant fever or chills, no nausea or vomiting. The patient denied having any significant chest pain, shortness of breath except with exertion, cough or hemoptysis. He is here today to start cycle #10 of his immunotherapy. He requests refill prescriptions for his oxycodone and lasix. He has remaining refills on his Remeron. He will contact his primary care provider for his Prilosec refill.  MEDICAL HISTORY: Past Medical History  Diagnosis Date  . CAD (coronary artery disease)     positive stress test in 2006 led to left heart cath (8/06) showing 95% prox RCA, 90% CFX, and 90% mLAD.  patient had a cypher DES to all 3 lesions  . Hyperlipidemia   . Hearing loss   . AAA (abdominal aortic aneurysm) 11/09    3.2 cm   . Chronic renal insufficiency   . History of radiation therapy 03/08/04- 04/08/04    prostate 4600 cGy in 3 fractions, radioactive seed implant 05/04/04  . Myocardial infarction 1995  . Pacemaker 2014  . Shortness of breath     exertion  . COPD (chronic obstructive pulmonary disease)   . Radiation Jan.11-Jan 29/2016    Right central chest 35 gray in 14 fractions  . Radiation Jan.2016    35 gray in 14 fx lower lumbar/upper sacrum  . Diverticulosis   . Hemorrhoids   . Pneumonia     hx  . GERD (gastroesophageal reflux disease)   . Hypertension     no med now for 3-4 months  .  Cancer 2015    prostate   . Prostate cancer 2005    gleason 7  . Skin cancer     ear  . Lung cancer   . Cancer December 04, 2014    Cranial     ALLERGIES:  is allergic to oxycodone hcl.  MEDICATIONS:  Current Outpatient Prescriptions  Medication Sig Dispense Refill  . albuterol (PROVENTIL,VENTOLIN) 90 MCG/ACT inhaler Inhale 2 puffs into the lungs every 6 (six) hours as needed for wheezing or shortness of breath.    . budesonide-formoterol (SYMBICORT) 160-4.5 MCG/ACT inhaler Inhale 2 puffs into the lungs 2 (two) times daily. (Patient taking  differently: Inhale 1 puff into the lungs 2 (two) times daily. )    . famotidine (PEPCID) 20 MG tablet Take 20 mg by mouth at bedtime.    . furosemide (LASIX) 20 MG tablet TAKE 1 TABLET (20 MG TOTAL) BY MOUTH DAILY. AS NEEDED FOR SWELLING. 30 tablet 0  . mirtazapine (REMERON) 30 MG tablet Take 1 tablet (30 mg total) by mouth at bedtime. 30 tablet 2  . Multiple Minerals (CALCIUM-MAGNESIUM-ZINC) TABS Take 1 tablet by mouth daily.    . Multiple Vitamin (MULTIVITAMIN WITH MINERALS) TABS tablet Take 1 tablet by mouth daily.    . nitroGLYCERIN (NITROSTAT) 0.4 MG SL tablet Place 0.4 mg under the tongue every 5 (five) minutes as needed for chest pain (MAX 3 TABLETS).    [name] omeprazole (PRILOSEC) 40 MG capsule Take 40 mg by mouth daily before supper.   0  . oxyCODONE (OXY IR/ROXICODONE) 5 MG immediate release tablet Take 1 tablet (5 mg total) by mouth every 6 (six) hours as needed for severe pain. 30 tablet 0  . prochlorperazine (COMPAZINE) 10 MG tablet Take 10 mg by mouth every 6 (six) hours as needed for nausea or vomiting.    . Rivaroxaban (XARELTO STARTER PACK) 15 & 20 MG TBPK Take as directed on package: Start with one '15mg'$  tablet by mouth twice a day with food. On Day 22, switch to one '20mg'$  tablet once a day with food. 51 each 0  . Sennosides (EX-LAX) 15 MG CHEW Chew 15 mg by mouth at bedtime.    . tamsulosin (FLOMAX) 0.4 MG CAPS capsule Take 0.4 mg by mouth at bedtime.   2   No current facility-administered medications for this visit.   Facility-Administered Medications Ordered in Other Visits  Medication Dose Route Frequency Provider Last Rate Last Dose  . heparin lock flush 100 unit/mL  500 Units Intracatheter Daily PRN Adrena E Johnson, PA-C      . sodium chloride 0.9 % injection 10 mL  10 mL Intracatheter PRN Adrena E Johnson, PA-C      . sodium chloride 0.9 % injection 3 mL  3 mL Intracatheter PRN Carlton Adam, PA-C        SURGICAL HISTORY:  Past Surgical History  Procedure Laterality  Date  . Ptca  2006  . Coronary stent placement    . Permanent pacemaker insertion Bilateral 02/26/13    MDT Adapta L implanted by Dr Rayann Heman for sick sinus syndrome  . Radioactive seed implant  05/04/2004    8000 cGy, Dr Danny Lawless Dr Reece Agar  . Elbow surgery Bilateral 1973  . Coronary angioplasty    . Mediastinoscopy N/A 01/28/2014    Procedure: MEDIASTINOSCOPY;  Surgeon: Melrose Nakayama, MD;  Location: [facility];  Service: Thoracic;  Laterality: N/A;  . Permanent pacemaker insertion N/A 02/26/2013    Procedure: PERMANENT PACEMAKER INSERTION;  Surgeon: [name], MD;  Location: [facility];  Service: Cardiovascular;  Laterality: N/A;  . Insert / replace / remove pacemaker      not removed  . Craniotomy N/A 12/04/2014    Procedure: CRANIOTOMY TUMOR EXCISION, LEFT;  Surgeon: [name], MD;  Location: [facility];  Service: Neurosurgery;  Laterality: N/A;  CRANIOTOMY TUMOR EXCISION  . Application of cranial navigation  12/04/2014    Procedure: APPLICATION OF CRANIAL NAVIGATION;  Surgeon: [name], MD;  Location: [facility];  Service: Neurosurgery;;    REVIEW OF SYSTEMS:  Constitutional: positive for fatigue Eyes: negative Ears, nose, mouth, throat, and face: negative Respiratory: positive for dyspnea on exertion Cardiovascular: negative Gastrointestinal: negative Genitourinary:negative Integument/breast: negative Hematologic/lymphatic: negative Musculoskeletal:negative Neurological: positive for paresthesia and speech problems Behavioral/Psych: negative Endocrine: negative Allergic/Immunologic: negative   PHYSICAL EXAMINATION: General appearance: alert, cooperative, fatigued and no distress Head: Normocephalic, without obvious abnormality, atraumatic Neck: no carotid bruit, supple, symmetrical, trachea midline, thyroid not enlarged, symmetric, no tenderness/mass/nodules and ~ 0.5 cm palpable right anterior cervical chain node/nodule, non-tender Lymph nodes: Cervical,  supraclavicular, and axillary nodes normal. Resp: clear to auscultation bilaterally Back: symmetric, no curvature. ROM normal. No CVA tenderness. Cardio: regular rate and rhythm, S1, S2 normal, no murmur, click, rub or gallop GI: soft, non-tender; bowel sounds normal; no masses,  no organomegaly Extremities: extremities normal, atraumatic, no cyanosis or edema Neurologic: Alert and oriented X 3, normal strength and tone. Normal symmetric reflexes. Normal coordination and gait  ECOG PERFORMANCE STATUS: 2 - Symptomatic, <50% confined to bed  Blood pressure 125/69, pulse 95, temperature 98.4 F (36.9 C), temperature source Oral, resp. rate 19, height 6' (1.829 m), weight 172 lb 3.2 oz (78.109 kg), SpO2 95 %.  LABORATORY DATA: Lab Results  Component Value Date   WBC 4.4 03/10/2015   HGB 9.4* 03/10/2015   HCT 29.7* 03/10/2015   MCV 88.2 03/10/2015   PLT 188 03/10/2015      Chemistry      Component Value Date/Time   NA 142 03/10/2015 0914   NA 135 11/28/2014 1353   K 3.9 03/10/2015 0914   K 4.8 11/28/2014 1353   CL 101 11/28/2014 1353   CO2 27 03/10/2015 0914   CO2 25 11/28/2014 1353   BUN 8.9 03/10/2015 0914   BUN 26* 11/28/2014 1353   CREATININE 0.7 03/10/2015 0914   CREATININE 0.73 11/28/2014 1353      Component Value Date/Time   CALCIUM 9.7 03/10/2015 0914   CALCIUM 9.6 11/28/2014 1353   ALKPHOS 72 03/10/2015 0914   ALKPHOS 66 07/21/2014 1821   AST 10 03/10/2015 0914   AST 43* 07/21/2014 1821   ALT 9 03/10/2015 0914   ALT 37 07/21/2014 1821   BILITOT 0.53 03/10/2015 0914   BILITOT 1.2 07/21/2014 1821       RADIOGRAPHIC STUDIES: No results found.  ASSESSMENT AND PLAN: this is a very pleasant 79 years old white male with stage IV non-small cell lung cancer, adenocarcinoma with positive PDL 1 expression, completed systemic chemotherapy with carboplatin and Alimta status post 6 cycles and tolerated his treatment fairly well.  He is currently on treatment with  immunotherapy with Ketruda (pembrolizumab)  with stable disease in the chest, abdomen and pelvis after 6 cycles .Unfortunately the patient was found to have metastatic brain lesions and he underwent craniotomy with resection of the brain tumor, after cycle #6 of treatment with Keytruda.  The patient is related to proceed with cycle #10 today  as a scheduled. He would come back for follow-up visit in 3 weeks for reevaluation before starting the next cycle of his treatment with a restaging CT scan of the neck, chest, abdomen and pelvis to re-evaluate his disease. For the patient, he was started on Remeron 30 mg by mouth daily at bedtime and feeling much better. For the swelling of the right upper extremity and neck area, He will continue on Lasix 20 mg by mouth daily as needed for swelling, a refill was sent to his pharmacy of record via [facility].  For pain management, he was given a refill of OxyIR 5 mg by mouth every 6 hours as needed, patient given a refill prescription today The patient was discussed with and also seen by Dr. Julien Nordmann. For anticoagulation the patient will complete his current supply of Lovenox injections then will start Xarelto 15 mg BID for 21 days then 20 mg by mouth daily. A prescription for a Xarelto starter pak was sent to his pharmacy of record via [facility]. He was advised to call if he has any concerning symptoms in the interval. The patient was advised to call immediately if he has any concerning symptoms in the interval. The patient voices understanding of current disease status and treatment options and is in agreement with the current care plan.  All questions were answered. The patient knows to call the clinic with any problems, questions or concerns. We can certainly see the patient much sooner if necessary.  Carlton Adam, PA-C 03/10/2015   ADDENDUM: Hematology/oncology Attending: I had a face to face encounter with the patient. I recommended his care plan. This  is a very pleasant 79 years old white male with stage IV non-small cell lung cancer, adenocarcinoma and positive PDL 1 expression currently undergoing treatment with immunotherapy with Ketruda (pembrolizumab) status post 9 cycles. The patient is tolerating his treatment well with no significant adverse effects except for feeling a lump in his throat started recently most likely secondary to laryngitis He denied having any significant nausea, vomiting or diarrhea. Denied having any significant skin rash. I recommended for the patient to proceed with cycle #10 of his treatment with Nat Math (pembrolizumab) today as scheduled. He would come back for follow-up visit in 3 weeks for evaluation after repeating CT scan of the neck, chest, abdomen and pelvis for restaging of his disease. For anticoagulation we switched the patient to Xarelto after he complains about the Lovenox injection. For pain management she will continue with OxyIR for now. The patient was advised to call immediately if he has any concerning symptoms in the interval.  Disclaimer: This note was dictated with voice recognition software. Similar sounding words can inadvertently be transcribed and may be missed upon review. Eilleen Kempf., MD 03/11/2015

## 2015-03-10 NOTE — Telephone Encounter (Signed)
per pof to sch pt appt-gave pt copy of avs-gave contrast

## 2015-03-10 NOTE — Progress Notes (Signed)
Brief nutrition follow-up with patient during infusion for lung cancer. Weight decreased and documented as 172.2 pounds September 27, down from 176 pounds September 20. Patient denies difficulty eating and states things are about the same. Patient declined nutrition samples/coupons.  Nutrition diagnosis: Food and nutrition related knowledge deficit improved.  Intervention:  Educated patient on importance of continued increased calories and protein containing foods. Encouraged patient to continue oral nutrition supplements daily. Teach back method used.  Monitoring, evaluation, goals: Patient will tolerate increased calories and protein to minimize further weight loss.  Next visit: Tuesday, October 18, during infusion.  **Disclaimer: This note was dictated with voice recognition software. Similar sounding words can inadvertently be transcribed and this note may contain transcription errors which may not have been corrected upon publication of note.**

## 2015-03-10 NOTE — Telephone Encounter (Signed)
As per [facility] call placed to pharmacy for rivaroxaban (XARELTO) starter pack.

## 2015-03-10 NOTE — Patient Instructions (Signed)
[facility] Discharge Instructions for Patients Receiving Chemotherapy  Today you received the following chemotherapy agents keytruda   To help prevent nausea and vomiting after your treatment, we encourage you to take your nausea medication as directed  If you develop nausea and vomiting that is not controlled by your nausea medication, call the clinic.   BELOW ARE SYMPTOMS THAT SHOULD BE REPORTED IMMEDIATELY:  *FEVER GREATER THAN 100.5 F  *CHILLS WITH OR WITHOUT FEVER  NAUSEA AND VOMITING THAT IS NOT CONTROLLED WITH YOUR NAUSEA MEDICATION  *UNUSUAL SHORTNESS OF BREATH  *UNUSUAL BRUISING OR BLEEDING  TENDERNESS IN MOUTH AND THROAT WITH OR WITHOUT PRESENCE OF ULCERS  *URINARY PROBLEMS  *BOWEL PROBLEMS  UNUSUAL RASH Items with * indicate a potential emergency and should be followed up as soon as possible.  Feel free to call the clinic you have any questions or concerns. The clinic phone number is [phone].  

## 2015-03-11 ENCOUNTER — Ambulatory Visit: Payer: Medicare Other | Admitting: Physical Therapy

## 2015-03-11 ENCOUNTER — Ambulatory Visit: Payer: Medicare Other | Admitting: Occupational Therapy

## 2015-03-11 ENCOUNTER — Ambulatory Visit: Payer: Medicare Other

## 2015-03-11 DIAGNOSIS — R279 Unspecified lack of coordination: Secondary | ICD-10-CM

## 2015-03-11 DIAGNOSIS — R4701 Aphasia: Secondary | ICD-10-CM

## 2015-03-11 DIAGNOSIS — R29898 Other symptoms and signs involving the musculoskeletal system: Secondary | ICD-10-CM

## 2015-03-11 NOTE — Therapy (Signed)
[facility] [address] [place], [place], [address] Phone: [phone]   Fax:  [phone]  Speech Language Pathology Treatment  Patient Details  Name: Nicholas Schmitt MRN: [id number] Date of Birth: 10/01/1935 Referring Provider:  Dorena Cookey, MD  Encounter Date: 03/11/2015      End of Session - 03/11/15 1110    Visit Number 5   Number of Visits 16   Date for SLP Re-Evaluation 04/13/15   SLP Start Time 10   SLP Stop Time  1100   SLP Time Calculation (min) 42 min   Activity Tolerance Patient tolerated treatment well      Past Medical History  Diagnosis Date  . CAD (coronary artery disease)     positive stress test in 2006 led to left heart cath (8/06) showing 95% prox RCA, 90% CFX, and 90% mLAD.  patient had a cypher DES to all 3 lesions  . Hyperlipidemia   . Hearing loss   . AAA (abdominal aortic aneurysm) 11/09    3.2 cm   . Chronic renal insufficiency   . History of radiation therapy 03/08/04- 04/08/04    prostate 4600 cGy in 3 fractions, radioactive seed implant 05/04/04  . Myocardial infarction 1995  . Pacemaker 2014  . Shortness of breath     exertion  . COPD (chronic obstructive pulmonary disease)   . Radiation Jan.11-Jan 29/2016    Right central chest 35 gray in 14 fractions  . Radiation Jan.2016    35 gray in 14 fx lower lumbar/upper sacrum  . Diverticulosis   . Hemorrhoids   . Pneumonia     hx  . GERD (gastroesophageal reflux disease)   . Hypertension     no med now for 3-4 months  . Cancer 2015    prostate   . Prostate cancer 2005    gleason 7  . Skin cancer     ear  . Lung cancer   . Cancer December 04, 2014    Cranial     Past Surgical History  Procedure Laterality Date  . Ptca  2006  . Coronary stent placement    . Permanent pacemaker insertion Bilateral 02/26/13    MDT Adapta L implanted by Dr Rayann Heman for sick sinus syndrome  . Radioactive seed implant  05/04/2004     8000 cGy, Dr Danny Lawless Dr Reece Agar  . Elbow surgery Bilateral 1973  . Coronary angioplasty    . Mediastinoscopy N/A 01/28/2014    Procedure: MEDIASTINOSCOPY;  Surgeon: Melrose Nakayama, MD;  Location: [facility];  Service: Thoracic;  Laterality: N/A;  . Permanent pacemaker insertion N/A 02/26/2013    Procedure: PERMANENT PACEMAKER INSERTION;  Surgeon: [name], MD;  Location: [facility];  Service: Cardiovascular;  Laterality: N/A;  . Insert / replace / remove pacemaker      not removed  . Craniotomy N/A 12/04/2014    Procedure: CRANIOTOMY TUMOR EXCISION, LEFT;  Surgeon: [name], MD;  Location: [facility];  Service: Neurosurgery;  Laterality: N/A;  CRANIOTOMY TUMOR EXCISION  . Application of cranial navigation  12/04/2014    Procedure: APPLICATION OF CRANIAL NAVIGATION;  Surgeon: [name], MD;  Location: [facility];  Service: Neurosurgery;;    There were no vitals filed for this visit.  Visit Diagnosis: Expressive aphasia  Receptive aphasia      Subjective Assessment - 03/11/15 1024    Subjective "I had a hard time getting sleep (last night)."  ADULT SLP TREATMENT - 03/11/15 1024    General Information   Behavior/Cognition Alert;Cooperative;Pleasant mood   Pain Assessment   Pain Assessment 0-10   Pain Score 5    Pain Location rt leg   Pain Descriptors / Indicators Sore;Sharp   Pain Intervention(s) Monitored during session   Cognitive-Linquistic Treatment   Treatment focused on Aphasia   Skilled Treatment Pt required consistent min cues with printing the name and address. In directions with before and after, pt required usual mod A from SLP. Pt with good (not excellent ) emergent awareness for errors.   Assessment / Recommendations / Plan   Plan Continue with current plan of care   Progression Toward Goals   Progression toward goals Progressing toward goals            SLP Short Term Goals - 03/11/15 1025    SLP SHORT TERM GOAL #1    Title pt will complete simple naming tasks with 85% success, functionally   Time 2   Period Weeks   Status On-going   SLP SHORT TERM GOAL #2   Title pt will demo understanding of mod complex linguistic stimuli (directions with before/after, yes/no questions, etc) 75% with occasional min A   Time 2   Period Weeks   Status On-going   SLP SHORT TERM GOAL #3   Title pt will demo understanding of 3-4 sentence paragraphs (longer verbal stimuli) by answering yes/no or "wh" questions with 80% success and occasional min cues   Time 2   Period Weeks   Status On-going   SLP SHORT TERM GOAL #4   Title pt will write name and address without signs of aphasia   Time 2   Period Weeks   Status On-going          SLP Long Term Goals - 03/11/15 1030    SLP LONG TERM GOAL #1   Title pt will demo understanding of functional 10 minutes simple to mod complex conversation with request for repeats allowed   Time 6   Period Weeks   Status On-going   SLP LONG TERM GOAL #2   Title pt will participate in 10 minutes simple conversation with compensations with rare min questioning cues   Time 6   Period Weeks   Status On-going   SLP LONG TERM GOAL #3   Title pt will write simple functional lists with rare min A for aphasic errors   Time 6   Period Weeks   Status On-going          Plan - 03/11/15 1110    Clinical Impression Statement Pt cont with receptive and expressive aphasia due to mets to brain and subsequent craniotomy. ST to cont to improve receptive and expressive language.   Speech Therapy Frequency 2x / week   Duration --  6 weeks   Potential to Achieve Goals Fair   Potential Considerations Co-morbidities        Problem List Patient Active Problem List   Diagnosis Date Noted  . Hyperbilirubinemia 01/17/2015  . DVT (deep venous thrombosis) 01/17/2015  . Sensation of cold in leg 01/06/2015  . Pain of left leg 01/06/2015  . Metastasis to brain 12/04/2014  . Brain metastasis  11/17/2014  . Encounter for antineoplastic immunotherapy 11/17/2014  . Other pancytopenia   . Acute respiratory failure with hypoxia   . Antineoplastic chemotherapy induced pancytopenia   . SOB (shortness of breath) 07/21/2014  . CAP (community acquired pneumonia) 07/21/2014  . Neoplasm related  pain 06/18/2014  . Anemia in neoplastic disease 06/04/2014  . Dyspnea 06/02/2014  . Acute right hip pain 05/26/2014  . Non-small cell carcinoma of lung, stage 4 01/30/2014  . Nodule of right lung 01/21/2014  . Mediastinal adenopathy 01/21/2014  . Prostate cancer   . History of radiation therapy   . 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 Sick sinus syndrome 04/16/2013  . Cardiac arrhythmia 07/12/2011  . COPD GOLD II 08/27/2010  . Abdominal aortic aneurysm 05/14/2008  . ACTINIC KERATOSIS, HEAD 02/11/2008  . LOSS, CONDUCTIVE HEARING, BILATERAL 01/18/2007  . HLD (hyperlipidemia) 12/25/2006  . Essential hypertension 12/25/2006  . Coronary atherosclerosis 12/25/2006    [facility] , MS, CCC-SLP  03/11/2015, 11:14 AM  [facility] [address] [place], [place], [address] Phone: [phone]   Fax:  [phone]

## 2015-03-11 NOTE — Therapy (Signed)
[facility] [address] [place], [place], [address] Phone: [phone]   Fax:  [phone]  Occupational Therapy Treatment  Patient Details  Name: Nicholas Schmitt MRN: [id number] Date of Birth: 08-06-1935 Referring Provider:  Dorena Cookey, MD  Encounter Date: 03/11/2015      OT End of Session - 03/11/15 1134    Visit Number 5   Number of Visits 17   Date for OT Re-Evaluation 04/13/15   Authorization Type Blue MCR - G code   Authorization - Visit Number 5   Authorization - Number of Visits 10   OT Start Time 1105   OT Stop Time 1145   OT Time Calculation (min) 40 min   Equipment Utilized During Treatment UBE   Activity Tolerance Patient tolerated treatment well      Past Medical History  Diagnosis Date  . CAD (coronary artery disease)     positive stress test in 2006 led to left heart cath (8/06) showing 95% prox RCA, 90% CFX, and 90% mLAD.  patient had a cypher DES to all 3 lesions  . Hyperlipidemia   . Hearing loss   . AAA (abdominal aortic aneurysm) 11/09    3.2 cm   . Chronic renal insufficiency   . History of radiation therapy 03/08/04- 04/08/04    prostate 4600 cGy in 3 fractions, radioactive seed implant 05/04/04  . Myocardial infarction 1995  . Pacemaker 2014  . Shortness of breath     exertion  . COPD (chronic obstructive pulmonary disease)   . Radiation Jan.11-Jan 29/2016    Right central chest 35 gray in 14 fractions  . Radiation Jan.2016    35 gray in 14 fx lower lumbar/upper sacrum  . Diverticulosis   . Hemorrhoids   . Pneumonia     hx  . GERD (gastroesophageal reflux disease)   . Hypertension     no med now for 3-4 months  . Cancer 2015    prostate   . Prostate cancer 2005    gleason 7  . Skin cancer     ear  . Lung cancer   . Cancer December 04, 2014    Cranial     Past Surgical History  Procedure Laterality Date  . Ptca  2006  . Coronary stent placement    . Permanent  pacemaker insertion Bilateral 02/26/13    MDT Adapta L implanted by Dr Rayann Heman for sick sinus syndrome  . Radioactive seed implant  05/04/2004    8000 cGy, Dr Danny Lawless Dr Reece Agar  . Elbow surgery Bilateral 1973  . Coronary angioplasty    . Mediastinoscopy N/A 01/28/2014    Procedure: MEDIASTINOSCOPY;  Surgeon: Melrose Nakayama, MD;  Location: [facility];  Service: Thoracic;  Laterality: N/A;  . Permanent pacemaker insertion N/A 02/26/2013    Procedure: PERMANENT PACEMAKER INSERTION;  Surgeon: [name], MD;  Location: [facility];  Service: Cardiovascular;  Laterality: N/A;  . Insert / replace / remove pacemaker      not removed  . Craniotomy N/A 12/04/2014    Procedure: CRANIOTOMY TUMOR EXCISION, LEFT;  Surgeon: [name], MD;  Location: [facility];  Service: Neurosurgery;  Laterality: N/A;  CRANIOTOMY TUMOR EXCISION  . Application of cranial navigation  12/04/2014    Procedure: APPLICATION OF CRANIAL NAVIGATION;  Surgeon: [name], MD;  Location: [facility];  Service: Neurosurgery;;    There were no vitals filed for this visit.  Visit Diagnosis:  Weakness  of right arm  Lack of coordination  Weakness of right hand      Subjective Assessment - 03/11/15 1102    Subjective  I'm doing okay   Limitations DVT RLE (on active treatment), RUE clots (on active treatment), *active CA with chemo treatments   Patient Stated Goals get my dominant hand working good   Currently in Pain? Yes   Pain Score 6    Pain Location Leg  O.T. not addressing   Pain Orientation Right                      OT Treatments/Exercises (OP) - 03/11/15 0001    ADLs   Writing Practiced writing name x 5 with built up pen and approx. 85% accuracy.  Then practiced tracing over letters with highlighter for control/accuracy Rt hand. Progressed to distal finger control worksheet (as pre-writing ex) with mod difficulty, decreased accuracy.    Shoulder Exercises: ROM/Strengthening   UBE  (Upper Arm Bike) UBE x 10 min. Level 3 for strength/endurance with 1 rest break required. Verbally reviewed theraband HEP - Pt had no further questions   Hand Exercises   Other Hand Exercises Gripper set at 35 lbs. resistance to pick up blocks for sustained grip strength Rt hand with min difficulty and drops.                   OT Short Term Goals - 02/25/15 1143    OT SHORT TERM GOAL #1   Title Independent with coordination and putty HEP for Rt hand (due 03/14/15)   Time 4   Period Weeks   Status Achieved   OT SHORT TERM GOAL #2   Title Improve grip strength by 10 lbs or greater Rt hand    Baseline Rt = 30 (Lt = 62 lbs)   Time 4   Period Weeks   Status New   OT SHORT TERM GOAL #3   Title Improve coordination as evidenced by performing 9 hole peg test in 90 sec or less   Baseline 102.85 sec.    Time 4   Period Weeks   Status New   OT SHORT TERM GOAL #4   Title Pt to write name at 90% or greater legibility   Baseline 75%   Time 4   Period Weeks   Status On-going           OT Long Term Goals - 02/12/15 1522    OT LONG TERM GOAL #1   Title Independent w/ strengthening HEP for RUE - due 04/13/15   Time 8   Period Weeks   Status New   OT LONG TERM GOAL #2   Title Improve grip strength to 50 lbs or greater Rt hand   Baseline eval = 30 lbs   Time 8   Period Weeks   Status New   OT LONG TERM GOAL #3   Title Improve coordination as evidenced by performing 9 hole peg test in 75 sec. or less   Baseline eval = 102.85 sec.    Time 8   Period Weeks   Status New   OT LONG TERM GOAL #4   Title Pt to write check at 90% legibility or greater   Baseline eval = 75% name only   Time 8   Period Weeks   Status New   OT LONG TERM GOAL #5   Title Pt to prepare simple meal (stovetop) with distant supervision safely   Time 8  Period Weeks   Status New               Plan - 03/11/15 1135    Clinical Impression Statement Pt approximating remaining STG's. Pt with  increased sustained grip strength today.    Plan continue coordination, RUE strength/endurance   OT Home Exercise Plan education issued:  02/24/15 coordination/red putty HEP; added coordination ex on 02/25/15. Theraband HEP on 03/05/15   Consulted and Agree with Plan of Care Patient        Problem List Patient Active Problem List   Diagnosis Date Noted  . Hyperbilirubinemia 01/17/2015  . DVT (deep venous thrombosis) 01/17/2015  . Sensation of cold in leg 01/06/2015  . Pain of left leg 01/06/2015  . Metastasis to brain 12/04/2014  . Brain metastasis 11/17/2014  . Encounter for antineoplastic immunotherapy 11/17/2014  . Other pancytopenia   . Acute respiratory failure with hypoxia   . Antineoplastic chemotherapy induced pancytopenia   . SOB (shortness of breath) 07/21/2014  . CAP (community acquired pneumonia) 07/21/2014  . Neoplasm related pain 06/18/2014  . Anemia in neoplastic disease 06/04/2014  . Dyspnea 06/02/2014  . Acute right hip pain 05/26/2014  . Non-small cell carcinoma of lung, stage 4 01/30/2014  . Nodule of right lung 01/21/2014  . Mediastinal adenopathy 01/21/2014  . Prostate cancer   . History of radiation therapy   . 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 Sick sinus syndrome 04/16/2013  . Cardiac arrhythmia 07/12/2011  . COPD GOLD II 08/27/2010  . Abdominal aortic aneurysm 05/14/2008  . ACTINIC KERATOSIS, HEAD 02/11/2008  . LOSS, CONDUCTIVE HEARING, BILATERAL 01/18/2007  . HLD (hyperlipidemia) 12/25/2006  . Essential hypertension 12/25/2006  . Coronary atherosclerosis 12/25/2006    Carey Bullocks, OTR/L 03/11/2015, 11:37 AM  [facility] [address] [place], [place], [address] Phone: [phone]   Fax:  [phone]

## 2015-03-11 NOTE — Patient Instructions (Signed)
Start Xarelto as prescribed once you finish your Lovenox injections Follow up in 3 weeks prior to your next scheduled cycle of immunotherapy with a restaging CT scan of your chest, abdomen and pelvis to re-evaluate your disease

## 2015-03-12 ENCOUNTER — Ambulatory Visit: Payer: Medicare Other | Admitting: Physical Therapy

## 2015-03-12 ENCOUNTER — Other Ambulatory Visit: Payer: Medicare Other

## 2015-03-12 ENCOUNTER — Ambulatory Visit: Payer: Medicare Other

## 2015-03-12 ENCOUNTER — Ambulatory Visit: Payer: Medicare Other | Admitting: Internal Medicine

## 2015-03-12 ENCOUNTER — Ambulatory Visit: Payer: Medicare Other | Admitting: Occupational Therapy

## 2015-03-12 DIAGNOSIS — R4701 Aphasia: Secondary | ICD-10-CM

## 2015-03-12 DIAGNOSIS — R29898 Other symptoms and signs involving the musculoskeletal system: Secondary | ICD-10-CM | POA: Diagnosis not present

## 2015-03-12 DIAGNOSIS — M6281 Muscle weakness (generalized): Secondary | ICD-10-CM

## 2015-03-12 DIAGNOSIS — R269 Unspecified abnormalities of gait and mobility: Secondary | ICD-10-CM

## 2015-03-12 DIAGNOSIS — R279 Unspecified lack of coordination: Secondary | ICD-10-CM

## 2015-03-12 NOTE — Therapy (Signed)
[facility] [address] [place], [place], [address] Phone: [phone]   Fax:  [phone]  Speech Language Pathology Treatment  Patient Details  Name: Nicholas Schmitt MRN: [id number] Date of Birth: September 01, 1935 Referring Provider:  Dorena Cookey, MD  Encounter Date: 03/12/2015      End of Session - 03/12/15 1649    Visit Number 6   Number of Visits 16   Date for SLP Re-Evaluation 04/13/15   SLP Start Time 1532   SLP Stop Time  4627   SLP Time Calculation (min) 43 min   Activity Tolerance Patient tolerated treatment well      Past Medical History  Diagnosis Date  . CAD (coronary artery disease)     positive stress test in 2006 led to left heart cath (8/06) showing 95% prox RCA, 90% CFX, and 90% mLAD.  patient had a cypher DES to all 3 lesions  . Hyperlipidemia   . Hearing loss   . AAA (abdominal aortic aneurysm) 11/09    3.2 cm   . Chronic renal insufficiency   . History of radiation therapy 03/08/04- 04/08/04    prostate 4600 cGy in 3 fractions, radioactive seed implant 05/04/04  . Myocardial infarction 1995  . Pacemaker 2014  . Shortness of breath     exertion  . COPD (chronic obstructive pulmonary disease)   . Radiation Jan.11-Jan 29/2016    Right central chest 35 gray in 14 fractions  . Radiation Jan.2016    35 gray in 14 fx lower lumbar/upper sacrum  . Diverticulosis   . Hemorrhoids   . Pneumonia     hx  . GERD (gastroesophageal reflux disease)   . Hypertension     no med now for 3-4 months  . Cancer 2015    prostate   . Prostate cancer 2005    gleason 7  . Skin cancer     ear  . Lung cancer   . Cancer December 04, 2014    Cranial     Past Surgical History  Procedure Laterality Date  . Ptca  2006  . Coronary stent placement    . Permanent pacemaker insertion Bilateral 02/26/13    MDT Adapta L implanted by Dr Rayann Heman for sick sinus syndrome  . Radioactive seed implant  05/04/2004     8000 cGy, Dr Danny Lawless Dr Reece Agar  . Elbow surgery Bilateral 1973  . Coronary angioplasty    . Mediastinoscopy N/A 01/28/2014    Procedure: MEDIASTINOSCOPY;  Surgeon: Melrose Nakayama, MD;  Location: [facility];  Service: Thoracic;  Laterality: N/A;  . Permanent pacemaker insertion N/A 02/26/2013    Procedure: PERMANENT PACEMAKER INSERTION;  Surgeon: [name], MD;  Location: [facility];  Service: Cardiovascular;  Laterality: N/A;  . Insert / replace / remove pacemaker      not removed  . Craniotomy N/A 12/04/2014    Procedure: CRANIOTOMY TUMOR EXCISION, LEFT;  Surgeon: [name], MD;  Location: [facility];  Service: Neurosurgery;  Laterality: N/A;  CRANIOTOMY TUMOR EXCISION  . Application of cranial navigation  12/04/2014    Procedure: APPLICATION OF CRANIAL NAVIGATION;  Surgeon: [name], MD;  Location: [facility];  Service: Neurosurgery;;    There were no vitals filed for this visit.  Visit Diagnosis: Expressive aphasia  Receptive aphasia      Subjective Assessment - 03/12/15 1535    Subjective "Had a cards." (re: OT - looked through cards)  ADULT SLP TREATMENT - 03/12/15 1536    General Information   Behavior/Cognition Alert;Cooperative;Pleasant mood   Treatment Provided   Treatment provided Cognitive-Linquistic   Pain Assessment   Pain Assessment 0-10   Pain Score 5    Pain Location rt leg/hip   Pain Descriptors / Indicators Sore   Pain Intervention(s) Monitored during session   Cognitive-Linquistic Treatment   Treatment focused on Aphasia   Skilled Treatment Expresive ID pictures (actions) with 20% success, incr'd to 45% with SLP mod A occasionally. SLP req'd to cue pt usually to reduce rate of speech, otherwise pt speech intelligibility due to jargon/semantic paraphasia predominated. Simple Q&A answered with 1-2 word answers with 80% success. Pt described pictures with simple sentences with <10% success, however pt described his 3  exercises for PT functionally. 3-sentence selections read by SLP were followed by questions from SLP with 75% success. SLP req'd to read sentences again for incr'd pt success.   Assessment / Recommendations / Plan   Plan Continue with current plan of care   Progression Toward Goals   Progression toward goals Progressing toward goals            SLP Short Term Goals - 03/12/15 1607    SLP SHORT TERM GOAL #1   Title pt will complete simple naming tasks with 85% success, functionally   Status Achieved   SLP SHORT TERM GOAL #2   Title pt will demo understanding of mod complex linguistic stimuli (directions with before/after, yes/no questions, etc) 75% with occasional min A   Time 2   Period Weeks   Status On-going   SLP SHORT TERM GOAL #3   Title pt will demo understanding of 3-4 sentence paragraphs (longer verbal stimuli) by answering yes/no or "wh" questions with 80% success and occasional min cues   Time 2   Period Weeks   Status On-going   SLP SHORT TERM GOAL #4   Title pt will write name and address without signs of aphasia   Time 2   Period Weeks   Status On-going          SLP Long Term Goals - 03/12/15 1608    SLP LONG TERM GOAL #1   Title pt will demo understanding of functional 10 minutes simple to mod complex conversation with request for repeats allowed   Time 6   Period Weeks   Status On-going   SLP LONG TERM GOAL #2   Title pt will participate in 10 minutes simple conversation with compensations with rare min questioning cues   Time 6   Period Weeks   Status On-going   SLP LONG TERM GOAL #3   Title pt will write simple functional lists with rare min A for aphasic errors   Time 6   Period Weeks   Status On-going          Plan - 03/12/15 1649    Clinical Impression Statement Pt cont with receptive and expressive aphasia due to mets to brain and subsequent craniotomy. ST to cont to improve receptive and expressive language.   Speech Therapy Frequency 2x  / week   Duration --  6 weeks   Treatment/Interventions Compensatory strategies;SLP instruction and feedback;Patient/family education;Functional tasks;Cueing hierarchy;Language facilitation   Potential to Achieve Goals Fair   Potential Considerations Co-morbidities        Problem List Patient Active Problem List   Diagnosis Date Noted  . Hyperbilirubinemia 01/17/2015  . DVT (deep venous thrombosis) 01/17/2015  . Sensation of cold  in leg 01/06/2015  . Pain of left leg 01/06/2015  . Metastasis to brain 12/04/2014  . Brain metastasis 11/17/2014  . Encounter for antineoplastic immunotherapy 11/17/2014  . Other pancytopenia   . Acute respiratory failure with hypoxia   . Antineoplastic chemotherapy induced pancytopenia   . SOB (shortness of breath) 07/21/2014  . CAP (community acquired pneumonia) 07/21/2014  . Neoplasm related pain 06/18/2014  . Anemia in neoplastic disease 06/04/2014  . Dyspnea 06/02/2014  . Acute right hip pain 05/26/2014  . Non-small cell carcinoma of lung, stage 4 01/30/2014  . Nodule of right lung 01/21/2014  . Mediastinal adenopathy 01/21/2014  . Prostate cancer   . History of radiation therapy   . 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 Sick sinus syndrome 04/16/2013  . Cardiac arrhythmia 07/12/2011  . COPD GOLD II 08/27/2010  . Abdominal aortic aneurysm 05/14/2008  . ACTINIC KERATOSIS, HEAD 02/11/2008  . LOSS, CONDUCTIVE HEARING, BILATERAL 01/18/2007  . HLD (hyperlipidemia) 12/25/2006  . Essential hypertension 12/25/2006  . Coronary atherosclerosis 12/25/2006    [facility] , MS, [place]  03/12/2015, 4:50 PM  [facility] [address] [place], [place], [address] Phone: [phone]   Fax:  [phone]

## 2015-03-12 NOTE — Therapy (Signed)
[facility] [address] [place], [place], [address] Phone: [phone]   Fax:  [phone]  Occupational Therapy Treatment  Patient Details  Name: Nicholas Schmitt MRN: [id number] Date of Birth: 09/03/1935 Referring Provider:  Dorena Cookey, MD  Encounter Date: 03/12/2015      OT End of Session - 03/12/15 1525    Visit Number 6   Number of Visits 17   Date for OT Re-Evaluation 04/13/15   Authorization Type Blue MCR - G code   Authorization - Visit Number 6   Authorization - Number of Visits 10   OT Start Time 1448   OT Stop Time 1533   OT Time Calculation (min) 45 min   Equipment Utilized During Treatment UBE   Activity Tolerance Patient tolerated treatment well      Past Medical History  Diagnosis Date  . CAD (coronary artery disease)     positive stress test in 2006 led to left heart cath (8/06) showing 95% prox RCA, 90% CFX, and 90% mLAD.  patient had a cypher DES to all 3 lesions  . Hyperlipidemia   . Hearing loss   . AAA (abdominal aortic aneurysm) 11/09    3.2 cm   . Chronic renal insufficiency   . History of radiation therapy 03/08/04- 04/08/04    prostate 4600 cGy in 3 fractions, radioactive seed implant 05/04/04  . Myocardial infarction 1995  . Pacemaker 2014  . Shortness of breath     exertion  . COPD (chronic obstructive pulmonary disease)   . Radiation Jan.11-Jan 29/2016    Right central chest 35 gray in 14 fractions  . Radiation Jan.2016    35 gray in 14 fx lower lumbar/upper sacrum  . Diverticulosis   . Hemorrhoids   . Pneumonia     hx  . GERD (gastroesophageal reflux disease)   . Hypertension     no med now for 3-4 months  . Cancer 2015    prostate   . Prostate cancer 2005    gleason 7  . Skin cancer     ear  . Lung cancer   . Cancer December 04, 2014    Cranial     Past Surgical History  Procedure Laterality Date  . Ptca  2006  . Coronary stent placement    . Permanent  pacemaker insertion Bilateral 02/26/13    MDT Adapta L implanted by Dr Rayann Heman for sick sinus syndrome  . Radioactive seed implant  05/04/2004    8000 cGy, Dr Danny Lawless Dr Reece Agar  . Elbow surgery Bilateral 1973  . Coronary angioplasty    . Mediastinoscopy N/A 01/28/2014    Procedure: MEDIASTINOSCOPY;  Surgeon: Melrose Nakayama, MD;  Location: [facility];  Service: Thoracic;  Laterality: N/A;  . Permanent pacemaker insertion N/A 02/26/2013    Procedure: PERMANENT PACEMAKER INSERTION;  Surgeon: [name], MD;  Location: [facility] C[facility];  Service: Cardiovascular;  Laterality: N/A;  . Insert / replace / remove pacemaker      not removed  . Craniotomy N/A 12/04/2014    Procedure: CRANIOTOMY TUMOR EXCISION, LEFT;  Surgeon: [name], MD;  Location: [facility];  Service: Neurosurgery;  Laterality: N/A;  CRANIOTOMY TUMOR EXCISION  . Application of cranial navigation  12/04/2014    Procedure: APPLICATION OF CRANIAL NAVIGATION;  Surgeon: [name], MD;  Location: [facility];  Service: Neurosurgery;;    There were no vitals filed for this visit.  Visit Diagnosis:  Weakness  of right arm  Lack of coordination      Subjective Assessment - 03/12/15 1451    Subjective  I feel like my endurance is getting better   Limitations DVT RLE (on active treatment), RUE clots (on active treatment), *active CA with chemo treatments   Patient Stated Goals get my dominant hand working good   Currently in Pain? Yes  Rt leg - O.T. not addressing                       OT Treatments/Exercises (OP) - 03/12/15 0001    Shoulder Exercises: ROM/Strengthening   UBE (Upper Arm Bike) UBE x 8 min. Level 3 for strength/endurance with 1 rest break required   Other ROM/Strengthening Exercises Supine: sh. flexion x 10 reps with 2 lb. weight but d/c secondary to elbow pain. Chair push ups x 10 reps. Modified bridging over RUE seated with max difficulty x 5 reps.    Fine Motor Coordination   Fine Motor  Coordination Dealing card with thumb   In Hand Manipulation Training Translating stress balls in Rt hand with max difficulty and mod drops   Small Pegboard Pt placing O'Connor pegs in pegboard with min to mod difficulty and drops Rt hand; then removing with tweezers.    Dealing card with thumb with mod difficulty, decreased accuracy                  OT Short Term Goals - 02/25/15 1143    OT SHORT TERM GOAL #1   Title Independent with coordination and putty HEP for Rt hand (due 03/14/15)   Time 4   Period Weeks   Status Achieved   OT SHORT TERM GOAL #2   Title Improve grip strength by 10 lbs or greater Rt hand    Baseline Rt = 30 (Lt = 62 lbs)   Time 4   Period Weeks   Status New   OT SHORT TERM GOAL #3   Title Improve coordination as evidenced by performing 9 hole peg test in 90 sec or less   Baseline 102.85 sec.    Time 4   Period Weeks   Status New   OT SHORT TERM GOAL #4   Title Pt to write name at 90% or greater legibility   Baseline 75%   Time 4   Period Weeks   Status On-going           OT Long Term Goals - 02/12/15 1522    OT LONG TERM GOAL #1   Title Independent w/ strengthening HEP for RUE - due 04/13/15   Time 8   Period Weeks   Status New   OT LONG TERM GOAL #2   Title Improve grip strength to 50 lbs or greater Rt hand   Baseline eval = 30 lbs   Time 8   Period Weeks   Status New   OT LONG TERM GOAL #3   Title Improve coordination as evidenced by performing 9 hole peg test in 75 sec. or less   Baseline eval = 102.85 sec.    Time 8   Period Weeks   Status New   OT LONG TERM GOAL #4   Title Pt to write check at 90% legibility or greater   Baseline eval = 75% name only   Time 8   Period Weeks   Status New   OT LONG TERM GOAL #5   Title Pt to prepare simple meal (stovetop) with  distant supervision safely   Time 8   Period Weeks   Status New               Plan - 03/12/15 1526    Clinical Impression Statement Pt with improved  endurance.    Plan assess remaining STG's next week, continue RUE strength/endurance, coordination, grip strength   OT Home Exercise Plan education issued:  02/24/15 coordination/red putty HEP; added coordination ex on 02/25/15. Theraband HEP on 03/05/15   Consulted and Agree with Plan of Care Patient        Problem List Patient Active Problem List   Diagnosis Date Noted  . Hyperbilirubinemia 01/17/2015  . DVT (deep venous thrombosis) 01/17/2015  . Sensation of cold in leg 01/06/2015  . Pain of left leg 01/06/2015  . Metastasis to brain 12/04/2014  . Brain metastasis 11/17/2014  . Encounter for antineoplastic immunotherapy 11/17/2014  . Other pancytopenia   . Acute respiratory failure with hypoxia   . Antineoplastic chemotherapy induced pancytopenia   . SOB (shortness of breath) 07/21/2014  . CAP (community acquired pneumonia) 07/21/2014  . Neoplasm related pain 06/18/2014  . Anemia in neoplastic disease 06/04/2014  . Dyspnea 06/02/2014  . Acute right hip pain 05/26/2014  . Non-small cell carcinoma of lung, stage 4 01/30/2014  . Nodule of right lung 01/21/2014  . Mediastinal adenopathy 01/21/2014  . Prostate cancer   . History of radiation therapy   . 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 Sick sinus syndrome 04/16/2013  . Cardiac arrhythmia 07/12/2011  . COPD GOLD II 08/27/2010  . Abdominal aortic aneurysm 05/14/2008  . ACTINIC KERATOSIS, HEAD 02/11/2008  . LOSS, CONDUCTIVE HEARING, BILATERAL 01/18/2007  . HLD (hyperlipidemia) 12/25/2006  . Essential hypertension 12/25/2006  . Coronary atherosclerosis 12/25/2006    Carey Bullocks, OTR/L 03/12/2015, 3:28 PM  [facility] [address] [place], [place], [address] Phone: [phone]   Fax:  [phone]

## 2015-03-12 NOTE — Patient Instructions (Addendum)
Knee-to-Chest Stretch: Unilateral   With hand behind right knee, pull knee in to chest until a comfortable stretch is felt in lower back and buttocks. Keep back relaxed. Hold 30 seconds. Repeat 3 times per set. Do 2 sessions per day.  Can also do whenever feeling tightness in low back. http://orth.exer.us/126   Copyright  VHI. All rights reserved.  Double Knee to Chest (Flexion)   Gently pull both knees toward chest. Feel stretch in lower back or buttock area. Breathing deeply, Hold 30 seconds. Repeat 3 times. Do 2 sessions per day.  Can also do whenever feeling tightness in low back.  http://gt2.exer.us/227   Copyright  VHI. All rights reserved.   Side Twist, Supine (Non-Weight Bearing)   Lie on back, feet flat on floor. Move knees to one side while keeping shoulders and head flat on bed.  Hold this position for 30 seconds.   Repeat 3 times to each side. Do 2 sessions per day.  Copyright  VHI. All rights reserved.     Walking Program  Start off with walking 4 minutes 3 times a day with someone with you.  Add 1 minute per week (so week 2 you would be walking 5 minutes).  Work slowly up to 10 minutes at a time.

## 2015-03-12 NOTE — Therapy (Signed)
[facility] [address] [place], [place], [address] Phone: [phone]   Fax:  [phone]  Physical Therapy Treatment  Patient Details  Name: Nicholas Schmitt MRN: [id number] Date of Birth: Jun 20, 1935 Referring Provider:  Dorena Cookey, MD  Encounter Date: 03/12/2015      PT End of Session - 03/12/15 1747    Visit Number 5   Number of Visits 17   Date for PT Re-Evaluation 04/14/15   Authorization Type BCBS Medicare-G-code required every 10th visit   PT Start Time 1404   PT Stop Time 1445   PT Time Calculation (min) 41 min   Equipment Utilized During Treatment Gait belt   Activity Tolerance Patient tolerated treatment well   Behavior During Therapy [facility] for tasks assessed/performed      Past Medical History  Diagnosis Date  . CAD (coronary artery disease)     positive stress test in 2006 led to left heart cath (8/06) showing 95% prox RCA, 90% CFX, and 90% mLAD.  patient had a cypher DES to all 3 lesions  . Hyperlipidemia   . Hearing loss   . AAA (abdominal aortic aneurysm) 11/09    3.2 cm   . Chronic renal insufficiency   . History of radiation therapy 03/08/04- 04/08/04    prostate 4600 cGy in 3 fractions, radioactive seed implant 05/04/04  . Myocardial infarction 1995  . Pacemaker 2014  . Shortness of breath     exertion  . COPD (chronic obstructive pulmonary disease)   . Radiation Jan.11-Jan 29/2016    Right central chest 35 gray in 14 fractions  . Radiation Jan.2016    35 gray in 14 fx lower lumbar/upper sacrum  . Diverticulosis   . Hemorrhoids   . Pneumonia     hx  . GERD (gastroesophageal reflux disease)   . Hypertension     no med now for 3-4 months  . Cancer 2015    prostate   . Prostate cancer 2005    gleason 7  . Skin cancer     ear  . Lung cancer   . Cancer December 04, 2014    Cranial     Past Surgical History  Procedure Laterality Date  . Ptca  2006  . Coronary stent placement     . Permanent pacemaker insertion Bilateral 02/26/13    MDT Adapta L implanted by Dr Rayann Heman for sick sinus syndrome  . Radioactive seed implant  05/04/2004    8000 cGy, Dr Danny Lawless Dr Reece Agar  . Elbow surgery Bilateral 1973  . Coronary angioplasty    . Mediastinoscopy N/A 01/28/2014    Procedure: MEDIASTINOSCOPY;  Surgeon: Melrose Nakayama, MD;  Location: [facility];  Service: Thoracic;  Laterality: N/A;  . Permanent pacemaker insertion N/A 02/26/2013    Procedure: PERMANENT PACEMAKER INSERTION;  Surgeon: [name], MD;  Location: [facility] CATH [facility];  Service: Cardiovascular;  Laterality: N/A;  . Insert / replace / remove pacemaker      not removed  . Craniotomy N/A 12/04/2014    Procedure: CRANIOTOMY TUMOR EXCISION, LEFT;  Surgeon: [name], MD;  Location: [facility];  Service: Neurosurgery;  Laterality: N/A;  CRANIOTOMY TUMOR EXCISION  . Application of cranial navigation  12/04/2014    Procedure: APPLICATION OF CRANIAL NAVIGATION;  Surgeon: [name], MD;  Location: [facility];  Service: Neurosurgery;;    There were no vitals filed for this visit.  Visit Diagnosis:  Abnormality of gait  Muscle  weakness of lower extremity      Subjective Assessment - 03/12/15 1408    Subjective Still having RLE pain at night.  Still walking him up.   Patient Stated Goals Pt wants to get help with talking and with R arm.  Pt in agreement with balance assessment and walking.   Currently in Pain? Yes   Pain Score 5    Pain Location Leg   Pain Orientation Right   Pain Descriptors / Indicators Aching   Pain Type Chronic pain   Pain Onset More than a month ago   Pain Frequency Constant   Aggravating Factors  unsure   Pain Relieving Factors unsure                         [facility] Adult PT Treatment/Exercise - 03/12/15 1417    Ambulation/Gait   Ambulation/Gait Yes   Ambulation/Gait Assistance 4: Min guard   Assistive device None   Gait Pattern Step-through  pattern;Decreased arm swing - right;Decreased arm swing - left;Decreased weight shift to right;Lateral trunk lean to right;Trendelenburg;Wide base of support   Ambulation Surface Level;Indoor   Gait Comments 3 minute walk test x 476' with SaO2 93% on RA and HR 116;DOE 4/4   Lumbar Exercises: Stretches   Single Knee to Chest Stretch 3 reps;30 seconds   Double Knee to Chest Stretch 3 reps;30 seconds   Lower Trunk Rotation 3 reps;30 seconds   Knee/Hip Exercises: Standing   Heel Raises Both;10 reps   Knee Flexion Both;10 reps   Hip Flexion Both;10 reps   Hip Abduction Both;10 reps   Hip Extension 1 set;10 reps;Right;Left;Stengthening   Other Standing Knee Exercises requires seated rest breaks during session                PT Education - 03/12/15 1746    Education provided Yes   Education Details HEP, walking program   Person(s) Educated Patient   Methods Explanation;Demonstration;Handout   Comprehension Verbalized understanding          PT Short Term Goals - 02/12/15 1417    PT SHORT TERM GOAL #1   Title Pt will perform HEP with family supervision for improved strength, balance, mobility.  Target 03/14/15   Time 4   Period Weeks   Status New   PT SHORT TERM GOAL #2   Title Pt will improve Berg Balance score to at least 37/56 for decreased fall risk.   Baseline 32/56 at eval   Time 4   Period Weeks   Status New   PT SHORT TERM GOAL #3   Title Pt will improve TUG score to less than or equal to 13.5 seconds for decreased fall risk.   Baseline 13.6 sec at eval   Time 4   Period Weeks   Status New   PT SHORT TERM GOAL #4   Title Pt will ambulate at least 200 ft. using least restrictive assistive device (cane vs. RW) modified independently, for improved functional mobility.   Time 4   Period Weeks   Status New           PT Long Term Goals - 02/12/15 1419    PT LONG TERM GOAL #1   Title Pt/family will verbalize understanding of fall prevention within home  environment.  TARGET 04/14/15   Time 8   Period Weeks   Status New   PT LONG TERM GOAL #2   Title Pt will improve Berg Balance score to  at least 42/56 for decreased fall risk.   Time 8   Period Weeks   Status New   PT LONG TERM GOAL #3   Title Pt will improve gait velocity to at least 2.62 ft/sec for improved gait efficiency and safety.   Time 8   Period Weeks   Status New   PT LONG TERM GOAL #4   Title Pt will ambulate at least 500 ft, indoor and outdoor surfaces, modified independently, for improved functional mobility.   Time 8   Period Weeks   Status New   PT LONG TERM GOAL #5   Title Pt will verbalize plans for continued community fitness upon D/C from PT.   Time 8   Status New               Plan - 03/12/15 1748    Clinical Impression Statement Pt tolerated increased activity today.  Did c/o some back tightness after amb and standing exercises but improved after supine stretches.  Continue PT per POC.   Pt will benefit from skilled therapeutic intervention in order to improve on the following deficits Abnormal gait;Decreased activity tolerance;Decreased balance;Decreased mobility;Decreased safety awareness;Decreased strength;Difficulty walking   Rehab Potential Good   PT Frequency 2x / week   PT Duration 8 weeks   PT Treatment/Interventions ADLs/Self Care Home Management;Therapeutic exercise;Therapeutic activities;Functional mobility training;Gait training;Patient/family education;DME Instruction;Neuromuscular re-education;Balance training   PT Next Visit Plan Follow up on walking program and HEP.  Continue endurance and strengthening.   Consulted and Agree with Plan of Care Patient        Problem List Patient Active Problem List   Diagnosis Date Noted  . Hyperbilirubinemia 01/17/2015  . DVT (deep venous thrombosis) 01/17/2015  . Sensation of cold in leg 01/06/2015  . Pain of left leg 01/06/2015  . Metastasis to brain 12/04/2014  . Brain metastasis  11/17/2014  . Encounter for antineoplastic immunotherapy 11/17/2014  . Other pancytopenia   . Acute respiratory failure with hypoxia   . Antineoplastic chemotherapy induced pancytopenia   . SOB (shortness of breath) 07/21/2014  . CAP (community acquired pneumonia) 07/21/2014  . Neoplasm related pain 06/18/2014  . Anemia in neoplastic disease 06/04/2014  . Dyspnea 06/02/2014  . Acute right hip pain 05/26/2014  . Non-small cell carcinoma of lung, stage 4 01/30/2014  . Nodule of right lung 01/21/2014  . Mediastinal adenopathy 01/21/2014  . Prostate cancer   . History of radiation therapy   . 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 Sick sinus syndrome 04/16/2013  . Cardiac arrhythmia 07/12/2011  . COPD GOLD II 08/27/2010  . Abdominal aortic aneurysm 05/14/2008  . ACTINIC KERATOSIS, HEAD 02/11/2008  . LOSS, CONDUCTIVE HEARING, BILATERAL 01/18/2007  . HLD (hyperlipidemia) 12/25/2006  . Essential hypertension 12/25/2006  . Coronary atherosclerosis 12/25/2006    Narda Bonds 03/12/2015, 5:51 PM  [facility] [address] [place], [place], [address] Phone: [phone]   Fax:  [place], [place] 03/12/2015 5:51 PM Phone: [phone] Fax: [phone]

## 2015-03-13 ENCOUNTER — Ambulatory Visit (HOSPITAL_COMMUNITY)
Admission: RE | Admit: 2015-03-13 | Discharge: 2015-03-13 | Disposition: A | Discharge status: Home / Self Care | Payer: Medicare Other | Source: Ambulatory Visit | Attending: Radiation Oncology | Admitting: Radiation Oncology

## 2015-03-13 ENCOUNTER — Other Ambulatory Visit: Payer: Self-pay | Admitting: Radiation Oncology

## 2015-03-13 DIAGNOSIS — Z950 Presence of cardiac pacemaker: Secondary | ICD-10-CM | POA: Insufficient documentation

## 2015-03-13 DIAGNOSIS — I10 Essential (primary) hypertension: Secondary | ICD-10-CM | POA: Insufficient documentation

## 2015-03-13 DIAGNOSIS — C7931 Secondary malignant neoplasm of brain: Secondary | ICD-10-CM | POA: Diagnosis not present

## 2015-03-13 DIAGNOSIS — E785 Hyperlipidemia, unspecified: Secondary | ICD-10-CM | POA: Insufficient documentation

## 2015-03-13 DIAGNOSIS — C61 Malignant neoplasm of prostate: Secondary | ICD-10-CM | POA: Insufficient documentation

## 2015-03-13 DIAGNOSIS — H0520 Unspecified exophthalmos: Secondary | ICD-10-CM | POA: Insufficient documentation

## 2015-03-13 DIAGNOSIS — C3490 Malignant neoplasm of unspecified part of unspecified bronchus or lung: Secondary | ICD-10-CM | POA: Diagnosis not present

## 2015-03-13 DIAGNOSIS — Z923 Personal history of irradiation: Secondary | ICD-10-CM | POA: Insufficient documentation

## 2015-03-13 DIAGNOSIS — G319 Degenerative disease of nervous system, unspecified: Secondary | ICD-10-CM | POA: Insufficient documentation

## 2015-03-13 MED ORDER — IOHEXOL 300 MG/ML  SOLN
100.0000 mL | Freq: Once | INTRAMUSCULAR | Status: AC | PRN
  Administered 2015-03-13: 100 mL via INTRAVENOUS

## 2015-03-16 ENCOUNTER — Encounter: Payer: Self-pay | Admitting: Radiation Oncology

## 2015-03-16 ENCOUNTER — Ambulatory Visit
Admission: RE | Admit: 2015-03-16 | Discharge: 2015-03-16 | Disposition: A | Discharge status: Home / Self Care | Payer: Medicare Other | Source: Ambulatory Visit | Attending: Radiation Oncology | Admitting: Radiation Oncology

## 2015-03-16 VITALS — BP 120/79 | HR 77 | Temp 97.7°F | Resp 20 | Wt 173.2 lb

## 2015-03-16 DIAGNOSIS — C7931 Secondary malignant neoplasm of brain: Secondary | ICD-10-CM

## 2015-03-16 NOTE — Progress Notes (Signed)
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  Interval Since Last Radiation:  4  months  Narrative:  The patient returns today for routine follow-up.  Weight and vitals stable. Denies pain. Shortness of breath at rest noted. Reports he has oxygen therapy at home but, rarely uses it. Reports occasional episodes of dyspnea. Taking xarelto daily to manage hx of blood clots. Ambulating with the aid of a cane. Denies headache, dizziness, nausea, vomiting, diplopia or ringing in the ears. Denies taking decadron at this time. Reports difficulty swallowing. Concerned about a "knot" he felt in the right side of his neck. Patient frustrates easily and yells when he can't get his words out. Wife reports outpatient occupational, speech and physical therapy continue twice per week. He has been experiencing leg pain. He takes oxycodone to reduce the pain and be able to sleep. He is not currently taking steroids.                 ALLERGIES:  is allergic to oxycodone hcl.  Meds: Current Outpatient Prescriptions  Medication Sig Dispense Refill  . albuterol (PROVENTIL,VENTOLIN) 90 MCG/ACT inhaler Inhale 2 puffs into the lungs every 6 (six) hours as needed for wheezing or shortness of breath.    . budesonide-formoterol (SYMBICORT) 160-4.5 MCG/ACT inhaler Inhale 2 puffs into the lungs 2 (two) times daily. (Patient taking differently: Inhale 1 puff into the lungs 2 (two) times daily. )    . famotidine (PEPCID) 20 MG tablet Take 20 mg by mouth at bedtime.    . furosemide (LASIX) 20 MG tablet TAKE 1 TABLET (20 MG TOTAL) BY MOUTH DAILY. AS NEEDED FOR SWELLING. 30 tablet 0  . mirtazapine  (REMERON) 30 MG tablet Take 1 tablet (30 mg total) by mouth at bedtime. 30 tablet 2  . Multiple Minerals (CALCIUM-MAGNESIUM-ZINC) TABS Take 1 tablet by mouth daily.    . Multiple Vitamin (MULTIVITAMIN WITH MINERALS) TABS tablet Take 1 tablet by mouth daily.    . nitroGLYCERIN (NITROSTAT) 0.4 MG SL tablet Place 0.4 mg under the tongue every 5 (five) minutes as needed for chest pain (MAX 3 TABLETS).    [name] omeprazole (PRILOSEC) 40 MG capsule Take 40 mg by mouth daily before supper.   0  . oxyCODONE (OXY IR/ROXICODONE) 5 MG immediate release tablet Take 1 tablet (5 mg total) by mouth every 6 (six) hours as needed for severe pain. 30 tablet 0  . prochlorperazine (COMPAZINE) 10 MG tablet Take 10 mg by mouth every 6 (six) hours as needed for nausea or vomiting.    . Rivaroxaban (XARELTO STARTER PACK) 15 & 20 MG TBPK Take as directed on package: Start with one '15mg'$  tablet by mouth twice a day with food. On Day 22, switch to one '20mg'$  tablet once a day with food. 51 each 0  . Sennosides (EX-LAX) 15 MG CHEW Chew 15 mg by mouth at bedtime.    . tamsulosin (FLOMAX) 0.4 MG CAPS capsule Take 0.4 mg by mouth at bedtime.   2   No current facility-administered medications for this encounter.   Facility-Administered Medications Ordered in Other Encounters  Medication Dose Route Frequency Provider Last Rate  Last Dose  . heparin lock flush 100 unit/mL  500 Units Intracatheter Daily PRN Carlton Adam, PA-C      . sodium chloride 0.9 % injection 10 mL  10 mL Intracatheter PRN Adrena E Johnson, PA-C      . sodium chloride 0.9 % injection 3 mL  3 mL Intracatheter PRN Carlton Adam, PA-C        Physical Findings: The patient is in no acute distress. Patient is alert and oriented.  vitals were not taken for this visit..  No significant changes.  HENT: Neck is free of any lymph nodes. Oropharynx is clear. No thrush noted. Patient shows some signs of aphasia.   Lab Findings: Lab Results  Component Value Date    WBC 4.4 03/10/2015   WBC 2.2 Repeated and verified X2.* 12/29/2014   HGB 9.4* 03/10/2015   HGB 10.6* 12/29/2014   HCT 29.7* 03/10/2015   HCT 31.3* 12/29/2014   PLT 188 03/10/2015   PLT 59.0 Repeated and verified X2.* 12/29/2014    Lab Results  Component Value Date   NA 142 03/10/2015   NA 135 11/28/2014   K 3.9 03/10/2015   K 4.8 11/28/2014   CHLORIDE 107 03/10/2015   CO2 27 03/10/2015   CO2 25 11/28/2014   GLUCOSE 92 03/10/2015   GLUCOSE 111* 11/28/2014   BUN 8.9 03/10/2015   BUN 26* 11/28/2014   CREATININE 0.7 03/10/2015   CREATININE 0.73 11/28/2014   BILITOT 0.53 03/10/2015   BILITOT 1.2 07/21/2014   ALKPHOS 72 03/10/2015   ALKPHOS 66 07/21/2014   AST 10 03/10/2015   AST 43* 07/21/2014   ALT 9 03/10/2015   ALT 37 07/21/2014   PROT 6.3* 03/10/2015   PROT 6.8 07/21/2014   ALBUMIN 2.9* 03/10/2015   ALBUMIN 3.0* 07/21/2014   CALCIUM 9.7 03/10/2015   CALCIUM 9.6 11/28/2014   ANIONGAP 8 03/10/2015   ANIONGAP 9 11/28/2014    Impression:  The patient is recovering from the effects of radiation and craniotomy.    Plan:  The patient is scheduled for a chest and abdomen/pelvis CT on 10/14. The patient continues to receive immunotherapy with Dr. Julien Nordmann. We will repeat head CT scans in 3 months with follow up.   This document serves as a record of services personally performed by [name], MD. It was created on his behalf by Arlyce Harman, a trained medical scribe. The creation of this record is based on the scribe's personal observations and the provider's statements to them. This document has been checked and approved by the attending provider.    _____________________________________  Sheral Apley. Tammi Klippel, M.D.

## 2015-03-16 NOTE — Progress Notes (Signed)
Weight and vitals stable. Denies pain. Shortness of breath at rest noted. Reports he has oxygen therapy at home but, rarely uses it. Reports occasional episodes of dyspnea. Taking xarelto daily to manage hx of blood clots. Ambulating with the aid of a cane. Denies headache, dizziness, nausea, vomiting, diplopia or ringing in the ears. Denies taking decadron at this time. Reports difficulty swallowing. Concerned about a "knot" he felt in the right side of his neck. Patient frustrates easily and yells when he can't get his words out. Wife reports outpatient occupational, speech and physical therapy continue twice per week.   BP 120/79 mmHg  Pulse 77  Temp(Src) 97.7 F (36.5 C) (Oral)  Resp 20  Wt 173 lb 3.2 oz (78.563 kg)  SpO2 100% Wt Readings from Last 3 Encounters:  03/16/15 173 lb 3.2 oz (78.563 kg)  03/10/15 172 lb 3.2 oz (78.109 kg)  03/03/15 176 lb (79.833 kg)

## 2015-03-17 ENCOUNTER — Ambulatory Visit: Payer: Medicare Other

## 2015-03-17 ENCOUNTER — Encounter: Payer: Self-pay | Admitting: Occupational Therapy

## 2015-03-17 ENCOUNTER — Other Ambulatory Visit: Payer: Self-pay | Admitting: Radiation Therapy

## 2015-03-17 ENCOUNTER — Ambulatory Visit: Payer: Medicare Other | Attending: Internal Medicine | Admitting: Physical Therapy

## 2015-03-17 ENCOUNTER — Ambulatory Visit: Payer: Medicare Other | Admitting: Occupational Therapy

## 2015-03-17 DIAGNOSIS — R4701 Aphasia: Secondary | ICD-10-CM

## 2015-03-17 DIAGNOSIS — R29898 Other symptoms and signs involving the musculoskeletal system: Secondary | ICD-10-CM

## 2015-03-17 DIAGNOSIS — M6289 Other specified disorders of muscle: Secondary | ICD-10-CM | POA: Diagnosis present

## 2015-03-17 DIAGNOSIS — R279 Unspecified lack of coordination: Secondary | ICD-10-CM | POA: Diagnosis present

## 2015-03-17 DIAGNOSIS — C7931 Secondary malignant neoplasm of brain: Secondary | ICD-10-CM

## 2015-03-17 DIAGNOSIS — R6889 Other general symptoms and signs: Secondary | ICD-10-CM | POA: Diagnosis present

## 2015-03-17 DIAGNOSIS — M6281 Muscle weakness (generalized): Secondary | ICD-10-CM | POA: Insufficient documentation

## 2015-03-17 DIAGNOSIS — R269 Unspecified abnormalities of gait and mobility: Secondary | ICD-10-CM | POA: Insufficient documentation

## 2015-03-17 NOTE — Therapy (Signed)
[facility] [address] [place], [place], [address] Phone: [phone]   Fax:  [phone]  Occupational Therapy Treatment  Patient Details  Name: Nicholas Schmitt MRN: [id number] Date of Birth: 05/10/1936 Referring Provider:  Dorena Cookey, MD  Encounter Date: 03/17/2015      OT End of Session - 03/17/15 1141    Visit Number 7   Number of Visits 17   Date for OT Re-Evaluation 04/13/15   Authorization Type Blue MCR - G code   Authorization - Visit Number 7   Authorization - Number of Visits 10   OT Start Time 1105   OT Stop Time 1145   OT Time Calculation (min) 40 min   Equipment Utilized During Treatment UBE      Past Medical History  Diagnosis Date  . CAD (coronary artery disease)     positive stress test in 2006 led to left heart cath (8/06) showing 95% prox RCA, 90% CFX, and 90% mLAD.  patient had a cypher DES to all 3 lesions  . Hyperlipidemia   . Hearing loss   . AAA (abdominal aortic aneurysm) ([place]) 11/09    3.2 cm   . Chronic renal insufficiency   . History of radiation therapy 03/08/04- 04/08/04    prostate 4600 cGy in 3 fractions, radioactive seed implant 05/04/04  . Myocardial infarction ([place]) 1995  . Pacemaker 2014  . Shortness of breath     exertion  . COPD (chronic obstructive pulmonary disease) ([place])   . Radiation Jan.11-Jan 29/2016    Right central chest 35 gray in 14 fractions  . Radiation Jan.2016    35 gray in 14 fx lower lumbar/upper sacrum  . Diverticulosis   . Hemorrhoids   . Pneumonia     hx  . GERD (gastroesophageal reflux disease)   . Hypertension     no med now for 3-4 months  . Cancer [facility]) 2015    prostate   . Prostate cancer ([place]) 2005    gleason 7  . Skin cancer     ear  . Lung cancer ([place])   . Cancer [facility]) December 04, 2014    Cranial     Past Surgical History  Procedure Laterality Date  . Ptca  2006  . Coronary stent placement    . Permanent pacemaker  insertion Bilateral 02/26/13    MDT Adapta L implanted by Dr Rayann Heman for sick sinus syndrome  . Radioactive seed implant  05/04/2004    8000 cGy, Dr Danny Lawless Dr Reece Agar  . Elbow surgery Bilateral 1973  . Coronary angioplasty    . Mediastinoscopy N/A 01/28/2014    Procedure: MEDIASTINOSCOPY;  Surgeon: Melrose Nakayama, MD;  Location: [facility];  Service: Thoracic;  Laterality: N/A;  . Permanent pacemaker insertion N/A 02/26/2013    Procedure: PERMANENT PACEMAKER INSERTION;  Surgeon: [name], MD;  Location: [facility] CATH LAB;  Service: Cardiovascular;  Laterality: N/A;  . Insert / replace / remove pacemaker      not removed  . Craniotomy N/A 12/04/2014    Procedure: CRANIOTOMY TUMOR EXCISION, LEFT;  Surgeon: [name], MD;  Location: [facility] NEURO ORS;  Service: Neurosurgery;  Laterality: N/A;  CRANIOTOMY TUMOR EXCISION  . Application of cranial navigation  12/04/2014    Procedure: APPLICATION OF CRANIAL NAVIGATION;  Surgeon: [name], MD;  Location: [facility] NEURO ORS;  Service: Neurosurgery;;    There were no vitals filed for this visit.  Visit Diagnosis:  Weakness of  right arm  Lack of coordination  Weakness of right hand      Subjective Assessment - 03/17/15 1106    Limitations DVT RLE (on active treatment), RUE clots (on active treatment), *active CA with chemo treatments   Patient Stated Goals get my dominant hand working good   Currently in Pain? Yes  O.T. not addressing Rt leg pain            [facility] OT Assessment - 03/17/15 0001    Coordination   Right 9 Hole Peg Test 68.16 sec.    Hand Function   Right Hand Grip (lbs) 35 lbs                  OT Treatments/Exercises (OP) - 03/17/15 0001    ADLs   Writing Pt writing name 5 times with built up pen with approx. 90% legibility (Pt has foam for pen at home)   ADL Comments Assessed remaining STG's - see assessment. Pt made improvements in coordination and grip strength   Shoulder Exercises: ROM/Strengthening    UBE (Upper Arm Bike) UBE x 6 min. Level 3 for strength/endurance   Hand Exercises   Other Hand Exercises Gripper set at 35 lbs. resistance to pick up blocks for sustained grip strength Rt hand with min difficulty and drops.    Fine Motor Coordination   Small Pegboard Pt placing small pegs in pegboard Rt hand for coordination with min to mod difficulty/drops and occasional assist required from Lt hand to manipulate peg for placement. Pt copied design for cognitive component with 100% accuracy (with some color discrepancy b/t blue and purple)                  OT Short Term Goals - 03/17/15 1116    OT SHORT TERM GOAL #1   Title Independent with coordination and putty HEP for Rt hand (due 03/14/15)   Time 4   Period Weeks   Status Achieved   OT SHORT TERM GOAL #2   Title Improve grip strength by 10 lbs or greater Rt hand    Baseline Rt = 30 (Lt = 62 lbs)   Time 4   Period Weeks   Status Not Met  03/17/15: 35 lbs   OT SHORT TERM GOAL #3   Title Improve coordination as evidenced by performing 9 hole peg test in 90 sec or less   Baseline 102.85 sec.    Time 4   Period Weeks   Status Achieved  03/17/15: 68.16 sec.    OT SHORT TERM GOAL #4   Title Pt to write name at 90% or greater legibility   Baseline 75%   Time 4   Period Weeks   Status Achieved           OT Long Term Goals - 02/12/15 1522    OT LONG TERM GOAL #1   Title Independent w/ strengthening HEP for RUE - due 04/13/15   Time 8   Period Weeks   Status New   OT LONG TERM GOAL #2   Title Improve grip strength to 50 lbs or greater Rt hand   Baseline eval = 30 lbs   Time 8   Period Weeks   Status New   OT LONG TERM GOAL #3   Title Improve coordination as evidenced by performing 9 hole peg test in 75 sec. or less   Baseline eval = 102.85 sec.    Time 8   Period Weeks   Status  New   OT LONG TERM GOAL #4   Title Pt to write check at 90% legibility or greater   Baseline eval = 75% name only   Time 8    Period Weeks   Status New   OT LONG TERM GOAL #5   Title Pt to prepare simple meal (stovetop) with distant supervision safely   Time 8   Period Weeks   Status New               Plan - 03/17/15 1141    Clinical Impression Statement Pt with improved coordination and slightly improved grip strength. Pt met STG's #1, 3, 4. STG #2 not met but did improve.    Plan continue UE strength/endurance, practice writing   OT Home Exercise Plan education issued:  02/24/15 coordination/red putty HEP; added coordination ex on 02/25/15. Theraband HEP on 03/05/15   Consulted and Agree with Plan of Care Patient        Problem List Patient Active Problem List   Diagnosis Date Noted  . Hyperbilirubinemia 01/17/2015  . DVT (deep venous thrombosis) ([place]) 01/17/2015  . Sensation of cold in leg 01/06/2015  . Pain of left leg 01/06/2015  . Metastasis to brain ([place]) 12/04/2014  . Brain metastasis ([place]) 11/17/2014  . Encounter for antineoplastic immunotherapy 11/17/2014  . Other pancytopenia ([place])   . Acute respiratory failure with hypoxia ([place])   . Antineoplastic chemotherapy induced pancytopenia ([place])   . SOB (shortness of breath) 07/21/2014  . CAP (community acquired pneumonia) 07/21/2014  . Neoplasm related pain 06/18/2014  . Anemia in neoplastic disease 06/04/2014  . Dyspnea 06/02/2014  . Acute right hip pain 05/26/2014  . Non-small cell carcinoma of lung, stage 4 ([place]) 01/30/2014  . Nodule of right lung 01/21/2014  . Mediastinal adenopathy 01/21/2014  . Prostate cancer ([place])   . History of radiation therapy   . 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 Sick sinus syndrome ([place]) 04/16/2013  . Cardiac arrhythmia 07/12/2011  . COPD GOLD II 08/27/2010  . Abdominal aortic aneurysm ([place]) 05/14/2008  . ACTINIC KERATOSIS, HEAD 02/11/2008  . LOSS, CONDUCTIVE HEARING, BILATERAL 01/18/2007  . HLD (hyperlipidemia) 12/25/2006  . Essential hypertension 12/25/2006  . Coronary  atherosclerosis 12/25/2006    Carey Bullocks, OTR/L 03/17/2015, 11:44 AM  [facility] [address] [place], [place], [address] Phone: [phone]   Fax:  [phone]

## 2015-03-17 NOTE — Therapy (Signed)
[facility] [address] [place], [place], [address] Phone: [phone]   Fax:  [phone]  Speech Language Pathology Treatment  Patient Details  Name: Nicholas Schmitt MRN: [id number] Date of Birth: [birth year] Referring Provider:  Dorena Cookey, MD  Encounter Date: 03/17/2015      End of Session - 03/17/15 1056    Visit Number 7   Number of Visits 16   Date for SLP Re-Evaluation 04/13/15   SLP Start Time 36   SLP Stop Time  1100   SLP Time Calculation (min) 41 min   Activity Tolerance Patient tolerated treatment well      Past Medical History  Diagnosis Date  . CAD (coronary artery disease)     positive stress test in 2006 led to left heart cath (8/06) showing 95% prox RCA, 90% CFX, and 90% mLAD.  patient had a cypher DES to all 3 lesions  . Hyperlipidemia   . Hearing loss   . AAA (abdominal aortic aneurysm) ([place]) 11/09    3.2 cm   . Chronic renal insufficiency   . History of radiation therapy 03/08/04- 04/08/04    prostate 4600 cGy in 3 fractions, radioactive seed implant 05/04/04  . Myocardial infarction ([place]) 1995  . Pacemaker 2014  . Shortness of breath     exertion  . COPD (chronic obstructive pulmonary disease) ([place])   . Radiation Jan.11-Jan 29/2016    Right central chest 35 gray in 14 fractions  . Radiation Jan.2016    35 gray in 14 fx lower lumbar/upper sacrum  . Diverticulosis   . Hemorrhoids   . Pneumonia     hx  . GERD (gastroesophageal reflux disease)   . Hypertension     no med now for 3-4 months  . Cancer [facility]) 2015    prostate   . Prostate cancer ([place]) 2005    gleason 7  . Skin cancer     ear  . Lung cancer ([place])   . Cancer [facility]) December 04, 2014    Cranial     Past Surgical History  Procedure Laterality Date  . Ptca  2006  . Coronary stent placement    . Permanent pacemaker insertion Bilateral 02/26/13    MDT Adapta L implanted by Dr Rayann Heman for sick sinus syndrome  .  Radioactive seed implant  05/04/2004    8000 cGy, Dr Danny Lawless Dr Reece Agar  . Elbow surgery Bilateral 1973  . Coronary angioplasty    . Mediastinoscopy N/A 01/28/2014    Procedure: MEDIASTINOSCOPY;  Surgeon: Melrose Nakayama, MD;  Location: [facility];  Service: Thoracic;  Laterality: N/A;  . Permanent pacemaker insertion N/A 02/26/2013    Procedure: PERMANENT PACEMAKER INSERTION;  Surgeon: [name], MD;  Location: [facility] CATH LAB;  Service: Cardiovascular;  Laterality: N/A;  . Insert / replace / remove pacemaker      not removed  . Craniotomy N/A 12/04/2014    Procedure: CRANIOTOMY TUMOR EXCISION, LEFT;  Surgeon: [name], MD;  Location: [facility] NEURO ORS;  Service: Neurosurgery;  Laterality: N/A;  CRANIOTOMY TUMOR EXCISION  . Application of cranial navigation  12/04/2014    Procedure: APPLICATION OF CRANIAL NAVIGATION;  Surgeon: [name], MD;  Location: [facility] NEURO ORS;  Service: Neurosurgery;;    There were no vitals filed for this visit.  Visit Diagnosis: Expressive aphasia  Receptive aphasia             ADULT SLP TREATMENT - 03/17/15 1036  General Information   Behavior/Cognition Alert;Cooperative;Pleasant mood   Treatment Provided   Treatment provided Cognitive-Linquistic   Pain Assessment   Pain Assessment No/denies pain   Cognitive-Linquistic Treatment   Treatment focused on Aphasia   Skilled Treatment SLP facilitated work with pt today to address his expressive aphasia. Confrontation naming with 4-5 attempts (clothing) 70% success (20% first attempt), less-common items (camera, flower pt, toaster, string, etc) with 35% success initial attempt (60% with 4-5 attempts allowed) benefitted from phonemic and spelling  cues moreso than semantic cues.  In responsive naming tasks pt was 60% successful, and 90% when given three attempts.   Assessment / Recommendations / Plan   Plan Continue with current plan of care   Progression Toward Goals   Progression toward goals  Progressing toward goals            SLP Short Term Goals - 03/17/15 1057    SLP SHORT TERM GOAL #1   Title pt will complete simple naming tasks with 85% success, functionally   Status Achieved   SLP SHORT TERM GOAL #2   Title pt will demo understanding of mod complex linguistic stimuli (directions with before/after, yes/no questions, etc) 75% with occasional min A   Time 1   Period Weeks   Status On-going   SLP SHORT TERM GOAL #3   Title pt will demo understanding of 3-4 sentence paragraphs (longer verbal stimuli) by answering yes/no or "wh" questions with 80% success and occasional min cues   Time 1   Period Weeks   Status On-going   SLP SHORT TERM GOAL #4   Title pt will write name and address without signs of aphasia   Time 1   Period Weeks   Status On-going          SLP Long Term Goals - 03/17/15 1057    SLP LONG TERM GOAL #1   Title pt will demo understanding of functional 10 minutes simple to mod complex conversation with request for repeats allowed   Time 5   Period Weeks   Status On-going   SLP LONG TERM GOAL #2   Title pt will participate in 10 minutes simple conversation with compensations with rare min questioning cues   Time 5   Period Weeks   Status On-going   SLP LONG TERM GOAL #3   Title pt will write simple functional lists with rare min A for aphasic errors   Time 5   Period Weeks   Status On-going          Plan - 03/17/15 1056    Clinical Impression Statement Pt cont with receptive and expressive aphasia due to mets to brain and subsequent craniotomy. ST to cont to improve receptive and expressive language in order to miinimize caregiver burden of conversation and improve communcation between pt and friends/family.   Speech Therapy Frequency 2x / week   Duration --  5 weeks   Treatment/Interventions Compensatory strategies;SLP instruction and feedback;Patient/family education;Functional tasks;Cueing hierarchy;Language facilitation    Potential to Achieve Goals Fair   Potential Considerations Co-morbidities        Problem List Patient Active Problem List   Diagnosis Date Noted  . Hyperbilirubinemia 01/17/2015  . DVT (deep venous thrombosis) ([place]) 01/17/2015  . Sensation of cold in leg 01/06/2015  . Pain of left leg 01/06/2015  . Metastasis to brain ([place]) 12/04/2014  . Brain metastasis ([place]) 11/17/2014  . Encounter for antineoplastic immunotherapy 11/17/2014  . Other pancytopenia ([place])   . Acute  respiratory failure with hypoxia ([place])   . Antineoplastic chemotherapy induced pancytopenia ([place])   . SOB (shortness of breath) 07/21/2014  . CAP (community acquired pneumonia) 07/21/2014  . Neoplasm related pain 06/18/2014  . Anemia in neoplastic disease 06/04/2014  . Dyspnea 06/02/2014  . Acute right hip pain 05/26/2014  . Non-small cell carcinoma of lung, stage 4 ([place]) 01/30/2014  . Nodule of right lung 01/21/2014  . Mediastinal adenopathy 01/21/2014  . Prostate cancer ([place])   . History of radiation therapy   . 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 Sick sinus syndrome ([place]) 04/16/2013  . Cardiac arrhythmia 07/12/2011  . COPD GOLD II 08/27/2010  . Abdominal aortic aneurysm ([place]) 05/14/2008  . ACTINIC KERATOSIS, HEAD 02/11/2008  . LOSS, CONDUCTIVE HEARING, BILATERAL 01/18/2007  . HLD (hyperlipidemia) 12/25/2006  . Essential hypertension 12/25/2006  . Coronary atherosclerosis 12/25/2006    [facility] , [place], CCC-SLP  03/17/2015, 11:00 AM  [facility] [address] [place], [place], [address] Phone: [phone]   Fax:  [phone]

## 2015-03-17 NOTE — Therapy (Signed)
[facility] [address] [place], [place], [address] Phone: [phone]   Fax:  [phone]  Physical Therapy Treatment  Patient Details  Name: Nicholas Schmitt MRN: [id number] Date of Birth: 1936-05-27 Referring Provider:  Dorena Cookey, MD  Encounter Date: 03/17/2015      PT End of Session - 03/17/15 1257    Visit Number 6   Number of Visits 17   Date for PT Re-Evaluation 04/14/15   Authorization Type BCBS Medicare-G-code required every 10th visit   PT Start Time 1150   PT Stop Time 1229   PT Time Calculation (min) 39 min   Activity Tolerance Patient tolerated treatment well   Behavior During Therapy [facility] for tasks assessed/performed      Past Medical History  Diagnosis Date  . CAD (coronary artery disease)     positive stress test in 2006 led to left heart cath (8/06) showing 95% prox RCA, 90% CFX, and 90% mLAD.  patient had a cypher DES to all 3 lesions  . Hyperlipidemia   . Hearing loss   . AAA (abdominal aortic aneurysm) ([place]) 11/09    3.2 cm   . Chronic renal insufficiency   . History of radiation therapy 03/08/04- 04/08/04    prostate 4600 cGy in 3 fractions, radioactive seed implant 05/04/04  . Myocardial infarction ([place]) 1995  . Pacemaker 2014  . Shortness of breath     exertion  . COPD (chronic obstructive pulmonary disease) ([place])   . Radiation Jan.11-Jan 29/2016    Right central chest 35 gray in 14 fractions  . Radiation Jan.2016    35 gray in 14 fx lower lumbar/upper sacrum  . Diverticulosis   . Hemorrhoids   . Pneumonia     hx  . GERD (gastroesophageal reflux disease)   . Hypertension     no med now for 3-4 months  . Cancer [facility]) 2015    prostate   . Prostate cancer ([place]) 2005    gleason 7  . Skin cancer     ear  . Lung cancer ([place])   . Cancer [facility]) December 04, 2014    Cranial     Past Surgical History  Procedure Laterality Date  . Ptca  2006  . Coronary stent placement    .  Permanent pacemaker insertion Bilateral 02/26/13    MDT Adapta L implanted by Dr Rayann Heman for sick sinus syndrome  . Radioactive seed implant  05/04/2004    8000 cGy, Dr Danny Lawless Dr Reece Agar  . Elbow surgery Bilateral 1973  . Coronary angioplasty    . Mediastinoscopy N/A 01/28/2014    Procedure: MEDIASTINOSCOPY;  Surgeon: Melrose Nakayama, MD;  Location: [facility];  Service: Thoracic;  Laterality: N/A;  . Permanent pacemaker insertion N/A 02/26/2013    Procedure: PERMANENT PACEMAKER INSERTION;  Surgeon: [name], MD;  Location: [facility] CATH LAB;  Service: Cardiovascular;  Laterality: N/A;  . Insert / replace / remove pacemaker      not removed  . Craniotomy N/A 12/04/2014    Procedure: CRANIOTOMY TUMOR EXCISION, LEFT;  Surgeon: [name], MD;  Location: [place] NEURO ORS;  Service: Neurosurgery;  Laterality: N/A;  CRANIOTOMY TUMOR EXCISION  . Application of cranial navigation  12/04/2014    Procedure: APPLICATION OF CRANIAL NAVIGATION;  Surgeon: [name], MD;  Location: [facility] NEURO ORS;  Service: Neurosurgery;;    There were no vitals filed for this visit.  Visit Diagnosis:  Muscle weakness of lower extremity  Abnormality of gait      Subjective Assessment - 03/17/15 1152    Subjective Doing my back exercises, but back still hurting; walking at home, maybe about 3 minutes at a time.   Currently in Pain? Yes   Pain Score 6    Pain Location Back   Pain Orientation Mid;Lower   Pain Descriptors / Indicators Aching   Pain Type Chronic pain   Pain Frequency Intermittent   Aggravating Factors  Unsure   Pain Relieving Factors Medicine        Review of HEP:  -Lumbar exercises given last visit-knee to chest (single and double), trunk rotation  -Seated strengthening:  LAQ x 10 reps, marching in place x 10 reps  -Standing marching x 10 reps, side kicks x 10 reps, hip extension x 10 reps, hamstring curls x 10 reps, heel/toe raises x 10 reps With UE support -With HEP, pt requires verbal  cues of therapist as well as visual cues of HEP on computer screen for performance. -Pt requests several seated rest breaks during exercises (O2 sats 98%, HR 105 bpm) during exercises.  Pt reports 3 therapies in one day is too much (PT is at the end of therapies today).                     [facility] Adult PT Treatment/Exercise - 03/17/15 1214    Transfers   Transfers Sit to Stand;Stand to Sit   Sit to Stand 5: Supervision;With upper extremity assist;From chair/3-in-1;From bed   Stand to Sit 5: Supervision;With upper extremity assist;To bed;To chair/3-in-1   Ambulation/Gait   Ambulation/Gait Yes   Ambulation/Gait Assistance 4: Min guard   Ambulation Distance (Feet) 240 Feet   Assistive device Straight cane   Gait Pattern Step-through pattern;Wide base of support   Ambulation Surface Level;Indoor   Standardized Balance Assessment   Standardized Balance Assessment Berg Balance Test;Timed Up and Go Test   Berg Balance Test   Sit to Stand Able to stand  independently using hands   Standing Unsupported Able to stand 2 minutes with supervision   Sitting with Back Unsupported but Feet Supported on Floor or Stool Able to sit safely and securely 2 minutes   Stand to Sit Controls descent by using hands   Transfers Able to transfer with verbal cueing and /or supervision   Standing Unsupported with Eyes Closed Able to stand 10 seconds with supervision   Standing Ubsupported with Feet Together Needs help to attain position but able to stand for 30 seconds with feet together   From Standing, Reach Forward with Outstretched Arm Can reach forward >12 cm safely (5")   From Standing Position, Pick up Object from Floor Able to pick up shoe, needs supervision   From Standing Position, Turn to Look Behind Over each Shoulder Looks behind one side only/other side shows less weight shift   Turn 360 Degrees Needs close supervision or verbal cueing  5.86 sec   Standing Unsupported, Alternately Place  Feet on Step/Stool Needs assistance to keep from falling or unable to try   Standing Unsupported, One Foot in Front Able to take small step independently and hold 30 seconds   Standing on One Leg Tries to lift leg/unable to hold 3 seconds but remains standing independently   Total Score 32   Timed Up and Go Test   TUG Normal TUG   Normal TUG (seconds) 12.99  PT Short Term Goals - 03/17/15 1154    PT SHORT TERM GOAL #1   Title Pt will perform HEP with family supervision for improved strength, balance, mobility.  Target 03/14/15   Status Achieved   PT SHORT TERM GOAL #2   Title Pt will improve Berg Balance score to at least 37/56 for decreased fall risk.   Baseline 32/56 at goal check 03/17/15   Status Not Met   PT SHORT TERM GOAL #3   Title Pt will improve TUG score to less than or equal to 13.5 seconds for decreased fall risk.   Baseline 12.99 sec 03/17/15   Status Achieved   PT SHORT TERM GOAL #4   Title Pt will ambulate at least 200 ft. using least restrictive assistive device (cane vs. RW) modified independently, for improved functional mobility.   Baseline 240 ft requires min guard assistance/supervision   Status Partially Met           PT Long Term Goals - 02/12/15 1419    PT LONG TERM GOAL #1   Title Pt/family will verbalize understanding of fall prevention within home environment.  TARGET 04/14/15   Time 8   Period Weeks   Status New   PT LONG TERM GOAL #2   Title Pt will improve Berg Balance score to at least 42/56 for decreased fall risk.   Time 8   Period Weeks   Status New   PT LONG TERM GOAL #3   Title Pt will improve gait velocity to at least 2.62 ft/sec for improved gait efficiency and safety.   Time 8   Period Weeks   Status New   PT LONG TERM GOAL #4   Title Pt will ambulate at least 500 ft, indoor and outdoor surfaces, modified independently, for improved functional mobility.   Time 8   Period Weeks   Status New   PT LONG  TERM GOAL #5   Title Pt will verbalize plans for continued community fitness upon D/C from PT.   Time 8   Status New               Plan - 03/17/15 1259    Clinical Impression Statement Pt has met STG #1 and 3.  STG #4 partially met for gait distance, but pt requires min guard/supervision for safety with gait.  STG #2 not met for improved Berg score.  Pt is progressing slowly with physical therapy, continues to have fatigue; unsure of pt's carryover with exercises at home.  Will continue to benefit from further skilled PT to address strength, balance and gait.     Pt will benefit from skilled therapeutic intervention in order to improve on the following deficits Abnormal gait;Decreased activity tolerance;Decreased balance;Decreased mobility;Decreased safety awareness;Decreased strength;Difficulty walking   Rehab Potential Good   PT Frequency 2x / week   PT Duration 8 weeks   PT Treatment/Interventions ADLs/Self Care Home Management;Therapeutic exercise;Therapeutic activities;Functional mobility training;Gait training;Patient/family education;DME Instruction;Neuromuscular re-education;Balance training   PT Next Visit Plan Gait training with RW; strength/balance training; follow up with family/caregiver for carryover of HEP at home   Consulted and Agree with Plan of Care Patient     Per request in follow-up, try to schedule 2x/wk for at least 3 weeks for PT.  (Pt opted not to schedule today).   Problem List Patient Active Problem List   Diagnosis Date Noted  . Hyperbilirubinemia 01/17/2015  . DVT (deep venous thrombosis) ([place]) 01/17/2015  . Sensation of cold in leg 01/06/2015  .  Pain of left leg 01/06/2015  . Metastasis to brain ([place]) 12/04/2014  . Brain metastasis ([place]) 11/17/2014  . Encounter for antineoplastic immunotherapy 11/17/2014  . Other pancytopenia ([place])   . Acute respiratory failure with hypoxia ([place])   . Antineoplastic chemotherapy induced pancytopenia ([place])   . SOB  (shortness of breath) 07/21/2014  . CAP (community acquired pneumonia) 07/21/2014  . Neoplasm related pain 06/18/2014  . Anemia in neoplastic disease 06/04/2014  . Dyspnea 06/02/2014  . Acute right hip pain 05/26/2014  . Non-small cell carcinoma of lung, stage 4 ([place]) 01/30/2014  . Nodule of right lung 01/21/2014  . Mediastinal adenopathy 01/21/2014  . Prostate cancer ([place])   . History of radiation therapy   . 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 Sick sinus syndrome ([place]) 04/16/2013  . Cardiac arrhythmia 07/12/2011  . COPD GOLD II 08/27/2010  . Abdominal aortic aneurysm ([place]) 05/14/2008  . ACTINIC KERATOSIS, HEAD 02/11/2008  . LOSS, CONDUCTIVE HEARING, BILATERAL 01/18/2007  . HLD (hyperlipidemia) 12/25/2006  . Essential hypertension 12/25/2006  . Coronary atherosclerosis 12/25/2006    Onix Jumper W. 03/17/2015, 1:05 PM  Frazier Butt., PT  [facility] [address] [place], [place], [address] Phone: [phone]   Fax:  [phone]

## 2015-03-17 NOTE — Patient Instructions (Signed)
  Please complete the assigned speech therapy homework prior to your next session.

## 2015-03-19 ENCOUNTER — Ambulatory Visit: Payer: Medicare Other

## 2015-03-19 ENCOUNTER — Ambulatory Visit: Payer: Medicare Other | Admitting: Physical Therapy

## 2015-03-19 ENCOUNTER — Ambulatory Visit: Payer: Medicare Other | Admitting: Occupational Therapy

## 2015-03-19 DIAGNOSIS — R6889 Other general symptoms and signs: Secondary | ICD-10-CM

## 2015-03-19 DIAGNOSIS — R269 Unspecified abnormalities of gait and mobility: Secondary | ICD-10-CM

## 2015-03-19 DIAGNOSIS — R4701 Aphasia: Secondary | ICD-10-CM

## 2015-03-19 DIAGNOSIS — R279 Unspecified lack of coordination: Secondary | ICD-10-CM

## 2015-03-19 DIAGNOSIS — M6281 Muscle weakness (generalized): Secondary | ICD-10-CM | POA: Diagnosis not present

## 2015-03-19 DIAGNOSIS — R29898 Other symptoms and signs involving the musculoskeletal system: Secondary | ICD-10-CM

## 2015-03-19 NOTE — Therapy (Signed)
[facility] [address] [place], [place], [address] Phone: [phone]   Fax:  [phone]  Occupational Therapy Treatment  Patient Details  Name: Nicholas Schmitt MRN: [id number] Date of Birth: Mar 02, 1936 Referring Provider:  Dorena Cookey, MD  Encounter Date: 03/19/2015      OT End of Session - 03/19/15 1501    Visit Number 8   Number of Visits 17   Date for OT Re-Evaluation 04/13/15   Authorization Type Blue MCR - G code   Authorization - Visit Number 8   Authorization - Number of Visits 10   OT Start Time 1400   OT Stop Time 1445   OT Time Calculation (min) 45 min   Equipment Utilized During Treatment UBE   Activity Tolerance Patient tolerated treatment well;Patient limited by fatigue      Past Medical History  Diagnosis Date  . CAD (coronary artery disease)     positive stress test in 2006 led to left heart cath (8/06) showing 95% prox RCA, 90% CFX, and 90% mLAD.  patient had a cypher DES to all 3 lesions  . Hyperlipidemia   . Hearing loss   . AAA (abdominal aortic aneurysm) ([place]) 11/09    3.2 cm   . Chronic renal insufficiency   . History of radiation therapy 03/08/04- 04/08/04    prostate 4600 cGy in 3 fractions, radioactive seed implant 05/04/04  . Myocardial infarction ([place]) 1995  . Pacemaker 2014  . Shortness of breath     exertion  . COPD (chronic obstructive pulmonary disease) ([place])   . Radiation Jan.11-Jan 29/2016    Right central chest 35 gray in 14 fractions  . Radiation Jan.2016    35 gray in 14 fx lower lumbar/upper sacrum  . Diverticulosis   . Hemorrhoids   . Pneumonia     hx  . GERD (gastroesophageal reflux disease)   . Hypertension     no med now for 3-4 months  . Cancer [facility]) 2015    prostate   . Prostate cancer ([place]) 2005    gleason 7  . Skin cancer     ear  . Lung cancer ([place])   . Cancer [facility]) December 04, 2014    Cranial     Past Surgical History  Procedure  Laterality Date  . Ptca  2006  . Coronary stent placement    . Permanent pacemaker insertion Bilateral 02/26/13    MDT Adapta L implanted by Dr Rayann Heman for sick sinus syndrome  . Radioactive seed implant  05/04/2004    8000 cGy, Dr Danny Lawless Dr Reece Agar  . Elbow surgery Bilateral 1973  . Coronary angioplasty    . Mediastinoscopy N/A 01/28/2014    Procedure: MEDIASTINOSCOPY;  Surgeon: Melrose Nakayama, MD;  Location: [facility];  Service: Thoracic;  Laterality: N/A;  . Permanent pacemaker insertion N/A 02/26/2013    Procedure: PERMANENT PACEMAKER INSERTION;  Surgeon: [name], MD;  Location: [facility] CATH LAB;  Service: Cardiovascular;  Laterality: N/A;  . Insert / replace / remove pacemaker      not removed  . Craniotomy N/A 12/04/2014    Procedure: CRANIOTOMY TUMOR EXCISION, LEFT;  Surgeon: [name], MD;  Location: [facility] NEURO ORS;  Service: Neurosurgery;  Laterality: N/A;  CRANIOTOMY TUMOR EXCISION  . Application of cranial navigation  12/04/2014    Procedure: APPLICATION OF CRANIAL NAVIGATION;  Surgeon: [name], MD;  Location: [facility] NEURO ORS;  Service: Neurosurgery;;    There were no  vitals filed for this visit.  Visit Diagnosis:  Lack of coordination  Weakness of right hand  Weakness of right arm      Subjective Assessment - 03/19/15 1408    Limitations DVT RLE (on active treatment), RUE clots (on active treatment), *active CA with chemo treatments   Patient Stated Goals get my dominant hand working good   Currently in Pain? Other (Comment)  Rt foot - O.T. not addressing                      OT Treatments/Exercises (OP) - 03/19/15 0001    ADLs   Writing Practiced writing checks (Pt reports he would like to be able to write check) at approx 75% legibility. Pt initially wrote numerical value (digit format) in the incorrect place and required cues to correct. Pt unable to write out dollar amount due to expressive aphasia, but could copy amount from therapist.     Hand Exercises   Other Hand Exercises Digiflex (green resistance) for mass grasp/release x 20 reps, then at red resistance for tip pinch x 10 reps each to increase hand strength   Fine Motor Coordination   In Hand Manipulation Training Translating stress balls in Rt hand with max difficulty and mod drops. Pt attempting to rotate card around in Rt hand with max difficulty   Dealing card with thumb with mod difficulty, decreased accuracy                  OT Short Term Goals - 03/17/15 1116    OT SHORT TERM GOAL #1   Title Independent with coordination and putty HEP for Rt hand (due 03/14/15)   Time 4   Period Weeks   Status Achieved   OT SHORT TERM GOAL #2   Title Improve grip strength by 10 lbs or greater Rt hand    Baseline Rt = 30 (Lt = 62 lbs)   Time 4   Period Weeks   Status Not Met  03/17/15: 35 lbs   OT SHORT TERM GOAL #3   Title Improve coordination as evidenced by performing 9 hole peg test in 90 sec or less   Baseline 102.85 sec.    Time 4   Period Weeks   Status Achieved  03/17/15: 68.16 sec.    OT SHORT TERM GOAL #4   Title Pt to write name at 90% or greater legibility   Baseline 75%   Time 4   Period Weeks   Status Achieved           OT Long Term Goals - 02/12/15 1522    OT LONG TERM GOAL #1   Title Independent w/ strengthening HEP for RUE - due 04/13/15   Time 8   Period Weeks   Status New   OT LONG TERM GOAL #2   Title Improve grip strength to 50 lbs or greater Rt hand   Baseline eval = 30 lbs   Time 8   Period Weeks   Status New   OT LONG TERM GOAL #3   Title Improve coordination as evidenced by performing 9 hole peg test in 75 sec. or less   Baseline eval = 102.85 sec.    Time 8   Period Weeks   Status New   OT LONG TERM GOAL #4   Title Pt to write check at 90% legibility or greater   Baseline eval = 75% name only   Time 8   Period Weeks  Status New   OT LONG TERM GOAL #5   Title Pt to prepare simple meal (stovetop) with  distant supervision safely   Time 8   Period Weeks   Status New               Plan - 03/19/15 1502    Clinical Impression Statement Pt still with max assist for in hand manipulation. Pt limited by fatigue today   Plan continue UE strength/endurance, coordination   OT Home Exercise Plan education issued:  02/24/15 coordination/red putty HEP; added coordination ex on 02/25/15. Theraband HEP on 03/05/15   Consulted and Agree with Plan of Care Patient        Problem List Patient Active Problem List   Diagnosis Date Noted  . Hyperbilirubinemia 01/17/2015  . DVT (deep venous thrombosis) ([place]) 01/17/2015  . Sensation of cold in leg 01/06/2015  . Pain of left leg 01/06/2015  . Metastasis to brain ([place]) 12/04/2014  . Brain metastasis ([place]) 11/17/2014  . Encounter for antineoplastic immunotherapy 11/17/2014  . Other pancytopenia ([place])   . Acute respiratory failure with hypoxia ([place])   . Antineoplastic chemotherapy induced pancytopenia ([place])   . SOB (shortness of breath) 07/21/2014  . CAP (community acquired pneumonia) 07/21/2014  . Neoplasm related pain 06/18/2014  . Anemia in neoplastic disease 06/04/2014  . Dyspnea 06/02/2014  . Acute right hip pain 05/26/2014  . Non-small cell carcinoma of lung, stage 4 ([place]) 01/30/2014  . Nodule of right lung 01/21/2014  . Mediastinal adenopathy 01/21/2014  . Prostate cancer ([place])   . History of radiation therapy   . 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 Sick sinus syndrome ([place]) 04/16/2013  . Cardiac arrhythmia 07/12/2011  . COPD GOLD II 08/27/2010  . Abdominal aortic aneurysm ([place]) 05/14/2008  . ACTINIC KERATOSIS, HEAD 02/11/2008  . LOSS, CONDUCTIVE HEARING, BILATERAL 01/18/2007  . HLD (hyperlipidemia) 12/25/2006  . Essential hypertension 12/25/2006  . Coronary atherosclerosis 12/25/2006    Carey Bullocks, OTR/L 03/19/2015, 3:03 PM  [facility] [address] [place], [place], [address] Phone: [phone]   Fax:  [phone]

## 2015-03-19 NOTE — Therapy (Signed)
[facility] [address] [place], [place], [address] Phone: [phone]   Fax:  [phone]  Speech Language Pathology Treatment  Patient Details  Name: Nicholas Schmitt MRN: [id number] Date of Birth: 1936/01/31 Referring Provider:  Dorena Cookey, MD  Encounter Date: 03/19/2015      End of Session - 03/19/15 1530    Visit Number 8   Number of Visits 16   Date for SLP Re-Evaluation 04/13/15   SLP Start Time 3536   SLP Stop Time  1525   SLP Time Calculation (min) 38 min   Activity Tolerance Patient tolerated treatment well      Past Medical History  Diagnosis Date  . CAD (coronary artery disease)     positive stress test in 2006 led to left heart cath (8/06) showing 95% prox RCA, 90% CFX, and 90% mLAD.  patient had a cypher DES to all 3 lesions  . Hyperlipidemia   . Hearing loss   . AAA (abdominal aortic aneurysm) ([place]) 11/09    3.2 cm   . Chronic renal insufficiency   . History of radiation therapy 03/08/04- 04/08/04    prostate 4600 cGy in 3 fractions, radioactive seed implant 05/04/04  . Myocardial infarction ([place]) 1995  . Pacemaker 2014  . Shortness of breath     exertion  . COPD (chronic obstructive pulmonary disease) ([place])   . Radiation Jan.11-Jan 29/2016    Right central chest 35 gray in 14 fractions  . Radiation Jan.2016    35 gray in 14 fx lower lumbar/upper sacrum  . Diverticulosis   . Hemorrhoids   . Pneumonia     hx  . GERD (gastroesophageal reflux disease)   . Hypertension     no med now for 3-4 months  . Cancer [facility]) 2015    prostate   . Prostate cancer ([place]) 2005    gleason 7  . Skin cancer     ear  . Lung cancer ([place])   . Cancer [facility]) December 04, 2014    Cranial     Past Surgical History  Procedure Laterality Date  . Ptca  2006  . Coronary stent placement    . Permanent pacemaker insertion Bilateral 02/26/13    MDT Adapta L implanted by Dr Rayann Heman for sick sinus syndrome  .  Radioactive seed implant  05/04/2004    8000 cGy, Dr Danny Lawless Dr Reece Agar  . Elbow surgery Bilateral 1973  . Coronary angioplasty    . Mediastinoscopy N/A 01/28/2014    Procedure: MEDIASTINOSCOPY;  Surgeon: Melrose Nakayama, MD;  Location: [facility];  Service: Thoracic;  Laterality: N/A;  . Permanent pacemaker insertion N/A 02/26/2013    Procedure: PERMANENT PACEMAKER INSERTION;  Surgeon: [name], MD;  Location: [facility] CATH LAB;  Service: Cardiovascular;  Laterality: N/A;  . Insert / replace / remove pacemaker      not removed  . Craniotomy N/A 12/04/2014    Procedure: CRANIOTOMY TUMOR EXCISION, LEFT;  Surgeon: [name], MD;  Location: [facility] NEURO ORS;  Service: Neurosurgery;  Laterality: N/A;  CRANIOTOMY TUMOR EXCISION  . Application of cranial navigation  12/04/2014    Procedure: APPLICATION OF CRANIAL NAVIGATION;  Surgeon: [name], MD;  Location: [facility] NEURO ORS;  Service: Neurosurgery;;    There were no vitals filed for this visit.  Visit Diagnosis: Expressive aphasia  Receptive aphasia      Subjective Assessment - 03/19/15 1508    Subjective "Got a goin' with the - - -  arms." (pt, re: OT - arm bike)   Currently in Pain? No/denies               ADULT SLP TREATMENT - 03/19/15 1510    General Information   Behavior/Cognition Alert;Cooperative;Pleasant mood   Treatment Provided   Treatment provided Cognitive-Linquistic   Pain Assessment   Pain Assessment No/denies pain   Cognitive-Linquistic Treatment   Treatment focused on Aphasia   Skilled Treatment SLP targeted pt's written language today with name and address. name WNL however focused practice needed with street name and city due to aphasic errors. Pt req'd consistent mod A with city and street names. In simple naming tasks (responsive naming), pt's iniital response was 65% accurate, 95% success with self correction.    Assessment / Recommendations / Plan   Plan Continue with current plan of care    Progression Toward Goals   Progression toward goals Progressing toward goals            SLP Short Term Goals - 03/17/15 1057    SLP SHORT TERM GOAL #1   Title pt will complete simple naming tasks with 85% success, functionally   Status Achieved   SLP SHORT TERM GOAL #2   Title pt will demo understanding of mod complex linguistic stimuli (directions with before/after, yes/no questions, etc) 75% with occasional min A   Time 1   Period Weeks   Status On-going   SLP SHORT TERM GOAL #3   Title pt will demo understanding of 3-4 sentence paragraphs (longer verbal stimuli) by answering yes/no or "wh" questions with 80% success and occasional min cues   Time 1   Period Weeks   Status On-going   SLP SHORT TERM GOAL #4   Title pt will write name and address without signs of aphasia   Time 1   Period Weeks   Status On-going          SLP Long Term Goals - 03/17/15 1057    SLP LONG TERM GOAL #1   Title pt will demo understanding of functional 10 minutes simple to mod complex conversation with request for repeats allowed   Time 5   Period Weeks   Status On-going   SLP LONG TERM GOAL #2   Title pt will participate in 10 minutes simple conversation with compensations with rare min questioning cues   Time 5   Period Weeks   Status On-going   SLP LONG TERM GOAL #3   Title pt will write simple functional lists with rare min A for aphasic errors   Time 5   Period Weeks   Status On-going          Plan - 03/19/15 1531    Clinical Impression Statement Pt cont with receptive and expressive aphasia due to mets to brain and subsequent craniotomy. ST to cont to improve receptive and expressive language in order to miinimize caregiver burden of conversation and improve communcation between pt and friends/family.   Speech Therapy Frequency 2x / week   Duration --  5 weeks   Treatment/Interventions Compensatory strategies;SLP instruction and feedback;Patient/family education;Functional  tasks;Cueing hierarchy;Language facilitation   Potential to Achieve Goals Fair   Potential Considerations Co-morbidities        Problem List Patient Active Problem List   Diagnosis Date Noted  . Hyperbilirubinemia 01/17/2015  . DVT (deep venous thrombosis) ([place]) 01/17/2015  . Sensation of cold in leg 01/06/2015  . Pain of left leg 01/06/2015  . Metastasis to brain [facility])  12/04/2014  . Brain metastasis ([place]) 11/17/2014  . Encounter for antineoplastic immunotherapy 11/17/2014  . Other pancytopenia ([place])   . Acute respiratory failure with hypoxia ([place])   . Antineoplastic chemotherapy induced pancytopenia ([place])   . SOB (shortness of breath) 07/21/2014  . CAP (community acquired pneumonia) 07/21/2014  . Neoplasm related pain 06/18/2014  . Anemia in neoplastic disease 06/04/2014  . Dyspnea 06/02/2014  . Acute right hip pain 05/26/2014  . Non-small cell carcinoma of lung, stage 4 ([place]) 01/30/2014  . Nodule of right lung 01/21/2014  . Mediastinal adenopathy 01/21/2014  . Prostate cancer ([place])   . History of radiation therapy   . 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 Sick sinus syndrome ([place]) 04/16/2013  . Cardiac arrhythmia 07/12/2011  . COPD GOLD II 08/27/2010  . Abdominal aortic aneurysm ([place]) 05/14/2008  . ACTINIC KERATOSIS, HEAD 02/11/2008  . LOSS, CONDUCTIVE HEARING, BILATERAL 01/18/2007  . HLD (hyperlipidemia) 12/25/2006  . Essential hypertension 12/25/2006  . Coronary atherosclerosis 12/25/2006    [facility] , [place], CCC-SLP  03/19/2015, 3:32 PM  [facility] [address] [place], [place], [address] Phone: [phone]   Fax:  [phone]

## 2015-03-20 NOTE — Therapy (Signed)
[facility] [address] [place], [place], [address] Phone: [phone]   Fax:  [phone]  Physical Therapy Treatment  Patient Details  Name: Nicholas Schmitt MRN: [id number] Date of Birth: Dec 30, 1935 Referring Provider:  Dorena Cookey, MD  Encounter Date: 03/19/2015      PT End of Session - 03/20/15 1011    Visit Number 7   Number of Visits 17   Date for PT Re-Evaluation 04/14/15   Authorization Type BCBS Medicare-G-code required every 10th visit   PT Start Time 1317   PT Stop Time 1400   PT Time Calculation (min) 43 min   Equipment Utilized During Treatment Gait belt   Activity Tolerance Patient tolerated treatment well;Patient limited by fatigue  O2 sats taken during session 89-93%; HR 105-115 bpm   Behavior During Therapy [facility] for tasks assessed/performed      Past Medical History  Diagnosis Date  . CAD (coronary artery disease)     positive stress test in 2006 led to left heart cath (8/06) showing 95% prox RCA, 90% CFX, and 90% mLAD.  patient had a cypher DES to all 3 lesions  . Hyperlipidemia   . Hearing loss   . AAA (abdominal aortic aneurysm) ([place]) 11/09    3.2 cm   . Chronic renal insufficiency   . History of radiation therapy 03/08/04- 04/08/04    prostate 4600 cGy in 3 fractions, radioactive seed implant 05/04/04  . Myocardial infarction ([place]) 1995  . Pacemaker 2014  . Shortness of breath     exertion  . COPD (chronic obstructive pulmonary disease) ([place])   . Radiation Jan.11-Jan 29/2016    Right central chest 35 gray in 14 fractions  . Radiation Jan.2016    35 gray in 14 fx lower lumbar/upper sacrum  . Diverticulosis   . Hemorrhoids   . Pneumonia     hx  . GERD (gastroesophageal reflux disease)   . Hypertension     no med now for 3-4 months  . Cancer [facility]) 2015    prostate   . Prostate cancer ([place]) 2005    gleason 7  . Skin cancer     ear  . Lung cancer ([place])   . Cancer [facility])  December 04, 2014    Cranial     Past Surgical History  Procedure Laterality Date  . Ptca  2006  . Coronary stent placement    . Permanent pacemaker insertion Bilateral 02/26/13    MDT Adapta L implanted by Dr Rayann Heman for sick sinus syndrome  . Radioactive seed implant  05/04/2004    8000 cGy, Dr Danny Lawless Dr Reece Agar  . Elbow surgery Bilateral 1973  . Coronary angioplasty    . Mediastinoscopy N/A 01/28/2014    Procedure: MEDIASTINOSCOPY;  Surgeon: Melrose Nakayama, MD;  Location: [place];  Service: Thoracic;  Laterality: N/A;  . Permanent pacemaker insertion N/A 02/26/2013    Procedure: PERMANENT PACEMAKER INSERTION;  Surgeon: [name], MD;  Location: [facility] CATH LAB;  Service: Cardiovascular;  Laterality: N/A;  . Insert / replace / remove pacemaker      not removed  . Craniotomy N/A 12/04/2014    Procedure: CRANIOTOMY TUMOR EXCISION, LEFT;  Surgeon: [name], MD;  Location: [facility] NEURO ORS;  Service: Neurosurgery;  Laterality: N/A;  CRANIOTOMY TUMOR EXCISION  . Application of cranial navigation  12/04/2014    Procedure: APPLICATION OF CRANIAL NAVIGATION;  Surgeon: [name], MD;  Location: [facility] NEURO ORS;  Service: Neurosurgery;;  There were no vitals filed for this visit.  Visit Diagnosis:  Muscle weakness of lower extremity  Abnormality of gait  Decreased functional activity tolerance      Subjective Assessment - 03/19/15 1321    Subjective "Im getting by".  Hurting a little bit in my back   Currently in Pain? Yes   Pain Score 6    Pain Location Back   Pain Orientation Mid;Lower   Pain Descriptors / Indicators Aching   Pain Type Chronic pain   Pain Onset More than a month ago   Aggravating Factors  Unsure   Pain Relieving Factors Medicine                         [facility] Adult PT Treatment/Exercise - 03/19/15 1324    Ambulation/Gait   Ambulation/Gait Yes   Ambulation/Gait Assistance 5: Supervision   Ambulation Distance (Feet) 400 Feet  then 300  ft outdoors   Assistive device Rolling walker   Gait Pattern Step-through pattern;Wide base of support   Ambulation Surface Level;Indoor;Unlevel;Outdoor   Gait Comments Pt has difficulty negotiating RW on grass today, but he reports that his grass is lower and more level at home.  He reprots going out for at least 2 walks with his friend yesterday.   High Level Balance   High Level Balance Activities Backward walking;Side stepping;Marching forwards;Tandem walking  Forward/back walk 10 ft at counter 3 reps   High Level Balance Comments marching forward and tandem gait 10 ft x 2 reps each direction  Pt requires several seated rest breaks during activities                PT Education - 03/20/15 1010    Education provided Yes   Education Details Discussed with pt, girlfriend, June-progress towards goals, POC to continue therapy.  Discussed importance of HEP performance at home and walking program.   Person(s) Educated Patient;Other (comment)  girlfriend, June   Methods Explanation   Comprehension Verbalized understanding          PT Short Term Goals - 03/17/15 1154    PT SHORT TERM GOAL #1   Title Pt will perform HEP with family supervision for improved strength, balance, mobility.  Target 03/14/15   Status Achieved   PT SHORT TERM GOAL #2   Title Pt will improve Berg Balance score to at least 37/56 for decreased fall risk.   Baseline 32/56 at goal check 03/17/15   Status Not Met   PT SHORT TERM GOAL #3   Title Pt will improve TUG score to less than or equal to 13.5 seconds for decreased fall risk.   Baseline 12.99 sec 03/17/15   Status Achieved   PT SHORT TERM GOAL #4   Title Pt will ambulate at least 200 ft. using least restrictive assistive device (cane vs. RW) modified independently, for improved functional mobility.   Baseline 240 ft requires min guard assistance/supervision   Status Partially Met           PT Long Term Goals - 02/12/15 1419    PT LONG TERM GOAL  #1   Title Pt/family will verbalize understanding of fall prevention within home environment.  TARGET 04/14/15   Time 8   Period Weeks   Status New   PT LONG TERM GOAL #2   Title Pt will improve Berg Balance score to at least 42/56 for decreased fall risk.   Time 8   Period Weeks   Status  New   PT LONG TERM GOAL #3   Title Pt will improve gait velocity to at least 2.62 ft/sec for improved gait efficiency and safety.   Time 8   Period Weeks   Status New   PT LONG TERM GOAL #4   Title Pt will ambulate at least 500 ft, indoor and outdoor surfaces, modified independently, for improved functional mobility.   Time 8   Period Weeks   Status New   PT LONG TERM GOAL #5   Title Pt will verbalize plans for continued community fitness upon D/C from PT.   Time 8   Status New               Plan - 03/20/15 1012    Clinical Impression Statement Pt is ambulating better with outdoor and with indoor gait, with use of rolling walker.  Pt's girlfriend reports participation in walking program at home.  Pt will continue to benefit from further skilled PT to address balance, strength, and gait.  Pt indicates he is so fatigued from multiple therapies in one day that he may need to reduce therapies that he sees in one day from 3 to 2.  Unsure how this may affect pt's frequency of PT visits in the coming weeks.   Pt will benefit from skilled therapeutic intervention in order to improve on the following deficits Abnormal gait;Decreased activity tolerance;Decreased balance;Decreased mobility;Decreased safety awareness;Decreased strength;Difficulty walking   Rehab Potential Good   PT Frequency 2x / week   PT Duration 8 weeks   PT Treatment/Interventions ADLs/Self Care Home Management;Therapeutic exercise;Therapeutic activities;Functional mobility training;Gait training;Patient/family education;DME Instruction;Neuromuscular re-education;Balance training   PT Next Visit Plan Gait training with RW;  strength/balance training; fall prevention education   Consulted and Agree with Plan of Care Patient;Other (Comment)   Family Member Consulted girlfriend-June        Problem List Patient Active Problem List   Diagnosis Date Noted  . Hyperbilirubinemia 01/17/2015  . DVT (deep venous thrombosis) ([place]) 01/17/2015  . Sensation of cold in leg 01/06/2015  . Pain of left leg 01/06/2015  . Metastasis to brain ([place]) 12/04/2014  . Brain metastasis ([place]) 11/17/2014  . Encounter for antineoplastic immunotherapy 11/17/2014  . Other pancytopenia ([place])   . Acute respiratory failure with hypoxia ([place])   . Antineoplastic chemotherapy induced pancytopenia ([place])   . SOB (shortness of breath) 07/21/2014  . CAP (community acquired pneumonia) 07/21/2014  . Neoplasm related pain 06/18/2014  . Anemia in neoplastic disease 06/04/2014  . Dyspnea 06/02/2014  . Acute right hip pain 05/26/2014  . Non-small cell carcinoma of lung, stage 4 ([place]) 01/30/2014  . Nodule of right lung 01/21/2014  . Mediastinal adenopathy 01/21/2014  . Prostate cancer ([place])   . History of radiation therapy   . 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 Sick sinus syndrome ([place]) 04/16/2013  . Cardiac arrhythmia 07/12/2011  . COPD GOLD II 08/27/2010  . Abdominal aortic aneurysm ([place]) 05/14/2008  . ACTINIC KERATOSIS, HEAD 02/11/2008  . LOSS, CONDUCTIVE HEARING, BILATERAL 01/18/2007  . HLD (hyperlipidemia) 12/25/2006  . Essential hypertension 12/25/2006  . Coronary atherosclerosis 12/25/2006    Deja Kaigler W. 03/20/2015, 10:15 AM  Frazier Butt., PT  [facility] [address] [place], [place], [address] Phone: [phone]   Fax:  [phone]

## 2015-03-24 ENCOUNTER — Ambulatory Visit: Payer: Medicare Other

## 2015-03-24 ENCOUNTER — Ambulatory Visit: Payer: Medicare Other | Admitting: Physical Therapy

## 2015-03-24 DIAGNOSIS — R6889 Other general symptoms and signs: Secondary | ICD-10-CM

## 2015-03-24 DIAGNOSIS — M6281 Muscle weakness (generalized): Secondary | ICD-10-CM | POA: Diagnosis not present

## 2015-03-24 DIAGNOSIS — R4701 Aphasia: Secondary | ICD-10-CM

## 2015-03-24 DIAGNOSIS — R269 Unspecified abnormalities of gait and mobility: Secondary | ICD-10-CM

## 2015-03-24 NOTE — Patient Instructions (Signed)
  Please complete the assigned speech therapy homework and return it to your next session.  

## 2015-03-24 NOTE — Therapy (Signed)
[place] [address] [place], [place], [address] Phone: [phone]   Fax:  [phone]  Physical Therapy Treatment  Patient Details  Name: Nicholas Schmitt MRN: [id number] Date of Birth: December 25, 1935 Referring Provider:  Dorena Cookey, MD  Encounter Date: 03/24/2015      PT End of Session - 03/25/15 1058    Visit Number 8   Number of Visits 17   Date for PT Re-Evaluation 04/14/15   Authorization Type BCBS Medicare-G-code required every 10th visit   PT Start Time 1404   PT Stop Time 1445   PT Time Calculation (min) 41 min   Equipment Utilized During Treatment Gait belt   Activity Tolerance Patient tolerated treatment well   Behavior During Therapy [facility] for tasks assessed/performed      Past Medical History  Diagnosis Date  . CAD (coronary artery disease)     positive stress test in 2006 led to left heart cath (8/06) showing 95% prox RCA, 90% CFX, and 90% mLAD.  patient had a cypher DES to all 3 lesions  . Hyperlipidemia   . Hearing loss   . AAA (abdominal aortic aneurysm) ([place]) 11/09    3.2 cm   . Chronic renal insufficiency   . History of radiation therapy 03/08/04- 04/08/04    prostate 4600 cGy in 3 fractions, radioactive seed implant 05/04/04  . Myocardial infarction ([place]) 1995  . Pacemaker 2014  . Shortness of breath     exertion  . COPD (chronic obstructive pulmonary disease) ([place])   . Radiation Jan.11-Jan 29/2016    Right central chest 35 gray in 14 fractions  . Radiation Jan.2016    35 gray in 14 fx lower lumbar/upper sacrum  . Diverticulosis   . Hemorrhoids   . Pneumonia     hx  . GERD (gastroesophageal reflux disease)   . Hypertension     no med now for 3-4 months  . Cancer [facility]) 2015    prostate   . Prostate cancer ([place]) 2005    gleason 7  . Skin cancer     ear  . Lung cancer ([place])   . Cancer [facility]) December 04, 2014    Cranial     Past Surgical History  Procedure Laterality Date   . Ptca  2006  . Coronary stent placement    . Permanent pacemaker insertion Bilateral 02/26/13    MDT Adapta L implanted by Dr Rayann Heman for sick sinus syndrome  . Radioactive seed implant  05/04/2004    8000 cGy, Dr Danny Lawless Dr Reece Agar  . Elbow surgery Bilateral 1973  . Coronary angioplasty    . Mediastinoscopy N/A 01/28/2014    Procedure: MEDIASTINOSCOPY;  Surgeon: Melrose Nakayama, MD;  Location: [place];  Service: Thoracic;  Laterality: N/A;  . Permanent pacemaker insertion N/A 02/26/2013    Procedure: PERMANENT PACEMAKER INSERTION;  Surgeon: [name], MD;  Location: [place] CATH LAB;  Service: Cardiovascular;  Laterality: N/A;  . Insert / replace / remove pacemaker      not removed  . Craniotomy N/A 12/04/2014    Procedure: CRANIOTOMY TUMOR EXCISION, LEFT;  Surgeon: [name], MD;  Location: [place] NEURO ORS;  Service: Neurosurgery;  Laterality: N/A;  CRANIOTOMY TUMOR EXCISION  . Application of cranial navigation  12/04/2014    Procedure: APPLICATION OF CRANIAL NAVIGATION;  Surgeon: [name], MD;  Location: [place] NEURO ORS;  Service: Neurosurgery;;    There were no vitals filed for this visit.  Visit  Diagnosis:  Decreased functional activity tolerance  Abnormality of gait      Subjective Assessment - 03/24/15 1410    Subjective Pt reports his back is hurting.  "You dont have any Aleve do you?"   Patient Stated Goals Pt wants to get help with talking and with R arm.  Pt in agreement with balance assessment and walking.   Currently in Pain? Yes   Pain Score 6    Pain Location Back   Pain Orientation Lower;Medial;Right   Pain Descriptors / Indicators Aching   Pain Type Chronic pain   Pain Onset More than a month ago   Pain Frequency Intermittent   Pain Relieving Factors medicine   Multiple Pain Sites Yes   Pain Score 5   Pain Location Leg   Pain Orientation Right   Pain Descriptors / Indicators Sharp   Pain Onset More than a month ago   Pain Frequency Intermittent    Pain Relieving Factors medicine   Effect of Pain on Daily Activities wakes pt up at night       Ambulated 130' with SPC with EHR 115 SaO2 93% Ambulated 260' with RW and EHR 124 SaO2 90%-encouraged deep breathing and recovered to 97% within several minutes  Supine-Single knee to chest x 30 seconds x 3 each side, double knee to chest x 30 seconds x 3, LE trunk rotation side to side x 5 with 10 second hold  Scifit level 1.5 alternating between all 4 extremities and LE's only at 60 rpm x 5 minutes.  SaO2 93% EHR 117.                                                                        PT Short Term Goals - 03/17/15 1154    PT SHORT TERM GOAL #1   Title Pt will perform HEP with family supervision for improved strength, balance, mobility.  Target 03/14/15   Status Achieved   PT SHORT TERM GOAL #2   Title Pt will improve Berg Balance score to at least 37/56 for decreased fall risk.   Baseline 32/56 at goal check 03/17/15   Status Not Met   PT SHORT TERM GOAL #3   Title Pt will improve TUG score to less than or equal to 13.5 seconds for decreased fall risk.   Baseline 12.99 sec 03/17/15   Status Achieved   PT SHORT TERM GOAL #4   Title Pt will ambulate at least 200 ft. using least restrictive assistive device (cane vs. RW) modified independently, for improved functional mobility.   Baseline 240 ft requires min guard assistance/supervision   Status Partially Met           PT Long Term Goals - 02/12/15 1419    PT LONG TERM GOAL #1   Title Pt/family will verbalize understanding of fall prevention within home environment.  TARGET 04/14/15   Time 8   Period Weeks   Status New   PT LONG TERM GOAL #2   Title Pt will improve Berg Balance score to at least 42/56 for decreased fall risk.   Time 8   Period Weeks   Status New   PT LONG TERM GOAL #3   Title Pt will improve gait velocity  to at least 2.62 ft/sec for improved gait efficiency and safety.   Time 8    Period Weeks   Status New   PT LONG TERM GOAL #4   Title Pt will ambulate at least 500 ft, indoor and outdoor surfaces, modified independently, for improved functional mobility.   Time 8   Period Weeks   Status New   PT LONG TERM GOAL #5   Title Pt will verbalize plans for continued community fitness upon D/C from PT.   Time 8   Status New               Plan - 03/25/15 1059    Clinical Impression Statement Pt continues to need frequent rest breaks during session.  Reported decreased back pain to 2/10 after treatment today.  Continue PT per POC.   Pt will benefit from skilled therapeutic intervention in order to improve on the following deficits Abnormal gait;Decreased activity tolerance;Decreased balance;Decreased mobility;Decreased safety awareness;Decreased strength;Difficulty walking   Rehab Potential Good   PT Frequency 2x / week   PT Duration 8 weeks   PT Treatment/Interventions ADLs/Self Care Home Management;Therapeutic exercise;Therapeutic activities;Functional mobility training;Gait training;Patient/family education;DME Instruction;Neuromuscular re-education;Balance training   PT Next Visit Plan strengthening, endurance, fall prevention education   Consulted and Agree with Plan of Care Patient        Problem List Patient Active Problem List   Diagnosis Date Noted  . Hyperbilirubinemia 01/17/2015  . DVT (deep venous thrombosis) ([place]) 01/17/2015  . Sensation of cold in leg 01/06/2015  . Pain of left leg 01/06/2015  . Metastasis to brain ([place]) 12/04/2014  . Brain metastasis ([place]) 11/17/2014  . Encounter for antineoplastic immunotherapy 11/17/2014  . Other pancytopenia ([place])   . Acute respiratory failure with hypoxia ([place])   . Antineoplastic chemotherapy induced pancytopenia ([place])   . SOB (shortness of breath) 07/21/2014  . CAP (community acquired pneumonia) 07/21/2014  . Neoplasm related pain 06/18/2014  . Anemia in neoplastic disease 06/04/2014  . Dyspnea  06/02/2014  . Acute right hip pain 05/26/2014  . Non-small cell carcinoma of lung, stage 4 ([place]) 01/30/2014  . Nodule of right lung 01/21/2014  . Mediastinal adenopathy 01/21/2014  . Prostate cancer ([place])   . History of radiation therapy   . 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 Sick sinus syndrome ([place]) 04/16/2013  . Cardiac arrhythmia 07/12/2011  . COPD GOLD II 08/27/2010  . Abdominal aortic aneurysm ([place]) 05/14/2008  . ACTINIC KERATOSIS, HEAD 02/11/2008  . LOSS, CONDUCTIVE HEARING, BILATERAL 01/18/2007  . HLD (hyperlipidemia) 12/25/2006  . Essential hypertension 12/25/2006  . Coronary atherosclerosis 12/25/2006    Narda Bonds 03/25/2015, 11:01 AM  [facility] [address] [place], [place], [address] Phone: [phone]   Fax:  [place], [place] 03/25/2015 11:01 AM Phone: [phone] Fax: [phone]

## 2015-03-24 NOTE — Therapy (Signed)
[place] [address] [place], [place], [address] Phone: [phone]   Fax:  [phone]  Speech Language Pathology Treatment  Patient Details  Name: Nicholas Schmitt MRN: [id number] Date of Birth: 1935/09/18 Referring Provider:  Dorena Cookey, MD  Encounter Date: 03/24/2015      End of Session - 03/24/15 1644    Visit Number 9   Number of Visits 16   Date for SLP Re-Evaluation 04/13/15   SLP Start Time 1533   SLP Stop Time  1615   SLP Time Calculation (min) 42 min   Activity Tolerance Patient tolerated treatment well      Past Medical History  Diagnosis Date  . CAD (coronary artery disease)     positive stress test in 2006 led to left heart cath (8/06) showing 95% prox RCA, 90% CFX, and 90% mLAD.  patient had a cypher DES to all 3 lesions  . Hyperlipidemia   . Hearing loss   . AAA (abdominal aortic aneurysm) ([place]) 11/09    3.2 cm   . Chronic renal insufficiency   . History of radiation therapy 03/08/04- 04/08/04    prostate 4600 cGy in 3 fractions, radioactive seed implant 05/04/04  . Myocardial infarction ([place]) 1995  . Pacemaker 2014  . Shortness of breath     exertion  . COPD (chronic obstructive pulmonary disease) ([place])   . Radiation Jan.11-Jan 29/2016    Right central chest 35 gray in 14 fractions  . Radiation Jan.2016    35 gray in 14 fx lower lumbar/upper sacrum  . Diverticulosis   . Hemorrhoids   . Pneumonia     hx  . GERD (gastroesophageal reflux disease)   . Hypertension     no med now for 3-4 months  . Cancer [facility]) 2015    prostate   . Prostate cancer ([place]) 2005    gleason 7  . Skin cancer     ear  . Lung cancer ([place])   . Cancer [facility]) December 04, 2014    Cranial     Past Surgical History  Procedure Laterality Date  . Ptca  2006  . Coronary stent placement    . Permanent pacemaker insertion Bilateral 02/26/13    MDT Adapta L implanted by Dr Rayann Heman for sick sinus syndrome  .  Radioactive seed implant  05/04/2004    8000 cGy, Dr Danny Lawless Dr Reece Agar  . Elbow surgery Bilateral 1973  . Coronary angioplasty    . Mediastinoscopy N/A 01/28/2014    Procedure: MEDIASTINOSCOPY;  Surgeon: Melrose Nakayama, MD;  Location: [place];  Service: Thoracic;  Laterality: N/A;  . Permanent pacemaker insertion N/A 02/26/2013    Procedure: PERMANENT PACEMAKER INSERTION;  Surgeon: [name], MD;  Location: [place] CATH LAB;  Service: Cardiovascular;  Laterality: N/A;  . Insert / replace / remove pacemaker      not removed  . Craniotomy N/A 12/04/2014    Procedure: CRANIOTOMY TUMOR EXCISION, LEFT;  Surgeon: [name], MD;  Location: [place] NEURO ORS;  Service: Neurosurgery;  Laterality: N/A;  CRANIOTOMY TUMOR EXCISION  . Application of cranial navigation  12/04/2014    Procedure: APPLICATION OF CRANIAL NAVIGATION;  Surgeon: [name], MD;  Location: [place] NEURO ORS;  Service: Neurosurgery;;    There were no vitals filed for this visit.  Visit Diagnosis: Expressive aphasia  Receptive aphasia      Subjective Assessment - 03/24/15 1546    Subjective "I had to - - -  wait-wait -- out there. If it happens again I'm gonna - - tell them I'm going home."               ADULT SLP TREATMENT - 03/24/15 1559    Pain Assessment   Pain Assessment 0-10   Pain Score 5    Pain Location rt hip   Pain Descriptors / Indicators Sore   Pain Intervention(s) Monitored during session   Cognitive-Linquistic Treatment   Treatment focused on Aphasia   Skilled Treatment Simple naming tasks (divergent - 4 items) completed with consistent extra time for self correction - 90% success. when expanding to 8 items pt req'd min-mod A occasionally and extra time consistently. Pt wrote opposites to words with reduced error awareness and mod A occasionally.   Assessment / Recommendations / Plan   Plan Continue with current plan of care   Progression Toward Goals   Progression toward goals Progressing  toward goals            SLP Short Term Goals - 03/24/15 1644    SLP SHORT TERM GOAL #1   Title pt will complete simple naming tasks with 85% success, functionally   Status Achieved   SLP SHORT TERM GOAL #2   Title pt will demo understanding of mod complex linguistic stimuli (directions with before/after, yes/no questions, etc) 75% with occasional min A   Time 1   Period Weeks   Status Deferred  due to focus on expressive language   SLP SHORT TERM GOAL #3   Title pt will demo understanding of 3-4 sentence paragraphs (longer verbal stimuli) by answering yes/no or "wh" questions with 80% success and occasional min cues   Time 1   Period Weeks   Status Not Met   SLP SHORT TERM GOAL #4   Title pt will write name and address without signs of aphasia   Time 1   Period Weeks   Status On-going          SLP Long Term Goals - 03/24/15 1650    SLP LONG TERM GOAL #1   Title pt will demo understanding of functional 10 minutes simple to mod complex conversation with request for repeats allowed   Time 4   Period Weeks   Status On-going   SLP LONG TERM GOAL #2   Title pt will participate in 10 minutes simple conversation functionally, with compensations, with occasional min questioning cues   Time 4   Period Weeks   Status Revised   SLP LONG TERM GOAL #3   Title pt will write simple functional lists with rare min A for aphasic errors   Time 4   Period Weeks   Status On-going          Plan - 03/24/15 1644    Clinical Impression Statement Pt cont with receptive and expressive aphasia due to mets to brain and subsequent craniotomy. ST to cont to improve receptive and expressive language in order to miinimize caregiver burden of conversation and improve communcation between pt and friends/family.   Speech Therapy Frequency 2x / week   Duration 4 weeks   Treatment/Interventions Compensatory strategies;SLP instruction and feedback;Patient/family education;Functional tasks;Cueing  hierarchy;Language facilitation   Potential to Achieve Goals Fair   Potential Considerations Co-morbidities        Problem List Patient Active Problem List   Diagnosis Date Noted  . Hyperbilirubinemia 01/17/2015  . DVT (deep venous thrombosis) ([place]) 01/17/2015  . Sensation of cold in leg 01/06/2015  . Pain  of left leg 01/06/2015  . Metastasis to brain ([place]) 12/04/2014  . Brain metastasis ([place]) 11/17/2014  . Encounter for antineoplastic immunotherapy 11/17/2014  . Other pancytopenia ([place])   . Acute respiratory failure with hypoxia ([place])   . Antineoplastic chemotherapy induced pancytopenia ([place])   . SOB (shortness of breath) 07/21/2014  . CAP (community acquired pneumonia) 07/21/2014  . Neoplasm related pain 06/18/2014  . Anemia in neoplastic disease 06/04/2014  . Dyspnea 06/02/2014  . Acute right hip pain 05/26/2014  . Non-small cell carcinoma of lung, stage 4 ([place]) 01/30/2014  . Nodule of right lung 01/21/2014  . Mediastinal adenopathy 01/21/2014  . Prostate cancer ([place])   . History of radiation therapy   . 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 Sick sinus syndrome ([place]) 04/16/2013  . Cardiac arrhythmia 07/12/2011  . COPD GOLD II 08/27/2010  . Abdominal aortic aneurysm ([place]) 05/14/2008  . ACTINIC KERATOSIS, HEAD 02/11/2008  . LOSS, CONDUCTIVE HEARING, BILATERAL 01/18/2007  . HLD (hyperlipidemia) 12/25/2006  . Essential hypertension 12/25/2006  . Coronary atherosclerosis 12/25/2006    [facility] , [place], [place]  03/24/2015, 4:51 PM  [place] [address] [place], [place], [address] Phone: [phone]   Fax:  [phone]

## 2015-03-25 ENCOUNTER — Ambulatory Visit: Payer: Medicare Other | Admitting: Physical Therapy

## 2015-03-25 DIAGNOSIS — M6281 Muscle weakness (generalized): Secondary | ICD-10-CM

## 2015-03-25 DIAGNOSIS — R6889 Other general symptoms and signs: Secondary | ICD-10-CM

## 2015-03-25 NOTE — Therapy (Signed)
[facility] [address] [place], [place], [address] Phone: [phone]   Fax:  [phone]  Physical Therapy Treatment  Patient Details  Name: Nicholas Schmitt MRN: [id number] Date of Birth: [birth year] Referring Provider:  Dorena Cookey, MD  Encounter Date: 03/25/2015      PT End of Session - 03/25/15 1120    Visit Number 9   Number of Visits 17   Date for PT Re-Evaluation 04/14/15   Authorization Type BCBS Medicare-G-code required every 10th visit   PT Start Time 1019   PT Stop Time 1102   PT Time Calculation (min) 43 min   Activity Tolerance Patient tolerated treatment well   Behavior During Therapy [facility] for tasks assessed/performed      Past Medical History  Diagnosis Date  . CAD (coronary artery disease)     positive stress test in 2006 led to left heart cath (8/06) showing 95% prox RCA, 90% CFX, and 90% mLAD.  patient had a cypher DES to all 3 lesions  . Hyperlipidemia   . Hearing loss   . AAA (abdominal aortic aneurysm) ([place]) 11/09    3.2 cm   . Chronic renal insufficiency   . History of radiation therapy 03/08/04- 04/08/04    prostate 4600 cGy in 3 fractions, radioactive seed implant 05/04/04  . Myocardial infarction ([place]) 1995  . Pacemaker 2014  . Shortness of breath     exertion  . COPD (chronic obstructive pulmonary disease) ([place])   . Radiation Jan.11-Jan 29/2016    Right central chest 35 gray in 14 fractions  . Radiation Jan.2016    35 gray in 14 fx lower lumbar/upper sacrum  . Diverticulosis   . Hemorrhoids   . Pneumonia     hx  . GERD (gastroesophageal reflux disease)   . Hypertension     no med now for 3-4 months  . Cancer [facility]) 2015    prostate   . Prostate cancer ([place]) 2005    gleason 7  . Skin cancer     ear  . Lung cancer ([place])   . Cancer [facility]) December 04, 2014    Cranial     Past Surgical History  Procedure Laterality Date  . Ptca  2006  . Coronary stent placement    .  Permanent pacemaker insertion Bilateral 02/26/13    MDT Adapta L implanted by Dr Rayann Heman for sick sinus syndrome  . Radioactive seed implant  05/04/2004    8000 cGy, Dr Danny Lawless Dr Reece Agar  . Elbow surgery Bilateral 1973  . Coronary angioplasty    . Mediastinoscopy N/A 01/28/2014    Procedure: MEDIASTINOSCOPY;  Surgeon: Melrose Nakayama, MD;  Location: [facility];  Service: Thoracic;  Laterality: N/A;  . Permanent pacemaker insertion N/A 02/26/2013    Procedure: PERMANENT PACEMAKER INSERTION;  Surgeon: [name], MD;  Location: [facility] CATH [facility];  Service: Cardiovascular;  Laterality: N/A;  . Insert / replace / remove pacemaker      not removed  . Craniotomy N/A 12/04/2014    Procedure: CRANIOTOMY TUMOR EXCISION, LEFT;  Surgeon: [name], MD;  Location: [facility]UR[facility];  Service: Neurosurgery;  Laterality: N/A;  CRANIOTOMY TUMOR EXCISION  . Application of cranial navigation  12/04/2014    Procedure: APPLICATION OF CRANIAL NAVIGATION;  Surgeon: [name], MD;  Location: [facility];  Service: Neurosurgery;;    There were no vitals filed for this visit.  Visit Diagnosis:  Decreased functional activity tolerance  Muscle  weakness of lower extremity      Subjective Assessment - 03/25/15 1107    Subjective Vomitted x 3 last night but none since.  Denies falls.   Patient Stated Goals Pt wants to get help with talking and with R arm.  Pt in agreement with balance assessment and walking.   Currently in Pain? Yes   Pain Score 2    Pain Location Back   Pain Orientation Lower;Medial;Right   Pain Descriptors / Indicators Aching   Pain Type Chronic pain   Pain Onset More than a month ago   Pain Frequency Intermittent   Aggravating Factors  unsure   Pain Relieving Factors medicine   Multiple Pain Sites No                         [facility] Adult PT Treatment/Exercise - 03/25/15 1108    Transfers   Transfers Sit to Stand;Stand to Sit   Sit to Stand 5: Supervision;With upper  extremity assist;From chair/3-in-1;From bed   Stand to Sit 5: Supervision;With upper extremity assist;To bed;To chair/3-in-1   Comments --   Lumbar Exercises: Stretches   Double Knee to Chest Stretch 3 reps;30 seconds   Knee/Hip Exercises: Aerobic   Stationary Bike Scifit level 1.5 all 4 extremities x 6 minutes   Knee/Hip Exercises: Seated   Long Arc Quad Both;2 sets;10 reps   Long Arc Quad Weight 2 lbs.   Long Arc Quad Limitations decreased ROM on R   Knee/Hip Flexion Both 2 sets of 10 reps with 2# weight   Sit to [employer] 10 reps;with UE support   Knee/Hip Exercises: Supine   Short Arc Quad Sets Both;2 sets;10 reps;Limitations;Other (comment)  2# weight on L, none on R   Short Arc [employer] Limitations poor ROM on R   Bridges Both;10 reps   Bridges with [employer] Strengthening;Both;10 reps   Other Supine Knee/Hip Exercises hip abduction bil with yellow band x 10 then single sided x 10 yellow band                PT Education - 03/25/15 1119    Education provided Yes   Education Details Discussed bed mobility with log roll for back pain   Person(s) Educated Patient   Methods Explanation;Demonstration   Comprehension Verbalized understanding;Need further instruction          PT Short Term Goals - 03/17/15 1154    PT SHORT TERM GOAL #1   Title Pt will perform HEP with family supervision for improved strength, balance, mobility.  Target 03/14/15   Status Achieved   PT SHORT TERM GOAL #2   Title Pt will improve Berg Balance score to at least 37/56 for decreased fall risk.   Baseline 32/56 at goal check 03/17/15   Status Not Met   PT SHORT TERM GOAL #3   Title Pt will improve TUG score to less than or equal to 13.5 seconds for decreased fall risk.   Baseline 12.99 sec 03/17/15   Status Achieved   PT SHORT TERM GOAL #4   Title Pt will ambulate at least 200 ft. using least restrictive assistive device (cane vs. RW) modified independently, for improved functional mobility.    Baseline 240 ft requires min guard assistance/supervision   Status Partially Met           PT Long Term Goals - 02/12/15 1419    PT LONG TERM GOAL #1   Title Pt/family will verbalize understanding  of fall prevention within home environment.  TARGET 04/14/15   Time 8   Period Weeks   Status New   PT LONG TERM GOAL #2   Title Pt will improve Berg Balance score to at least 42/56 for decreased fall risk.   Time 8   Period Weeks   Status New   PT LONG TERM GOAL #3   Title Pt will improve gait velocity to at least 2.62 ft/sec for improved gait efficiency and safety.   Time 8   Period Weeks   Status New   PT LONG TERM GOAL #4   Title Pt will ambulate at least 500 ft, indoor and outdoor surfaces, modified independently, for improved functional mobility.   Time 8   Period Weeks   Status New   PT LONG TERM GOAL #5   Title Pt will verbalize plans for continued community fitness upon D/C from PT.   Time 8   Status New               Plan - 03/25/15 1120    Clinical Impression Statement Pt tolerated increased exercises well today with continued need for rest breaks.  Poor knee extension on R LE.  Continue PT per POC.   Pt will benefit from skilled therapeutic intervention in order to improve on the following deficits Abnormal gait;Decreased activity tolerance;Decreased balance;Decreased mobility;Decreased safety awareness;Decreased strength;Difficulty walking   Rehab Potential Good   PT Frequency 2x / week   PT Duration 8 weeks   PT Treatment/Interventions ADLs/Self Care Home Management;Therapeutic exercise;Therapeutic activities;Functional mobility training;Gait training;Patient/family education;DME Instruction;Neuromuscular re-education;Balance training   PT Next Visit Plan strengthening, endurance, fall prevention education   Consulted and Agree with Plan of Care Patient        Problem List Patient Active Problem List   Diagnosis Date Noted  . Hyperbilirubinemia  01/17/2015  . DVT (deep venous thrombosis) ([place]) 01/17/2015  . Sensation of cold in leg 01/06/2015  . Pain of left leg 01/06/2015  . Metastasis to brain ([place]) 12/04/2014  . Brain metastasis ([place]) 11/17/2014  . Encounter for antineoplastic immunotherapy 11/17/2014  . Other pancytopenia ([place])   . Acute respiratory failure with hypoxia ([place])   . Antineoplastic chemotherapy induced pancytopenia ([place])   . SOB (shortness of breath) 07/21/2014  . CAP (community acquired pneumonia) 07/21/2014  . Neoplasm related pain 06/18/2014  . Anemia in neoplastic disease 06/04/2014  . Dyspnea 06/02/2014  . Acute right hip pain 05/26/2014  . Non-small cell carcinoma of lung, stage 4 ([place]) 01/30/2014  . Nodule of right lung 01/21/2014  . Mediastinal adenopathy 01/21/2014  . Prostate cancer ([place])   . History of radiation therapy   . 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 Sick sinus syndrome ([place]) 04/16/2013  . Cardiac arrhythmia 07/12/2011  . COPD GOLD II 08/27/2010  . Abdominal aortic aneurysm ([place]) 05/14/2008  . ACTINIC KERATOSIS, HEAD 02/11/2008  . LOSS, CONDUCTIVE HEARING, BILATERAL 01/18/2007  . HLD (hyperlipidemia) 12/25/2006  . Essential hypertension 12/25/2006  . Coronary atherosclerosis 12/25/2006    Narda Bonds 03/25/2015, [facility] [address] [place], [place], [address] Phone: [phone]   Fax:  [place], [place] 03/25/2015 11:22 AM Phone: [phone] Fax: [phone]

## 2015-03-27 ENCOUNTER — Ambulatory Visit (HOSPITAL_COMMUNITY)
Admission: RE | Admit: 2015-03-27 | Discharge: 2015-03-27 | Disposition: A | Discharge status: Home / Self Care | Payer: Medicare Other | Source: Ambulatory Visit | Attending: Physician Assistant | Admitting: Physician Assistant

## 2015-03-27 ENCOUNTER — Ambulatory Visit (HOSPITAL_COMMUNITY): Payer: Medicare Other

## 2015-03-27 ENCOUNTER — Encounter (HOSPITAL_COMMUNITY): Payer: Self-pay

## 2015-03-27 DIAGNOSIS — Z08 Encounter for follow-up examination after completed treatment for malignant neoplasm: Secondary | ICD-10-CM | POA: Insufficient documentation

## 2015-03-27 DIAGNOSIS — C3490 Malignant neoplasm of unspecified part of unspecified bronchus or lung: Secondary | ICD-10-CM | POA: Diagnosis present

## 2015-03-27 DIAGNOSIS — J701 Chronic and other pulmonary manifestations due to radiation: Secondary | ICD-10-CM | POA: Insufficient documentation

## 2015-03-27 DIAGNOSIS — C7951 Secondary malignant neoplasm of bone: Secondary | ICD-10-CM | POA: Insufficient documentation

## 2015-03-27 DIAGNOSIS — I714 Abdominal aortic aneurysm, without rupture: Secondary | ICD-10-CM | POA: Diagnosis not present

## 2015-03-27 MED ORDER — IOHEXOL 300 MG/ML  SOLN
100.0000 mL | Freq: Once | INTRAMUSCULAR | Status: AC | PRN
  Administered 2015-03-27: 100 mL via INTRAVENOUS

## 2015-03-30 ENCOUNTER — Ambulatory Visit: Payer: Medicare Other | Admitting: Physical Therapy

## 2015-03-31 ENCOUNTER — Other Ambulatory Visit: Payer: Self-pay | Admitting: Medical Oncology

## 2015-03-31 ENCOUNTER — Encounter: Payer: Self-pay | Admitting: Internal Medicine

## 2015-03-31 ENCOUNTER — Ambulatory Visit: Payer: Medicare Other | Admitting: Nutrition

## 2015-03-31 ENCOUNTER — Other Ambulatory Visit (HOSPITAL_BASED_OUTPATIENT_CLINIC_OR_DEPARTMENT_OTHER): Payer: Medicare Other

## 2015-03-31 ENCOUNTER — Ambulatory Visit (HOSPITAL_BASED_OUTPATIENT_CLINIC_OR_DEPARTMENT_OTHER): Payer: Medicare Other | Admitting: Internal Medicine

## 2015-03-31 ENCOUNTER — Ambulatory Visit (HOSPITAL_BASED_OUTPATIENT_CLINIC_OR_DEPARTMENT_OTHER): Payer: Medicare Other

## 2015-03-31 VITALS — BP 115/66 | HR 110 | Temp 98.5°F | Resp 18 | Ht 72.0 in | Wt 170.4 lb

## 2015-03-31 DIAGNOSIS — C7931 Secondary malignant neoplasm of brain: Secondary | ICD-10-CM

## 2015-03-31 DIAGNOSIS — C7951 Secondary malignant neoplasm of bone: Secondary | ICD-10-CM

## 2015-03-31 DIAGNOSIS — C3490 Malignant neoplasm of unspecified part of unspecified bronchus or lung: Secondary | ICD-10-CM

## 2015-03-31 DIAGNOSIS — G893 Neoplasm related pain (acute) (chronic): Secondary | ICD-10-CM | POA: Diagnosis not present

## 2015-03-31 DIAGNOSIS — Z86718 Personal history of other venous thrombosis and embolism: Secondary | ICD-10-CM

## 2015-03-31 DIAGNOSIS — C3491 Malignant neoplasm of unspecified part of right bronchus or lung: Secondary | ICD-10-CM

## 2015-03-31 DIAGNOSIS — Z5112 Encounter for antineoplastic immunotherapy: Secondary | ICD-10-CM

## 2015-03-31 DIAGNOSIS — Z79899 Other long term (current) drug therapy: Secondary | ICD-10-CM | POA: Diagnosis not present

## 2015-03-31 DIAGNOSIS — Z86711 Personal history of pulmonary embolism: Secondary | ICD-10-CM

## 2015-03-31 LAB — COMPREHENSIVE METABOLIC PANEL (CC13)
ALBUMIN: 2.8 g/dL — AB (ref 3.5–5.0)
AST: 10 U/L (ref 5–34)
Alkaline Phosphatase: 66 U/L (ref 40–150)
Anion Gap: 9 mEq/L (ref 3–11)
BUN: 10.9 mg/dL (ref 7.0–26.0)
CALCIUM: 10.3 mg/dL (ref 8.4–10.4)
CHLORIDE: 106 meq/L (ref 98–109)
CO2: 27 mEq/L (ref 22–29)
CREATININE: 0.7 mg/dL (ref 0.7–1.3)
EGFR: 90 mL/min/{1.73_m2} — ABNORMAL LOW (ref >90)
GLUCOSE: 107 mg/dL (ref 70–140)
POTASSIUM: 3.8 meq/L (ref 3.5–5.1)
SODIUM: 141 meq/L (ref 136–145)
Total Bilirubin: 0.71 mg/dL (ref 0.20–1.20)
Total Protein: 6.7 g/dL (ref 6.4–8.3)

## 2015-03-31 LAB — CBC WITH DIFFERENTIAL/PLATELET
BASO%: 0.3 % (ref 0.0–2.0)
Basophils Absolute: 0 10*3/uL (ref 0.0–0.1)
EOS%: 3 % (ref 0.0–7.0)
Eosinophils Absolute: 0.2 10*3/uL (ref 0.0–0.5)
HEMATOCRIT: 27.9 % — AB (ref 38.4–49.9)
HGB: 8.9 g/dL — ABNORMAL LOW (ref 13.0–17.1)
LYMPH%: 5.9 % — ABNORMAL LOW (ref 14.0–49.0)
MCH: 27.4 pg (ref 27.2–33.4)
MCHC: 32 g/dL (ref 32.0–36.0)
MCV: 85.7 fL (ref 79.3–98.0)
MONO#: 0.5 10*3/uL (ref 0.1–0.9)
MONO%: 8.8 % (ref 0.0–14.0)
NEUT%: 82 % — AB (ref 39.0–75.0)
NEUTROS ABS: 4.7 10*3/uL (ref 1.5–6.5)
PLATELETS: 229 10*3/uL (ref 140–400)
RBC: 3.25 10*6/uL — ABNORMAL LOW (ref 4.20–5.82)
RDW: 18 % — ABNORMAL HIGH (ref 11.0–14.6)
WBC: 5.7 10*3/uL (ref 4.0–10.3)
lymph#: 0.3 10*3/uL — ABNORMAL LOW (ref 0.9–3.3)

## 2015-03-31 LAB — TSH CHCC: TSH: 3.833 m(IU)/L (ref 0.320–4.118)

## 2015-03-31 MED ORDER — PROCHLORPERAZINE MALEATE 10 MG PO TABS
ORAL_TABLET | ORAL | Status: AC
  Filled 2015-03-31: qty 1

## 2015-03-31 MED ORDER — SODIUM CHLORIDE 0.9 % IV SOLN
2.0000 mg/kg | Freq: Once | INTRAVENOUS | Status: AC
  Administered 2015-03-31: 150 mg via INTRAVENOUS
  Filled 2015-03-31: qty 6

## 2015-03-31 MED ORDER — OXYCODONE HCL 5 MG PO TABS: 5.0000 mg | ORAL_TABLET | Freq: Four times a day (QID) | ORAL | Status: DC | PRN

## 2015-03-31 MED ORDER — MIRTAZAPINE 30 MG PO TABS: 30.0000 mg | ORAL_TABLET | Freq: Every day | ORAL | Status: AC

## 2015-03-31 MED ORDER — PROCHLORPERAZINE MALEATE 10 MG PO TABS
10.0000 mg | ORAL_TABLET | Freq: Once | ORAL | Status: AC
  Administered 2015-03-31: 10 mg via ORAL

## 2015-03-31 MED ORDER — RIVAROXABAN 20 MG PO TABS: 20.0000 mg | ORAL_TABLET | Freq: Every day | ORAL | Status: DC

## 2015-03-31 MED ORDER — SODIUM CHLORIDE 0.9 % IV SOLN
Freq: Once | INTRAVENOUS | Status: AC
  Administered 2015-03-31: 11:00:00 via INTRAVENOUS

## 2015-03-31 NOTE — Progress Notes (Signed)
[facility] [phone]   [phone]  OFFICE PROGRESS NOTE  [name],[name] [name], MD [place] [address]  DIAGNOSIS: Non-small cell carcinoma of lung, stage 4  Primary site: Lung (Right)  Staging method: AJCC 7th Edition  Clinical: Stage IV (T1a, N3, M1b) signed by Curt Bears, MD on 02/01/2014 1:42 PM  Summary: Stage IV (T1a, N3, M1b) Prostate cancer  Primary site: Prostate  Clinical: Stage I (T1c, NX, M0) signed by Wyatt Portela, MD on 08/06/2013 1:59 PM  Summary: Stage I (T1c, NX, M0)  PRIOR THERAPY:  1) Systemic chemotherapy with carboplatin for an AUC of 5 and Alimta 500 mg per meter squared given every 3 weeks. Status post 6 cycles, last dose was given 05/20/2014 discontinued secondary to disease progression. 2) palliative radiotherapy to the right lung and lumbar spines under the care of Dr. Sondra Come. 3) status post craniotomy with tumor excision under the care of Dr. Vertell Limber on 12/04/2014.  CURRENT THERAPY:  1) Immunotherapy with Ketruda (pembrolizumab) 2 MG/KG every 3 weeks. First dose 07/29/2014. He has positive PDL 1 expression (90%). Status post 6 cycles and his treatment is currently on hold secondary to the recent metastatic brain lesion and surgery. He will resume cycle #8 today. 2) Xarelto 20 mg by mouth daily for deep venous thrombosis and pulmonary embolism.  INTERVAL HISTORY: Nicholas Schmitt 79 y.o. male returns to the clinic today for follow-up visit accompanied by his wife. He is feeling fine today with no specific complaints except for fatigue and severely low back pain with radiation to the right leg.[name] He continues to feel cold all the time secondary to the anemia. He is currently on Xarelto for history of the venous thrombosis and pulmonary embolism He is currently on Lovenox for deep venous thrombosis and pulmonary embolism. He denied having any significant fever or chills, no nausea or  vomiting. The patient denied having any significant chest pain, shortness of breath except with exertion, cough or hemoptysis. He is tolerating his current treatment with immunotherapy fairly well with no significant adverse effects. He had repeat CT scan of the chest, abdomen and pelvis performed recently and he is here for evaluation and discussion of his scan results and treatment options.  MEDICAL HISTORY: Past Medical History  Diagnosis Date  . CAD (coronary artery disease)     positive stress test in 2006 led to left heart cath (8/06) showing 95% prox RCA, 90% CFX, and 90% mLAD.  patient had a cypher DES to all 3 lesions  . Hyperlipidemia   . Hearing loss   . AAA (abdominal aortic aneurysm) ([facility] [facility]) 11/09    3.2 cm   . Chronic renal insufficiency   . History of radiation therapy 03/08/04- 04/08/04    prostate 4600 cGy in 3 fractions, radioactive seed implant 05/04/04  . Myocardial infarction ([place]) 1995  . Pacemaker 2014  . Shortness of breath     exertion  . COPD (chronic obstructive pulmonary disease) ([facility])   . Radiation Jan.11-Jan 29/2016    Right central chest 35 gray in 14 fractions  . Radiation Jan.2016    35 gray in 14 fx lower lumbar/upper sacrum  . Diverticulosis   . Hemorrhoids   . Pneumonia     hx  . GERD (gastroesophageal reflux disease)   . Hypertension     no med now for 3-4 months  . Cancer [facility]) 2015    prostate   . Prostate cancer (  [place]) 2005    gleason 7  . Skin cancer     ear  . Lung cancer ([place])   . Cancer [facility]) December 04, 2014    Cranial     ALLERGIES:  is allergic to oxycodone hcl.  MEDICATIONS:  Current Outpatient Prescriptions  Medication Sig Dispense Refill  . famotidine (PEPCID) 20 MG tablet Take 20 mg by mouth at bedtime.    . fluticasone (FLONASE) 50 MCG/ACT nasal spray USE 2 SPRAYS IN EACH NOSTRIL AT NIGHT  5  . furosemide (LASIX) 20 MG tablet TAKE 1 TABLET (20 MG TOTAL) BY MOUTH DAILY. AS NEEDED FOR SWELLING. 30 tablet 0  . mirtazapine  (REMERON) 30 MG tablet Take 1 tablet (30 mg total) by mouth at bedtime. 30 tablet 2  . Multiple Minerals (CALCIUM-MAGNESIUM-ZINC) TABS Take 1 tablet by mouth daily.    . Multiple Vitamin (MULTIVITAMIN WITH MINERALS) TABS tablet Take 1 tablet by mouth daily.    [name] omeprazole (PRILOSEC) 40 MG capsule Take 40 mg by mouth daily before supper.   0  . oxyCODONE (OXY IR/ROXICODONE) 5 MG immediate release tablet Take 1 tablet (5 mg total) by mouth every 6 (six) hours as needed for severe pain. 30 tablet 0  . Rivaroxaban (XARELTO STARTER PACK) 15 & 20 MG TBPK Take as directed on package: Start with one '15mg'$  tablet by mouth twice a day with food. On Day 22, switch to one '20mg'$  tablet once a day with food. 51 each 0  . Sennosides (EX-LAX) 15 MG CHEW Chew 15 mg by mouth at bedtime.    . tamsulosin (FLOMAX) 0.4 MG CAPS capsule Take 0.4 mg by mouth at bedtime.   2  . albuterol (PROVENTIL,VENTOLIN) 90 MCG/ACT inhaler Inhale 2 puffs into the lungs every 6 (six) hours as needed for wheezing or shortness of breath.    . budesonide-formoterol (SYMBICORT) 160-4.5 MCG/ACT inhaler Inhale 2 puffs into the lungs 2 (two) times daily. (Patient not taking: Reported on 03/31/2015)    . nitroGLYCERIN (NITROSTAT) 0.4 MG SL tablet Place 0.4 mg under the tongue every 5 (five) minutes as needed for chest pain (MAX 3 TABLETS).    [name] prochlorperazine (COMPAZINE) 10 MG tablet Take 10 mg by mouth every 6 (six) hours as needed for nausea or vomiting.     No current facility-administered medications for this visit.   Facility-Administered Medications Ordered in Other Visits  Medication Dose Route Frequency Provider Last Rate Last Dose  . heparin lock flush 100 unit/mL  500 Units Intracatheter Daily PRN Adrena E Johnson, PA-C      . sodium chloride 0.9 % injection 10 mL  10 mL Intracatheter PRN Adrena E Johnson, PA-C      . sodium chloride 0.9 % injection 3 mL  3 mL Intracatheter PRN Carlton Adam, PA-C        SURGICAL HISTORY:    Past Surgical History  Procedure Laterality Date  . Ptca  2006  . Coronary stent placement    . Permanent pacemaker insertion Bilateral 02/26/13    MDT Adapta L implanted by Dr Rayann Heman for sick sinus syndrome  . Radioactive seed implant  05/04/2004    8000 cGy, Dr Danny Lawless Dr Reece Agar  . Elbow surgery Bilateral 1973  . Coronary angioplasty    . Mediastinoscopy N/A 01/28/2014    Procedure: MEDIASTINOSCOPY;  Surgeon: Melrose Nakayama, MD;  Location: [facility];  Service: Thoracic;  Laterality: N/A;  . Permanent pacemaker insertion N/A 02/26/2013    Procedure: PERMANENT  PACEMAKER INSERTION;  Surgeon: [name], MD;  Location: [facility] CATH LAB;  Service: Cardiovascular;  Laterality: N/A;  . Insert / replace / remove pacemaker      not removed  . Craniotomy N/A 12/04/2014    Procedure: CRANIOTOMY TUMOR EXCISION, LEFT;  Surgeon: [name], MD;  Location: [facility] NEURO ORS;  Service: Neurosurgery;  Laterality: N/A;  CRANIOTOMY TUMOR EXCISION  . Application of cranial navigation  12/04/2014    Procedure: APPLICATION OF CRANIAL NAVIGATION;  Surgeon: [name], MD;  Location: [facility] NEURO ORS;  Service: Neurosurgery;;    REVIEW OF SYSTEMS:  Constitutional: positive for fatigue Eyes: negative Ears, nose, mouth, throat, and face: negative Respiratory: negative Cardiovascular: negative Gastrointestinal: negative Genitourinary:negative Integument/breast: negative Hematologic/lymphatic: negative Musculoskeletal:positive for back pain Neurological: negative Behavioral/Psych: negative Endocrine: negative Allergic/Immunologic: negative   PHYSICAL EXAMINATION: General appearance: alert, cooperative, fatigued and no distress Head: Normocephalic, without obvious abnormality, atraumatic Neck: no adenopathy, no carotid bruit, supple, symmetrical, trachea midline and thyroid not enlarged, symmetric, no tenderness/mass/nodules Lymph nodes: Cervical, supraclavicular, and axillary nodes normal. Resp: clear  to auscultation bilaterally Back: symmetric, no curvature. ROM normal. No CVA tenderness. Cardio: regular rate and rhythm, S1, S2 normal, no murmur, click, rub or gallop GI: soft, non-tender; bowel sounds normal; no masses,  no organomegaly Extremities: extremities normal, atraumatic, no cyanosis or edema Neurologic: Alert and oriented X 3, normal strength and tone. Normal symmetric reflexes. Normal coordination and gait  ECOG PERFORMANCE STATUS: 2 - Symptomatic, <50% confined to bed  Blood pressure 115/66, pulse 110, temperature 98.5 F (36.9 C), temperature source Oral, resp. rate 18, height 6' (1.829 m), weight 170 lb 6.4 oz (77.293 kg), SpO2 92 %.  LABORATORY DATA: Lab Results  Component Value Date   WBC 5.7 03/31/2015   HGB 8.9* 03/31/2015   HCT 27.9* 03/31/2015   MCV 85.7 03/31/2015   PLT 229 03/31/2015      Chemistry      Component Value Date/Time   NA 142 03/10/2015 0914   NA 135 11/28/2014 1353   K 3.9 03/10/2015 0914   K 4.8 11/28/2014 1353   CL 101 11/28/2014 1353   CO2 27 03/10/2015 0914   CO2 25 11/28/2014 1353   BUN 8.9 03/10/2015 0914   BUN 26* 11/28/2014 1353   CREATININE 0.7 03/10/2015 0914   CREATININE 0.73 11/28/2014 1353      Component Value Date/Time   CALCIUM 9.7 03/10/2015 0914   CALCIUM 9.6 11/28/2014 1353   ALKPHOS 72 03/10/2015 0914   ALKPHOS 66 07/21/2014 1821   AST 10 03/10/2015 0914   AST 43* 07/21/2014 1821   ALT 9 03/10/2015 0914   ALT 37 07/21/2014 1821   BILITOT 0.53 03/10/2015 0914   BILITOT 1.2 07/21/2014 1821       RADIOGRAPHIC STUDIES: Ct Head W Contrast  03/13/2015  CLINICAL DATA:  79 year old male with metastatic lung cancer (brain) post tumor excision and stereotactic radiation therapy June 2016. Chemotherapy. Pacemaker in place. Hyperlipidemia. Hypertension. Prostate cancer. Subsequent encounter. EXAM: CT HEAD WITH CONTRAST TECHNIQUE: Contiguous axial images were obtained from the base of the skull through the vertex with  intravenous contrast. CONTRAST:  132m OMNIPAQUE IOHEXOL 300 MG/ML  SOLN COMPARISON:  12/05/2014 and 11/26/2014 head CT. FINDINGS: Post left craniotomy for resection of metastatic lesion at the junction of the left temporal -frontal -parietal lobe with encephalomalacia. Currently, no intracranial enhancing lesion or osseous destructive lesion noted. Global atrophy without hydrocephalus. No CT evidence of large acute infarct. No intracranial hemorrhage.  Mild exophthalmos. Bilateral mild dural calcifications unchanged. Minimal mucosal thickening maxillary sinuses. IMPRESSION: Post left craniotomy for resection of metastatic lesion at the junction of the left temporal -frontal -parietal lobe with encephalomalacia. Currently, no intracranial enhancing lesion or osseous destructive lesion noted. Minimal mucosal thickening maxillary sinuses. Electronically Signed   By: Genia Del M.D.   On: 03/13/2015 13:35   Ct Soft Tissue Neck W Contrast  03/27/2015  CLINICAL DATA:  Restaging metastatic prostate cancer and lung cancer. Also history of brain tumor. EXAM: CT NECK WITH CONTRAST TECHNIQUE: Multidetector CT imaging of the neck was performed using the standard protocol following the bolus administration of intravenous contrast. CONTRAST:  142m OMNIPAQUE IOHEXOL 300 MG/ML  SOLN COMPARISON:  None FINDINGS: Pharynx and larynx: No mucosal or submucosal lesion. Salivary glands: Parotid and submandibular glands are normal. Thyroid: Normal Lymph nodes: No enlarged or low-density nodes. Vascular: Atherosclerosis of the carotid bifurcations. Limited intracranial: Negative Visualized orbits: Normal Mastoids and visualized paranasal sinuses: Clear Skeleton: Ordinary spondylosis IMPRESSION: Negative CT scan of the neck.  No evidence of tumor in that region. Electronically Signed   By: MNelson ChimesM.D.   On: 03/27/2015 11:40   Ct Chest W Contrast  03/27/2015  CLINICAL DATA:  Patient with history of lung and prostate cancer.  Restaging evaluation. EXAM: CT CHEST, ABDOMEN, AND PELVIS WITH CONTRAST TECHNIQUE: Multidetector CT imaging of the chest, abdomen and pelvis was performed following the standard protocol during bolus administration of intravenous contrast. CONTRAST:  1013mOMNIPAQUE IOHEXOL 300 MG/ML  SOLN COMPARISON:  CT chest abdomen pelvis 01/22/2015 FINDINGS: CT CHEST FINDINGS Mediastinum/Nodes: Normal heart size. Coronary arterial vascular calcifications. Re- demonstrated soft tissue infiltration of the mediastinum, grossly stable from prior including a 1.9 cm area within the right peritracheal region (image 15; series 2), previously 1.9 cm. Within the right lower lobe pulmonary arteries there are filling defects at the segmental level compatible with acute pulmonary emboli (image 30; series 2). There is persistent narrowing of superior vena cava with multiple collateral vessels along the right chest and abdominal wall soft tissues as well as associated edema. Lungs/Pleura: Soft tissue filling defect within the proximal trachea (image 9; series 4) most compatible with mucus. Re- demonstrated moderate right-sided pleural effusion. No significant interval change in appearance of right paramediastinal radiation fibrosis. The left lung is clear. No pneumothorax. CT ABDOMEN PELVIS FINDINGS Hepatobiliary: Unchanged low-attenuation lesions within the liver compatible with hepatic cysts. Gallbladder is unremarkable. No intrahepatic or extrahepatic biliary ductal dilatation. Pancreas: Unremarkable Spleen: Unremarkable Adrenals/Urinary Tract: Stable bilateral adrenal gland nodularity. Kidneys enhance symmetrically with contrast. Stable bilateral low-attenuation renal lesions, the largest of which are compatible with simple cyst. Urinary bladder is unremarkable. Small 2 mm calcification superior pole left kidney. Stomach/Bowel: Sigmoid colonic diverticulosis. No CT evidence for acute diverticulitis. No abnormal bowel wall thickening or  evidence for bowel obstruction. No free fluid or free intraperitoneal air. Vascular/Lymphatic: Peripheral calcified atherosclerotic plaque involving the abdominal aorta. Infrarenal abdominal aortic aneurysm measuring 3.5 x 3.2 cm, unchanged (image 83; series 2). Reproductive: Multiple seeds within the prostate. Other:  None Musculoskeletal: Re- demonstrated right sacral ala fracture. Interval increase in size of soft tissue lytic metastasis involving the L5 vertebral body measuring 4.6 x 5.0 cm, previously 2.3 x 1.4 cm (image 89; series 2). Additionally there is an increased epidural soft tissue component (image 66; series 603) which causes narrowing of the spinal canal at this level. IMPRESSION: Interval increase in size of soft tissue lytic metastasis involving the L5  vertebral body. There is epidural extension into the spinal canal which causes narrowing of the canal at this level. Findings compatible with acute pulmonary emboli within the right lower lobe segmental pulmonary arteries. Grossly unchanged paramediastinal right lung radiation fibrosis with stable soft tissue infiltration into the right aspect of the mediastinum. There is persistent mass effect on the superior vena cava with multiple collateral vessels throughout the right chest and abdominal wall soft tissues. Stable infrarenal abdominal aortic aneurysm. Critical Value/emergent results were called by telephone at the time of interpretation on 03/27/2015 at 12:14 pm to Dr. Julien Nordmann, who verbally acknowledged these results. Electronically Signed   By: Lovey Newcomer M.D.   On: 03/27/2015 12:15   Ct Abdomen Pelvis W Contrast  03/27/2015  CLINICAL DATA:  Patient with history of lung and prostate cancer. Restaging evaluation. EXAM: CT CHEST, ABDOMEN, AND PELVIS WITH CONTRAST TECHNIQUE: Multidetector CT imaging of the chest, abdomen and pelvis was performed following the standard protocol during bolus administration of intravenous contrast. CONTRAST:   146m OMNIPAQUE IOHEXOL 300 MG/ML  SOLN COMPARISON:  CT chest abdomen pelvis 01/22/2015 FINDINGS: CT CHEST FINDINGS Mediastinum/Nodes: Normal heart size. Coronary arterial vascular calcifications. Re- demonstrated soft tissue infiltration of the mediastinum, grossly stable from prior including a 1.9 cm area within the right peritracheal region (image 15; series 2), previously 1.9 cm. Within the right lower lobe pulmonary arteries there are filling defects at the segmental level compatible with acute pulmonary emboli (image 30; series 2). There is persistent narrowing of superior vena cava with multiple collateral vessels along the right chest and abdominal wall soft tissues as well as associated edema. Lungs/Pleura: Soft tissue filling defect within the proximal trachea (image 9; series 4) most compatible with mucus. Re- demonstrated moderate right-sided pleural effusion. No significant interval change in appearance of right paramediastinal radiation fibrosis. The left lung is clear. No pneumothorax. CT ABDOMEN PELVIS FINDINGS Hepatobiliary: Unchanged low-attenuation lesions within the liver compatible with hepatic cysts. Gallbladder is unremarkable. No intrahepatic or extrahepatic biliary ductal dilatation. Pancreas: Unremarkable Spleen: Unremarkable Adrenals/Urinary Tract: Stable bilateral adrenal gland nodularity. Kidneys enhance symmetrically with contrast. Stable bilateral low-attenuation renal lesions, the largest of which are compatible with simple cyst. Urinary bladder is unremarkable. Small 2 mm calcification superior pole left kidney. Stomach/Bowel: Sigmoid colonic diverticulosis. No CT evidence for acute diverticulitis. No abnormal bowel wall thickening or evidence for bowel obstruction. No free fluid or free intraperitoneal air. Vascular/Lymphatic: Peripheral calcified atherosclerotic plaque involving the abdominal aorta. Infrarenal abdominal aortic aneurysm measuring 3.5 x 3.2 cm, unchanged (image 83;  series 2). Reproductive: Multiple seeds within the prostate. Other:  None Musculoskeletal: Re- demonstrated right sacral ala fracture. Interval increase in size of soft tissue lytic metastasis involving the L5 vertebral body measuring 4.6 x 5.0 cm, previously 2.3 x 1.4 cm (image 89; series 2). Additionally there is an increased epidural soft tissue component (image 66; series 603) which causes narrowing of the spinal canal at this level. IMPRESSION: Interval increase in size of soft tissue lytic metastasis involving the L5 vertebral body. There is epidural extension into the spinal canal which causes narrowing of the canal at this level. Findings compatible with acute pulmonary emboli within the right lower lobe segmental pulmonary arteries. Grossly unchanged paramediastinal right lung radiation fibrosis with stable soft tissue infiltration into the right aspect of the mediastinum. There is persistent mass effect on the superior vena cava with multiple collateral vessels throughout the right chest and abdominal wall soft tissues. Stable infrarenal abdominal aortic aneurysm. Critical  Value/emergent results were called by telephone at the time of interpretation on 03/27/2015 at 12:14 pm to Dr. Julien Nordmann, who verbally acknowledged these results. Electronically Signed   By: Lovey Newcomer M.D.   On: 03/27/2015 12:15    ASSESSMENT AND PLAN: this is a very pleasant 79 years old white male with stage IV non-small cell lung cancer, adenocarcinoma with positive PDL 1 expression, completed systemic chemotherapy with carboplatin and Alimta status post 6 cycles and tolerated his treatment fairly well.  He is currently on treatment with immunotherapy with Ketruda (pembrolizumab) status post 10 cycles with stable disease in the chest, abdomen and pelvis except for the progressive bone metastasis at L5 vertebral body and new acute pulmonary embolism. I discussed the scan results with the patient and his girlfriend today. I  recommended for him to proceed with cycle #11 today as a scheduled. For the progressive bone lesion at L5, I referred the patient to Dr. Sondra Come for consideration of palliative radiotherapy to this area. For the recurrent pulmonary embolism, I recommended for the patient to continue his treatment with Xarelto and I will arrange for him to have IVC filter placed by interventional radiology. For the patient, he was started on Remeron 30 mg by mouth daily at bedtime and feeling much better. For pain management, he was given a refill of OxyIR 5 mg by mouth every 6 hours as needed. The patient would come back for follow-up visit in 3 weeks for reevaluation before the next cycle of his treatment. He was advised to call if he has any concerning symptoms in the interval. The patient was advised to call immediately if he has any concerning symptoms in the interval. The patient voices understanding of current disease status and treatment options and is in agreement with the current care plan.  All questions were answered. The patient knows to call the clinic with any problems, questions or concerns. We can certainly see the patient much sooner if necessary.  Disclaimer: This note was dictated with voice recognition software. Similar sounding words can inadvertently be transcribed and may not be corrected upon review.

## 2015-03-31 NOTE — Progress Notes (Signed)
Nutrition follow-up completed with patient during infusion for lung cancer. Weight decreased and documented as 170.4 pounds October 18, down from 172.2 pounds September 27. Patient reports increased pain. He is tolerating yogurt, chicken noodle soup. He occasionally drinks Ensure Plus or boost plus and still has coupons from previous visit.  Nutrition diagnosis: Food and nutrition related knowledge deficit improved.  Intervention:  Patient educated to consume Ensure Plus or boost plus twice a day to minimize further weight loss. Reviewed high-calorie high-protein foods. Teach back method was used.  Monitoring, evaluation, goals: Patient will tolerate adding oral nutrition supplements twice a day for weight stabilization.  Next visit: To be scheduled as needed with upcoming treatments.  Patient has my contact information for questions or concerns.  **Disclaimer: This note was dictated with voice recognition software. Similar sounding words can inadvertently be transcribed and this note may contain transcription errors which may not have been corrected upon publication of note.**

## 2015-04-01 ENCOUNTER — Encounter: Payer: Self-pay | Admitting: Radiation Oncology

## 2015-04-01 ENCOUNTER — Telehealth: Payer: Self-pay | Admitting: Internal Medicine

## 2015-04-01 NOTE — Progress Notes (Signed)
Histology and Location of Primary Cancer: non-small cell carcinoma of lung, stage IV  Sites of Visceral and Bony Metastatic Disease: History of prostate cancer. Small right adrenal mass, right upper lobe nodule as well as extensive thoracic lymphadenopathy right supraclavicular lymph node. In addition, there was adrenal metastasis and metastasis involving the right L5 vertebral body and right sacrum.  Location(s) of Symptomatic Metastases: Interval increase in size of soft tissue lytic metastasis involving the L5 vertebral body. There is epidural extension into the spinal canal which causes narrowing of the canal at this level.  Past/Anticipated chemotherapy by medical oncology, if any:  PRIOR THERAPY:  1) Systemic chemotherapy with carboplatin for an AUC of 5 and Alimta 500 mg per meter squared given every 3 weeks. Status post 6 cycles, last dose was given 05/20/2014 discontinued secondary to disease progression. 2) palliative radiotherapy to the right lung and lumbar spines under the care of Dr. Sondra Come. 3) status post craniotomy with tumor excision under the care of Dr. Vertell Limber on 12/04/2014.  CURRENT THERAPY:  1) Immunotherapy with Ketruda (pembrolizumab) 2 MG/KG every 3 weeks. First dose 07/29/2014. He has positive PDL 1 expression (90%). Status post 6 cycles and his treatment is currently on hold secondary to the recent metastatic brain lesion and surgery. He will resume cycle #8 today. 2) Xarelto 20 mg by mouth daily for deep venous thrombosis and pulmonary embolism.   Pain on a scale of 0-10 is: reports severe low back pain 8-9 on a scale of 0-10 which radiates down his right leg into his right foot. Reports taking oxycodone IR 5 mg every six hours for pain.    If Spine Met(s), symptoms, if any, include:  Bowel/Bladder retention or incontinence (please describe): denies bladder incontinence; reports he has been  unable to have a bowel movement for five days; provided patient with  constipation management worksheets  and reviewed the information   Numbness or weakness in extremities (please describe): denies numbness of extremities; reports generalized  weakness  Current Decadron regimen, if applicable: No  Ambulatory status? Walker? Wheelchair?: Wheelchair. Reports he ambulates very little because "it difficulty to stand then, sit back down.  SAFETY ISSUES:  Prior radiation? yes  Pacemaker/ICD? yes  Possible current pregnancy? no  Is the patient on methotrexate? no  Current Complaints / other details:  80 year old male. Referred back by Dr. Julien Nordmann for progressive bone lesion at L5, for consideration of palliative radiotherapy. Reports intermittent nausea and vomiting for which he takes compazine. Having filter placed Wednesday because blood clots were found in the patients right arm, right leg and lung.

## 2015-04-01 NOTE — Telephone Encounter (Signed)
Spoke with patients girlfriend and she is aware of the appointments

## 2015-04-06 ENCOUNTER — Ambulatory Visit
Admission: RE | Admit: 2015-04-06 | Discharge: 2015-04-06 | Disposition: A | Discharge status: Home / Self Care | Payer: Medicare Other | Source: Ambulatory Visit | Attending: Radiation Oncology | Admitting: Radiation Oncology

## 2015-04-06 ENCOUNTER — Encounter: Payer: Self-pay | Admitting: Radiation Oncology

## 2015-04-06 VITALS — BP 95/60 | HR 108 | Resp 18 | Ht 72.0 in | Wt 170.0 lb

## 2015-04-06 DIAGNOSIS — Z510 Encounter for antineoplastic radiation therapy: Secondary | ICD-10-CM | POA: Insufficient documentation

## 2015-04-06 DIAGNOSIS — G893 Neoplasm related pain (acute) (chronic): Secondary | ICD-10-CM | POA: Insufficient documentation

## 2015-04-06 DIAGNOSIS — Z923 Personal history of irradiation: Secondary | ICD-10-CM | POA: Insufficient documentation

## 2015-04-06 DIAGNOSIS — C7949 Secondary malignant neoplasm of other parts of nervous system: Secondary | ICD-10-CM

## 2015-04-06 DIAGNOSIS — Z9221 Personal history of antineoplastic chemotherapy: Secondary | ICD-10-CM | POA: Diagnosis not present

## 2015-04-06 DIAGNOSIS — C7951 Secondary malignant neoplasm of bone: Principal | ICD-10-CM

## 2015-04-06 DIAGNOSIS — C7931 Secondary malignant neoplasm of brain: Secondary | ICD-10-CM | POA: Insufficient documentation

## 2015-04-06 DIAGNOSIS — C3411 Malignant neoplasm of upper lobe, right bronchus or lung: Secondary | ICD-10-CM | POA: Diagnosis present

## 2015-04-06 DIAGNOSIS — C3490 Malignant neoplasm of unspecified part of unspecified bronchus or lung: Secondary | ICD-10-CM

## 2015-04-06 NOTE — Progress Notes (Signed)
See progress note under physician encounter. 

## 2015-04-06 NOTE — Progress Notes (Signed)
Radiation Oncology         [phone]) [phone] ________________________________    Name: Nicholas Schmitt MRN: [id number]  Date: 04/06/2015  DOB: [birth year]  Re-Consultation Visit Note  CC: [name], MD  Curt Bears, MD  Diagnosis: 79 yo gentleman with a L5 metastasis from adenocarinoma of the right upper lung    ICD-9-CM ICD-10-CM   1. Neoplasm related pain 338.3 G89.3     Interval Since Last Radiation: 1. External radiation followed by Seed implant in 02/22/2004 by Dr Danny Lawless. Currently on hormone therapy for local failure in 2014. 2. Radiation to the chest and lumbar spine January 2016 by Dr. Sondra Come 3. 11/27/14: Left Operculum 20 mm target was treated to a prescription dose of 18 Gy on 11/27/14.  Narrative:  The patient returns today for a re-consultation. He has been treated for Stage IV adenocarcinoma of the right lung with systemic chemotherapy, courses of localized radiotherapy to the right lung and lumbar spine. He also had stereotactic radiosurgery for brain metastasis. Most recently, he has been receiving immunotherapy with stable disease, except for a progressive metastatic lesion at L5 in the lumbar spine. The pt previously received conventional radiation treatment to the lumbar spine from 06/25/14 until 07/11/14 receiving 30 Gy in 12 fractions. These fields included L4, L5, and S1. On reviewing his previous radiation, there has been clear progression of the lytic soft tissue metastasis involving L5 which now shows extension into the spinal canal. The patient had a CT of the chest, abdomen/pelvis, and soft tissue of the neck on 03/27/15.  The patient complains of extreme lower back pain.  Radiation Treatment Planning Image from 1/16 showing lytic lesion in right side of L5 with surrounding radiation dose levels:    Current CT Image showing progressive lytic and soft tissue disease in right side of L5:    ALLERGIES:  is allergic to oxycodone  hcl.  Meds: Current Outpatient Prescriptions  Medication Sig Dispense Refill  . albuterol (PROVENTIL,VENTOLIN) 90 MCG/ACT inhaler Inhale 2 puffs into the lungs every 6 (six) hours as needed for wheezing or shortness of breath.    . budesonide-formoterol (SYMBICORT) 160-4.5 MCG/ACT inhaler Inhale 2 puffs into the lungs 2 (two) times daily.    . famotidine (PEPCID) 20 MG tablet Take 20 mg by mouth at bedtime.    . fluticasone (FLONASE) 50 MCG/ACT nasal spray USE 2 SPRAYS IN EACH NOSTRIL AT NIGHT  5  . mirtazapine (REMERON) 30 MG tablet Take 1 tablet (30 mg total) by mouth at bedtime. 30 tablet 2  . Multiple Minerals (CALCIUM-MAGNESIUM-ZINC) TABS Take 1 tablet by mouth daily.    . Multiple Vitamin (MULTIVITAMIN WITH MINERALS) TABS tablet Take 1 tablet by mouth daily.    . naproxen sodium (ANAPROX) 220 MG tablet Take 440 mg by mouth 2 (two) times daily as needed (pain).    . nitroGLYCERIN (NITROSTAT) 0.4 MG SL tablet Place 0.4 mg under the tongue every 5 (five) minutes as needed for chest pain (MAX 3 TABLETS).    [name] omeprazole (PRILOSEC) 40 MG capsule Take 40 mg by mouth daily before supper.   0  . oxyCODONE (OXY IR/ROXICODONE) 5 MG immediate release tablet Take 1 tablet (5 mg total) by mouth every 6 (six) hours as needed for severe pain. 30 tablet 0  . prochlorperazine (COMPAZINE) 10 MG tablet Take 10 mg by mouth every 6 (six) hours as needed for nausea or vomiting.    . rivaroxaban (XARELTO) 20 MG TABS tablet Take 1 tablet (20  mg total) by mouth daily with supper. 30 tablet 2  . Sennosides (EX-LAX) 15 MG CHEW Chew 15 mg by mouth at bedtime.    . tamsulosin (FLOMAX) 0.4 MG CAPS capsule Take 0.4 mg by mouth at bedtime.   2  . ALPRAZolam (XANAX) 0.25 MG tablet Take 0.25 mg by mouth 2 (two) times daily as needed for anxiety.    . furosemide (LASIX) 20 MG tablet TAKE 1 TABLET (20 MG TOTAL) BY MOUTH DAILY. AS NEEDED FOR SWELLING. (Patient not taking: Reported on 04/06/2015) 30 tablet 0   No current  facility-administered medications for this encounter.   Facility-Administered Medications Ordered in Other Encounters  Medication Dose Route Frequency Provider Last Rate Last Dose  . heparin lock flush 100 unit/mL  500 Units Intracatheter Daily PRN Adrena E Johnson, PA-C      . sodium chloride 0.9 % injection 10 mL  10 mL Intracatheter PRN Adrena E Johnson, PA-C      . sodium chloride 0.9 % injection 3 mL  3 mL Intracatheter PRN Carlton Adam, PA-C        Physical Findings: The patient is in no acute distress. Patient is alert and oriented.  height is 6' (1.829 m) and weight is 170 lb (77.111 kg). His blood pressure is 95/60 and his pulse is 108. His respiration is 18 and oxygen saturation is 92%.  Patient presents to the clinic in a wheelchair.  MS 5/5 throughout with no LE deficits to light touch.  Lab Findings: Lab Results  Component Value Date   WBC 5.7 03/31/2015   WBC 2.2 Repeated and verified X2.* 12/29/2014   HGB 8.9* 03/31/2015   HGB 10.6* 12/29/2014   HCT 27.9* 03/31/2015   HCT 31.3* 12/29/2014   PLT 229 03/31/2015   PLT 59.0 Repeated and verified X2.* 12/29/2014    Lab Results  Component Value Date   NA 141 03/31/2015   NA 135 11/28/2014   K 3.8 03/31/2015   K 4.8 11/28/2014   CHLORIDE 106 03/31/2015   CO2 27 03/31/2015   CO2 25 11/28/2014   GLUCOSE 107 03/31/2015   GLUCOSE 111* 11/28/2014   BUN 10.9 03/31/2015   BUN 26* 11/28/2014   CREATININE 0.7 03/31/2015   CREATININE 0.73 11/28/2014   BILITOT 0.71 03/31/2015   BILITOT 1.2 07/21/2014   ALKPHOS 66 03/31/2015   ALKPHOS 66 07/21/2014   AST 10 03/31/2015   AST 43* 07/21/2014   ALT <9 03/31/2015   ALT 37 07/21/2014   PROT 6.7 03/31/2015   PROT 6.8 07/21/2014   ALBUMIN 2.8* 03/31/2015   ALBUMIN 3.0* 07/21/2014   CALCIUM 10.3 03/31/2015   CALCIUM 9.6 11/28/2014   ANIONGAP 9 03/31/2015   ANIONGAP 9 11/28/2014    Impression: 79 yo gentleman with an isolated recurrent metastasis at L5 with previous  conventional radiation at that level. The pt may benefit from stereotactic radiosurgery for salvage.  Plan: Today, I talked to the patient and family about the findings and work-up thus far.  We discussed the natural history of an isolated recurrent metastasis at L5 with previous conventional radiation at that level and general treatment, highlighting the role of radiotherapy in the management.  We discussed the available radiation techniques, and focused on the details of logistics and delivery.  We reviewed the anticipated acute and late sequelae associated with radiation in this setting.  The patient was encouraged to ask questions that I answered to the best of my ability. The patient would like to  proceed with radiation and will be scheduled for CT simulation.  I spent 30 minutes minutes face to face with the patient and more than 50% of that time was spent in counseling and/or coordination of care.  This document serves as a record of services personally performed by [name], MD. It was created on his behalf by Darcus Austin, a trained medical scribe. The creation of this record is based on the scribe's personal observations and the provider's statements to them. This document has been checked and approved by the attending provider.     _____________________________________  Sheral Apley. Tammi Klippel, M.D.

## 2015-04-06 NOTE — Progress Notes (Signed)
  Radiation Oncology         [phone] ________________________________  Name: Nicholas Schmitt MRN: [id number]  Date: 04/06/2015  DOB: [birth year]  STEREOTACTIC BODY RADIOTHERAPY SIMULATION AND TREATMENT PLANNING NOTE    ICD-9-CM ICD-10-CM   1. Metastasis to L5 in previous radiation field 198.3 C79.49     DIAGNOSIS:  79 yo man with isolated recurrent L5 metastasis in previously irradiated field.  NARRATIVE:  The patient was brought to the [facility].  Identity was confirmed.  All relevant records and images related to the planned course of therapy were reviewed.  The patient freely provided informed written consent to proceed with treatment after reviewing the details related to the planned course of therapy. The consent form was witnessed and verified by the simulation staff.  Then, the patient was set-up in a stable reproducible  supine position for radiation therapy.  A BodyFix immobilization pillow was fabricated for reproducible positioning.  Surface markings were placed.  The CT images were loaded into the planning software.  The gross target volumes (GTV) and planning target volumes (PTV) were delinieated, and avoidance structures were contoured.  Treatment planning then occurred.  The radiation prescription was entered and confirmed.  A total of two complex treatment devices were fabricated in the form of the BodyFix immobilization pillow and a neck accuform cushion.  I have requested : 3D Simulation  I have requested a DVH of the following structures: targets and all normal structures near the target as outlined on the radiation plan to maintain doses in adherence with established limits  PLAN:  The patient will receive 18 Gy in 1 fraction.  ________________________________  Sheral Apley Tammi Klippel, M.D.

## 2015-04-07 ENCOUNTER — Other Ambulatory Visit: Payer: Self-pay | Admitting: Physician Assistant

## 2015-04-08 ENCOUNTER — Ambulatory Visit: Payer: Medicare Other | Admitting: Physical Therapy

## 2015-04-08 ENCOUNTER — Encounter (HOSPITAL_COMMUNITY): Payer: Self-pay

## 2015-04-08 ENCOUNTER — Ambulatory Visit (HOSPITAL_COMMUNITY)
Admission: RE | Admit: 2015-04-08 | Discharge: 2015-04-08 | Disposition: A | Discharge status: Home / Self Care | Payer: Medicare Other | Source: Ambulatory Visit | Attending: Internal Medicine | Admitting: Internal Medicine

## 2015-04-08 ENCOUNTER — Ambulatory Visit: Payer: Medicare Other

## 2015-04-08 DIAGNOSIS — Z86718 Personal history of other venous thrombosis and embolism: Secondary | ICD-10-CM | POA: Insufficient documentation

## 2015-04-08 DIAGNOSIS — C3490 Malignant neoplasm of unspecified part of unspecified bronchus or lung: Secondary | ICD-10-CM | POA: Insufficient documentation

## 2015-04-08 DIAGNOSIS — Z7901 Long term (current) use of anticoagulants: Secondary | ICD-10-CM | POA: Diagnosis not present

## 2015-04-08 DIAGNOSIS — J449 Chronic obstructive pulmonary disease, unspecified: Secondary | ICD-10-CM | POA: Insufficient documentation

## 2015-04-08 DIAGNOSIS — I129 Hypertensive chronic kidney disease with stage 1 through stage 4 chronic kidney disease, or unspecified chronic kidney disease: Secondary | ICD-10-CM | POA: Insufficient documentation

## 2015-04-08 DIAGNOSIS — I714 Abdominal aortic aneurysm, without rupture: Secondary | ICD-10-CM | POA: Diagnosis not present

## 2015-04-08 DIAGNOSIS — Z5112 Encounter for antineoplastic immunotherapy: Secondary | ICD-10-CM

## 2015-04-08 DIAGNOSIS — Z87891 Personal history of nicotine dependence: Secondary | ICD-10-CM | POA: Insufficient documentation

## 2015-04-08 DIAGNOSIS — E785 Hyperlipidemia, unspecified: Secondary | ICD-10-CM | POA: Diagnosis not present

## 2015-04-08 DIAGNOSIS — C3491 Malignant neoplasm of unspecified part of right bronchus or lung: Secondary | ICD-10-CM

## 2015-04-08 DIAGNOSIS — N189 Chronic kidney disease, unspecified: Secondary | ICD-10-CM | POA: Diagnosis not present

## 2015-04-08 DIAGNOSIS — C61 Malignant neoplasm of prostate: Secondary | ICD-10-CM | POA: Diagnosis not present

## 2015-04-08 DIAGNOSIS — K219 Gastro-esophageal reflux disease without esophagitis: Secondary | ICD-10-CM | POA: Diagnosis not present

## 2015-04-08 DIAGNOSIS — Z950 Presence of cardiac pacemaker: Secondary | ICD-10-CM | POA: Insufficient documentation

## 2015-04-08 DIAGNOSIS — I2699 Other pulmonary embolism without acute cor pulmonale: Secondary | ICD-10-CM | POA: Insufficient documentation

## 2015-04-08 DIAGNOSIS — C7931 Secondary malignant neoplasm of brain: Secondary | ICD-10-CM

## 2015-04-08 DIAGNOSIS — Z408 Encounter for other prophylactic surgery: Secondary | ICD-10-CM | POA: Diagnosis present

## 2015-04-08 DIAGNOSIS — I252 Old myocardial infarction: Secondary | ICD-10-CM | POA: Diagnosis not present

## 2015-04-08 DIAGNOSIS — I2510 Atherosclerotic heart disease of native coronary artery without angina pectoris: Secondary | ICD-10-CM | POA: Diagnosis not present

## 2015-04-08 LAB — CBC
HCT: 27.9 % — ABNORMAL LOW (ref 39.0–52.0)
Hemoglobin: 8.4 g/dL — ABNORMAL LOW (ref 13.0–17.0)
MCH: 26.8 pg (ref 26.0–34.0)
MCHC: 30.1 g/dL (ref 30.0–36.0)
MCV: 89.1 fL (ref 78.0–100.0)
PLATELETS: 238 10*3/uL (ref 150–400)
RBC: 3.13 MIL/uL — AB (ref 4.22–5.81)
RDW: 17.1 % — AB (ref 11.5–15.5)
WBC: 6.9 10*3/uL (ref 4.0–10.5)

## 2015-04-08 LAB — PROTIME-INR
INR: 1.36 (ref 0.00–1.49)
PROTHROMBIN TIME: 16.9 s — AB (ref 11.6–15.2)

## 2015-04-08 LAB — APTT: APTT: 35 s (ref 24–37)

## 2015-04-08 MED ORDER — CEFAZOLIN SODIUM-DEXTROSE 2-3 GM-% IV SOLR: 2.0000 g | Freq: Once | INTRAVENOUS | Status: DC

## 2015-04-08 MED ORDER — MIDAZOLAM HCL 2 MG/2ML IJ SOLN
INTRAMUSCULAR | Status: AC | PRN
  Administered 2015-04-08: 0.5 mg via INTRAVENOUS

## 2015-04-08 MED ORDER — FENTANYL CITRATE (PF) 100 MCG/2ML IJ SOLN
INTRAMUSCULAR | Status: AC | PRN
  Administered 2015-04-08: 25 ug via INTRAVENOUS

## 2015-04-08 MED ORDER — MIDAZOLAM HCL 2 MG/2ML IJ SOLN
INTRAMUSCULAR | Status: AC
  Filled 2015-04-08: qty 4

## 2015-04-08 MED ORDER — LIDOCAINE HCL 1 % IJ SOLN
INTRAMUSCULAR | Status: AC
  Filled 2015-04-08: qty 20

## 2015-04-08 MED ORDER — CEFAZOLIN SODIUM-DEXTROSE 2-3 GM-% IV SOLR
INTRAVENOUS | Status: AC
  Filled 2015-04-08: qty 50

## 2015-04-08 MED ORDER — SODIUM CHLORIDE 0.9 % IV SOLN: INTRAVENOUS | Status: DC

## 2015-04-08 MED ORDER — FENTANYL CITRATE (PF) 100 MCG/2ML IJ SOLN
INTRAMUSCULAR | Status: AC
  Filled 2015-04-08: qty 2

## 2015-04-08 NOTE — Procedures (Signed)
Interventional Radiology Procedure Note  Procedure:  IVC filter placement  Complications:  None  Estimated Blood Loss:  < 10 mL  IVC normally patent.  Bard Lavinia IVC filter placed in infrarenal IVC.  Venetia Night. Kathlene Cote, M.D Pager:  [phone]

## 2015-04-08 NOTE — Discharge Instructions (Signed)
Inferior Vena Cava Filter Insertion, Care After Refer to this sheet in the next few weeks. These instructions provide you with information on caring for yourself after your procedure. Your health care provider may also give you more specific instructions. Your treatment has been planned according to current medical practices, but problems sometimes occur. Call your health care provider if you have any problems or questions after your procedure. WHAT TO EXPECT AFTER THE PROCEDURE After your procedure, it is typical to have the following:  Mild pain in the area where the filter was inserted.  Mild bruising in the area where the filter was inserted. HOME CARE INSTRUCTIONS  You will be given medicine to control pain. Only take over-the-counter or prescription medicines for pain, fever, or discomfort as directed by your health care provider.  A bandage (dressing) has been placed over the insertion site. Follow your health care provider's instructions on how to care for it.  Keep the insertion site clean and dry.  Do not soak in a bath tub or pool until the filter insertion site has healed.  Do not drive if you are taking narcotic pain medicines. Follow your health care provider's instructions about driving.  Do not return to work or school until your health care provider says it is okay.   Keep all follow-up appointments.  SEEK IMMEDIATE MEDICAL CARE IF:  You develop swelling and discoloration or pain in the legs.  Your legs become pale and cold or blue.  You develop shortness of breath, feel faint, or pass out.  You develop chest pain, a cough, or difficulty breathing.  You cough up blood.  You develop a rash or feel you are having problems that may be a side effect of medicines.  You develop weakness, difficulty moving your arms or legs, or balance problems.  You develop problems with speech or vision.   This information is not intended to replace advice given to you by your  health care provider. Make sure you discuss any questions you have with your health care provider.   Document Released: 03/20/2013 Document Reviewed: 03/20/2013 Elsevier Interactive Patient Education 2016 [employer]. Inferior Vena Cava Filter Insertion Insertion of an inferior vena cava (IVC) filter is a procedure in which a filter is placed into the large vein in your abdomen that carries blood from the lower part of your body to your heart (inferior vena cava). Placement of the filter here helps prevent blood clots in the legs or pelvis from traveling to your lungs. A large blood clot in the lungs can cause death.  The filter is a small, metal device about an inch long. It is shaped like the spokes of an umbrella and is inserted through a pathway created in your neck or groin. The risks of this procedure are usually small and easily managed. Inferior vena cava filters are only used when blood thinners (anticoagulants) cannot be used to prevent blood clots from forming. This may occur because of:  You have severe platelet problems or shortage.  You have had recent or current major bleeding that cannot be treated.  You have bleeding associated with anticoagulants.  You have recurrence of blood clots while on anticoagulants.  You have a need for surgery in the near future.  You have bleeding in your head. EXPECTATIONS OF A FILTER  An IVC filter will reduce the risk of a large blood clot making its way to your lungs (pulmonary embolus, or PE). It cannot eliminate the risk completely, or prevent  small PEs from occurring.  It does not stop blood clots from growing, recurring, or developing into postphlebitic syndrome. This is a condition that can occur after there is inflammation in a vein (phlebitis). LET [facility] CARE PROVIDER KNOW ABOUT:  Any allergies you have. This includes an allergy to iodine or contrast dye.  All medicines you are taking, including vitamins, herbs, eye drops,  creams and over-the-counter medicines.  Previous problems you or members of your family have had with the use of anesthetics.  Any blood disorders you have.  Previous surgeries you had had.  Medical conditions you have.  Possibility of pregnancy, if this applies. RISKS AND COMPLICATIONS Generally, this is a safe procedure. However, as with any procedure, problems can occur. Possible problems include:  A small bruise around the needle insertion site. A larger pooling of blood called a hematoma may form. This is usually of no concern.  The filter can block the vena cava. This can cause some swelling of the legs.  The filter may eventually fail and not work properly.  Continued bleeding or infections (uncommon).  Damage to the vein by the catheter (rare). BEFORE THE PROCEDURE  Your health care provider may want you to have blood tests. These tests can help tell how well your kidneys and liver are working. They can also show how well your blood clots.  If you take blood thinners, ask your health care provider if and when you should stop taking them.  Do not eat or drink for 4 hours before the procedure or as directed by your health care provider.  Make arrangements for someone to drive you home. Depending on the procedure, you may be able to go home the same day.  PROCEDURE   Insertion of a Vena Cava filter is performed by a vascular surgeon or cardiologist in a cardiac catheterization lab.  The procedure usually takes about 30 minutes to 1 hour. This can vary.  An IV needle will be inserted into one of your veins. Medicine will be able to flow directly into your body through this needle.  Medicines may given to help you relax and relieve anxiety (sedative).  The procedure is done through a large vein either in your neck or groin. The skin around this area is cleaned and shaved, as necessary.  The skin and deeper tissues over the vein will be made numb with a local  anesthetic. You are awake during the procedure and can let your health care providers know if you have discomfort.  A needle is then put into the vein. A guide wire is placed through the needle and into the vein. This is used to help insert a catheter and the IVC filter into your vein.  Contrast dye may be injected into the inferior vena cava to help guide the catheter and verify precise placement of the IVC filter. X-ray equipment may also be used to verify that the catheter and the wire are in the correct position.  The wire is then withdrawn.  The IVC filter is then passed over the catheter into the vein, and inserted into the correct location in the vena cava.  The catheter is then removed. Pressure will be kept on the needle insertion point for several minutes or until it is unlikely to bleed. AFTER THE PROCEDURE  You will stay in a recovery area until any sedation medicine you were given has worn off. Your blood pressure and pulse will be checked.  If there are no problems,  you should be able to go home after the procedure.  You may feel sore at the area of the needle insertion for a few days.   This information is not intended to replace advice given to you by your health care provider. Make sure you discuss any questions you have with your health care provider.   Document Released: 07/20/2005 Document Revised: 06/20/2014 Document Reviewed: 02/04/2013 Elsevier Interactive Patient Education 2016 [employer]   Moderate Conscious Sedation, Adult, Care After Refer to this sheet in the next few weeks. These instructions provide you with information on caring for yourself after your procedure. Your health care provider may also give you more specific instructions. Your treatment has been planned according to current medical practices, but problems sometimes occur. Call your health care provider if you have any problems or questions after your procedure. WHAT TO EXPECT AFTER THE  PROCEDURE  After your procedure:  You may feel sleepy, clumsy, and have poor balance for several hours.  Vomiting may occur if you eat too soon after the procedure. HOME CARE INSTRUCTIONS  Do not participate in any activities where you could become injured for at least 24 hours. Do not:  Drive.  Swim.  Ride a bicycle.  Operate heavy machinery.  Cook.  Use power tools.  Climb ladders.  Work from a high place.  Do not make important decisions or sign legal documents until you are improved.  If you vomit, drink water, juice, or soup when you can drink without vomiting. Make sure you have little or no nausea before eating solid foods.  Only take over-the-counter or prescription medicines for pain, discomfort, or fever as directed by your health care provider.  Make sure you and your family fully understand everything about the medicines given to you, including what side effects may occur.  You should not drink alcohol, take sleeping pills, or take medicines that cause drowsiness for at least 24 hours.  If you smoke, do not smoke without supervision.  If you are feeling better, you may resume normal activities 24 hours after you were sedated.  Keep all appointments with your health care provider. SEEK MEDICAL CARE IF:  Your skin is pale or bluish in color.  You continue to feel nauseous or vomit.  Your pain is getting worse and is not helped by medicine.  You have bleeding or swelling.  You are still sleepy or feeling clumsy after 24 hours. SEEK IMMEDIATE MEDICAL CARE IF:  You develop a rash.  You have difficulty breathing.  You develop any type of allergic problem.  You have a fever. MAKE SURE YOU:  Understand these instructions.  Will watch your condition.  Will get help right away if you are not doing well or get worse.   This information is not intended to replace advice given to you by your health care provider. Make sure you discuss any questions  you have with your health care provider.   Document Released: 03/20/2013 Document Revised: 06/20/2014 Document Reviewed: 03/20/2013 Elsevier Interactive Patient Education [employer].

## 2015-04-08 NOTE — H&P (Signed)
Chief Complaint: Patient was seen in consultation today for IVC filter placement   Referring Physician(s): Mohamed,Mohamed  History of Present Illness: Nicholas Schmitt is a 79 y.o. male with significant past medical history including coronary artery disease, AAA, chronic renal insufficiency, pacemaker, COPD, GERD, as well as skin/ prostate and stage IV non-small cell lung cancer. Patient also has history of right lower extremity DVT noted in the popliteal, gastrocnemius, posterior tibial and peroneal veins and recent CT revealing interval increase in size of soft tissue lytic metastasis involving the L5 vertebral body as well as acute pulmonary emboli within the right lower lobe. There is persistent mass effect on the SVC with multiple collateral vessels throughout the right chest abdominal wall soft tissues. Patient has been on xarelto as well as Lovenox. In view of recurrent PE on anticoagulation IVC filter has now been requested and patient presents today for the above procedure.  Past Medical History  Diagnosis Date  . CAD (coronary artery disease)     positive stress test in 2006 led to left heart cath (8/06) showing 95% prox RCA, 90% CFX, and 90% mLAD.  patient had a cypher DES to all 3 lesions  . Hyperlipidemia   . Hearing loss   . AAA (abdominal aortic aneurysm) ([place]) 11/09    3.2 cm   . Chronic renal insufficiency   . History of radiation therapy 03/08/04- 04/08/04    prostate 4600 cGy in 3 fractions, radioactive seed implant 05/04/04  . Myocardial infarction ([place]) 1995  . Pacemaker 2014  . Shortness of breath     exertion  . COPD (chronic obstructive pulmonary disease) ([place])   . Radiation Jan.11-Jan 29/2016    Right central chest 35 gray in 14 fractions  . Radiation Jan.2016    35 gray in 14 fx lower lumbar/upper sacrum  . Diverticulosis   . Hemorrhoids   . Pneumonia     hx  . GERD (gastroesophageal reflux disease)   . Hypertension     no med now for 3-4 months    . Prostate cancer ([place]) 2005    gleason 7  . Skin cancer     ear  . Lung cancer ([place])   . Cancer [facility]) 2015    prostate   . Cancer [facility]) December 04, 2014    Cranial   . Cancer [facility]) 04/01/2015    spine    Past Surgical History  Procedure Laterality Date  . Ptca  2006  . Coronary stent placement    . Permanent pacemaker insertion Bilateral 02/26/13    MDT Adapta L implanted by Dr Rayann Heman for sick sinus syndrome  . Radioactive seed implant  05/04/2004    8000 cGy, Dr Danny Lawless Dr Reece Agar  . Elbow surgery Bilateral 1973  . Coronary angioplasty    . Mediastinoscopy N/A 01/28/2014    Procedure: MEDIASTINOSCOPY;  Surgeon: Melrose Nakayama, MD;  Location: [place];  Service: Thoracic;  Laterality: N/A;  . Permanent pacemaker insertion N/A 02/26/2013    Procedure: PERMANENT PACEMAKER INSERTION;  Surgeon: [name], MD;  Location: [place] CATH LAB;  Service: Cardiovascular;  Laterality: N/A;  . Insert / replace / remove pacemaker      not removed  . Craniotomy N/A 12/04/2014    Procedure: CRANIOTOMY TUMOR EXCISION, LEFT;  Surgeon: [name], MD;  Location: [place] NEURO ORS;  Service: Neurosurgery;  Laterality: N/A;  CRANIOTOMY TUMOR EXCISION  . Application of cranial navigation  12/04/2014    Procedure: APPLICATION  OF CRANIAL NAVIGATION;  Surgeon: [name], MD;  Location: [place] NEURO ORS;  Service: Neurosurgery;;    Allergies: Oxycodone hcl  Medications: Prior to Admission medications   Medication Sig Start Date End Date Taking? Authorizing Provider  albuterol (PROVENTIL,VENTOLIN) 90 MCG/ACT inhaler Inhale 2 puffs into the lungs every 6 (six) hours as needed for wheezing or shortness of breath. 03/21/11  Yes Dorena Cookey, MD  ALPRAZolam [name]) 0.25 MG tablet Take 0.25 mg by mouth 2 (two) times daily as needed for anxiety.   Yes Historical Provider, MD  budesonide-formoterol (SYMBICORT) 160-4.5 MCG/ACT inhaler Inhale 2 puffs into the lungs 2 (two) times daily. 07/25/14  Yes [name], MD  famotidine (PEPCID) 20 MG tablet Take 20 mg by mouth at bedtime.   Yes Historical Provider, MD  fluticasone (FLONASE) 50 MCG/ACT nasal spray USE 2 SPRAYS IN EACH NOSTRIL AT NIGHT 03/10/15  Yes Historical Provider, MD  furosemide (LASIX) 20 MG tablet TAKE 1 TABLET (20 MG TOTAL) BY MOUTH DAILY. AS NEEDED FOR SWELLING. 03/10/15  Yes Adrena E Johnson, PA-C  mirtazapine (REMERON) 30 MG tablet Take 1 tablet (30 mg total) by mouth at bedtime. 03/31/15  Yes Curt Bears, MD  Multiple Minerals (CALCIUM-MAGNESIUM-ZINC) TABS Take 1 tablet by mouth daily.   Yes Historical Provider, MD  Multiple Vitamin (MULTIVITAMIN WITH MINERALS) TABS tablet Take 1 tablet by mouth daily.   Yes Historical Provider, MD  naproxen sodium (ANAPROX) 220 MG tablet Take 440 mg by mouth 2 (two) times daily as needed (pain).   Yes Historical Provider, MD  nitroGLYCERIN (NITROSTAT) 0.4 MG SL tablet Place 0.4 mg under the tongue every 5 (five) minutes as needed for chest pain (MAX 3 TABLETS). 06/05/13  Yes Dorena Cookey, MD  omeprazole (PRILOSEC) 40 MG capsule Take 40 mg by mouth daily before supper.  09/12/14  Yes Historical Provider, MD  oxyCODONE (OXY IR/ROXICODONE) 5 MG immediate release tablet Take 1 tablet (5 mg total) by mouth every 6 (six) hours as needed for severe pain. 03/31/15  Yes Curt Bears, MD  prochlorperazine (COMPAZINE) 10 MG tablet Take 10 mg by mouth every 6 (six) hours as needed for nausea or vomiting.   Yes Historical Provider, MD  rivaroxaban (XARELTO) 20 MG TABS tablet Take 1 tablet (20 mg total) by mouth daily with supper. 03/31/15  Yes Curt Bears, MD  Sennosides (EX-LAX) 15 MG CHEW Chew 15 mg by mouth at bedtime.   Yes Historical Provider, MD  tamsulosin (FLOMAX) 0.4 MG CAPS capsule Take 0.4 mg by mouth at bedtime.  10/09/14  Yes Historical Provider, MD     Family History  Problem Relation Age of Onset  . Asthma Sister   . Asthma Brother   . Coronary artery disease Other     Social  History   Social History  . Marital Status: Divorced    Spouse Name: N/A  . Number of Children: 3  . Years of Education: N/A   Occupational History  . Retired    Social History Main Topics  . Smoking status: Former Smoker -- 3.00 packs/day for 35 years    Types: Cigarettes    Quit date: 07/30/1993  . Smokeless tobacco: Never Used  . Alcohol Use: No     Comment: hx alcohol abuse per note dated 04/11/11  . Drug Use: No  . Sexual Activity: No   Other Topics Concern  . None   Social History Narrative    ECOG Status:   Review of Systems  Constitutional: Negative for fever and chills.  Respiratory:       Occasional cough and some dyspnea with exertion  Cardiovascular: Negative for chest pain.  Gastrointestinal: Negative for nausea, vomiting, abdominal pain and blood in stool.  Genitourinary: Negative for dysuria and hematuria.  Musculoskeletal: Positive for back pain.  Neurological: Negative for headaches.    Vital Signs: BP 104/71 mmHg  Pulse 109  Temp(Src) 98.2 F (36.8 C) (Oral)  Resp 16  Ht '6\' 1"'$  (1.854 m)  Wt 170 lb (77.111 kg)  BMI 22.43 kg/m2  SpO2 97%  Physical Exam  Constitutional: He is oriented to person, place, and time. He appears well-developed and well-nourished.  Cardiovascular: Normal rate and regular rhythm.   Pulmonary/Chest: Effort normal.  Slightly diminished breath sounds right base, left clear  Abdominal: Soft. Bowel sounds are normal. There is no tenderness.  Musculoskeletal: He exhibits no edema.  Neurological: He is alert and oriented to person, place, and time.    Mallampati Score:     Imaging: Ct Head W Contrast  03/13/2015  CLINICAL DATA:  79 year old male with metastatic lung cancer (brain) post tumor excision and stereotactic radiation therapy June 2016. Chemotherapy. Pacemaker in place. Hyperlipidemia. Hypertension. Prostate cancer. Subsequent encounter. EXAM: CT HEAD WITH CONTRAST TECHNIQUE: Contiguous axial images were  obtained from the base of the skull through the vertex with intravenous contrast. CONTRAST:  185m OMNIPAQUE IOHEXOL 300 MG/ML  SOLN COMPARISON:  12/05/2014 and 11/26/2014 head CT. FINDINGS: Post left craniotomy for resection of metastatic lesion at the junction of the left temporal -frontal -parietal lobe with encephalomalacia. Currently, no intracranial enhancing lesion or osseous destructive lesion noted. Global atrophy without hydrocephalus. No CT evidence of large acute infarct. No intracranial hemorrhage. Mild exophthalmos. Bilateral mild dural calcifications unchanged. Minimal mucosal thickening maxillary sinuses. IMPRESSION: Post left craniotomy for resection of metastatic lesion at the junction of the left temporal -frontal -parietal lobe with encephalomalacia. Currently, no intracranial enhancing lesion or osseous destructive lesion noted. Minimal mucosal thickening maxillary sinuses. Electronically Signed   By: SGenia DelM.D.   On: 03/13/2015 13:35   Ct Soft Tissue Neck W Contrast  03/27/2015  CLINICAL DATA:  Restaging metastatic prostate cancer and lung cancer. Also history of brain tumor. EXAM: CT NECK WITH CONTRAST TECHNIQUE: Multidetector CT imaging of the neck was performed using the standard protocol following the bolus administration of intravenous contrast. CONTRAST:  1064mOMNIPAQUE IOHEXOL 300 MG/ML  SOLN COMPARISON:  None FINDINGS: Pharynx and larynx: No mucosal or submucosal lesion. Salivary glands: Parotid and submandibular glands are normal. Thyroid: Normal Lymph nodes: No enlarged or low-density nodes. Vascular: Atherosclerosis of the carotid bifurcations. Limited intracranial: Negative Visualized orbits: Normal Mastoids and visualized paranasal sinuses: Clear Skeleton: Ordinary spondylosis IMPRESSION: Negative CT scan of the neck.  No evidence of tumor in that region. Electronically Signed   By: MaNelson Chimes.D.   On: 03/27/2015 11:40   Ct Chest W Contrast  03/27/2015   CLINICAL DATA:  Patient with history of lung and prostate cancer. Restaging evaluation. EXAM: CT CHEST, ABDOMEN, AND PELVIS WITH CONTRAST TECHNIQUE: Multidetector CT imaging of the chest, abdomen and pelvis was performed following the standard protocol during bolus administration of intravenous contrast. CONTRAST:  10061mMNIPAQUE IOHEXOL 300 MG/ML  SOLN COMPARISON:  CT chest abdomen pelvis 01/22/2015 FINDINGS: CT CHEST FINDINGS Mediastinum/Nodes: Normal heart size. Coronary arterial vascular calcifications. Re- demonstrated soft tissue infiltration of the mediastinum, grossly stable from prior including a 1.9 cm area within the right peritracheal region (  image 15; series 2), previously 1.9 cm. Within the right lower lobe pulmonary arteries there are filling defects at the segmental level compatible with acute pulmonary emboli (image 30; series 2). There is persistent narrowing of superior vena cava with multiple collateral vessels along the right chest and abdominal wall soft tissues as well as associated edema. Lungs/Pleura: Soft tissue filling defect within the proximal trachea (image 9; series 4) most compatible with mucus. Re- demonstrated moderate right-sided pleural effusion. No significant interval change in appearance of right paramediastinal radiation fibrosis. The left lung is clear. No pneumothorax. CT ABDOMEN PELVIS FINDINGS Hepatobiliary: Unchanged low-attenuation lesions within the liver compatible with hepatic cysts. Gallbladder is unremarkable. No intrahepatic or extrahepatic biliary ductal dilatation. Pancreas: Unremarkable Spleen: Unremarkable Adrenals/Urinary Tract: Stable bilateral adrenal gland nodularity. Kidneys enhance symmetrically with contrast. Stable bilateral low-attenuation renal lesions, the largest of which are compatible with simple cyst. Urinary bladder is unremarkable. Small 2 mm calcification superior pole left kidney. Stomach/Bowel: Sigmoid colonic diverticulosis. No CT evidence  for acute diverticulitis. No abnormal bowel wall thickening or evidence for bowel obstruction. No free fluid or free intraperitoneal air. Vascular/Lymphatic: Peripheral calcified atherosclerotic plaque involving the abdominal aorta. Infrarenal abdominal aortic aneurysm measuring 3.5 x 3.2 cm, unchanged (image 83; series 2). Reproductive: Multiple seeds within the prostate. Other:  None Musculoskeletal: Re- demonstrated right sacral ala fracture. Interval increase in size of soft tissue lytic metastasis involving the L5 vertebral body measuring 4.6 x 5.0 cm, previously 2.3 x 1.4 cm (image 89; series 2). Additionally there is an increased epidural soft tissue component (image 66; series 603) which causes narrowing of the spinal canal at this level. IMPRESSION: Interval increase in size of soft tissue lytic metastasis involving the L5 vertebral body. There is epidural extension into the spinal canal which causes narrowing of the canal at this level. Findings compatible with acute pulmonary emboli within the right lower lobe segmental pulmonary arteries. Grossly unchanged paramediastinal right lung radiation fibrosis with stable soft tissue infiltration into the right aspect of the mediastinum. There is persistent mass effect on the superior vena cava with multiple collateral vessels throughout the right chest and abdominal wall soft tissues. Stable infrarenal abdominal aortic aneurysm. Critical Value/emergent results were called by telephone at the time of interpretation on 03/27/2015 at 12:14 pm to Dr. Julien Nordmann, who verbally acknowledged these results. Electronically Signed   By: Lovey Newcomer M.D.   On: 03/27/2015 12:15   Ct Abdomen Pelvis W Contrast  03/27/2015  CLINICAL DATA:  Patient with history of lung and prostate cancer. Restaging evaluation. EXAM: CT CHEST, ABDOMEN, AND PELVIS WITH CONTRAST TECHNIQUE: Multidetector CT imaging of the chest, abdomen and pelvis was performed following the standard protocol  during bolus administration of intravenous contrast. CONTRAST:  156m OMNIPAQUE IOHEXOL 300 MG/ML  SOLN COMPARISON:  CT chest abdomen pelvis 01/22/2015 FINDINGS: CT CHEST FINDINGS Mediastinum/Nodes: Normal heart size. Coronary arterial vascular calcifications. Re- demonstrated soft tissue infiltration of the mediastinum, grossly stable from prior including a 1.9 cm area within the right peritracheal region (image 15; series 2), previously 1.9 cm. Within the right lower lobe pulmonary arteries there are filling defects at the segmental level compatible with acute pulmonary emboli (image 30; series 2). There is persistent narrowing of superior vena cava with multiple collateral vessels along the right chest and abdominal wall soft tissues as well as associated edema. Lungs/Pleura: Soft tissue filling defect within the proximal trachea (image 9; series 4) most compatible with mucus. Re- demonstrated moderate right-sided pleural effusion. No  significant interval change in appearance of right paramediastinal radiation fibrosis. The left lung is clear. No pneumothorax. CT ABDOMEN PELVIS FINDINGS Hepatobiliary: Unchanged low-attenuation lesions within the liver compatible with hepatic cysts. Gallbladder is unremarkable. No intrahepatic or extrahepatic biliary ductal dilatation. Pancreas: Unremarkable Spleen: Unremarkable Adrenals/Urinary Tract: Stable bilateral adrenal gland nodularity. Kidneys enhance symmetrically with contrast. Stable bilateral low-attenuation renal lesions, the largest of which are compatible with simple cyst. Urinary bladder is unremarkable. Small 2 mm calcification superior pole left kidney. Stomach/Bowel: Sigmoid colonic diverticulosis. No CT evidence for acute diverticulitis. No abnormal bowel wall thickening or evidence for bowel obstruction. No free fluid or free intraperitoneal air. Vascular/Lymphatic: Peripheral calcified atherosclerotic plaque involving the abdominal aorta. Infrarenal  abdominal aortic aneurysm measuring 3.5 x 3.2 cm, unchanged (image 83; series 2). Reproductive: Multiple seeds within the prostate. Other:  None Musculoskeletal: Re- demonstrated right sacral ala fracture. Interval increase in size of soft tissue lytic metastasis involving the L5 vertebral body measuring 4.6 x 5.0 cm, previously 2.3 x 1.4 cm (image 89; series 2). Additionally there is an increased epidural soft tissue component (image 66; series 603) which causes narrowing of the spinal canal at this level. IMPRESSION: Interval increase in size of soft tissue lytic metastasis involving the L5 vertebral body. There is epidural extension into the spinal canal which causes narrowing of the canal at this level. Findings compatible with acute pulmonary emboli within the right lower lobe segmental pulmonary arteries. Grossly unchanged paramediastinal right lung radiation fibrosis with stable soft tissue infiltration into the right aspect of the mediastinum. There is persistent mass effect on the superior vena cava with multiple collateral vessels throughout the right chest and abdominal wall soft tissues. Stable infrarenal abdominal aortic aneurysm. Critical Value/emergent results were called by telephone at the time of interpretation on 03/27/2015 at 12:14 pm to Dr. Julien Nordmann, who verbally acknowledged these results. Electronically Signed   By: Lovey Newcomer M.D.   On: 03/27/2015 12:15    Labs:  CBC:  Recent Labs  02/17/15 0903 03/10/15 0914 03/31/15 0921 04/08/15 1300  WBC 4.5 4.4 5.7 6.9  HGB 9.1* 9.4* 8.9* 8.4*  HCT 30.1* 29.7* 27.9* 27.9*  PLT 187 188 229 238    COAGS:  Recent Labs  04/08/15 1300  INR 1.36  APTT 35    BMP:  Recent Labs  07/21/14 1821 07/22/14 0118 07/24/14 0436  11/28/14 1353  01/27/15 0934 02/17/15 0903 03/10/15 0914 03/31/15 0921  NA 135 135 139  < > 135  < > 142 140 142 141  K 3.8 4.0 4.1  < > 4.8  < > 3.9 4.0 3.9 3.8  CL 104 105 102  --  101  --   --   --    --   --   CO2 '22 25 26  '$ < > 25  < > '26 28 27 27  '$ GLUCOSE 104* 125* 172*  < > 111*  < > 94 110 92 107  BUN '15 15 16  '$ < > 26*  < > 11.3 8.6 8.9 10.9  CALCIUM 8.9 8.8 9.3  < > 9.6  < > 9.1 9.7 9.7 10.3  CREATININE 0.74 0.73 0.74  < > 0.73  < > 0.7 0.8 0.7 0.7  GFRNONAA 87* 88* 87*  --  >60  --   --   --   --   --   GFRAA >90 >90 >90  --  >60  --   --   --   --   --   < > =  values in this interval not displayed.  LIVER FUNCTION TESTS:  Recent Labs  01/27/15 0934 02/17/15 0903 03/10/15 0914 03/31/15 0921  BILITOT 1.05 0.69 0.53 0.71  AST '13 10 10 10  '$ ALT '14 12 9 '$ <9  ALKPHOS 81 68 72 66  PROT 6.1* 6.4 6.3* 6.7  ALBUMIN 2.9* 2.8* 2.9* 2.8*    TUMOR MARKERS: No results for input(s): AFPTM, CEA, CA199, CHROMGRNA in the last 8760 hours.  Assessment and Plan: CHANCELOR HARDRICK is a 79 y.o. male with significant past medical history including coronary artery disease, AAA, chronic renal insufficiency, pacemaker, COPD, GERD, as well as skin/ prostate and stage IV non-small cell lung cancer. Patient also has history of right lower extremity DVT noted in the popliteal, gastrocnemius, posterior tibial and peroneal veins and recent CT revealing interval increase in size of soft tissue lytic metastasis involving the L5 vertebral body as well as acute pulmonary emboli within the right lower lobe. There is persistent mass effect on the SVC with multiple collateral vessels throughout the right chest abdominal wall soft tissues. Patient has been on xarelto as well as Lovenox. In view of recurrent PE (prior LLL PE 01/2015) on anticoagulation IVC filter has now been requested and patient presents today for the above procedure.Risks and benefits discussed with the patient/family including, but not limited to bleeding, infection, contrast induced renal failure, filter fracture or migration which can lead to emergency surgery or even death, strut penetration with damage or irritation to adjacent structures and caval  thrombosis.All of the patient's questions were answered, patient is agreeable to proceed.Consent signed and in chart.     Thank you for this interesting consult.  I greatly enjoyed meeting SIAH STEELY and look forward to participating in their care.  A copy of this report was sent to the requesting provider on this date.  Signed: D. Rowe Robert 04/08/2015, 2:16 PM   I spent a total of 15 minutes in face to face in clinical consultation, greater than 50% of which was counseling/coordinating care for IVC filter placement        [id number]

## 2015-04-09 ENCOUNTER — Ambulatory Visit: Payer: Medicare Other

## 2015-04-09 ENCOUNTER — Ambulatory Visit: Payer: Medicare Other | Admitting: Occupational Therapy

## 2015-04-10 ENCOUNTER — Ambulatory Visit: Payer: Medicare Other | Admitting: Physical Therapy

## 2015-04-13 ENCOUNTER — Telehealth: Payer: Self-pay | Admitting: Radiation Therapy

## 2015-04-13 ENCOUNTER — Other Ambulatory Visit: Payer: Self-pay | Admitting: Radiation Oncology

## 2015-04-13 DIAGNOSIS — C7949 Secondary malignant neoplasm of other parts of nervous system: Secondary | ICD-10-CM

## 2015-04-13 MED ORDER — DEXAMETHASONE 4 MG PO TABS: 4.0000 mg | ORAL_TABLET | Freq: Two times a day (BID) | ORAL | Status: DC

## 2015-04-13 NOTE — Telephone Encounter (Signed)
Returned a call to June. She left me a vm questioning if Jerad's SRS treatment could be moved up any sooner due to his pain and weakness in his legs. His first of 5 fractions is scheduled for 04/16/15 @ 12:00.   Pt. is complaining of increased weakness and pain in Rt lower leg traveling into his foot. He has had several recent falls due to weakness in his legs.   He is not experiencing any new numbness or loss of bladder or bowel control.   Mont Dutton

## 2015-04-13 NOTE — Telephone Encounter (Signed)
In response to the earlier call from June Willard about Nicholas Schmitt's increased pain and weakness, Dr. Tammi Klippel has e-scribed him a course of steroids. I called June to let her know and asked her to call us back if his symptoms worsen.     Dr. Johny Shears response:   He may benefit from a short course of dexamethasone to help his current symptoms and reduce the incidence of pain flare following the treatment.        I eRx 4 mg dex BID for five days.to [facility] [address]        MM          Mont Dutton  Special Procedures Navigator

## 2015-04-14 ENCOUNTER — Ambulatory Visit: Payer: Medicare Other | Admitting: Occupational Therapy

## 2015-04-14 ENCOUNTER — Ambulatory Visit: Payer: Medicare Other

## 2015-04-15 ENCOUNTER — Telehealth: Payer: Self-pay | Admitting: Radiation Oncology

## 2015-04-15 ENCOUNTER — Ambulatory Visit: Payer: Medicare Other | Admitting: Physical Therapy

## 2015-04-15 DIAGNOSIS — Z510 Encounter for antineoplastic radiation therapy: Secondary | ICD-10-CM | POA: Diagnosis not present

## 2015-04-15 NOTE — Telephone Encounter (Signed)
Phoned patient's son, Nicholas Schmitt, after being unable to reach the patient or June. Explained to Nicholas Schmitt that Decadron 4 mg twice a day for five days has been escribed by Dr. Tammi Klippel to [facility] on [address]. Explained this could help with the weakness in his leg. Nicholas Schmitt verbalized understanding and expressed he plans to go pick it up now.

## 2015-04-15 NOTE — Telephone Encounter (Signed)
Unable to reach patient or his girlfriend this RN phoned the patient's son.

## 2015-04-16 ENCOUNTER — Ambulatory Visit: Payer: Medicare Other | Admitting: Physical Therapy

## 2015-04-16 ENCOUNTER — Telehealth: Payer: Self-pay | Admitting: Radiation Oncology

## 2015-04-16 ENCOUNTER — Ambulatory Visit
Admission: RE | Admit: 2015-04-16 | Discharge: 2015-04-16 | Disposition: A | Discharge status: Home / Self Care | Payer: Medicare Other | Source: Ambulatory Visit | Attending: Radiation Oncology | Admitting: Radiation Oncology

## 2015-04-16 VITALS — BP 133/72 | HR 87 | Temp 97.8°F

## 2015-04-16 DIAGNOSIS — C7949 Secondary malignant neoplasm of other parts of nervous system: Secondary | ICD-10-CM

## 2015-04-16 DIAGNOSIS — Z510 Encounter for antineoplastic radiation therapy: Secondary | ICD-10-CM | POA: Diagnosis not present

## 2015-04-16 NOTE — Progress Notes (Signed)
Vitals taken post SRS.  Patient here for monitoring for 15 minutes.  Patient denies having any new pain or numbness/tingling in his legs.  His family is present in the room.

## 2015-04-16 NOTE — Telephone Encounter (Signed)
Per Dr. Johny Shears order called in Neurontin 100 mg tid x 5 days, 200 mg tid x 5 days, and 300 mg tid x 5 days, qty 120, and no refills. Then phoned patient's home and girlfriend's cell. No answer at either. Left message on both this was done.

## 2015-04-16 NOTE — Progress Notes (Signed)
  Radiation Oncology         [phone] ________________________________  Spinal Stereotactic Radiosurgery Procedure Note  Name: Nicholas Schmitt MRN: [id number]  Date: 04/16/2015  DOB: August 01, 1935  SPECIAL TREATMENT PROCEDURE    ICD-9-CM ICD-10-CM   1. 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  It showed 3-dimensional radiation distributions overlaid onto the planning CT/MRI image set.  The [facility] for the target structures as well as the organs at risk were reviewed. The documentation of the 3D plan and dosimetry are filed in the radiation oncology EMR.  NARRATIVE:  RIOT [place] was brought to the TrueBeam stereotactic radiation treatment machine and placed supine on the CT couch. The patient was precisely re-positioned in their BodyFix immobilization device, and the patient was set up for stereotactic radiosurgery.  Neurosurgery was present for the set-up and delivery  SIMULATION VERIFICATION:  In the couch zero-angle position, the patient underwent Exactrac imaging using the Brainlab system with orthogonal KV images to position the target accounting for translation and rotational factors.  These were carefully aligned and repeated to confirm treatment position.  Then, cone beam CT was performed to help further verify placement and make any final translational shifts.  SPECIAL TREATMENT PROCEDURE: [name] received stereotactic radiosurgery to the following targets:  The targeted metastasis in the L5 vertebral body was treated using 2 Rapid Arc VMAT Beams to a prescription dose of 9 Gy in 1 of 4 fractions to a total dose of 45 Gy. The beams were delivered with 10 MV X-rays in the flattening filter free mode.  STEREOTACTIC TREATMENT MANAGEMENT:  Following delivery,  the patient was transported to nursing in stable condition and monitored for possible acute effects.  Vital signs were recorded BP 133/72 mmHg  Pulse 87  Temp(Src) 97.8 F (36.6 C) (Oral)  SpO2 100%. The patient tolerated treatment without significant acute effects, and was discharged to home in stable condition.    PLAN: Patient will follow up until the completion of 4 treatments.  ________________________________  Sheral Apley. Tammi Klippel, M.D.    This document serves as a record of services personally performed by [name], MD. It was created on his behalf by Lendon Collar, a trained medical scribe. The creation of this record is based on the scribe's personal observations and the provider's statements to them. This document has been checked and approved by the attending provider.

## 2015-04-16 NOTE — Op Note (Signed)
   Name: Nicholas Schmitt  MRN: [id number]  Date: 04/16/2015   DOB: Dec 15, 1935  Stereotactic Radiosurgery Operative Note  PRE-OPERATIVE DIAGNOSIS:  Spinal Metastasis  POST-OPERATIVE DIAGNOSIS:  Spinal Metastasis  PROCEDURE:  Stereotactic Radiosurgery  SURGEON:  Peggyann Shoals, MD  NARRATIVE: The patient underwent a radiation treatment planning session in the radiation oncology simulation suite under the care of the radiation oncology physician and physicist.  I participated closely in the radiation treatment planning afterwards. The patient underwent planning CT myelogram which was fused to the MRI.  These images were fused on the planning system.  Radiation oncology contoured the gross target volume and subsequently expanded this to yield the Planning Target Volume. I actively participated in the planning process.  I helped to define and review the target contours and also the contours of the spinal cord, and selected nearby organs at risk.  All the dose constraints for critical structures were reviewed and compared to AAPM Task Group 101.  The prescription dose conformity was reviewed.  I approved the plan electronically.    Accordingly, [name] was brought to the TrueBeam stereotactic radiation treatment linac and placed in the custom immobilization device.  The patient was aligned according to the IR fiducial markers with BrainLab Exactrac, then orthogonal x-rays were used in ExacTrac with the 6DOF robotic table and the shifts were made to align the patient.  Then conebeam CT was performed to verify precision.  [name] received stereotactic radiosurgery uneventfully.  The detailed description of the procedure is recorded in the radiation oncology procedure note.  I was present for the duration of the procedure.  DISPOSITION:  Following delivery, the patient was transported to nursing in stable condition and monitored for possible acute effects to be discharged to home in  stable condition with follow-up in one month.  Peggyann Shoals, MD 04/16/2015 5:45 PM

## 2015-04-20 ENCOUNTER — Ambulatory Visit: Payer: Medicare Other | Admitting: Physical Therapy

## 2015-04-20 ENCOUNTER — Ambulatory Visit
Admission: RE | Admit: 2015-04-20 | Discharge: 2015-04-20 | Disposition: A | Discharge status: Home / Self Care | Payer: Medicare Other | Source: Ambulatory Visit | Attending: Radiation Oncology | Admitting: Radiation Oncology

## 2015-04-20 VITALS — BP 111/54 | HR 114 | Temp 98.2°F | Resp 18

## 2015-04-20 DIAGNOSIS — C7949 Secondary malignant neoplasm of other parts of nervous system: Secondary | ICD-10-CM

## 2015-04-20 DIAGNOSIS — Z510 Encounter for antineoplastic radiation therapy: Secondary | ICD-10-CM | POA: Diagnosis not present

## 2015-04-20 NOTE — Progress Notes (Signed)
Nurse monitoring complete following SRS treatment. Patient discharged home with his wife. Heart rate slightly elevated. Patient denies pain or other needs at this time. Wife confirms patient tapered of decadron on Saturday. Denies headache, dizziness, or nausea. Patient understands to return Wednesday for next treatment.

## 2015-04-20 NOTE — Progress Notes (Signed)
  Radiation Oncology         [phone] ________________________________  Spinal Stereotactic Radiosurgery Procedure Note  Name: MIQUAN TANDON MRN: [id number]  Date: 04/20/2015  DOB: May 15, 1936  SPECIAL TREATMENT PROCEDURE    ICD-9-CM ICD-10-CM   1. 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  It showed 3-dimensional radiation distributions overlaid onto the planning CT/MRI image set.  The [facility] for the target structures as well as the organs at risk were reviewed. The documentation of the 3D plan and dosimetry are filed in the radiation oncology EMR.  NARRATIVE:  DAVARIOUS TUMBLESON was brought to the TrueBeam stereotactic radiation treatment machine and placed supine on the CT couch. The patient was precisely re-positioned in their BodyFix immobilization device, and the patient was set up for stereotactic radiosurgery.  Neurosurgery was present for the set-up and delivery  SIMULATION VERIFICATION:  In the couch zero-angle position, the patient underwent Exactrac imaging using the Brainlab system with orthogonal KV images to position the target accounting for translation and rotational factors.  These were carefully aligned and repeated to confirm treatment position.  Then, cone beam CT was performed to help further verify placement and make any final translational shifts.  SPECIAL TREATMENT PROCEDURE: [name] received stereotactic radiosurgery to the following targets:  The targeted metastasis in the L5 vertebral body was treated using 2 Rapid Arc VMAT Beams to a prescription dose of 9 Gy in 1 of 4 fractions to a total dose of 45 Gy. The beams were delivered with 10 MV X-rays in the flattening filter free mode.  STEREOTACTIC TREATMENT MANAGEMENT:  Following delivery,  the patient was transported to nursing in stable condition and monitored for possible acute effects.  Vital signs were recorded BP 111/54 mmHg  Pulse 114  Temp(Src) 98.2 F (36.8 C) (Oral)  Resp 18  SpO2 100%. The patient tolerated treatment without significant acute effects, and was discharged to home in stable condition.    PLAN: Patient will follow up until the completion of 4 treatments.  ________________________________  Sheral Apley. Tammi Klippel, M.D.

## 2015-04-20 NOTE — Addendum Note (Signed)
Encounter addended by: Heywood Footman, RN on: 04/20/2015  2:13 PM<BR>     Documentation filed: Notes Section

## 2015-04-21 ENCOUNTER — Ambulatory Visit: Payer: Medicare Other | Admitting: Physician Assistant

## 2015-04-21 ENCOUNTER — Ambulatory Visit (HOSPITAL_BASED_OUTPATIENT_CLINIC_OR_DEPARTMENT_OTHER): Payer: Medicare Other | Admitting: Physician Assistant

## 2015-04-21 ENCOUNTER — Other Ambulatory Visit: Payer: Medicare Other

## 2015-04-21 ENCOUNTER — Encounter: Payer: Self-pay | Admitting: Physician Assistant

## 2015-04-21 ENCOUNTER — Encounter: Payer: Medicare Other | Admitting: Occupational Therapy

## 2015-04-21 ENCOUNTER — Ambulatory Visit: Payer: Medicare Other

## 2015-04-21 ENCOUNTER — Ambulatory Visit (HOSPITAL_BASED_OUTPATIENT_CLINIC_OR_DEPARTMENT_OTHER): Payer: Medicare Other

## 2015-04-21 ENCOUNTER — Other Ambulatory Visit (HOSPITAL_BASED_OUTPATIENT_CLINIC_OR_DEPARTMENT_OTHER): Payer: Medicare Other

## 2015-04-21 VITALS — BP 99/56 | HR 78 | Temp 98.3°F | Resp 18 | Ht 73.0 in | Wt 163.5 lb

## 2015-04-21 DIAGNOSIS — R530 Neoplastic (malignant) related fatigue: Secondary | ICD-10-CM | POA: Diagnosis not present

## 2015-04-21 DIAGNOSIS — C3490 Malignant neoplasm of unspecified part of unspecified bronchus or lung: Secondary | ICD-10-CM | POA: Diagnosis not present

## 2015-04-21 DIAGNOSIS — I2699 Other pulmonary embolism without acute cor pulmonale: Secondary | ICD-10-CM

## 2015-04-21 DIAGNOSIS — C3491 Malignant neoplasm of unspecified part of right bronchus or lung: Secondary | ICD-10-CM

## 2015-04-21 DIAGNOSIS — C7931 Secondary malignant neoplasm of brain: Secondary | ICD-10-CM

## 2015-04-21 DIAGNOSIS — C7951 Secondary malignant neoplasm of bone: Secondary | ICD-10-CM | POA: Diagnosis not present

## 2015-04-21 DIAGNOSIS — Z5112 Encounter for antineoplastic immunotherapy: Secondary | ICD-10-CM | POA: Diagnosis not present

## 2015-04-21 DIAGNOSIS — Z86718 Personal history of other venous thrombosis and embolism: Secondary | ICD-10-CM

## 2015-04-21 DIAGNOSIS — R531 Weakness: Secondary | ICD-10-CM

## 2015-04-21 LAB — CBC WITH DIFFERENTIAL/PLATELET
BASO%: 0.2 % (ref 0.0–2.0)
BASOS ABS: 0 10*3/uL (ref 0.0–0.1)
EOS ABS: 0.1 10*3/uL (ref 0.0–0.5)
EOS%: 1.5 % (ref 0.0–7.0)
HCT: 27.5 % — ABNORMAL LOW (ref 38.4–49.9)
HGB: 8.5 g/dL — ABNORMAL LOW (ref 13.0–17.1)
LYMPH%: 6.5 % — AB (ref 14.0–49.0)
MCH: 26.3 pg — AB (ref 27.2–33.4)
MCHC: 31 g/dL — AB (ref 32.0–36.0)
MCV: 84.9 fL (ref 79.3–98.0)
MONO#: 0.7 10*3/uL (ref 0.1–0.9)
MONO%: 8.1 % (ref 0.0–14.0)
NEUT%: 83.7 % — AB (ref 39.0–75.0)
NEUTROS ABS: 7.2 10*3/uL — AB (ref 1.5–6.5)
PLATELETS: 239 10*3/uL (ref 140–400)
RBC: 3.24 10*6/uL — ABNORMAL LOW (ref 4.20–5.82)
RDW: 19.2 % — ABNORMAL HIGH (ref 11.0–14.6)
WBC: 8.6 10*3/uL (ref 4.0–10.3)
lymph#: 0.6 10*3/uL — ABNORMAL LOW (ref 0.9–3.3)

## 2015-04-21 LAB — COMPREHENSIVE METABOLIC PANEL (CC13)
ALK PHOS: 71 U/L (ref 40–150)
ALT: 12 U/L (ref 0–55)
AST: 12 U/L (ref 5–34)
Albumin: 2.5 g/dL — ABNORMAL LOW (ref 3.5–5.0)
Anion Gap: 10 mEq/L (ref 3–11)
BUN: 16.7 mg/dL (ref 7.0–26.0)
CALCIUM: 9.6 mg/dL (ref 8.4–10.4)
CHLORIDE: 104 meq/L (ref 98–109)
CO2: 24 mEq/L (ref 22–29)
Creatinine: 0.8 mg/dL (ref 0.7–1.3)
EGFR: 88 mL/min/{1.73_m2} — AB (ref >90)
Glucose: 108 mg/dl (ref 70–140)
POTASSIUM: 3.3 meq/L — AB (ref 3.5–5.1)
SODIUM: 138 meq/L (ref 136–145)
Total Bilirubin: 0.99 mg/dL (ref 0.20–1.20)
Total Protein: 6.2 g/dL — ABNORMAL LOW (ref 6.4–8.3)

## 2015-04-21 MED ORDER — PROCHLORPERAZINE MALEATE 10 MG PO TABS
10.0000 mg | ORAL_TABLET | Freq: Once | ORAL | Status: AC
  Administered 2015-04-21: 10 mg via ORAL

## 2015-04-21 MED ORDER — SODIUM CHLORIDE 0.9 % IV SOLN
Freq: Once | INTRAVENOUS | Status: AC
  Administered 2015-04-21: 12:00:00 via INTRAVENOUS

## 2015-04-21 MED ORDER — PROCHLORPERAZINE MALEATE 10 MG PO TABS
ORAL_TABLET | ORAL | Status: AC
  Filled 2015-04-21: qty 1

## 2015-04-21 MED ORDER — SODIUM CHLORIDE 0.9 % IV SOLN
200.0000 mg | Freq: Once | INTRAVENOUS | Status: AC
  Administered 2015-04-21: 200 mg via INTRAVENOUS
  Filled 2015-04-21: qty 8

## 2015-04-21 NOTE — Patient Instructions (Signed)
Today , you received a drug called [place].

## 2015-04-21 NOTE — Progress Notes (Signed)
[name] [phone]   [phone]  OFFICE PROGRESS NOTE  [name],[name] [name], MD [place] [address]  DIAGNOSIS: Non-small cell carcinoma of lung, stage 4  Primary site: Lung (Right)  Staging method: AJCC 7th Edition  Clinical: Stage IV (T1a, N3, M1b) signed by Curt Bears, MD on 02/01/2014 1:42 PM  Summary: Stage IV (T1a, N3, M1b) Prostate cancer  Primary site: Prostate  Clinical: Stage I (T1c, NX, M0) signed by Wyatt Portela, MD on 08/06/2013 1:59 PM  Summary: Stage I (T1c, NX, M0)  PRIOR THERAPY:  1) Systemic chemotherapy with carboplatin for an AUC of 5 and Alimta 500 mg per meter squared given every 3 weeks. Status post 6 cycles, last dose was given 05/20/2014 discontinued secondary to disease progression. 2) palliative radiotherapy to the right lung and lumbar spines under the care of Dr. Sondra Come. 3) status post craniotomy with tumor excision under the care of Dr. Vertell Limber on 12/04/2014.  CURRENT THERAPY:  1) Immunotherapy with Ketruda (pembrolizumab) 2 MG/KG every 3 weeks. First dose 07/29/2014. He has positive PDL 1 expression (90%). Status post 6 cycles and his treatment is currently on hold secondary to the recent metastatic brain lesion and surgery. He will resume cycle #8 today. 2) Xarelto 20 mg by mouth daily for deep venous thrombosis and pulmonary embolism.  INTERVAL HISTORY: Nicholas Schmitt 79 y.o. male returns to the clinic today for follow-up visit accompanied by his wife. He is feeling fine today with no specific complaints except for fatigue and severely low back pain with radiation to the right leg which is not new. He continues to feel cold all the time secondary to the anemia. He is also fatigued, having had fallen several times due to lack of energy. He denies any loss of  Conscioussness, seizures or headaches. He is currently on Xarelto for history of the venous thrombosis and pulmonary  embolism. He had a IVC  filter placement on 22/97 without complications. He denied having any significant fever or chills, no nausea or vomiting. The patient denied having any significant chest pain, shortness of breath except with exertion, cough or hemoptysis. He is tolerating his current treatment with immunotherapy fairly well with no significant adverse effects.   MEDICAL HISTORY: Past Medical History  Diagnosis Date  . CAD (coronary artery disease)     positive stress test in 2006 led to left heart cath (8/06) showing 95% prox RCA, 90% CFX, and 90% mLAD.  patient had a cypher DES to all 3 lesions  . Hyperlipidemia   . Hearing loss   . AAA (abdominal aortic aneurysm) ([place]) 11/09    3.2 cm   . Chronic renal insufficiency   . History of radiation therapy 03/08/04- 04/08/04    prostate 4600 cGy in 3 fractions, radioactive seed implant 05/04/04  . Myocardial infarction ([place]) 1995  . Pacemaker 2014  . Shortness of breath     exertion  . COPD (chronic obstructive pulmonary disease) ([place])   . Radiation Jan.11-Jan 29/2016    Right central chest 35 gray in 14 fractions  . Radiation Jan.2016    35 gray in 14 fx lower lumbar/upper sacrum  . Diverticulosis   . Hemorrhoids   . Pneumonia     hx  . GERD (gastroesophageal reflux disease)   . Hypertension     no med now for 3-4 months  . Prostate cancer ([place]) 2005    gleason 7  . Skin  cancer     ear  . Lung cancer ([place])   . Cancer [facility]) 2015    prostate   . Cancer [facility]) December 04, 2014    Cranial   . Cancer [facility]) 04/01/2015    spine    ALLERGIES:  is allergic to oxycodone hcl.  MEDICATIONS:  Current Outpatient Prescriptions  Medication Sig Dispense Refill  . albuterol (PROVENTIL,VENTOLIN) 90 MCG/ACT inhaler Inhale 2 puffs into the lungs every 6 (six) hours as needed for wheezing or shortness of breath.    . ALPRAZolam (XANAX) 0.25 MG tablet Take 0.25 mg by mouth 2 (two) times daily as needed for anxiety.    . budesonide-formoterol  (SYMBICORT) 160-4.5 MCG/ACT inhaler Inhale 2 puffs into the lungs 2 (two) times daily.    [name] dexamethasone (DECADRON) 4 MG tablet Take 1 tablet (4 mg total) by mouth 2 (two) times daily. For 5 days total. (Patient not taking: Reported on 04/21/2015) 10 tablet 0  . famotidine (PEPCID) 20 MG tablet Take 20 mg by mouth at bedtime.    . fluticasone (FLONASE) 50 MCG/ACT nasal spray USE 2 SPRAYS IN EACH NOSTRIL AT NIGHT  5  . furosemide (LASIX) 20 MG tablet TAKE 1 TABLET (20 MG TOTAL) BY MOUTH DAILY. AS NEEDED FOR SWELLING. 30 tablet 0  . gabapentin (NEURONTIN) 100 MG capsule TAKE 1 CAPS 3 TIMES DAY X5DAY, 2 CAPS 3 TIMES DAY X5DAYS, 3 CAPS 3 TIMES DAY TIL DIRECTED OTHERWISE  0  . mirtazapine (REMERON) 30 MG tablet Take 1 tablet (30 mg total) by mouth at bedtime. 30 tablet 2  . Multiple Minerals (CALCIUM-MAGNESIUM-ZINC) TABS Take 1 tablet by mouth daily.    . Multiple Vitamin (MULTIVITAMIN WITH MINERALS) TABS tablet Take 1 tablet by mouth daily.    . naproxen sodium (ANAPROX) 220 MG tablet Take 440 mg by mouth 2 (two) times daily as needed (pain).    . nitroGLYCERIN (NITROSTAT) 0.4 MG SL tablet Place 0.4 mg under the tongue every 5 (five) minutes as needed for chest pain (MAX 3 TABLETS).    [name] omeprazole (PRILOSEC) 40 MG capsule Take 40 mg by mouth daily before supper.   0  . oxyCODONE (OXY IR/ROXICODONE) 5 MG immediate release tablet Take 1 tablet (5 mg total) by mouth every 6 (six) hours as needed for severe pain. 30 tablet 0  . prochlorperazine (COMPAZINE) 10 MG tablet Take 10 mg by mouth every 6 (six) hours as needed for nausea or vomiting.    . rivaroxaban (XARELTO) 20 MG TABS tablet Take 1 tablet (20 mg total) by mouth daily with supper. 30 tablet 2  . Sennosides (EX-LAX) 15 MG CHEW Chew 15 mg by mouth at bedtime.    . tamsulosin (FLOMAX) 0.4 MG CAPS capsule Take 0.4 mg by mouth at bedtime.   2   No current facility-administered medications for this visit.   Facility-Administered Medications Ordered  in Other Visits  Medication Dose Route Frequency Provider Last Rate Last Dose  . heparin lock flush 100 unit/mL  500 Units Intracatheter Daily PRN Adrena E Johnson, PA-C      . pembrolizumab (KEYTRUDA) 200 mg in sodium chloride 0.9 % 50 mL chemo infusion  200 mg Intravenous Once Curt Bears, MD      . prochlorperazine (COMPAZINE) tablet 10 mg  10 mg Oral Once Curt Bears, MD      . sodium chloride 0.9 % injection 10 mL  10 mL Intracatheter PRN Carlton Adam, PA-C      .  sodium chloride 0.9 % injection 3 mL  3 mL Intracatheter PRN Carlton Adam, PA-C        SURGICAL HISTORY:  Past Surgical History  Procedure Laterality Date  . Ptca  2006  . Coronary stent placement    . Permanent pacemaker insertion Bilateral 02/26/13    MDT Adapta L implanted by Dr Rayann Heman for sick sinus syndrome  . Radioactive seed implant  05/04/2004    8000 cGy, Dr Danny Lawless Dr Reece Agar  . Elbow surgery Bilateral 1973  . Coronary angioplasty    . Mediastinoscopy N/A 01/28/2014    Procedure: MEDIASTINOSCOPY;  Surgeon: Melrose Nakayama, MD;  Location: [facility];  Service: Thoracic;  Laterality: N/A;  . Permanent pacemaker insertion N/A 02/26/2013    Procedure: PERMANENT PACEMAKER INSERTION;  Surgeon: [name], MD;  Location: [place] CATH LAB;  Service: Cardiovascular;  Laterality: N/A;  . Insert / replace / remove pacemaker      not removed  . Craniotomy N/A 12/04/2014    Procedure: CRANIOTOMY TUMOR EXCISION, LEFT;  Surgeon: [name], MD;  Location: [facility] NEURO ORS;  Service: Neurosurgery;  Laterality: N/A;  CRANIOTOMY TUMOR EXCISION  . Application of cranial navigation  12/04/2014    Procedure: APPLICATION OF CRANIAL NAVIGATION;  Surgeon: [name], MD;  Location: [facility] NEURO ORS;  Service: Neurosurgery;;    REVIEW OF SYSTEMS:  Constitutional: positive for fatigue Eyes: negative Ears, nose, mouth, throat, and face: negative Respiratory: negative Cardiovascular: negative Gastrointestinal:  negative Genitourinary:negative Integument/breast: negative Hematologic/lymphatic: negative Musculoskeletal:positive for back pain Neurological: negative Behavioral/Psych: negative Endocrine: negative Allergic/Immunologic: negative   PHYSICAL EXAMINATION: General appearance: alert, cooperative, fatigued and no distress Head: Normocephalic, without obvious abnormality, atraumatic Neck: no adenopathy, no carotid bruit, supple, symmetrical, trachea midline and thyroid not enlarged, symmetric, no tenderness/mass/nodules Lymph nodes: Cervical, supraclavicular, and axillary nodes normal. Resp: clear to auscultation bilaterally Back: symmetric, no curvature. ROM normal. No CVA tenderness. Cardio: regular rate and rhythm, S1, S2 normal, no murmur, click, rub or gallop GI: soft, non-tender; bowel sounds normal; no masses,  no organomegaly Extremities: extremities normal, atraumatic, no cyanosis or edema Neurologic: Alert and oriented X 3, normal strength and tone. Normal symmetric reflexes. Normal coordination and gait  ECOG PERFORMANCE STATUS: 2 - Symptomatic, <50% confined to bed  Blood pressure 99/56, pulse 78, temperature 98.3 F (36.8 C), temperature source Oral, resp. rate 18, height '6\' 1"'$  (1.854 m), weight 163 lb 8 oz (74.163 kg), SpO2 95 %.  LABORATORY DATA: CBC Latest Ref Rng 04/21/2015 04/08/2015 03/31/2015  WBC 4.0 - 10.3 10e3/uL 8.6 6.9 5.7  Hemoglobin 13.0 - 17.1 g/dL 8.5(L) 8.4(L) 8.9(L)  Hematocrit 38.4 - 49.9 % 27.5(L) 27.9(L) 27.9(L)  Platelets 140 - 400 10e3/uL 239 238 229    CMP Latest Ref Rng 04/21/2015 03/31/2015 03/10/2015  Glucose 70 - 140 mg/dl 108 107 92  BUN 7.0 - 26.0 mg/dL 16.7 10.9 8.9  Creatinine 0.7 - 1.3 mg/dL 0.8 0.7 0.7  Sodium 136 - 145 mEq/L 138 141 142  Potassium 3.5 - 5.1 mEq/L 3.3(L) 3.8 3.9  Chloride 101 - 111 mmol/L - - -  CO2 22 - 29 mEq/L '24 27 27  '$ Calcium 8.4 - 10.4 mg/dL 9.6 10.3 9.7  Total Protein 6.4 - 8.3 g/dL 6.2(L) 6.7 6.3(L)  Total  Bilirubin 0.20 - 1.20 mg/dL 0.99 0.71 0.53  Alkaline Phos 40 - 150 U/L 71 66 72  AST 5 - 34 U/L '12 10 10  '$ ALT 0 - 55 U/L 12 <9 9  RADIOGRAPHIC STUDIES:   04/08/2015  CLINICAL DATA:  History of DVT and recurrent pulmonary embolism. History of metastatic lung carcinoma. The patient requires an IVC filter. EXAM: 1. ULTRASOUND GUIDANCE FOR VASCULAR ACCESS OF THE RIGHT COMMON FEMORAL VEIN. 2. IVC VENOGRAM. 3. PERCUTANEOUS IVC FILTER PLACEMENT. ANESTHESIA/SEDATION: 0.5 mg IV Versed; 25 mcg IV Fentanyl. Total Moderate Sedation Time 7 minutes. CONTRAST:  30 mL Omnipaque 300 FLUOROSCOPY TIME:  42 seconds. PROCEDURE: The procedure, risks, benefits, and alternatives were explained to the patient. Questions regarding the procedure were encouraged and answered. The patient understands and consents to the procedure. A time-out was performed prior to the procedure. The right groin was prepped with chlorhexidine in a sterile fashion, and a sterile drape was applied covering the operative field. A sterile gown and sterile gloves were used for the procedure. Local anesthesia was provided with 1% Lidocaine. Ultrasound was used to confirm patency of the right common femoral vein. Under direct ultrasound guidance, a 21 gauge needle was advanced into the right common femoral vein with ultrasound image documentation performed. After securing access with a micropuncture dilator, a guidewire was advanced into the inferior vena cava. A deployment sheath was advanced over the guidewire. This was utilized to perform IVC venography. The deployment sheath was further positioned in an appropriate location for filter deployment. A Bard Denali IVC filter was then advanced in the sheath. This was then fully deployed in the infrarenal IVC. Final filter position was confirmed with a fluoroscopic spot image. Contrast injection was also performed through the sheath under fluoroscopy to confirm patency of the IVC at the level of the filter.  After the procedure the sheath was removed and hemostasis obtained with manual compression. COMPLICATIONS: None. FINDINGS: IVC venography demonstrates a normal caliber IVC with no evidence of thrombus. Renal veins are identified bilaterally. The IVC filter was successfully positioned below the level of the renal veins and is appropriately oriented. This IVC filter has both permanent and retrievable indications. IMPRESSION: Placement of percutaneous IVC filter in infrarenal IVC. IVC venogram shows no evidence of IVC thrombus and normal caliber of the inferior vena cava. This filter does have both permanent and retrievable indications. Given the patient's history of metastatic malignancy and need for additional treatment, it is unlikely that this filter will be retrieved in the future. Electronically Signed   By: Aletta Edouard M.D.   On: 04/08/2015 16:22    ASSESSMENT AND PLAN: this is a very pleasant 79 years old white male with stage IV non-small cell lung cancer, adenocarcinoma with positive PDL 1 expression, completed systemic chemotherapy with carboplatin and Alimta status post 6 cycles and tolerated his treatment fairly well.  He is currently on treatment with immunotherapy with Ketruda (pembrolizumab) status post 11 cycles with stable disease in the chest, abdomen and pelvis except for the progressive bone metastasis at L5 vertebral body and new acute pulmonary embolism. Case discussed with Dr. Julien Nordmann during shared visit. Recommended for him to proceed with cycle #12 today as a scheduled. For the progressive bone lesion at [facility] is to see to Dr. Sondra Come for consideration of palliative radiotherapy to this area.  The patient would come back for follow-up visit in 3 weeks for reevaluation before the next cycle of his treatment. This will be followed by staging CTs to monitor response to treatment. He was advised to call if he has any concerning symptoms in the interval. The patient was advised to  call immediately if he has any concerning symptoms in the interval.  The patient voices understanding of current disease status and treatment options and is in agreement with the current care plan.  All questions were answered. The patient knows to call the clinic with any problems, questions or concerns. We can certainly see the patient much sooner if necessary.  ADDENDUM: Hematology/Oncology Attending: I had a face to face encounter with the patient. I recommended her care plan. This is a very pleasant 79 years old white male with a stage IV non-small cell lung cancer, adenocarcinoma currently undergoing treatment with immunotherapy with it would status post 11 cycles. The patient is feeling fine with no specific complaints except for generalized weakness and fatigue after his treatment. I recommended for the patient to continue his treatment with cycle #12 today as a scheduled. He would come back for follow-up visit in 3 weeks for reevaluation before starting cycle #13. He was advised to call immediately if he has any concerning symptoms in the interval.  Disclaimer: This note was dictated with voice recognition software. Similar sounding words can inadvertently be transcribed and may be missed upon review. Eilleen Kempf., MD 04/21/2015

## 2015-04-22 ENCOUNTER — Encounter: Payer: Self-pay | Admitting: Radiation Oncology

## 2015-04-22 ENCOUNTER — Encounter: Payer: Medicare Other | Admitting: Occupational Therapy

## 2015-04-22 ENCOUNTER — Ambulatory Visit
Admission: RE | Admit: 2015-04-22 | Discharge: 2015-04-22 | Disposition: A | Discharge status: Home / Self Care | Payer: Medicare Other | Source: Ambulatory Visit | Attending: Radiation Oncology | Admitting: Radiation Oncology

## 2015-04-22 DIAGNOSIS — Z510 Encounter for antineoplastic radiation therapy: Secondary | ICD-10-CM | POA: Diagnosis not present

## 2015-04-22 NOTE — Progress Notes (Signed)
S/p  SRS L-Spine treatment, pain stated "Alittle bit hip area pointing to right hip", offered water and warm blanket , patient given instructions  To call if any unusual side effects occur increased pain, fever ,nausea Verbal understanding,teach back given 12:38 PM

## 2015-04-23 ENCOUNTER — Ambulatory Visit: Payer: Medicare Other | Admitting: Radiation Oncology

## 2015-04-24 ENCOUNTER — Ambulatory Visit
Admission: RE | Admit: 2015-04-24 | Discharge: 2015-04-24 | Disposition: A | Discharge status: Home / Self Care | Payer: Medicare Other | Source: Ambulatory Visit | Attending: Radiation Oncology | Admitting: Radiation Oncology

## 2015-04-24 ENCOUNTER — Encounter: Payer: Self-pay | Admitting: Radiation Oncology

## 2015-04-24 VITALS — BP 114/66 | HR 94 | Temp 98.5°F | Resp 16

## 2015-04-24 DIAGNOSIS — Z510 Encounter for antineoplastic radiation therapy: Secondary | ICD-10-CM | POA: Diagnosis not present

## 2015-04-24 DIAGNOSIS — C7949 Secondary malignant neoplasm of other parts of nervous system: Secondary | ICD-10-CM

## 2015-04-24 NOTE — Progress Notes (Signed)
  Radiation Oncology         [phone] ________________________________  Spinal Stereotactic Radiosurgery Procedure Note  Name: Nicholas Schmitt MRN: [id number]  Date: 04/24/2015  DOB: Oct 05, 1935  SPECIAL TREATMENT PROCEDURE    ICD-9-CM ICD-10-CM   1. 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  It showed 3-dimensional radiation distributions overlaid onto the planning CT/MRI image set.  The [facility] for the target structures as well as the organs at risk were reviewed. The documentation of the 3D plan and dosimetry are filed in the radiation oncology EMR.  NARRATIVE:  CEYLON ARENSON was brought to the TrueBeam stereotactic radiation treatment machine and placed supine on the CT couch. The patient was precisely re-positioned in their BodyFix immobilization device, and the patient was set up for stereotactic radiosurgery.  Neurosurgery was present for the set-up and delivery  SIMULATION VERIFICATION:  In the couch zero-angle position, the patient underwent Exactrac imaging using the Brainlab system with orthogonal KV images to position the target accounting for translation and rotational factors.  These were carefully aligned and repeated to confirm treatment position.  Then, cone beam CT was performed to help further verify placement and make any final translational shifts.  SPECIAL TREATMENT PROCEDURE: [name] received stereotactic radiosurgery to the following targets:  The targeted metastasis in the L5 vertebral body was treated using 2 Rapid Arc VMAT Beams to a prescription dose of 9 Gy in 1 of 4 fractions to a total dose of 45 Gy. The beams were delivered with 10 MV X-rays in the flattening filter free mode.  STEREOTACTIC TREATMENT MANAGEMENT:  Following  delivery, the patient was transported to nursing in stable condition and monitored for possible acute effects.  Vital signs were recorded BP 114/66 mmHg  Pulse 94  Temp(Src) 98.5 F (36.9 C) (Oral)  Resp 16  SpO2 100%. The patient tolerated treatment without significant acute effects, and was discharged to home in stable condition.    PLAN: Patient will follow up in one month.  ________________________________  Sheral Apley. Tammi Klippel, M.D.  This document serves as a record of services personally performed by [name], MD. It was created on his behalf by Darcus Austin, a trained medical scribe. The creation of this record is based on the scribe's personal observations and the provider's statements to them. This document has been checked and approved by the attending provider.

## 2015-04-24 NOTE — Progress Notes (Signed)
Accompanied by wife and son. Vital stable. Confirms "a little bit of pain in right hip." A&O x 3. Riding in wheelchair. Reports he completed steroid taper on Saturday. Denies headache, dizziness, nausea, or vomiting. One month follow up appointment card given. Wife understands to contact staff with needs. Ladene Artist and Sam, RN business cards given.

## 2015-04-27 ENCOUNTER — Ambulatory Visit: Payer: Medicare Other | Admitting: Radiation Oncology

## 2015-04-28 ENCOUNTER — Other Ambulatory Visit: Payer: Medicare Other

## 2015-04-28 ENCOUNTER — Telehealth: Payer: Self-pay | Admitting: *Deleted

## 2015-04-28 ENCOUNTER — Encounter: Payer: Self-pay | Admitting: Occupational Therapy

## 2015-04-28 ENCOUNTER — Telehealth: Payer: Self-pay | Admitting: Radiation Oncology

## 2015-04-28 ENCOUNTER — Ambulatory Visit: Payer: Medicare Other | Admitting: Internal Medicine

## 2015-04-28 ENCOUNTER — Ambulatory Visit: Payer: Medicare Other | Attending: Internal Medicine | Admitting: Occupational Therapy

## 2015-04-28 DIAGNOSIS — M6289 Other specified disorders of muscle: Secondary | ICD-10-CM | POA: Insufficient documentation

## 2015-04-28 DIAGNOSIS — R29898 Other symptoms and signs involving the musculoskeletal system: Secondary | ICD-10-CM

## 2015-04-28 DIAGNOSIS — R279 Unspecified lack of coordination: Secondary | ICD-10-CM

## 2015-04-28 DIAGNOSIS — R6889 Other general symptoms and signs: Secondary | ICD-10-CM

## 2015-04-28 NOTE — Therapy (Signed)
[facility] [address] [place], [place], [address] Phone: [phone]   Fax:  [phone]  Occupational Therapy Treatment  Patient Details  Name: Nicholas Schmitt MRN: [id number] Date of Birth: 08/14/1935 No Data Recorded  Encounter Date: 04/28/2015      OT End of Session - 04/28/15 1500    Visit Number 1   Number of Visits 9   Date for OT Re-Evaluation 06/26/15   Authorization Type Blue MCR - G code   Authorization Time Period renewal completed 04/28/15, WEEK 1/8   Authorization - Visit Number 1   Authorization - Number of Visits 10   OT Start Time 1315   OT Stop Time 1400   OT Time Calculation (min) 45 min   Activity Tolerance Patient tolerated treatment well      Past Medical History  Diagnosis Date  . CAD (coronary artery disease)     positive stress test in 2006 led to left heart cath (8/06) showing 95% prox RCA, 90% CFX, and 90% mLAD.  patient had a cypher DES to all 3 lesions  . Hyperlipidemia   . Hearing loss   . AAA (abdominal aortic aneurysm) ([place]) 11/09    3.2 cm   . Chronic renal insufficiency   . History of radiation therapy 03/08/04- 04/08/04    prostate 4600 cGy in 3 fractions, radioactive seed implant 05/04/04  . Myocardial infarction ([place]) 1995  . Pacemaker 2014  . Shortness of breath     exertion  . COPD (chronic obstructive pulmonary disease) ([place])   . Radiation Jan.11-Jan 29/2016    Right central chest 35 gray in 14 fractions  . Radiation Jan.2016    35 gray in 14 fx lower lumbar/upper sacrum  . Diverticulosis   . Hemorrhoids   . Pneumonia     hx  . GERD (gastroesophageal reflux disease)   . Hypertension     no med now for 3-4 months  . Prostate cancer ([place]) 2005    gleason 7  . Skin cancer     ear  . Lung cancer ([place])   . Cancer [facility]) 2015    prostate   . Cancer [facility]) December 04, 2014    Cranial   . Cancer [facility]) 04/01/2015    spine    Past Surgical History   Procedure Laterality Date  . Ptca  2006  . Coronary stent placement    . Permanent pacemaker insertion Bilateral 02/26/13    MDT Adapta L implanted by Dr Rayann Heman for sick sinus syndrome  . Radioactive seed implant  05/04/2004    8000 cGy, Dr Danny Lawless Dr Reece Agar  . Elbow surgery Bilateral 1973  . Coronary angioplasty    . Mediastinoscopy N/A 01/28/2014    Procedure: MEDIASTINOSCOPY;  Surgeon: Melrose Nakayama, MD;  Location: [facility];  Service: Thoracic;  Laterality: N/A;  . Permanent pacemaker insertion N/A 02/26/2013    Procedure: PERMANENT PACEMAKER INSERTION;  Surgeon: [name], MD;  Location: [place] CATH LAB;  Service: Cardiovascular;  Laterality: N/A;  . Insert / replace / remove pacemaker      not removed  . Craniotomy N/A 12/04/2014    Procedure: CRANIOTOMY TUMOR EXCISION, LEFT;  Surgeon: [name], MD;  Location: [facility] NEURO ORS;  Service: Neurosurgery;  Laterality: N/A;  CRANIOTOMY TUMOR EXCISION  . Application of cranial navigation  12/04/2014    Procedure: APPLICATION OF CRANIAL NAVIGATION;  Surgeon: [name], MD;  Location: [facility] NEURO ORS;  Service: Neurosurgery;;  There were no vitals filed for this visit.  Visit Diagnosis:  Decreased functional activity tolerance - Plan: Ot plan of care cert/re-cert  Lack of coordination - Plan: Ot plan of care cert/re-cert  Weakness of right hand - Plan: Ot plan of care cert/re-cert  Weakness of right arm - Plan: Ot plan of care cert/re-cert      Subjective Assessment - 04/28/15 1353    Subjective  Pt returns today since last visit on 03/19/15 due to medical changes, but was not hospitalized. Pt had filter placed in lungs d/t PE, as well as more radiation to Rt lower back    Patient is accompained by: Family member  fiancee   Limitations DVT RLE (on active treatment), RUE clots (on active treatment), *active CA with chemo treatments   Patient Stated Goals get my dominant hand working good   Currently in Pain? Yes   Pain  Score 4    Pain Location Back   Pain Orientation Right   Pain Descriptors / Indicators Aching   Pain Type Chronic pain   Pain Onset More than a month ago   Pain Frequency Constant   Aggravating Factors  standing too long   Pain Relieving Factors medicine            [facility] OT Assessment - 04/28/15 0001    ADL   ADL comments Pt/fiancee reports pt still performing all BADLS at mod I level with distant supervision when bathing. However, pt struggles with buttons and shoes and takes him a long time to complete   Coordination   Right 9 Hole Peg Test 66.22 sec   Hand Function   Right Hand Grip (lbs) 40 lbs                  OT Treatments/Exercises (OP) - 04/28/15 0001    ADLs   Writing Practiced writing name x 5 initially at 70%-75% legibility, after repetition up to 85% legibility   ADL Comments Re-evaluation completed today due to pt has been longer than a month since last seen - see assessment. Also, adjusted/revised all LTG's for renewal period   Fine Motor Coordination   Other Fine Motor Exercises Pt placing medium sized pegs in pegboard Rt hand x 6 rows with min difficulty and drops, and extra time.                   OT Short Term Goals - 04/28/15 1501    OT SHORT TERM GOAL #1   Title (No STG's secondary to pt wishing to be seen only 1x/wk - refer to LTG's)           OT Long Term Goals - 04/28/15 1501    OT LONG TERM GOAL #1   Title Independent w/ strengthening HEP for RUE - due 06/26/15   Time 8   Period Weeks   Status On-going   OT LONG TERM GOAL #2   Title Improve grip strength to 45 lbs Rt hand   Baseline eval = 30 lbs, renewal = 40 lbs   Time 8   Period Weeks   Status Revised   OT LONG TERM GOAL #3   Title Improve coordination as evidenced by performing 9 hole peg test in 60 sec. or less   Baseline eval = 102.85 sec., renewal = 66.22 sec   Time 8   Period Weeks   Status Revised   OT LONG TERM GOAL #4   Title Pt to write name at 90%  legibility or greater    Baseline eval = 75% name only, renewal between 75-85% legiblity   Time 8   Period Weeks   Status Revised   OT LONG TERM GOAL #5   Title Pt to perform 10 min. of physical activity without rest   Time 8   Period Weeks   Status New   Long Term Additional Goals   Additional Long Term Goals Yes   OT LONG TERM GOAL #6   Title Pt to hook buttons and don shoes with greater ease and use of A/E prn    Time 8   Period Weeks   Status New               Plan - 2015-05-03 1506    Clinical Impression Statement Pt returns today for re-evaluation following medical complications. Pt was last seen on 03/19/15. Pt has improved in coordination and grip strength since last visit. However, has had a decline in overall strength and endurance, and slightly decreased handwriting.    Pt will benefit from skilled therapeutic intervention in order to improve on the following deficits (Retired) Decreased coordination;Decreased range of motion;Decreased endurance;Impaired sensation;Decreased activity tolerance;Decreased knowledge of precautions;Impaired UE functional use;Decreased strength;Decreased cognition;Cardiopulmonary status limiting activity   Rehab Potential Good   OT Frequency 1x / week  However, will see 2x this week (week of renewal due to already scheduled)   OT Duration 8 weeks   OT Treatment/Interventions Self-care/ADL training;Therapeutic exercise;Patient/family education;Manual Therapy;Neuromuscular education;Energy conservation;DME and/or AE instruction;Therapeutic activities;Moist Heat;Cognitive remediation/compensation;Passive range of motion   Plan practice hooking buttons with and without button hook, strength/endurance   OT Home Exercise Plan education issued:  02/24/15 coordination/red putty HEP; added coordination ex on 02/25/15. Theraband HEP on 03/05/15   Consulted and Agree with Plan of Care Patient;Family member/caregiver   Family Member Consulted fiancee 2022/11/28)           G-Codes - 2015-05-03 1506    Functional Assessment Tool Used 9 hole peg test Rt = 66.22 sec., Grip strength Rt = 40 lbs   Functional Limitation Carrying, moving and handling objects   Carrying, Moving and Handling Objects Current Status [phone]) At least 20 percent but less than 40 percent impaired, limited or restricted   Carrying, Moving and Handling Objects Goal Status ([id number]) At least 1 percent but less than 20 percent impaired, limited or restricted      Problem List Patient Active Problem List   Diagnosis Date Noted  . Acute pulmonary embolism ([place])   . 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 Hyperbilirubinemia 01/17/2015  . DVT (deep venous thrombosis) ([place]) 01/17/2015  . Sensation of cold in leg 01/06/2015  . Pain of left leg 01/06/2015  . Metastasis to brain ([place]) 12/04/2014  . Brain metastasis ([place]) 11/17/2014  . Encounter for antineoplastic immunotherapy 11/17/2014  . Other pancytopenia ([place])   . Acute respiratory failure with hypoxia ([place])   . Antineoplastic chemotherapy induced pancytopenia ([place])   . SOB (shortness of breath) 07/21/2014  . CAP (community acquired pneumonia) 07/21/2014  . Neoplasm related pain 06/18/2014  . Anemia in neoplastic disease 06/04/2014  . Dyspnea 06/02/2014  . Acute right hip pain 05/26/2014  . Non-small cell carcinoma of lung, stage 4 ([place]) 01/30/2014  . Nodule of right lung 01/21/2014  . Mediastinal adenopathy 01/21/2014  . Prostate cancer ([place])   . History of radiation therapy   . 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 Sick sinus syndrome (  [place]) 04/16/2013  . Cardiac arrhythmia 07/12/2011  . COPD GOLD II 08/27/2010  . Abdominal aortic aneurysm ([place]) 05/14/2008  . ACTINIC KERATOSIS, HEAD 02/11/2008  . LOSS, CONDUCTIVE HEARING, BILATERAL 01/18/2007  . HLD (hyperlipidemia) 12/25/2006  . Essential hypertension 12/25/2006  . Coronary atherosclerosis 12/25/2006    Carey Bullocks, OTR/L 04/28/2015, 3:16 PM  [facility] [address] [place], [place], [address] Phone: [phone]   Fax:  [phone]  Name: Nicholas Schmitt MRN: [id number] Date of Birth: March 16, 1936

## 2015-04-28 NOTE — Telephone Encounter (Signed)
Phoned patient's significant other, June. She confirms the patient has been taking Neurontin as directed without fail. Explained that possibly decadron/steroids could help with the low back pain. June reports chemo is scheduled for two weeks from today and wonders if he will be tapered off in time. Explained the initial thought was to continue steroids until follow up with [facility] Questioned if he is taking the oxycodone for pain. June states, "he doesn't take it unless he has too because it causes him to be constipated." Before moving forward with decadron this RN will speak with Dr. Tammi Klippel again and phone back with further directions.

## 2015-04-28 NOTE — Telephone Encounter (Signed)
Call from Crayne at [facility] asking if MD will be attending for pt. Rviewed with MD who did not put pt on Hospice. Pt currently on Chemotherapy q21days. Last tx 04/21/15. Amy advised pt was referred to hospice by his daughter in law who states he had not been taking chemo.  If pt is requesting hospice, this should be discussed with MD. No further concerns.

## 2015-04-28 NOTE — Telephone Encounter (Signed)
Understand from [place], RT that the patient has been experiencing increased lower back pain. Phone patient's home. He explains his low back pain has progressively gotten worse since Sunday. Reports Goody powder provided some relief on Sunday but, none since then.  He reports his leg pain has resolved and he is walking better. Reports he hasn't had a bowel movement in four days. Denies numbness, tingling or swelling of lower extremities. Patient understand this RN will call back with further directions once she speaks with Dr. Tammi Klippel.

## 2015-04-30 ENCOUNTER — Encounter: Payer: Self-pay | Admitting: Occupational Therapy

## 2015-04-30 ENCOUNTER — Ambulatory Visit: Payer: Medicare Other | Admitting: Occupational Therapy

## 2015-04-30 DIAGNOSIS — R6889 Other general symptoms and signs: Secondary | ICD-10-CM | POA: Diagnosis not present

## 2015-04-30 DIAGNOSIS — R279 Unspecified lack of coordination: Secondary | ICD-10-CM

## 2015-04-30 NOTE — Therapy (Signed)
[facility] [address] [place], [place], [address] Phone: [phone]   Fax:  [phone]  Occupational Therapy Treatment  Patient Details  Name: Nicholas Schmitt MRN: [id number] Date of Birth: Mar 07, 1936 No Data Recorded  Encounter Date: 04/30/2015      OT End of Session - 04/30/15 1355    Visit Number 2   Number of Visits 9   Date for OT Re-Evaluation 06/26/15   Authorization Type Blue MCR - G code   Authorization Time Period renewal completed 04/28/15, WEEK 1/8   Authorization - Visit Number 2   Authorization - Number of Visits 10   OT Start Time 1315   OT Stop Time 1357   OT Time Calculation (min) 42 min   Equipment Utilized During Treatment UBE   Activity Tolerance Patient tolerated treatment well      Past Medical History  Diagnosis Date  . CAD (coronary artery disease)     positive stress test in 2006 led to left heart cath (8/06) showing 95% prox RCA, 90% CFX, and 90% mLAD.  patient had a cypher DES to all 3 lesions  . Hyperlipidemia   . Hearing loss   . AAA (abdominal aortic aneurysm) ([place]) 11/09    3.2 cm   . Chronic renal insufficiency   . History of radiation therapy 03/08/04- 04/08/04    prostate 4600 cGy in 3 fractions, radioactive seed implant 05/04/04  . Myocardial infarction ([place]) 1995  . Pacemaker 2014  . Shortness of breath     exertion  . COPD (chronic obstructive pulmonary disease) ([place])   . Radiation Jan.11-Jan 29/2016    Right central chest 35 gray in 14 fractions  . Radiation Jan.2016    35 gray in 14 fx lower lumbar/upper sacrum  . Diverticulosis   . Hemorrhoids   . Pneumonia     hx  . GERD (gastroesophageal reflux disease)   . Hypertension     no med now for 3-4 months  . Prostate cancer ([place]) 2005    gleason 7  . Skin cancer     ear  . Lung cancer ([place])   . Cancer [facility]) 2015    prostate   . Cancer [facility]) December 04, 2014    Cranial   . Cancer [facility]) 04/01/2015   spine    Past Surgical History  Procedure Laterality Date  . Ptca  2006  . Coronary stent placement    . Permanent pacemaker insertion Bilateral 02/26/13    MDT Adapta L implanted by Dr Rayann Heman for sick sinus syndrome  . Radioactive seed implant  05/04/2004    8000 cGy, Dr Danny Lawless Dr Reece Agar  . Elbow surgery Bilateral 1973  . Coronary angioplasty    . Mediastinoscopy N/A 01/28/2014    Procedure: MEDIASTINOSCOPY;  Surgeon: Melrose Nakayama, MD;  Location: [facility];  Service: Thoracic;  Laterality: N/A;  . Permanent pacemaker insertion N/A 02/26/2013    Procedure: PERMANENT PACEMAKER INSERTION;  Surgeon: [name], MD;  Location: [facility] CATH LAB;  Service: Cardiovascular;  Laterality: N/A;  . Insert / replace / remove pacemaker      not removed  . Craniotomy N/A 12/04/2014    Procedure: CRANIOTOMY TUMOR EXCISION, LEFT;  Surgeon: [name], MD;  Location: [facility] NEURO ORS;  Service: Neurosurgery;  Laterality: N/A;  CRANIOTOMY TUMOR EXCISION  . Application of cranial navigation  12/04/2014    Procedure: APPLICATION OF CRANIAL NAVIGATION;  Surgeon: [name], MD;  Location: [facility]  ORS;  Service: Neurosurgery;;    There were no vitals filed for this visit.  Visit Diagnosis:  Decreased functional activity tolerance  Lack of coordination      Subjective Assessment - 04/30/15 1322    Limitations DVT RLE (on active treatment), RUE clots (on active treatment), *active CA with chemo treatments   Patient Stated Goals get my dominant hand working good   Currently in Pain? Yes   Pain Score 4    Pain Location Back   Pain Orientation Lower   Pain Descriptors / Indicators Aching   Pain Type Chronic pain   Pain Onset More than a month ago   Pain Frequency Constant   Aggravating Factors  standing too long   Pain Relieving Factors medicine                      OT Treatments/Exercises (OP) - 04/30/15 0001    ADLs   UB Dressing Pt practiced hooking buttons: unable  without A/E. However, pt could perform with button hook and min cues to use correctly   LB Dressing Pt practiced doffing and donning shoes and tying shoes I'ly   Writing Writing full name with built up pen x 5 with approx. 75% legibility and extra time   Shoulder Exercises: ROM/Strengthening   UBE (Upper Arm Bike) UBE x 8 min. Level 2 for strength/endurance with short rest breaks required                  OT Short Term Goals - 04/28/15 1501    OT SHORT TERM GOAL #1   Title (No STG's secondary to pt wishing to be seen only 1x/wk - refer to LTG's)           OT Long Term Goals - 04/30/15 1348    OT LONG TERM GOAL #1   Title Independent w/ strengthening HEP for RUE - due 06/26/15   Time 8   Period Weeks   Status On-going   OT LONG TERM GOAL #2   Title Improve grip strength to 45 lbs Rt hand   Baseline eval = 30 lbs, renewal = 40 lbs   Time 8   Period Weeks   Status Revised   OT LONG TERM GOAL #3   Title Improve coordination as evidenced by performing 9 hole peg test in 60 sec. or less   Baseline eval = 102.85 sec., renewal = 66.22 sec   Time 8   Period Weeks   Status Revised   OT LONG TERM GOAL #4   Title Pt to write name at 90% legibility or greater    Baseline eval = 75% name only, renewal between 75-85% legiblity   Time 8   Period Weeks   Status Revised   OT LONG TERM GOAL #5   Title Pt to perform 10 min. of physical activity without rest   Time 8   Period Weeks   Status New   OT LONG TERM GOAL #6   Title Pt to hook buttons and don shoes with greater ease and use of A/E prn    Time 8   Period Weeks   Status Achieved  with use of button hook               Plan - 04/30/15 1349    Clinical Impression Statement Pt met LTG #6. Pt with decreased endurance    Plan gripper activity, strength/endurance   OT Home Exercise Plan education issued:  02/24/15  coordination/red putty HEP; added coordination ex on 02/25/15. Theraband HEP on 03/05/15   Consulted  and Agree with Plan of Care Patient        Problem List Patient Active Problem List   Diagnosis Date Noted  . Acute pulmonary embolism ([place])   . 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 Hyperbilirubinemia 01/17/2015  . DVT (deep venous thrombosis) ([place]) 01/17/2015  . Sensation of cold in leg 01/06/2015  . Pain of left leg 01/06/2015  . Metastasis to brain ([place]) 12/04/2014  . Brain metastasis ([place]) 11/17/2014  . Encounter for antineoplastic immunotherapy 11/17/2014  . Other pancytopenia ([place])   . Acute respiratory failure with hypoxia ([place])   . Antineoplastic chemotherapy induced pancytopenia ([place])   . SOB (shortness of breath) 07/21/2014  . CAP (community acquired pneumonia) 07/21/2014  . Neoplasm related pain 06/18/2014  . Anemia in neoplastic disease 06/04/2014  . Dyspnea 06/02/2014  . Acute right hip pain 05/26/2014  . Non-small cell carcinoma of lung, stage 4 ([place]) 01/30/2014  . Nodule of right lung 01/21/2014  . Mediastinal adenopathy 01/21/2014  . Prostate cancer ([place])   . History of radiation therapy   . 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 Sick sinus syndrome ([place]) 04/16/2013  . Cardiac arrhythmia 07/12/2011  . COPD GOLD II 08/27/2010  . Abdominal aortic aneurysm ([place]) 05/14/2008  . ACTINIC KERATOSIS, HEAD 02/11/2008  . LOSS, CONDUCTIVE HEARING, BILATERAL 01/18/2007  . HLD (hyperlipidemia) 12/25/2006  . Essential hypertension 12/25/2006  . Coronary atherosclerosis 12/25/2006    Carey Bullocks, OTR/L 04/30/2015, 1:57 PM  [facility] [address] [place], [place], [address] Phone: [phone]   Fax:  [phone]  Name: Nicholas Schmitt MRN: [id number] Date of Birth: 04-Mar-1936

## 2015-05-01 ENCOUNTER — Telehealth: Payer: Self-pay | Admitting: *Deleted

## 2015-05-01 NOTE — Telephone Encounter (Addendum)
Called and spoke with Nicholas Schmitt spouse and relayed, per Dr. Tammi Klippel, that he needs to take his Oxycodone as prescribed on his script, 1 tab Q 6 hours as needed for pain.  She revealed that he is sporadically taking Oxycodone due to constipation with his pain regimen.  Advised to take his Senokot-S twice daily, since he has no relief with Miralax, while taking his medications to manage his constipation and to ensure that he stays hydrated, She expressed thanks.  Encouraged to call if she has questions or concerns.

## 2015-05-04 ENCOUNTER — Other Ambulatory Visit: Payer: Self-pay | Admitting: Radiation Oncology

## 2015-05-05 ENCOUNTER — Encounter (HOSPITAL_COMMUNITY): Payer: Self-pay | Admitting: Emergency Medicine

## 2015-05-05 ENCOUNTER — Emergency Department (HOSPITAL_COMMUNITY): Payer: Medicare Other

## 2015-05-05 ENCOUNTER — Inpatient Hospital Stay (HOSPITAL_COMMUNITY): Payer: Medicare Other

## 2015-05-05 ENCOUNTER — Inpatient Hospital Stay (HOSPITAL_COMMUNITY)
Admission: EM | Admit: 2015-05-05 | Discharge: 2015-05-09 | DRG: 180 | Disposition: A | Discharge status: Skilled Nursing Facility | Payer: Medicare Other | Attending: Internal Medicine | Admitting: Internal Medicine

## 2015-05-05 DIAGNOSIS — Z66 Do not resuscitate: Secondary | ICD-10-CM | POA: Diagnosis present

## 2015-05-05 DIAGNOSIS — C3490 Malignant neoplasm of unspecified part of unspecified bronchus or lung: Principal | ICD-10-CM | POA: Diagnosis present

## 2015-05-05 DIAGNOSIS — R222 Localized swelling, mass and lump, trunk: Secondary | ICD-10-CM | POA: Diagnosis not present

## 2015-05-05 DIAGNOSIS — Z8701 Personal history of pneumonia (recurrent): Secondary | ICD-10-CM

## 2015-05-05 DIAGNOSIS — R0602 Shortness of breath: Secondary | ICD-10-CM | POA: Diagnosis present

## 2015-05-05 DIAGNOSIS — D630 Anemia in neoplastic disease: Secondary | ICD-10-CM | POA: Diagnosis present

## 2015-05-05 DIAGNOSIS — C61 Malignant neoplasm of prostate: Secondary | ICD-10-CM | POA: Diagnosis present

## 2015-05-05 DIAGNOSIS — Z9221 Personal history of antineoplastic chemotherapy: Secondary | ICD-10-CM | POA: Diagnosis not present

## 2015-05-05 DIAGNOSIS — Z7952 Long term (current) use of systemic steroids: Secondary | ICD-10-CM | POA: Diagnosis not present

## 2015-05-05 DIAGNOSIS — C7931 Secondary malignant neoplasm of brain: Secondary | ICD-10-CM | POA: Diagnosis present

## 2015-05-05 DIAGNOSIS — Z87891 Personal history of nicotine dependence: Secondary | ICD-10-CM

## 2015-05-05 DIAGNOSIS — Z955 Presence of coronary angioplasty implant and graft: Secondary | ICD-10-CM

## 2015-05-05 DIAGNOSIS — J910 Malignant pleural effusion: Secondary | ICD-10-CM

## 2015-05-05 DIAGNOSIS — Z923 Personal history of irradiation: Secondary | ICD-10-CM

## 2015-05-05 DIAGNOSIS — Z7951 Long term (current) use of inhaled steroids: Secondary | ICD-10-CM

## 2015-05-05 DIAGNOSIS — C7951 Secondary malignant neoplasm of bone: Secondary | ICD-10-CM | POA: Diagnosis present

## 2015-05-05 DIAGNOSIS — Z9889 Other specified postprocedural states: Secondary | ICD-10-CM

## 2015-05-05 DIAGNOSIS — R0600 Dyspnea, unspecified: Secondary | ICD-10-CM | POA: Diagnosis present

## 2015-05-05 DIAGNOSIS — K219 Gastro-esophageal reflux disease without esophagitis: Secondary | ICD-10-CM | POA: Diagnosis present

## 2015-05-05 DIAGNOSIS — Z8249 Family history of ischemic heart disease and other diseases of the circulatory system: Secondary | ICD-10-CM

## 2015-05-05 DIAGNOSIS — I2510 Atherosclerotic heart disease of native coronary artery without angina pectoris: Secondary | ICD-10-CM | POA: Diagnosis present

## 2015-05-05 DIAGNOSIS — Z825 Family history of asthma and other chronic lower respiratory diseases: Secondary | ICD-10-CM | POA: Diagnosis not present

## 2015-05-05 DIAGNOSIS — I2699 Other pulmonary embolism without acute cor pulmonale: Secondary | ICD-10-CM | POA: Diagnosis present

## 2015-05-05 DIAGNOSIS — I5189 Other ill-defined heart diseases: Secondary | ICD-10-CM | POA: Diagnosis present

## 2015-05-05 DIAGNOSIS — N182 Chronic kidney disease, stage 2 (mild): Secondary | ICD-10-CM | POA: Diagnosis present

## 2015-05-05 DIAGNOSIS — Z86718 Personal history of other venous thrombosis and embolism: Secondary | ICD-10-CM

## 2015-05-05 DIAGNOSIS — Z95828 Presence of other vascular implants and grafts: Secondary | ICD-10-CM

## 2015-05-05 DIAGNOSIS — Z950 Presence of cardiac pacemaker: Secondary | ICD-10-CM | POA: Diagnosis not present

## 2015-05-05 DIAGNOSIS — Z5112 Encounter for antineoplastic immunotherapy: Secondary | ICD-10-CM

## 2015-05-05 DIAGNOSIS — I330 Acute and subacute infective endocarditis: Secondary | ICD-10-CM | POA: Diagnosis present

## 2015-05-05 DIAGNOSIS — I252 Old myocardial infarction: Secondary | ICD-10-CM

## 2015-05-05 DIAGNOSIS — I82409 Acute embolism and thrombosis of unspecified deep veins of unspecified lower extremity: Secondary | ICD-10-CM | POA: Diagnosis present

## 2015-05-05 DIAGNOSIS — Z79899 Other long term (current) drug therapy: Secondary | ICD-10-CM

## 2015-05-05 DIAGNOSIS — J948 Other specified pleural conditions: Secondary | ICD-10-CM | POA: Diagnosis not present

## 2015-05-05 DIAGNOSIS — I129 Hypertensive chronic kidney disease with stage 1 through stage 4 chronic kidney disease, or unspecified chronic kidney disease: Secondary | ICD-10-CM | POA: Diagnosis present

## 2015-05-05 DIAGNOSIS — J918 Pleural effusion in other conditions classified elsewhere: Secondary | ICD-10-CM | POA: Diagnosis not present

## 2015-05-05 DIAGNOSIS — G8929 Other chronic pain: Secondary | ICD-10-CM | POA: Diagnosis present

## 2015-05-05 DIAGNOSIS — Z7901 Long term (current) use of anticoagulants: Secondary | ICD-10-CM | POA: Diagnosis not present

## 2015-05-05 DIAGNOSIS — K5909 Other constipation: Secondary | ICD-10-CM

## 2015-05-05 DIAGNOSIS — J441 Chronic obstructive pulmonary disease with (acute) exacerbation: Secondary | ICD-10-CM | POA: Diagnosis present

## 2015-05-05 DIAGNOSIS — I714 Abdominal aortic aneurysm, without rupture, unspecified: Secondary | ICD-10-CM | POA: Diagnosis present

## 2015-05-05 DIAGNOSIS — Z86711 Personal history of pulmonary embolism: Secondary | ICD-10-CM | POA: Diagnosis not present

## 2015-05-05 DIAGNOSIS — M545 Low back pain: Secondary | ICD-10-CM | POA: Diagnosis present

## 2015-05-05 DIAGNOSIS — K5900 Constipation, unspecified: Secondary | ICD-10-CM | POA: Diagnosis present

## 2015-05-05 DIAGNOSIS — E785 Hyperlipidemia, unspecified: Secondary | ICD-10-CM | POA: Diagnosis present

## 2015-05-05 DIAGNOSIS — D899 Disorder involving the immune mechanism, unspecified: Secondary | ICD-10-CM | POA: Diagnosis present

## 2015-05-05 DIAGNOSIS — H9190 Unspecified hearing loss, unspecified ear: Secondary | ICD-10-CM | POA: Diagnosis present

## 2015-05-05 DIAGNOSIS — J449 Chronic obstructive pulmonary disease, unspecified: Secondary | ICD-10-CM | POA: Diagnosis not present

## 2015-05-05 DIAGNOSIS — J90 Pleural effusion, not elsewhere classified: Secondary | ICD-10-CM

## 2015-05-05 DIAGNOSIS — C3491 Malignant neoplasm of unspecified part of right bronchus or lung: Secondary | ICD-10-CM | POA: Diagnosis not present

## 2015-05-05 LAB — CBC WITH DIFFERENTIAL/PLATELET
BASOS PCT: 0 %
Basophils Absolute: 0 10*3/uL (ref 0.0–0.1)
EOS ABS: 0.3 10*3/uL (ref 0.0–0.7)
EOS PCT: 8 %
HCT: 28.5 % — ABNORMAL LOW (ref 39.0–52.0)
HEMOGLOBIN: 8.3 g/dL — AB (ref 13.0–17.0)
Lymphocytes Relative: 16 %
Lymphs Abs: 0.5 10*3/uL — ABNORMAL LOW (ref 0.7–4.0)
MCH: 25.5 pg — AB (ref 26.0–34.0)
MCHC: 29.1 g/dL — ABNORMAL LOW (ref 30.0–36.0)
MCV: 87.4 fL (ref 78.0–100.0)
Monocytes Absolute: 0.2 10*3/uL (ref 0.1–1.0)
Monocytes Relative: 8 %
NEUTROS PCT: 68 %
Neutro Abs: 2.1 10*3/uL (ref 1.7–7.7)
PLATELETS: 143 10*3/uL — AB (ref 150–400)
RBC: 3.26 MIL/uL — AB (ref 4.22–5.81)
RDW: 18.9 % — ABNORMAL HIGH (ref 11.5–15.5)
WBC: 3.1 10*3/uL — AB (ref 4.0–10.5)

## 2015-05-05 LAB — COMPREHENSIVE METABOLIC PANEL
ALBUMIN: 3 g/dL — AB (ref 3.5–5.0)
ALK PHOS: 81 U/L (ref 38–126)
ALT: 11 U/L — ABNORMAL LOW (ref 17–63)
ANION GAP: 9 (ref 5–15)
AST: 13 U/L — AB (ref 15–41)
BILIRUBIN TOTAL: 0.7 mg/dL (ref 0.3–1.2)
BUN: 13 mg/dL (ref 6–20)
CALCIUM: 9.2 mg/dL (ref 8.9–10.3)
CO2: 26 mmol/L (ref 22–32)
CREATININE: 0.64 mg/dL (ref 0.61–1.24)
Chloride: 106 mmol/L (ref 101–111)
Glucose, Bld: 95 mg/dL (ref 65–99)
Potassium: 3.8 mmol/L (ref 3.5–5.1)
Sodium: 141 mmol/L (ref 135–145)
TOTAL PROTEIN: 6.5 g/dL (ref 6.5–8.1)

## 2015-05-05 LAB — BODY FLUID CELL COUNT WITH DIFFERENTIAL
Lymphs, Fluid: 53 %
MONOCYTE-MACROPHAGE-SEROUS FLUID: 39 % — AB (ref 50–90)
Neutrophil Count, Fluid: 8 % (ref 0–25)
WBC FLUID: 762 uL (ref 0–1000)

## 2015-05-05 LAB — I-STAT TROPONIN, ED: TROPONIN I, POC: 0 ng/mL (ref 0.00–0.08)

## 2015-05-05 LAB — LACTATE DEHYDROGENASE, PLEURAL OR PERITONEAL FLUID: LD FL: 71 U/L — AB (ref 3–23)

## 2015-05-05 LAB — TSH: TSH: 2.591 u[IU]/mL (ref 0.350–4.500)

## 2015-05-05 LAB — TROPONIN I: TROPONIN I: 0.1 ng/mL — AB (ref <0.031)

## 2015-05-05 LAB — MAGNESIUM: MAGNESIUM: 1.7 mg/dL (ref 1.7–2.4)

## 2015-05-05 LAB — BRAIN NATRIURETIC PEPTIDE: B NATRIURETIC PEPTIDE 5: 74.3 pg/mL (ref 0.0–100.0)

## 2015-05-05 LAB — MRSA PCR SCREENING: MRSA by PCR: NEGATIVE

## 2015-05-05 MED ORDER — ACETAMINOPHEN 325 MG PO TABS: 650.0000 mg | ORAL_TABLET | Freq: Four times a day (QID) | ORAL | Status: DC | PRN

## 2015-05-05 MED ORDER — ACETAMINOPHEN 650 MG RE SUPP: 650.0000 mg | Freq: Four times a day (QID) | RECTAL | Status: DC | PRN

## 2015-05-05 MED ORDER — DEXTROSE 5 % IV SOLN
1.0000 g | INTRAVENOUS | Status: DC
  Administered 2015-05-05: 1 g via INTRAVENOUS
  Filled 2015-05-05: qty 10

## 2015-05-05 MED ORDER — HYDROMORPHONE HCL 1 MG/ML IJ SOLN: 0.5000 mg | INTRAMUSCULAR | Status: DC | PRN

## 2015-05-05 MED ORDER — LEVALBUTEROL HCL 1.25 MG/0.5ML IN NEBU
1.2500 mg | INHALATION_SOLUTION | RESPIRATORY_TRACT | Status: DC | PRN
  Filled 2015-05-05: qty 0.5

## 2015-05-05 MED ORDER — SODIUM CHLORIDE 0.9 % IJ SOLN: 3.0000 mL | INTRAMUSCULAR | Status: DC | PRN

## 2015-05-05 MED ORDER — RIVAROXABAN 20 MG PO TABS
20.0000 mg | ORAL_TABLET | Freq: Every day | ORAL | Status: DC
  Administered 2015-05-05 – 2015-05-08 (×4): 20 mg via ORAL
  Filled 2015-05-05 (×5): qty 1

## 2015-05-05 MED ORDER — SODIUM CHLORIDE 0.9 % IJ SOLN
3.0000 mL | Freq: Two times a day (BID) | INTRAMUSCULAR | Status: DC
  Administered 2015-05-05 – 2015-05-08 (×5): 3 mL via INTRAVENOUS

## 2015-05-05 MED ORDER — METHYLPREDNISOLONE SODIUM SUCC 40 MG IJ SOLR
40.0000 mg | Freq: Three times a day (TID) | INTRAMUSCULAR | Status: DC
  Administered 2015-05-05 – 2015-05-08 (×8): 40 mg via INTRAVENOUS
  Filled 2015-05-05 (×12): qty 1

## 2015-05-05 MED ORDER — PANTOPRAZOLE SODIUM 40 MG PO TBEC
40.0000 mg | DELAYED_RELEASE_TABLET | Freq: Every day | ORAL | Status: DC
  Administered 2015-05-05 – 2015-05-09 (×4): 40 mg via ORAL
  Filled 2015-05-05 (×4): qty 1

## 2015-05-05 MED ORDER — BUDESONIDE-FORMOTEROL FUMARATE 160-4.5 MCG/ACT IN AERO
2.0000 | INHALATION_SPRAY | Freq: Two times a day (BID) | RESPIRATORY_TRACT | Status: DC
  Administered 2015-05-05: 2 via RESPIRATORY_TRACT
  Filled 2015-05-05: qty 6

## 2015-05-05 MED ORDER — FUROSEMIDE 10 MG/ML IJ SOLN
40.0000 mg | Freq: Once | INTRAMUSCULAR | Status: AC
  Administered 2015-05-05: 40 mg via INTRAVENOUS
  Filled 2015-05-05: qty 4

## 2015-05-05 MED ORDER — SODIUM CHLORIDE 0.9 % IJ SOLN
3.0000 mL | Freq: Two times a day (BID) | INTRAMUSCULAR | Status: DC
  Administered 2015-05-05 – 2015-05-09 (×7): 3 mL via INTRAVENOUS

## 2015-05-05 MED ORDER — METHYLPREDNISOLONE SODIUM SUCC 125 MG IJ SOLR
80.0000 mg | Freq: Once | INTRAMUSCULAR | Status: AC
  Administered 2015-05-05: 80 mg via INTRAVENOUS
  Filled 2015-05-05: qty 2

## 2015-05-05 MED ORDER — ONDANSETRON HCL 4 MG PO TABS: 4.0000 mg | ORAL_TABLET | Freq: Four times a day (QID) | ORAL | Status: DC | PRN

## 2015-05-05 MED ORDER — SODIUM CHLORIDE 0.9 % IV SOLN: 250.0000 mL | INTRAVENOUS | Status: DC | PRN

## 2015-05-05 MED ORDER — DEXTROSE 5 % IV SOLN
500.0000 mg | INTRAVENOUS | Status: DC
  Administered 2015-05-05: 500 mg via INTRAVENOUS
  Filled 2015-05-05: qty 500

## 2015-05-05 MED ORDER — GABAPENTIN 300 MG PO CAPS
300.0000 mg | ORAL_CAPSULE | Freq: Three times a day (TID) | ORAL | Status: DC
  Administered 2015-05-05 – 2015-05-09 (×12): 300 mg via ORAL
  Filled 2015-05-05 (×13): qty 1

## 2015-05-05 MED ORDER — LEVALBUTEROL HCL 0.63 MG/3ML IN NEBU: 0.6300 mg | INHALATION_SOLUTION | Freq: Four times a day (QID) | RESPIRATORY_TRACT | Status: DC | PRN

## 2015-05-05 MED ORDER — DEXAMETHASONE 2 MG PO TABS: 4.0000 mg | ORAL_TABLET | Freq: Two times a day (BID) | ORAL | Status: DC

## 2015-05-05 MED ORDER — DEXTROSE 5 % IV SOLN
2.0000 g | Freq: Three times a day (TID) | INTRAVENOUS | Status: DC
  Administered 2015-05-05 – 2015-05-08 (×8): 2 g via INTRAVENOUS
  Filled 2015-05-05 (×11): qty 2

## 2015-05-05 MED ORDER — GUAIFENESIN ER 600 MG PO TB12
600.0000 mg | ORAL_TABLET | Freq: Two times a day (BID) | ORAL | Status: DC
  Administered 2015-05-05 – 2015-05-09 (×8): 600 mg via ORAL
  Filled 2015-05-05 (×9): qty 1

## 2015-05-05 MED ORDER — ENOXAPARIN SODIUM 40 MG/0.4ML ~~LOC~~ SOLN: 40.0000 mg | SUBCUTANEOUS | Status: DC

## 2015-05-05 MED ORDER — LACTULOSE 10 GM/15ML PO SOLN
10.0000 g | Freq: Two times a day (BID) | ORAL | Status: DC | PRN
  Administered 2015-05-05: 10 g via ORAL
  Filled 2015-05-05 (×2): qty 15

## 2015-05-05 MED ORDER — FAMOTIDINE 20 MG PO TABS
20.0000 mg | ORAL_TABLET | Freq: Every day | ORAL | Status: DC
  Administered 2015-05-05 – 2015-05-09 (×4): 20 mg via ORAL
  Filled 2015-05-05 (×4): qty 1

## 2015-05-05 MED ORDER — SENNOSIDES-DOCUSATE SODIUM 8.6-50 MG PO TABS
1.0000 | ORAL_TABLET | Freq: Every evening | ORAL | Status: DC | PRN
  Administered 2015-05-05 – 2015-05-07 (×3): 1 via ORAL
  Filled 2015-05-05 (×3): qty 1

## 2015-05-05 MED ORDER — SODIUM CHLORIDE 0.9 % IV SOLN
1500.0000 mg | Freq: Once | INTRAVENOUS | Status: AC
  Administered 2015-05-05: 1500 mg via INTRAVENOUS
  Filled 2015-05-05: qty 1500

## 2015-05-05 MED ORDER — SENNOSIDES 8.6 MG PO TABS: 1.0000 | ORAL_TABLET | Freq: Every evening | ORAL | Status: DC | PRN

## 2015-05-05 MED ORDER — OXYCODONE HCL 5 MG PO TABS: 5.0000 mg | ORAL_TABLET | Freq: Four times a day (QID) | ORAL | Status: DC | PRN

## 2015-05-05 MED ORDER — IOHEXOL 350 MG/ML SOLN
100.0000 mL | Freq: Once | INTRAVENOUS | Status: AC | PRN
  Administered 2015-05-05: 100 mL via INTRAVENOUS

## 2015-05-05 MED ORDER — ALPRAZOLAM 0.25 MG PO TABS
0.2500 mg | ORAL_TABLET | Freq: Two times a day (BID) | ORAL | Status: DC | PRN
  Administered 2015-05-05: 0.25 mg via ORAL
  Filled 2015-05-05: qty 1

## 2015-05-05 MED ORDER — ONDANSETRON HCL 4 MG/2ML IJ SOLN: 4.0000 mg | Freq: Four times a day (QID) | INTRAMUSCULAR | Status: DC | PRN

## 2015-05-05 MED ORDER — VANCOMYCIN HCL IN DEXTROSE 1-5 GM/200ML-% IV SOLN
1000.0000 mg | Freq: Two times a day (BID) | INTRAVENOUS | Status: DC
  Administered 2015-05-06 – 2015-05-08 (×5): 1000 mg via INTRAVENOUS
  Filled 2015-05-05 (×6): qty 200

## 2015-05-05 MED ORDER — MIRTAZAPINE 30 MG PO TABS
30.0000 mg | ORAL_TABLET | Freq: Every day | ORAL | Status: DC
  Administered 2015-05-05 – 2015-05-09 (×4): 30 mg via ORAL
  Filled 2015-05-05 (×2): qty 1
  Filled 2015-05-05: qty 2
  Filled 2015-05-05: qty 1

## 2015-05-05 MED ORDER — FLUTICASONE PROPIONATE 50 MCG/ACT NA SUSP
1.0000 | Freq: Every day | NASAL | Status: DC
  Administered 2015-05-05 – 2015-05-09 (×4): 1 via NASAL
  Filled 2015-05-05: qty 16

## 2015-05-05 MED ORDER — TAMSULOSIN HCL 0.4 MG PO CAPS
0.4000 mg | ORAL_CAPSULE | Freq: Every day | ORAL | Status: DC
Start: 2015-05-05 — End: 2015-05-09
  Administered 2015-05-05 – 2015-05-09 (×4): 0.4 mg via ORAL
  Filled 2015-05-05 (×4): qty 1

## 2015-05-05 NOTE — Progress Notes (Signed)
ANTIBIOTIC CONSULT NOTE - INITIAL  Pharmacy Consult for Vancomycin, Ceftazidime Indication: suspected endocarditis  No Active Allergies  Patient Measurements: Height: 6' (182.9 cm) Weight: 161 lb 9.6 oz (73.3 kg) IBW/kg (Calculated) : 77.6   Vital Signs: Temp: 98.1 F (36.7 C) (11/22 1200) Temp Source: Oral (11/22 1200) BP: 87/56 mmHg (11/22 1430) Pulse Rate: 93 (11/22 1430) Intake/Output from previous day:   Intake/Output from this shift: Total I/O In: 303 [I.V.:3; IV Piggyback:300] Out: 1600 [Urine:1600]  Labs:  Recent Labs  05/05/15 0545  WBC 3.1*  HGB 8.3*  PLT 143*  CREATININE 0.64   Estimated Creatinine Clearance: 80.2 mL/min (by C-G formula based on Cr of 0.64). No results for input(s): VANCOTROUGH, VANCOPEAK, VANCORANDOM, GENTTROUGH, GENTPEAK, GENTRANDOM, TOBRATROUGH, TOBRAPEAK, TOBRARND, AMIKACINPEAK, AMIKACINTROU, AMIKACIN in the last 72 hours.   Microbiology: Recent Results (from the past 720 hour(s))  MRSA PCR Screening     Status: None   Collection Time: 05/05/15  9:35 AM  Result Value Ref Range Status   MRSA by PCR NEGATIVE NEGATIVE Final    Comment:        The GeneXpert MRSA Assay (FDA approved for NASAL specimens only), is one component of a comprehensive MRSA colonization surveillance program. It is not intended to diagnose MRSA infection nor to guide or monitor treatment for MRSA infections.     Medical History: Past Medical History  Diagnosis Date  . CAD (coronary artery disease)     positive stress test in 2006 led to left heart cath (8/06) showing 95% prox RCA, 90% CFX, and 90% mLAD.  patient had a cypher DES to all 3 lesions  . Hyperlipidemia   . Hearing loss   . AAA (abdominal aortic aneurysm) ([place]) 11/09    3.2 cm   . Chronic renal insufficiency   . History of radiation therapy 03/08/04- 04/08/04    prostate 4600 cGy in 3 fractions, radioactive seed implant 05/04/04  . Myocardial infarction ([place]) 1995  . Pacemaker 2014  .  Shortness of breath     exertion  . COPD (chronic obstructive pulmonary disease) ([place])   . Radiation Jan.11-Jan 29/2016    Right central chest 35 gray in 14 fractions  . Radiation Jan.2016    35 gray in 14 fx lower lumbar/upper sacrum  . Diverticulosis   . Hemorrhoids   . Pneumonia     hx  . GERD (gastroesophageal reflux disease)   . Hypertension     no med now for 3-4 months  . Prostate cancer ([place]) 2005    gleason 7  . Skin cancer     ear  . Lung cancer ([place])   . Cancer [facility]) 2015    prostate   . Cancer [facility]) December 04, 2014    Cranial   . Cancer [facility]) 04/01/2015    spine     Assessment: 9 yoM with a h/o CAD, COPD, AAA, CKD II, non-small cell lung cancer stage IV, s/p craniotomy with tumor excision of brain metastasis on 12/04/14, recent history of PE, now on Xarelto and status post IVC filter placement on 10/26, who presents to the ER today with shortness of breath.  CT angio chest does not show acute PE, chronic SVC obstruction, large right pleural effusion.  Echo done today shows LARGE TRICUSPID vegetation.  Cards consulted.  Pharmacy consulted to dose vancomycin and ceftazidime.  Azithromycin and Ceftriaxone x 1 given to patient in ED this morning.  Today, 05/05/2015: - WBC 3.1  - Afebrile - CrCl~80 ml/min -  Blood cultures collected  Goal of Therapy:  Vancomycin trough level 15-20 mcg/ml  Doses adjusted per renal function Eradication of infection  Plan:  1.  Vancomycin 1500 mg IV x 1 loading dose then 1g IV q12h. 2.  Ceftazidime 2g IV q8h. 3.  F/u SCr, trough levels, culture results.  Hershal Coria 05/05/2015,3:42 PM

## 2015-05-05 NOTE — ED Notes (Signed)
Pt called family and told her he felt like he was dying and needed help  Pt states he has been having problems breathing and feels like there is something in his throat  Pt family states he has been c/o that feeling for months  Pt states he is also constipated  Pt states pt has been spitting up a lot of mucous

## 2015-05-05 NOTE — ED Notes (Signed)
Patient returned from X-ray 

## 2015-05-05 NOTE — ED Notes (Signed)
Called and spoke with Misty in ultrasound, to find out what ETA pt would be going for thoracentesis, so could determine if pt needed to be held in ED for procedure or go on up to floor.  Awaiting a call back.

## 2015-05-05 NOTE — ED Notes (Signed)
MD at bedside. 

## 2015-05-05 NOTE — H&P (Addendum)
[facility] History and Physical  [name]:[id number] DOB: [birth year] DOA: 05/05/2015  Referring physician:   PCP: [name], MD   Chief Complaint:    HPI:  79 year old male with a history of coronary artery disease, COPD Gold stage II, AAA, chronic kidney disease stage II, non-small cell lung cancer stage IV, status post craniotomy with tumor excision of brain metastasis on 12/04/14, recent history of pulmonary embolism, now on Xarelto and status post IVC filter placement on 14/97 without complications, who presents to the ER today with shortness of breath, no history of chest pain,   or hemoptysis. Patient also complains of coughing spells with a lot of mucus production. Patient is status post chemotherapy  with carboplatin and Alimita, every 3 weeks, last dose 05/20/14, currently receiving palliative radiotherapy to the right lung in the lumbar spinal metastasis under the care of Dr. Sondra Come. Patient is also status post stereotactic radiosurgery for spinal metastasis on 04/16/15. Patient currently receiving immunotherapy , with Nat Math (pembrolizumab), last dose was on 04/21/15. Patient continues to complain of fatigue, low back pain, denies any nausea vomiting but does complain of constipation. In the ER the patient was found to be 96% on room air, blood pressure in the low 026V systolic range. CT angio chest, does not show acute PE, chronic SVC obstruction, large right pleural effusion. Abdominal KUB shows small to moderate amount of constipation    Review of Systems: negative for the following  Constitutional: Denies fever, chills, diaphoresis, appetite change and fatigue.  HEENT: Denies photophobia, eye pain, redness, hearing loss, ear pain, congestion, sore throat, rhinorrhea, sneezing, mouth sores, trouble swallowing, neck pain, neck stiffness and tinnitus.  Respiratory:Positive for  SOB, DOE, cough, chest tightness, and wheezing.  Cardiovascular: Denies chest  pain, palpitations and leg swelling.  Gastrointestinal: Denies nausea, vomiting, abdominal pain, diarrhea,  positive for constipation, blood in stool and abdominal distention.  Genitourinary: Denies dysuria, urgency, frequency, hematuria, flank pain and difficulty urinating.  Musculoskeletal: Denies myalgias,positive for low  back pain,  denies joint swelling, arthralgias and gait problem.  Skin: Denies pallor, rash and wound.  Neurological: Denies dizziness, seizures, syncope, weakness, light-headedness, numbness and headaches.  Hematological: Denies adenopathy. Easy bruising, personal or family bleeding history  Psychiatric/Behavioral: Denies suicidal ideation, mood changes, confusion, nervousness, sleep disturbance and agitation       Past Medical History  Diagnosis Date  . CAD (coronary artery disease)     positive stress test in 2006 led to left heart cath (8/06) showing 95% prox RCA, 90% CFX, and 90% mLAD.  patient had a cypher DES to all 3 lesions  . Hyperlipidemia   . Hearing loss   . AAA (abdominal aortic aneurysm) ([place]) 11/09    3.2 cm   . Chronic renal insufficiency   . History of radiation therapy 03/08/04- 04/08/04    prostate 4600 cGy in 3 fractions, radioactive seed implant 05/04/04  . Myocardial infarction ([place]) 1995  . Pacemaker 2014  . Shortness of breath     exertion  . COPD (chronic obstructive pulmonary disease) ([place])   . Radiation Jan.11-Jan 29/2016    Right central chest 35 gray in 14 fractions  . Radiation Jan.2016    35 gray in 14 fx lower lumbar/upper sacrum  . Diverticulosis   . Hemorrhoids   . Pneumonia     hx  . GERD (gastroesophageal reflux disease)   . Hypertension     no med now for 3-4 months  . Prostate cancer ([place]) 2005  gleason 7  . Skin cancer     ear  . Lung cancer ([place])   . Cancer [facility]) 2015    prostate   . Cancer [facility]) December 04, 2014    Cranial   . Cancer [facility]) 04/01/2015    spine     Past Surgical History  Procedure  Laterality Date  . Ptca  2006  . Coronary stent placement    . Permanent pacemaker insertion Bilateral 02/26/13    MDT Adapta L implanted by Dr Rayann Heman for sick sinus syndrome  . Radioactive seed implant  05/04/2004    8000 cGy, Dr Danny Lawless Dr Reece Agar  . Elbow surgery Bilateral 1973  . Coronary angioplasty    . Mediastinoscopy N/A 01/28/2014    Procedure: MEDIASTINOSCOPY;  Surgeon: Melrose Nakayama, MD;  Location: [facility];  Service: Thoracic;  Laterality: N/A;  . Permanent pacemaker insertion N/A 02/26/2013    Procedure: PERMANENT PACEMAKER INSERTION;  Surgeon: [name], MD;  Location: [facility] CATH LAB;  Service: Cardiovascular;  Laterality: N/A;  . Insert / replace / remove pacemaker      not removed  . Craniotomy N/A 12/04/2014    Procedure: CRANIOTOMY TUMOR EXCISION, LEFT;  Surgeon: [name], MD;  Location: [place] NEURO ORS;  Service: Neurosurgery;  Laterality: N/A;  CRANIOTOMY TUMOR EXCISION  . Application of cranial navigation  12/04/2014    Procedure: APPLICATION OF CRANIAL NAVIGATION;  Surgeon: [name], MD;  Location: [place] NEURO ORS;  Service: Neurosurgery;;      Social History:  reports that he quit smoking about 21 years ago. His smoking use included Cigarettes. He has a 105 pack-year smoking history. He has never used smokeless tobacco. He reports that he does not drink alcohol or use illicit drugs.    No Active Allergies  Family History  Problem Relation Age of Onset  . Asthma Sister   . Asthma Brother   . Coronary artery disease Other          Prior to Admission medications   Medication Sig Start Date End Date Taking? Authorizing Provider  albuterol (PROVENTIL,VENTOLIN) 90 MCG/ACT inhaler Inhale 2 puffs into the lungs every 6 (six) hours as needed for wheezing or shortness of breath. 03/21/11  Yes Dorena Cookey, MD  ALPRAZolam [name]) 0.25 MG tablet Take 0.25 mg by mouth 2 (two) times daily as needed for anxiety.   Yes Historical Provider, MD   Aspirin-Acetaminophen-Caffeine (GOODY HEADACHE PO) Take 1 packet by mouth every 4 (four) hours as needed (pain/headache).   Yes Historical Provider, MD  budesonide-formoterol (SYMBICORT) 160-4.5 MCG/ACT inhaler Inhale 2 puffs into the lungs 2 (two) times daily. Patient taking differently: Inhale 2 puffs into the lungs 2 (two) times daily as needed (shortness of breath).  07/25/14  Yes [name], MD  docusate sodium (COLACE) 100 MG capsule Take 100 mg by mouth at bedtime as needed for mild constipation.   Yes Historical Provider, MD  famotidine (PEPCID) 20 MG tablet Take 20 mg by mouth at bedtime.   Yes Historical Provider, MD  fluticasone (FLONASE) 50 MCG/ACT nasal spray USE 2 SPRAYS IN EACH NOSTRIL AT NIGHT 03/10/15  Yes Historical Provider, MD  gabapentin (NEURONTIN) 100 MG capsule Take 300 mg by mouth 3 (three) times daily.   Yes Historical Provider, MD  lactulose (CHRONULAC) 10 GM/15ML solution Take 15 mLs by mouth 2 (two) times daily as needed. constipaton 04/06/15  Yes Historical Provider, MD  mirtazapine (REMERON) 30 MG tablet Take 1 tablet (30 mg  total) by mouth at bedtime. 03/31/15  Yes Curt Bears, MD  Multiple Vitamin (MULTIVITAMIN WITH MINERALS) TABS tablet Take 1 tablet by mouth daily.   Yes Historical Provider, MD  naproxen sodium (ANAPROX) 220 MG tablet Take 440 mg by mouth 2 (two) times daily as needed (pain).   Yes Historical Provider, MD  omeprazole (PRILOSEC) 40 MG capsule Take 40 mg by mouth daily before supper.  09/12/14  Yes Historical Provider, MD  oxyCODONE (OXY IR/ROXICODONE) 5 MG immediate release tablet Take 1 tablet (5 mg total) by mouth every 6 (six) hours as needed for severe pain. 03/31/15  Yes Curt Bears, MD  rivaroxaban (XARELTO) 20 MG TABS tablet Take 1 tablet (20 mg total) by mouth daily with supper. 03/31/15  Yes Curt Bears, MD  senna (SENOKOT) 8.6 MG tablet Take 1 tablet by mouth at bedtime as needed for constipation.   Yes Historical Provider, MD   tamsulosin (FLOMAX) 0.4 MG CAPS capsule Take 0.4 mg by mouth at bedtime.  10/09/14  Yes Historical Provider, MD  dexamethasone (DECADRON) 4 MG tablet Take 1 tablet (4 mg total) by mouth 2 (two) times daily. For 5 days total. Patient not taking: Reported on 04/21/2015 04/13/15   [name], MD  furosemide (LASIX) 20 MG tablet TAKE 1 TABLET (20 MG TOTAL) BY MOUTH DAILY. AS NEEDED FOR SWELLING. Patient not taking: Reported on 05/05/2015 03/10/15   [place] Adam, PA-C  nitroGLYCERIN (NITROSTAT) 0.4 MG SL tablet Place 0.4 mg under the tongue every 5 (five) minutes as needed for chest pain (MAX 3 TABLETS). 06/05/13   Dorena Cookey, MD  prochlorperazine (COMPAZINE) 10 MG tablet Take 10 mg by mouth every 6 (six) hours as needed for nausea or vomiting.    Historical Provider, MD  Sennosides (EX-LAX) 15 MG CHEW Chew 15 mg by mouth at bedtime as needed (constipation).     Historical Provider, MD     Physical Exam: Filed Vitals:   05/05/15 0722 05/05/15 0730 05/05/15 0743 05/05/15 0804  BP:  106/63 118/82   Pulse: 80 83 90   Temp:    97.1 F (36.2 C)  TempSrc:    Oral  Resp: '20 24 25   '$ SpO2: 96%  96%      Constitutional: Vital signs reviewed. Patient is a well-developed and well-nourished in no acute distress and cooperative with exam. Alert and oriented x3.  Head: Normocephalic and atraumatic  Ear: TM normal bilaterally  Mouth: no erythema or exudates, MMM  Eyes: PERRL, EOMI, conjunctivae normal, No scleral icterus.  Neck: Supple, Trachea midline normal ROM, No JVD, mass, thyromegaly, or carotid bruit present.  Cardiovascular: RRR, S1 normal, S2 normal, no MRG, pulses symmetric and intact bilaterally  Pulmonary/Chest:Decreased breath sounds at the right base,, no wheezes, rales, or rhonchi  Abdominal: Soft. Non-tender, non-distended, bowel sounds are normal, no masses, organomegaly, or guarding present.  GU: no CVA tenderness Musculoskeletal: No joint deformities, erythema, or  stiffness, ROM full and no nontender Ext: no edema and no cyanosis, pulses palpable bilaterally (DP and PT)  Hematology: no cervical, inginal, or axillary adenopathy.  Neurological: A&O x3, Strenght is normal and symmetric bilaterally, cranial nerve II-XII are grossly intact, no focal motor deficit, sensory intact to light touch bilaterally.  Skin: Warm, dry and intact. No rash, cyanosis, or clubbing.  Psychiatric: Normal mood and affect. speech and behavior is normal. Judgment and thought content normal. Cognition and memory are normal.      Data Review   Micro Results No  results found for this or any previous visit (from the past 240 hour(s)).  Radiology Reports Dg Chest 2 View  05/05/2015  CLINICAL DATA:  Acute onset of worsening shortness of breath and generalized weakness. Initial encounter. EXAM: CHEST  2 VIEW COMPARISON:  Chest radiograph performed 07/23/2014, and CT of the chest performed 03/27/2015 FINDINGS: A relatively large right-sided pleural effusion is noted, with underlying airspace opacification. Mild vascular congestion is noted. The left lung is grossly clear. No pneumothorax is seen. The cardiomediastinal silhouette is borderline normal in size. A pacemaker is noted at the left chest wall, with leads ending at the right atrium and right ventricle. No acute osseous abnormalities are seen. IMPRESSION: Increasing relatively large right-sided pleural effusion, with underlying airspace opacification. Mild vascular congestion noted. This is concerning for a malignant effusion, given the patient's known lung cancer. Underlying pneumonia cannot be entirely excluded, depending on the patient's symptoms. Electronically Signed   By: Garald Balding M.D.   On: 05/05/2015 06:36   Ir Ivc Filter Plmt / S&i /img Guid/mod Sed  04/08/2015  CLINICAL DATA:  History of DVT and recurrent pulmonary embolism. History of metastatic lung carcinoma. The patient requires an IVC filter. EXAM: 1.  ULTRASOUND GUIDANCE FOR VASCULAR ACCESS OF THE RIGHT COMMON FEMORAL VEIN. 2. IVC VENOGRAM. 3. PERCUTANEOUS IVC FILTER PLACEMENT. ANESTHESIA/SEDATION: 0.5 mg IV Versed; 25 mcg IV Fentanyl. Total Moderate Sedation Time 7 minutes. CONTRAST:  30 mL Omnipaque 300 FLUOROSCOPY TIME:  42 seconds. PROCEDURE: The procedure, risks, benefits, and alternatives were explained to the patient. Questions regarding the procedure were encouraged and answered. The patient understands and consents to the procedure. A time-out was performed prior to the procedure. The right groin was prepped with chlorhexidine in a sterile fashion, and a sterile drape was applied covering the operative field. A sterile gown and sterile gloves were used for the procedure. Local anesthesia was provided with 1% Lidocaine. Ultrasound was used to confirm patency of the right common femoral vein. Under direct ultrasound guidance, a 21 gauge needle was advanced into the right common femoral vein with ultrasound image documentation performed. After securing access with a micropuncture dilator, a guidewire was advanced into the inferior vena cava. A deployment sheath was advanced over the guidewire. This was utilized to perform IVC venography. The deployment sheath was further positioned in an appropriate location for filter deployment. A Bard Denali IVC filter was then advanced in the sheath. This was then fully deployed in the infrarenal IVC. Final filter position was confirmed with a fluoroscopic spot image. Contrast injection was also performed through the sheath under fluoroscopy to confirm patency of the IVC at the level of the filter. After the procedure the sheath was removed and hemostasis obtained with manual compression. COMPLICATIONS: None. FINDINGS: IVC venography demonstrates a normal caliber IVC with no evidence of thrombus. Renal veins are identified bilaterally. The IVC filter was successfully positioned below the level of the renal veins and is  appropriately oriented. This IVC filter has both permanent and retrievable indications. IMPRESSION: Placement of percutaneous IVC filter in infrarenal IVC. IVC venogram shows no evidence of IVC thrombus and normal caliber of the inferior vena cava. This filter does have both permanent and retrievable indications. Given the patient's history of metastatic malignancy and need for additional treatment, it is unlikely that this filter will be retrieved in the future. Electronically Signed   By: Aletta Edouard M.D.   On: 04/08/2015 16:22   Dg Abd 2 Views  05/05/2015  CLINICAL DATA:  Acute onset of mid abdominal pain and constipation. Initial encounter. EXAM: ABDOMEN - 2 VIEW COMPARISON:  CT of the abdomen and pelvis performed 03/27/2015 FINDINGS: The visualized bowel gas pattern is unremarkable. Scattered air and stool filled loops of colon are seen; no abnormal dilatation of small bowel loops is seen to suggest small bowel obstruction. No free intra-abdominal air is identified, though evaluation for free air is limited on a single supine view. The visualized osseous structures are within normal limits; the sacroiliac joints are unremarkable in appearance. A large right-sided pleural effusion is partially imaged. Pacemaker leads are partially seen. An IVC filter is noted. Brachytherapy seeds are seen overlying the prostate bed. IMPRESSION: Unremarkable bowel gas pattern; no free intra-abdominal air seen. Small to moderate amount of stool noted in the colon. Electronically Signed   By: Garald Balding M.D.   On: 05/05/2015 06:38     CBC  Recent Labs Lab 05/05/15 0545  WBC 3.1*  HGB 8.3*  HCT 28.5*  PLT 143*  MCV 87.4  MCH 25.5*  MCHC 29.1*  RDW 18.9*  LYMPHSABS 0.5*  MONOABS 0.2  EOSABS 0.3  BASOSABS 0.0    Chemistries   Recent Labs Lab 05/05/15 0545  NA 141  K 3.8  CL 106  CO2 26  GLUCOSE 95  BUN 13  CREATININE 0.64  CALCIUM 9.2  AST 13*  ALT 11*  ALKPHOS 81  BILITOT 0.7    ------------------------------------------------------------------------------------------------------------------ estimated creatinine clearance is 81.2 mL/min (by C-G formula based on Cr of 0.64). ------------------------------------------------------------------------------------------------------------------ No results for input(s): HGBA1C in the last 72 hours. ------------------------------------------------------------------------------------------------------------------ No results for input(s): CHOL, HDL, LDLCALC, TRIG, CHOLHDL, LDLDIRECT in the last 72 hours. ------------------------------------------------------------------------------------------------------------------ No results for input(s): TSH, T4TOTAL, T3FREE, THYROIDAB in the last 72 hours.  Invalid input(s): FREET3 ------------------------------------------------------------------------------------------------------------------ No results for input(s): VITAMINB12, FOLATE, FERRITIN, TIBC, IRON, RETICCTPCT in the last 72 hours.  Coagulation profile No results for input(s): INR, PROTIME in the last 168 hours.  No results for input(s): DDIMER in the last 72 hours.  Cardiac Enzymes No results for input(s): CKMB, TROPONINI, MYOGLOBIN in the last 168 hours.  Invalid input(s): CK ------------------------------------------------------------------------------------------------------------------ Invalid input(s): POCBNP   CBG: No results for input(s): GLUCAP in the last 168 hours.     EKG: Independently reviewed.  *   Assessment/Plan Shortness of breath likely secondary to large right-sided pleural effusion, likely malignant Patient will be scheduled for thoracentesis by interventional radiology, could consider placement of Pleurx catheter if oncology recommends, have added Dr Inda Merlin to consult list  Patient has been on Xarelto since October for his acute pulmonary embolism No evidence of acute PE today on  CT Admit to step down Echo done today shows [place] vegetation, I have requested cardiology, Dr Einar Gip  to consult and started patient on broad spectrum abx , may need TEE     HLD (hyperlipidemia)- stable Currently on no treatment    Coronary atherosclerosis-  2-D echo shows  LV EF: 55% -  60%, Wall motion was normal;no chest pain,      Abdominal aortic aneurysm (HCC)-stable    COPD GOLD II, with acute COPD exacerbation We'll start the patient on empiric steroids, antibiotics as the patient is immunocompromised as is currently undergoing chemotherapy, repeat chest x-ray tomorrow Patient currently on Decadron which we can hold in the setting of administering IV Solu-Medrol Continue nebulizer treatments    Non-small cell carcinoma of lung, stage 4 (HCC)-oncology will continue to follow the patient during this admission  Chronic  back pain-continue gabapentin, steroids, patient states that his ambulation has improved since his spinal surgery 2 weeks ago, does not complain of any focal deficits, no recent falls    Anemia in neoplastic disease-baseline hemoglobin around 8.5-9.0, hemoglobin at baseline    Recent  Acute pulmonary embolism (HCC)-continues Xarelto    Code Status:   full Family Communication: bedside Disposition Plan: admit   Total time spent 55 minutes.Greater than 50% of this time was spent in counseling, explanation of diagnosis, planning of further management, and coordination of care  [facility] Pager [phone]  If 7PM-7AM, please contact night-coverage [email or web address] Password [facility] 05/05/2015, 8:41 AM

## 2015-05-05 NOTE — Procedures (Signed)
Interventional Radiology Procedure Note  Procedure: US guided right thora.  .  Complications: None Recommendations:  - Ok to shower tomorrow - Do not submerge for 7 days - Routine care - sample to the lab - CXR pending   Signed,  Dulcy Fanny. Earleen Newport, DO

## 2015-05-05 NOTE — Progress Notes (Signed)
  Echocardiogram 2D Echocardiogram has been performed.  Darlina Sicilian M 05/05/2015, 2:08 PM

## 2015-05-05 NOTE — ED Provider Notes (Signed)
CSN: [id number]     Arrival date & time 05/05/15  9024 History   First MD Initiated Contact with Patient 05/05/15 0546     Chief Complaint  Patient presents with  . Shortness of Breath     (Consider location/radiation/quality/duration/timing/severity/associated sxs/prior Treatment) HPI patient has non-small cell carcinoma the lung, stage IV who is currently getting chemotherapy. His last chemotherapy was 2 weeks ago. Every 3 weeks he gets carboplatin and Alimita. He has had a craniotomy in June for a met and he is getting radiation treatment. He states he was having difficulty during the night and states he felt like he couldn't breathe. He denies chest pain or wheezing. He has a chronic cough which they feel had  been getting worse over the past week. He has not had fever. He was not diaphoretic. He has oxygen at home however he doesn't use it including tonight. His breathing seems to be better now that he is in the ED. His main complaint now is his last bowel movement was 3-5 days ago. He has mild diffuse abdominal pain and states he has chronic constipation.  PCP Dr Rhunette Croft Oncology Dr Julien Nordmann Radiation Tx Dr Tammi Klippel  Past Medical History  Diagnosis Date  . CAD (coronary artery disease)     positive stress test in 2006 led to left heart cath (8/06) showing 95% prox RCA, 90% CFX, and 90% mLAD.  patient had a cypher DES to all 3 lesions  . Hyperlipidemia   . Hearing loss   . AAA (abdominal aortic aneurysm) ([place]) 11/09    3.2 cm   . Chronic renal insufficiency   . History of radiation therapy 03/08/04- 04/08/04    prostate 4600 cGy in 3 fractions, radioactive seed implant 05/04/04  . Myocardial infarction ([place]) 1995  . Pacemaker 2014  . Shortness of breath     exertion  . COPD (chronic obstructive pulmonary disease) ([place])   . Radiation Jan.11-Jan 29/2016    Right central chest 35 gray in 14 fractions  . Radiation Jan.2016    35 gray in 14 fx lower lumbar/upper sacrum  .  Diverticulosis   . Hemorrhoids   . Pneumonia     hx  . GERD (gastroesophageal reflux disease)   . Hypertension     no med now for 3-4 months  . Prostate cancer ([place]) 2005    gleason 7  . Skin cancer     ear  . Lung cancer ([place])   . Cancer [facility]) 2015    prostate   . Cancer [facility]) December 04, 2014    Cranial   . Cancer [facility]) 04/01/2015    spine   Past Surgical History  Procedure Laterality Date  . Ptca  2006  . Coronary stent placement    . Permanent pacemaker insertion Bilateral 02/26/13    MDT Adapta L implanted by Dr Rayann Heman for sick sinus syndrome  . Radioactive seed implant  05/04/2004    8000 cGy, Dr Danny Lawless Dr Reece Agar  . Elbow surgery Bilateral 1973  . Coronary angioplasty    . Mediastinoscopy N/A 01/28/2014    Procedure: MEDIASTINOSCOPY;  Surgeon: Melrose Nakayama, MD;  Location: [facility];  Service: Thoracic;  Laterality: N/A;  . Permanent pacemaker insertion N/A 02/26/2013    Procedure: PERMANENT PACEMAKER INSERTION;  Surgeon: [name], MD;  Location: [facility] CATH LAB;  Service: Cardiovascular;  Laterality: N/A;  . Insert / replace / remove pacemaker      not removed  . Craniotomy  N/A 12/04/2014    Procedure: CRANIOTOMY TUMOR EXCISION, LEFT;  Surgeon: [name], MD;  Location: [facility] NEURO ORS;  Service: Neurosurgery;  Laterality: N/A;  CRANIOTOMY TUMOR EXCISION  . Application of cranial navigation  12/04/2014    Procedure: APPLICATION OF CRANIAL NAVIGATION;  Surgeon: [name], MD;  Location: [facility] NEURO [facility]S;  Service: Neurosurgery;;   Family History  Problem Relation Age of Onset  . Asthma Sister   . Asthma Brother   . Coronary artery disease Other    Social History  Substance Use Topics  . Smoking status: Former Smoker -- 3.00 packs/day for 35 years    Types: Cigarettes    Quit date: 07/30/1993  . Smokeless tobacco: Never Used  . Alcohol Use: No     Comment: hx alcohol abuse per note dated 04/11/11  lives at home Lives alone  Review of Systems  All  other systems reviewed and are negative.     Allergies  Review of patient's allergies indicates no active allergies.  Home Medications   Prior to Admission medications   Medication Sig Start Date End Date Taking? Authorizing Provider  albuterol (PROVENTIL,VENTOLIN) 90 MCG/ACT inhaler Inhale 2 puffs into the lungs every 6 (six) hours as needed for wheezing or shortness of breath. 03/21/11  Yes Dorena Cookey, MD  ALPRAZolam [name]) 0.25 MG tablet Take 0.25 mg by mouth 2 (two) times daily as needed for anxiety.   Yes Historical Provider, MD  Aspirin-Acetaminophen-Caffeine (GOODY HEADACHE PO) Take 1 packet by mouth every 4 (four) hours as needed (pain/headache).   Yes Historical Provider, MD  budesonide-formoterol (SYMBICORT) 160-4.5 MCG/ACT inhaler Inhale 2 puffs into the lungs 2 (two) times daily. Patient taking differently: Inhale 2 puffs into the lungs 2 (two) times daily as needed (shortness of breath).  07/25/14  Yes [name], MD  docusate sodium (COLACE) 100 MG capsule Take 100 mg by mouth at bedtime as needed for mild constipation.   Yes Historical Provider, MD  famotidine (PEPCID) 20 MG tablet Take 20 mg by mouth at bedtime.   Yes Historical Provider, MD  fluticasone (FLONASE) 50 MCG/ACT nasal spray USE 2 SPRAYS IN EACH NOSTRIL AT NIGHT 03/10/15  Yes Historical Provider, MD  gabapentin (NEURONTIN) 100 MG capsule Take 300 mg by mouth 3 (three) times daily.   Yes Historical Provider, MD  lactulose (CHRONULAC) 10 GM/15ML solution Take 15 mLs by mouth 2 (two) times daily as needed. constipaton 04/06/15  Yes Historical Provider, MD  mirtazapine (REMERON) 30 MG tablet Take 1 tablet (30 mg total) by mouth at bedtime. 03/31/15  Yes Curt Bears, MD  Multiple Vitamin (MULTIVITAMIN WITH MINERALS) TABS tablet Take 1 tablet by mouth daily.   Yes Historical Provider, MD  naproxen sodium (ANAPROX) 220 MG tablet Take 440 mg by mouth 2 (two) times daily as needed (pain).   Yes Historical  Provider, MD  omeprazole (PRILOSEC) 40 MG capsule Take 40 mg by mouth daily before supper.  09/12/14  Yes Historical Provider, MD  oxyCODONE (OXY IR/ROXICODONE) 5 MG immediate release tablet Take 1 tablet (5 mg total) by mouth every 6 (six) hours as needed for severe pain. 03/31/15  Yes Curt Bears, MD  rivaroxaban (XARELTO) 20 MG TABS tablet Take 1 tablet (20 mg total) by mouth daily with supper. 03/31/15  Yes Curt Bears, MD  senna (SENOKOT) 8.6 MG tablet Take 1 tablet by mouth at bedtime as needed for constipation.   Yes Historical Provider, MD  tamsulosin (FLOMAX) 0.4 MG CAPS capsule Take  0.4 mg by mouth at bedtime.  10/09/14  Yes Historical Provider, MD  dexamethasone (DECADRON) 4 MG tablet Take 1 tablet (4 mg total) by mouth 2 (two) times daily. For 5 days total. Patient not taking: Reported on 04/21/2015 04/13/15   [name], MD  furosemide (LASIX) 20 MG tablet TAKE 1 TABLET (20 MG TOTAL) BY MOUTH DAILY. AS NEEDED FOR SWELLING. Patient not taking: Reported on 05/05/2015 03/10/15   Carlton Adam, PA-C  nitroGLYCERIN (NITROSTAT) 0.4 MG SL tablet Place 0.4 mg under the tongue every 5 (five) minutes as needed for chest pain (MAX 3 TABLETS). 06/05/13   Dorena Cookey, MD  prochlorperazine (COMPAZINE) 10 MG tablet Take 10 mg by mouth every 6 (six) hours as needed for nausea or vomiting.    Historical Provider, MD  Sennosides (EX-LAX) 15 MG CHEW Chew 15 mg by mouth at bedtime as needed (constipation).     Historical Provider, MD   BP 115/83 mmHg  Pulse 94  Temp(Src) 97.7 F (36.5 C) (Oral)  Resp 23  SpO2 97%  Vital signs normal   Physical Exam  Constitutional: He is oriented to person, place, and time. He appears well-developed and well-nourished.  Non-toxic appearance. He does not appear ill. No distress.  HENT:  Head: Normocephalic and atraumatic.  Right Ear: External ear normal.  Left Ear: External ear normal.  Nose: Nose normal. No mucosal edema or rhinorrhea.   Mouth/Throat: Oropharynx is clear and moist and mucous membranes are normal. No dental abscesses or uvula swelling.  Eyes: Conjunctivae and EOM are normal. Pupils are equal, round, and reactive to light.  Neck: Normal range of motion and full passive range of motion without pain. Neck supple.  Cardiovascular: Normal rate, regular rhythm and normal heart sounds.  Exam reveals no gallop and no friction rub.   No murmur heard. Pulmonary/Chest: Effort normal and breath sounds normal. No respiratory distress. He has no wheezes. He has no rhonchi. He has no rales. He exhibits no tenderness and no crepitus.  Abdominal: Soft. Normal appearance and bowel sounds are normal. He exhibits no distension. There is no tenderness. There is no rebound and no guarding.  Musculoskeletal: Normal range of motion. He exhibits no edema or tenderness.  Moves all extremities well.   Neurological: He is alert and oriented to person, place, and time. He has normal strength. No cranial nerve deficit.  Skin: Skin is warm, dry and intact. No rash noted. No erythema. There is pallor.  Psychiatric: He has a normal mood and affect. His speech is normal and behavior is normal. His mood appears not anxious.  Nursing note and vitals reviewed.   ED Course  Procedures (including critical care time)    Patient does not appear to be in respiratory distress during my exam. His lungs were clear. He had x-rays and lab work done.  07:35 Dr Allyson Sabal, will admit and do bed request.   Pt given his test results and need for admission. His main concern is "my bowels".   Labs Review Results for orders placed or performed during the hospital encounter of 05/05/15  Comprehensive metabolic panel  Result Value Ref Range   Sodium 141 135 - 145 mmol/L   Potassium 3.8 3.5 - 5.1 mmol/L   Chloride 106 101 - 111 mmol/L   CO2 26 22 - 32 mmol/L   Glucose, Bld 95 65 - 99 mg/dL   BUN 13 6 - 20 mg/dL   Creatinine, Ser 0.64 0.61 - 1.24 mg/dL  Calcium 9.2 8.9 - 10.3 mg/dL   Total Protein 6.5 6.5 - 8.1 g/dL   Albumin 3.0 (L) 3.5 - 5.0 g/dL   AST 13 (L) 15 - 41 U/L   ALT 11 (L) 17 - 63 U/L   Alkaline Phosphatase 81 38 - 126 U/L   Total Bilirubin 0.7 0.3 - 1.2 mg/dL   GFR calc non Af Amer >60 >60 mL/min   GFR calc Af Amer >60 >60 mL/min   Anion gap 9 5 - 15  Brain natriuretic peptide  Result Value Ref Range   B Natriuretic Peptide 74.3 0.0 - 100.0 pg/mL  CBC with Differential  Result Value Ref Range   WBC 3.1 (L) 4.0 - 10.5 K/uL   RBC 3.26 (L) 4.22 - 5.81 MIL/uL   Hemoglobin 8.3 (L) 13.0 - 17.0 g/dL   HCT 28.5 (L) 39.0 - 52.0 %   MCV 87.4 78.0 - 100.0 fL   MCH 25.5 (L) 26.0 - 34.0 pg   MCHC 29.1 (L) 30.0 - 36.0 g/dL   RDW 18.9 (H) 11.5 - 15.5 %   Platelets 143 (L) 150 - 400 K/uL   Neutrophils Relative % 68 %   Neutro Abs 2.1 1.7 - 7.7 K/uL   Lymphocytes Relative 16 %   Lymphs Abs 0.5 (L) 0.7 - 4.0 K/uL   Monocytes Relative 8 %   Monocytes Absolute 0.2 0.1 - 1.0 K/uL   Eosinophils Relative 8 %   Eosinophils Absolute 0.3 0.0 - 0.7 K/uL   Basophils Relative 0 %   Basophils Absolute 0.0 0.0 - 0.1 K/uL  I-stat troponin, ED  Result Value Ref Range   Troponin i, poc 0.00 0.00 - 0.08 ng/mL   Comment 3            Laboratory interpretation all normal except anemia, low WBC without neutropenia, stable anemia, malnutrition   Imaging Review Dg Chest 2 View  05/05/2015  CLINICAL DATA:  Acute onset of worsening shortness of breath and generalized weakness. Initial encounter. EXAM: CHEST  2 VIEW COMPARISON:  Chest radiograph performed 07/23/2014, and CT of the chest performed 03/27/2015 FINDINGS: A relatively large right-sided pleural effusion is noted, with underlying airspace opacification. Mild vascular congestion is noted. The left lung is grossly clear. No pneumothorax is seen. The cardiomediastinal silhouette is borderline normal in size. A pacemaker is noted at the left chest wall, with leads ending at the right atrium  and right ventricle. No acute osseous abnormalities are seen. IMPRESSION: Increasing relatively large right-sided pleural effusion, with underlying airspace opacification. Mild vascular congestion noted. This is concerning for a malignant effusion, given the patient's known lung cancer. Underlying pneumonia cannot be entirely excluded, depending on the patient's symptoms. Electronically Signed   By: Garald Balding M.D.   On: 05/05/2015 06:36   Dg Abd 2 Views  05/05/2015  CLINICAL DATA:  Acute onset of mid abdominal pain and constipation. Initial encounter. EXAM: ABDOMEN - 2 VIEW COMPARISON:  CT of the abdomen and pelvis performed 03/27/2015 FINDINGS: The visualized bowel gas pattern is unremarkable. Scattered air and stool filled loops of colon are seen; no abnormal dilatation of small bowel loops is seen to suggest small bowel obstruction. No free intra-abdominal air is identified, though evaluation for free air is limited on a single supine view. The visualized osseous structures are within normal limits; the sacroiliac joints are unremarkable in appearance. A large right-sided pleural effusion is partially imaged. Pacemaker leads are partially seen. An IVC filter is noted. Brachytherapy seeds  are seen overlying the prostate bed. IMPRESSION: Unremarkable bowel gas pattern; no free intra-abdominal air seen. Small to moderate amount of stool noted in the colon. Electronically Signed   By: Garald Balding M.D.   On: 05/05/2015 06:38      04/08/2015  CLINICAL DATA:  History of DVT and recurrent pulmonary embolism. History of metastatic lung carcinoma. The patient requires an IVC filter.  IMPRESSION: Placement of percutaneous IVC filter in infrarenal IVC. IVC venogram shows no evidence of IVC thrombus and normal caliber of the inferior vena cava. This filter does have both permanent and retrievable indications. Given the patient's history of metastatic malignancy and need for additional treatment, it is unlikely  that this filter will be retrieved in the future. Electronically Signed   By: Aletta Edouard M.D.   On: 04/08/2015 16:22     I have personally reviewed and evaluated these images and lab results as part of my medical decision-making.   EKG Interpretation   Date/Time:  Tuesday May 05 2015 05:38:00 EST Ventricular Rate:  94 PR Interval:  210 QRS Duration: 153 QT Interval:  409 QTC Calculation: 511 R Axis:   64 Text Interpretation:  Sinus or ectopic atrial rhythm Borderline prolonged  PR interval Right bundle branch block Inferior infarct, old Baseline  wander No significant change since last tracing 21 Jul 2014 Confirmed by  [facility]  MD-I, Mychelle Kendra ([id number]) on 05/05/2015 5:46:47 AM      MDM   Final diagnoses:  SOB (shortness of breath)  Pleural effusion on right  Other constipation    Plan admission   Rolland Porter, MD, Barbette Or, MD 05/05/15 [phone]

## 2015-05-05 NOTE — ED Notes (Signed)
Patient transported to CT 

## 2015-05-06 ENCOUNTER — Encounter (HOSPITAL_COMMUNITY): Payer: Self-pay | Admitting: *Deleted

## 2015-05-06 ENCOUNTER — Telehealth: Payer: Self-pay | Admitting: *Deleted

## 2015-05-06 ENCOUNTER — Other Ambulatory Visit (HOSPITAL_COMMUNITY): Payer: Medicare Other

## 2015-05-06 ENCOUNTER — Encounter (HOSPITAL_COMMUNITY)
Admission: EM | Disposition: A | Discharge status: Skilled Nursing Facility | Payer: Self-pay | Source: Home / Self Care | Attending: Internal Medicine

## 2015-05-06 ENCOUNTER — Ambulatory Visit (HOSPITAL_COMMUNITY)
Admit: 2015-05-06 | Discharge: 2015-05-06 | Disposition: A | Discharge status: Home / Self Care | Payer: Medicare Other | Attending: Internal Medicine | Admitting: Internal Medicine

## 2015-05-06 DIAGNOSIS — C7931 Secondary malignant neoplasm of brain: Secondary | ICD-10-CM

## 2015-05-06 DIAGNOSIS — Z86711 Personal history of pulmonary embolism: Secondary | ICD-10-CM

## 2015-05-06 DIAGNOSIS — J918 Pleural effusion in other conditions classified elsewhere: Secondary | ICD-10-CM

## 2015-05-06 DIAGNOSIS — R222 Localized swelling, mass and lump, trunk: Secondary | ICD-10-CM

## 2015-05-06 HISTORY — PX: TEE WITHOUT CARDIOVERSION: SHX5443

## 2015-05-06 LAB — COMPREHENSIVE METABOLIC PANEL
ALT: 11 U/L — AB (ref 17–63)
AST: 18 U/L (ref 15–41)
Albumin: 3 g/dL — ABNORMAL LOW (ref 3.5–5.0)
Alkaline Phosphatase: 81 U/L (ref 38–126)
Anion gap: 9 (ref 5–15)
BILIRUBIN TOTAL: 0.7 mg/dL (ref 0.3–1.2)
BUN: 14 mg/dL (ref 6–20)
CALCIUM: 9.2 mg/dL (ref 8.9–10.3)
CO2: 27 mmol/L (ref 22–32)
CREATININE: 0.81 mg/dL (ref 0.61–1.24)
Chloride: 103 mmol/L (ref 101–111)
GFR calc Af Amer: >60 mL/min (ref >60)
Glucose, Bld: 160 mg/dL — ABNORMAL HIGH (ref 65–99)
Potassium: 4.2 mmol/L (ref 3.5–5.1)
Sodium: 139 mmol/L (ref 135–145)
TOTAL PROTEIN: 6.5 g/dL (ref 6.5–8.1)

## 2015-05-06 LAB — CBC
HCT: 29.2 % — ABNORMAL LOW (ref 39.0–52.0)
Hemoglobin: 8.7 g/dL — ABNORMAL LOW (ref 13.0–17.0)
MCH: 26.3 pg (ref 26.0–34.0)
MCHC: 29.8 g/dL — ABNORMAL LOW (ref 30.0–36.0)
MCV: 88.2 fL (ref 78.0–100.0)
PLATELETS: 164 10*3/uL (ref 150–400)
RBC: 3.31 MIL/uL — ABNORMAL LOW (ref 4.22–5.81)
RDW: 18.9 % — AB (ref 11.5–15.5)
WBC: 5.2 10*3/uL (ref 4.0–10.5)

## 2015-05-06 SURGERY — ECHOCARDIOGRAM, TRANSESOPHAGEAL
Anesthesia: Moderate Sedation

## 2015-05-06 MED ORDER — CETYLPYRIDINIUM CHLORIDE 0.05 % MT LIQD
7.0000 mL | Freq: Two times a day (BID) | OROMUCOSAL | Status: DC
  Administered 2015-05-06 – 2015-05-08 (×4): 7 mL via OROMUCOSAL

## 2015-05-06 MED ORDER — CHLORHEXIDINE GLUCONATE 0.12 % MT SOLN
15.0000 mL | Freq: Two times a day (BID) | OROMUCOSAL | Status: DC
  Administered 2015-05-06 – 2015-05-09 (×4): 15 mL via OROMUCOSAL
  Filled 2015-05-06 (×6): qty 15

## 2015-05-06 MED ORDER — MIDAZOLAM HCL 5 MG/ML IJ SOLN
INTRAMUSCULAR | Status: AC
  Filled 2015-05-06: qty 2

## 2015-05-06 MED ORDER — FENTANYL CITRATE (PF) 100 MCG/2ML IJ SOLN
INTRAMUSCULAR | Status: AC
  Filled 2015-05-06: qty 2

## 2015-05-06 MED ORDER — FENTANYL CITRATE (PF) 100 MCG/2ML IJ SOLN
INTRAMUSCULAR | Status: DC | PRN
  Administered 2015-05-06 (×2): 25 ug via INTRAVENOUS

## 2015-05-06 MED ORDER — MIDAZOLAM HCL 10 MG/2ML IJ SOLN
INTRAMUSCULAR | Status: DC | PRN
  Administered 2015-05-06 (×2): 2 mg via INTRAVENOUS

## 2015-05-06 MED ORDER — SODIUM CHLORIDE 0.9 % IV SOLN: INTRAVENOUS | Status: DC

## 2015-05-06 MED ORDER — ENSURE ENLIVE PO LIQD
237.0000 mL | Freq: Two times a day (BID) | ORAL | Status: DC
  Administered 2015-05-06 – 2015-05-08 (×4): 237 mL via ORAL

## 2015-05-06 MED ORDER — ENSURE ENLIVE PO LIQD: 237.0000 mL | Freq: Two times a day (BID) | ORAL | Status: DC

## 2015-05-06 MED ORDER — BUTAMBEN-TETRACAINE-BENZOCAINE 2-2-14 % EX AERO
INHALATION_SPRAY | CUTANEOUS | Status: DC | PRN
  Administered 2015-05-06: 2 via TOPICAL

## 2015-05-06 NOTE — Interval H&P Note (Signed)
History and Physical Interval Note:  05/06/2015 12:56 PM  [name]  has presented today for surgery, with the diagnosis of bacteremia  The various methods of treatment have been discussed with the patient and family. After consideration of risks, benefits and other options for treatment, the patient has consented to  Procedure(s): TRANSESOPHAGEAL ECHOCARDIOGRAM (TEE) (N/A) as a surgical intervention .  The patient's history has been reviewed, patient examined, no change in status, stable for surgery.  I have reviewed the patient's chart and labs.  Questions were answered to the patient's satisfaction.   I also met him and his Girlfriend last evening and explained the procedure and compliacations in detail  [place], Ronrico Dupin

## 2015-05-06 NOTE — Progress Notes (Signed)
Echocardiogram Echocardiogram Transesophageal has been performed.  Joelene Millin 05/06/2015, 1:43 PM

## 2015-05-06 NOTE — H&P (View-Only) (Signed)
[facility] PROGRESS NOTE   Nicholas Schmitt QBH:[id number] DOB: 09-22-1935 DOA: 05/05/2015 PCP: [name], MD  HPI/Subjective: Denies any shortness of breath, denies any pain. On 2 L of oxygen.  Assessment/Plan: Active Problems:   HLD (hyperlipidemia)   Coronary atherosclerosis   Abdominal aortic aneurysm (HCC)   COPD GOLD II   Non-small cell carcinoma of lung, stage 4 (HCC)   Dyspnea   Anemia in neoplastic disease   SOB (shortness of breath)   DVT (deep venous thrombosis) (HCC)   Acute pulmonary embolism (HCC)   Shortness of breath likely secondary to large right-sided pleural effusion, likely malignant Presented the hospital with shortness of breath, interventional radiology consulted. Right-sided US guided paracentesis done yesterday with removal of 650 mL of serosanguineous fluids. Patient has been on Xarelto since October for his acute pulmonary embolism  Tricuspid valve vegetation Echo done today shows LARGE TRICUSPID vegetation, Dr Einar Gip consulted, may need TEE. I will consult ID for antibiotics management.  HLD (hyperlipidemia)- stable Currently on no treatment  Coronary atherosclerosis- 2-D echo shows LV EF: 55% -  60%, Wall motion was normal;no chest pain,   Abdominal aortic aneurysm stable  COPD GOLD II, with acute COPD exacerbation We'll start the patient on empiric steroids, antibiotics as the patient is immunocompromised as is currently undergoing chemotherapy, repeat chest x-ray tomorrow Patient currently on Decadron which we can hold in the setting of administering IV Solu-Medrol Continue nebulizer treatments  Non-small cell carcinoma of lung, stage 4 Oncology will continue to follow the patient during this admission  Chronic back pain Continue gabapentin, steroids, patient states that his ambulation has improved since his spinal surgery 2 weeks ago, does not complain of any focal deficits, no recent falls  Anemia in  neoplastic disease Baseline hemoglobin around 8.5-9.0, hemoglobin at baseline  Recent Acute pulmonary embolism Continues Xarelto  Code Status: DNR Family Communication: Plan discussed with the patient. Disposition Plan: Remains inpatient, transfer to telemetry. Diet: Diet NPO time specified  Consultants:  Oncology.  ID  Procedures:  None  Antibiotics:  Ceftaz and vancomycin was started on 11/22   Objective: Filed Vitals:   05/06/15 0700 05/06/15 0800  BP: 118/63 113/65  Pulse: 111 75  Temp:  97.7 F (36.5 C)  Resp: 23 17    Intake/Output Summary (Last 24 hours) at 05/06/15 1027 Last data filed at 05/06/15 0940  Gross per 24 hour  Intake   1466 ml  Output   2050 ml  Net   -584 ml   Filed Weights   05/05/15 1100  Weight: 73.3 kg (161 lb 9.6 oz)    Exam: General: Alert and awake, oriented x3, not in any acute distress. HEENT: anicteric sclera, pupils reactive to light and accommodation, EOMI CVS: S1-S2 clear, no murmur rubs or gallops Chest: clear to auscultation bilaterally, no wheezing, rales or rhonchi Abdomen: soft nontender, nondistended, normal bowel sounds, no organomegaly Extremities: no cyanosis, clubbing or edema noted bilaterally Neuro: Cranial nerves II-XII intact, no focal neurological deficits  Data Reviewed: Basic Metabolic Panel:  Recent Labs Lab 05/05/15 0545 05/05/15 0805 05/06/15 0325  NA 141  --  139  K 3.8  --  4.2  CL 106  --  103  CO2 26  --  27  GLUCOSE 95  --  160*  BUN 13  --  14  CREATININE 0.64  --  0.81  CALCIUM 9.2  --  9.2  MG  --  1.7  --    Liver Function  Tests:  Recent Labs Lab 05/05/15 0545 05/06/15 0325  AST 13* 18  ALT 11* 11*  ALKPHOS 81 81  BILITOT 0.7 0.7  PROT 6.5 6.5  ALBUMIN 3.0* 3.0*   No results for input(s): LIPASE, AMYLASE in the last 168 hours. No results for input(s): AMMONIA in the last 168 hours. CBC:  Recent Labs Lab 05/05/15 0545 05/06/15 0325  WBC 3.1* 5.2  NEUTROABS  2.1  --   HGB 8.3* 8.7*  HCT 28.5* 29.2*  MCV 87.4 88.2  PLT 143* 164   Cardiac Enzymes:  Recent Labs Lab 05/05/15 0805 05/05/15 1836  TROPONINI <0.03 0.10*   BNP (last 3 results)  Recent Labs  07/21/14 1821 05/05/15 0556  BNP 39.7 74.3    ProBNP (last 3 results) No results for input(s): PROBNP in the last 8760 hours.  CBG: No results for input(s): GLUCAP in the last 168 hours.  Micro Recent Results (from the past 240 hour(s))  MRSA PCR Screening     Status: None   Collection Time: 05/05/15  9:35 AM  Result Value Ref Range Status   MRSA by PCR NEGATIVE NEGATIVE Final    Comment:        The GeneXpert MRSA Assay (FDA approved for NASAL specimens only), is one component of a comprehensive MRSA colonization surveillance program. It is not intended to diagnose MRSA infection nor to guide or monitor treatment for MRSA infections.   Body fluid culture     Status: None (Preliminary result)   Collection Time: 05/05/15  6:13 PM  Result Value Ref Range Status   Specimen Description PLEURAL RIGHT  Final   Special Requests NONE  Final   Gram Stain   Final    MODERATE WBC PRESENT, PREDOMINANTLY MONONUCLEAR NO ORGANISMS SEEN Performed at [facility]    Culture PENDING  Incomplete   Report Status PENDING  Incomplete     Studies: Dg Chest 1 View  05/05/2015  CLINICAL DATA:  Post right thoracentesis. EXAM: CHEST 1 VIEW COMPARISON:  05/05/2015 FINDINGS: Persistent right pleural effusion with probable consolidation in the right lung base causing tenting of the hemidiaphragm. Mild improvement of aeration in the right lung. Prominence of the right hilum with volume loss in the right upper lung suggests obstructing mass lesion. No pneumothorax. Left lung appears clear and expanded with mild hyperinflation. Heart size and pulmonary vascularity are normal for technique. Calcification of the aorta. Cardiac pacemaker. Degenerative changes in the spine and shoulders.  IMPRESSION: Persistent right pleural effusion with mild improvement of aeration in the right lung post thoracentesis. No pneumothorax. The volume loss in the right upper lung with right hilar prominence suggesting obstructing mass lesion. Electronically Signed   By: Lucienne Capers M.D.   On: 05/05/2015 18:29   Dg Chest 2 View  05/05/2015  CLINICAL DATA:  Acute onset of worsening shortness of breath and generalized weakness. Initial encounter. EXAM: CHEST  2 VIEW COMPARISON:  Chest radiograph performed 07/23/2014, and CT of the chest performed 03/27/2015 FINDINGS: A relatively large right-sided pleural effusion is noted, with underlying airspace opacification. Mild vascular congestion is noted. The left lung is grossly clear. No pneumothorax is seen. The cardiomediastinal silhouette is borderline normal in size. A pacemaker is noted at the left chest wall, with leads ending at the right atrium and right ventricle. No acute osseous abnormalities are seen. IMPRESSION: Increasing relatively large right-sided pleural effusion, with underlying airspace opacification. Mild vascular congestion noted. This is concerning for a malignant effusion, given the  patient's known lung cancer. Underlying pneumonia cannot be entirely excluded, depending on the patient's symptoms. Electronically Signed   By: Garald Balding M.D.   On: 05/05/2015 06:36   Ct Angio Chest Pe W/cm &/or Wo Cm  05/05/2015  CLINICAL DATA:  Shortness of breath. Pleural effusion. History pulmonary embolism. EXAM: CT ANGIOGRAPHY CHEST WITH CONTRAST TECHNIQUE: Multidetector CT imaging of the chest was performed using the standard protocol during bolus administration of intravenous contrast. Multiplanar CT image reconstructions and MIPs were obtained to evaluate the vascular anatomy. CONTRAST:  173m OMNIPAQUE IOHEXOL 350 MG/ML SOLN COMPARISON:  03/27/2015 FINDINGS: THORACIC INLET/BODY WALL: Body wall edema and extensive venous collaterals, both intra and  extra thoracic, secondary to chronic SVC obstruction. No axillary or supraclavicular adenopathy detected. MEDIASTINUM: No cardiomegaly or pericardial effusion. Dual-chamber pacer from the right. Suboptimal opacification of the pulmonary arterial tree due to SVC obstruction with bolus dispersion and intermittent motion. Right-sided pulmonary arteries are distorted and attenuated without central acute filling defect. Previous pulmonary embolism to the right lower lobe, with peripheral thick walled appearance compatible with endothelialized clot. No convincing pulmonary embolism, limited in subsegmental arteries. No acute aortic finding. No progressive mediastinal adenopathy. LUNG WINDOWS: Extensive atelectasis and scarring around the right hilum with bronchial distortion. There is a progressive and large layering right pleural effusion without pleural nodularity or thickening. No superimposed consolidation or edema. UPPER ABDOMEN: Stable bilateral adrenal nodularity, presumed metastases. No acute finding. OSSEOUS: No acute finding. Review of the MIP images confirms the above findings. IMPRESSION: 1. No convincing acute pulmonary embolism. 2. Partial re- cannulization of recent right lower lobe pulmonary embolism. 3. Chronic SVC obstruction which limits CTA. 4. Large and progressive layering right pleural effusion. Electronically Signed   By: JMonte FantasiaM.D.   On: 05/05/2015 08:42   Dg Abd 2 Views  05/05/2015  CLINICAL DATA:  Acute onset of mid abdominal pain and constipation. Initial encounter. EXAM: ABDOMEN - 2 VIEW COMPARISON:  CT of the abdomen and pelvis performed 03/27/2015 FINDINGS: The visualized bowel gas pattern is unremarkable. Scattered air and stool filled loops of colon are seen; no abnormal dilatation of small bowel loops is seen to suggest small bowel obstruction. No free intra-abdominal air is identified, though evaluation for free air is limited on a single supine view. The visualized osseous  structures are within normal limits; the sacroiliac joints are unremarkable in appearance. A large right-sided pleural effusion is partially imaged. Pacemaker leads are partially seen. An IVC filter is noted. Brachytherapy seeds are seen overlying the prostate bed. IMPRESSION: Unremarkable bowel gas pattern; no free intra-abdominal air seen. Small to moderate amount of stool noted in the colon. Electronically Signed   By: JGarald BaldingM.D.   On: 05/05/2015 06:38   [place]oreaThoracentesis Asp Pleural Space W/img Guide  05/05/2015  CLINICAL DATA:  79year old male with a history of new right-sided pleural effusion. He has been referred for diagnostic and therapeutic thoracentesis. EXAM: ULTRASOUND GUIDED right THORACENTESIS COMPARISON:  CT 05/05/2015 PROCEDURE: An ultrasound guided thoracentesis was thoroughly discussed with the patient and questions answered. The benefits, risks, alternatives and complications were also discussed. The patient understands and wishes to proceed with the procedure. Written consent was obtained. Ultrasound was performed to localize and mark an adequate pocket of fluid in the right chest. The area was then prepped and draped in the normal sterile fashion. 1% Lidocaine was used for local anesthesia. Under ultrasound guidance a Safe-T-Centesis catheter was introduced. Thoracentesis was performed. The catheter was removed  and a dressing applied. Patient tolerated the procedure well and remained hemodynamically stable throughout. No complications encountered. COMPLICATIONS: None FINDINGS: A total of approximately 650 cc of serosanguineous fluid was removed. A fluid sample wassent for laboratory analysis. IMPRESSION: Status post right-sided thoracentesis.  650 cc of fluid removed. Sample was sent to the lab. Signed, Dulcy Fanny. Earleen Newport, DO [facility] Radiology Electronically Signed   By: Corrie Mckusick D.O.   On: 05/05/2015 18:26    Scheduled  Meds: . antiseptic oral rinse  7 mL Mouth Rinse q12n4p  . budesonide-formoterol  2 puff Inhalation BID  . cefTAZidime (FORTAZ)  IV  2 g Intravenous 3 times per day  . chlorhexidine  15 mL Mouth Rinse BID  . famotidine  20 mg Oral QHS  . fluticasone  1 spray Each Nare Daily  . gabapentin  300 mg Oral TID  . guaiFENesin  600 mg Oral BID  . methylPREDNISolone (SOLU-MEDROL) injection  40 mg Intravenous 3 times per day  . mirtazapine  30 mg Oral QHS  . pantoprazole  40 mg Oral Daily  . rivaroxaban  20 mg Oral Q supper  . sodium chloride  3 mL Intravenous Q12H  . sodium chloride  3 mL Intravenous Q12H  . tamsulosin  0.4 mg Oral QHS  . vancomycin  1,000 mg Intravenous Q12H   Continuous Infusions:      Time spent: 35 minutes    [facility]  [facility] Pager [phone] If 7PM-7AM, please contact night-coverage at [email or web address], password [facility] 05/06/2015, 10:27 AM  LOS: 1 day

## 2015-05-06 NOTE — Progress Notes (Signed)
[facility] PROGRESS NOTE   KRISHON ADKISON ZOX:[id number] DOB: 05-Jan-1936 DOA: 05/05/2015 PCP: [name], MD  HPI/Subjective: Denies any shortness of breath, denies any pain. On 2 L of oxygen.  Assessment/Plan: Active Problems:   HLD (hyperlipidemia)   Coronary atherosclerosis   Abdominal aortic aneurysm (HCC)   COPD GOLD II   Non-small cell carcinoma of lung, stage 4 (HCC)   Dyspnea   Anemia in neoplastic disease   SOB (shortness of breath)   DVT (deep venous thrombosis) (HCC)   Acute pulmonary embolism (HCC)   Shortness of breath likely secondary to large right-sided pleural effusion, likely malignant Presented the hospital with shortness of breath, interventional radiology consulted. Right-sided US guided paracentesis done yesterday with removal of 650 mL of serosanguineous fluids. Patient has been on Xarelto since October for his acute pulmonary embolism  Tricuspid valve vegetation Echo done today shows LARGE TRICUSPID vegetation, Dr Einar Gip consulted, may need TEE. I will consult ID for antibiotics management.  HLD (hyperlipidemia)- stable Currently on no treatment  Coronary atherosclerosis- 2-D echo shows LV EF: 55% -  60%, Wall motion was normal;no chest pain,   Abdominal aortic aneurysm stable  COPD GOLD II, with acute COPD exacerbation We'll start the patient on empiric steroids, antibiotics as the patient is immunocompromised as is currently undergoing chemotherapy, repeat chest x-ray tomorrow Patient currently on Decadron which we can hold in the setting of administering IV Solu-Medrol Continue nebulizer treatments  Non-small cell carcinoma of lung, stage 4 Oncology will continue to follow the patient during this admission  Chronic back pain Continue gabapentin, steroids, patient states that his ambulation has improved since his spinal surgery 2 weeks ago, does not complain of any focal deficits, no recent falls  Anemia in  neoplastic disease Baseline hemoglobin around 8.5-9.0, hemoglobin at baseline  Recent Acute pulmonary embolism Continues Xarelto  Code Status: DNR Family Communication: Plan discussed with the patient. Disposition Plan: Remains inpatient, transfer to telemetry. Diet: Diet NPO time specified  Consultants:  Oncology.  ID  Procedures:  None  Antibiotics:  Ceftaz and vancomycin was started on 11/22   Objective: Filed Vitals:   05/06/15 0700 05/06/15 0800  BP: 118/63 113/65  Pulse: 111 75  Temp:  97.7 F (36.5 C)  Resp: 23 17    Intake/Output Summary (Last 24 hours) at 05/06/15 1027 Last data filed at 05/06/15 0940  Gross per 24 hour  Intake   1466 ml  Output   2050 ml  Net   -584 ml   Filed Weights   05/05/15 1100  Weight: 73.3 kg (161 lb 9.6 oz)    Exam: General: Alert and awake, oriented x3, not in any acute distress. HEENT: anicteric sclera, pupils reactive to light and accommodation, EOMI CVS: S1-S2 clear, no murmur rubs or gallops Chest: clear to auscultation bilaterally, no wheezing, rales or rhonchi Abdomen: soft nontender, nondistended, normal bowel sounds, no organomegaly Extremities: no cyanosis, clubbing or edema noted bilaterally Neuro: Cranial nerves II-XII intact, no focal neurological deficits  Data Reviewed: Basic Metabolic Panel:  Recent Labs Lab 05/05/15 0545 05/05/15 0805 05/06/15 0325  NA 141  --  139  K 3.8  --  4.2  CL 106  --  103  CO2 26  --  27  GLUCOSE 95  --  160*  BUN 13  --  14  CREATININE 0.64  --  0.81  CALCIUM 9.2  --  9.2  MG  --  1.7  --    Liver Function  Tests:  Recent Labs Lab 05/05/15 0545 05/06/15 0325  AST 13* 18  ALT 11* 11*  ALKPHOS 81 81  BILITOT 0.7 0.7  PROT 6.5 6.5  ALBUMIN 3.0* 3.0*   No results for input(s): LIPASE, AMYLASE in the last 168 hours. No results for input(s): AMMONIA in the last 168 hours. CBC:  Recent Labs Lab 05/05/15 0545 05/06/15 0325  WBC 3.1* 5.2  NEUTROABS  2.1  --   HGB 8.3* 8.7*  HCT 28.5* 29.2*  MCV 87.4 88.2  PLT 143* 164   Cardiac Enzymes:  Recent Labs Lab 05/05/15 0805 05/05/15 1836  TROPONINI <0.03 0.10*   BNP (last 3 results)  Recent Labs  07/21/14 1821 05/05/15 0556  BNP 39.7 74.3    ProBNP (last 3 results) No results for input(s): PROBNP in the last 8760 hours.  CBG: No results for input(s): GLUCAP in the last 168 hours.  Micro Recent Results (from the past 240 hour(s))  MRSA PCR Screening     Status: None   Collection Time: 05/05/15  9:35 AM  Result Value Ref Range Status   MRSA by PCR NEGATIVE NEGATIVE Final    Comment:        The GeneXpert MRSA Assay (FDA approved for NASAL specimens only), is one component of a comprehensive MRSA colonization surveillance program. It is not intended to diagnose MRSA infection nor to guide or monitor treatment for MRSA infections.   Body fluid culture     Status: None (Preliminary result)   Collection Time: 05/05/15  6:13 PM  Result Value Ref Range Status   Specimen Description PLEURAL RIGHT  Final   Special Requests NONE  Final   Gram Stain   Final    MODERATE WBC PRESENT, PREDOMINANTLY MONONUCLEAR NO ORGANISMS SEEN Performed at [facility]    Culture PENDING  Incomplete   Report Status PENDING  Incomplete     Studies: Dg Chest 1 View  05/05/2015  CLINICAL DATA:  Post right thoracentesis. EXAM: CHEST 1 VIEW COMPARISON:  05/05/2015 FINDINGS: Persistent right pleural effusion with probable consolidation in the right lung base causing tenting of the hemidiaphragm. Mild improvement of aeration in the right lung. Prominence of the right hilum with volume loss in the right upper lung suggests obstructing mass lesion. No pneumothorax. Left lung appears clear and expanded with mild hyperinflation. Heart size and pulmonary vascularity are normal for technique. Calcification of the aorta. Cardiac pacemaker. Degenerative changes in the spine and shoulders.  IMPRESSION: Persistent right pleural effusion with mild improvement of aeration in the right lung post thoracentesis. No pneumothorax. The volume loss in the right upper lung with right hilar prominence suggesting obstructing mass lesion. Electronically Signed   By: Lucienne Capers M.D.   On: 05/05/2015 18:29   Dg Chest 2 View  05/05/2015  CLINICAL DATA:  Acute onset of worsening shortness of breath and generalized weakness. Initial encounter. EXAM: CHEST  2 VIEW COMPARISON:  Chest radiograph performed 07/23/2014, and CT of the chest performed 03/27/2015 FINDINGS: A relatively large right-sided pleural effusion is noted, with underlying airspace opacification. Mild vascular congestion is noted. The left lung is grossly clear. No pneumothorax is seen. The cardiomediastinal silhouette is borderline normal in size. A pacemaker is noted at the left chest wall, with leads ending at the right atrium and right ventricle. No acute osseous abnormalities are seen. IMPRESSION: Increasing relatively large right-sided pleural effusion, with underlying airspace opacification. Mild vascular congestion noted. This is concerning for a malignant effusion, given the  patient's known lung cancer. Underlying pneumonia cannot be entirely excluded, depending on the patient's symptoms. Electronically Signed   By: Garald Balding M.D.   On: 05/05/2015 06:36   Ct Angio Chest Pe W/cm &/or Wo Cm  05/05/2015  CLINICAL DATA:  Shortness of breath. Pleural effusion. History pulmonary embolism. EXAM: CT ANGIOGRAPHY CHEST WITH CONTRAST TECHNIQUE: Multidetector CT imaging of the chest was performed using the standard protocol during bolus administration of intravenous contrast. Multiplanar CT image reconstructions and MIPs were obtained to evaluate the vascular anatomy. CONTRAST:  117m OMNIPAQUE IOHEXOL 350 MG/ML SOLN COMPARISON:  03/27/2015 FINDINGS: THORACIC INLET/BODY WALL: Body wall edema and extensive venous collaterals, both intra and  extra thoracic, secondary to chronic SVC obstruction. No axillary or supraclavicular adenopathy detected. MEDIASTINUM: No cardiomegaly or pericardial effusion. Dual-chamber pacer from the right. Suboptimal opacification of the pulmonary arterial tree due to SVC obstruction with bolus dispersion and intermittent motion. Right-sided pulmonary arteries are distorted and attenuated without central acute filling defect. Previous pulmonary embolism to the right lower lobe, with peripheral thick walled appearance compatible with endothelialized clot. No convincing pulmonary embolism, limited in subsegmental arteries. No acute aortic finding. No progressive mediastinal adenopathy. LUNG WINDOWS: Extensive atelectasis and scarring around the right hilum with bronchial distortion. There is a progressive and large layering right pleural effusion without pleural nodularity or thickening. No superimposed consolidation or edema. UPPER ABDOMEN: Stable bilateral adrenal nodularity, presumed metastases. No acute finding. OSSEOUS: No acute finding. Review of the MIP images confirms the above findings. IMPRESSION: 1. No convincing acute pulmonary embolism. 2. Partial re- cannulization of recent right lower lobe pulmonary embolism. 3. Chronic SVC obstruction which limits CTA. 4. Large and progressive layering right pleural effusion. Electronically Signed   By: JMonte FantasiaM.D.   On: 05/05/2015 08:42   Dg Abd 2 Views  05/05/2015  CLINICAL DATA:  Acute onset of mid abdominal pain and constipation. Initial encounter. EXAM: ABDOMEN - 2 VIEW COMPARISON:  CT of the abdomen and pelvis performed 03/27/2015 FINDINGS: The visualized bowel gas pattern is unremarkable. Scattered air and stool filled loops of colon are seen; no abnormal dilatation of small bowel loops is seen to suggest small bowel obstruction. No free intra-abdominal air is identified, though evaluation for free air is limited on a single supine view. The visualized osseous  structures are within normal limits; the sacroiliac joints are unremarkable in appearance. A large right-sided pleural effusion is partially imaged. Pacemaker leads are partially seen. An IVC filter is noted. Brachytherapy seeds are seen overlying the prostate bed. IMPRESSION: Unremarkable bowel gas pattern; no free intra-abdominal air seen. Small to moderate amount of stool noted in the colon. Electronically Signed   By: JGarald BaldingM.D.   On: 05/05/2015 06:38   UKoreaThoracentesis Asp Pleural Space W/img Guide  05/05/2015  CLINICAL DATA:  79year old male with a history of new right-sided pleural effusion. He has been referred for diagnostic and therapeutic thoracentesis. EXAM: ULTRASOUND GUIDED right THORACENTESIS COMPARISON:  CT 05/05/2015 PROCEDURE: An ultrasound guided thoracentesis was thoroughly discussed with the patient and questions answered. The benefits, risks, alternatives and complications were also discussed. The patient understands and wishes to proceed with the procedure. Written consent was obtained. Ultrasound was performed to localize and mark an adequate pocket of fluid in the right chest. The area was then prepped and draped in the normal sterile fashion. 1% Lidocaine was used for local anesthesia. Under ultrasound guidance a Safe-T-Centesis catheter was introduced. Thoracentesis was performed. The catheter was removed  and a dressing applied. Patient tolerated the procedure well and remained hemodynamically stable throughout. No complications encountered. COMPLICATIONS: None FINDINGS: A total of approximately 650 cc of serosanguineous fluid was removed. A fluid sample wassent for laboratory analysis. IMPRESSION: Status post right-sided thoracentesis.  650 cc of fluid removed. Sample was sent to the lab. Signed, Dulcy Fanny. Earleen Newport, DO [facility] Electronically Signed   By: Corrie Mckusick D.O.   On: 05/05/2015 18:26    Scheduled  Meds: . antiseptic oral rinse  7 mL Mouth Rinse q12n4p  . budesonide-formoterol  2 puff Inhalation BID  . cefTAZidime (FORTAZ)  IV  2 g Intravenous 3 times per day  . chlorhexidine  15 mL Mouth Rinse BID  . famotidine  20 mg Oral QHS  . fluticasone  1 spray Each Nare Daily  . gabapentin  300 mg Oral TID  . guaiFENesin  600 mg Oral BID  . methylPREDNISolone (SOLU-MEDROL) injection  40 mg Intravenous 3 times per day  . mirtazapine  30 mg Oral QHS  . pantoprazole  40 mg Oral Daily  . rivaroxaban  20 mg Oral Q supper  . sodium chloride  3 mL Intravenous Q12H  . sodium chloride  3 mL Intravenous Q12H  . tamsulosin  0.4 mg Oral QHS  . vancomycin  1,000 mg Intravenous Q12H   Continuous Infusions:      Time spent: 35 minutes    [facility]  [facility] Pager [phone] If 7PM-7AM, please contact night-coverage at [email or web address], password [facility] 05/06/2015, 10:27 AM  LOS: 1 day

## 2015-05-06 NOTE — Telephone Encounter (Signed)
June called states Nicholas Schmitt does not want hospice, he wants rehab. The family has mentioned this and the doctor came up to speak with him this morning. I think he was confused about what hospice is. He's not ready for hospice. He still wants treatment. Discussed with June the patient has the right to refuse hospice. May be beneficial to have a Education officer, museum speak with pt and family to help facilitate and discuss options. June advised pt is currently in hospital and has some " heart stuff" going on. She voiced her concerns and asked for this message to be passed to MD to clarify pt does not want hospice. No further concerns. Message forward to MD.

## 2015-05-06 NOTE — Progress Notes (Signed)
Initial Nutrition Assessment  DOCUMENTATION CODES:   Not applicable  INTERVENTION:   Diet advancement per MD  Provide Ensure, Chocolate, when applicable   NUTRITION DIAGNOSIS:   Increased nutrient needs related to catabolic illness, cancer and cancer related treatments as evidenced by estimated needs.  GOAL:   Patient will meet greater than or equal to 90% of their needs  MONITOR:   Diet advancement, Labs, I & O's, Skin  REASON FOR ASSESSMENT:   Malnutrition Screening Tool    ASSESSMENT:   79 year old male with a history of coronary artery disease, COPD Gold stage II, AAA, chronic kidney disease stage II, non-small cell lung cancer stage IV, status post craniotomy with tumor excision of brain metastasis on 12/04/14, recent history of pulmonary embolism, now on Xarelto and status post IVC filter placement on 80/22 without complications, who presents to the ER today with shortness of breath, no history of chest pain, or hemoptysis. Patient also complains of coughing spells with a lot of mucus production. Patient is status post chemotherapy with carboplatin and Alimita, every 3 weeks, last dose 05/20/14, currently receiving palliative radiotherapy to the right lung in the lumbar spinal metastasis under the care of Dr. Sondra Come.  Spoke with pt and son at bedside. Pt reports a fairly good appetite, 6-10# wt loss in past 2 months. Per chart review pt has had 16#/9% severe wt loss in 2 months. However, physical exam revealed mild depletions of body fat, but overall muscle mass has been maintained.  Pt is currently undergoing pallative radiotherapy to the right lung in the lumbar spinal metastasis; receiving immunotherapy last dose was 04/21/15; s/p stereotactic radiosurgery for spinal metastasis on 04/16/15;  Pt seems ok nutrition wise, will provide ensure enlive when diet is advanced. Pt drinks ensure at home.  Diet Order:  Diet NPO time specified  Skin:  Reviewed, no  issues  Last BM:  05/02/2015  Height:   Ht Readings from Last 1 Encounters:  05/05/15 6' (1.829 m)    Weight:   Wt Readings from Last 1 Encounters:  05/05/15 161 lb 9.6 oz (73.3 kg)    Ideal Body Weight:  80.9 kg  BMI:  Body mass index is 21.91 kg/(m^2).  Estimated Nutritional Needs:   Kcal:  2200 - 2400 calories  Protein:  88 - 110 grams  Fluid:  >/= 2L  EDUCATION NEEDS:   Education needs addressed  Satira Anis. Lucilia Yanni, MS, RD LDN After Hours/Weekend Pager [phone]

## 2015-05-06 NOTE — CV Procedure (Signed)
TEE Normal LVEF. 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 Doubt thrombus or vegetation. Moderate atheromatous changes of the thoracic aorta. Trace MR, TR and AI

## 2015-05-06 NOTE — Progress Notes (Signed)
DIAGNOSIS: Non-small cell carcinoma of lung, stage 4  Primary site: Lung (Right)  Staging method: AJCC 7th Edition  Clinical: Stage IV (T1a, N3, M1b) signed by Curt Bears, MD on 02/01/2014 1:42 PM  Summary: Stage IV (T1a, N3, M1b) Prostate cancer  Primary site: Prostate  Clinical: Stage I (T1c, NX, M0) signed by Wyatt Portela, MD on 08/06/2013 1:59 PM  Summary: Stage I (T1c, NX, M0)  PRIOR THERAPY:  1) Systemic chemotherapy with carboplatin for an AUC of 5 and Alimta 500 mg per meter squared given every 3 weeks. Status post 6 cycles, last dose was given 05/20/2014 discontinued secondary to disease progression. 2) palliative radiotherapy to the right lung and lumbar spines under the care of Dr. Sondra Come. 3) status post craniotomy with tumor excision under the care of Dr. Vertell Limber on 12/04/2014.  CURRENT THERAPY:  1) Immunotherapy with Ketruda (pembrolizumab) 2 MG/KG every 3 weeks. First dose 07/29/2014. He has positive PDL 1 expression (90%). Status post 12 cycles. 2) Xarelto 20 mg by mouth daily for deep venous thrombosis and pulmonary embolism.  Subjective: The patient is seen and examined today. He was admitted to [facility] complaining of increasing shortness of breath in addition to cough productive of yellowish mucus. He had CT angiogram of the chest performed yesterday and it showed no evidence for acute pulmonary embolism. There was chronic SVC obstruction and large as well as progressive lowering of right pleural effusion. The patient underwent ultrasound-guided right thoracentesis with drainage of 650 ML pleural fluid. He is feeling little bit better today but continues to have shortness breath with exertion. He had 2-D echo yesterday that showed abnormal left ventricular relaxation as well as large vegetation of the tricuspid valve and he is scheduled for TEE today. He has no fever or chills. No nausea or vomiting.  Objective: Vital signs in last 24 hours: Temp:   [97.2 F (36.2 C)-98.1 F (36.7 C)] 97.7 F (36.5 C) (11/23 0800) Pulse Rate:  [67-111] 111 (11/23 0700) Resp:  [9-31] 23 (11/23 0700) BP: (79-136)/(46-78) 118/63 mmHg (11/23 0700) SpO2:  [96 %-100 %] 98 % (11/23 0700) Weight:  [161 lb 9.6 oz (73.3 kg)] 161 lb 9.6 oz (73.3 kg) (11/22 1100)  Intake/Output from previous day: 11/22 0701 - 11/23 0700 In: 5732 [P.O.:240; I.V.:123; IV Piggyback:1150] Out: 2550 [Urine:2550] Intake/Output this shift:    General appearance: alert, cooperative, fatigued and no distress Resp: diminished breath sounds RLL and wheezes bilaterally Cardio: regular rate and rhythm, S1, S2 normal, no murmur, click, rub or gallop GI: soft, non-tender; bowel sounds normal; no masses,  no organomegaly Extremities: extremities normal, atraumatic, no cyanosis or edema  Lab Results:   Recent Labs  05/05/15 0545 05/06/15 0325  WBC 3.1* 5.2  HGB 8.3* 8.7*  HCT 28.5* 29.2*  PLT 143* 164   BMET  Recent Labs  05/05/15 0545 05/06/15 0325  NA 141 139  K 3.8 4.2  CL 106 103  CO2 26 27  GLUCOSE 95 160*  BUN 13 14  CREATININE 0.64 0.81  CALCIUM 9.2 9.2    Studies/Results: Dg Chest 1 View  05/05/2015  CLINICAL DATA:  Post right thoracentesis. EXAM: CHEST 1 VIEW COMPARISON:  05/05/2015 FINDINGS: Persistent right pleural effusion with probable consolidation in the right lung base causing tenting of the hemidiaphragm. Mild improvement of aeration in the right lung. Prominence of the right hilum with volume loss in the right upper lung suggests obstructing mass lesion. No pneumothorax. Left lung appears clear and  expanded with mild hyperinflation. Heart size and pulmonary vascularity are normal for technique. Calcification of the aorta. Cardiac pacemaker. Degenerative changes in the spine and shoulders. IMPRESSION: Persistent right pleural effusion with mild improvement of aeration in the right lung post thoracentesis. No pneumothorax. The volume loss in the right  upper lung with right hilar prominence suggesting obstructing mass lesion. Electronically Signed   By: Lucienne Capers M.D.   On: 05/05/2015 18:29   Dg Chest 2 View  05/05/2015  CLINICAL DATA:  Acute onset of worsening shortness of breath and generalized weakness. Initial encounter. EXAM: CHEST  2 VIEW COMPARISON:  Chest radiograph performed 07/23/2014, and CT of the chest performed 03/27/2015 FINDINGS: A relatively large right-sided pleural effusion is noted, with underlying airspace opacification. Mild vascular congestion is noted. The left lung is grossly clear. No pneumothorax is seen. The cardiomediastinal silhouette is borderline normal in size. A pacemaker is noted at the left chest wall, with leads ending at the right atrium and right ventricle. No acute osseous abnormalities are seen. IMPRESSION: Increasing relatively large right-sided pleural effusion, with underlying airspace opacification. Mild vascular congestion noted. This is concerning for a malignant effusion, given the patient's known lung cancer. Underlying pneumonia cannot be entirely excluded, depending on the patient's symptoms. Electronically Signed   By: Garald Balding M.D.   On: 05/05/2015 06:36   Ct Angio Chest Pe W/cm &/or Wo Cm  05/05/2015  CLINICAL DATA:  Shortness of breath. Pleural effusion. History pulmonary embolism. EXAM: CT ANGIOGRAPHY CHEST WITH CONTRAST TECHNIQUE: Multidetector CT imaging of the chest was performed using the standard protocol during bolus administration of intravenous contrast. Multiplanar CT image reconstructions and MIPs were obtained to evaluate the vascular anatomy. CONTRAST:  151m OMNIPAQUE IOHEXOL 350 MG/ML SOLN COMPARISON:  03/27/2015 FINDINGS: THORACIC INLET/BODY WALL: Body wall edema and extensive venous collaterals, both intra and extra thoracic, secondary to chronic SVC obstruction. No axillary or supraclavicular adenopathy detected. MEDIASTINUM: No cardiomegaly or pericardial effusion.  Dual-chamber pacer from the right. Suboptimal opacification of the pulmonary arterial tree due to SVC obstruction with bolus dispersion and intermittent motion. Right-sided pulmonary arteries are distorted and attenuated without central acute filling defect. Previous pulmonary embolism to the right lower lobe, with peripheral thick walled appearance compatible with endothelialized clot. No convincing pulmonary embolism, limited in subsegmental arteries. No acute aortic finding. No progressive mediastinal adenopathy. LUNG WINDOWS: Extensive atelectasis and scarring around the right hilum with bronchial distortion. There is a progressive and large layering right pleural effusion without pleural nodularity or thickening. No superimposed consolidation or edema. UPPER ABDOMEN: Stable bilateral adrenal nodularity, presumed metastases. No acute finding. OSSEOUS: No acute finding. Review of the MIP images confirms the above findings. IMPRESSION: 1. No convincing acute pulmonary embolism. 2. Partial re- cannulization of recent right lower lobe pulmonary embolism. 3. Chronic SVC obstruction which limits CTA. 4. Large and progressive layering right pleural effusion. Electronically Signed   By: JMonte FantasiaM.D.   On: 05/05/2015 08:42   Dg Abd 2 Views  05/05/2015  CLINICAL DATA:  Acute onset of mid abdominal pain and constipation. Initial encounter. EXAM: ABDOMEN - 2 VIEW COMPARISON:  CT of the abdomen and pelvis performed 03/27/2015 FINDINGS: The visualized bowel gas pattern is unremarkable. Scattered air and stool filled loops of colon are seen; no abnormal dilatation of small bowel loops is seen to suggest small bowel obstruction. No free intra-abdominal air is identified, though evaluation for free air is limited on a single supine view. The visualized osseous structures  are within normal limits; the sacroiliac joints are unremarkable in appearance. A large right-sided pleural effusion is partially imaged. Pacemaker  leads are partially seen. An IVC filter is noted. Brachytherapy seeds are seen overlying the prostate bed. IMPRESSION: Unremarkable bowel gas pattern; no free intra-abdominal air seen. Small to moderate amount of stool noted in the colon. Electronically Signed   By: Garald Balding M.D.   On: 05/05/2015 06:38   US Thoracentesis Asp Pleural Space W/img Guide  05/05/2015  CLINICAL DATA:  79 year old male with a history of new right-sided pleural effusion. He has been referred for diagnostic and therapeutic thoracentesis. EXAM: ULTRASOUND GUIDED right THORACENTESIS COMPARISON:  CT 05/05/2015 PROCEDURE: An ultrasound guided thoracentesis was thoroughly discussed with the patient and questions answered. The benefits, risks, alternatives and complications were also discussed. The patient understands and wishes to proceed with the procedure. Written consent was obtained. Ultrasound was performed to localize and mark an adequate pocket of fluid in the right chest. The area was then prepped and draped in the normal sterile fashion. 1% Lidocaine was used for local anesthesia. Under ultrasound guidance a Safe-T-Centesis catheter was introduced. Thoracentesis was performed. The catheter was removed and a dressing applied. Patient tolerated the procedure well and remained hemodynamically stable throughout. No complications encountered. COMPLICATIONS: None FINDINGS: A total of approximately 650 cc of serosanguineous fluid was removed. A fluid sample wassent for laboratory analysis. IMPRESSION: Status post right-sided thoracentesis.  650 cc of fluid removed. Sample was sent to the lab. Signed, Dulcy Fanny. Earleen [place], DO [facility] Electronically Signed   By: Corrie Mckusick D.O.   On: 05/05/2015 18:26    Medications: I have reviewed the patient's current medications.  CODE STATUS: no CODE BLUE.  Assessment/Plan: 1) metastatic non-small cell lung cancer, adenocarcinoma  currently undergoing treatment with immunotherapy with [place] status post 12 cycles and has been tolerating his treatment fairly well. I had a lengthy discussion with the patient today about his condition and treatment options. The recent CT scan of the chest showed no evidence for disease progression. I agree with the patient the option of continuous treatment with immunotherapy versus consideration of palliative care and hospice referral. The patient declined hospice service at this point. He would like to continue treatment in the future after discharge from the hospital. 2) history of pulmonary embolism: Currently on Xarelto and tolerating it well with no significant bleeding issues. 3) right pleural effusion status post right-sided ultrasound-guided thoracentesis 4) vegetation of the tricuspid valve: The patient is a scheduled for TEE later today.  5) CODE STATUS: I discussed the CODE STATUS with the patient today and he indicated no CODE BLUE. Thank you so much for taking good care of Mr. Chrissie Noa, I will continue to follow up the patient with you and assist in his management on as-needed basis.  LOS: 1 day    Khale Nigh K. 05/06/2015

## 2015-05-07 DIAGNOSIS — J449 Chronic obstructive pulmonary disease, unspecified: Secondary | ICD-10-CM

## 2015-05-07 DIAGNOSIS — I2699 Other pulmonary embolism without acute cor pulmonale: Secondary | ICD-10-CM

## 2015-05-07 DIAGNOSIS — D630 Anemia in neoplastic disease: Secondary | ICD-10-CM

## 2015-05-07 DIAGNOSIS — J90 Pleural effusion, not elsewhere classified: Secondary | ICD-10-CM | POA: Diagnosis present

## 2015-05-07 LAB — BASIC METABOLIC PANEL
ANION GAP: 8 (ref 5–15)
BUN: 16 mg/dL (ref 6–20)
CO2: 29 mmol/L (ref 22–32)
Calcium: 9.3 mg/dL (ref 8.9–10.3)
Chloride: 104 mmol/L (ref 101–111)
Creatinine, Ser: 0.68 mg/dL (ref 0.61–1.24)
GFR calc Af Amer: >60 mL/min (ref >60)
GLUCOSE: 113 mg/dL — AB (ref 65–99)
POTASSIUM: 4.4 mmol/L (ref 3.5–5.1)
Sodium: 141 mmol/L (ref 135–145)

## 2015-05-07 LAB — PH, BODY FLUID: PH, BODY FLUID: 7.8

## 2015-05-07 LAB — MAGNESIUM: Magnesium: 2 mg/dL (ref 1.7–2.4)

## 2015-05-07 NOTE — Progress Notes (Signed)
RN called by CMT. Patient had a 13 beat run of VT. Dr. Einar Gip paged. Orders received to obtain BMP and Magnesium level this am.

## 2015-05-07 NOTE — Progress Notes (Signed)
[facility] PROGRESS NOTE   Nicholas Schmitt VVO:[id number] DOB: 06-08-1936 DOA: 05/05/2015 PCP: [name], MD  HPI/Subjective: Seen with his friend June at bedside. Off of the oxygen, TEE showed mobile mass not clot or bacterial vegetation. He asked about SNF, PT/OT to evaluate  Assessment/Plan: Active Problems:   HLD (hyperlipidemia)   Coronary atherosclerosis   Abdominal aortic aneurysm (HCC)   COPD GOLD II   Non-small cell carcinoma of lung, stage 4 (HCC)   Dyspnea   Anemia in neoplastic disease   SOB (shortness of breath)   DVT (deep venous thrombosis) (HCC)   Acute pulmonary embolism (HCC)   Right-sided pleural effusion, large, likely malignant Presented the hospital with shortness of breath, interventional radiology consulted. Right-sided US guided paracentesis done yesterday with removal of 650 mL of serosanguineous fluids. Patient has been on Xarelto since October for his acute pulmonary embolism  Tricuspid valve mobile mass, asymptomatic Echo done today shows large tricuspid vegetation, Dr Einar Gip consulted. Started initially on antibiotics empirically to cover for endocarditis. Per Dr. Irven Shelling note, vegetation does not look bacterial or blood clot. I will discontinue antibiotics. Findings consistent with myxoma, this lesion was not visible on 2-D echo from February 2016. We'll discuss with cardiology/oncology the next step, please note that patient has stage IV NSCLC.  Non-small cell carcinoma of lung, stage 4 Oncology will continue to follow the patient during this admission  HLD (hyperlipidemia)- stable Currently on no treatment  Coronary atherosclerosis- 2-D echo shows LV EF: 55% -  60%, Wall motion was normal;no chest pain,   Abdominal aortic aneurysm stable  COPD GOLD II, with acute COPD exacerbation We'll start the patient on empiric steroids, antibiotics as the patient is immunocompromised as is currently undergoing chemotherapy,  repeat chest x-ray tomorrow Patient currently on Decadron which we can hold in the setting of administering IV Solu-Medrol Continue nebulizer treatments  Chronic back pain Continue gabapentin, steroids, patient states that his ambulation has improved since his spinal surgery 2 weeks ago, does not complain of any focal deficits, no recent falls  Anemia in neoplastic disease Baseline hemoglobin around 8.5-9.0, hemoglobin at baseline  Recent Acute pulmonary embolism Continues Xarelto  Nonsustained VT Had 13 beats of VT earlier today. Potassium and magnesium okay, continue beta blockers.  Code Status: DNR Family Communication: Plan discussed with the patient. Disposition Plan: Remains inpatient, transfer to telemetry. Diet: Diet regular Room service appropriate?: Yes; Fluid consistency:: Thin  Consultants:  Oncology.  ID  Procedures:  None  Antibiotics:  Ceftaz and vancomycin was started on 11/22   Objective: Filed Vitals:   05/06/15 2059 05/07/15 0447  BP: 97/50 132/68  Pulse: 77 73  Temp: 98.2 F (36.8 C) 97.5 F (36.4 C)  Resp: 18 18    Intake/Output Summary (Last 24 hours) at 05/07/15 1225 Last data filed at 05/07/15 0800  Gross per 24 hour  Intake   1340 ml  Output    700 ml  Net    640 ml   Filed Weights   05/05/15 1100  Weight: 73.3 kg (161 lb 9.6 oz)    Exam: General: Alert and awake, oriented x3, not in any acute distress. HEENT: anicteric sclera, pupils reactive to light and accommodation, EOMI CVS: S1-S2 clear, no murmur rubs or gallops Chest: clear to auscultation bilaterally, no wheezing, rales or rhonchi Abdomen: soft nontender, nondistended, normal bowel sounds, no organomegaly Extremities: no cyanosis, clubbing or edema noted bilaterally Neuro: Cranial nerves II-XII intact, no focal neurological deficits  Data Reviewed:  Basic Metabolic Panel:  Recent Labs Lab 05/05/15 0545 05/05/15 0805 05/06/15 0325 05/07/15 0535  NA 141  --   139 141  K 3.8  --  4.2 4.4  CL 106  --  103 104  CO2 26  --  27 29  GLUCOSE 95  --  160* 113*  BUN 13  --  14 16  CREATININE 0.64  --  0.81 0.68  CALCIUM 9.2  --  9.2 9.3  MG  --  1.7  --  2.0   Liver Function Tests:  Recent Labs Lab 05/05/15 0545 05/06/15 0325  AST 13* 18  ALT 11* 11*  ALKPHOS 81 81  BILITOT 0.7 0.7  PROT 6.5 6.5  ALBUMIN 3.0* 3.0*   No results for input(s): LIPASE, AMYLASE in the last 168 hours. No results for input(s): AMMONIA in the last 168 hours. CBC:  Recent Labs Lab 05/05/15 0545 05/06/15 0325  WBC 3.1* 5.2  NEUTROABS 2.1  --   HGB 8.3* 8.7*  HCT 28.5* 29.2*  MCV 87.4 88.2  PLT 143* 164   Cardiac Enzymes:  Recent Labs Lab 05/05/15 0805 05/05/15 1836  TROPONINI <0.03 0.10*   BNP (last 3 results)  Recent Labs  07/21/14 1821 05/05/15 0556  BNP 39.7 74.3    ProBNP (last 3 results) No results for input(s): PROBNP in the last 8760 hours.  CBG: No results for input(s): GLUCAP in the last 168 hours.  Micro Recent Results (from the past 240 hour(s))  MRSA PCR Screening     Status: None   Collection Time: 05/05/15  9:35 AM  Result Value Ref Range Status   MRSA by PCR NEGATIVE NEGATIVE Final    Comment:        The GeneXpert MRSA Assay (FDA approved for NASAL specimens only), is one component of a comprehensive MRSA colonization surveillance program. It is not intended to diagnose MRSA infection nor to guide or monitor treatment for MRSA infections.   Body fluid culture     Status: None (Preliminary result)   Collection Time: 05/05/15  6:13 PM  Result Value Ref Range Status   Specimen Description PLEURAL RIGHT  Final   Special Requests NONE  Final   Gram Stain   Final    MODERATE WBC PRESENT, PREDOMINANTLY MONONUCLEAR NO ORGANISMS SEEN    Culture   Final    NO GROWTH 2 DAYS Performed at [facility]    Report Status PENDING  Incomplete  Culture, blood (routine x 2)     Status: None (Preliminary result)    Collection Time: 05/05/15  6:36 PM  Result Value Ref Range Status   Specimen Description BLOOD RIGHT ANTECUBITAL  Final   Special Requests BOTTLES DRAWN AEROBIC AND ANAEROBIC 3CC  Final   Culture   Final    NO GROWTH 2 DAYS Performed at [facility]    Report Status PENDING  Incomplete  Culture, blood (routine x 2)     Status: None (Preliminary result)   Collection Time: 05/05/15  6:44 PM  Result Value Ref Range Status   Specimen Description BLOOD RIGHT HAND  Final   Special Requests BOTTLES DRAWN AEROBIC AND ANAEROBIC 2CC  Final   Culture   Final    NO GROWTH 2 DAYS Performed at [facility]    Report Status PENDING  Incomplete     Studies: Dg Chest 1 View  05/05/2015  CLINICAL DATA:  Post right thoracentesis. EXAM: CHEST 1 VIEW COMPARISON:  05/05/2015  FINDINGS: Persistent right pleural effusion with probable consolidation in the right lung base causing tenting of the hemidiaphragm. Mild improvement of aeration in the right lung. Prominence of the right hilum with volume loss in the right upper lung suggests obstructing mass lesion. No pneumothorax. Left lung appears clear and expanded with mild hyperinflation. Heart size and pulmonary vascularity are normal for technique. Calcification of the aorta. Cardiac pacemaker. Degenerative changes in the spine and shoulders. IMPRESSION: Persistent right pleural effusion with mild improvement of aeration in the right lung post thoracentesis. No pneumothorax. The volume loss in the right upper lung with right hilar prominence suggesting obstructing mass lesion. Electronically Signed   By: Lucienne Capers M.D.   On: 05/05/2015 18:29   US Thoracentesis Asp Pleural Space W/img Guide  05/05/2015  CLINICAL DATA:  79 year old male with a history of new right-sided pleural effusion. He has been referred for diagnostic and therapeutic thoracentesis. EXAM: ULTRASOUND GUIDED right THORACENTESIS COMPARISON:  CT 05/05/2015 PROCEDURE: An  ultrasound guided thoracentesis was thoroughly discussed with the patient and questions answered. The benefits, risks, alternatives and complications were also discussed. The patient understands and wishes to proceed with the procedure. Written consent was obtained. Ultrasound was performed to localize and mark an adequate pocket of fluid in the right chest. The area was then prepped and draped in the normal sterile fashion. 1% Lidocaine was used for local anesthesia. Under ultrasound guidance a Safe-T-Centesis catheter was introduced. Thoracentesis was performed. The catheter was removed and a dressing applied. Patient tolerated the procedure well and remained hemodynamically stable throughout. No complications encountered. COMPLICATIONS: None FINDINGS: A total of approximately 650 cc of serosanguineous fluid was removed. A fluid sample wassent for laboratory analysis. IMPRESSION: Status post right-sided thoracentesis.  650 cc of fluid removed. Sample was sent to the lab. Signed, Dulcy Fanny. Earleen [place], DO [facility] Electronically Signed   By: Corrie Mckusick D.O.   On: 05/05/2015 18:26    Scheduled Meds: . antiseptic oral rinse  7 mL Mouth Rinse q12n4p  . budesonide-formoterol  2 puff Inhalation BID  . cefTAZidime (FORTAZ)  IV  2 g Intravenous 3 times per day  . chlorhexidine  15 mL Mouth Rinse BID  . famotidine  20 mg Oral QHS  . feeding supplement (ENSURE ENLIVE)  237 mL Oral BID BM  . fluticasone  1 spray Each Nare Daily  . gabapentin  300 mg Oral TID  . guaiFENesin  600 mg Oral BID  . methylPREDNISolone (SOLU-MEDROL) injection  40 mg Intravenous 3 times per day  . mirtazapine  30 mg Oral QHS  . pantoprazole  40 mg Oral Daily  . rivaroxaban  20 mg Oral Q supper  . sodium chloride  3 mL Intravenous Q12H  . sodium chloride  3 mL Intravenous Q12H  . tamsulosin  0.4 mg Oral QHS  . vancomycin  1,000 mg Intravenous Q12H   Continuous Infusions:        Time spent: 35 minutes    [facility]  [facility] Pager [phone] If 7PM-7AM, please contact night-coverage at [email or web address], password [facility] 05/07/2015, 12:25 PM  LOS: 2 days

## 2015-05-08 ENCOUNTER — Telehealth: Payer: Self-pay | Admitting: Medical Oncology

## 2015-05-08 ENCOUNTER — Encounter (HOSPITAL_COMMUNITY): Payer: Self-pay | Admitting: Cardiology

## 2015-05-08 DIAGNOSIS — R0600 Dyspnea, unspecified: Secondary | ICD-10-CM

## 2015-05-08 DIAGNOSIS — E785 Hyperlipidemia, unspecified: Secondary | ICD-10-CM

## 2015-05-08 NOTE — Care Management Note (Signed)
Case Management Note  Patient Details  Name: BENTZION DAURIA MRN: [id number] Date of Birth: 04/02/1936  Subjective/Objective:                    Action/Plan:   Expected Discharge Date:   (unknown)               Expected Discharge Plan:  Home/Self Care  In-House Referral:  NA  Discharge planning Services  CM Consult  Post Acute Care Choice:  NA Choice offered to:  NA  DME Arranged:    DME Agency:     HH Arranged:    [place] Agency:     Status of Service:  In process, will continue to follow  Medicare Important Message Given:    Date Medicare IM Given:    Medicare IM give by:    Date Additional Medicare IM Given:    Additional Medicare Important Message give by:     If discussed at [place] of Stay Meetings, dates discussed:    Additional Comments:  Dessa Phi, RN 05/08/2015, 3:58 PM

## 2015-05-08 NOTE — Progress Notes (Signed)
[facility] PROGRESS NOTE   VERDELL DYKMAN GQQ:[id number] DOB: [birth year] DOA: 05/05/2015 PCP: [name], MD  HPI/Subjective: Denies any symptoms this morning, feels weak but overall better than the day of admission.  Assessment/Plan: Active Problems:   HLD (hyperlipidemia)   Coronary atherosclerosis   Abdominal aortic aneurysm (HCC)   Non-small cell carcinoma of lung, stage 4 (HCC)   Dyspnea   Anemia in neoplastic disease   SOB (shortness of breath)   DVT (deep venous thrombosis) (HCC)   Acute pulmonary embolism (HCC)   Pleural effusion, right   Right-sided pleural effusion, large, likely malignant Presented the hospital with shortness of breath, interventional radiology consulted. Right-sided US guided paracentesis done yesterday with removal of 650 mL of serosanguineous fluids. Patient has been on Xarelto since October for his acute pulmonary embolism  Tricuspid valve mobile mass, asymptomatic Echo done today shows large tricuspid vegetation, Dr Einar Gip consulted. Started initially on antibiotics empirically to cover for endocarditis. Per Dr. Irven Shelling note, vegetation does not look bacterial or blood clot. I will discontinue antibiotics. Findings consistent with myxoma, this lesion was not visible on 2-D echo from February 2016. We'll discuss with cardiology/oncology the next step, please note that patient has stage IV NSCLC. Discussed with Dr. Marin Olp of oncology, and this is secondary to the cancer, Nat Math is a treatment. Patient is Xarelto.  Non-small cell carcinoma of lung, stage 4 Oncology will continue to follow the patient during this admission. Patient has metastases to his brain, the right-sided pleural effusion is likely malignant. Cytology pending.  HLD (hyperlipidemia)- stable Currently on no treatment  Coronary atherosclerosis- 2-D echo shows LV EF: 55% -  60%, Wall motion was normal;no chest pain,   Abdominal aortic  aneurysm stable  COPD GOLD II, with acute COPD exacerbation We'll start the patient on empiric steroids, antibiotics as the patient is immunocompromised as is currently undergoing chemotherapy, repeat chest x-ray tomorrow Patient currently on Decadron which we can hold in the setting of administering IV Solu-Medrol Continue nebulizer treatments  Chronic back pain Continue gabapentin, steroids, patient states that his ambulation has improved since his spinal surgery 2 weeks ago, does not complain of any focal deficits, no recent falls  Anemia in neoplastic disease Baseline hemoglobin around 8.5-9.0, hemoglobin at baseline  Recent Acute pulmonary embolism Continues Xarelto  Nonsustained VT Had 13 beats of VT earlier today. Potassium and magnesium okay, continue beta blockers.  Code Status: DNR Family Communication: Plan discussed with the patient. Disposition Plan: Remains inpatient, transfer to telemetry. Diet: Diet regular Room service appropriate?: Yes; Fluid consistency:: Thin  Consultants:  Oncology.  ID  Procedures:  None  Antibiotics:  Ceftaz and vancomycin was started on 11/22   Objective: Filed Vitals:   05/08/15 0534 05/08/15 1341  BP: 147/82 119/66  Pulse: 77 90  Temp: 97.8 F (36.6 C) 97.7 F (36.5 C)  Resp: 18 18    Intake/Output Summary (Last 24 hours) at 05/08/15 1351 Last data filed at 05/08/15 1343  Gross per 24 hour  Intake   1140 ml  Output    326 ml  Net    814 ml   Filed Weights   05/05/15 1100  Weight: 73.3 kg (161 lb 9.6 oz)    Exam: General: Alert and awake, oriented x3, not in any acute distress. HEENT: anicteric sclera, pupils reactive to light and accommodation, EOMI CVS: S1-S2 clear, no murmur rubs or gallops Chest: clear to auscultation bilaterally, no wheezing, rales or rhonchi Abdomen: soft nontender, nondistended, normal bowel  sounds, no organomegaly Extremities: no cyanosis, clubbing or edema noted  bilaterally Neuro: Cranial nerves II-XII intact, no focal neurological deficits  Data Reviewed: Basic Metabolic Panel:  Recent Labs Lab 05/05/15 0545 05/05/15 0805 05/06/15 0325 05/07/15 0535  NA 141  --  139 141  K 3.8  --  4.2 4.4  CL 106  --  103 104  CO2 26  --  27 29  GLUCOSE 95  --  160* 113*  BUN 13  --  14 16  CREATININE 0.64  --  0.81 0.68  CALCIUM 9.2  --  9.2 9.3  MG  --  1.7  --  2.0   Liver Function Tests:  Recent Labs Lab 05/05/15 0545 05/06/15 0325  AST 13* 18  ALT 11* 11*  ALKPHOS 81 81  BILITOT 0.7 0.7  PROT 6.5 6.5  ALBUMIN 3.0* 3.0*   No results for input(s): LIPASE, AMYLASE in the last 168 hours. No results for input(s): AMMONIA in the last 168 hours. CBC:  Recent Labs Lab 05/05/15 0545 05/06/15 0325  WBC 3.1* 5.2  NEUTROABS 2.1  --   HGB 8.3* 8.7*  HCT 28.5* 29.2*  MCV 87.4 88.2  PLT 143* 164   Cardiac Enzymes:  Recent Labs Lab 05/05/15 0805 05/05/15 1836  TROPONINI <0.03 0.10*   BNP (last 3 results)  Recent Labs  07/21/14 1821 05/05/15 0556  BNP 39.7 74.3    ProBNP (last 3 results) No results for input(s): PROBNP in the last 8760 hours.  CBG: No results for input(s): GLUCAP in the last 168 hours.  Micro Recent Results (from the past 240 hour(s))  MRSA PCR Screening     Status: None   Collection Time: 05/05/15  9:35 AM  Result Value Ref Range Status   MRSA by PCR NEGATIVE NEGATIVE Final    Comment:        The GeneXpert MRSA Assay (FDA approved for NASAL specimens only), is one component of a comprehensive MRSA colonization surveillance program. It is not intended to diagnose MRSA infection nor to guide or monitor treatment for MRSA infections.   Body fluid culture     Status: None (Preliminary result)   Collection Time: 05/05/15  6:13 PM  Result Value Ref Range Status   Specimen Description PLEURAL RIGHT  Final   Special Requests NONE  Final   Gram Stain   Final    MODERATE WBC PRESENT, PREDOMINANTLY  MONONUCLEAR NO ORGANISMS SEEN    Culture   Final    NO GROWTH 3 DAYS Performed at [facility]    Report Status PENDING  Incomplete  Culture, blood (routine x 2)     Status: None (Preliminary result)   Collection Time: 05/05/15  6:36 PM  Result Value Ref Range Status   Specimen Description BLOOD RIGHT ANTECUBITAL  Final   Special Requests BOTTLES DRAWN AEROBIC AND ANAEROBIC 3CC  Final   Culture   Final    NO GROWTH 2 DAYS Performed at [facility]    Report Status PENDING  Incomplete  Culture, blood (routine x 2)     Status: None (Preliminary result)   Collection Time: 05/05/15  6:44 PM  Result Value Ref Range Status   Specimen Description BLOOD RIGHT HAND  Final   Special Requests BOTTLES DRAWN AEROBIC AND ANAEROBIC 2CC  Final   Culture   Final    NO GROWTH 2 DAYS Performed at [facility]    Report Status PENDING  Incomplete  Studies: No results found.  Scheduled Meds: . antiseptic oral rinse  7 mL Mouth Rinse q12n4p  . budesonide-formoterol  2 puff Inhalation BID  . chlorhexidine  15 mL Mouth Rinse BID  . famotidine  20 mg Oral QHS  . feeding supplement (ENSURE ENLIVE)  237 mL Oral BID BM  . fluticasone  1 spray Each Nare Daily  . gabapentin  300 mg Oral TID  . guaiFENesin  600 mg Oral BID  . mirtazapine  30 mg Oral QHS  . pantoprazole  40 mg Oral Daily  . rivaroxaban  20 mg Oral Q supper  . sodium chloride  3 mL Intravenous Q12H  . sodium chloride  3 mL Intravenous Q12H  . tamsulosin  0.4 mg Oral QHS   Continuous Infusions:      Time spent: 35 minutes    [facility] A  [facility] Pager [phone] If 7PM-7AM, please contact night-coverage at [email or web address], password [facility] 05/08/2015, 1:51 PM  LOS: 3 days

## 2015-05-08 NOTE — Clinical Social Work Note (Signed)
CSW sent authorization request to [facility].  Awaiting auth.  Nonnie Done, LCSW [phone]  [facility] Licensed Clinical Social Worker

## 2015-05-08 NOTE — NC FL2 (Signed)
[place] LEVEL OF CARE SCREENING TOOL     IDENTIFICATION  Patient Name: Nicholas Schmitt Birthdate: 04-Sep-1935 Sex: male Admission Date (Current Location): 05/05/2015  [facility] and [place] Number: Herbalist and Address:  [facility] [place], [place] [address], [place], [place] [address]      Provider Number: [id number]  Attending Physician Name and Address:  [name], MD  Relative Name and Phone Number:       Current Level of Care: Hospital Recommended Level of Care: [place] Prior Approval Number:    Date Approved/Denied:   PASRR Number: [id number]  Discharge Plan: SNF    Current Diagnoses: Patient Active Problem List   Diagnosis Date Noted  . Pleural effusion, right 05/07/2015  . Acute pulmonary embolism ([place])   . 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 DVT (deep venous thrombosis) ([place]) 01/17/2015  . Sensation of cold in leg 01/06/2015  . Pain of left leg 01/06/2015  . Metastasis to brain ([place]) 12/04/2014  . Brain metastasis ([place]) 11/17/2014  . Encounter for antineoplastic immunotherapy 11/17/2014  . Other pancytopenia ([place])   . Acute respiratory failure with hypoxia ([place])   . Antineoplastic chemotherapy induced pancytopenia ([place])   . SOB (shortness of breath) 07/21/2014  . CAP (community acquired pneumonia) 07/21/2014  . Neoplasm related pain 06/18/2014  . Anemia in neoplastic disease 06/04/2014  . Dyspnea 06/02/2014  . Acute right hip pain 05/26/2014  . Non-small cell carcinoma of lung, stage 4 ([place]) 01/30/2014  . Nodule of right lung 01/21/2014  . Mediastinal adenopathy 01/21/2014  . Prostate cancer ([place])   . History of radiation therapy   . 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 Sick sinus syndrome ([place]) 04/16/2013  . Cardiac arrhythmia 07/12/2011  . COPD GOLD II 08/27/2010  . Abdominal aortic aneurysm ([place]) 05/14/2008  . ACTINIC KERATOSIS, HEAD  02/11/2008  . LOSS, CONDUCTIVE HEARING, BILATERAL 01/18/2007  . HLD (hyperlipidemia) 12/25/2006  . Essential hypertension 12/25/2006  . Coronary atherosclerosis 12/25/2006    Orientation ACTIVITIES/SOCIAL BLADDER RESPIRATION    Self, Time, Situation, Place    Continent    BEHAVIORAL SYMPTOMS/MOOD NEUROLOGICAL BOWEL NUTRITION STATUS      Continent    PHYSICIAN VISITS COMMUNICATION OF NEEDS Height & Weight Skin    Verbally 6' (182.9 cm) 161 lbs. Normal          AMBULATORY STATUS RESPIRATION    Assist independent        Personal Care Assistance Level of Assistance  Dressing, Bathing Bathing Assistance: Limited assistance   Dressing Assistance: Limited assistance      Functional Limitations Info                SPECIAL CARE FACTORS FREQUENCY  PT (By licensed PT), OT (By licensed OT)     PT Frequency: daily OT Frequency: daily           Additional Factors Info  Code Status, Allergies Code Status Info: DNR Allergies Info: NKA           Current Medications (05/08/2015): Current Facility-Administered Medications  Medication Dose Route Frequency Provider Last Rate Last Dose  . 0.9 %  sodium chloride infusion  250 mL Intravenous PRN Reyne Dumas, MD   Stopped at 05/06/15 1130  . acetaminophen (TYLENOL) tablet 650 mg  650 mg Oral Q6H PRN Reyne Dumas, MD       Or  . acetaminophen (TYLENOL) suppository 650  mg  650 mg Rectal Q6H PRN Reyne Dumas, MD      . ALPRAZolam [name]) tablet 0.25 mg  0.25 mg Oral BID PRN Reyne Dumas, MD   0.25 mg at 05/05/15 1433  . antiseptic oral rinse (CPC / CETYLPYRIDINIUM CHLORIDE 0.05%) solution 7 mL  7 mL Mouth Rinse q12n4p [name], MD   7 mL at 05/08/15 1200  . budesonide-formoterol (SYMBICORT) 160-4.5 MCG/ACT inhaler 2 puff  2 puff Inhalation BID Reyne Dumas, MD   2 puff at 05/05/15 2048  . chlorhexidine (PERIDEX) 0.12 % solution 15 mL  15 mL Mouth Rinse BID [name], MD   15 mL at 05/08/15 0914  . famotidine (PEPCID)  tablet 20 mg  20 mg Oral QHS Reyne Dumas, MD   20 mg at 05/07/15 2242  . feeding supplement (ENSURE ENLIVE) (ENSURE ENLIVE) liquid 237 mL  237 mL Oral BID BM Satira Anis Ward, RD   237 mL at 05/08/15 1000  . fluticasone (FLONASE) 50 MCG/ACT nasal spray 1 spray  1 spray Each Nare Daily Reyne Dumas, MD   1 spray at 05/08/15 0914  . gabapentin (NEURONTIN) capsule 300 mg  300 mg Oral TID Reyne Dumas, MD   300 mg at 05/08/15 0913  . guaiFENesin (MUCINEX) 12 hr tablet 600 mg  600 mg Oral BID Reyne Dumas, MD   600 mg at 05/08/15 0913  . HYDROmorphone (DILAUDID) injection 0.5 mg  0.5 mg Intravenous Q4H PRN Reyne Dumas, MD      . lactulose (CHRONULAC) 10 GM/15ML solution 10 g  10 g Oral BID PRN Reyne Dumas, MD   10 g at 05/05/15 1426  . levalbuterol (XOPENEX) nebulizer solution 1.25 mg  1.25 mg Nebulization Q4H PRN Reyne Dumas, MD      . mirtazapine (REMERON) tablet 30 mg  30 mg Oral QHS Reyne Dumas, MD   30 mg at 05/07/15 2242  . ondansetron (ZOFRAN) tablet 4 mg  4 mg Oral Q6H PRN Reyne Dumas, MD       Or  . ondansetron (ZOFRAN) injection 4 mg  4 mg Intravenous Q6H PRN Reyne Dumas, MD      . oxyCODONE (Oxy IR/ROXICODONE) immediate release tablet 5 mg  5 mg Oral Q6H PRN Reyne Dumas, MD      . pantoprazole (PROTONIX) EC tablet 40 mg  40 mg Oral Daily Reyne Dumas, MD   40 mg at 05/08/15 0913  . rivaroxaban (XARELTO) tablet 20 mg  20 mg Oral Q supper Reyne Dumas, MD   20 mg at 05/07/15 1704  . senna-docusate (Senokot-S) tablet 1 tablet  1 tablet Oral QHS PRN Reyne Dumas, MD   1 tablet at 05/07/15 1425  . sodium chloride 0.9 % injection 3 mL  3 mL Intravenous Q12H Reyne Dumas, MD   3 mL at 05/08/15 1000  . sodium chloride 0.9 % injection 3 mL  3 mL Intravenous Q12H Reyne Dumas, MD   3 mL at 05/08/15 1000  . sodium chloride 0.9 % injection 3 mL  3 mL Intravenous PRN Reyne Dumas, MD      . tamsulosin (FLOMAX) capsule 0.4 mg  0.4 mg Oral QHS Reyne Dumas, MD   0.4 mg at 05/07/15 2242    Facility-Administered Medications Ordered in Other Encounters  Medication Dose Route Frequency Provider Last Rate Last Dose  . heparin lock flush 100 unit/mL  500 Units Intracatheter Daily PRN Adrena E Johnson, PA-C      . sodium chloride 0.9 % injection 10  mL  10 mL Intracatheter PRN Adrena E Johnson, PA-C      . sodium chloride 0.9 % injection 3 mL  3 mL Intracatheter PRN Carlton Adam, PA-C       Do not use this list as official medication orders. Please verify with discharge summary.  Discharge Medications:   Medication List    ASK your doctor about these medications        albuterol 90 MCG/ACT inhaler  Commonly known as:  PROVENTIL,VENTOLIN  Inhale 2 puffs into the lungs every 6 (six) hours as needed for wheezing or shortness of breath.     ALPRAZolam 0.25 MG tablet  Commonly known as:  XANAX  Take 0.25 mg by mouth 2 (two) times daily as needed for anxiety.     budesonide-formoterol 160-4.5 MCG/ACT inhaler  Commonly known as:  SYMBICORT  Inhale 2 puffs into the lungs 2 (two) times daily.     dexamethasone 4 MG tablet  Commonly known as:  DECADRON  Take 1 tablet (4 mg total) by mouth 2 (two) times daily. For 5 days total.     docusate sodium 100 MG capsule  Commonly known as:  COLACE  Take 100 mg by mouth at bedtime as needed for mild constipation.     EX-LAX 15 MG Chew  Generic drug:  Sennosides  Chew 15 mg by mouth at bedtime as needed (constipation).     senna 8.6 MG tablet  Commonly known as:  SENOKOT  Take 1 tablet by mouth at bedtime as needed for constipation.     famotidine 20 MG tablet  Commonly known as:  PEPCID  Take 20 mg by mouth at bedtime.     fluticasone 50 MCG/ACT nasal spray  Commonly known as:  FLONASE  USE 2 SPRAYS IN EACH NOSTRIL AT NIGHT     furosemide 20 MG tablet  Commonly known as:  LASIX  TAKE 1 TABLET (20 MG TOTAL) BY MOUTH DAILY. AS NEEDED FOR SWELLING.     gabapentin 100 MG capsule  Commonly known as:  NEURONTIN  Take 300  mg by mouth 3 (three) times daily.     GOODY HEADACHE PO  Take 1 packet by mouth every 4 (four) hours as needed (pain/headache).     lactulose 10 GM/15ML solution  Commonly known as:  CHRONULAC  Take 15 mLs by mouth 2 (two) times daily as needed. constipaton     mirtazapine 30 MG tablet  Commonly known as:  REMERON  Take 1 tablet (30 mg total) by mouth at bedtime.     multivitamin with minerals Tabs tablet  Take 1 tablet by mouth daily.     naproxen sodium 220 MG tablet  Commonly known as:  ANAPROX  Take 440 mg by mouth 2 (two) times daily as needed (pain).     nitroGLYCERIN 0.4 MG SL tablet  Commonly known as:  NITROSTAT  Place 0.4 mg under the tongue every 5 (five) minutes as needed for chest pain (MAX 3 TABLETS).     omeprazole 40 MG capsule  Commonly known as:  PRILOSEC  Take 40 mg by mouth daily before supper.     oxyCODONE 5 MG immediate release tablet  Commonly known as:  Oxy IR/ROXICODONE  Take 1 tablet (5 mg total) by mouth every 6 (six) hours as needed for severe pain.     prochlorperazine 10 MG tablet  Commonly known as:  COMPAZINE  Take 10 mg by mouth every 6 (six) hours as needed for  nausea or vomiting.     rivaroxaban 20 MG Tabs tablet  Commonly known as:  XARELTO  Take 1 tablet (20 mg total) by mouth daily with supper.     tamsulosin 0.4 MG Caps capsule  Commonly known as:  FLOMAX  Take 0.4 mg by mouth at bedtime.        Relevant Imaging Results:  Relevant Lab Results:  Recent Labs SSN: [id number]  Additional Information    Farrel Conners, [place], LCSW

## 2015-05-08 NOTE — Evaluation (Signed)
Occupational Therapy Evaluation Patient Details Name: Nicholas Schmitt MRN: [id number] DOB: Oct 19, 1935 Today's Date: 05/08/2015    History of Present Illness 79 yo male admitted with SOB, chronic SVC obstruction, and large right pleural effusion. History significant for coronary artery disease, COPD Gold stage II, AAA, chronic kidney disease stage II, non-small cell lung cancer stage IV, status post craniotomy with tumor excision of brain metastasis on 12/04/14, recent history of pulmonary embolism, now on Xarelto and status post IVC filter placement on 02/77 without complications. Patient is also status post stereotactic radiosurgery for spinal metastasis on 04/16/15.   Clinical Impression   Pt was admitted for the above.  Pt lives alone and will benefit from skilled OT to increase safety with adls/iadls in both acute setting and at SNF.   Goals in acute are for supervision level for basic adls     Follow Up Recommendations  SNF    Equipment Recommendations  3 in 1 bedside comode    Recommendations for Other Services       Precautions / Restrictions Precautions Precautions: Fall Restrictions Weight Bearing Restrictions: No      Mobility Bed Mobility Overal bed mobility: Needs Assistance Bed Mobility: Supine to Sit     Supine to sit: Supervision     General bed mobility comments: moves quickly  Transfers Overall transfer level: Needs assistance Equipment used: Rolling walker (2 wheeled);None Transfers: Sit to/from Stand Sit to Stand: Min assist         General transfer comment: steadying assistance; cues for safety    Balance Overall balance assessment: Needs assistance;History of Falls   Sitting balance-Leahy Scale: Fair (at least fair, NT further)                                    ADL Overall ADL's : Needs assistance/impaired     Grooming: Wash/dry hands;Min guard;Standing   Upper Body Bathing: Set up;Sitting   Lower Body Bathing:  Min guard;Sit to/from stand   Upper Body Dressing : Set up;Sitting   Lower Body Dressing: Min guard;Sit to/from stand   Toilet Transfer: Minimal assistance;Ambulation;Comfort height toilet   Toileting- Clothing Manipulation and Hygiene: Min guard;Sit to/from stand         General ADL Comments: cues for safety with RW in bathroom, especially in tight spaces     Vision     Perception     Praxis      Pertinent Vitals/Pain Pain Assessment: No/denies pain Pain Score: 3  Pain Location: back and R LE during ambulation  Pain Descriptors / Indicators: Aching Pain Intervention(s): Limited activity within patient's tolerance;Monitored during session     Hand Dominance     Extremity/Trunk Assessment Upper Extremity Assessment Upper Extremity Assessment: Overall WFL for tasks assessed      Cervical / Trunk Assessment Cervical / Trunk Assessment: Normal   Communication Communication Communication: HOH   Cognition Arousal/Alertness: Awake/alert Behavior During Therapy: WFL for tasks assessed/performed;Impulsive Overall Cognitive Status: Impaired/Different from baseline Area of Impairment: Safety/judgement         Safety/Judgement: Decreased awareness of safety;Decreased awareness of deficits     General Comments: decreased safety   General Comments       Exercises       Shoulder Instructions      Home Living Family/patient expects to be discharged to:: Skilled nursing facility Living Arrangements: Alone Available Help at Discharge: Family (friend, June assists as much  as she can but can't provide 24hr care/supervision) Type of Home: House Home Access: Stairs to enter [employer] of Steps: 3 Entrance Stairs-Rails: Can reach both Home Layout: One level     Bathroom Shower/Tub: Teacher, early years/pre: Standard     Home Equipment: Environmental consultant - 2 wheels;Cane - single point          Prior Functioning/Environment Level of  Independence: Independent with assistive device(s)        Comments: uses walker for ambulation at times, 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" and his small house if full of "stuff"--making it difficult for him to move around    OT Diagnosis: Generalized weakness   OT Problem List: Decreased strength;Decreased activity tolerance;Impaired balance (sitting and/or standing);Decreased safety awareness   OT Treatment/Interventions: Self-care/ADL training;DME and/or AE instruction;Patient/family education;Balance training;Cognitive remediation/compensation    OT Goals(Current goals can be found in the care plan section) Acute Rehab OT Goals Patient Stated Goal: to go to rehab to get stronger (RLE) and to improve safe mobility at home; "get back to going to yard sales" OT Goal Formulation: With patient Time For Goal Achievement: 05/15/15 Potential to Achieve Goals: Good ADL Goals Pt Will Transfer to Toilet: with supervision;ambulating;regular height toilet;bedside commode (vs) Additional ADL Goal #1: pt will gather clothes at supervision level and complete adl with supervision with no more than one vc for safety  OT Frequency: Min 2X/week   Barriers to D/C:            Co-evaluation              End of Session    Activity Tolerance: Patient tolerated treatment well Patient left: in bed;with call bell/phone within reach;with bed alarm set   Time: 8315-1761 OT Time Calculation (min): 12 min Charges:  OT General Charges $OT Visit: 1 Procedure OT Evaluation $Initial OT Evaluation Tier I: 1 Procedure G-Codes:    Kylle Lall 2015/05/19, 2:47 PM Lesle Chris, OTR/L [phone] 19-May-2015

## 2015-05-08 NOTE — Progress Notes (Deleted)
OT Cancellation Note  Patient Details Name: Nicholas Schmitt MRN: [id number] DOB: 1936/05/04   Cancelled Treatment:    Reason Eval/Treat Not Completed: PT screened, no needs identified, will sign off  Paislyn Domenico 05/08/2015, 12:45 PM  Lesle Chris, OTR/L [phone] 05/08/2015

## 2015-05-08 NOTE — Care Management Note (Signed)
Case Management Note  Patient Details  Name: MAMORU TAKESHITA MRN: [id number] Date of Birth: December 30, 1935  Subjective/Objective:                    Action/Plan:d/c SNF in am.   Expected Discharge Date:   (unknown)               Expected Discharge Plan:  [place]  In-House Referral:  NA, Clinical Social Work  Discharge planning Services  CM Consult  Post Acute Care Choice:  NA Choice offered to:  NA  DME Arranged:    DME Agency:     HH Arranged:    [place] Agency:     Status of Service:  In process, will continue to follow  Medicare Important Message Given:    Date Medicare IM Given:    Medicare IM give by:    Date Additional Medicare IM Given:    Additional Medicare Important Message give by:     If discussed at [place] of Stay Meetings, dates discussed:    Additional Comments:  Dessa Phi, RN 05/08/2015, 3:58 PM

## 2015-05-08 NOTE — Clinical Social Work Note (Signed)
Clinical Social Work Assessment  Patient Details  Name: Nicholas Schmitt MRN: [id number] Date of Birth: 1935/12/15  Date of referral:  05/08/15               Reason for consult:  Facility Placement                Permission sought to share information with:    Permission granted to share information::     Name::        Agency::   ([name] and [facility])  Relationship::     Contact Information:     Housing/Transportation Living arrangements for the past 2 months:  Single Family Home Source of Information:  Patient Patient Interpreter Needed:  None Criminal Activity/Legal Involvement Pertinent to Current Situation/Hospitalization:    Significant Relationships:  Adult Children (son) Lives with:  Self Do you feel safe going back to the place where you live?  No Need for family participation in patient care:     Care giving concerns:  Son at bedside states he feels patient is unsafe to go home   Facilities manager / plan:  CSW assessed patient at beside. Son was present.  PT recommendations are SNF at time of discharge.  Son and patient are aware.  Patient reports being from home alone and son has concerns about patient returning home with little support.  Patient and son are both agreeable to SNF and the first choice is [name].  Patient has [employer] as a payer source and PT/OT evaluations will need to be completed to facilitate needed authorization.    Employment status:  Retired Forensic scientist:  [employer]) PT Recommendations:  [place] / Referral to community resources:  [place]  Patient/Family's Response to care:  Agreeable to SNF  Patient/Family's Understanding of and Emotional Response to Diagnosis, Current Treatment, and Prognosis:  Both son and patient are both aware of and realistic regarding the level of care needed for patient at time of discharge.  Emotional  Assessment Appearance:  Appears older than stated age Attitude/Demeanor/Rapport:   (appropriate) Affect (typically observed):  Accepting, Adaptable Orientation:  Oriented to Self, Oriented to Place, Oriented to  Time, Oriented to Situation Alcohol / Substance use:  Not Applicable Psych involvement (Current and /or in the community):  No (Comment)  Discharge Needs  Concerns to be addressed:  No discharge needs identified Readmission within the last 30 days:  No Current discharge risk:  None Barriers to Discharge:  No Barriers Identified   Dulcy Fanny, LCSW 05/08/2015, 2:04 PM

## 2015-05-08 NOTE — Evaluation (Signed)
Physical Therapy Evaluation Patient Details Name: Nicholas Schmitt MRN: [id number] DOB: Nov 25, 1935 Today's Date: 05/08/2015   History of Present Illness  79 yo male admitted with SOB, chronic SVC obstruction, and large right pleural effusion. History significant for coronary artery disease, COPD Gold stage II, AAA, chronic kidney disease stage II, non-small cell lung cancer stage IV, status post craniotomy with tumor excision of brain metastasis on 12/04/14, recent history of pulmonary embolism, now on Xarelto and status post IVC filter placement on 81/01 without complications. Patient is also status post stereotactic radiosurgery for spinal metastasis on 04/16/15.  Clinical Impression  Pt admitted with above diagnosis. Pt currently with functional limitations due to the deficits listed below (see PT Problem List).  Pt will benefit from skilled PT to increase their independence and safety with mobility to allow discharge to the venue listed below.   Recommend STSNF to address safety and balance issues; pt has had multiple falls at home; see below for further details and issues regarding safety/home situation      Follow Up Recommendations SNF    Equipment Recommendations  None recommended by PT    Recommendations for Other Services       Precautions / Restrictions Precautions Precautions: Fall Restrictions Weight Bearing Restrictions: No      Mobility  Bed Mobility Overal bed mobility: Needs Assistance Bed Mobility: Supine to Sit     Supine to sit: Supervision     General bed mobility comments: pt is quick to get oob d/t urgency to go to the bathroom, doesn't consider lines, cues to slow down and allow therapist to assist with lines   Transfers Overall transfer level: Needs assistance Equipment used: Rolling walker (2 wheeled);None Transfers: Sit to/from Stand Sit to Stand: Min assist         General transfer comment:  assist for safety, steadying/balance, cues for  proper technique and to slow down; requires incr cues and assist to avoid posterior LOB when stepping back to chair  Ambulation/Gait Ambulation/Gait assistance: Min guard Ambulation Distance (Feet):  (8' without RW, min assist) Assistive device: Rolling walker (2 wheeled) Gait Pattern/deviations: Step-through pattern;Decreased stride length     General Gait Details: min guard for safety, cues to stay close to the RW, cues for proper hand placement, oxygen and HR remained WNL during and after ambulation; pt with 2-3/4 DOE during amb, standing rest needed for recovery  Stairs            Wheelchair Mobility    Modified Rankin (Stroke Patients Only)       Balance Overall balance assessment: Needs assistance;History of Falls   Sitting balance-Leahy Scale: Fair (at least fair, NT further)       Standing balance-Leahy Scale: Fair Standing balance comment: pt is able to maintain static balance without UE support and close supervision for safety; requires assist for dynamic balance to prevent LOB/falls;                              Pertinent Vitals/Pain Pain Assessment: 0-10 Pain Score: 3  Pain Location: back and R LE during ambulation  Pain Descriptors / Indicators: Aching Pain Intervention(s): Limited activity within patient's tolerance;Monitored during session    Home Living Family/patient expects to be discharged to:: Skilled nursing facility (pt and family desire SNF) Living Arrangements: Alone Available Help at Discharge: Family (friend, June assists as much as she can but can't provide 24hr care/supervision) Type of Home: House  Home Access: Stairs to enter Entrance Stairs-Rails: Can reach both Entrance Stairs-Number of Steps: 3 Home Layout: One level Home Equipment: Walker - 2 wheels;Cane - single point      Prior Function Level of Independence: Independent with assistive device(s)         Comments: uses walker for ambulation at times, 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" and his small house if full of "stuff"--making it difficult for him to move around     Hand Dominance        Extremity/Trunk Assessment   Upper Extremity Assessment: Overall WFL for tasks assessed           Lower Extremity Assessment: LLE deficits/detail RLE Deficits / Details: hip flexion 2-/5, hip abduction 2+/5; knee extension 3/5; ankle grossly WFL; (pt reported) numbness R lower leg LLE Deficits / Details: grossly WFL per obs and gross testing  Cervical / Trunk Assessment: Normal  Communication   Communication: HOH  Cognition Arousal/Alertness: Awake/alert Behavior During Therapy: WFL for tasks assessed/performed;Impulsive (pt is slightly impulsive and not safe with movement ) Overall Cognitive Status: Impaired/Different from baseline Area of Impairment: Safety/judgement         Safety/Judgement: Decreased awareness of safety;Decreased awareness of deficits     General Comments: pt with multiple safety issues; does not demo safe technique mobilizing in smaller spaces (ie bathroom),  he forgets to pull up pants after toileting and amb to sink with them down, needing VCs to complete LB clothing manipulation    General Comments      Exercises        Assessment/Plan    PT Assessment    PT Diagnosis Difficulty walking   PT Problem List    PT Treatment Interventions     PT Goals (Current goals can be found in the Care Plan section) Acute Rehab PT Goals Patient Stated Goal: to go to rehab to get stronger (RLE) and to improve safe mobility at home; "get back to going to yard sales" PT Goal Formulation: With patient/family Time For Goal Achievement: 05/22/15 Potential to Achieve Goals: Good    Frequency     Barriers to discharge        Co-evaluation               End of Session Equipment Utilized During Treatment: Gait belt Activity Tolerance: Patient tolerated treatment well Patient left: in chair;with call bell/phone  within reach;with chair alarm set;with family/visitor present Nurse Communication: Mobility status         Time: 6681-5947 PT Time Calculation (min) (ACUTE ONLY): 25 min   Charges:   PT Evaluation $Initial PT Evaluation Tier I: 1 Procedure PT Treatments $Gait Training: 8-22 mins   PT G Codes:        Sheana Bir 05/31/2015, 2:09 PM

## 2015-05-08 NOTE — Telephone Encounter (Signed)
June worried that pt is on steroids and due for [place] next week . I told her it is okay if that's what he needs and that Julien Nordmann will discuss it with him at next appt. She stated pt may go to rehab after hospital discharge. She was emphatic to tell me that Ron  never said he wanted Hospice. Note to [place].

## 2015-05-08 NOTE — Clinical Social Work Placement (Signed)
   CLINICAL SOCIAL WORK PLACEMENT  NOTE  Date:  05/08/2015  Patient Details  Name: Nicholas Schmitt MRN: [id number] Date of Birth: 1936/01/11  Clinical Social Work is seeking post-discharge placement for this patient at the [place] level of care (*CSW will initial, date and re-position this form in  chart as items are completed):  Yes   Patient/family provided with [place] Work Department's list of facilities offering this level of care within the geographic area requested by the patient (or if unable, by the patient's family).  Yes   Patient/family informed of their freedom to choose among providers that offer the needed level of care, that participate in Medicare, Medicaid or managed care program needed by the patient, have an available bed and are willing to accept the patient.  Yes   Patient/family informed of [facility]'s ownership interest in [facility] and [facility], as well as of the fact that they are under no obligation to receive care at these facilities.  PASRR submitted to EDS on 05/08/15     PASRR number received on 05/08/15     Existing PASRR number confirmed on       FL2 transmitted to all facilities in geographic area requested by pt/family on 05/08/15     FL2 transmitted to all facilities within larger geographic area on       Patient informed that his/her managed care company has contracts with or will negotiate with certain facilities, including the following:            Patient/family informed of bed offers received.  Patient chooses bed at       Physician recommends and patient chooses bed at      Patient to be transferred to   on  .  Patient to be transferred to facility by       Patient family notified on   of transfer.  Name of family member notified:        PHYSICIAN       Additional Comment:    _______________________________________________ Dulcy Fanny, LCSW 05/08/2015, 2:16 PM

## 2015-05-09 DIAGNOSIS — I5189 Other ill-defined heart diseases: Secondary | ICD-10-CM | POA: Diagnosis present

## 2015-05-09 LAB — BODY FLUID CULTURE: CULTURE: NO GROWTH

## 2015-05-09 MED ORDER — ALPRAZOLAM 0.25 MG PO TABS: 0.2500 mg | ORAL_TABLET | Freq: Two times a day (BID) | ORAL | Status: AC | PRN

## 2015-05-09 MED ORDER — OXYCODONE HCL 5 MG PO TABS: 5.0000 mg | ORAL_TABLET | Freq: Four times a day (QID) | ORAL | Status: DC | PRN

## 2015-05-09 NOTE — Progress Notes (Signed)
°  Radiation Oncology         [phone] ________________________________  Name: Nicholas Schmitt MRN: [id number]  Date: 04/24/2015  DOB: 30-Dec-1935  End of Treatment Note   ICD-9-CM ICD-10-CM    1. 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     Indication for treatment:  Palliation       Radiation treatment dates:   04/16/2015, 04/20/2015, 04/22/2015, 04/24/2015  Site/dose/beams/energy:   The targeted metastasis in the L5 vertebral body was treated using 2 Rapid Arc VMAT Beams to a prescription dose of 9 Gy in 1 of 4 fractions to a total dose of 45 Gy. The beams were delivered with 10 MV X-rays in the flattening filter free mode.  Narrative: The patient tolerated radiation treatment relatively well, though his pain did not significantly improve during radiation. He did not experience any side effects from radiation.  Plan: The patient has completed radiation treatment. The patient will return to radiation oncology clinic for routine followup in one month. I advised him to call or return sooner if he has any questions or concerns related to his recovery or treatment. ________________________________  Sheral Apley. Tammi Klippel, M.D.  This document serves as a record of services personally performed by [name], MD. It was created on his behalf by Arlyce Harman, a trained medical scribe. The creation of this record is based on the scribe's personal observations and the provider's statements to them. This document has been checked and approved by the attending provider.

## 2015-05-09 NOTE — Progress Notes (Signed)
  Radiation Oncology         [phone] ________________________________  Name: Nicholas Schmitt MRN: [id number]  Date: 04/06/2015  DOB: Dec 03, 1935  SPECIAL TREATMENT PROCEDURE NOTE  NARRATIVE:  The planned course of therapy using radiation constitutes a special treatment procedure. Special care is required in the management of this patient for the following reasons.  I have requested : This treatment constitutes a Special Treatment Procedure for the following reason: [ Retreatment in a previously radiated area requiring careful monitoring of increased risk of toxicity due to overlap of previous treatment..   The special nature of the planned course of radiotherapy will require increased physician supervision and oversight to ensure patient's safety with optimal treatment outcomes. ________________________________  Sheral Apley Tammi Klippel, M.D.

## 2015-05-09 NOTE — Clinical Social Work Note (Signed)
CSW received a call that pt's insurance authorization for SNF had been approved.  Per Wells Guiles at [facility] They approved pt for rehab:  Auth #[id number] given for 5 days. RVA next review on 11/29.   CSW called [name] who was choice of pt to assess for bed availability this weekend should pt be ready for dc.  If pt is ready over the weekend they do have male bed for him if he is medically ready over the weekend.  CSW will meet with pt and provide above information.  Dede Query, LCSW [place] Worker - Weekend Coverage cell #: [phone]

## 2015-05-09 NOTE — Clinical Social Work Placement (Signed)
   CLINICAL SOCIAL WORK PLACEMENT  NOTE  Date:  05/09/2015  Patient Details  Name: DAOUD LOBUE MRN: [id number] Date of Birth: 08/20/1935  Clinical Social Work is seeking post-discharge placement for this patient at the [place] level of care (*CSW will initial, date and re-position this form in  chart as items are completed):  Yes   Patient/family provided with [place] Work Department's list of facilities offering this level of care within the geographic area requested by the patient (or if unable, by the patient's family).  Yes   Patient/family informed of their freedom to choose among providers that offer the needed level of care, that participate in Medicare, Medicaid or managed care program needed by the patient, have an available bed and are willing to accept the patient.  Yes   Patient/family informed of [place]'s ownership interest in [facility] and [facility], as well as of the fact that they are under no obligation to receive care at these facilities.  PASRR submitted to EDS on 05/08/15     PASRR number received on 05/08/15     Existing PASRR number confirmed on       FL2 transmitted to all facilities in geographic area requested by pt/family on 05/08/15     FL2 transmitted to all facilities within larger geographic area on       Patient informed that his/her managed care company has contracts with or will negotiate with certain facilities, including the following:            Patient/family informed of bed offers received.  Patient chooses bed at    [place] recommends and patient chooses bed at      Patient to be transferred to  [facility] on  May 09, 2015.  Patient to be transferred to facility by  friend June     Patient family notified on   May 09, 2015 of transfer.  Name of family member notified:   randy son     PHYSICIAN       Additional Comment:     _______________________________________________ Carlean Jews, LCSW 05/09/2015, 1:52 PM

## 2015-05-09 NOTE — Progress Notes (Signed)
Report called to [name]  And talked with Anne Ng.

## 2015-05-09 NOTE — Discharge Summary (Addendum)
Physician Discharge Summary  Nicholas Schmitt FBP:[id number] DOB: 08-Oct-1935 DOA: 05/05/2015  PCP: [name], MD  Admit date: 05/05/2015 Discharge date: 05/09/2015  Time spent: 40 minutes.  Recommendations for Outpatient Follow-up:  1. Follow-up with the nursing home M.D. 2. Follow-up with Dr. Julien Nordmann in 1-2 weeks.  Discharge Diagnoses:  Active Problems:   HLD (hyperlipidemia)   Coronary atherosclerosis   Abdominal aortic aneurysm (HCC)   Non-small cell carcinoma of lung, stage 4 (HCC)   Dyspnea   Anemia in neoplastic disease   SOB (shortness of breath)   DVT (deep venous thrombosis) (HCC)   Acute pulmonary embolism (HCC)   Pleural effusion, right   Discharge Condition: Stable  Diet recommendation: Heart healthy  Filed Weights   05/05/15 1100  Weight: 73.3 kg (161 lb 9.6 oz)    History of present illness:  79 year old male with a history of coronary artery disease, COPD Gold stage II, AAA, chronic kidney disease stage II, non-small cell lung cancer stage IV, status post craniotomy with tumor excision of brain metastasis on 12/04/14, recent history of pulmonary embolism, now on Xarelto and status post IVC filter placement on 82/42 without complications, who presents to the ER today with shortness of breath, no history of chest pain, or hemoptysis. Patient also complains of coughing spells with a lot of mucus production. Patient is status post chemotherapy with carboplatin and Alimita, every 3 weeks, last dose 05/20/14, currently receiving palliative radiotherapy to the right lung in the lumbar spinal metastasis under the care of Dr. Sondra Come. Patient is also status post stereotactic radiosurgery for spinal metastasis on 04/16/15. Patient currently receiving immunotherapy , with Nat Math (pembrolizumab), last dose was on 04/21/15. Patient continues to complain of fatigue, low back pain, denies any nausea vomiting but does complain of constipation. In the ER the patient was  found to be 96% on room air, blood pressure in the low 353I systolic range. CT angio chest, does not show acute PE, chronic SVC obstruction, large right pleural effusion. Abdominal KUB shows small to moderate amount of constipation  Hospital Course:   Right-sided pleural effusion, large Presented the hospital with shortness of breath, interventional radiology consulted. Right-sided US guided paracentesis done yesterday with removal of 650 mL of serosanguineous fluids. Patient has been on Xarelto since October for his acute pulmonary embolism. Cytology showed no malignant cells only reactive cells identified. Bloody effusion could be likely secondary to Xarelto. Patient to follow-up with Dr. Earlie Server as outpatient.  Right atrial mobile mass, asymptomatic Echo done today shows large tricuspid vegetation, Dr Einar Gip consulted. Started initially on antibiotics empirically to cover for endocarditis. TEE showed a right atrial mass rather than valvular vegetation. Discussed with Dr. Einar Gip, this is looks more like atrial mass rather than bacterial vegetation or marantic endocarditis. Doesn't look like a clot either, patient already on Xarelto. Findings consistent with myxoma, this lesion was not visible on 2-D echo from February 2016. Monitor clinically. Patient has stage IV NSCLC. Discussed with Dr. Marin Olp of oncology, if this is secondary to the cancer, Nat Math is a treatment. No further intervention, if this is myxoma monitor, if this is secondary to the cancer patient already on cancer treatment.  Non-small cell carcinoma of lung, stage 4 Patient follows with Dr. Earlie Server Patient has metastases to his brain, the right-sided pleural effusion did not show malignant cells.  HLD (hyperlipidemia)- stable Currently on no treatment  Coronary atherosclerosis- 2-D echo shows LV EF: 55% -  60%, Wall motion was normal;no chest pain,  Abdominal aortic aneurysm Stable  COPD GOLD II, with acute  COPD exacerbation We'll start the patient on empiric steroids, antibiotics as the patient is immunocompromised as is currently undergoing chemotherapy, repeat chest x-ray tomorrow Patient currently on Decadron which we can hold in the setting of administering IV Solu-Medrol Continue nebulizer treatments  Chronic back pain Continue gabapentin, steroids, patient states that his ambulation has improved since his spinal surgery 2 weeks ago, does not complain of any focal deficits, no recent falls  Anemia in neoplastic disease Baseline hemoglobin around 8.5-9.0, hemoglobin at baseline  Recent Acute pulmonary embolism Continues Xarelto  Nonsustained VT Had 13 beats of VT during this hospitalization. Potassium and magnesium okay, continue beta blockers.   Procedures:  None  Consultations:  Cardiology for TEE.  Oncology  Discharge Exam: Filed Vitals:   05/08/15 2034 05/09/15 0605  BP: 109/54 116/72  Pulse: 70 80  Temp: 98.2 F (36.8 C) 98.4 F (36.9 C)  Resp: 18 18  General: Alert and awake, oriented x3, not in any acute distress. HEENT: anicteric sclera, pupils reactive to light and accommodation, EOMI CVS: S1-S2 clear, no murmur rubs or gallops Chest: clear to auscultation bilaterally, no wheezing, rales or rhonchi Abdomen: soft nontender, nondistended, normal bowel sounds, no organomegaly Extremities: no cyanosis, clubbing or edema noted bilaterally Neuro: Cranial nerves II-XII intact, no focal neurological deficits  Discharge Instructions   Discharge Instructions    Diet - low sodium heart healthy    Complete by:  As directed      Increase activity slowly    Complete by:  As directed           Current Discharge Medication List    CONTINUE these medications which have CHANGED   Details  ALPRAZolam (XANAX) 0.25 MG tablet Take 1 tablet (0.25 mg total) by mouth 2 (two) times daily as needed for anxiety. Qty: 10 tablet, Refills: 0    oxyCODONE (OXY  IR/ROXICODONE) 5 MG immediate release tablet Take 1 tablet (5 mg total) by mouth every 6 (six) hours as needed for severe pain. Qty: 10 tablet, Refills: 0   Associated Diagnoses: Non-small cell carcinoma of lung, stage 4, right ([place]); Brain metastasis ([place]); Metastasis to brain [facility]); Encounter for antineoplastic immunotherapy      CONTINUE these medications which have NOT CHANGED   Details  albuterol (PROVENTIL,VENTOLIN) 90 MCG/ACT inhaler Inhale 2 puffs into the lungs every 6 (six) hours as needed for wheezing or shortness of breath.    budesonide-formoterol (SYMBICORT) 160-4.5 MCG/ACT inhaler Inhale 2 puffs into the lungs 2 (two) times daily.   Associated Diagnoses: COPD mixed type (HCC)    docusate sodium (COLACE) 100 MG capsule Take 100 mg by mouth at bedtime as needed for mild constipation.    famotidine (PEPCID) 20 MG tablet Take 20 mg by mouth at bedtime.    fluticasone (FLONASE) 50 MCG/ACT nasal spray USE 2 SPRAYS IN EACH NOSTRIL AT NIGHT Refills: 5    gabapentin (NEURONTIN) 100 MG capsule Take 300 mg by mouth 3 (three) times daily.    lactulose (CHRONULAC) 10 GM/15ML solution Take 15 mLs by mouth 2 (two) times daily as needed. constipaton Refills: 1    mirtazapine (REMERON) 30 MG tablet Take 1 tablet (30 mg total) by mouth at bedtime. Qty: 30 tablet, Refills: 2   Associated Diagnoses: Non-small cell carcinoma of lung, stage 4, right ([place]); Brain metastasis ([place]); Metastasis to brain [facility]); Encounter for antineoplastic immunotherapy    Multiple Vitamin (MULTIVITAMIN WITH MINERALS) TABS tablet  Take 1 tablet by mouth daily.    naproxen sodium (ANAPROX) 220 MG tablet Take 440 mg by mouth 2 (two) times daily as needed (pain).    omeprazole (PRILOSEC) 40 MG capsule Take 40 mg by mouth daily before supper.  Refills: 0    rivaroxaban (XARELTO) 20 MG TABS tablet Take 1 tablet (20 mg total) by mouth daily with supper. Qty: 30 tablet, Refills: 2   Associated Diagnoses: Non-small  cell carcinoma of lung, stage 4, right ([place]); Brain metastasis ([place]); Metastasis to brain [facility]); Encounter for antineoplastic immunotherapy    senna (SENOKOT) 8.6 MG tablet Take 1 tablet by mouth at bedtime as needed for constipation.    tamsulosin (FLOMAX) 0.4 MG CAPS capsule Take 0.4 mg by mouth at bedtime.  Refills: 2    dexamethasone (DECADRON) 4 MG tablet Take 1 tablet (4 mg total) by mouth 2 (two) times daily. For 5 days total. 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    prochlorperazine (COMPAZINE) 10 MG tablet Take 10 mg by mouth every 6 (six) hours as needed for nausea or vomiting.    Sennosides (EX-LAX) 15 MG CHEW Chew 15 mg by mouth at bedtime as needed (constipation).       STOP taking these medications     Aspirin-Acetaminophen-Caffeine (GOODY HEADACHE PO)      furosemide (LASIX) 20 MG tablet        No Active Allergies    The results of significant diagnostics from this hospitalization (including imaging, microbiology, ancillary and laboratory) are listed below for reference.    Significant Diagnostic Studies: Dg Chest 1 View  05/05/2015  CLINICAL DATA:  Post right thoracentesis. EXAM: CHEST 1 VIEW COMPARISON:  05/05/2015 FINDINGS: Persistent right pleural effusion with probable consolidation in the right lung base causing tenting of the hemidiaphragm. Mild improvement of aeration in the right lung. Prominence of the right hilum with volume loss in the right upper lung suggests obstructing mass lesion. No pneumothorax. Left lung appears clear and expanded with mild hyperinflation. Heart size and pulmonary vascularity are normal for technique. Calcification of the aorta. Cardiac pacemaker. Degenerative changes in the spine and shoulders. IMPRESSION: Persistent right pleural effusion with  mild improvement of aeration in the right lung post thoracentesis. No pneumothorax. The volume loss in the right upper lung with right hilar prominence suggesting obstructing mass lesion. Electronically Signed   By: Lucienne Capers M.D.   On: 05/05/2015 18:29   Dg Chest 2 View  05/05/2015  CLINICAL DATA:  Acute onset of worsening shortness of breath and generalized weakness. Initial encounter. EXAM: CHEST  2 VIEW COMPARISON:  Chest radiograph performed 07/23/2014, and CT of the chest performed 03/27/2015 FINDINGS: A relatively large right-sided pleural effusion is noted, with underlying airspace opacification. Mild vascular congestion is noted. The left lung is grossly clear. No pneumothorax is seen. The cardiomediastinal silhouette is borderline normal in size. A pacemaker is noted at the left chest wall, with leads ending at the right atrium and right ventricle. No acute osseous abnormalities are seen. IMPRESSION: Increasing relatively large right-sided pleural effusion, with underlying airspace opacification. Mild vascular congestion noted. This is concerning for a malignant effusion, given the patient's known lung cancer. Underlying pneumonia cannot be entirely excluded, depending on the patient's symptoms. Electronically Signed   By: Jacqulynn Cadet  Chang M.D.   On: 05/05/2015 06:36   Ct Angio Chest Pe W/cm &/or Wo Cm  05/05/2015  CLINICAL DATA:  Shortness of breath. Pleural effusion. History pulmonary embolism. EXAM: CT ANGIOGRAPHY CHEST WITH CONTRAST TECHNIQUE: Multidetector CT imaging of the chest was performed using the standard protocol during bolus administration of intravenous contrast. Multiplanar CT image reconstructions and MIPs were obtained to evaluate the vascular anatomy. CONTRAST:  164m OMNIPAQUE IOHEXOL 350 MG/ML SOLN COMPARISON:  03/27/2015 FINDINGS: THORACIC INLET/BODY WALL: Body wall edema and extensive venous collaterals, both intra and extra thoracic, secondary to chronic SVC  obstruction. No axillary or supraclavicular adenopathy detected. MEDIASTINUM: No cardiomegaly or pericardial effusion. Dual-chamber pacer from the right. Suboptimal opacification of the pulmonary arterial tree due to SVC obstruction with bolus dispersion and intermittent motion. Right-sided pulmonary arteries are distorted and attenuated without central acute filling defect. Previous pulmonary embolism to the right lower lobe, with peripheral thick walled appearance compatible with endothelialized clot. No convincing pulmonary embolism, limited in subsegmental arteries. No acute aortic finding. No progressive mediastinal adenopathy. LUNG WINDOWS: Extensive atelectasis and scarring around the right hilum with bronchial distortion. There is a progressive and large layering right pleural effusion without pleural nodularity or thickening. No superimposed consolidation or edema. UPPER ABDOMEN: Stable bilateral adrenal nodularity, presumed metastases. No acute finding. OSSEOUS: No acute finding. Review of the MIP images confirms the above findings. IMPRESSION: 1. No convincing acute pulmonary embolism. 2. Partial re- cannulization of recent right lower lobe pulmonary embolism. 3. Chronic SVC obstruction which limits CTA. 4. Large and progressive layering right pleural effusion. Electronically Signed   By: JMonte FantasiaM.D.   On: 05/05/2015 08:42   Dg Abd 2 Views  05/05/2015  CLINICAL DATA:  Acute onset of mid abdominal pain and constipation. Initial encounter. EXAM: ABDOMEN - 2 VIEW COMPARISON:  CT of the abdomen and pelvis performed 03/27/2015 FINDINGS: The visualized bowel gas pattern is unremarkable. Scattered air and stool filled loops of colon are seen; no abnormal dilatation of small bowel loops is seen to suggest small bowel obstruction. No free intra-abdominal air is identified, though evaluation for free air is limited on a single supine view. The visualized osseous structures are within normal limits; the  sacroiliac joints are unremarkable in appearance. A large right-sided pleural effusion is partially imaged. Pacemaker leads are partially seen. An IVC filter is noted. Brachytherapy seeds are seen overlying the prostate bed. IMPRESSION: Unremarkable bowel gas pattern; no free intra-abdominal air seen. Small to moderate amount of stool noted in the colon. Electronically Signed   By: JGarald BaldingM.D.   On: 05/05/2015 06:38   UKoreaThoracentesis Asp Pleural Space W/img Guide  05/05/2015  CLINICAL DATA:  79year old male with a history of new right-sided pleural effusion. He has been referred for diagnostic and therapeutic thoracentesis. EXAM: ULTRASOUND GUIDED right THORACENTESIS COMPARISON:  CT 05/05/2015 PROCEDURE: An ultrasound guided thoracentesis was thoroughly discussed with the patient and questions answered. The benefits, risks, alternatives and complications were also discussed. The patient understands and wishes to proceed with the procedure. Written consent was obtained. Ultrasound was performed to localize and mark an adequate pocket of fluid in the right chest. The area was then prepped and draped in the normal sterile fashion. 1% Lidocaine was used for local anesthesia. Under ultrasound guidance a Safe-T-Centesis catheter was introduced. Thoracentesis was performed. The catheter was removed and a dressing applied. Patient tolerated the procedure well and remained hemodynamically stable throughout. No complications encountered. COMPLICATIONS: None FINDINGS: A total  of approximately 650 cc of serosanguineous fluid was removed. A fluid sample wassent for laboratory analysis. IMPRESSION: Status post right-sided thoracentesis.  650 cc of fluid removed. Sample was sent to the lab. Signed, Dulcy Fanny. Earleen Newport, DO [facility] Electronically Signed   By: Corrie Mckusick D.O.   On: 05/05/2015 18:26    Microbiology: Recent Results (from the past 240  hour(s))  MRSA PCR Screening     Status: None   Collection Time: 05/05/15  9:35 AM  Result Value Ref Range Status   MRSA by PCR NEGATIVE NEGATIVE Final    Comment:        The GeneXpert MRSA Assay (FDA approved for NASAL specimens only), is one component of a comprehensive MRSA colonization surveillance program. It is not intended to diagnose MRSA infection nor to guide or monitor treatment for MRSA infections.   Body fluid culture     Status: None   Collection Time: 05/05/15  6:13 PM  Result Value Ref Range Status   Specimen Description PLEURAL RIGHT  Final   Special Requests NONE  Final   Gram Stain   Final    MODERATE WBC PRESENT, PREDOMINANTLY MONONUCLEAR NO ORGANISMS SEEN    Culture   Final    NO GROWTH 3 DAYS Performed at [facility]    Report Status 05/09/2015 FINAL  Final  Culture, blood (routine x 2)     Status: None (Preliminary result)   Collection Time: 05/05/15  6:36 PM  Result Value Ref Range Status   Specimen Description BLOOD RIGHT ANTECUBITAL  Final   Special Requests BOTTLES DRAWN AEROBIC AND ANAEROBIC 3CC  Final   Culture   Final    NO GROWTH 3 DAYS Performed at [facility]    Report Status PENDING  Incomplete  Culture, blood (routine x 2)     Status: None (Preliminary result)   Collection Time: 05/05/15  6:44 PM  Result Value Ref Range Status   Specimen Description BLOOD RIGHT HAND  Final   Special Requests BOTTLES DRAWN AEROBIC AND ANAEROBIC 2CC  Final   Culture   Final    NO GROWTH 3 DAYS Performed at [facility]    Report Status PENDING  Incomplete     Labs: Basic Metabolic Panel:  Recent Labs Lab 05/05/15 0545 05/05/15 0805 05/06/15 0325 05/07/15 0535  NA 141  --  139 141  K 3.8  --  4.2 4.4  CL 106  --  103 104  CO2 26  --  27 29  GLUCOSE 95  --  160* 113*  BUN 13  --  14 16  CREATININE 0.64  --  0.81 0.68  CALCIUM 9.2  --  9.2 9.3  MG  --  1.7  --  2.0   Liver Function Tests:  Recent Labs Lab  05/05/15 0545 05/06/15 0325  AST 13* 18  ALT 11* 11*  ALKPHOS 81 81  BILITOT 0.7 0.7  PROT 6.5 6.5  ALBUMIN 3.0* 3.0*   No results for input(s): LIPASE, AMYLASE in the last 168 hours. No results for input(s): AMMONIA in the last 168 hours. CBC:  Recent Labs Lab 05/05/15 0545 05/06/15 0325  WBC 3.1* 5.2  NEUTROABS 2.1  --   HGB 8.3* 8.7*  HCT 28.5* 29.2*  MCV 87.4 88.2  PLT 143* 164   Cardiac Enzymes:  Recent Labs Lab 05/05/15 0805 05/05/15 1836  TROPONINI <0.03 0.10*   BNP: BNP (last 3 results)  Recent Labs  07/21/14 1821 05/05/15 0556  BNP 39.7 74.3    ProBNP (last 3 results) No results for input(s): PROBNP in the last 8760 hours.  CBG: No results for input(s): GLUCAP in the last 168 hours.     Signed:  Nijae Doyel A  [facility] 05/09/2015, 11:56 AM

## 2015-05-10 LAB — CULTURE, BLOOD (ROUTINE X 2)
CULTURE: NO GROWTH
Culture: NO GROWTH

## 2015-05-11 ENCOUNTER — Telehealth: Payer: Self-pay | Admitting: *Deleted

## 2015-05-11 DIAGNOSIS — J910 Malignant pleural effusion: Secondary | ICD-10-CM | POA: Insufficient documentation

## 2015-05-11 NOTE — Telephone Encounter (Signed)
Jane NP with [facility] called on "mutual patient admitted to them a few days ago after hospitalization.  Patient is concerned, currently on decadron 4 mg bid x 5 days, "have never taken steroids with chemotherapy.  Pharmacy notified.  Conversion shared with Dr. Julien Nordmann .  Verbal order received and read back from Dr. Julien Nordmann for decadron one tablet not two .  Order given to Opal Sidles NP with [facility] at this time.

## 2015-05-11 NOTE — Telephone Encounter (Signed)
Call from June with concerns about pt's upcoming appt. Pt currently at [name]. Unable to  Reach pt or June. LVM for June MD will be happy to see pt if he is able to make it. If he is unable to please call and we will r/s for later date.

## 2015-05-12 ENCOUNTER — Ambulatory Visit (HOSPITAL_BASED_OUTPATIENT_CLINIC_OR_DEPARTMENT_OTHER): Payer: Medicare Other

## 2015-05-12 ENCOUNTER — Ambulatory Visit: Payer: Medicare Other | Admitting: Nutrition

## 2015-05-12 ENCOUNTER — Telehealth: Payer: Self-pay | Admitting: Internal Medicine

## 2015-05-12 ENCOUNTER — Other Ambulatory Visit: Payer: Self-pay | Admitting: Medical Oncology

## 2015-05-12 ENCOUNTER — Other Ambulatory Visit (HOSPITAL_BASED_OUTPATIENT_CLINIC_OR_DEPARTMENT_OTHER): Payer: Medicare Other

## 2015-05-12 ENCOUNTER — Ambulatory Visit (HOSPITAL_BASED_OUTPATIENT_CLINIC_OR_DEPARTMENT_OTHER): Payer: Medicare Other | Admitting: Internal Medicine

## 2015-05-12 ENCOUNTER — Encounter: Payer: Self-pay | Admitting: Internal Medicine

## 2015-05-12 ENCOUNTER — Other Ambulatory Visit: Payer: Self-pay | Admitting: Internal Medicine

## 2015-05-12 ENCOUNTER — Telehealth: Payer: Self-pay | Admitting: Medical Oncology

## 2015-05-12 VITALS — BP 95/56 | HR 86 | Temp 97.8°F | Resp 21 | Ht 72.0 in | Wt 164.0 lb

## 2015-05-12 DIAGNOSIS — C3491 Malignant neoplasm of unspecified part of right bronchus or lung: Secondary | ICD-10-CM

## 2015-05-12 DIAGNOSIS — E039 Hypothyroidism, unspecified: Secondary | ICD-10-CM | POA: Insufficient documentation

## 2015-05-12 DIAGNOSIS — C7931 Secondary malignant neoplasm of brain: Secondary | ICD-10-CM

## 2015-05-12 DIAGNOSIS — Z86711 Personal history of pulmonary embolism: Secondary | ICD-10-CM | POA: Diagnosis not present

## 2015-05-12 DIAGNOSIS — C7951 Secondary malignant neoplasm of bone: Secondary | ICD-10-CM

## 2015-05-12 DIAGNOSIS — Z79899 Other long term (current) drug therapy: Secondary | ICD-10-CM

## 2015-05-12 DIAGNOSIS — Z86718 Personal history of other venous thrombosis and embolism: Secondary | ICD-10-CM

## 2015-05-12 DIAGNOSIS — E032 Hypothyroidism due to medicaments and other exogenous substances: Secondary | ICD-10-CM

## 2015-05-12 DIAGNOSIS — C61 Malignant neoplasm of prostate: Secondary | ICD-10-CM

## 2015-05-12 DIAGNOSIS — Z5112 Encounter for antineoplastic immunotherapy: Secondary | ICD-10-CM | POA: Diagnosis not present

## 2015-05-12 LAB — COMPREHENSIVE METABOLIC PANEL (CC13)
ALBUMIN: 3 g/dL — AB (ref 3.5–5.0)
ALK PHOS: 69 U/L (ref 40–150)
ALT: 15 U/L (ref 0–55)
AST: 14 U/L (ref 5–34)
Anion Gap: 7 mEq/L (ref 3–11)
BILIRUBIN TOTAL: 0.69 mg/dL (ref 0.20–1.20)
BUN: 14.9 mg/dL (ref 7.0–26.0)
CO2: 27 meq/L (ref 22–29)
CREATININE: 0.7 mg/dL (ref 0.7–1.3)
Calcium: 9.2 mg/dL (ref 8.4–10.4)
Chloride: 105 mEq/L (ref 98–109)
EGFR: >90 mL/min/{1.73_m2} (ref >90)
GLUCOSE: 82 mg/dL (ref 70–140)
Potassium: 3.7 mEq/L (ref 3.5–5.1)
Sodium: 139 mEq/L (ref 136–145)
Total Protein: 6 g/dL — ABNORMAL LOW (ref 6.4–8.3)

## 2015-05-12 LAB — CBC WITH DIFFERENTIAL/PLATELET
BASO%: 0.8 % (ref 0.0–2.0)
BASOS ABS: 0 10*3/uL (ref 0.0–0.1)
EOS%: 3.6 % (ref 0.0–7.0)
Eosinophils Absolute: 0.2 10*3/uL (ref 0.0–0.5)
HEMATOCRIT: 28.9 % — AB (ref 38.4–49.9)
HGB: 8.8 g/dL — ABNORMAL LOW (ref 13.0–17.1)
LYMPH#: 0.7 10*3/uL — AB (ref 0.9–3.3)
LYMPH%: 13.3 % — AB (ref 14.0–49.0)
MCH: 26.5 pg — AB (ref 27.2–33.4)
MCHC: 30.5 g/dL — AB (ref 32.0–36.0)
MCV: 86.9 fL (ref 79.3–98.0)
MONO#: 0.6 10*3/uL (ref 0.1–0.9)
MONO%: 11.2 % (ref 0.0–14.0)
NEUT#: 3.9 10*3/uL (ref 1.5–6.5)
NEUT%: 71.1 % (ref 39.0–75.0)
PLATELETS: 111 10*3/uL — AB (ref 140–400)
RBC: 3.33 10*6/uL — AB (ref 4.20–5.82)
RDW: 23.9 % — ABNORMAL HIGH (ref 11.0–14.6)
WBC: 5.5 10*3/uL (ref 4.0–10.3)

## 2015-05-12 LAB — TSH CHCC: TSH: 10.598 m(IU)/L — ABNORMAL HIGH (ref 0.320–4.118)

## 2015-05-12 MED ORDER — LEVOTHYROXINE SODIUM 50 MCG PO TABS: 50.0000 ug | ORAL_TABLET | Freq: Every day | ORAL | Status: AC

## 2015-05-12 MED ORDER — SODIUM CHLORIDE 0.9 % IV SOLN
200.0000 mg | Freq: Once | INTRAVENOUS | Status: AC
  Administered 2015-05-12: 200 mg via INTRAVENOUS
  Filled 2015-05-12: qty 8

## 2015-05-12 MED ORDER — PROCHLORPERAZINE MALEATE 10 MG PO TABS
ORAL_TABLET | ORAL | Status: AC
  Filled 2015-05-12: qty 1

## 2015-05-12 MED ORDER — PROCHLORPERAZINE MALEATE 10 MG PO TABS
10.0000 mg | ORAL_TABLET | Freq: Once | ORAL | Status: AC
  Administered 2015-05-12: 10 mg via ORAL

## 2015-05-12 MED ORDER — SODIUM CHLORIDE 0.9 % IV SOLN
Freq: Once | INTRAVENOUS | Status: AC
  Administered 2015-05-12: 10:00:00 via INTRAVENOUS

## 2015-05-12 MED ORDER — LEVOTHYROXINE SODIUM 50 MCG PO TABS
50.0000 ug | ORAL_TABLET | Freq: Every day | ORAL | Status: DC
Start: 2015-05-12 — End: 2015-05-12

## 2015-05-12 NOTE — Patient Instructions (Signed)
[facility] Discharge Instructions for Patients Receiving Chemotherapy  Today you received the following chemotherapy agents: Keytruda.  To help prevent nausea and vomiting after your treatment, we encourage you to take your nausea medication: compazine 10 mg every 6 hours as needed.   If you develop nausea and vomiting that is not controlled by your nausea medication, call the clinic.   BELOW ARE SYMPTOMS THAT SHOULD BE REPORTED IMMEDIATELY:  *FEVER GREATER THAN 100.5 F  *CHILLS WITH OR WITHOUT FEVER  NAUSEA AND VOMITING THAT IS NOT CONTROLLED WITH YOUR NAUSEA MEDICATION  *UNUSUAL SHORTNESS OF BREATH  *UNUSUAL BRUISING OR BLEEDING  TENDERNESS IN MOUTH AND THROAT WITH OR WITHOUT PRESENCE OF ULCERS  *URINARY PROBLEMS  *BOWEL PROBLEMS  UNUSUAL RASH Items with * indicate a potential emergency and should be followed up as soon as possible.  Feel free to call the clinic you have any questions or concerns. The clinic phone number is [phone].  Please show the [facility] at check-in to the Emergency Department and triage nurse.

## 2015-05-12 NOTE — Progress Notes (Signed)
Nutrition follow-up completed with patient and wife, during infusion for lung cancer. Weight was documented as 164 pounds November 29, improved from 163.5 pounds November 8. Patient states he is eating better and is continuing to drink oral nutrition supplements. Patient has no nutrition needs.  Nutrition diagnosis: Food and nutrition related knowledge deficit improved.  Intervention: Recommended patient continue oral nutrition supplements twice a day Encouraged high-calorie, high-protein foods to promote weight maintenance/weight gain. Teach back method used.  Monitoring, evaluation, goals: Patient will continue to tolerate oral intake for weight stabilization/weight gain.  Next visit: Tuesday, January 10, during infusion.  **Disclaimer: This note was dictated with voice recognition software. Similar sounding words can inadvertently be transcribed and this note may contain transcription errors which may not have been corrected upon publication of note.**

## 2015-05-12 NOTE — Telephone Encounter (Signed)
synthroid rx faxed to [name] -#[phone] , no refills.

## 2015-05-12 NOTE — Telephone Encounter (Signed)
Gave and printed appt sched and avs for pt for DEC and Jan 2017

## 2015-05-12 NOTE — Progress Notes (Signed)
Quick Note:  Call patient with the result and I will start him on Levothyroxine 50 mcg. Will send Rx to his pharmacy ______

## 2015-05-12 NOTE — Progress Notes (Signed)
Nicholas Schmitt:([phone]) [phone]   Fax:([phone]) [phone]  OFFICE PROGRESS NOTE  [name],[name], MD [place] [address]  DIAGNOSIS: Non-small cell carcinoma of lung, stage 4  Primary site: Lung (Right)  Staging method: AJCC 7th Edition  Clinical: Stage IV (T1a, N3, M1b) signed by Curt Bears, MD on 02/01/2014 1:42 PM  Summary: Stage IV (T1a, N3, M1b) Prostate cancer  Primary site: Prostate  Clinical: Stage I (T1c, NX, M0) signed by Wyatt Portela, MD on 08/06/2013 1:59 PM   Summary: Stage I (T1c, NX, M0)  PRIOR THERAPY:  1) Systemic chemotherapy with carboplatin for an AUC of 5 and Alimta 500 mg per meter squared given every 3 weeks. Status post 6 cycles, last dose was given 05/20/2014 discontinued secondary to disease progression. 2) palliative radiotherapy to the right lung and lumbar spines under the care of Dr. Sondra Come. 3) status post craniotomy with tumor excision under the care of Dr. Vertell Limber on 12/04/2014.  CURRENT THERAPY:  1) Immunotherapy with Ketruda (pembrolizumab) 200 KG every 3 weeks. First dose 07/29/2014. He has positive PDL 1 expression (90%). Status post 12 cycles. 2) Xarelto 20 mg by mouth daily for deep venous thrombosis and pulmonary embolism.  INTERVAL HISTORY: Nicholas Schmitt 79 y.o. male returns to the clinic today for follow-up visit accompanied by his wife. He is feeling fine today with no specific complaints except for fatigue and low back pain. He was recently admitted to [facility] with increasing shortness of breath and cough productive of thick sputum. CT angiogram of the chest performed during his hospitalization showed no evidence for acute pulmonary embolism but there was chronic SVC obstruction and large right pleural effusion. The patient underwent right-sided ultrasound-guided thoracentesis with drainage of 650 ML off pleural fluid. He was also found to have right atrial multiple mass  and the patient underwent 2-D echo followed by TEE that showed a questionable atrial mass rather than a bacterial vegetation the mass is questionable for myxoma. The patient is currently a resident of [facility] skilled nursing facility. He is feeling much better today and he is here to resume his treatment with immunotherapy. He denied having any significant fever or chills, no nausea or vomiting. The patient denied having any significant chest pain, shortness of breath except with exertion, cough or hemoptysis.   MEDICAL HISTORY: Past Medical History  Diagnosis Date  . CAD (coronary artery disease)     positive stress test in 2006 led to left heart cath (8/06) showing 95% prox RCA, 90% CFX, and 90% mLAD.  patient had a cypher DES to all 3 lesions  . Hyperlipidemia   . Hearing loss   . AAA (abdominal aortic aneurysm) ([place]) 11/09    3.2 cm   . Chronic renal insufficiency   . History of radiation therapy 03/08/04- 04/08/04    prostate 4600 cGy in 3 fractions, radioactive seed implant 05/04/04  . Myocardial infarction ([place]) 1995  . Pacemaker 2014  . Shortness of breath     exertion  . COPD (chronic obstructive pulmonary disease) ([place])   . Radiation Jan.11-Jan 29/2016    Right central chest 35 gray in 14 fractions  . Radiation Jan.2016    35 gray in 14 fx lower lumbar/upper sacrum  . Diverticulosis   . Hemorrhoids   . Pneumonia     hx  . GERD (gastroesophageal reflux disease)   . Hypertension     no med now for 3-4  months  . Prostate cancer ([place]) 2005    gleason 7  . Skin cancer     ear  . Lung cancer ([place])   . Cancer [facility]) 2015    prostate   . Cancer [facility] [place] [facility]) December 04, 2014    Cranial   . Cancer [facility]) 04/01/2015    spine    ALLERGIES:  has no active allergies.  MEDICATIONS:  Current Outpatient Prescriptions  Medication Sig Dispense Refill  . albuterol (PROVENTIL HFA;VENTOLIN HFA) 108 (90 BASE) MCG/ACT inhaler Inhale into the lungs every 6 (six) hours as needed for wheezing or  shortness of breath.    [name] albuterol (PROVENTIL,VENTOLIN) 90 MCG/ACT inhaler Inhale 2 puffs into the lungs every 6 (six) hours as needed for wheezing or shortness of breath.    . ALPRAZolam (XANAX) 0.25 MG tablet Take 1 tablet (0.25 mg total) by mouth 2 (two) times daily as needed for anxiety. 10 tablet 0  . budesonide-formoterol (SYMBICORT) 160-4.5 MCG/ACT inhaler Inhale 2 puffs into the lungs 2 (two) times daily. (Patient taking differently: Inhale 2 puffs into the lungs 2 (two) times daily as needed (shortness of breath). )    . dexamethasone (DECADRON) 4 MG tablet Take 1 tablet (4 mg total) by mouth 2 (two) times daily. For 5 days total. 10 tablet 0  . docusate sodium (COLACE) 100 MG capsule Take 100 mg by mouth at bedtime as needed for mild constipation.    . famotidine (PEPCID) 20 MG tablet Take 20 mg by mouth at bedtime.    . fluticasone (FLONASE) 50 MCG/ACT nasal spray USE 2 SPRAYS IN EACH NOSTRIL AT NIGHT  5  . gabapentin (NEURONTIN) 100 MG capsule TAKE 1 CAPS 3 TIMES DAY X5DAY, 2 CAPS 3 TIMES DAY X5DAYS, 3 CAPS 3 TIMES DAY TIL DIRECTED OTHERWISE 120 capsule 0  . gabapentin (NEURONTIN) 100 MG capsule Take 300 mg by mouth 3 (three) times daily.    [name] lactulose (CHRONULAC) 10 GM/15ML solution Take 15 mLs by mouth 2 (two) times daily as needed. constipaton  1  . mirtazapine (REMERON) 30 MG tablet Take 1 tablet (30 mg total) by mouth at bedtime. 30 tablet 2  . Multiple Vitamin (MULTIVITAMIN WITH MINERALS) TABS tablet Take 1 tablet by mouth daily.    . nitroGLYCERIN (NITROSTAT) 0.4 MG SL tablet Place 0.4 mg under the tongue every 5 (five) minutes as needed for chest pain (MAX 3 TABLETS).    [name] omeprazole (PRILOSEC) 40 MG capsule Take 40 mg by mouth daily before supper.   0  . oxyCODONE (OXY IR/ROXICODONE) 5 MG immediate release tablet Take 1 tablet (5 mg total) by mouth every 6 (six) hours as needed for severe pain. 10 tablet 0  . rivaroxaban (XARELTO) 20 MG TABS tablet Take 1 tablet (20 mg total)  by mouth daily with supper. 30 tablet 2  . senna (SENOKOT) 8.6 MG tablet Take 1 tablet by mouth at bedtime as needed for constipation.    . Sennosides (EX-LAX) 15 MG CHEW Chew 15 mg by mouth at bedtime as needed (constipation).     . tamsulosin (FLOMAX) 0.4 MG CAPS capsule Take 0.4 mg by mouth at bedtime.   2  . naproxen sodium (ANAPROX) 220 MG tablet Take 440 mg by mouth 2 (two) times daily as needed (pain).    . prochlorperazine (COMPAZINE) 10 MG tablet Take 10 mg by mouth every 6 (six) hours as needed for nausea or vomiting.     No current facility-administered medications for this visit.  Facility-Administered Medications Ordered in Other Visits  Medication Dose Route Frequency Provider Last Rate Last Dose  . heparin lock flush 100 unit/mL  500 Units Intracatheter Daily PRN Adrena E Johnson, PA-C      . sodium chloride 0.9 % injection 10 mL  10 mL Intracatheter PRN Adrena E Johnson, PA-C      . sodium chloride 0.9 % injection 3 mL  3 mL Intracatheter PRN Carlton Adam, PA-C        SURGICAL HISTORY:  Past Surgical History  Procedure Laterality Date  . Ptca  2006  . Coronary stent placement    . Permanent pacemaker insertion Bilateral 02/26/13    MDT Adapta L implanted by Dr Rayann Heman for sick sinus syndrome  . Radioactive seed implant  05/04/2004    8000 cGy, Dr Danny Lawless Dr Reece Agar  . Elbow surgery Bilateral 1973  . Coronary angioplasty    . Mediastinoscopy N/A 01/28/2014    Procedure: MEDIASTINOSCOPY;  Surgeon: Melrose Nakayama, MD;  Location: [facility];  Service: Thoracic;  Laterality: N/A;  . Permanent pacemaker insertion N/A 02/26/2013    Procedure: PERMANENT PACEMAKER INSERTION;  Surgeon: [name], MD;  Location: [facility];  Service: Cardiovascular;  Laterality: N/A;  . Insert / replace / remove pacemaker      not removed  . Craniotomy N/A 12/04/2014    Procedure: CRANIOTOMY TUMOR EXCISION, LEFT;  Surgeon: [name], MD;  Location: [facility];  Service:  Neurosurgery;  Laterality: N/A;  CRANIOTOMY TUMOR EXCISION  . Application of cranial navigation  12/04/2014    Procedure: APPLICATION OF CRANIAL NAVIGATION;  Surgeon: [name], MD;  Location: [facility];  Service: Neurosurgery;;  . D[place] Dates without cardioversion N/A 05/06/2015    Procedure: TRANSESOPHAGEAL ECHOCARDIOGRAM (TEE);  Surgeon: [name], MD;  Location: [facility];  Service: Cardiovascular;  Laterality: N/A;    REVIEW OF SYSTEMS:  Constitutional: positive for fatigue Eyes: negative Ears, nose, mouth, throat, and face: negative Respiratory: negative Cardiovascular: negative Gastrointestinal: negative Genitourinary:negative Integument/breast: negative Hematologic/lymphatic: negative Musculoskeletal:positive for back pain Neurological: negative Behavioral/Psych: negative Endocrine: negative Allergic/Immunologic: negative   PHYSICAL EXAMINATION: General appearance: alert, cooperative, fatigued and no distress Head: Normocephalic, without obvious abnormality, atraumatic Neck: no adenopathy, no carotid bruit, supple, symmetrical, trachea midline and thyroid not enlarged, symmetric, no tenderness/mass/nodules Lymph nodes: Cervical, supraclavicular, and axillary nodes normal. Resp: clear to auscultation bilaterally Back: symmetric, no curvature. ROM normal. No CVA tenderness. Cardio: regular rate and rhythm, S1, S2 normal, no murmur, click, rub or gallop GI: soft, non-tender; bowel sounds normal; no masses,  no organomegaly Extremities: extremities normal, atraumatic, no cyanosis or edema Neurologic: Alert and oriented X 3, normal strength and tone. Normal symmetric reflexes. Normal coordination and gait  ECOG PERFORMANCE STATUS: 2 - Symptomatic, <50% confined to bed  Blood pressure 95/56, pulse 86, temperature 97.8 F (36.6 C), temperature source Oral, resp. rate 21, height 6' (1.829 m), weight 164 lb (74.39 kg), SpO2 94 %.  LABORATORY DATA: Lab Results  Component Value  Date   WBC 5.5 05/12/2015   HGB 8.8* 05/12/2015   HCT 28.9* 05/12/2015   MCV 86.9 05/12/2015   PLT 111* 05/12/2015      Chemistry      Component Value Date/Time   NA 141 05/07/2015 0535   NA 138 04/21/2015 1012   K 4.4 05/07/2015 0535   K 3.3* 04/21/2015 1012   CL 104 05/07/2015 0535   CO2 29 05/07/2015 0535   CO2 24 04/21/2015 1012  BUN 16 05/07/2015 0535   BUN 16.7 04/21/2015 1012   CREATININE 0.68 05/07/2015 0535   CREATININE 0.8 04/21/2015 1012      Component Value Date/Time   CALCIUM 9.3 05/07/2015 0535   CALCIUM 9.6 04/21/2015 1012   ALKPHOS 81 05/06/2015 0325   ALKPHOS 71 04/21/2015 1012   AST 18 05/06/2015 0325   AST 12 04/21/2015 1012   ALT 11* 05/06/2015 0325   ALT 12 04/21/2015 1012   BILITOT 0.7 05/06/2015 0325   BILITOT 0.99 04/21/2015 1012       RADIOGRAPHIC STUDIES: Dg Chest 1 View  05/05/2015  CLINICAL DATA:  Post right thoracentesis. EXAM: CHEST 1 VIEW COMPARISON:  05/05/2015 FINDINGS: Persistent right pleural effusion with probable consolidation in the right lung base causing tenting of the hemidiaphragm. Mild improvement of aeration in the right lung. Prominence of the right hilum with volume loss in the right upper lung suggests obstructing mass lesion. No pneumothorax. Left lung appears clear and expanded with mild hyperinflation. Heart size and pulmonary vascularity are normal for technique. Calcification of the aorta. Cardiac pacemaker. Degenerative changes in the spine and shoulders. IMPRESSION: Persistent right pleural effusion with mild improvement of aeration in the right lung post thoracentesis. No pneumothorax. The volume loss in the right upper lung with right hilar prominence suggesting obstructing mass lesion. Electronically Signed   By: Lucienne Capers M.D.   On: 05/05/2015 18:29   Dg Chest 2 View  05/05/2015  CLINICAL DATA:  Acute onset of worsening shortness of breath and generalized weakness. Initial encounter. EXAM: CHEST  2 VIEW  COMPARISON:  Chest radiograph performed 07/23/2014, and CT of the chest performed 03/27/2015 FINDINGS: A relatively large right-sided pleural effusion is noted, with underlying airspace opacification. Mild vascular congestion is noted. The left lung is grossly clear. No pneumothorax is seen. The cardiomediastinal silhouette is borderline normal in size. A pacemaker is noted at the left chest wall, with leads ending at the right atrium and right ventricle. No acute osseous abnormalities are seen. IMPRESSION: Increasing relatively large right-sided pleural effusion, with underlying airspace opacification. Mild vascular congestion noted. This is concerning for a malignant effusion, given the patient's known lung cancer. Underlying pneumonia cannot be entirely excluded, depending on the patient's symptoms. Electronically Signed   By: Garald Balding M.D.   On: 05/05/2015 06:36   Ct Angio Chest Pe W/cm &/or Wo Cm  05/05/2015  CLINICAL DATA:  Shortness of breath. Pleural effusion. History pulmonary embolism. EXAM: CT ANGIOGRAPHY CHEST WITH CONTRAST TECHNIQUE: Multidetector CT imaging of the chest was performed using the standard protocol during bolus administration of intravenous contrast. Multiplanar CT image reconstructions and MIPs were obtained to evaluate the vascular anatomy. CONTRAST:  156m OMNIPAQUE IOHEXOL 350 MG/ML SOLN COMPARISON:  03/27/2015 FINDINGS: THORACIC INLET/BODY WALL: Body wall edema and extensive venous collaterals, both intra and extra thoracic, secondary to chronic SVC obstruction. No axillary or supraclavicular adenopathy detected. MEDIASTINUM: No cardiomegaly or pericardial effusion. Dual-chamber pacer from the right. Suboptimal opacification of the pulmonary arterial tree due to SVC obstruction with bolus dispersion and intermittent motion. Right-sided pulmonary arteries are distorted and attenuated without central acute filling defect. Previous pulmonary embolism to the right lower lobe,  with peripheral thick walled appearance compatible with endothelialized clot. No convincing pulmonary embolism, limited in subsegmental arteries. No acute aortic finding. No progressive mediastinal adenopathy. LUNG WINDOWS: Extensive atelectasis and scarring around the right hilum with bronchial distortion. There is a progressive and large layering right pleural effusion without pleural nodularity or  thickening. No superimposed consolidation or edema. UPPER ABDOMEN: Stable bilateral adrenal nodularity, presumed metastases. No acute finding. OSSEOUS: No acute finding. Review of the MIP images confirms the above findings. IMPRESSION: 1. No convincing acute pulmonary embolism. 2. Partial re- cannulization of recent right lower lobe pulmonary embolism. 3. Chronic SVC obstruction which limits CTA. 4. Large and progressive layering right pleural effusion. Electronically Signed   By: Monte Fantasia M.D.   On: 05/05/2015 08:42   Dg Abd 2 Views  05/05/2015  CLINICAL DATA:  Acute onset of mid abdominal pain and constipation. Initial encounter. EXAM: ABDOMEN - 2 VIEW COMPARISON:  CT of the abdomen and pelvis performed 03/27/2015 FINDINGS: The visualized bowel gas pattern is unremarkable. Scattered air and stool filled loops of colon are seen; no abnormal dilatation of small bowel loops is seen to suggest small bowel obstruction. No free intra-abdominal air is identified, though evaluation for free air is limited on a single supine view. The visualized osseous structures are within normal limits; the sacroiliac joints are unremarkable in appearance. A large right-sided pleural effusion is partially imaged. Pacemaker leads are partially seen. An IVC filter is noted. Brachytherapy seeds are seen overlying the prostate bed. IMPRESSION: Unremarkable bowel gas pattern; no free intra-abdominal air seen. Small to moderate amount of stool noted in the colon. Electronically Signed   By: Garald Balding M.D.   On: 05/05/2015 06:38    US Thoracentesis Asp Pleural Space W/img Guide  05/05/2015  CLINICAL DATA:  79 year old male with a history of new right-sided pleural effusion. He has been referred for diagnostic and therapeutic thoracentesis. EXAM: ULTRASOUND GUIDED right THORACENTESIS COMPARISON:  CT 05/05/2015 PROCEDURE: An ultrasound guided thoracentesis was thoroughly discussed with the patient and questions answered. The benefits, risks, alternatives and complications were also discussed. The patient understands and wishes to proceed with the procedure. Written consent was obtained. Ultrasound was performed to localize and mark an adequate pocket of fluid in the right chest. The area was then prepped and draped in the normal sterile fashion. 1% Lidocaine was used for local anesthesia. Under ultrasound guidance a Safe-T-Centesis catheter was introduced. Thoracentesis was performed. The catheter was removed and a dressing applied. Patient tolerated the procedure well and remained hemodynamically stable throughout. No complications encountered. COMPLICATIONS: None FINDINGS: A total of approximately 650 cc of serosanguineous fluid was removed. A fluid sample wassent for laboratory analysis. IMPRESSION: Status post right-sided thoracentesis.  650 cc of fluid removed. Sample was sent to the lab. Signed, Dulcy Fanny. Earleen Newport, DO [facility] Electronically Signed   By: Corrie Mckusick D.O.   On: 05/05/2015 18:26    ASSESSMENT AND PLAN: this is a very pleasant 79 years old white male with stage IV non-small cell lung cancer, adenocarcinoma with positive PDL 1 expression, completed systemic chemotherapy with carboplatin and Alimta status post 6 cycles and tolerated his treatment fairly well.  He is currently on treatment with immunotherapy with Ketruda (pembrolizumab) status post 12 cycles.  The patient is feeling fine today and we will proceed with cycle #13 as a scheduled. 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 For the history of acute pulmonary embolism, the patient will continue on Xarelto as previously ordered. For history of COPD, I recommended for the patient to continue treatment with Decadron but we will reduce the dose to 4 mg by mouth daily. The patient would come back for follow-up visit in 3 weeks for reevaluation before the  next cycle of his treatment. He was advised to call if he has any concerning symptoms in the interval. The patient was advised to call immediately if he has any concerning symptoms in the interval. The patient voices understanding of current disease status and treatment options and is in agreement with the current care plan.  All questions were answered. The patient knows to call the clinic with any problems, questions or concerns. We can certainly see the patient much sooner if necessary.  Disclaimer: This note was dictated with voice recognition software. Similar sounding words can inadvertently be transcribed and may not be corrected upon review.

## 2015-05-13 ENCOUNTER — Ambulatory Visit: Payer: Medicare Other | Admitting: Physical Therapy

## 2015-05-14 ENCOUNTER — Encounter: Payer: Medicare Other | Admitting: Occupational Therapy

## 2015-05-14 NOTE — Therapy (Signed)
[facility] [address] [place], [place], [address] Phone: [phone]   Fax:  [phone]  Patient Details  Name: Nicholas Schmitt MRN: [id number] Date of Birth: [birth year] Referring Provider:  No ref. provider found  Encounter Date: 05/14/2015  Pt has recently been hospitalized and now in SNF facility, therefore will d/c O.T. episode of care. Pt's caregiver informed via telephone message. Please refer to renewal on 04/28/15 and last note on 04/30/15 for most recent updates.   Carey Bullocks, OTR/L 05/14/2015, 11:28 AM  [facility] [address] [place], [place], [address] Phone: [phone]   Fax:  [phone]

## 2015-05-19 ENCOUNTER — Encounter: Payer: Medicare Other | Admitting: Occupational Therapy

## 2015-05-19 ENCOUNTER — Other Ambulatory Visit: Payer: Medicare Other

## 2015-05-19 ENCOUNTER — Ambulatory Visit: Payer: Medicare Other | Admitting: Internal Medicine

## 2015-05-20 ENCOUNTER — Ambulatory Visit: Payer: Medicare Other | Admitting: Physical Therapy

## 2015-05-21 ENCOUNTER — Inpatient Hospital Stay (HOSPITAL_COMMUNITY)
Admission: EM | Admit: 2015-05-21 | Discharge: 2015-05-27 | DRG: 175 | Disposition: A | Discharge status: Skilled Nursing Facility | Payer: Medicare Other | Attending: Internal Medicine | Admitting: Internal Medicine

## 2015-05-21 ENCOUNTER — Other Ambulatory Visit: Payer: Self-pay | Admitting: Oncology

## 2015-05-21 ENCOUNTER — Emergency Department (HOSPITAL_COMMUNITY): Payer: Medicare Other

## 2015-05-21 ENCOUNTER — Encounter (HOSPITAL_COMMUNITY): Payer: Self-pay | Admitting: Emergency Medicine

## 2015-05-21 DIAGNOSIS — C3491 Malignant neoplasm of unspecified part of right bronchus or lung: Secondary | ICD-10-CM | POA: Diagnosis present

## 2015-05-21 DIAGNOSIS — Z950 Presence of cardiac pacemaker: Secondary | ICD-10-CM

## 2015-05-21 DIAGNOSIS — Z86718 Personal history of other venous thrombosis and embolism: Secondary | ICD-10-CM | POA: Diagnosis not present

## 2015-05-21 DIAGNOSIS — Z6821 Body mass index (BMI) 21.0-21.9, adult: Secondary | ICD-10-CM

## 2015-05-21 DIAGNOSIS — Z85828 Personal history of other malignant neoplasm of skin: Secondary | ICD-10-CM

## 2015-05-21 DIAGNOSIS — D151 Benign neoplasm of heart: Secondary | ICD-10-CM | POA: Diagnosis present

## 2015-05-21 DIAGNOSIS — E039 Hypothyroidism, unspecified: Secondary | ICD-10-CM | POA: Diagnosis present

## 2015-05-21 DIAGNOSIS — C7951 Secondary malignant neoplasm of bone: Secondary | ICD-10-CM | POA: Diagnosis present

## 2015-05-21 DIAGNOSIS — D7289 Other specified disorders of white blood cells: Secondary | ICD-10-CM | POA: Diagnosis present

## 2015-05-21 DIAGNOSIS — Z87891 Personal history of nicotine dependence: Secondary | ICD-10-CM

## 2015-05-21 DIAGNOSIS — I2699 Other pulmonary embolism without acute cor pulmonale: Secondary | ICD-10-CM | POA: Diagnosis present

## 2015-05-21 DIAGNOSIS — Z8546 Personal history of malignant neoplasm of prostate: Secondary | ICD-10-CM | POA: Diagnosis not present

## 2015-05-21 DIAGNOSIS — Z955 Presence of coronary angioplasty implant and graft: Secondary | ICD-10-CM

## 2015-05-21 DIAGNOSIS — R0602 Shortness of breath: Secondary | ICD-10-CM | POA: Diagnosis present

## 2015-05-21 DIAGNOSIS — D696 Thrombocytopenia, unspecified: Secondary | ICD-10-CM | POA: Diagnosis present

## 2015-05-21 DIAGNOSIS — J90 Pleural effusion, not elsewhere classified: Secondary | ICD-10-CM | POA: Diagnosis present

## 2015-05-21 DIAGNOSIS — Z923 Personal history of irradiation: Secondary | ICD-10-CM | POA: Diagnosis not present

## 2015-05-21 DIAGNOSIS — R000 Tachycardia, unspecified: Secondary | ICD-10-CM | POA: Diagnosis not present

## 2015-05-21 DIAGNOSIS — E785 Hyperlipidemia, unspecified: Secondary | ICD-10-CM | POA: Diagnosis present

## 2015-05-21 DIAGNOSIS — T451X5A Adverse effect of antineoplastic and immunosuppressive drugs, initial encounter: Secondary | ICD-10-CM | POA: Diagnosis present

## 2015-05-21 DIAGNOSIS — Z9889 Other specified postprocedural states: Secondary | ICD-10-CM | POA: Diagnosis present

## 2015-05-21 DIAGNOSIS — Z95828 Presence of other vascular implants and grafts: Secondary | ICD-10-CM

## 2015-05-21 DIAGNOSIS — I714 Abdominal aortic aneurysm, without rupture: Secondary | ICD-10-CM | POA: Diagnosis present

## 2015-05-21 DIAGNOSIS — I2782 Chronic pulmonary embolism: Secondary | ICD-10-CM | POA: Diagnosis present

## 2015-05-21 DIAGNOSIS — J440 Chronic obstructive pulmonary disease with acute lower respiratory infection: Secondary | ICD-10-CM | POA: Diagnosis present

## 2015-05-21 DIAGNOSIS — R55 Syncope and collapse: Secondary | ICD-10-CM | POA: Diagnosis not present

## 2015-05-21 DIAGNOSIS — Z79899 Other long term (current) drug therapy: Secondary | ICD-10-CM | POA: Diagnosis not present

## 2015-05-21 DIAGNOSIS — I10 Essential (primary) hypertension: Secondary | ICD-10-CM | POA: Diagnosis present

## 2015-05-21 DIAGNOSIS — R222 Localized swelling, mass and lump, trunk: Secondary | ICD-10-CM | POA: Diagnosis not present

## 2015-05-21 DIAGNOSIS — Z7952 Long term (current) use of systemic steroids: Secondary | ICD-10-CM

## 2015-05-21 DIAGNOSIS — C7931 Secondary malignant neoplasm of brain: Secondary | ICD-10-CM | POA: Diagnosis present

## 2015-05-21 DIAGNOSIS — I252 Old myocardial infarction: Secondary | ICD-10-CM | POA: Diagnosis not present

## 2015-05-21 DIAGNOSIS — Y95 Nosocomial condition: Secondary | ICD-10-CM | POA: Diagnosis present

## 2015-05-21 DIAGNOSIS — H9190 Unspecified hearing loss, unspecified ear: Secondary | ICD-10-CM | POA: Diagnosis present

## 2015-05-21 DIAGNOSIS — J189 Pneumonia, unspecified organism: Secondary | ICD-10-CM | POA: Diagnosis present

## 2015-05-21 DIAGNOSIS — I495 Sick sinus syndrome: Secondary | ICD-10-CM | POA: Diagnosis present

## 2015-05-21 DIAGNOSIS — I5189 Other ill-defined heart diseases: Secondary | ICD-10-CM | POA: Diagnosis present

## 2015-05-21 DIAGNOSIS — C61 Malignant neoplasm of prostate: Secondary | ICD-10-CM | POA: Diagnosis present

## 2015-05-21 DIAGNOSIS — I2510 Atherosclerotic heart disease of native coronary artery without angina pectoris: Secondary | ICD-10-CM | POA: Diagnosis present

## 2015-05-21 DIAGNOSIS — D6481 Anemia due to antineoplastic chemotherapy: Secondary | ICD-10-CM | POA: Diagnosis present

## 2015-05-21 DIAGNOSIS — J449 Chronic obstructive pulmonary disease, unspecified: Secondary | ICD-10-CM | POA: Diagnosis present

## 2015-05-21 DIAGNOSIS — I82B11 Acute embolism and thrombosis of right subclavian vein: Secondary | ICD-10-CM | POA: Diagnosis present

## 2015-05-21 DIAGNOSIS — E032 Hypothyroidism due to medicaments and other exogenous substances: Secondary | ICD-10-CM

## 2015-05-21 DIAGNOSIS — Z86711 Personal history of pulmonary embolism: Secondary | ICD-10-CM | POA: Diagnosis not present

## 2015-05-21 DIAGNOSIS — D72825 Bandemia: Secondary | ICD-10-CM

## 2015-05-21 DIAGNOSIS — Z825 Family history of asthma and other chronic lower respiratory diseases: Secondary | ICD-10-CM | POA: Diagnosis not present

## 2015-05-21 DIAGNOSIS — Z8249 Family history of ischemic heart disease and other diseases of the circulatory system: Secondary | ICD-10-CM

## 2015-05-21 DIAGNOSIS — D649 Anemia, unspecified: Secondary | ICD-10-CM | POA: Diagnosis not present

## 2015-05-21 DIAGNOSIS — Z66 Do not resuscitate: Secondary | ICD-10-CM | POA: Diagnosis present

## 2015-05-21 DIAGNOSIS — K219 Gastro-esophageal reflux disease without esophagitis: Secondary | ICD-10-CM | POA: Diagnosis present

## 2015-05-21 DIAGNOSIS — C3490 Malignant neoplasm of unspecified part of unspecified bronchus or lung: Secondary | ICD-10-CM | POA: Diagnosis present

## 2015-05-21 DIAGNOSIS — Z7901 Long term (current) use of anticoagulants: Secondary | ICD-10-CM | POA: Diagnosis not present

## 2015-05-21 LAB — CBC WITH DIFFERENTIAL/PLATELET
BASOS PCT: 0 %
BASOS PCT: 0 %
Basophils Absolute: 0 10*3/uL (ref 0.0–0.1)
Basophils Absolute: 0 10*3/uL (ref 0.0–0.1)
EOS ABS: 0.1 10*3/uL (ref 0.0–0.7)
EOS ABS: 0 10*3/uL (ref 0.0–0.7)
Eosinophils Relative: 1 %
Eosinophils Relative: 0 %
HCT: 31.4 % — ABNORMAL LOW (ref 39.0–52.0)
HCT: 28.8 % — ABNORMAL LOW (ref 39.0–52.0)
Hemoglobin: 9.4 g/dL — ABNORMAL LOW (ref 13.0–17.0)
Hemoglobin: 8.8 g/dL — ABNORMAL LOW (ref 13.0–17.0)
LYMPHS ABS: 0.4 10*3/uL — AB (ref 0.7–4.0)
Lymphocytes Relative: 7 %
Lymphocytes Relative: 7 %
Lymphs Abs: 0.6 10*3/uL — ABNORMAL LOW (ref 0.7–4.0)
MCH: 28.1 pg (ref 26.0–34.0)
MCH: 28.3 pg (ref 26.0–34.0)
MCHC: 29.9 g/dL — AB (ref 30.0–36.0)
MCHC: 30.6 g/dL (ref 30.0–36.0)
MCV: 93.7 fL (ref 78.0–100.0)
MCV: 92.6 fL (ref 78.0–100.0)
MONO ABS: 0.7 10*3/uL (ref 0.1–1.0)
MONO ABS: 0.3 10*3/uL (ref 0.1–1.0)
Monocytes Relative: 9 %
Monocytes Relative: 6 %
NEUTROS ABS: 5.1 10*3/uL (ref 1.7–7.7)
NEUTROS PCT: 83 %
NEUTROS PCT: 87 %
Neutro Abs: 6.7 10*3/uL (ref 1.7–7.7)
PLATELETS: 51 10*3/uL — AB (ref 150–400)
PLATELETS: 47 10*3/uL — AB (ref 150–400)
RBC: 3.35 MIL/uL — AB (ref 4.22–5.81)
RBC: 3.11 MIL/uL — AB (ref 4.22–5.81)
RDW: 24.5 % — AB (ref 11.5–15.5)
RDW: 24.3 % — ABNORMAL HIGH (ref 11.5–15.5)
WBC: 8.1 10*3/uL (ref 4.0–10.5)
WBC: 5.8 10*3/uL (ref 4.0–10.5)

## 2015-05-21 LAB — BASIC METABOLIC PANEL
Anion gap: 12 (ref 5–15)
BUN: 30 mg/dL — ABNORMAL HIGH (ref 6–20)
CALCIUM: 9.8 mg/dL (ref 8.9–10.3)
CO2: 25 mmol/L (ref 22–32)
CREATININE: 0.78 mg/dL (ref 0.61–1.24)
Chloride: 102 mmol/L (ref 101–111)
GFR calc non Af Amer: >60 mL/min (ref >60)
Glucose, Bld: 121 mg/dL — ABNORMAL HIGH (ref 65–99)
Potassium: 4.2 mmol/L (ref 3.5–5.1)
SODIUM: 139 mmol/L (ref 135–145)

## 2015-05-21 LAB — DIC (DISSEMINATED INTRAVASCULAR COAGULATION)PANEL
Prothrombin Time: 17.5 seconds — ABNORMAL HIGH (ref 11.6–15.2)
Smear Review: NONE SEEN
aPTT: 29 seconds (ref 24–37)

## 2015-05-21 LAB — DIC (DISSEMINATED INTRAVASCULAR COAGULATION) PANEL
FIBRINOGEN: 235 mg/dL (ref 204–475)
INR: 1.43 (ref 0.00–1.49)
PLATELETS: 47 10*3/uL — AB (ref 150–400)

## 2015-05-21 LAB — TYPE AND SCREEN
ABO/RH(D): O POS
Antibody Screen: NEGATIVE

## 2015-05-21 LAB — PROTIME-INR
INR: 1.73 — ABNORMAL HIGH (ref 0.00–1.49)
PROTHROMBIN TIME: 20.2 s — AB (ref 11.6–15.2)

## 2015-05-21 LAB — TROPONIN I: Troponin I: <0.03 ng/mL (ref <0.031)

## 2015-05-21 LAB — APTT: aPTT: 29 seconds (ref 24–37)

## 2015-05-21 LAB — BRAIN NATRIURETIC PEPTIDE: B Natriuretic Peptide: 79 pg/mL (ref 0.0–100.0)

## 2015-05-21 MED ORDER — ONDANSETRON HCL 4 MG PO TABS: 4.0000 mg | ORAL_TABLET | Freq: Four times a day (QID) | ORAL | Status: DC | PRN

## 2015-05-21 MED ORDER — MIRTAZAPINE 30 MG PO TABS
30.0000 mg | ORAL_TABLET | Freq: Every day | ORAL | Status: DC
  Administered 2015-05-21 – 2015-05-26 (×6): 30 mg via ORAL
  Filled 2015-05-21 (×6): qty 1

## 2015-05-21 MED ORDER — ALBUTEROL SULFATE (2.5 MG/3ML) 0.083% IN NEBU: 2.5000 mg | INHALATION_SOLUTION | Freq: Four times a day (QID) | RESPIRATORY_TRACT | Status: DC | PRN

## 2015-05-21 MED ORDER — ALPRAZOLAM 0.25 MG PO TABS: 0.2500 mg | ORAL_TABLET | Freq: Two times a day (BID) | ORAL | Status: DC | PRN

## 2015-05-21 MED ORDER — ALBUTEROL SULFATE HFA 108 (90 BASE) MCG/ACT IN AERS: 1.0000 | INHALATION_SPRAY | Freq: Four times a day (QID) | RESPIRATORY_TRACT | Status: DC | PRN

## 2015-05-21 MED ORDER — ACETAMINOPHEN 650 MG RE SUPP: 650.0000 mg | Freq: Four times a day (QID) | RECTAL | Status: DC | PRN

## 2015-05-21 MED ORDER — GABAPENTIN 300 MG PO CAPS
300.0000 mg | ORAL_CAPSULE | Freq: Three times a day (TID) | ORAL | Status: DC
  Administered 2015-05-21 – 2015-05-27 (×17): 300 mg via ORAL
  Filled 2015-05-21 (×19): qty 1

## 2015-05-21 MED ORDER — HEPARIN (PORCINE) IN NACL 100-0.45 UNIT/ML-% IJ SOLN
1300.0000 [IU]/h | INTRAMUSCULAR | Status: AC
  Administered 2015-05-21 – 2015-05-22 (×2): 1300 [IU]/h via INTRAVENOUS
  Filled 2015-05-21 (×3): qty 250

## 2015-05-21 MED ORDER — FLUTICASONE PROPIONATE 50 MCG/ACT NA SUSP
2.0000 | Freq: Every day | NASAL | Status: DC
Start: 2015-05-21 — End: 2015-05-27
  Administered 2015-05-21 – 2015-05-27 (×6): 2 via NASAL
  Filled 2015-05-21: qty 16

## 2015-05-21 MED ORDER — NITROGLYCERIN 0.4 MG SL SUBL: 0.4000 mg | SUBLINGUAL_TABLET | SUBLINGUAL | Status: DC | PRN

## 2015-05-21 MED ORDER — TAMSULOSIN HCL 0.4 MG PO CAPS
0.4000 mg | ORAL_CAPSULE | Freq: Every day | ORAL | Status: DC
  Administered 2015-05-21 – 2015-05-26 (×6): 0.4 mg via ORAL
  Filled 2015-05-21 (×6): qty 1

## 2015-05-21 MED ORDER — BUDESONIDE-FORMOTEROL FUMARATE 160-4.5 MCG/ACT IN AERO
2.0000 | INHALATION_SPRAY | Freq: Two times a day (BID) | RESPIRATORY_TRACT | Status: DC
Start: 2015-05-21 — End: 2015-05-27
  Administered 2015-05-21 – 2015-05-27 (×12): 2 via RESPIRATORY_TRACT
  Filled 2015-05-21: qty 6

## 2015-05-21 MED ORDER — OXYCODONE HCL 5 MG PO TABS: 5.0000 mg | ORAL_TABLET | Freq: Four times a day (QID) | ORAL | Status: DC | PRN

## 2015-05-21 MED ORDER — ONDANSETRON HCL 4 MG/2ML IJ SOLN: 4.0000 mg | Freq: Four times a day (QID) | INTRAMUSCULAR | Status: DC | PRN

## 2015-05-21 MED ORDER — LEVOTHYROXINE SODIUM 50 MCG PO TABS
50.0000 ug | ORAL_TABLET | Freq: Every day | ORAL | Status: DC
  Administered 2015-05-22 – 2015-05-27 (×6): 50 ug via ORAL
  Filled 2015-05-21 (×6): qty 1

## 2015-05-21 MED ORDER — DOCUSATE SODIUM 100 MG PO CAPS
100.0000 mg | ORAL_CAPSULE | Freq: Every evening | ORAL | Status: DC | PRN
  Administered 2015-05-23 – 2015-05-26 (×2): 100 mg via ORAL
  Filled 2015-05-21 (×2): qty 1

## 2015-05-21 MED ORDER — ACETAMINOPHEN 500 MG PO TABS
1000.0000 mg | ORAL_TABLET | Freq: Once | ORAL | Status: AC
  Administered 2015-05-21: 1000 mg via ORAL
  Filled 2015-05-21: qty 2

## 2015-05-21 MED ORDER — SODIUM CHLORIDE 0.9 % IJ SOLN
3.0000 mL | Freq: Two times a day (BID) | INTRAMUSCULAR | Status: DC
  Administered 2015-05-21 – 2015-05-27 (×10): 3 mL via INTRAVENOUS

## 2015-05-21 MED ORDER — SENNA 8.6 MG PO TABS
1.0000 | ORAL_TABLET | Freq: Every evening | ORAL | Status: DC | PRN
  Administered 2015-05-21 – 2015-05-23 (×2): 8.6 mg via ORAL
  Filled 2015-05-21 (×4): qty 1

## 2015-05-21 MED ORDER — IOHEXOL 350 MG/ML SOLN
100.0000 mL | Freq: Once | INTRAVENOUS | Status: AC | PRN
  Administered 2015-05-21: 100 mL via INTRAVENOUS

## 2015-05-21 MED ORDER — DEXAMETHASONE 4 MG PO TABS
4.0000 mg | ORAL_TABLET | Freq: Every day | ORAL | Status: DC
  Administered 2015-05-21 – 2015-05-27 (×7): 4 mg via ORAL
  Filled 2015-05-21 (×7): qty 1

## 2015-05-21 MED ORDER — ACETAMINOPHEN 325 MG PO TABS: 650.0000 mg | ORAL_TABLET | Freq: Four times a day (QID) | ORAL | Status: DC | PRN

## 2015-05-21 MED ORDER — FAMOTIDINE 20 MG PO TABS
20.0000 mg | ORAL_TABLET | Freq: Every day | ORAL | Status: DC
  Administered 2015-05-21 – 2015-05-26 (×6): 20 mg via ORAL
  Filled 2015-05-21 (×6): qty 1

## 2015-05-21 NOTE — ED Notes (Signed)
Bed: RESB Expected date:  Expected time:  Means of arrival:  Comments: EMS Lung CA

## 2015-05-21 NOTE — H&P (Signed)
[facility] History and Physical  Patient: Nicholas Schmitt  MRN: [id number]  DOB: 1936/06/05  DOS: the patient was seen and examined on 05/21/2015 PCP: [name], MD  Referring physician:  Dr little Chief Complaint: Shortness of breath  HPI: Nicholas Schmitt is a 79 y.o. 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 The patient is presenting with complaints of shortness of breath primarily worsening on laying flat associated with diaphoresis. He denies having any fever, cough, chest pain. He denies any recent change in his medication. He mentions he is compliant with his Xarelto. He also had some dizziness and lightheadedness along with these episodes. He denies any swelling in his upper extremity or legs. He denies any active bleeding fall trauma or injury. Patient was recently admitted in skilled nursing facility for short-term rehabilitation after his recent hospitalization for pleural effusion.  The patient is coming from skilled nursing facility At his baseline ambulates with support And is independent for most of his ADL; does not manages his medication on his own.  Review of Systems: as mentioned in the history of present illness.  A comprehensive review of the other systems is negative.  Past Medical History  Diagnosis Date  . CAD (coronary artery disease)     positive stress test in 2006 led to left heart cath (8/06) showing 95% prox RCA, 90% CFX, and 90% mLAD.  patient had a cypher DES to all 3 lesions  . Hyperlipidemia   . Hearing loss   . AAA (abdominal aortic aneurysm) ([place]) 11/09    3.2 cm   . Chronic renal insufficiency   . History of  radiation therapy 03/08/04- 04/08/04    prostate 4600 cGy in 3 fractions, radioactive seed implant 05/04/04  . Myocardial infarction ([place]) 1995  . Pacemaker 2014  . Shortness of breath     exertion  . COPD (chronic obstructive pulmonary disease) ([place])   . Radiation Jan.11-Jan 29/2016    Right central chest 35 gray in 14 fractions  . Radiation Jan.2016    35 gray in 14 fx lower lumbar/upper sacrum  . Diverticulosis   . Hemorrhoids   . Pneumonia     hx  . GERD (gastroesophageal reflux disease)   . Hypertension     no med now for 3-4 months  . Prostate cancer ([place]) 2005    gleason 7  . Skin cancer     ear  . Lung cancer ([place])   . Cancer [facility]) 2015    prostate   . Cancer [facility]) December 04, 2014    Cranial   . Cancer [facility]) 04/01/2015    spine   Past Surgical History  Procedure Laterality Date  . Ptca  2006  . Coronary stent placement    . Permanent pacemaker insertion Bilateral 02/26/13    MDT Adapta L implanted by Dr Rayann Heman for sick sinus syndrome  . Radioactive seed implant  05/04/2004    8000 cGy, Dr Danny Lawless Dr Reece Agar  . Elbow surgery Bilateral 1973  . Coronary angioplasty    . Mediastinoscopy N/A 01/28/2014    Procedure: MEDIASTINOSCOPY;  Surgeon: Melrose Nakayama, MD;  Location: [place];  Service: Thoracic;  Laterality: N/A;  . Permanent pacemaker insertion N/A 02/26/2013  Procedure: PERMANENT PACEMAKER INSERTION;  Surgeon: [name], MD;  Location: [place] CATH [facility];  Service: Cardiovascular;  Laterality: N/A;  . Insert / replace / remove pacemaker      not removed  . Craniotomy N/A 12/04/2014    Procedure: CRANIOTOMY TUMOR EXCISION, LEFT;  Surgeon: [name], MD;  Location: [place] [facility];  Service: Neurosurgery;  Laterality: N/A;  CRANIOTOMY TUMOR EXCISION  . Application of cranial navigation  12/04/2014    Procedure: APPLICATION OF CRANIAL NAVIGATION;  Surgeon: [name], MD;  Location: [facility];  Service: Neurosurgery;;  . Darden Dates without cardioversion N/A  05/06/2015    Procedure: TRANSESOPHAGEAL ECHOCARDIOGRAM (TEE);  Surgeon: [name], MD;  Location: [place];  Service: Cardiovascular;  Laterality: N/A;   Social History:  reports that he quit smoking about 21 years ago. His smoking use included Cigarettes. He has a 105 pack-year smoking history. He has never used smokeless tobacco. He reports that he does not drink alcohol or use illicit drugs.  No Known Allergies  Family History  Problem Relation Age of Onset  . Asthma Sister   . Asthma Brother   . Coronary artery disease Other     Prior to Admission medications   Medication Sig Start Date End Date Taking? Authorizing Provider  albuterol (PROVENTIL HFA;VENTOLIN HFA) 108 (90 BASE) MCG/ACT inhaler Inhale into the lungs every 6 (six) hours as needed for wheezing or shortness of breath.   Yes Historical Provider, MD  ALPRAZolam (XANAX) 0.25 MG tablet Take 1 tablet (0.25 mg total) by mouth 2 (two) times daily as needed for anxiety. 05/09/15  Yes [name], MD  budesonide-formoterol (SYMBICORT) 160-4.5 MCG/ACT inhaler Inhale 2 puffs into the lungs 2 (two) times daily. 07/25/14  Yes [name], MD  dexamethasone (DECADRON) 4 MG tablet Take 4 mg by mouth daily.   Yes Historical Provider, MD  docusate sodium (COLACE) 100 MG capsule Take 100 mg by mouth at bedtime as needed for mild constipation.   Yes Historical Provider, MD  famotidine (PEPCID) 20 MG tablet Take 20 mg by mouth at bedtime.   Yes Historical Provider, MD  fluticasone (FLONASE) 50 MCG/ACT nasal spray USE 2 SPRAYS IN EACH NOSTRIL AT NIGHT 03/10/15  Yes Historical Provider, MD  gabapentin (NEURONTIN) 300 MG capsule Take 300 mg by mouth 3 (three) times daily.   Yes Historical Provider, MD  lactulose (CHRONULAC) 10 GM/15ML solution Take 15 mLs by mouth 2 (two) times daily as needed. constipaton 04/06/15  Yes Historical Provider, MD  levothyroxine (SYNTHROID) 50 MCG tablet Take 1 tablet (50 mcg total) by mouth daily before  breakfast. 05/12/15  Yes Curt Bears, MD  mirtazapine (REMERON) 30 MG tablet Take 1 tablet (30 mg total) by mouth at bedtime. 03/31/15  Yes Curt Bears, MD  Multiple Vitamins-Minerals (THERA-M) TABS Take 1 tablet by mouth daily.   Yes Historical Provider, MD  naproxen sodium (ANAPROX) 220 MG tablet Take 440 mg by mouth 2 (two) times daily as needed (pain).   Yes Historical Provider, MD  nitroGLYCERIN (NITROSTAT) 0.4 MG SL tablet Place 0.4 mg under the tongue every 5 (five) minutes as needed for chest pain (MAX 3 TABLETS). 06/05/13  Yes Dorena Cookey, MD  omeprazole (PRILOSEC OTC) 20 MG tablet Take 40 mg by mouth daily. Before supper   Yes Historical Provider, MD  oxyCODONE (OXY IR/ROXICODONE) 5 MG immediate release tablet Take 1 tablet (5 mg total) by mouth every 6 (six) hours as needed for severe pain. 05/09/15  Yes [name], MD  prochlorperazine (COMPAZINE) 10 MG tablet Take 10 mg by mouth every 6 (six) hours as needed for nausea or vomiting.   Yes Historical Provider, MD  rivaroxaban (XARELTO) 20 MG TABS tablet Take 1 tablet (20 mg total) by mouth daily with supper. 03/31/15  Yes Curt Bears, MD  senna (SENOKOT) 8.6 MG tablet Take 1 tablet by mouth at bedtime as needed for constipation.   Yes Historical Provider, MD  tamsulosin (FLOMAX) 0.4 MG CAPS capsule Take 0.4 mg by mouth at bedtime.  10/09/14  Yes Historical Provider, MD  Witch Hazel (PREPARATION H EX) Apply 1 application topically every 6 (six) hours as needed. Apply to anorectal area every 6 hours as need   Yes Historical Provider, MD  dexamethasone (DECADRON) 4 MG tablet Take 1 tablet (4 mg total) by mouth 2 (two) times daily. For 5 days total. 04/13/15   [name], MD    Physical Exam: Filed Vitals:   05/21/15 1200 05/21/15 1353 05/21/15 1416 05/21/15 1600  BP: 117/77 107/62  116/75  Pulse: 86 75  71  Temp:    97.6 F (36.4 C)  TempSrc:    Oral  Resp: '28 28  23  '$ Weight:   74.39 kg (164 lb)   SpO2: 96% 98%   98%    General: Alert, Awake and Oriented to Time, Place and Person. Appear in mild distress Eyes: PERRL ENT: Oral Mucosa clear moist. Neck: no JVD Cardiovascular: S1 and S2 Present, no Murmur, Peripheral Pulses Present Respiratory: Bilateral Air entry equal and Decreased,  faint basal occasional  Crackles,  no wheezes Abdomen: Bowel Sound present, Soft and no tenderness Skin: no Rash Extremities: no Pedal edema, no calf tenderness Neurologic: Grossly no focal neuro deficit.  Labs on Admission:  CBC:  Recent Labs Lab 05/21/15 1103  WBC 8.1  NEUTROABS 6.7  HGB 9.4*  HCT 31.4*  MCV 93.7  PLT 51*    CMP     Component Value Date/Time   NA 139 05/21/2015 1103   NA 139 05/12/2015 0913   K 4.2 05/21/2015 1103   K 3.7 05/12/2015 0913   CL 102 05/21/2015 1103   CO2 25 05/21/2015 1103   CO2 27 05/12/2015 0913   GLUCOSE 121* 05/21/2015 1103   GLUCOSE 82 05/12/2015 0913   BUN 30* 05/21/2015 1103   BUN 14.9 05/12/2015 0913   CREATININE 0.78 05/21/2015 1103   CREATININE 0.7 05/12/2015 0913   CALCIUM 9.8 05/21/2015 1103   CALCIUM 9.2 05/12/2015 0913   PROT 6.0* 05/12/2015 0913   PROT 6.5 05/06/2015 0325   ALBUMIN 3.0* 05/12/2015 0913   ALBUMIN 3.0* 05/06/2015 0325   AST 14 05/12/2015 0913   AST 18 05/06/2015 0325   ALT 15 05/12/2015 0913   ALT 11* 05/06/2015 0325   ALKPHOS 69 05/12/2015 0913   ALKPHOS 81 05/06/2015 0325   BILITOT 0.69 05/12/2015 0913   BILITOT 0.7 05/06/2015 0325   GFRNONAA >60 05/21/2015 1103   GFRAA >60 05/21/2015 1103     Recent Labs Lab 05/21/15 1103  TROPONINI <0.03   BNP (last 3 results)  Recent Labs  07/21/14 1821 05/05/15 0556 05/21/15 1104  BNP 39.7 74.3 79.0    ProBNP (last 3 results) No results for input(s): PROBNP in the last 8760 hours.   Radiological Exams on Admission: Dg Chest 2 View  05/21/2015  CLINICAL DATA:  Shortness of breath with exertion EXAM: CHEST  2 VIEW COMPARISON:  05/05/2015 FINDINGS: Normal heart  size.  Dual-chamber pacer leads from the left are in unremarkable position. There is right perihilar fibrosis based on staging chest CT 03/27/2015. There is associated volume loss in the right upper lobe. Chronic elevation the right diaphragm with pleural effusion that is stable from 05/05/2015. Mild atelectasis or scarring at the left base. IMPRESSION: 1. No change from 05/05/2015 to suggest acute disease. 2. Right perihilar fibrosis and upper lobe atelectasis from lung cancer radiation. 3. Small right pleural effusion. Electronically Signed   By: Monte Fantasia M.D.   On: 05/21/2015 11:18   Ct Angio Chest Pe W/cm &/or Wo Cm  05/21/2015  CLINICAL DATA:  Short of breath exertion. COPD. Concern pulmonary embolism. Personal history of abdominal aortic aneurysm and lung cancer cells prostate cancer. EXAM: CT ANGIOGRAPHY CHEST WITH CONTRAST TECHNIQUE: Multidetector CT imaging of the chest was performed using the standard protocol during bolus administration of intravenous contrast. Multiplanar CT image reconstructions and MIPs were obtained to evaluate the vascular anatomy. CONTRAST:  176m OMNIPAQUE IOHEXOL 350 MG/ML SOLN COMPARISON:  CT thorax 05/05/2015 FINDINGS: Mediastinum/Nodes: There is extensive venous collaterals within the mediastinum related to occlusion of the superior vena cava. This occlusion is chronic and similar to CT of 05/05/2015. The extensive collateralization does limit timing and bolus concentration for evaluation of pulmonary embolism. Small focal filling defect within the LEFT lower lobe pulmonary artery (image 164 through 175 of series 10)extends a short distance into segmental pulmonary arteries. Potential small filling defect within a LEFT upper lobe pulmonary artery on image 95, series 10. The RIGHT pulmonary artery is constricted at the hilum (image 125, series 10). This is a chronic finding but no clear filling defects within the RIGHT pulmonary arteries. The RIGHT ventricular to LEFT  ventricular ratio is less than 0.9 (no evidence of RIGHT ventricular strain). Lungs/Pleura: There is perihilar consolidation and bronchiectasis on the RIGHT consists with radiation change. Moderate RIGHT effusion. No new nodularity. LEFT lung parenchyma is clear. No pulmonary infarction noted. Upper abdomen: Thickening of the LEFT adrenal gland 2.6 by 1.9 cm (image 22, series 10) this compares to 2.7 by 1.8 cm on most recent CT scan for no significant change. There is thickening of the esophagus above the carina (image 75, series 5 Musculoskeletal: No aggressive osseous lesion. Review of the MIP images confirms the above findings. IMPRESSION: 1. Acute pulmonary embolism within the LEFT lower lobe and probably within the LEFT upper lobe. The overall clot burden is a minimal. No CT evidence of RIGHT ventricular strain. 2. Chronic constriction of the RIGHT main pulmonary artery by a perihilar radiation change. 3. Chronic perihilar consolidation and bronchiectasis in the RIGHT hilum unchanged from prior consistent radiation change. 4. Moderate RIGHT effusion is improved compared to prior. 5. 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 6. Thickening esophagus above the carina and likely relates radiation change. 7. Stable thickening of the RIGHT adrenal gland. Critical results were conveyed to Dr. LTheotis Burrow on 05/21/2015 at 14 10 hours Electronically Signed   By: SSuzy BouchardM.D.   On: 05/21/2015 14:10   EKG: Independently reviewed. normal sinus rhythm, RBBB.  Assessment/Plan 1. Recurrent pulmonary embolism (HCC)  Thrombocytopenia  The patient is presenting with complaints of shortness of breath with dizziness. Due to his risk factor a CT PE protocol was performed in the ER and the CT scan is positive for acute left pulmonary embolism. Patient's prior pulmonary embolism was on the right pulmonary artery. Patient also had an IVC filter placed. Patient is compliant with his  Xarelto. And no recent  change in his medications reported. Does at present the patient would be deemed as failure XARELTO.  Patient also has thrombocytopenia without any active bleeding. Patient's immunotherapy does not cause thrombocytopenia as a significant adverse effect. Consumption due to acute embolism causing thrombocytopenia is more likely.  I discussed the case with Dr. Krista Blue who is the hemato-oncologist on call, who currently recommends to start the patient on heparin and recommends to check for DIC panel as well as HIT panel. Patient should be discharged on Lovenox lifelong at the time of discharge. Patient's primary oncologist is Dr. Julien Nordmann  Type and screen, DIC panel, HIT panel ordered, Heparin per pharmacy without bolus ordered Monitor on telemetry with continuous pulse oximetry Monitor for bleeding, daily CBC. Check Doppler of the upper extremity to rule out DVT, prior CAT scan shows SVC narrowing due to radiation  2. Left shift without specific infection. Patient's WBC shows mild left shift. I do not find any recent use of G-CSF injection. Patient denies having any fever chills cough or burning urination or diarrhea or constipation or abdominal pain or nausea or vomiting. CT scan shows chronic consolidation without any no acute finding.  continue to monitor the patient. have low threshold to start the patient on antibiotics. given his recent hospitalization patient will need broad-spectrum coverage.  3. COPD and history of radiation therapy. Stage IV Non-small cell lung cancer with brain metastasis.  Continuing Decadron  4. Hypothyroidism Continue Synthroid.  5. Atrial myxoma. Continue to monitor.  Nutrition: Regular diet   Advance goals of care discussion: DNR/DNI as per documentation as well as my discussion with the patient    Consults: Phone consultation with Dr. Jana Hakim  Family Communication: family was present at bedside, opportunity was given to ask question and all  questions were answered satisfactorily at the time of interview. Disposition: Admitted as inpatient, telemetry unit.  Author: Berle Mull, MD [facility] Pager: [phone] 05/21/2015  If 7PM-7AM, please contact night-coverage [email or web address] Password [email or web address]

## 2015-05-21 NOTE — ED Provider Notes (Addendum)
CSN: [id number]     Arrival date & time 05/21/15  0957 History   First MD Initiated Contact with Patient 05/21/15 1033     Chief Complaint  Patient presents with  . Shortness of Breath     (Consider location/radiation/quality/duration/timing/severity/associated sxs/prior Treatment) HPI Comments: Patient with extensive past medical history including CAD status post MI, CK D, non-small cell lung cancer stage IV who presents with shortness of breath. Pt has had several weeks of shortness of breath. He was admitted to the hospital in November for thoracentesis. He reports that he has had worsening shortness of breath with exertion recently. His shortness of breath is worse laying flat and he is not normally on oxygen. He denies any significant cough. No fevers or chest pain. No vomiting, abdominal pain, or blood in his stool.  The history is provided by the patient.    Past Medical History  Diagnosis Date  . CAD (coronary artery disease)     positive stress test in 2006 led to left heart cath (8/06) showing 95% prox RCA, 90% CFX, and 90% mLAD.  patient had a cypher DES to all 3 lesions  . Hyperlipidemia   . Hearing loss   . AAA (abdominal aortic aneurysm) ([place]) 11/09    3.2 cm   . Chronic renal insufficiency   . History of radiation therapy 03/08/04- 04/08/04    prostate 4600 cGy in 3 fractions, radioactive seed implant 05/04/04  . Myocardial infarction ([place]) 1995  . Pacemaker 2014  . Shortness of breath     exertion  . COPD (chronic obstructive pulmonary disease) ([place])   . Radiation Jan.11-Jan 29/2016    Right central chest 35 gray in 14 fractions  . Radiation Jan.2016    35 gray in 14 fx lower lumbar/upper sacrum  . Diverticulosis   . Hemorrhoids   . Pneumonia     hx  . GERD (gastroesophageal reflux disease)   . Hypertension     no med now for 3-4 months  . Prostate cancer ([place]) 2005    gleason 7  . Skin cancer     ear  . Lung cancer ([place])   . Cancer [facility]) 2015     prostate   . Cancer [facility]) December 04, 2014    Cranial   . Cancer [facility]) 04/01/2015    spine   Past Surgical History  Procedure Laterality Date  . Ptca  2006  . Coronary stent placement    . Permanent pacemaker insertion Bilateral 02/26/13    MDT Adapta L implanted by Dr Rayann Heman for sick sinus syndrome  . Radioactive seed implant  05/04/2004    8000 cGy, Dr Danny Lawless Dr Reece Agar  . Elbow surgery Bilateral 1973  . Coronary angioplasty    . Mediastinoscopy N/A 01/28/2014    Procedure: MEDIASTINOSCOPY;  Surgeon: Melrose Nakayama, MD;  Location: [place];  Service: Thoracic;  Laterality: N/A;  . Permanent pacemaker insertion N/A 02/26/2013    Procedure: PERMANENT PACEMAKER INSERTION;  Surgeon: [name], MD;  Location: [place] CATH LAB;  Service: Cardiovascular;  Laterality: N/A;  . Insert / replace / remove pacemaker      not removed  . Craniotomy N/A 12/04/2014    Procedure: CRANIOTOMY TUMOR EXCISION, LEFT;  Surgeon: [name], MD;  Location: [place] NEURO ORS;  Service: Neurosurgery;  Laterality: N/A;  CRANIOTOMY TUMOR EXCISION  . Application of cranial navigation  12/04/2014    Procedure: APPLICATION OF CRANIAL NAVIGATION;  Surgeon: [name], MD;  Location: [facility]  NEURO ORS;  Service: Neurosurgery;;  . Darden Dates without cardioversion N/A 05/06/2015    Procedure: TRANSESOPHAGEAL ECHOCARDIOGRAM (TEE);  Surgeon: [name], MD;  Location: [facility] ENDOSCOPY;  Service: Cardiovascular;  Laterality: N/A;   Family History  Problem Relation Age of Onset  . Asthma Sister   . Asthma Brother   . Coronary artery disease Other    Social History  Substance Use Topics  . Smoking status: Former Smoker -- 3.00 packs/day for 35 years    Types: Cigarettes    Quit date: 07/30/1993  . Smokeless tobacco: Never Used  . Alcohol Use: No     Comment: hx alcohol abuse per note dated 04/11/11    Review of Systems    Allergies  Review of patient's allergies indicates no known allergies.  Home Medications   Prior  to Admission medications   Medication Sig Start Date End Date Taking? Authorizing Provider  albuterol (PROVENTIL HFA;VENTOLIN HFA) 108 (90 BASE) MCG/ACT inhaler Inhale into the lungs every 6 (six) hours as needed for wheezing or shortness of breath.   Yes Historical Provider, MD  ALPRAZolam (XANAX) 0.25 MG tablet Take 1 tablet (0.25 mg total) by mouth 2 (two) times daily as needed for anxiety. 05/09/15  Yes [name], MD  budesonide-formoterol (SYMBICORT) 160-4.5 MCG/ACT inhaler Inhale 2 puffs into the lungs 2 (two) times daily. 07/25/14  Yes [name], MD  dexamethasone (DECADRON) 4 MG tablet Take 4 mg by mouth daily.   Yes Historical Provider, MD  docusate sodium (COLACE) 100 MG capsule Take 100 mg by mouth at bedtime as needed for mild constipation.   Yes Historical Provider, MD  famotidine (PEPCID) 20 MG tablet Take 20 mg by mouth at bedtime.   Yes Historical Provider, MD  fluticasone (FLONASE) 50 MCG/ACT nasal spray USE 2 SPRAYS IN EACH NOSTRIL AT NIGHT 03/10/15  Yes Historical Provider, MD  gabapentin (NEURONTIN) 300 MG capsule Take 300 mg by mouth 3 (three) times daily.   Yes Historical Provider, MD  lactulose (CHRONULAC) 10 GM/15ML solution Take 15 mLs by mouth 2 (two) times daily as needed. constipaton 04/06/15  Yes Historical Provider, MD  levothyroxine (SYNTHROID) 50 MCG tablet Take 1 tablet (50 mcg total) by mouth daily before breakfast. 05/12/15  Yes Curt Bears, MD  mirtazapine (REMERON) 30 MG tablet Take 1 tablet (30 mg total) by mouth at bedtime. 03/31/15  Yes Curt Bears, MD  Multiple Vitamins-Minerals (THERA-M) TABS Take 1 tablet by mouth daily.   Yes Historical Provider, MD  naproxen sodium (ANAPROX) 220 MG tablet Take 440 mg by mouth 2 (two) times daily as needed (pain).   Yes Historical Provider, MD  nitroGLYCERIN (NITROSTAT) 0.4 MG SL tablet Place 0.4 mg under the tongue every 5 (five) minutes as needed for chest pain (MAX 3 TABLETS). 06/05/13  Yes Dorena Cookey,  MD  omeprazole (PRILOSEC OTC) 20 MG tablet Take 40 mg by mouth daily. Before supper   Yes Historical Provider, MD  oxyCODONE (OXY IR/ROXICODONE) 5 MG immediate release tablet Take 1 tablet (5 mg total) by mouth every 6 (six) hours as needed for severe pain. 05/09/15  Yes [name], MD  prochlorperazine (COMPAZINE) 10 MG tablet Take 10 mg by mouth every 6 (six) hours as needed for nausea or vomiting.   Yes Historical Provider, MD  rivaroxaban (XARELTO) 20 MG TABS tablet Take 1 tablet (20 mg total) by mouth daily with supper. 03/31/15  Yes Curt Bears, MD  senna (SENOKOT) 8.6 MG tablet Take 1 tablet by  mouth at bedtime as needed for constipation.   Yes Historical Provider, MD  tamsulosin (FLOMAX) 0.4 MG CAPS capsule Take 0.4 mg by mouth at bedtime.  10/09/14  Yes Historical Provider, MD  Witch Hazel (PREPARATION H EX) Apply 1 application topically every 6 (six) hours as needed. Apply to anorectal area every 6 hours as need   Yes Historical Provider, MD  dexamethasone (DECADRON) 4 MG tablet Take 1 tablet (4 mg total) by mouth 2 (two) times daily. For 5 days total. 04/13/15   [name], MD   BP 107/62 mmHg  Pulse 75  Temp(Src) 98 F (36.7 C) (Oral)  Resp 28  Wt 164 lb (74.39 kg)  SpO2 98% Physical Exam  Constitutional: He is oriented to person, place, and time. He appears well-developed and well-nourished. No distress.  HENT:  Head: Normocephalic and atraumatic.  Moist mucous membranes  Eyes: Conjunctivae are normal. Pupils are equal, round, and reactive to light.  Neck: Neck supple.  Cardiovascular: Normal rate, regular rhythm and normal heart sounds.   No murmur heard. Pulmonary/Chest: Effort normal.  Coarse breath sounds, diminished in bases bilaterally  Abdominal: Soft. Bowel sounds are normal. He exhibits no distension. There is no tenderness.  Musculoskeletal: He exhibits no edema.  Neurological: He is alert and oriented to person, place, and time.  Fluent speech  Skin:  Skin is warm and dry.  Psychiatric: He has a normal mood and affect. Judgment normal.  Nursing note and vitals reviewed.   ED Course  .Critical Care Performed by: Sharlett Iles Authorized by: Sharlett Iles Total critical care time: 30 minutes Critical care time was exclusive of separately billable procedures and treating other patients. Critical care was necessary to treat or prevent imminent or life-threatening deterioration of the following conditions: respiratory failure. Critical care was time spent personally by me on the following activities: development of treatment plan with patient or surrogate, discussions with consultants, evaluation of patient's response to treatment, examination of patient, obtaining history from patient or surrogate, ordering and performing treatments and interventions, ordering and review of laboratory studies, ordering and review of radiographic studies, pulse oximetry, re-evaluation of patient's condition and review of old charts.   (including critical care time) Labs Review Labs Reviewed  BASIC METABOLIC PANEL - Abnormal; Notable for the following:    Glucose, Bld 121 (*)    BUN 30 (*)    All other components within normal limits  CBC WITH DIFFERENTIAL/PLATELET - Abnormal; Notable for the following:    RBC 3.35 (*)    Hemoglobin 9.4 (*)    HCT 31.4 (*)    MCHC 29.9 (*)    RDW 24.5 (*)    Platelets 51 (*)    Lymphs Abs 0.6 (*)    All other components within normal limits  PROTIME-INR - Abnormal; Notable for the following:    Prothrombin Time 20.2 (*)    INR 1.73 (*)    All other components within normal limits  BRAIN NATRIURETIC PEPTIDE  TROPONIN I  APTT    Imaging Review Dg Chest 2 View  05/21/2015  CLINICAL DATA:  Shortness of breath with exertion EXAM: CHEST  2 VIEW COMPARISON:  05/05/2015 FINDINGS: Normal heart size. Dual-chamber pacer leads from the left are in unremarkable position. There is right perihilar fibrosis  based on staging chest CT 03/27/2015. There is associated volume loss in the right upper lobe. Chronic elevation the right diaphragm with pleural effusion that is stable from 05/05/2015. Mild atelectasis or scarring at  the left base. IMPRESSION: 1. No change from 05/05/2015 to suggest acute disease. 2. Right perihilar fibrosis and upper lobe atelectasis from lung cancer radiation. 3. Small right pleural effusion. Electronically Signed   By: Monte Fantasia M.D.   On: 05/21/2015 11:18   Ct Angio Chest Pe W/cm &/or Wo Cm  05/21/2015  CLINICAL DATA:  Short of breath exertion. COPD. Concern pulmonary embolism. Personal history of abdominal aortic aneurysm and lung cancer cells prostate cancer. EXAM: CT ANGIOGRAPHY CHEST WITH CONTRAST TECHNIQUE: Multidetector CT imaging of the chest was performed using the standard protocol during bolus administration of intravenous contrast. Multiplanar CT image reconstructions and MIPs were obtained to evaluate the vascular anatomy. CONTRAST:  169m OMNIPAQUE IOHEXOL 350 MG/ML SOLN COMPARISON:  CT thorax 05/05/2015 FINDINGS: Mediastinum/Nodes: There is extensive venous collaterals within the mediastinum related to occlusion of the superior vena cava. This occlusion is chronic and similar to CT of 05/05/2015. The extensive collateralization does limit timing and bolus concentration for evaluation of pulmonary embolism. Small focal filling defect within the LEFT lower lobe pulmonary artery (image 164 through 175 of series 10)extends a short distance into segmental pulmonary arteries. Potential small filling defect within a LEFT upper lobe pulmonary artery on image 95, series 10. The RIGHT pulmonary artery is constricted at the hilum (image 125, series 10). This is a chronic finding but no clear filling defects within the RIGHT pulmonary arteries. The RIGHT ventricular to LEFT ventricular ratio is less than 0.9 (no evidence of RIGHT ventricular strain). Lungs/Pleura: There is perihilar  consolidation and bronchiectasis on the RIGHT consists with radiation change. Moderate RIGHT effusion. No new nodularity. LEFT lung parenchyma is clear. No pulmonary infarction noted. Upper abdomen: Thickening of the LEFT adrenal gland 2.6 by 1.9 cm (image 22, series 10) this compares to 2.7 by 1.8 cm on most recent CT scan for no significant change. There is thickening of the esophagus above the carina (image 75, series 5 Musculoskeletal: No aggressive osseous lesion. Review of the MIP images confirms the above findings. IMPRESSION: 1. Acute pulmonary embolism within the LEFT lower lobe and probably within the LEFT upper lobe. The overall clot burden is a minimal. No CT evidence of RIGHT ventricular strain. 2. Chronic constriction of the RIGHT main pulmonary artery by a perihilar radiation change. 3. Chronic perihilar consolidation and bronchiectasis in the RIGHT hilum unchanged from prior consistent radiation change. 4. Moderate RIGHT effusion is improved compared to prior. 5. 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 6. Thickening esophagus above the carina and likely relates radiation change. 7. Stable thickening of the RIGHT adrenal gland. Critical results were conveyed to Dr. LTheotis Burrow on 05/21/2015 at 14 10 hours Electronically Signed   By: SSuzy BouchardM.D.   On: 05/21/2015 14:10   I have personally reviewed and evaluated these lab results as part of my medical decision-making.   EKG Interpretation   Date/Time:  Thursday May 21 2015 10:06:21 EST Ventricular Rate:  94 PR Interval:  163 QRS Duration: 145 QT Interval:  350 QTC Calculation: 438 R Axis:   62 Text Interpretation:  Sinus rhythm Right bundle branch block No  significant change since last tracing Confirmed by Serenah Mill MD, Masato Pettie  ([id number] on 05/21/2015 11:03:55 AM     Medications  acetaminophen (TYLENOL) tablet 1,000 mg (1,000 mg Oral Given 05/21/15 1159)  iohexol (OMNIPAQUE) 350 MG/ML injection 100 mL (100 mLs  Intravenous Contrast Given 05/21/15 1330)    MDM   Final diagnoses:  Other acute pulmonary embolism  without acute cor pulmonale (HCC)  thrombocytopenia  Patient with multiple medical problems including metastatic lung cancer who presents with worsening shortness of breath with exertion. On arrival, the patient was awake, alert, and in no acute distress. Vital signs notable for O2 sat 98% on 2 L nasal cannula. Diminished breath sounds but no obvious lung asymmetry on exam. EKG shows no change from previous. Obtained above labs which were notable for stable anemia, creatinine 0.78, INR 1.7. Troponin and BNP normal. Because of the patient's risk factors for PE, obtained CTA of the chest which showed an acute PE in the left lower lobe and possibly left upper lobe. No evidence of right heart strain and patient remains with stable vital signs on reexamination. The patient is on Xarelto and has IVC filter thus has failed his current anticoagulant therapy. I discussed admission with the patient and his family and discussed case with [facility] hospitalist. I held on starting heparin given patient's thrombocytopenia; Dr. Posey Pronto, hospitalist, will discuss with patient's oncologist and will initiate anticoagulation based on recommendations. Patient admitted to general medicine in stable condition.  Sharlett Iles, MD 05/21/15 [place], MD 05/21/15 [phone]

## 2015-05-21 NOTE — Progress Notes (Signed)
ANTICOAGULATION CONSULT NOTE - Initial Consult  Pharmacy Consult for Heparin Indication: pulmonary embolus  No Known Allergies  Patient Measurements: Weight: 164 lb (74.39 kg) Heparin Dosing Weight: 74 kg  Vital Signs: Temp: 97.6 F (36.4 C) (12/08 1600) Temp Source: Oral (12/08 1600) BP: 116/75 mmHg (12/08 1600) Pulse Rate: 71 (12/08 1600)  Labs:  Recent Labs  05/21/15 1103 05/21/15 1104  HGB 9.4*  --   HCT 31.4*  --   PLT 51*  --   APTT  --  29  LABPROT  --  20.2*  INR  --  1.73*  CREATININE 0.78  --   TROPONINI <0.03  --     Estimated Creatinine Clearance: 81.4 mL/min (by C-G formula based on Cr of 0.78).   Medical History: Past Medical History  Diagnosis Date  . CAD (coronary artery disease)     positive stress test in 2006 led to left heart cath (8/06) showing 95% prox RCA, 90% CFX, and 90% mLAD.  patient had a cypher DES to all 3 lesions  . Hyperlipidemia   . Hearing loss   . AAA (abdominal aortic aneurysm) ([place]) 11/09    3.2 cm   . Chronic renal insufficiency   . History of radiation therapy 03/08/04- 04/08/04    prostate 4600 cGy in 3 fractions, radioactive seed implant 05/04/04  . Myocardial infarction ([place]) 1995  . Pacemaker 2014  . Shortness of breath     exertion  . COPD (chronic obstructive pulmonary disease) ([place])   . Radiation Jan.11-Jan 29/2016    Right central chest 35 gray in 14 fractions  . Radiation Jan.2016    35 gray in 14 fx lower lumbar/upper sacrum  . Diverticulosis   . Hemorrhoids   . Pneumonia     hx  . GERD (gastroesophageal reflux disease)   . Hypertension     no med now for 3-4 months  . Prostate cancer ([place]) 2005    gleason 7  . Skin cancer     ear  . Lung cancer ([place])   . Cancer [facility]) 2015    prostate   . Cancer [facility]) December 04, 2014    Cranial   . Cancer [facility]) 04/01/2015    spine    Assessment: 76 yoM present to ED with SOB.  Extensive PMH includes lung cancer on pembrolizumab chemo, CAD, COPD, and DVT/PE  (RLL PE dx 03/2015) s/p IVC filter, and Xarelto anticoagulation.  CTA shows new Left-sided PE, minimal clot burden.  RPh initially discussed platelet count with EDP and was instructed to hold heparin gtt.  EDP has now discussed with Hematology who recommends starting heparin drip without bolus (d/t recent Xarelto), and checking HIT panel.  His last Xarelto dose 12/7 at 1700.  Today, 05/21/2015: SCr 0.78 with CrCl  81 ml/min Hgb 9.4 (increased from previous range 8.3-8.8) Plt 51, low and decreased (previous range 143-239) Baseline APTT 29  Goal of Therapy:  Heparin level 0.3-0.7 units/ml Monitor platelets by anticoagulation protocol: Yes   Plan:   No heparin bolus  Start heparin IV infusion at 1300 units/hr ([place] nomogram)  Heparin level and APTT 8 hours after starting  Expect heparin level to be elevated for several days   Daily heparin level and CBC  Continue to monitor H&H and platelets  Gretta Arab PharmD, BCPS Pager [phone] 05/21/2015 4:25 PM

## 2015-05-21 NOTE — ED Notes (Signed)
Per EMS-states SOB with exertion-states after he rests he is fine and respiratory status returns to normal-states this is not his norm-98 % on 2 L [place]

## 2015-05-22 ENCOUNTER — Ambulatory Visit (HOSPITAL_COMMUNITY)
Admission: RE | Admit: 2015-05-22 | Discharge: 2015-05-22 | Disposition: A | Discharge status: Home / Self Care | Payer: Medicare Other | Source: Ambulatory Visit | Attending: Radiation Oncology | Admitting: Radiation Oncology

## 2015-05-22 ENCOUNTER — Ambulatory Visit (HOSPITAL_COMMUNITY): Payer: Medicare Other

## 2015-05-22 DIAGNOSIS — I2699 Other pulmonary embolism without acute cor pulmonale: Secondary | ICD-10-CM

## 2015-05-22 DIAGNOSIS — Z950 Presence of cardiac pacemaker: Secondary | ICD-10-CM | POA: Insufficient documentation

## 2015-05-22 DIAGNOSIS — C7931 Secondary malignant neoplasm of brain: Secondary | ICD-10-CM | POA: Insufficient documentation

## 2015-05-22 DIAGNOSIS — C801 Malignant (primary) neoplasm, unspecified: Secondary | ICD-10-CM | POA: Insufficient documentation

## 2015-05-22 DIAGNOSIS — Z95828 Presence of other vascular implants and grafts: Secondary | ICD-10-CM

## 2015-05-22 LAB — COMPREHENSIVE METABOLIC PANEL
ALT: 31 U/L (ref 17–63)
AST: 24 U/L (ref 15–41)
Albumin: 3.5 g/dL (ref 3.5–5.0)
Alkaline Phosphatase: 71 U/L (ref 38–126)
Anion gap: 11 (ref 5–15)
BILIRUBIN TOTAL: 1.1 mg/dL (ref 0.3–1.2)
BUN: 23 mg/dL — AB (ref 6–20)
CO2: 23 mmol/L (ref 22–32)
Calcium: 9.2 mg/dL (ref 8.9–10.3)
Chloride: 103 mmol/L (ref 101–111)
Creatinine, Ser: 0.71 mg/dL (ref 0.61–1.24)
Glucose, Bld: 160 mg/dL — ABNORMAL HIGH (ref 65–99)
POTASSIUM: 4.3 mmol/L (ref 3.5–5.1)
Sodium: 137 mmol/L (ref 135–145)
TOTAL PROTEIN: 6.3 g/dL — AB (ref 6.5–8.1)

## 2015-05-22 LAB — APTT
aPTT: 75 seconds — ABNORMAL HIGH (ref 24–37)
aPTT: 81 seconds — ABNORMAL HIGH (ref 24–37)
aPTT: 77 seconds — ABNORMAL HIGH (ref 24–37)

## 2015-05-22 LAB — CBC WITH DIFFERENTIAL/PLATELET
BASOS PCT: 0 %
Basophils Absolute: 0 10*3/uL (ref 0.0–0.1)
EOS PCT: 0 %
Eosinophils Absolute: 0 10*3/uL (ref 0.0–0.7)
HEMATOCRIT: 28.4 % — AB (ref 39.0–52.0)
Hemoglobin: 8.6 g/dL — ABNORMAL LOW (ref 13.0–17.0)
LYMPHS ABS: 0.5 10*3/uL — AB (ref 0.7–4.0)
Lymphocytes Relative: 9 %
MCH: 28.5 pg (ref 26.0–34.0)
MCHC: 30.3 g/dL (ref 30.0–36.0)
MCV: 94 fL (ref 78.0–100.0)
MONOS PCT: 7 %
Monocytes Absolute: 0.4 10*3/uL (ref 0.1–1.0)
NEUTROS ABS: 4.9 10*3/uL (ref 1.7–7.7)
Neutrophils Relative %: 84 %
Platelets: 53 10*3/uL — ABNORMAL LOW (ref 150–400)
RBC: 3.02 MIL/uL — AB (ref 4.22–5.81)
RDW: 24.8 % — AB (ref 11.5–15.5)
WBC: 5.8 10*3/uL (ref 4.0–10.5)

## 2015-05-22 LAB — DIFFERENTIAL
Basophils Absolute: 0 10*3/uL (ref 0.0–0.1)
Basophils Relative: 0 %
EOS ABS: 0 10*3/uL (ref 0.0–0.7)
EOS PCT: 0 %
LYMPHS ABS: 0.4 10*3/uL — AB (ref 0.7–4.0)
LYMPHS PCT: 7 %
MONO ABS: 0.4 10*3/uL (ref 0.1–1.0)
MONOS PCT: 7 %
Neutro Abs: 4.9 10*3/uL (ref 1.7–7.7)
Neutrophils Relative %: 86 %

## 2015-05-22 LAB — CBC
HEMATOCRIT: 28.3 % — AB (ref 39.0–52.0)
Hemoglobin: 8.6 g/dL — ABNORMAL LOW (ref 13.0–17.0)
MCH: 28.6 pg (ref 26.0–34.0)
MCHC: 30.4 g/dL (ref 30.0–36.0)
MCV: 94 fL (ref 78.0–100.0)
PLATELETS: 44 10*3/uL — AB (ref 150–400)
RBC: 3.01 MIL/uL — ABNORMAL LOW (ref 4.22–5.81)
RDW: 24.7 % — AB (ref 11.5–15.5)
WBC: 5.4 10*3/uL (ref 4.0–10.5)

## 2015-05-22 LAB — HEPARIN LEVEL (UNFRACTIONATED)
Heparin Unfractionated: 0.88 IU/mL — ABNORMAL HIGH (ref 0.30–0.70)
Heparin Unfractionated: 0.72 IU/mL — ABNORMAL HIGH (ref 0.30–0.70)

## 2015-05-22 MED ORDER — IOHEXOL 300 MG/ML  SOLN
80.0000 mL | Freq: Once | INTRAMUSCULAR | Status: AC | PRN
  Administered 2015-05-22: 75 mL via INTRAVENOUS

## 2015-05-22 MED ORDER — GUAIFENESIN ER 600 MG PO TB12
600.0000 mg | ORAL_TABLET | Freq: Two times a day (BID) | ORAL | Status: DC
  Administered 2015-05-22 – 2015-05-27 (×11): 600 mg via ORAL
  Filled 2015-05-22 (×11): qty 1

## 2015-05-22 MED ORDER — BENZONATATE 100 MG PO CAPS
100.0000 mg | ORAL_CAPSULE | Freq: Three times a day (TID) | ORAL | Status: DC | PRN
  Administered 2015-05-23: 100 mg via ORAL
  Filled 2015-05-22: qty 1

## 2015-05-22 NOTE — Clinical Social Work Note (Signed)
Clinical Social Work Assessment  Patient Details  Name: Nicholas Schmitt MRN: [id number] Date of Birth: 02-21-1936  Date of referral:  05/21/15               Reason for consult:  Facility Placement, Discharge Planning                Permission sought to share information with:    Permission granted to share information::     Name::        Agency::     Relationship::     Contact Information:     Housing/Transportation Living arrangements for the past 2 months:  [place] of Information:  Adult Children Nicholas Schmitt) Patient Interpreter Needed:  None Criminal Activity/Legal Involvement Pertinent to Current Situation/Hospitalization:  No - Comment as needed Significant Relationships:  Adult Children, Friend Lives with:  Facility Resident Do you feel safe going back to the place where you live?  Yes Need for family participation in patient care:  Yes (Comment)  Care giving concerns:  Pt lives alone.  He was in STR for 2 weeks and was scheduled to be discharged home but instead was hospitalized again.  Son is concerned that insurance may not reauthorize STR.   Social Worker assessment / plan:  Pt is currently disoriented and unable to complete assessment.  Pt does not have a HCPOA.  CSW spoke to pt's son Nicholas Schmitt who explained that pt was hospitalized in November and discharged to [name].  Pt was in STR for 2 weeks.  Previously, pt lived at home alone and his fiance who lives in the same neighborhood provided assistance with meals and daily living activities.  Family would like for pt to return to [name].  Employment status:  Retired Nurse, adult PT Recommendations:  Not assessed at this time Information / Referral to community resources:  [place]  Patient/Family's Response to care:  Son was appreciative of CSW assistance.  Patient/Family's Understanding of and Emotional Response to Diagnosis, Current  Treatment, and Prognosis:  Pt has a strong support system.  His son expressed concern about pt being able to be authorized to return to SNF.  CSW explained that she would begin the authorization process once a PT evaluation is completed.  Emotional Assessment Appearance:  Appears stated age Attitude/Demeanor/Rapport:  Other (Disoriented) Affect (typically observed):  Other (Confused) Orientation:  Oriented to Self Alcohol / Substance use:  Not Applicable Psych involvement (Current and /or in the community):  No (Comment)  Discharge Needs  Concerns to be addressed:  Discharge Planning Concerns Readmission within the last 30 days:  Yes Current discharge risk:    Barriers to Discharge:  Continued Medical Work up   [place], [place] D, [place] 05/22/2015, 1:16 PM

## 2015-05-22 NOTE — Progress Notes (Signed)
Pharmacy Consult Note - IV Heparin Follow Up  Labs: aPTT 77 sec  A/P: aPTT continues to be at goal (66-102 sec) for 2nd lab check on current heparin rate of 1300 units/hr. Will recheck aPTT and heparin level tomorrow AM. No problems per RN with heparin line and no reported bleeding  Adrian Saran, PharmD, BCPS Pager [phone] 05/22/2015 4:36 PM

## 2015-05-22 NOTE — Progress Notes (Signed)
ANTICOAGULATION CONSULT NOTE - Follow Up Consult  Pharmacy Consult for Heparin Indication: pulmonary embolus  No Known Allergies  Patient Measurements: Height: 6' (182.9 cm) Weight: 166 lb 3.2 oz (75.388 kg) IBW/kg (Calculated) : 77.6 Heparin Dosing Weight:   Vital Signs: Temp: 97.7 F (36.5 C) (12/09 0503) Temp Source: Oral (12/09 0503) BP: 155/69 mmHg (12/09 0503) Pulse Rate: 67 (12/09 0503)  Labs:  Recent Labs  05/21/15 1103  05/21/15 1104 05/21/15 1628 05/22/15 0110 05/22/15 0930  HGB 9.4*  --   --  8.8* 8.6*  --   HCT 31.4*  --   --  28.8* 28.3*  --   PLT 51*  --   --  47*  47* 44*  --   APTT  --   < > 29 29 75* 81*  LABPROT  --   --  20.2* 17.5*  --   --   INR  --   --  1.73* 1.43  --   --   HEPARINUNFRC  --   --   --   --  0.88* 0.72*  CREATININE 0.78  --   --   --   --   --   TROPONINI <0.03  --   --   --   --   --   < > = values in this interval not displayed.  Estimated Creatinine Clearance: 82.5 mL/min (by C-G formula based on Cr of 0.78).   Medications:  Infusions:  . heparin 1,300 Units/hr (05/22/15 1032)    Assessment: 79 y.o. 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 The patient is presenting with complaints of shortness of breath primarily worsening on laying flat associated with diaphoresis. CT scan is positive for acute left pulmonary embolism. Patient's prior pulmonary embolism was on the right.   Patient with PTT at goal, while heparin level high.  Feel high heparin level is still effect of prior rivaroxaban.  PTT ordered with Heparin level until both correlate due to possible drug-lab interaction between rivaroxaban and anti-Xa  level (aka heparin level)   0930 aPTT 81, HL 0.72  No issues per RN   Goal of Therapy:  Heparin level 0.3-0.7 units/ml aPTT 66-102 seconds Monitor platelets by anticoagulation protocol: Yes   Plan:  Continue heparin drip at current rate Recheck  PTT at 1500 Daily heparin level  Dolly Rias RPh 05/22/2015, 11:14 AM Pager [phone]

## 2015-05-22 NOTE — Progress Notes (Signed)
ANTICOAGULATION CONSULT NOTE - Follow Up Consult  Pharmacy Consult for Heparin Indication: pulmonary embolus  No Known Allergies  Patient Measurements: Height: 6' (182.9 cm) Weight: 169 lb 8.5 oz (76.9 kg) IBW/kg (Calculated) : 77.6 Heparin Dosing Weight:   Vital Signs: Temp: 97.4 F (36.3 C) (12/08 2024) Temp Source: Oral (12/08 2024) BP: 128/69 mmHg (12/08 2024) Pulse Rate: 73 (12/08 2024)  Labs:  Recent Labs  05/21/15 1103 05/21/15 1104 05/21/15 1628 05/22/15 0110  HGB 9.4*  --  8.8* 8.6*  HCT 31.4*  --  28.8* 28.3*  PLT 51*  --  47*  47* 44*  APTT  --  29 29 75*  LABPROT  --  20.2* 17.5*  --   INR  --  1.73* 1.43  --   HEPARINUNFRC  --   --   --  0.88*  CREATININE 0.78  --   --   --   TROPONINI <0.03  --   --   --     Estimated Creatinine Clearance: 84.1 mL/min (by C-G formula based on Cr of 0.78).   Medications:  Infusions:  . heparin 1,300 Units/hr (05/21/15 1741)    Assessment: Patient with PTT at goal, while heparin level high.  Feel high heparin level is still effect of prior rivaroxaban.  PTT ordered with Heparin level until both correlate due to possible drug-lab interaction between rivaroxaban and anti-Xa level (aka heparin level) No heparin issues noted.  Goal of Therapy:  Heparin level 0.3-0.7 units/ml aPTT 66-102 seconds Monitor platelets by anticoagulation protocol: Yes   Plan:  Continue heparin drip at current rate Recheck level and PTT at 0900  Nicholas Schmitt, Shea Stakes [place]ford 05/22/2015,4:14 AM

## 2015-05-22 NOTE — NC FL2 (Signed)
[place] LEVEL OF CARE SCREENING TOOL     IDENTIFICATION  Patient Name: Nicholas Schmitt Birthdate: 1935/11/05 Sex: male Admission Date (Current Location): 05/21/2015  [facility] and [place] Number: Herbalist and Address:  [facility],  [place] [address] [place]      Provider Number: [id number]  Attending Physician Name and Address:  [name], MD  Relative Name and Phone Number:       Current Level of Care: Hospital Recommended Level of Care: Nursing Facility Prior Approval Number:    Date Approved/Denied:   PASRR Number: [id number]  Discharge Plan: SNF    Current Diagnoses: Patient Active Problem List   Diagnosis Date Noted  . Pulmonary embolism ([place]) 05/21/2015  . Recurrent pulmonary embolism ([place]) 05/21/2015  . H/O craniotomy 11/2014 05/21/2015  . Presence of IVC filter 05/21/2015  . Presence of permanent cardiac pacemaker 05/21/2015  . Thrombocytopenia ([place]) 05/21/2015  . Left shift without diagnosis of specific infection 05/21/2015  . Hypothyroidism 05/12/2015  . Malignant pleural effusion   . Atrial mass 05/09/2015  . Pleural effusion, right 05/07/2015  . Acute pulmonary embolism ([place])   . 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 DVT (deep venous thrombosis) ([place]) 01/17/2015  . Sensation of cold in leg 01/06/2015  . Pain of left leg 01/06/2015  . Metastasis to brain ([place]) 12/04/2014  . Brain metastasis ([place]) 11/17/2014  . Encounter for antineoplastic immunotherapy 11/17/2014  . Other pancytopenia ([place])   . Acute respiratory failure with hypoxia ([place])   . Antineoplastic chemotherapy induced pancytopenia ([place])   . SOB (shortness of breath) 07/21/2014  . CAP (community acquired pneumonia) 07/21/2014  . Neoplasm related pain 06/18/2014  . Anemia in neoplastic disease 06/04/2014  . Dyspnea 06/02/2014  . Acute right hip pain 05/26/2014  . Non-small cell carcinoma of lung, stage 4 ([place])  01/30/2014  . Nodule of right lung 01/21/2014  . Mediastinal adenopathy 01/21/2014  . Prostate cancer ([place])   . History of radiation therapy   . 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 Sick sinus syndrome ([place]) 04/16/2013  . Cardiac arrhythmia 07/12/2011  . COPD GOLD II 08/27/2010  . Abdominal aortic aneurysm ([place]) 05/14/2008  . ACTINIC KERATOSIS, HEAD 02/11/2008  . LOSS, CONDUCTIVE HEARING, BILATERAL 01/18/2007  . HLD (hyperlipidemia) 12/25/2006  . Essential hypertension 12/25/2006  . Coronary atherosclerosis 12/25/2006    Orientation RESPIRATION BLADDER Height & Weight    Self  O2 Continent 6' (182.9 cm) 166 lbs.  BEHAVIORAL SYMPTOMS/MOOD NEUROLOGICAL BOWEL NUTRITION STATUS      Continent Diet (heart and thin liquids)  AMBULATORY STATUS COMMUNICATION OF NEEDS Skin   Supervision Verbally Normal                       Personal Care Assistance Level of Assistance  Bathing, Feeding, Dressing Bathing Assistance: Limited assistance Feeding assistance: Limited assistance Dressing Assistance: Limited assistance     Functional Limitations Info  Hearing   Hearing Info: Impaired      SPECIAL CARE FACTORS FREQUENCY                       Contractures Contractures Info: Not present    Additional Factors Info  Code Status Code Status Info: DNR Allergies Info: No known           Current Medications (05/22/2015):  This is the current hospital active medication list  Current Facility-Administered Medications  Medication Dose Route Frequency Provider Last Rate Last Dose  . acetaminophen (TYLENOL) tablet 650 mg  650 mg Oral Q6H PRN [name], MD       Or  . acetaminophen (TYLENOL) suppository 650 mg  650 mg Rectal Q6H PRN [name], MD      . albuterol (PROVENTIL) (2.5 MG/3ML) 0.083% nebulizer solution 2.5 mg  2.5 mg Nebulization Q6H PRN [name], MD      . ALPRAZolam [name]) tablet 0.25 mg  0.25 mg Oral BID PRN [name], MD      . benzonatate (TESSALON) capsule 100 mg  100 mg Oral TID PRN [name], MD      . budesonide-formoterol (SYMBICORT) 160-4.5 MCG/ACT inhaler 2 puff  2 puff Inhalation BID [name], MD   2 puff at 05/22/15 0850  . dexamethasone (DECADRON) tablet 4 mg  4 mg Oral Daily [name], MD   4 mg at 05/22/15 1032  . docusate sodium (COLACE) capsule 100 mg  100 mg Oral QHS PRN [name], MD      . famotidine (PEPCID) tablet 20 mg  20 mg Oral QHS [name], MD   20 mg at 05/21/15 2101  . fluticasone (FLONASE) 50 MCG/ACT nasal spray 2 spray  2 spray Each Nare Daily [name], MD   2 spray at 05/22/15 1032  . gabapentin (NEURONTIN) capsule 300 mg  300 mg Oral TID [name], MD   300 mg at 05/22/15 1032  . guaiFENesin (MUCINEX) 12 hr tablet 600 mg  600 mg Oral BID [name], MD   600 mg at 05/22/15 1032  . heparin ADULT infusion 100 units/mL (25000 units/250 mL)  1,300 Units/hr Intravenous Continuous Randa Spike, RPH 13 mL/hr at 05/22/15 1032 1,300 Units/hr at 05/22/15 1032  . levothyroxine (SYNTHROID, LEVOTHROID) tablet 50 mcg  50 mcg Oral QAC breakfast [name], MD   50 mcg at 05/22/15 [phone]  . mirtazapine (REMERON) tablet 30 mg  30 mg Oral QHS [name], MD   30 mg at 05/21/15 2101  . nitroGLYCERIN (NITROSTAT) SL tablet 0.4 mg  0.4 mg Sublingual Q5 min PRN [name], MD      . ondansetron [facility]) tablet 4 mg  4 mg Oral Q6H PRN [name], MD       Or  . ondansetron [facility]) injection 4 mg  4 mg Intravenous Q6H PRN [name], MD      . oxyCODONE (Oxy IR/ROXICODONE) immediate release tablet 5 mg  5 mg Oral Q6H PRN [name], MD      . senna [facility]) tablet 8.6 mg  1 tablet Oral QHS PRN [name], MD   8.6 mg at 05/21/15 2100  . sodium chloride 0.9 % injection 3 mL  3 mL Intravenous Q12H [name], MD   3 mL at 05/22/15 1033  . tamsulosin (FLOMAX) capsule 0.4 mg  0.4 mg Oral QHS [name], MD   0.4 mg at  05/21/15 2101     Discharge Medications: Please see discharge summary for a list of discharge medications.  Relevant Imaging Results:  Relevant Lab Results:   Additional Information [id number]  Drema Balzarine D, [place]

## 2015-05-22 NOTE — Progress Notes (Signed)
Utilization review completed.  

## 2015-05-22 NOTE — Progress Notes (Signed)
VASCULAR LAB PRELIMINARY  PRELIMINARY  PRELIMINARY  PRELIMINARY  Bilateral upper extremity venous duplex completed.    Preliminary report:  Right:   Non occlusive DVT of indeterminate age noted in the subclavian vein.  No evidence of superficial thrombosis.  Left:  No evidence of DVT or superficial thrombosis.    Leightyn Cina, RVT 05/22/2015, 9:06 AM

## 2015-05-23 LAB — CBC
HCT: 27.1 % — ABNORMAL LOW (ref 39.0–52.0)
HEMOGLOBIN: 8.1 g/dL — AB (ref 13.0–17.0)
MCH: 27.9 pg (ref 26.0–34.0)
MCHC: 29.9 g/dL — ABNORMAL LOW (ref 30.0–36.0)
MCV: 93.4 fL (ref 78.0–100.0)
Platelets: 39 10*3/uL — ABNORMAL LOW (ref 150–400)
RBC: 2.9 MIL/uL — AB (ref 4.22–5.81)
RDW: 24.7 % — ABNORMAL HIGH (ref 11.5–15.5)
WBC: 5.1 10*3/uL (ref 4.0–10.5)

## 2015-05-23 LAB — APTT: APTT: 74 s — AB (ref 24–37)

## 2015-05-23 LAB — HEPARIN LEVEL (UNFRACTIONATED): HEPARIN UNFRACTIONATED: 0.52 [IU]/mL (ref 0.30–0.70)

## 2015-05-23 LAB — GLUCOSE, CAPILLARY: Glucose-Capillary: 87 mg/dL (ref 65–99)

## 2015-05-23 MED ORDER — ENOXAPARIN SODIUM 100 MG/ML ~~LOC~~ SOLN
90.0000 mg | Freq: Once | SUBCUTANEOUS | Status: AC
  Administered 2015-05-23: 90 mg via SUBCUTANEOUS
  Filled 2015-05-23: qty 1

## 2015-05-23 MED ORDER — ENOXAPARIN SODIUM 80 MG/0.8ML ~~LOC~~ SOLN
1.0000 mg/kg | Freq: Two times a day (BID) | SUBCUTANEOUS | Status: DC
  Administered 2015-05-24 – 2015-05-26 (×5): 75 mg via SUBCUTANEOUS
  Filled 2015-05-23 (×5): qty 0.8

## 2015-05-23 NOTE — Progress Notes (Signed)
[facility] Progress Note    Patient: Nicholas Schmitt    UXN:[id number]  DOB: 1935/12/16     DOA: 05/21/2015 Date of Service: the patient was seen and examined on 05/23/2015  Subjective: Patient denies any active bleeding. No fall no trauma no injury. No nausea no vomiting. Nutrition: able to tolerate oral diet Activity: bedridden at present Last BM: 05/23/2015  Assessment and Plan: 1. Recurrent pulmonary embolism [facility]) Discussed with hematology again. HIT panel pending. Platelet counts are stabilizing. Recommend to transition the patient to Lovenox. Pharmacy consulted. Monitor CBC. Upper extremity Doppler is positive for a new DVT on SVC.  2. thrombocytopenia. Appears to be consumptive due to acute embolism. DIC panel negative for any hemolysis thrombo-angiopathy, smear negative for schistocytes. Plan to continue close monitoring due to therapeutic anticoagulation.  3.  History of radiation therapy   Non-small cell carcinoma of lung, stage 4 ([place])   H/O craniotomy 11/2014 Continuing Decadron at present.   4 Hypothyroidism Continue Synthroid  5  Presence of permanent cardiac pacemaker No events telemetry  6.Left shift without diagnosis of specific infection No evidence of fever. Continue close monitoring for any evidence of infection Nutrition: Regular diet  Advance goals of care discussion: Full code  Brief Summary of Hospitalization:  HPI: As per the H and P dictated on admission, "patient presents with complains of shortness of breath, was found to be having acute on chronic pulmonary embolism" Daily update: 05/23/2015, patient transitioned to Lovenox Consultants: Phone consultation with hematology Procedures: None Antibiotics: Anti-infectives    None      Family Communication: no family was present at bedside, at the time of interview.   Disposition:  Expected discharge date: 05/24/2015 Barriers to safe discharge: Platelet count   Intake/Output  Summary (Last 24 hours) at 05/23/15 1930 Last data filed at 05/23/15 1826  Gross per 24 hour  Intake 1230.93 ml  Output    400 ml  Net 830.93 ml   Filed Weights   05/21/15 1649 05/22/15 0503 05/23/15 0439  Weight: 76.9 kg (169 lb 8.5 oz) 75.388 kg (166 lb 3.2 oz) 75.751 kg (167 lb)    Objective: Physical Exam: Filed Vitals:   05/22/15 2100 05/23/15 0439 05/23/15 0850 05/23/15 1450  BP: 106/50 137/72  111/68  Pulse: 69 65  95  Temp: 98.4 F (36.9 C) 97.5 F (36.4 C)  98 F (36.7 C)  TempSrc: Oral Oral  Oral  Resp: '20 20  20  '$ Height:      Weight:  75.751 kg (167 lb)    SpO2: 99% 93% 97% 94%     General: Appear in mild distress, no Rash; Oral Mucosa moist. Cardiovascular: S1 and S2 Present, no Murmur, no JVD Respiratory: Bilateral Air entry present and Clear to Auscultation, no Crackles, no wheezes Abdomen: Bowel Sound present, Soft and no tenderness Extremities: no Pedal edema, no calf tenderness Neurology: Grossly no focal neuro deficit.  Data Reviewed: CBC:  Recent Labs Lab 05/21/15 1103 05/21/15 1628 05/22/15 0110 05/22/15 1543 05/23/15 0540  WBC 8.1 5.8 5.4 5.8 5.1  NEUTROABS 6.7 5.1 4.9 4.9  --   HGB 9.4* 8.8* 8.6* 8.6* 8.1*  HCT 31.4* 28.8* 28.3* 28.4* 27.1*  MCV 93.7 92.6 94.0 94.0 93.4  PLT 51* 47*  47* 44* 53* 39*   Basic Metabolic Panel:  Recent Labs Lab 05/21/15 1103 05/22/15 1543  NA 139 137  K 4.2 4.3  CL 102 103  CO2 25 23  GLUCOSE 121* 160*  BUN 30*  23*  CREATININE 0.78 0.71  CALCIUM 9.8 9.2   Liver Function Tests:  Recent Labs Lab 05/22/15 1543  AST 24  ALT 31  ALKPHOS 71  BILITOT 1.1  PROT 6.3*  ALBUMIN 3.5   No results for input(s): LIPASE, AMYLASE in the last 168 hours. No results for input(s): AMMONIA in the last 168 hours.  Cardiac Enzymes:  Recent Labs Lab 05/21/15 1103  TROPONINI <0.03   BNP (last 3 results)  Recent Labs  07/21/14 1821 05/05/15 0556 05/21/15 1104  BNP 39.7 74.3 79.0    ProBNP  (last 3 results) No results for input(s): PROBNP in the last 8760 hours.   CBG:  Recent Labs Lab 05/23/15 0732  GLUCAP 87    No results found for this or any previous visit (from the past 240 hour(s)).   Studies: No results found.   Scheduled Meds: . budesonide-formoterol  2 puff Inhalation BID  . dexamethasone  4 mg Oral Daily  . [START ON 05/24/2015] enoxaparin (LOVENOX) injection  1 mg/kg Subcutaneous Q12H  . famotidine  20 mg Oral QHS  . fluticasone  2 spray Each Nare Daily  . gabapentin  300 mg Oral TID  . guaiFENesin  600 mg Oral BID  . levothyroxine  50 mcg Oral QAC breakfast  . mirtazapine  30 mg Oral QHS  . sodium chloride  3 mL Intravenous Q12H  . tamsulosin  0.4 mg Oral QHS   Continuous Infusions:  PRN Meds: acetaminophen **OR** acetaminophen, albuterol, ALPRAZolam, benzonatate, docusate sodium, nitroGLYCERIN, ondansetron **OR** ondansetron (ZOFRAN) IV, oxyCODONE, senna  Time spent: 30 minutes  Author: Berle Mull, MD [facility] Pager: [phone] 05/23/2015 7:30 PM  If 7PM-7AM, please contact night-coverage at [email or web address], password [facility]

## 2015-05-23 NOTE — Progress Notes (Signed)
ANTICOAGULATION CONSULT NOTE - Follow Up Consult  Pharmacy Consult for Heparin Indication: pulmonary embolus  No Known Allergies  Patient Measurements: Height: 6' (182.9 cm) Weight: 167 lb (75.751 kg) IBW/kg (Calculated) : 77.6 Heparin Dosing Weight:   Vital Signs: Temp: 97.5 F (36.4 C) (12/10 0439) Temp Source: Oral (12/10 0439) BP: 137/72 mmHg (12/10 0439) Pulse Rate: 65 (12/10 0439)  Labs:  Recent Labs  05/21/15 1103  05/21/15 1104 05/21/15 1628 05/22/15 0110 05/22/15 0930 05/22/15 1543 05/23/15 0540  HGB 9.4*  --   --  8.8* 8.6*  --  8.6* 8.1*  HCT 31.4*  --   --  28.8* 28.3*  --  28.4* 27.1*  PLT 51*  --   --  47*  47* 44*  --  53* 39*  APTT  --   < > 29 29 75* 81* 77* 74*  LABPROT  --   --  20.2* 17.5*  --   --   --   --   INR  --   --  1.73* 1.43  --   --   --   --   HEPARINUNFRC  --   --   --   --  0.88* 0.72*  --  0.52  CREATININE 0.78  --   --   --   --   --  0.71  --   TROPONINI <0.03  --   --   --   --   --   --   --   < > = values in this interval not displayed.  Estimated Creatinine Clearance: 82.9 mL/min (by C-G formula based on Cr of 0.71).   Medications:  Infusions:  . heparin 1,300 Units/hr (05/22/15 1032)    Assessment: Patient with PTT and heparin level at goal.  No heparin issues noted.  With PTT and heparin level correlating will use only heparin level going forward.  Goal of Therapy:  Heparin level 0.3-0.7 units/ml aPTT 66-102 seconds Monitor platelets by anticoagulation protocol: Yes   Plan:  Continue heparin drip at current rate Recheck level with AM labs  Tyler Deis, Shea Stakes [place] 05/23/2015,6:33 AM

## 2015-05-23 NOTE — Progress Notes (Addendum)
ANTICOAGULATION CONSULT NOTE - Follow Up Consult  Pharmacy Consult for IV heparin > Lovenox Indication: pulmonary embolus  No Known Allergies  Patient Measurements: Height: 6' (182.9 cm) Weight: 167 lb (75.751 kg) IBW/kg (Calculated) : 77.6  Vital Signs: Temp: 98 F (36.7 C) (12/10 1450) Temp Source: Oral (12/10 1450) BP: 111/68 mmHg (12/10 1450) Pulse Rate: 95 (12/10 1450)  Labs:  Recent Labs  05/21/15 1103  05/21/15 1104 05/21/15 1628 05/22/15 0110 05/22/15 0930 05/22/15 1543 05/23/15 0540  HGB 9.4*  --   --  8.8* 8.6*  --  8.6* 8.1*  HCT 31.4*  --   --  28.8* 28.3*  --  28.4* 27.1*  PLT 51*  --   --  47*  47* 44*  --  53* 39*  APTT  --   < > 29 29 75* 81* 77* 74*  LABPROT  --   --  20.2* 17.5*  --   --   --   --   INR  --   --  1.73* 1.43  --   --   --   --   HEPARINUNFRC  --   --   --   --  0.88* 0.72*  --  0.52  CREATININE 0.78  --   --   --   --   --  0.71  --   TROPONINI <0.03  --   --   --   --   --   --   --   < > = values in this interval not displayed.  Estimated Creatinine Clearance: 82.9 mL/min (by C-G formula based on Cr of 0.71).   Medications:  Scheduled:  . budesonide-formoterol  2 puff Inhalation BID  . dexamethasone  4 mg Oral Daily  . [START ON 05/24/2015] enoxaparin (LOVENOX) injection  1 mg/kg Subcutaneous Q12H  . famotidine  20 mg Oral QHS  . fluticasone  2 spray Each Nare Daily  . gabapentin  300 mg Oral TID  . guaiFENesin  600 mg Oral BID  . levothyroxine  50 mcg Oral QAC breakfast  . mirtazapine  30 mg Oral QHS  . sodium chloride  3 mL Intravenous Q12H  . tamsulosin  0.4 mg Oral QHS   Infusions:    Assessment: 79 y.o. male Nicholas Schmitt of stage IV NSCLC with mets to brain as well as spine, s/p craniotomy, radiotherapy, on immunotherapy, PE on Xarelto, s/p IVC filter, sick sinus syndrome w/ pacer, atrial myxoma, CAD.  Patient presents with complaints of SOB primarily worsening on laying flat associated with diaphoresis. CT scan is  positive for acute left PE (prior PE was on the right).   Baseline Plt wnl, but now with new thrombocytopenia. Seen by hematology who feels this is primarily consumptive from PE and recommends switching to Lovenox.  HIT/DIC panels have been sent.  Today, 05/23/2015:  CBC: Plt decreased to 39; Hgb low but stable. No active bleeding per RN  CrCl 83 ml/min  Previous anticoagulation: Xarelto 20 mg daily   Goal of Therapy: Anti-Xa level 0.6-1 units/ml 4hrs after LMWH dose given  Plan:  Turn off heparin drip  Lovenox 90 mg SQ given 1 hr after stopping heparin, then 75 mg SQ q12 hr  F/u SCr; CBC at least q72 hr while on Lovenox  Monitor for signs of bleeding or worsening thrombosis   Reuel Boom, PharmD, BCPS Pager: [phone] 05/23/2015, 4:04 PM

## 2015-05-23 NOTE — Progress Notes (Signed)
[facility] Progress Note    Patient: Nicholas Schmitt    URK:[id number]  DOB: 1935-11-07     DOA: 05/21/2015 Date of Service: the patient was seen and examined on 05/22/2015  Subjective: pt has been resting comfortably, does not have any acute complain, shortness of breath is unchanged. Nutrition: able to tolerate oral diet Activity: bedridden at present Last BM: 05/22/2015  Assessment and Plan: 1. Recurrent pulmonary embolism (HCC) Currently continue heparin infusion. Upper extremity Doppler is positive for a new DVT on SVC. As per discussion with hematology patient will be transitioned to Lovenox tomorrow. Continue to monitor platelets as well as follow-up on HIT panel.  2. thrombocytopenia. Appears to be consumptive due to acute embolism. DiC panel negative for any hemolysis thrombo-angiopathy, smear negative for schistocytes. Plan to continue close monitoring due to therapeutic anticoagulation.  3.  History of radiation therapy   Non-small cell carcinoma of lung, stage 4 ([place])   H/O craniotomy 11/2014 Continuing Decadron at present.   4 Hypothyroidism Continue Synthroid  5  Presence of permanent cardiac pacemaker No events telemetry  6.Left shift without diagnosis of specific infection No evidence of fever. Continue close monitoring for any evidence of infection Nutrition: Regular diet Advance goals of care discussion: DNR/DNI  Family Communication: no family was present at bedside, at the time of interview.   Disposition:  Expected discharge date:05/24/2015 Barriers to safe discharge: Hemoglobin and platelet count   05/21/15 1649 05/22/15 0503  Weight: 76.9 kg (169 lb 8.5 oz) 75.388 kg (166 lb 3.2 oz)    Objective: Physical Exam:  05/22/15 1504  BP: 119/71  Pulse: 84  Temp: 98 F (36.7 C)  TempSrc: Oral  Resp: 19  Height:   Weight:   SpO2: 97%     General: Appear in mild distress, no Rash; Oral Mucosa moist. Cardiovascular: S1 and S2  Present, no Murmur, no JVD Respiratory: Bilateral Air entry present and basal Crackles, no wheezes Abdomen: Bowel Sound present, Soft and no tenderness Extremities: no Pedal edema, no calf tenderness Neurology: Grossly no focal neuro deficit.  Data Reviewed: CBC:  Recent Labs Lab 05/21/15 1103 05/21/15 1628 05/22/15 0110 05/22/15 1543  WBC 8.1 5.8 5.4 5.8  NEUTROABS 6.7 5.1 4.9 4.9  HGB 9.4* 8.8* 8.6* 8.6*  HCT 31.4* 28.8* 28.3* 28.4*  MCV 93.7 92.6 94.0 94.0  PLT 51* 47*  47* 44* 53*   Basic Metabolic Panel:  Recent Labs Lab 05/21/15 1103 05/22/15 1543  NA 139 137  K 4.2 4.3  CL 102 103  CO2 25 23  GLUCOSE 121* 160*  BUN 30* 23*  CREATININE 0.78 0.71  CALCIUM 9.8 9.2   Liver Function Tests:  Recent Labs Lab 05/22/15 1543  AST 24  ALT 31  ALKPHOS 71  BILITOT 1.1  PROT 6.3*  ALBUMIN 3.5    Cardiac Enzymes:  Recent Labs Lab 05/21/15 1103  TROPONINI <0.03   BNP (last 3 results)  Recent Labs  07/21/14 1821 05/05/15 0556 05/21/15 1104  BNP 39.7 74.3 79.0    Studies: Ct Head W Wo Contrast  05/22/2015  CLINICAL DATA:  79 year old male with lung cancer metastatic to the brain status post tumor resection and stereotactic radiosurgery in June. Subsequent encounter. EXAM: CT HEAD WITHOUT AND WITH CONTRAST TECHNIQUE: Contiguous axial images were obtained from the base of the skull through the vertex without and with intravenous contrast CONTRAST:  60m OMNIPAQUE IOHEXOL 300 MG/ML  SOLN COMPARISON:  03/13/2015. FINDINGS: Sequelae of posterior left craniotomy is  stable. No acute or suspicious osseous lesion identified. Chronic dural calcifications. Visualized paranasal sinuses and mastoids are clear. No acute orbit or scalp soft tissue findings. Stable CT appearance of the left posterior hemisphere resection cavity with no regional mass effect. Ex vacuo enlargement of the left atrium. Following contrast, no abnormal enhancement identified. Stable and normal  gray-white matter differentiation elsewhere. No intracranial mass effect. No new intracranial enhancement. No acute intracranial hemorrhage identified. No cortically based acute infarct identified. Major intracranial vascular structures are enhancing. Calcified atherosclerosis at the skull base. IMPRESSION: 1. Stable and satisfactory post treatment CT appearance of the brain. 2.  No acute or metastatic intracranial abnormality. Electronically Signed   By: Genevie [name] M.D.   On: 05/22/2015 20:19     Scheduled Meds: . budesonide-formoterol  2 puff Inhalation BID  . dexamethasone  4 mg Oral Daily  . famotidine  20 mg Oral QHS  . fluticasone  2 spray Each Nare Daily  . gabapentin  300 mg Oral TID  . guaiFENesin  600 mg Oral BID  . levothyroxine  50 mcg Oral QAC breakfast  . mirtazapine  30 mg Oral QHS  . sodium chloride  3 mL Intravenous Q12H  . tamsulosin  0.4 mg Oral QHS   Continuous Infusions: . heparin 1,300 Units/hr (05/22/15 1032)   PRN Meds: acetaminophen **OR** acetaminophen, albuterol, ALPRAZolam, benzonatate, docusate sodium, nitroGLYCERIN, ondansetron **OR** ondansetron (ZOFRAN) IV, oxyCODONE, senna  Time spent: 30 minutes  Author: Berle Mull, MD [facility] Pager: [phone]  05/22/2015 10:15 AM  If 7PM-7AM, please contact night-coverage at [email or web address], password [facility]

## 2015-05-24 LAB — COMPREHENSIVE METABOLIC PANEL
ALBUMIN: 3.2 g/dL — AB (ref 3.5–5.0)
ALK PHOS: 67 U/L (ref 38–126)
ALT: 29 U/L (ref 17–63)
ANION GAP: 5 (ref 5–15)
AST: 17 U/L (ref 15–41)
BUN: 21 mg/dL — ABNORMAL HIGH (ref 6–20)
CALCIUM: 9 mg/dL (ref 8.9–10.3)
CHLORIDE: 103 mmol/L (ref 101–111)
CO2: 30 mmol/L (ref 22–32)
CREATININE: 0.72 mg/dL (ref 0.61–1.24)
GFR calc Af Amer: >60 mL/min (ref >60)
GFR calc non Af Amer: >60 mL/min (ref >60)
GLUCOSE: 82 mg/dL (ref 65–99)
Potassium: 3.8 mmol/L (ref 3.5–5.1)
SODIUM: 138 mmol/L (ref 135–145)
Total Bilirubin: 1.1 mg/dL (ref 0.3–1.2)
Total Protein: 5.8 g/dL — ABNORMAL LOW (ref 6.5–8.1)

## 2015-05-24 LAB — CBC WITH DIFFERENTIAL/PLATELET
BASOS ABS: 0 10*3/uL (ref 0.0–0.1)
BASOS PCT: 0 %
EOS PCT: 1 %
Eosinophils Absolute: 0 10*3/uL (ref 0.0–0.7)
HEMATOCRIT: 27.2 % — AB (ref 39.0–52.0)
Hemoglobin: 8.3 g/dL — ABNORMAL LOW (ref 13.0–17.0)
LYMPHS ABS: 0.6 10*3/uL — AB (ref 0.7–4.0)
LYMPHS PCT: 14 %
MCH: 28.7 pg (ref 26.0–34.0)
MCHC: 30.5 g/dL (ref 30.0–36.0)
MCV: 94.1 fL (ref 78.0–100.0)
MONO ABS: 0.5 10*3/uL (ref 0.1–1.0)
Monocytes Relative: 11 %
NEUTROS ABS: 3.1 10*3/uL (ref 1.7–7.7)
Neutrophils Relative %: 74 %
PLATELETS: 37 10*3/uL — AB (ref 150–400)
RBC: 2.89 MIL/uL — AB (ref 4.22–5.81)
RDW: 24.8 % — AB (ref 11.5–15.5)
WBC: 4.2 10*3/uL (ref 4.0–10.5)

## 2015-05-24 NOTE — Progress Notes (Signed)
[facility] Progress Note    Patient: Nicholas Schmitt    XTG:[id number]  DOB: Dec 28, 1935     DOA: 05/21/2015 Date of Service: the patient was seen and examined on 05/24/2015  Subjective: Patient has not been seen by physical therapy yet. Continues to deny any active bleeding able to tolerate oral diet. Denies having any complaints of chest pain or abdominal pain.  Nutrition: able to tolerate oral diet Activity: Ambulating in the room Last BM: 05/23/2015  Assessment and Plan: 1. Recurrent pulmonary embolism (HCC) Xarelto failure. Status post IVC filter 9/16  HIT panel pending. Likely will be presented on Monday. Platelet counts are 39 yesterday 37 today.  As per discussion with hematology on admission as well as 05/23/2015, patient was transition to Lovenox. Pharmacy consulted. Monitor CBC. Upper extremity Doppler is positive for a new DVT on SVC.  2. thrombocytopenia. Appears to be consumptive due to acute embolism. DIC panel negative for any hemolysis thrombo-angiopathy, smear negative for schistocytes. Plan to continue close monitoring due to therapeutic anticoagulation. If platelet continues to drop tomorrow, we'll consult hematology for formal consultation to assist in management of outpatient anticoagulation.  3.  History of radiation therapy   Non-small cell carcinoma of lung, stage 4 ([place])   H/O craniotomy 11/2014 Continuing Decadron at present. Physical therapy consulted for generalized weakness.   4 Hypothyroidism Continue Synthroid  5  Presence of permanent cardiac pacemaker No events telemetry  6.Left shift without diagnosis of specific infection No evidence of fever. Clinically continues to improve. Continue close monitoring for any evidence of infection Nutrition: Regular diet  Advance goals of care discussion: Full code  Brief Summary of Hospitalization:  HPI: As per the H and P dictated on admission, "patient presents with complains of shortness  of breath, was found to be having acute on chronic pulmonary embolism" Daily update: 05/23/2015, patient transitioned to Lovenox Consultants: Phone consultation with hematology Procedures: None Antibiotics: Anti-infectives    None      Family Communication: no family was present at bedside, at the time of interview.   Disposition:  Expected discharge date: 05/25/2015 Barriers to safe discharge: Platelet count   Intake/Output Summary (Last 24 hours) at 05/24/15 1051 Last data filed at 05/23/15 1826  Gross per 24 hour  Intake    704 ml  Output      0 ml  Net    704 ml   Filed Weights   05/22/15 0503 05/23/15 0439 05/24/15 0451  Weight: 75.388 kg (166 lb 3.2 oz) 75.751 kg (167 lb) 73.256 kg (161 lb 8 oz)    Objective: Physical Exam: Filed Vitals:   05/23/15 2019 05/23/15 2056 05/24/15 0451 05/24/15 0835  BP:  118/58 150/77   Pulse:  80 86   Temp:  98 F (36.7 C) 97.4 F (36.3 C)   TempSrc:  Oral Oral   Resp:  20 20   Height:      Weight:   73.256 kg (161 lb 8 oz)   SpO2: 92% 91% 95% 94%    General: Appear in mild distress, no Rash; Oral Mucosa moist. Cardiovascular: S1 and S2 Present, no Murmur, Respiratory: Bilateral Air entry present and occasional wheezes Abdomen: Bowel Sound present, Soft and no tenderness Extremities: no Pedal edema, no calf tenderness Neurology: Grossly no focal neuro deficit.  Data Reviewed: CBC:  Recent Labs Lab 05/21/15 1103 05/21/15 1628 05/22/15 0110 05/22/15 1543 05/23/15 0540 05/24/15 0523  WBC 8.1 5.8 5.4 5.8 5.1 4.2  NEUTROABS 6.7 5.1  4.9 4.9  --  3.1  HGB 9.4* 8.8* 8.6* 8.6* 8.1* 8.3*  HCT 31.4* 28.8* 28.3* 28.4* 27.1* 27.2*  MCV 93.7 92.6 94.0 94.0 93.4 94.1  PLT 51* 47*  47* 44* 53* 39* 37*   Basic Metabolic Panel:  Recent Labs Lab 05/21/15 1103 05/22/15 1543 05/24/15 0523  NA 139 137 138  K 4.2 4.3 3.8  CL 102 103 103  CO2 '25 23 30  '$ GLUCOSE 121* 160* 82  BUN 30* 23* 21*  CREATININE 0.78 0.71 0.72    CALCIUM 9.8 9.2 9.0   Liver Function Tests:  Recent Labs Lab 05/22/15 1543 05/24/15 0523  AST 24 17  ALT 31 29  ALKPHOS 71 67  BILITOT 1.1 1.1  PROT 6.3* 5.8*  ALBUMIN 3.5 3.2*   No results for input(s): LIPASE, AMYLASE in the last 168 hours. No results for input(s): AMMONIA in the last 168 hours.  Cardiac Enzymes:  Recent Labs Lab 05/21/15 1103  TROPONINI <0.03   BNP (last 3 results)  Recent Labs  07/21/14 1821 05/05/15 0556 05/21/15 1104  BNP 39.7 74.3 79.0    ProBNP (last 3 results) No results for input(s): PROBNP in the last 8760 hours.   CBG:  Recent Labs Lab 05/23/15 0732  GLUCAP 87    No results found for this or any previous visit (from the past 240 hour(s)).   Studies: No results found.   Scheduled Meds: . budesonide-formoterol  2 puff Inhalation BID  . dexamethasone  4 mg Oral Daily  . enoxaparin (LOVENOX) injection  1 mg/kg Subcutaneous Q12H  . famotidine  20 mg Oral QHS  . fluticasone  2 spray Each Nare Daily  . gabapentin  300 mg Oral TID  . guaiFENesin  600 mg Oral BID  . levothyroxine  50 mcg Oral QAC breakfast  . mirtazapine  30 mg Oral QHS  . sodium chloride  3 mL Intravenous Q12H  . tamsulosin  0.4 mg Oral QHS   Continuous Infusions:  PRN Meds: acetaminophen **OR** acetaminophen, albuterol, ALPRAZolam, benzonatate, docusate sodium, nitroGLYCERIN, ondansetron **OR** ondansetron (ZOFRAN) IV, oxyCODONE, senna  Time spent: 35 minutes  Author: Berle Mull, MD [facility] Pager: [phone] 05/24/2015 10:51 AM  If 7PM-7AM, please contact night-coverage at [email or web address], password [facility]

## 2015-05-24 NOTE — Evaluation (Signed)
Physical Therapy Evaluation Patient Details Name: Nicholas Schmitt MRN: [id number] DOB: 1935-08-06 Today's Date: 05/24/2015   History of Present Illness  79 yo male admitted with recurrent pulmonary embolism. History significant for coronary artery disease, COPD Gold stage II, AAA, chronic kidney disease stage II, non-small cell lung cancer stage IV, status post craniotomy with tumor excision of brain metastasis on 12/04/14, recent history of pulmonary embolism, now on Xarelto and status post IVC filter placement on 28/31 without complications. Patient is also status post stereotactic radiosurgery for spinal metastasis on 04/16/15.  Clinical Impression  Pt admitted with above diagnosis. Pt currently with functional limitations due to the deficits listed below (see PT Problem List). Pt ambulated 200' x 2 with RW. SaO2 85% on RA walking, 94% on 4L O2.  Pt will benefit from skilled PT to increase their independence and safety with mobility to allow discharge to the venue listed below.       Follow Up Recommendations SNF    Equipment Recommendations  None recommended by PT    Recommendations for Other Services       Precautions / Restrictions Precautions Precautions: Fall Restrictions Weight Bearing Restrictions: No      Mobility  Bed Mobility               General bed mobility comments: NT-up in recliner  Transfers Overall transfer level: Needs assistance Equipment used: Rolling walker (2 wheeled) Transfers: Sit to/from Stand Sit to Stand: Min guard         General transfer comment: steadying assistance; cues for safety  Ambulation/Gait Ambulation/Gait assistance: Min guard Ambulation Distance (Feet): 200 Feet Assistive device: Rolling walker (2 wheeled) Gait Pattern/deviations: Step-through pattern   Gait velocity interpretation: at or above normal speed for age/gender General Gait Details: SaO2 85% on RA walking, 3/4 dyspnea; 94% on 4L O2 [place] walking; steady with  walking; 200' x 2 with seated rest break, distance limited by SOB  Stairs            Wheelchair Mobility    Modified Rankin (Stroke Patients Only)       Balance     Sitting balance-Leahy Scale: Good (at least fair, NT further)       Standing balance-Leahy Scale: Fair                               Pertinent Vitals/Pain Pain Assessment: No/denies pain    Home Living Family/patient expects to be discharged to:: Skilled nursing facility Living Arrangements: Alone Available Help at Discharge: Family (friend, June assists as much as she can but can't provide 24hr care/supervision) Type of Home: House Home Access: Stairs to enter Entrance Stairs-Rails: Can reach both Entrance Stairs-Number of Steps: 3 Home Layout: One level Home Equipment: [place] - 2 wheels;Cane - single point Additional Comments: admitted from [facility] where he walked with a RW    Prior Function Level of Independence: Independent with assistive device(s)         Comments: prior to going to SNF he used walker for ambulation at times, 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" and his small house if full of "stuff"--making it difficult for him to move around     Hand Dominance        Extremity/Trunk Assessment   Upper Extremity Assessment: Overall WFL for tasks assessed           Lower Extremity Assessment: Overall WFL for tasks assessed  Communication      Cognition Arousal/Alertness: Awake/alert Behavior During Therapy: WFL for tasks assessed/performed Overall Cognitive Status: Within Functional Limits for tasks assessed                      General Comments      Exercises        Assessment/Plan    PT Assessment Patient needs continued PT services  PT Diagnosis Difficulty walking   PT Problem List Decreased activity tolerance;Cardiopulmonary status limiting activity;Decreased mobility  PT Treatment Interventions Gait training;DME instruction;Therapeutic  activities;Patient/family education;Therapeutic exercise   PT Goals (Current goals can be found in the Care Plan section) Acute Rehab PT Goals Patient Stated Goal: to go to rehab to get stronger and to improve safe mobility at home; "get back to going to yard sales" Time For Goal Achievement: 06/07/15 Potential to Achieve Goals: Good    Frequency Min 3X/week   Barriers to discharge        Co-evaluation               End of Session Equipment Utilized During Treatment: Gait belt;Oxygen Activity Tolerance: Patient tolerated treatment well Patient left: in chair;with call bell/phone within reach;with chair alarm set;with family/visitor present Nurse Communication: Mobility status         Time: 7741-4239 PT Time Calculation (min) (ACUTE ONLY): 21 min   Charges:   PT Evaluation $Initial PT Evaluation Tier I: 1 Procedure     PT G Codes:        Blondell Reveal Kistler 05/24/2015, 2:44 PM [phone]

## 2015-05-24 NOTE — Progress Notes (Signed)
SATURATION QUALIFICATIONS: (This note is used to comply with regulatory documentation for home oxygen)  Patient Saturations on Room Air at Rest = 96%  Patient Saturations on Room Air while Ambulating = 85*%  Patient Saturations on 4 Liters of oxygen while Ambulating = 94%  Please briefly explain why patient needs home oxygen: to maintain appropriate O2 levels.   Blondell Reveal Kistler PT 05/24/2015  [phone]

## 2015-05-25 ENCOUNTER — Inpatient Hospital Stay: Admission: RE | Admit: 2015-05-25 | Payer: Medicare Other | Source: Ambulatory Visit | Admitting: Radiation Oncology

## 2015-05-25 ENCOUNTER — Ambulatory Visit: Payer: Self-pay | Admitting: Radiation Oncology

## 2015-05-25 ENCOUNTER — Inpatient Hospital Stay (HOSPITAL_COMMUNITY): Payer: Medicare Other

## 2015-05-25 ENCOUNTER — Inpatient Hospital Stay: Admission: RE | Admit: 2015-05-25 | Payer: Self-pay | Source: Ambulatory Visit | Admitting: Radiation Oncology

## 2015-05-25 DIAGNOSIS — Z86711 Personal history of pulmonary embolism: Secondary | ICD-10-CM

## 2015-05-25 DIAGNOSIS — D696 Thrombocytopenia, unspecified: Secondary | ICD-10-CM

## 2015-05-25 DIAGNOSIS — Z9889 Other specified postprocedural states: Secondary | ICD-10-CM | POA: Diagnosis present

## 2015-05-25 DIAGNOSIS — Z86718 Personal history of other venous thrombosis and embolism: Secondary | ICD-10-CM

## 2015-05-25 DIAGNOSIS — D649 Anemia, unspecified: Secondary | ICD-10-CM

## 2015-05-25 DIAGNOSIS — J90 Pleural effusion, not elsewhere classified: Secondary | ICD-10-CM | POA: Diagnosis present

## 2015-05-25 DIAGNOSIS — R55 Syncope and collapse: Secondary | ICD-10-CM | POA: Diagnosis present

## 2015-05-25 DIAGNOSIS — I2699 Other pulmonary embolism without acute cor pulmonale: Secondary | ICD-10-CM

## 2015-05-25 LAB — EXPECTORATED SPUTUM ASSESSMENT W REFEX TO RESP CULTURE

## 2015-05-25 LAB — CBC WITH DIFFERENTIAL/PLATELET
BASOS PCT: 0 %
Basophils Absolute: 0 10*3/uL (ref 0.0–0.1)
EOS PCT: 2 %
Eosinophils Absolute: 0.1 10*3/uL (ref 0.0–0.7)
HEMATOCRIT: 27.5 % — AB (ref 39.0–52.0)
HEMOGLOBIN: 8.5 g/dL — AB (ref 13.0–17.0)
LYMPHS ABS: 0.6 10*3/uL — AB (ref 0.7–4.0)
Lymphocytes Relative: 14 %
MCH: 29 pg (ref 26.0–34.0)
MCHC: 30.9 g/dL (ref 30.0–36.0)
MCV: 93.9 fL (ref 78.0–100.0)
MONO ABS: 0.4 10*3/uL (ref 0.1–1.0)
MONOS PCT: 10 %
Neutro Abs: 3.2 10*3/uL (ref 1.7–7.7)
Neutrophils Relative %: 74 %
Platelets: 34 10*3/uL — ABNORMAL LOW (ref 150–400)
RBC: 2.93 MIL/uL — AB (ref 4.22–5.81)
RDW: 24.7 % — AB (ref 11.5–15.5)
WBC: 4.3 10*3/uL (ref 4.0–10.5)

## 2015-05-25 LAB — BODY FLUID CELL COUNT WITH DIFFERENTIAL
EOS FL: 0 %
LYMPHS FL: 34 %
MONOCYTE-MACROPHAGE-SEROUS FLUID: 50 % (ref 50–90)
Neutrophil Count, Fluid: 16 % (ref 0–25)
Total Nucleated Cell Count, Fluid: 781 cu mm (ref 0–1000)

## 2015-05-25 LAB — COMPREHENSIVE METABOLIC PANEL
ALBUMIN: 3.5 g/dL (ref 3.5–5.0)
ALK PHOS: 76 U/L (ref 38–126)
ALT: 25 U/L (ref 17–63)
AST: 17 U/L (ref 15–41)
Anion gap: 7 (ref 5–15)
BILIRUBIN TOTAL: 1.6 mg/dL — AB (ref 0.3–1.2)
BUN: 21 mg/dL — ABNORMAL HIGH (ref 6–20)
CALCIUM: 9.4 mg/dL (ref 8.9–10.3)
CHLORIDE: 105 mmol/L (ref 101–111)
CO2: 29 mmol/L (ref 22–32)
CREATININE: 0.74 mg/dL (ref 0.61–1.24)
GFR calc non Af Amer: >60 mL/min (ref >60)
GLUCOSE: 76 mg/dL (ref 65–99)
Potassium: 3.4 mmol/L — ABNORMAL LOW (ref 3.5–5.1)
SODIUM: 141 mmol/L (ref 135–145)
Total Protein: 6.2 g/dL — ABNORMAL LOW (ref 6.5–8.1)

## 2015-05-25 LAB — GRAM STAIN

## 2015-05-25 LAB — PROTEIN, BODY FLUID

## 2015-05-25 LAB — LACTATE DEHYDROGENASE, PLEURAL OR PERITONEAL FLUID: LD FL: 77 U/L — AB (ref 3–23)

## 2015-05-25 LAB — STREP PNEUMONIAE URINARY ANTIGEN: STREP PNEUMO URINARY ANTIGEN: NEGATIVE

## 2015-05-25 LAB — EXPECTORATED SPUTUM ASSESSMENT W GRAM STAIN, RFLX TO RESP C

## 2015-05-25 LAB — MAGNESIUM: Magnesium: 1.9 mg/dL (ref 1.7–2.4)

## 2015-05-25 LAB — TROPONIN I
Troponin I: <0.03 ng/mL (ref <0.031)
Troponin I: <0.03 ng/mL (ref <0.031)

## 2015-05-25 LAB — HEPARIN INDUCED PLATELET AB (HIT ANTIBODY): HEPARIN INDUCED PLT AB: 0.354 {OD_unit} (ref 0.000–0.400)

## 2015-05-25 NOTE — Progress Notes (Signed)
Patient noted eyes rolled up, in bed lying back, breathing fast, not responding verbally which lasted less than 1 minute  oxygen saturation 94%-1.5L, heart rate in the 130s, resp-28, 158/97, then patient opened his eyes and stated he is able to breath better, O2 sat 97%-2L, Dr. Posey Pronto notified, on the way to see patient. Patient/family informed, at the bedside. Will continue to monitor patient.

## 2015-05-25 NOTE — Progress Notes (Signed)
Echocardiogram 2D Echocardiogram has been performed.  Joelene Millin 05/25/2015, 2:02 PM

## 2015-05-25 NOTE — Procedures (Signed)
US guided diagnostic/therapeutic right thoracentesis performed yielding 630 cc hazy, blood-tinged fluid. F/u CXR pending. The fluid was sent to the lab for preordered studies. No immediate complications.

## 2015-05-25 NOTE — Progress Notes (Signed)
[facility] Progress Note    Patient: Nicholas Schmitt    TKW:[id number]  DOB: 07-19-1935     DOA: 05/21/2015 Date of Service: the patient was seen and examined on 05/25/2015  Subjective: Earlier in the morning the patient had a syncope. He was lying down in his bed and suddenly started complaining of having me help me and I cannot breathe. His eyes rolled up and he passed out for a few minutes. He denies having any focal deficit on repeat examination. No complaints of chest pain and abdominal pain. His shortness of breath is stable. No dizziness or lightheadedness. Telemetry showed sinus tachycardia with heart rate ranging to 130s.  Nutrition: able to tolerate oral diet Activity: Ambulating in the room Last BM: 05/25/2015  Assessment and Plan: 1. Recurrent pulmonary embolism (HCC) Xarelto failure. Status post IVC filter 9/16  Heparin induced antibody level 0.354, less than diagnostic, thus negative.  Platelet counts are stabilizing appreciate hematology input. Continue Lovenox. Upper extremity Doppler is positive for a new DVT on SVC. 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  2. thrombocytopenia. Appears to be consumptive due to acute embolism. DIC panel negative for any hemolysis thrombo-angiopathy, smear negative for schistocytes. Plan to continue close monitoring due to therapeutic anticoagulation.Appreciate input from hematology. Continue therapeutic anticoagulation  3.  History of radiation therapy   Non-small cell carcinoma of lung, stage 4 ([place])   H/O craniotomy 11/2014 Continuing Decadron at present. Physical therapy recommend SNF.   4 Hypothyroidism Continue Synthroid  5  Presence of permanent cardiac pacemaker No events telemetry  6.Left shift without diagnosis of specific infection No evidence of fever. Clinically continues to improve. Continue  close monitoring for any evidence of infection  7. Syncopal event. Etiology currently unclear but possibility of atrial myxoma causing syncope cannot be ruled out. Telemetry serial troponin and CMP are unremarkable. Would check limited echocardiogram as well.  8. Recurrent right pleural effusion, patchy opacities bilaterally. Possibility of infection cannot be ruled out. Checking sputum culture streptococcal antigen as well as Legionella antigen. Also getting blood culture. Underwent ultrasound-guided thoracentesis. Workup shows transudative effusion and repeat x-ray does not show any patchy infiltrate. Continue monitoring.  Nutrition: Regular diet  Advance goals of care discussion: Full code  Brief Summary of Hospitalization:  HPI: As per the H and P dictated on admission, "patient presents with complains of shortness of breath, was found to be having acute on chronic pulmonary embolism" Daily update: 05/23/2015, patient transitioned to Lovenox Consultants:Hematology  Procedures:Ultrasound-guided thoracentesis  Limited echocardiogram  Antibiotics: Anti-infectives    None      Family Communication:Discussed with the family and all the questions were answered satisfactorily  Disposition:  Expected discharge date: 05/26/2015 Barriers to safe discharge: Platelet count, sputum culture   Intake/Output Summary (Last 24 hours) at 05/25/15 1901 Last data filed at 05/25/15 1800  Gross per 24 hour  Intake    840 ml  Output    201 ml  Net    639 ml   Filed Weights   05/23/15 0439 05/24/15 0451 05/25/15 0446  Weight: 75.751 kg (167 lb) 73.256 kg (161 lb 8 oz) 72.9 kg (160 lb 11.5 oz)    Objective: Physical Exam: Filed Vitals:   05/25/15 0859 05/25/15 1414 05/25/15 1537 05/25/15 1552  BP: 158/97 119/64 102/59 103/66  Pulse: 82 69    Temp: 97.2 F (36.2 C) 97.4 F (36.3 C)  TempSrc: Oral Oral    Resp: 28 24    Height:      Weight:      SpO2: 97% 96%      General:  Appear in mild distress, no Rash; Oral Mucosa moist. Cardiovascular: S1 and S2 Present, no Murmur, Respiratory: Bilateral Air entry present and occasional wheezes Abdomen: Bowel Sound present, Soft and no tenderness Extremities: no Pedal edema, no calf tenderness Neurology: Grossly no focal neuro deficit.  Data Reviewed: CBC:  Recent Labs Lab 05/21/15 1628 05/22/15 0110 05/22/15 1543 05/23/15 0540 05/24/15 0523 05/25/15 0521  WBC 5.8 5.4 5.8 5.1 4.2 4.3  NEUTROABS 5.1 4.9 4.9  --  3.1 3.2  HGB 8.8* 8.6* 8.6* 8.1* 8.3* 8.5*  HCT 28.8* 28.3* 28.4* 27.1* 27.2* 27.5*  MCV 92.6 94.0 94.0 93.4 94.1 93.9  PLT 47*  47* 44* 53* 39* 37* 34*   Basic Metabolic Panel:  Recent Labs Lab 05/21/15 1103 05/22/15 1543 05/24/15 0523 05/25/15 0956  NA 139 137 138 141  K 4.2 4.3 3.8 3.4*  CL 102 103 103 105  CO2 '25 23 30 29  '$ GLUCOSE 121* 160* 82 76  BUN 30* 23* 21* 21*  CREATININE 0.78 0.71 0.72 0.74  CALCIUM 9.8 9.2 9.0 9.4  MG  --   --   --  1.9   Liver Function Tests:  Recent Labs Lab 05/22/15 1543 05/24/15 0523 05/25/15 0956  AST '24 17 17  '$ ALT '31 29 25  '$ ALKPHOS 71 67 76  BILITOT 1.1 1.1 1.6*  PROT 6.3* 5.8* 6.2*  ALBUMIN 3.5 3.2* 3.5   No results for input(s): LIPASE, AMYLASE in the last 168 hours. No results for input(s): AMMONIA in the last 168 hours.  Cardiac Enzymes:  Recent Labs Lab 05/21/15 1103 05/25/15 0956  TROPONINI <0.03 <0.03   BNP (last 3 results)  Recent Labs  07/21/14 1821 05/05/15 0556 05/21/15 1104  BNP 39.7 74.3 79.0    ProBNP (last 3 results) No results for input(s): PROBNP in the last 8760 hours.   CBG:  Recent Labs Lab 05/23/15 0732  GLUCAP 87    Recent Results (from the past 240 hour(s))  Culture, expectorated sputum-assessment     Status: None   Collection Time: 05/25/15  1:30 PM  Result Value Ref Range Status   Specimen Description SPUTUM  Final   Special Requests NONE  Final   Sputum evaluation   Final    THIS  SPECIMEN IS ACCEPTABLE. RESPIRATORY CULTURE REPORT TO FOLLOW.   Report Status 05/25/2015 FINAL  Final  Gram stain     Status: None   Collection Time: 05/25/15  3:57 PM  Result Value Ref Range Status   Specimen Description FLUID PLEURAL RIGHT  Final   Special Requests BOTTLES DRAWN AEROBIC AND ANAEROBIC 10CC  Final   Gram Stain   Final    CYTOSPIN SMEAR WBC PRESENT, PREDOMINANTLY MONONUCLEAR NO ORGANISMS SEEN    Report Status 05/25/2015 FINAL  Final     Studies: Dg Chest 1 View  05/25/2015  CLINICAL DATA:  Post right-sided thoracentesis. EXAM: CHEST 1 VIEW COMPARISON:  05/25/2015; 05/21/2015; chest CT - 05/21/2015 FINDINGS: Interval reduction / near resolution of right-sided pleural effusion post thoracentesis. No pneumothorax. Grossly unchanged cardiac silhouette and mediastinal contours. Right perihilar heterogeneous opacities are unchanged with persistent moderate elevation of the right hemidiaphragm. Well-defined retrocardiac opacity compatible with known Bochdalek hernia. No evidence of edema. Stable position of support apparatus. No acute osseus abnormalities. IMPRESSION: 1. Interval reduction /  near resolution of right-sided effusion post thoracentesis. No pneumothorax. 2. Stable sequela of perihilar radiation change in associated volume loss. 3. Unchanged well-defined retrocardiac opacity compatible with known Bochdalek hernia. Electronically Signed   By: Sandi Mariscal M.D.   On: 05/25/2015 16:16   Dg Chest 2 View  05/25/2015  CLINICAL DATA:  Recurrent pulmonary embolism. History of coronary artery disease, COPD and non-small-cell lung cancer. Weakness and shortness of breath today. EXAM: CHEST  2 VIEW COMPARISON:  Radiographs and CT 05/21/2015. FINDINGS: There are lower lung volumes with enlargement of the subpulmonic right pleural effusion and increased patchy airspace opacities at both lung bases. Underlying right perihilar density corresponds with radiation fibrosis on CT. The heart  size and mediastinal contours are stable. Left subclavian AICD leads appear unchanged. IMPRESSION: Worsening aeration of the lung bases with enlarging right pleural effusion and patchy bibasilar airspace opacities. Electronically Signed   By: Richardean Sale M.D.   On: 05/25/2015 10:36   US Thoracentesis Asp Pleural Space W/img Guide  05/25/2015  INDICATION: Patient with stage IV lung cancer, prostate cancer, recurrent PE, dyspnea, recurrent right pleural effusion. Request is made for diagnostic and therapeutic right thoracentesis. EXAM: ULTRASOUND GUIDED DIAGNOSTIC AND THERAPEUTIC RIGHT THORACENTESIS COMPARISON:  Prior thoracentesis on 05/05/2015 MEDICATIONS: None COMPLICATIONS: None immediate TECHNIQUE: Informed written consent was obtained from the patient after a discussion of the risks, benefits and alternatives to treatment. A timeout was performed prior to the initiation of the procedure. Initial ultrasound scanning demonstrates a small to moderate right pleural effusion. The lower chest was prepped and draped in the usual sterile fashion. 1% lidocaine was used for local anesthesia. An ultrasound image was saved for documentation purposes. A 6 Fr Safe-T-Centesis catheter was introduced. The thoracentesis was performed. The catheter was removed and a dressing was applied. The patient tolerated the procedure well without immediate post procedural complication. The patient was escorted to have an upright chest radiograph. FINDINGS: A total of approximately 630 cc of hazy, blood-tinged fluid was removed. Requested samples were sent to the laboratory. IMPRESSION: Successful ultrasound-guided diagnostic and therapeutic right sided thoracentesis yielding 630 cc of pleural fluid. Read by: Rowe Robert, PA-C Electronically Signed   By: Sandi Mariscal M.D.   On: 05/25/2015 16:01     Scheduled Meds: . budesonide-formoterol  2 puff Inhalation BID  . dexamethasone  4 mg Oral Daily  . enoxaparin (LOVENOX) injection   1 mg/kg Subcutaneous Q12H  . famotidine  20 mg Oral QHS  . fluticasone  2 spray Each Nare Daily  . gabapentin  300 mg Oral TID  . guaiFENesin  600 mg Oral BID  . levothyroxine  50 mcg Oral QAC breakfast  . mirtazapine  30 mg Oral QHS  . sodium chloride  3 mL Intravenous Q12H  . tamsulosin  0.4 mg Oral QHS   Continuous Infusions:  PRN Meds: acetaminophen **OR** acetaminophen, albuterol, ALPRAZolam, benzonatate, docusate sodium, nitroGLYCERIN, ondansetron **OR** ondansetron (ZOFRAN) IV, oxyCODONE, senna  Time spent: 35 minutes  Author: Berle Mull, MD [facility] Pager: [phone] 05/25/2015 7:01 PM  If 7PM-7AM, please contact night-coverage at [email or web address], password [facility]

## 2015-05-25 NOTE — Care Management Important Message (Signed)
Important Message  Patient Details  Name: Nicholas Schmitt MRN: [id number] Date of Birth: [birth year]   Medicare Important Message Given:  Yes    Camillo Flaming 05/25/2015, 1:43 PMImportant Message  Patient Details  Name: Nicholas Schmitt MRN: [id number] Date of Birth: 12/31/1935   Medicare Important Message Given:  Yes    Camillo Flaming 05/25/2015, 1:43 PM

## 2015-05-25 NOTE — Progress Notes (Signed)
Nicholas Schmitt   DOB:1935-07-05   JQ#:[id number]   WIO#:[id number]  Subjective: This is a very pleasant 79 years old white male with stage IV non-small cell lung cancer, adenocarcinoma with positive PDL 1 expression, who completed systemic chemotherapy with carboplatin and Alimta status post 6 cycles and tolerated his treatment fairly well.  He is currently on treatment with immunotherapy with Ketruda (pembrolizumab) every 3 weeks status post 13 cycles.  [facility] with shortness of breath and diaphoresis, dizziness.Denies fevers, chills, night sweats, vision changes, or mucositis. Denies any chest pain or palpitations. Denies lower extremity swelling. Denies nausea, heartburn or change in bowel habits. Appetite is normal. Denies any dysuria. Denies abnormal skin rashes, or neuropathy. Denies any bleeding issues such as epistaxis, hematemesis, hematuria or hematochezia.  Because of his recent history of PE requiring IVC filter placement on 9/16  and on Xarelto, an new CTA was performed. This was positive for left PE despite medicine compliance. Dopplers were negative for lower extremity DVT. Upper extremity doppler is positive for DVT on SVC.  He was started on Heparin then switched to Lovenox on 12/10.  Platelets on admission were 47,000, then trending down to 34,000 today. Hb remained stable in the 8s without any bleeding issues, and his WBC was normal. DIC panel is negative for hemolysis; HIT results pending. Smear negative for schistocytes. He is currently on prednisone 4 mg daily so it is unclear if these values could have dropped furhter. He did not receive platelets transfusions during this hospital stay.  We were consulted for evaluation of thrombocytopenia.    DIAGNOSIS: Non-small cell carcinoma of lung, stage 4  Primary site: Lung (Right)  Staging method: AJCC 7th Edition  Clinical: Stage IV (T1a, N3, M1b) signed by Nicholas Bears, MD on 02/01/2014 1:42 PM   Summary: Stage IV (T1a, N3, M1b) Prostate cancer  Primary site: Prostate  Clinical: Stage I (T1c, NX, M0) signed by Nicholas Portela, MD on 08/06/2013 1:59 PM   Summary: Stage I (T1c, NX, M0)  PRIOR THERAPY:  1) Systemic chemotherapy with carboplatin for an AUC of 5 and Alimta 500 mg per meter squared given every 3 weeks. Status post 6 cycles, last dose was given 05/20/2014 discontinued secondary to disease progression. 2) palliative radiotherapy to the right lung and lumbar spines under the care of Dr. Sondra Schmitt. 3) status post craniotomy with tumor excision under the care of Dr. Vertell Schmitt on 12/04/2014.  CURRENT THERAPY:  1) Immunotherapy with Ketruda (pembrolizumab) 200 KG every 3 weeks. First dose 07/29/2014. He has positive PDL 1 expression (90%). Status post 13 cycles. 2) Xarelto 20 mg by mouth daily for deep venous thrombosis and pulmonary embolism.  Scheduled Meds: . budesonide-formoterol  2 puff Inhalation BID  . dexamethasone  4 mg Oral Daily  . enoxaparin (LOVENOX) injection  1 mg/kg Subcutaneous Q12H  . famotidine  20 mg Oral QHS  . fluticasone  2 spray Each Nare Daily  . gabapentin  300 mg Oral TID  . guaiFENesin  600 mg Oral BID  . levothyroxine  50 mcg Oral QAC breakfast  . mirtazapine  30 mg Oral QHS  . sodium chloride  3 mL Intravenous Q12H  . tamsulosin  0.4 mg Oral QHS   Continuous Infusions:  PRN Meds:.acetaminophen **OR** acetaminophen, albuterol, ALPRAZolam, benzonatate, docusate sodium, nitroGLYCERIN, ondansetron **OR** ondansetron (ZOFRAN) IV, oxyCODONE, senna  Objective:  Filed Vitals:   05/25/15 0859 05/25/15 1414  BP: 158/97 119/64  Pulse: 82 69  Temp: 97.2 F (36.2 C) 97.4 F (36.3 C)  Resp: 28 24    Body mass index is 21.79 kg/(m^2).  Intake/Output Summary (Last 24 hours) at 05/25/15 1418 Last data filed at 05/25/15 1210  Gross per 24 hour  Intake    480 ml  Output    200 ml  Net    280 ml    Physical Exam  Constitutional: He is oriented to  person, place, and time and well-developed, well-nourished, and in no distress.  HENT:  Head: Normocephalic and atraumatic.  Mouth/Throat: Oropharynx is clear and moist. No oropharyngeal exudate.  Eyes: Pupils are equal, round, and reactive to light. Right eye exhibits no discharge. No scleral icterus.  Neck: Neck supple. No JVD present. No thyromegaly present.  Cardiovascular: Normal rate, regular rhythm and normal heart sounds.  Exam reveals no gallop and no friction rub.   No murmur heard. Pulmonary/Chest: Effort normal. No stridor. No respiratory distress. He has no wheezes. He has no rales.  Abdominal: Soft. Bowel sounds are normal. He exhibits no distension and no mass. There is no tenderness.  Musculoskeletal: Normal range of motion. He exhibits no edema or tenderness.  Lymphadenopathy:    He has no cervical adenopathy.  Neurological: He is alert and oriented to person, place, and time. He exhibits normal muscle tone.  Skin: Skin is warm and dry. No rash noted. No erythema. No pallor.  Psychiatric: Mood, memory, affect and judgment normal.       Dopplers were negative for DVT. Upper extremity doppler is positive for DVT on SVC  CBG (last 3)   Recent Labs  05/23/15 0732  GLUCAP 87      Labs:   Recent Labs Lab 05/21/15 1628 05/22/15 0110 05/22/15 1543 05/23/15 0540 05/24/15 0523 05/25/15 0521  WBC 5.8 5.4 5.8 5.1 4.2 4.3  HGB 8.8* 8.6* 8.6* 8.1* 8.3* 8.5*  HCT 28.8* 28.3* 28.4* 27.1* 27.2* 27.5*  PLT 47*  47* 44* 53* 39* 37* 34*  MCV 92.6 94.0 94.0 93.4 94.1 93.9  MCH 28.3 28.6 28.5 27.9 28.7 29.0  MCHC 30.6 30.4 30.3 29.9* 30.5 30.9  RDW 24.3* 24.7* 24.8* 24.7* 24.8* 24.7*  LYMPHSABS 0.4* 0.4* 0.5*  --  0.6* 0.6*  MONOABS 0.3 0.4 0.4  --  0.5 0.4  EOSABS 0.0 0.0 0.0  --  0.0 0.1  BASOSABS 0.0 0.0 0.0  --  0.0 0.0     Chemistries:    Recent Labs Lab 05/21/15 1103 05/22/15 1543 05/24/15 0523 05/25/15 0956  NA 139 137 138 141  K 4.2 4.3 3.8 3.4*   CL 102 103 103 105  CO2 '25 23 30 29  '$ GLUCOSE 121* 160* 82 76  BUN 30* 23* 21* 21*  CREATININE 0.78 0.71 0.72 0.74  CALCIUM 9.8 9.2 9.0 9.4  MG  --   --   --  1.9  AST  --  '24 17 17  '$ ALT  --  '31 29 25  '$ ALKPHOS  --  71 67 76  BILITOT  --  1.1 1.1 1.6*     Liver Function Tests:  Recent Labs Lab 05/22/15 1543 05/24/15 0523 05/25/15 0956  AST '24 17 17  '$ ALT '31 29 25  '$ ALKPHOS 71 67 76  BILITOT 1.1 1.1 1.6*  PROT 6.3* 5.8* 6.2*  ALBUMIN 3.5 3.2* 3.5  Coagulation profile  Recent Labs Lab 05/21/15 1104 05/21/15 1628  INR 1.73* 1.43      Studies:  Dg Chest 2 View  05/25/2015  CLINICAL DATA:  Recurrent pulmonary embolism. History of coronary artery disease, COPD and non-small-cell lung cancer. Weakness and shortness of breath today. EXAM: CHEST  2 VIEW COMPARISON:  Radiographs and CT 05/21/2015. FINDINGS: There are lower lung volumes with enlargement of the subpulmonic right pleural effusion and increased patchy airspace opacities at both lung bases. Underlying right perihilar density corresponds with radiation fibrosis on CT. The heart size and mediastinal contours are stable. Left subclavian AICD leads appear unchanged. IMPRESSION: Worsening aeration of the lung bases with enlarging right pleural effusion and patchy bibasilar airspace opacities. Electronically Signed   By: Nicholas Schmitt M.D.   On: 05/25/2015 10:36     Assessment / Plan:    ASSESSMENT AND PLAN: this is a very pleasant 79 years old white male with stage IV non-small cell lung cancer, adenocarcinoma with positive PDL 1 expression, completed systemic chemotherapy with carboplatin and Alimta status post 6 cycles and tolerated his treatment fairly well.  He is currently on treatment with immunotherapy with Ketruda (pembrolizumab) status post 13 cycles.  He was admitted with recurrent PE while on Xarelto as evidenced by CTA on presentation. He has a filter placed in September of this year. Dopplers were  negative for lower extremity DVT. Upper extremity doppler is positive for DVT on SVC  He was started on Heparin then switched to Lovenox on 12/10.  Unclear the reason for its recurrence despite medicine compliance He has not been ambulating frequently in the setting of malignancy which could have contributed to the recurrence Appreciate Pharmacy involvement Liklely to need lifelong anticoagulation at this time  Thrombocytopenia In the setting of malignancy, decreased mobility recent hospitalizations Platelets on admission were 47,000, then trending down to 34,000 today.  DIC panel is negative for hemolysis  HIT results pending will monitor results  Smear negative for schistocytes.  He is currently on prednisone 4 mg daily for brain mets and COPD so it is unclear if these values could have dropped further.  He did not receive platelets transfusions during this hospital stay.  No bleeding issues reported at this time Monitor counts closely. May consider increasing Decadron  Transfuse 1 unit of platelets if count is less or equal than 10,000 or 20,000 if the patient is acutely bleeding  Anemia Due to recent treatment, dilution and polypharmacy No transfusion is indicated at this time Monitor counts closely Transfuse blood to maintain a Hb of 8 g or if the patient is acutely bleeding   Questionable right atrial myxoma l continue to monitor the patient closely. 2 D echo pending  History of COPD  continue treatment with Decadron 4 mg by mouth daily and supportive therapy.  DNR  Other medical issues as per admitting team     Nicholas Schmitt 05/25/2015  2:18 PM Medical Oncology and Hematology Nicholas Schmitt  ADDENDUM: Hematology/Oncology Attending: I agree with the above note. I want to see the patient today. He was undergoing ultrasound guided thoracentesis. His girlfriend and son were at the room. This is a very pleasant 79 years old white male with metastatic  non-small cell lung cancer, adenocarcinoma status post several chemotherapy regimens and he is currently undergoing treatment with immunotherapy with Ketruda (pembrolizumab) status post 13 cycles and has been tolerating his treatment fairly well. The patient also has a history of deep venous thrombosis and pulmonary embolism and was on treatment with Xarelto. He was admitted to [facility] on 05/21/2015 with shortness of breath worse when laying flat and associated  with diaphoresis. CT angiogram of the chest at that time showed acute pulmonary embolism within the left lower lobe with questionable involvement of the left upper lobe. The overall clot burden was minimal. The patient was switched to treatment with Lovenox and has been tolerating it well. On the day of admission his platelets count were 51,000 before starting Lovenox. 2 weeks before his platelets count were within the normal range with 164,000. His platelets count has been declining over the last few days and now down to 37,000. DIC panel was unremarkable except for the thrombocytopenia. HIT panel was sent but the results are still pending.  His thrombocytopenia is most likely consumptive in origin with recent pulmonary embolism but also could be associated with his current treatment with [place] (pembrolizumab), although less likely. I would recommend continuous observation for now and consideration of platelet transfusion if his platelets count is less than 20,000 or if the patient has any bleeding issues. For anticoagulation, I agree with the current anticoagulation with subcutaneous Lovenox. The patient also has questionable right atrial myxoma. He will need monitoring by cardiology. I will arrange for the patient to Schmitt back for follow-up visit at the [facility] within 1 week after his discharge for reevaluation and repeat blood work. Thank you so much for taking good care of Mr. Chrissie Noa. Please call if you have any questions.

## 2015-05-26 ENCOUNTER — Encounter: Payer: Medicare Other | Admitting: Occupational Therapy

## 2015-05-26 LAB — CBC
HCT: 28.6 % — ABNORMAL LOW (ref 39.0–52.0)
HEMOGLOBIN: 8.9 g/dL — AB (ref 13.0–17.0)
MCH: 30 pg (ref 26.0–34.0)
MCHC: 31.1 g/dL (ref 30.0–36.0)
MCV: 96.3 fL (ref 78.0–100.0)
PLATELETS: 36 10*3/uL — AB (ref 150–400)
RBC: 2.97 MIL/uL — ABNORMAL LOW (ref 4.22–5.81)
RDW: 25.2 % — ABNORMAL HIGH (ref 11.5–15.5)
WBC: 4.3 10*3/uL (ref 4.0–10.5)

## 2015-05-26 LAB — PH, BODY FLUID: PH, BODY FLUID: 7.6

## 2015-05-26 LAB — BASIC METABOLIC PANEL
ANION GAP: 7 (ref 5–15)
BUN: 19 mg/dL (ref 6–20)
CHLORIDE: 106 mmol/L (ref 101–111)
CO2: 29 mmol/L (ref 22–32)
Calcium: 8.9 mg/dL (ref 8.9–10.3)
Creatinine, Ser: 0.66 mg/dL (ref 0.61–1.24)
GFR calc Af Amer: >60 mL/min (ref >60)
GLUCOSE: 92 mg/dL (ref 65–99)
POTASSIUM: 4.3 mmol/L (ref 3.5–5.1)
Sodium: 142 mmol/L (ref 135–145)

## 2015-05-26 LAB — TROPONIN I: Troponin I: <0.03 ng/mL (ref <0.031)

## 2015-05-26 MED ORDER — ENOXAPARIN (LOVENOX) PATIENT EDUCATION KIT
PACK | Freq: Once | Status: AC
  Administered 2015-05-26: 10:00:00
  Filled 2015-05-26: qty 1

## 2015-05-26 MED ORDER — ENOXAPARIN SODIUM 80 MG/0.8ML ~~LOC~~ SOLN: 70.0000 mg | Freq: Two times a day (BID) | SUBCUTANEOUS | Status: DC

## 2015-05-26 MED ORDER — ENOXAPARIN SODIUM 80 MG/0.8ML ~~LOC~~ SOLN
70.0000 mg | Freq: Two times a day (BID) | SUBCUTANEOUS | Status: DC
  Administered 2015-05-26 – 2015-05-27 (×2): 70 mg via SUBCUTANEOUS
  Filled 2015-05-26 (×2): qty 0.8

## 2015-05-26 MED ORDER — BENZONATATE 100 MG PO CAPS: 100.0000 mg | ORAL_CAPSULE | Freq: Three times a day (TID) | ORAL | Status: AC | PRN

## 2015-05-26 MED ORDER — LEVOFLOXACIN 750 MG PO TABS: 750.0000 mg | ORAL_TABLET | Freq: Every day | ORAL | Status: DC

## 2015-05-26 MED ORDER — LEVOFLOXACIN 750 MG PO TABS
750.0000 mg | ORAL_TABLET | Freq: Every day | ORAL | Status: DC
  Administered 2015-05-26 – 2015-05-27 (×2): 750 mg via ORAL
  Filled 2015-05-26 (×2): qty 1

## 2015-05-26 MED ORDER — GUAIFENESIN ER 600 MG PO TB12: 600.0000 mg | ORAL_TABLET | Freq: Two times a day (BID) | ORAL | Status: AC

## 2015-05-26 NOTE — Discharge Summary (Signed)
[facility] Discharge Summary   Patient: Nicholas Schmitt    JXB:[id number] PCP: [name], MD   DOB: 12-04-1935 Date of admission: 05/21/2015   Date of discharge: 05/27/2015   Discharge Diagnoses:  Principal Problem:   Recurrent pulmonary embolism ([place]) Active Problems:   Essential hypertension   Prostate cancer ([place])   History of radiation therapy   Non-small cell carcinoma of lung, stage 4 ([place])   Atrial mass   Hypothyroidism   H/O craniotomy 11/2014   Presence of IVC filter   Presence of permanent cardiac pacemaker   Thrombocytopenia (HCC)   Left shift without diagnosis of specific infection   Recurrent pleural effusion on right   S/P thoracentesis   Syncope  Recommendations for Outpatient Follow-up:  1. Follow-up with PCP in one week for pneumonia 2. Follow up with hematology oncology in 1 week with CBC for follow-up on platelets 3. Follow-up with cardiology as needed  4. Discharging to SNF  Diet recommendation: Regular diet  Activity: The patient is advised to gradually reintroduce usual activities.   Discharge Condition: good  History of present illness: As per the H and P dictated on admission, "Nicholas Schmitt is a 79 y.o. 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 The patient is presenting with complaints of shortness of breath primarily worsening on laying flat associated with diaphoresis. He denies having any fever, cough, chest pain. He denies any recent change in his medication. He mentions he is compliant with his Xarelto. He also had some dizziness and lightheadedness along with these episodes. He denies any swelling in his upper  extremity or legs. He denies any active bleeding fall trauma or injury. Patient was recently admitted in skilled nursing facility for short-term rehabilitation after his recent hospitalization for pleural effusion.  The patient is coming from skilled nursing facility At his baseline ambulates with support And is independent for most of his ADL; does not manages his medication on his own."  Hospital Course:  Summary of his active problems in the hospital is as following. 1. Recurrent pulmonary embolism (HCC) Xarelto failure. Status post IVC filter 9/16  DIC panel negative for any schistocytes or evidence of DIC.  Heparin induced antibody level 0.354, less than diagnostic, thus negative.  Platelet counts are stabilizing appreciate hematology input.  Continue Lovenox was adjusted to 70 mg daily. Upper extremity Doppler is positive for a new DVT on SVC. 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  2. thrombocytopenia. Appears to be consumptive due to acute embolism. DIC panel negative for any hemolysis thrombo-angiopathy, smear negative for schistocytes. Appreciate input from hematology. Continue therapeutic anticoagulation  3. History of radiation therapy  Non-small cell carcinoma of lung, stage 4 ([place])  H/O craniotomy 11/2014 Continuing Decadron at present. Physical therapy recommend SNF.  4 Hypothyroidism Continue Synthroid  5 Presence of permanent cardiac pacemaker Occasional sinus tachycardia on telemetry no other arrhythmia identified  6. Healthcare associated pneumonia. Patient's chest x-ray showed patchy infiltrate. Sputum is growing gram-positive cocci in pairs as well as gram-negative rods. Identification is pending. With concern for left shift as well as recent events of syncope patient will be started  on levofloxacin to complete a 7 day treatment course  as per pharmacy. We will follow the response.  7. Syncopal event. Etiology currently unclear but possibility of atrial myxoma causing syncope cannot be ruled out. Telemetry serial troponin and CMP are unremarkable. Limited echocardiogram does not show any acute abnormality and showed presence of atrial myxoma as well. Ejection fraction 55-60%.  8. Recurrent right pleural effusion, patchy opacities bilaterally. Underwent ultrasound-guided thoracentesis. Workup shows transudative effusion and repeat x-ray does not show any patchy infiltrate. Continue monitoring.  9. Right atrial density Echocardiogram showed persistent density in the right atrium suspicious for possible clot versus myxoma. Recommendation was to repeat an TEE. Patient had a TEE last admission for similar finding and structure was identified as a highly mobile mass which did not appear to be a thrombus or vegetation. Thus at present noted for further workup. Patient will be empirically related with Lovenox therapeutically.  All other chronic medical condition were stable during the hospitalization.  Patient was seen by physical therapy, who recommended SNF, which was arranged by Education officer, museum and case Freight forwarder. On the day of the discharge the patient's vitals remained stable as well as platelets and there was no reported of bleeding, and no other acute medical condition were reported by patient. the patient was felt safe to be discharge at [facility].  Procedures and Results:  Transthoracic Echocardiography  Study Conclusions  - Left ventricle: The cavity size was normal. Systolic function was normal. The estimated ejection fraction was in the range of 55% to 60%. Wall motion was normal; there were no regional wall motion abnormalities. - Mitral valve: There was mild regurgitation. - Right atrium: There is a rounded bright density that is mobile and most likely represents an atrial lead in the RA but in the setting of  acute pulmonary embolism, cannot rule out thrombus. Recommend TEE for further evaluation Upper Extremity Venous Duplex Study Findings consistent with indeterminate age deep vein thrombosis  involving the right subclavian vein  Consultations:  Hematology oncology  Discharge Exam: Filed Weights   05/25/15 0446 05/26/15 0506 05/27/15 0500  Weight: 72.9 kg (160 lb 11.5 oz) 74.2 kg (163 lb 9.3 oz) 73.074 kg (161 lb 1.6 oz)   Filed Vitals:   05/26/15 1315 05/26/15 2126  BP: 104/65 107/51  Pulse: 96 71  Temp: 97.5 F (36.4 C) 97.5 F (36.4 C)  Resp: 24 22    General: Appear in no distress, no Rash; Oral Mucosa moist. Cardiovascular: S1 and S2 Present, no Murmur, no JVD Respiratory: Bilateral Air entry present and bilateral basal Crackles, no wheezes Abdomen: Bowel Sound present, Soft and no tenderness Extremities: no Pedal edema, no calf tenderness Neurology: Grossly no focal neuro deficit.  DISCHARGE MEDICATION: Discharge Instructions    Diet - low sodium heart healthy    Complete by:  As directed      Increase activity slowly    Complete by:  As directed           Current Discharge Medication List    START taking these medications   Details  benzonatate (TESSALON) 100 MG capsule Take 1 capsule (100 mg total) by mouth 3 (three) times daily as needed for cough. Qty: 20 capsule, Refills: 0    enoxaparin (LOVENOX) 80 MG/0.8ML injection Inject 0.7 mLs (70 mg total) into the skin every 12 (twelve) hours. Qty: 60 Syringe, Refills: 3    ferrous sulfate 325 (65 FE) MG tablet Take 1 tablet (325 mg total) by mouth daily with  breakfast. Qty: 60 tablet, Refills: 0    folic acid (FOLVITE) 1 MG tablet Take 1 tablet (1 mg total) by mouth daily. Qty: 30 tablet, Refills: 0    guaiFENesin (MUCINEX) 600 MG 12 hr tablet Take 1 tablet (600 mg total) by mouth 2 (two) times daily. Qty: 14 tablet, Refills: 0    levofloxacin (LEVAQUIN) 750 MG tablet Take 1 tablet (750 mg total) by  mouth daily. Qty: 6 tablet, Refills: 0      CONTINUE these medications which have NOT CHANGED   Details  albuterol (PROVENTIL HFA;VENTOLIN HFA) 108 (90 BASE) MCG/ACT inhaler Inhale into the lungs every 6 (six) hours as needed for wheezing or shortness of breath.    ALPRAZolam (XANAX) 0.25 MG tablet Take 1 tablet (0.25 mg total) by mouth 2 (two) times daily as needed for anxiety. Qty: 10 tablet, Refills: 0    budesonide-formoterol (SYMBICORT) 160-4.5 MCG/ACT inhaler Inhale 2 puffs into the lungs 2 (two) times daily.   Associated Diagnoses: COPD mixed type (HCC)    dexamethasone (DECADRON) 4 MG tablet Take 4 mg by mouth daily.    docusate sodium (COLACE) 100 MG capsule Take 100 mg by mouth at bedtime as needed for mild constipation.    famotidine (PEPCID) 20 MG tablet Take 20 mg by mouth at bedtime.    fluticasone (FLONASE) 50 MCG/ACT nasal spray USE 2 SPRAYS IN EACH NOSTRIL AT NIGHT Refills: 5    gabapentin (NEURONTIN) 300 MG capsule Take 300 mg by mouth 3 (three) times daily.    lactulose (CHRONULAC) 10 GM/15ML solution Take 15 mLs by mouth 2 (two) times daily as needed. constipaton Refills: 1    levothyroxine (SYNTHROID) 50 MCG tablet Take 1 tablet (50 mcg total) by mouth daily before breakfast. Qty: 30 tablet, Refills: 0   Associated Diagnoses: Hypothyroidism due to medication    mirtazapine (REMERON) 30 MG tablet Take 1 tablet (30 mg total) by mouth at bedtime. Qty: 30 tablet, Refills: 2   Associated Diagnoses: Non-small cell carcinoma of lung, stage 4, right ([place]); Brain metastasis ([place]); Metastasis to brain [facility]); Encounter for antineoplastic immunotherapy    Multiple Vitamins-Minerals (THERA-M) TABS Take 1 tablet by mouth daily.    nitroGLYCERIN (NITROSTAT) 0.4 MG SL tablet Place 0.4 mg under the tongue every 5 (five) minutes as needed for chest pain (MAX 3 TABLETS).    oxyCODONE (OXY IR/ROXICODONE) 5 MG immediate release tablet Take 1 tablet (5 mg total) by mouth every  6 (six) hours as needed for severe pain. Qty: 10 tablet, Refills: 0   Associated Diagnoses: Non-small cell carcinoma of lung, stage 4, right ([place]); Brain metastasis ([place]); Metastasis to brain [facility]); Encounter for antineoplastic immunotherapy    prochlorperazine (COMPAZINE) 10 MG tablet Take 10 mg by mouth every 6 (six) hours as needed for nausea or vomiting.    senna (SENOKOT) 8.6 MG tablet Take 1 tablet by mouth at bedtime as needed for constipation.    tamsulosin (FLOMAX) 0.4 MG CAPS capsule Take 0.4 mg by mouth at bedtime.  Refills: 2      STOP taking these medications     naproxen sodium (ANAPROX) 220 MG tablet      omeprazole (PRILOSEC OTC) 20 MG tablet      rivaroxaban (XARELTO) 20 MG TABS tablet      Witch Hazel (PREPARATION H EX)        No Known Allergies Follow-up Information    Follow up with [name],[name] ALLEN, MD. Call in 1 week.   Specialty:  Family Medicine   Contact information:   [place] [address] [phone]       Follow up with Eilleen Kempf., MD. Call in 3 days.   Specialty:  Oncology   Contact information:   [address] [place] [address] [phone]       Call Thompson Grayer, MD.   Specialty:  Cardiology   Why:  As needed   Contact information:   [address] [place] [address] [phone]       The results of significant diagnostics from this hospitalization (including imaging, microbiology, ancillary and laboratory) are listed below for reference.    Significant Diagnostic Studies: Dg Chest 1 View  05/25/2015  CLINICAL DATA:  Post right-sided thoracentesis. EXAM: CHEST 1 VIEW COMPARISON:  05/25/2015; 05/21/2015; chest CT - 05/21/2015 FINDINGS: Interval reduction / near resolution of right-sided pleural effusion post thoracentesis. No pneumothorax. Grossly unchanged cardiac silhouette and mediastinal contours. Right perihilar heterogeneous opacities are unchanged with persistent  moderate elevation of the right hemidiaphragm. Well-defined retrocardiac opacity compatible with known Bochdalek hernia. No evidence of edema. Stable position of support apparatus. No acute osseus abnormalities. IMPRESSION: 1. Interval reduction / near resolution of right-sided effusion post thoracentesis. No pneumothorax. 2. Stable sequela of perihilar radiation change in associated volume loss. 3. Unchanged well-defined retrocardiac opacity compatible with known Bochdalek hernia. Electronically Signed   By: Sandi Mariscal M.D.   On: 05/25/2015 16:16   Dg Chest 1 View  05/05/2015  CLINICAL DATA:  Post right thoracentesis. EXAM: CHEST 1 VIEW COMPARISON:  05/05/2015 FINDINGS: Persistent right pleural effusion with probable consolidation in the right lung base causing tenting of the hemidiaphragm. Mild improvement of aeration in the right lung. Prominence of the right hilum with volume loss in the right upper lung suggests obstructing mass lesion. No pneumothorax. Left lung appears clear and expanded with mild hyperinflation. Heart size and pulmonary vascularity are normal for technique. Calcification of the aorta. Cardiac pacemaker. Degenerative changes in the spine and shoulders. IMPRESSION: Persistent right pleural effusion with mild improvement of aeration in the right lung post thoracentesis. No pneumothorax. The volume loss in the right upper lung with right hilar prominence suggesting obstructing mass lesion. Electronically Signed   By: Lucienne Capers M.D.   On: 05/05/2015 18:29   Dg Chest 2 View  05/25/2015  CLINICAL DATA:  Recurrent pulmonary embolism. History of coronary artery disease, COPD and non-small-cell lung cancer. Weakness and shortness of breath today. EXAM: CHEST  2 VIEW COMPARISON:  Radiographs and CT 05/21/2015. FINDINGS: There are lower lung volumes with enlargement of the subpulmonic right pleural effusion and increased patchy airspace opacities at both lung bases. Underlying right  perihilar density corresponds with radiation fibrosis on CT. The heart size and mediastinal contours are stable. Left subclavian AICD leads appear unchanged. IMPRESSION: Worsening aeration of the lung bases with enlarging right pleural effusion and patchy bibasilar airspace opacities. Electronically Signed   By: Richardean Sale M.D.   On: 05/25/2015 10:36   Dg Chest 2 View  05/21/2015  CLINICAL DATA:  Shortness of breath with exertion EXAM: CHEST  2 VIEW COMPARISON:  05/05/2015 FINDINGS: Normal heart size. Dual-chamber pacer leads from the left are in unremarkable position. There is right perihilar fibrosis based on staging chest CT 03/27/2015. There is associated volume loss in the right upper lobe. Chronic elevation the right diaphragm with pleural effusion that is stable from 05/05/2015. Mild atelectasis or scarring at the left base. IMPRESSION: 1.  No change from 05/05/2015 to suggest acute disease. 2. Right perihilar fibrosis and upper lobe atelectasis from lung cancer radiation. 3. Small right pleural effusion. Electronically Signed   By: Monte Fantasia M.D.   On: 05/21/2015 11:18   Dg Chest 2 View  05/05/2015  CLINICAL DATA:  Acute onset of worsening shortness of breath and generalized weakness. Initial encounter. EXAM: CHEST  2 VIEW COMPARISON:  Chest radiograph performed 07/23/2014, and CT of the chest performed 03/27/2015 FINDINGS: A relatively large right-sided pleural effusion is noted, with underlying airspace opacification. Mild vascular congestion is noted. The left lung is grossly clear. No pneumothorax is seen. The cardiomediastinal silhouette is borderline normal in size. A pacemaker is noted at the left chest wall, with leads ending at the right atrium and right ventricle. No acute osseous abnormalities are seen. IMPRESSION: Increasing relatively large right-sided pleural effusion, with underlying airspace opacification. Mild vascular congestion noted. This is concerning for a malignant  effusion, given the patient's known lung cancer. Underlying pneumonia cannot be entirely excluded, depending on the patient's symptoms. Electronically Signed   By: Garald Balding M.D.   On: 05/05/2015 06:36   Ct Head W Wo Contrast  05/22/2015  CLINICAL DATA:  79 year old male with lung cancer metastatic to the brain status post tumor resection and stereotactic radiosurgery in June. Subsequent encounter. EXAM: CT HEAD WITHOUT AND WITH CONTRAST TECHNIQUE: Contiguous axial images were obtained from the base of the skull through the vertex without and with intravenous contrast CONTRAST:  60m OMNIPAQUE IOHEXOL 300 MG/ML  SOLN COMPARISON:  03/13/2015. FINDINGS: Sequelae of posterior left craniotomy is stable. No acute or suspicious osseous lesion identified. Chronic dural calcifications. Visualized paranasal sinuses and mastoids are clear. No acute orbit or scalp soft tissue findings. Stable CT appearance of the left posterior hemisphere resection cavity with no regional mass effect. Ex vacuo enlargement of the left atrium. Following contrast, no abnormal enhancement identified. Stable and normal gray-white matter differentiation elsewhere. No intracranial mass effect. No new intracranial enhancement. No acute intracranial hemorrhage identified. No cortically based acute infarct identified. Major intracranial vascular structures are enhancing. Calcified atherosclerosis at the skull base. IMPRESSION: 1. Stable and satisfactory post treatment CT appearance of the brain. 2.  No acute or metastatic intracranial abnormality. Electronically Signed   By: HGenevie AnnM.D.   On: 05/22/2015 20:19   Ct Angio Chest Pe W/cm &/or Wo Cm  05/21/2015  CLINICAL DATA:  Short of breath exertion. COPD. Concern pulmonary embolism. Personal history of abdominal aortic aneurysm and lung cancer cells prostate cancer. EXAM: CT ANGIOGRAPHY CHEST WITH CONTRAST TECHNIQUE: Multidetector CT imaging of the chest was performed using the standard  protocol during bolus administration of intravenous contrast. Multiplanar CT image reconstructions and MIPs were obtained to evaluate the vascular anatomy. CONTRAST:  1034mOMNIPAQUE IOHEXOL 350 MG/ML SOLN COMPARISON:  CT thorax 05/05/2015 FINDINGS: Mediastinum/Nodes: There is extensive venous collaterals within the mediastinum related to occlusion of the superior vena cava. This occlusion is chronic and similar to CT of 05/05/2015. The extensive collateralization does limit timing and bolus concentration for evaluation of pulmonary embolism. Small focal filling defect within the LEFT lower lobe pulmonary artery (image 164 through 175 of series 10)extends a short distance into segmental pulmonary arteries. Potential small filling defect within a LEFT upper lobe pulmonary artery on image 95, series 10. The RIGHT pulmonary artery is constricted at the hilum (image 125, series 10). This is a chronic finding but no clear filling defects within the RIGHT pulmonary arteries.  The RIGHT ventricular to LEFT ventricular ratio is less than 0.9 (no evidence of RIGHT ventricular strain). Lungs/Pleura: There is perihilar consolidation and bronchiectasis on the RIGHT consists with radiation change. Moderate RIGHT effusion. No new nodularity. LEFT lung parenchyma is clear. No pulmonary infarction noted. Upper abdomen: Thickening of the LEFT adrenal gland 2.6 by 1.9 cm (image 22, series 10) this compares to 2.7 by 1.8 cm on most recent CT scan for no significant change. There is thickening of the esophagus above the carina (image 75, series 5 Musculoskeletal: No aggressive osseous lesion. Review of the MIP images confirms the above findings. IMPRESSION: 1. Acute pulmonary embolism within the LEFT lower lobe and probably within the LEFT upper lobe. The overall clot burden is a minimal. No CT evidence of RIGHT ventricular strain. 2. Chronic constriction of the RIGHT main pulmonary artery by a perihilar radiation change. 3. Chronic  perihilar consolidation and bronchiectasis in the RIGHT hilum unchanged from prior consistent radiation change. 4. Moderate RIGHT effusion is improved compared to prior. 5. 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 6. Thickening esophagus above the carina and likely relates radiation change. 7. Stable thickening of the RIGHT adrenal gland. Critical results were conveyed to Dr. Theotis Burrow, on 05/21/2015 at 14 10 hours Electronically Signed   By: Suzy Bouchard M.D.   On: 05/21/2015 14:10   Ct Angio Chest Pe W/cm &/or Wo Cm  05/05/2015  CLINICAL DATA:  Shortness of breath. Pleural effusion. History pulmonary embolism. EXAM: CT ANGIOGRAPHY CHEST WITH CONTRAST TECHNIQUE: Multidetector CT imaging of the chest was performed using the standard protocol during bolus administration of intravenous contrast. Multiplanar CT image reconstructions and MIPs were obtained to evaluate the vascular anatomy. CONTRAST:  139m OMNIPAQUE IOHEXOL 350 MG/ML SOLN COMPARISON:  03/27/2015 FINDINGS: THORACIC INLET/BODY WALL: Body wall edema and extensive venous collaterals, both intra and extra thoracic, secondary to chronic SVC obstruction. No axillary or supraclavicular adenopathy detected. MEDIASTINUM: No cardiomegaly or pericardial effusion. Dual-chamber pacer from the right. Suboptimal opacification of the pulmonary arterial tree due to SVC obstruction with bolus dispersion and intermittent motion. Right-sided pulmonary arteries are distorted and attenuated without central acute filling defect. Previous pulmonary embolism to the right lower lobe, with peripheral thick walled appearance compatible with endothelialized clot. No convincing pulmonary embolism, limited in subsegmental arteries. No acute aortic finding. No progressive mediastinal adenopathy. LUNG WINDOWS: Extensive atelectasis and scarring around the right hilum with bronchial distortion. There is a progressive and large layering right pleural effusion without  pleural nodularity or thickening. No superimposed consolidation or edema. UPPER ABDOMEN: Stable bilateral adrenal nodularity, presumed metastases. No acute finding. OSSEOUS: No acute finding. Review of the MIP images confirms the above findings. IMPRESSION: 1. No convincing acute pulmonary embolism. 2. Partial re- cannulization of recent right lower lobe pulmonary embolism. 3. Chronic SVC obstruction which limits CTA. 4. Large and progressive layering right pleural effusion. Electronically Signed   By: JMonte FantasiaM.D.   On: 05/05/2015 08:42   Dg Abd 2 Views  05/05/2015  CLINICAL DATA:  Acute onset of mid abdominal pain and constipation. Initial encounter. EXAM: ABDOMEN - 2 VIEW COMPARISON:  CT of the abdomen and pelvis performed 03/27/2015 FINDINGS: The visualized bowel gas pattern is unremarkable. Scattered air and stool filled loops of colon are seen; no abnormal dilatation of small bowel loops is seen to suggest small bowel obstruction. No free intra-abdominal air is identified, though evaluation for free air is limited on a single supine view. The visualized osseous structures  are within normal limits; the sacroiliac joints are unremarkable in appearance. A large right-sided pleural effusion is partially imaged. Pacemaker leads are partially seen. An IVC filter is noted. Brachytherapy seeds are seen overlying the prostate bed. IMPRESSION: Unremarkable bowel gas pattern; no free intra-abdominal air seen. Small to moderate amount of stool noted in the colon. Electronically Signed   By: Garald Balding M.D.   On: 05/05/2015 06:38   US Thoracentesis Asp Pleural Space W/img Guide  05/25/2015  INDICATION: Patient with stage IV lung cancer, prostate cancer, recurrent PE, dyspnea, recurrent right pleural effusion. Request is made for diagnostic and therapeutic right thoracentesis. EXAM: ULTRASOUND GUIDED DIAGNOSTIC AND THERAPEUTIC RIGHT THORACENTESIS COMPARISON:  Prior thoracentesis on 05/05/2015  MEDICATIONS: None COMPLICATIONS: None immediate TECHNIQUE: Informed written consent was obtained from the patient after a discussion of the risks, benefits and alternatives to treatment. A timeout was performed prior to the initiation of the procedure. Initial ultrasound scanning demonstrates a small to moderate right pleural effusion. The lower chest was prepped and draped in the usual sterile fashion. 1% lidocaine was used for local anesthesia. An ultrasound image was saved for documentation purposes. A 6 Fr Safe-T-Centesis catheter was introduced. The thoracentesis was performed. The catheter was removed and a dressing was applied. The patient tolerated the procedure well without immediate post procedural complication. The patient was escorted to have an upright chest radiograph. FINDINGS: A total of approximately 630 cc of hazy, blood-tinged fluid was removed. Requested samples were sent to the laboratory. IMPRESSION: Successful ultrasound-guided diagnostic and therapeutic right sided thoracentesis yielding 630 cc of pleural fluid. Read by: Rowe Robert, PA-C Electronically Signed   By: Sandi Mariscal M.D.   On: 05/25/2015 16:01   US Thoracentesis Asp Pleural Space W/img Guide  05/05/2015  CLINICAL DATA:  79 year old male with a history of new right-sided pleural effusion. He has been referred for diagnostic and therapeutic thoracentesis. EXAM: ULTRASOUND GUIDED right THORACENTESIS COMPARISON:  CT 05/05/2015 PROCEDURE: An ultrasound guided thoracentesis was thoroughly discussed with the patient and questions answered. The benefits, risks, alternatives and complications were also discussed. The patient understands and wishes to proceed with the procedure. Written consent was obtained. Ultrasound was performed to localize and mark an adequate pocket of fluid in the right chest. The area was then prepped and draped in the normal sterile fashion. 1% Lidocaine was used for local anesthesia. Under ultrasound guidance  a Safe-T-Centesis catheter was introduced. Thoracentesis was performed. The catheter was removed and a dressing applied. Patient tolerated the procedure well and remained hemodynamically stable throughout. No complications encountered. COMPLICATIONS: None FINDINGS: A total of approximately 650 cc of serosanguineous fluid was removed. A fluid sample wassent for laboratory analysis. IMPRESSION: Status post right-sided thoracentesis.  650 cc of fluid removed. Sample was sent to the lab. Signed, Dulcy Fanny. Earleen Newport, DO [facility] Radiology Electronically Signed   By: Corrie Mckusick D.O.   On: 05/05/2015 18:26    Microbiology: Recent Results (from the past 240 hour(s))  Culture, expectorated sputum-assessment     Status: None   Collection Time: 05/25/15  1:30 PM  Result Value Ref Range Status   Specimen Description SPUTUM  Final   Special Requests NONE  Final   Sputum evaluation   Final    THIS SPECIMEN IS ACCEPTABLE. RESPIRATORY CULTURE REPORT TO FOLLOW.   Report Status 05/25/2015 FINAL  Final  Culture, respiratory (NON-Expectorated)     Status: None (Preliminary result)   Collection Time: 05/25/15  1:30 PM  Result Value Ref Range Status   Specimen Description SPUTUM  Final   Special Requests NONE  Final   Gram Stain   Final    FEW WBC PRESENT, PREDOMINANTLY MONONUCLEAR MODERATE SQUAMOUS EPITHELIAL CELLS PRESENT MODERATE GRAM POSITIVE COCCI IN PAIRS IN CLUSTERS MODERATE GRAM NEGATIVE RODS Performed at [facility]    Culture   Final    Culture reincubated for better growth Performed at [facility]    Report Status PENDING  Incomplete  Culture, body fluid-bottle     Status: None (Preliminary result)   Collection Time: 05/25/15  3:57 PM  Result Value Ref Range Status   Specimen Description FLUID PLEURAL RIGHT  Final   Special Requests BOTTLES DRAWN AEROBIC AND ANAEROBIC 10CC  Final   Culture NO GROWTH 2 DAYS  Final   Report  Status PENDING  Incomplete  Gram stain     Status: None   Collection Time: 05/25/15  3:57 PM  Result Value Ref Range Status   Specimen Description FLUID PLEURAL RIGHT  Final   Special Requests BOTTLES DRAWN AEROBIC AND ANAEROBIC 10CC  Final   Gram Stain   Final    CYTOSPIN SMEAR WBC PRESENT, PREDOMINANTLY MONONUCLEAR NO ORGANISMS SEEN    Report Status 05/25/2015 FINAL  Final     Labs: CBC:  Recent Labs Lab 05/21/15 1628 05/22/15 0110 05/22/15 1543 05/23/15 0540 05/24/15 0523 05/25/15 0521 05/26/15 0235 05/27/15 0450  WBC 5.8 5.4 5.8 5.1 4.2 4.3 4.3 4.5  NEUTROABS 5.1 4.9 4.9  --  3.1 3.2  --   --   HGB 8.8* 8.6* 8.6* 8.1* 8.3* 8.5* 8.9* 8.7*  HCT 28.8* 28.3* 28.4* 27.1* 27.2* 27.5* 28.6* 28.2*  MCV 92.6 94.0 94.0 93.4 94.1 93.9 96.3 95.3  PLT 47*  47* 44* 53* 39* 37* 34* 36* 32*   Basic Metabolic Panel:  Recent Labs Lab 05/21/15 1103 05/22/15 1543 05/24/15 0523 05/25/15 0956 05/26/15 0235  NA 139 137 138 141 142  K 4.2 4.3 3.8 3.4* 4.3  CL 102 103 103 105 106  CO2 '25 23 30 29 29  '$ GLUCOSE 121* 160* 82 76 92  BUN 30* 23* 21* 21* 19  CREATININE 0.78 0.71 0.72 0.74 0.66  CALCIUM 9.8 9.2 9.0 9.4 8.9  MG  --   --   --  1.9  --    Liver Function Tests:  Recent Labs Lab 05/22/15 1543 05/24/15 0523 05/25/15 0956  AST '24 17 17  '$ ALT '31 29 25  '$ ALKPHOS 71 67 76  BILITOT 1.1 1.1 1.6*  PROT 6.3* 5.8* 6.2*  ALBUMIN 3.5 3.2* 3.5   No results for input(s): LIPASE, AMYLASE in the last 168 hours. No results for input(s): AMMONIA in the last 168 hours.  Cardiac Enzymes:  Recent Labs Lab 05/21/15 1103 05/25/15 0956 05/25/15 2002 05/26/15 0235  TROPONINI <0.03 <0.03 <0.03 <0.03   BNP (last 3 results)  Recent Labs  07/21/14 1821 05/05/15 0556 05/21/15 1104  BNP 39.7 74.3 79.0    ProBNP (last 3 results) No results for input(s): PROBNP in the last 8760 hours.  CBG:  Recent Labs Lab 05/23/15 0732  GLUCAP 87    Time spent: 30  minutes  Signed:  PATEL, PRANAV  [facility] 05/27/2015, 11:56 AM

## 2015-05-26 NOTE — Progress Notes (Addendum)
ANTICOAGULATION CONSULT NOTE - Follow Up Consult  Pharmacy Consult for Lovenox Indication: pulmonary embolus  No Known Allergies  Patient Measurements: Height: 6' (182.9 cm) Weight: 163 lb 9.3 oz (74.2 kg) IBW/kg (Calculated) : 77.6  Vital Signs: Temp: 97.8 F (36.6 C) (12/13 0506) Temp Source: Oral (12/13 0506) BP: 123/75 mmHg (12/13 0506) Pulse Rate: 73 (12/13 0506)  Labs:  Recent Labs  05/24/15 0523 05/25/15 0521 05/25/15 0956 05/25/15 2002 05/26/15 0235  HGB 8.3* 8.5*  --   --  8.9*  HCT 27.2* 27.5*  --   --  28.6*  PLT 37* 34*  --   --  36*  CREATININE 0.72  --  0.74  --   --   TROPONINI  --   --  <0.03 <0.03 <0.03    Estimated Creatinine Clearance: 81.2 mL/min (by C-G formula based on Cr of 0.74).   Medications:  Scheduled:  . budesonide-formoterol  2 puff Inhalation BID  . dexamethasone  4 mg Oral Daily  . enoxaparin (LOVENOX) injection  1 mg/kg Subcutaneous Q12H  . famotidine  20 mg Oral QHS  . fluticasone  2 spray Each Nare Daily  . gabapentin  300 mg Oral TID  . guaiFENesin  600 mg Oral BID  . levothyroxine  50 mcg Oral QAC breakfast  . mirtazapine  30 mg Oral QHS  . sodium chloride  3 mL Intravenous Q12H  . tamsulosin  0.4 mg Oral QHS   Infusions:    Assessment: 79 y.o. 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  Patient presents with complaints of SOB primarily worsening on laying flat associated with diaphoresis. CT scan is positive for acute left PE (prior PE was on the right).   Baseline Plt wnl, but now with new thrombocytopenia. Seen by hematology who feels this is primarily consumptive from PE and recommends switching to Lovenox.  HIT/DIC panels have been sent.  Today, 05/26/2015:  CBC: Plt stable; Hgb low but stable. No active bleeding noted  Seen by hem/onc 12/12 re:  thrombocytopenia, thought to be consumptive  HIT plt ab: NEGATIVE  CrCl 81 ml/min  Previous anticoagulation: Xarelto 20 mg daily   Goal of Therapy: Anti-Xa level 0.6-1 units/ml 4hrs after LMWH dose given  Plan:  HIT negative, for easier administration change enoxaparin to 70 mg SQ q12 hr  F/u SCr; CBC at least q72 hr while on Lovenox (currently CBC ordered daily)  Monitor for signs of bleeding or worsening thrombocytopenia   Doreene Eland, PharmD, BCPS.   Pager: [phone] 05/26/2015 7:31 AM

## 2015-05-26 NOTE — Progress Notes (Signed)
[facility] Progress Note    Patient: Nicholas Schmitt    IDP:[id number]  DOB: 08-26-1935     DOA: 05/21/2015 Date of Service: the patient was seen and examined on 05/26/2015  Subjective: Patient denies any acute complaints of dizziness or lightheadedness. No further episodes of syncope. No nausea no vomiting. No active bleeding. Telemetry overnight and does not show any acute events as well. Nutrition: Able to tolerate oral diet. Activity: Ambulating in the room. Last BM: 05/26/2015.  Assessment and Plan: 1. Recurrent pulmonary embolism (HCC) Xarelto failure. Status post IVC filter 9/16  Heparin induced antibody level 0.354, less than diagnostic, thus negative.  Platelet counts are stabilizing appreciate hematology input.  Continue Lovenox was adjusted to 70 mg daily. Upper extremity Doppler is positive for a new DVT on SVC. 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  2. thrombocytopenia. Appears to be consumptive due to acute embolism. DIC panel negative for any hemolysis thrombo-angiopathy, smear negative for schistocytes. Appreciate input from hematology. Continue therapeutic anticoagulation  3. History of radiation therapy  Non-small cell carcinoma of lung, stage 4 ([place])  H/O craniotomy 11/2014 Continuing Decadron at present. Physical therapy recommend SNF.  4 Hypothyroidism Continue Synthroid  5 Presence of permanent cardiac pacemaker No events telemetry  6. Healthcare associated pneumonia. Patient's chest x-ray showed patchy infiltrate. Sputum is growing gram-positive cocci in pairs as well as gram-negative rods. Identification is pending. With concern for left shift as well as recent events of syncope patient will be started on levofloxacin to complete a 7 day treatment course her pharmacy. We will follow the response.  7. Syncopal  event. Etiology currently unclear but possibility of atrial myxoma causing syncope cannot be ruled out. Telemetry serial troponin and CMP are unremarkable. Limited echocardiogram does not show any acute abnormality and showed presence of atrial myxoma as well. Ejection fraction 55-60%.  8. Recurrent right pleural effusion, patchy opacities bilaterally. Underwent ultrasound-guided thoracentesis. Workup shows transudative effusion and repeat x-ray does not show any patchy infiltrate. Continue monitoring.  Nutrition: Regular diet  Advance goals of care discussion: DNR/DNI.  Brief Summary of Hospitalization:  HPI: As per the H and P dictated on admission, "patient presents with complains of shortness of breath, was found to be having acute on chronic pulmonary embolism" Daily update: 05/23/2015, patient transitioned to Lovenox 05/25/2015 had a syncopal event. 05/25/2015 underwent ultrasound-guided thoracentesis. 05/26/2015 started on antibiotic. Consultants:Hematology  Procedures:Ultrasound-guided thoracentesis  Limited echocardiogram  Antibiotics: Anti-infectives    Start     Dose/Rate Route Frequency Ordered Stop   05/26/15 1100  levofloxacin (LEVAQUIN) tablet 750 mg     750 mg Oral Daily 05/26/15 0939     05/26/15 0000  levofloxacin (LEVAQUIN) 750 MG tablet     750 mg Oral Daily 05/26/15 1153        Family Communication: family was present at bedside, at the time of interview.  Opportunity was given to ask question and all questions were answered satisfactorily.   Disposition:  Expected discharge date: 05/27/2015 Barriers to safe discharge: Response to antibiotic   Intake/Output Summary (Last 24 hours) at 05/26/15 1655 Last data filed at 05/26/15 1215  Gross per 24 hour  Intake   1080 ml  Output      3 ml  Net   1077 ml   Filed Weights   05/24/15 0451 05/25/15 0446 05/26/15 0506  Weight: 73.256 kg (161 lb 8  oz) 72.9 kg (160 lb 11.5 oz) 74.2 kg (163 lb 9.3 oz)     Objective: Physical Exam: Filed Vitals:   05/25/15 2017 05/26/15 0506 05/26/15 1129 05/26/15 1315  BP: 103/71 123/75  104/65  Pulse: 90 73  96  Temp: 97.5 F (36.4 C) 97.8 F (36.6 C)  97.5 F (36.4 C)  TempSrc: Oral Oral  Oral  Resp: '18 18  24  '$ Height:      Weight:  74.2 kg (163 lb 9.3 oz)    SpO2: 97% 98% 96% 99%     General: Appear in mild distress, no Rash; Oral Mucosa moist. Cardiovascular: S1 and S2 Present, no Murmur, no JVD Respiratory: Bilateral Air entry present and right-sided Crackles, no wheezes Abdomen: Bowel Sound present, Soft and n tenderness Extremities: no Pedal edema, no calf tenderness Neurology: Grossly no focal neuro deficit.  Data Reviewed: CBC:  Recent Labs Lab 05/21/15 1628 05/22/15 0110 05/22/15 1543 05/23/15 0540 05/24/15 0523 05/25/15 0521 05/26/15 0235  WBC 5.8 5.4 5.8 5.1 4.2 4.3 4.3  NEUTROABS 5.1 4.9 4.9  --  3.1 3.2  --   HGB 8.8* 8.6* 8.6* 8.1* 8.3* 8.5* 8.9*  HCT 28.8* 28.3* 28.4* 27.1* 27.2* 27.5* 28.6*  MCV 92.6 94.0 94.0 93.4 94.1 93.9 96.3  PLT 47*  47* 44* 53* 39* 37* 34* 36*   Basic Metabolic Panel:  Recent Labs Lab 05/21/15 1103 05/22/15 1543 05/24/15 0523 05/25/15 0956 05/26/15 0235  NA 139 137 138 141 142  K 4.2 4.3 3.8 3.4* 4.3  CL 102 103 103 105 106  CO2 '25 23 30 29 29  '$ GLUCOSE 121* 160* 82 76 92  BUN 30* 23* 21* 21* 19  CREATININE 0.78 0.71 0.72 0.74 0.66  CALCIUM 9.8 9.2 9.0 9.4 8.9  MG  --   --   --  1.9  --    Liver Function Tests:  Recent Labs Lab 05/22/15 1543 05/24/15 0523 05/25/15 0956  AST '24 17 17  '$ ALT '31 29 25  '$ ALKPHOS 71 67 76  BILITOT 1.1 1.1 1.6*  PROT 6.3* 5.8* 6.2*  ALBUMIN 3.5 3.2* 3.5   No results for input(s): LIPASE, AMYLASE in the last 168 hours. No results for input(s): AMMONIA in the last 168 hours.  Cardiac Enzymes:  Recent Labs Lab 05/21/15 1103 05/25/15 0956 05/25/15 2002 05/26/15 0235  TROPONINI <0.03 <0.03 <0.03 <0.03   BNP (last 3  results)  Recent Labs  07/21/14 1821 05/05/15 0556 05/21/15 1104  BNP 39.7 74.3 79.0    ProBNP (last 3 results) No results for input(s): PROBNP in the last 8760 hours.   CBG:  Recent Labs Lab 05/23/15 0732  GLUCAP 87    Recent Results (from the past 240 hour(s))  Culture, expectorated sputum-assessment     Status: None   Collection Time: 05/25/15  1:30 PM  Result Value Ref Range Status   Specimen Description SPUTUM  Final   Special Requests NONE  Final   Sputum evaluation   Final    THIS SPECIMEN IS ACCEPTABLE. RESPIRATORY CULTURE REPORT TO FOLLOW.   Report Status 05/25/2015 FINAL  Final  Culture, respiratory (NON-Expectorated)     Status: None (Preliminary result)   Collection Time: 05/25/15  1:30 PM  Result Value Ref Range Status   Specimen Description SPUTUM  Final   Special Requests NONE  Final   Gram Stain   Final    FEW WBC PRESENT, PREDOMINANTLY MONONUCLEAR MODERATE SQUAMOUS EPITHELIAL CELLS PRESENT MODERATE GRAM POSITIVE COCCI IN  PAIRS IN CLUSTERS MODERATE GRAM NEGATIVE RODS Performed at [facility]    Culture PENDING  Incomplete   Report Status PENDING  Incomplete  Culture, body fluid-bottle     Status: None (Preliminary result)   Collection Time: 05/25/15  3:57 PM  Result Value Ref Range Status   Specimen Description FLUID PLEURAL RIGHT  Final   Special Requests BOTTLES DRAWN AEROBIC AND ANAEROBIC 10CC  Final   Culture NO GROWTH < 24 HOURS  Final   Report Status PENDING  Incomplete  Gram stain     Status: None   Collection Time: 05/25/15  3:57 PM  Result Value Ref Range Status   Specimen Description FLUID PLEURAL RIGHT  Final   Special Requests BOTTLES DRAWN AEROBIC AND ANAEROBIC 10CC  Final   Gram Stain   Final    CYTOSPIN SMEAR WBC PRESENT, PREDOMINANTLY MONONUCLEAR NO ORGANISMS SEEN    Report Status 05/25/2015 FINAL  Final     Studies: No results found.   Scheduled Meds: . budesonide-formoterol  2 puff Inhalation BID  .  dexamethasone  4 mg Oral Daily  . enoxaparin (LOVENOX) injection  70 mg Subcutaneous Q12H  . famotidine  20 mg Oral QHS  . fluticasone  2 spray Each Nare Daily  . gabapentin  300 mg Oral TID  . guaiFENesin  600 mg Oral BID  . levofloxacin  750 mg Oral Daily  . levothyroxine  50 mcg Oral QAC breakfast  . mirtazapine  30 mg Oral QHS  . sodium chloride  3 mL Intravenous Q12H  . tamsulosin  0.4 mg Oral QHS   Continuous Infusions:  PRN Meds: acetaminophen **OR** acetaminophen, albuterol, ALPRAZolam, benzonatate, docusate sodium, nitroGLYCERIN, ondansetron **OR** ondansetron (ZOFRAN) IV, oxyCODONE, senna  Time spent: 30 minutes  Author: Berle Mull, MD [facility] Pager: [phone] 05/26/2015 4:55 PM  If 7PM-7AM, please contact night-coverage at [email or web address], password [facility]

## 2015-05-26 NOTE — Progress Notes (Signed)
CSW advised of patient being ready for dc today- [name] indicates no bed available- CSW has contacted family and SNF search underway for bed for dc today.  Eduard Clos, MSW, [place]

## 2015-05-26 NOTE — Progress Notes (Signed)
ANTIBIOTIC CONSULT NOTE - INITIAL  Pharmacy Consult for levofloxacin (PO) Indication: pneumonia  No Known Allergies  Patient Measurements: Height: 6' (182.9 cm) Weight: 163 lb 9.3 oz (74.2 kg) IBW/kg (Calculated) : 77.6 Adjusted Body Weight:   Vital Signs: Temp: 97.8 F (36.6 C) (12/13 0506) Temp Source: Oral (12/13 0506) BP: 123/75 mmHg (12/13 0506) Pulse Rate: 73 (12/13 0506) Intake/Output from previous day: 12/12 0701 - 12/13 0700 In: 840 [P.O.:840] Out: 201 [Urine:200; Stool:1] Intake/Output from this shift: Total I/O In: 480 [P.O.:480] Out: -   Labs:  Recent Labs  05/24/15 0523 05/25/15 0521 05/25/15 0956 05/26/15 0235  WBC 4.2 4.3  --  4.3  HGB 8.3* 8.5*  --  8.9*  PLT 37* 34*  --  36*  CREATININE 0.72  --  0.74  --    Estimated Creatinine Clearance: 81.2 mL/min (by C-G formula based on Cr of 0.74). No results for input(s): VANCOTROUGH, VANCOPEAK, VANCORANDOM, GENTTROUGH, GENTPEAK, GENTRANDOM, TOBRATROUGH, TOBRAPEAK, TOBRARND, AMIKACINPEAK, AMIKACINTROU, AMIKACIN in the last 72 hours.   Microbiology: Recent Results (from the past 720 hour(s))  MRSA PCR Screening     Status: None   Collection Time: 05/05/15  9:35 AM  Result Value Ref Range Status   MRSA by PCR NEGATIVE NEGATIVE Final    Comment:        The GeneXpert MRSA Assay (FDA approved for NASAL specimens only), is one component of a comprehensive MRSA colonization surveillance program. It is not intended to diagnose MRSA infection nor to guide or monitor treatment for MRSA infections.   Body fluid culture     Status: None   Collection Time: 05/05/15  6:13 PM  Result Value Ref Range Status   Specimen Description PLEURAL RIGHT  Final   Special Requests NONE  Final   Gram Stain   Final    MODERATE WBC PRESENT, PREDOMINANTLY MONONUCLEAR NO ORGANISMS SEEN    Culture   Final    NO GROWTH 3 DAYS Performed at [facility]    Report Status 05/09/2015 FINAL  Final  Culture, blood  (routine x 2)     Status: None   Collection Time: 05/05/15  6:36 PM  Result Value Ref Range Status   Specimen Description BLOOD RIGHT ANTECUBITAL  Final   Special Requests BOTTLES DRAWN AEROBIC AND ANAEROBIC 3CC  Final   Culture   Final    NO GROWTH 5 DAYS Performed at [facility]    Report Status 05/10/2015 FINAL  Final  Culture, blood (routine x 2)     Status: None   Collection Time: 05/05/15  6:44 PM  Result Value Ref Range Status   Specimen Description BLOOD RIGHT HAND  Final   Special Requests BOTTLES DRAWN AEROBIC AND ANAEROBIC 2CC  Final   Culture   Final    NO GROWTH 5 DAYS Performed at [facility]    Report Status 05/10/2015 FINAL  Final  Culture, expectorated sputum-assessment     Status: None   Collection Time: 05/25/15  1:30 PM  Result Value Ref Range Status   Specimen Description SPUTUM  Final   Special Requests NONE  Final   Sputum evaluation   Final    THIS SPECIMEN IS ACCEPTABLE. RESPIRATORY CULTURE REPORT TO FOLLOW.   Report Status 05/25/2015 FINAL  Final  Culture, respiratory (NON-Expectorated)     Status: None (Preliminary result)   Collection Time: 05/25/15  1:30 PM  Result Value Ref Range Status   Specimen Description SPUTUM  Final  Special Requests NONE  Final   Gram Stain   Final    FEW WBC PRESENT, PREDOMINANTLY MONONUCLEAR MODERATE SQUAMOUS EPITHELIAL CELLS PRESENT MODERATE GRAM POSITIVE COCCI IN PAIRS IN CLUSTERS MODERATE GRAM NEGATIVE RODS Performed at [facility]    Culture PENDING  Incomplete   Report Status PENDING  Incomplete  Gram stain     Status: None   Collection Time: 05/25/15  3:57 PM  Result Value Ref Range Status   Specimen Description FLUID PLEURAL RIGHT  Final   Special Requests BOTTLES DRAWN AEROBIC AND ANAEROBIC 10CC  Final   Gram Stain   Final    CYTOSPIN SMEAR WBC PRESENT, PREDOMINANTLY MONONUCLEAR NO ORGANISMS SEEN    Report Status 05/25/2015 FINAL  Final    Medical History: Past Medical  History  Diagnosis Date  . CAD (coronary artery disease)     positive stress test in 2006 led to left heart cath (8/06) showing 95% prox RCA, 90% CFX, and 90% mLAD.  patient had a cypher DES to all 3 lesions  . Hyperlipidemia   . Hearing loss   . AAA (abdominal aortic aneurysm) ([place]) 11/09    3.2 cm   . Chronic renal insufficiency   . History of radiation therapy 03/08/04- 04/08/04    prostate 4600 cGy in 3 fractions, radioactive seed implant 05/04/04  . Myocardial infarction ([place]) 1995  . Pacemaker 2014  . Shortness of breath     exertion  . COPD (chronic obstructive pulmonary disease) ([place])   . Radiation Jan.11-Jan 29/2016    Right central chest 35 gray in 14 fractions  . Radiation Jan.2016    35 gray in 14 fx lower lumbar/upper sacrum  . Diverticulosis   . Hemorrhoids   . Pneumonia     hx  . GERD (gastroesophageal reflux disease)   . Hypertension     no med now for 3-4 months  . Prostate cancer ([place]) 2005    gleason 7  . Skin cancer     ear  . Lung cancer ([place])   . Cancer [facility]) 2015    prostate   . Cancer [facility]) December 04, 2014    Cranial   . Cancer [facility]) 04/01/2015    spine    Assessment: 34 YOM presented with syncope and found to have PE (recurrent), patient known to pharmacy for LMWH dosing).  Pharmacy asked to dose levofloxacin for pneumonia (transudative fluid per thoracentesis 12/12).   12/12 >> levofloxacin >>  12/12 blood: pending 12/12 sputum:  12/12 pleural fluid Strep Ag: negative Legionella Ag: pending  Dose changes/levels:  Goal of Therapy:  Dose for indication and for patient-specific parameters  Plan:   Levofloxacin '750mg'$  PO daily  Doreene Eland, PharmD, BCPS.   Pager: [phone] 05/26/2015 9:38 AM

## 2015-05-26 NOTE — Progress Notes (Signed)
Physical Therapy Treatment Patient Details Name: Nicholas Schmitt MRN: [id number] DOB: 09-Nov-1935 Today's Date: 05/26/2015    History of Present Illness 79 yo male admitted with recurrent pulmonary embolism. History significant for coronary artery disease, COPD Gold stage II, AAA, chronic kidney disease stage II, non-small cell lung cancer stage IV, status post craniotomy with tumor excision of brain metastasis on 12/04/14, recent history of pulmonary embolism, now on Xarelto and status post IVC filter placement on 54/27 without complications. Patient is also status post stereotactic radiosurgery for spinal metastasis on 04/16/15.    PT Comments    Pt ambulated 200' with RW and supervision. SaO2 86% on 4L O2 walking, 92% on 6L O2 Micro. Performed seated therapeutic exercises BUE/LEs.   Follow Up Recommendations  SNF     Equipment Recommendations  None recommended by PT    Recommendations for Other Services       Precautions / Restrictions Precautions Precautions: Fall Precaution Comments: monitor O2 Restrictions Weight Bearing Restrictions: No    Mobility  Bed Mobility               General bed mobility comments: NT-up in recliner  Transfers Overall transfer level: Needs assistance Equipment used: Rolling walker (2 wheeled)   Sit to Stand: Supervision         General transfer comment: cues for safety  Ambulation/Gait Ambulation/Gait assistance: Supervision Ambulation Distance (Feet): 200 Feet Assistive device: Rolling walker (2 wheeled)     Gait velocity interpretation: at or above normal speed for age/gender General Gait Details: SaO2 86% on 4L O2, 92% on 6L O2 with ambulation, verbal cues for pursed lip breathing, 3/4 dyspnea   Stairs            Wheelchair Mobility    Modified Rankin (Stroke Patients Only)       Balance     Sitting balance-Leahy Scale: Good       Standing balance-Leahy Scale: Fair                       Cognition Arousal/Alertness: Awake/alert Behavior During Therapy: WFL for tasks assessed/performed Overall Cognitive Status: Within Functional Limits for tasks assessed                      Exercises General Exercises - Upper Extremity Shoulder Flexion: AROM;Both;10 reps;Seated General Exercises - Lower Extremity Long Arc Quad: AROM;Both;10 reps;Seated Hip Flexion/Marching: AROM;15 reps;Both;Seated    General Comments        Pertinent Vitals/Pain Pain Assessment: No/denies pain Pain Score: 0-No pain    Home Living                      Prior Function            PT Goals (current goals can now be found in the care plan section) Acute Rehab PT Goals Patient Stated Goal: to go to rehab to get stronger and to improve safe mobility at home; "get back to going to yard sales" PT Goal Formulation: With patient/family Time For Goal Achievement: 06/07/15 Potential to Achieve Goals: Good Progress towards PT goals: Progressing toward goals    Frequency  Min 3X/week    PT Plan Current plan remains appropriate    Co-evaluation             End of Session Equipment Utilized During Treatment: Gait belt;Oxygen Activity Tolerance: Patient tolerated treatment well Patient left: in chair;with call bell/phone within reach;with chair alarm  set;with family/visitor present     Time: 8757-9728 PT Time Calculation (min) (ACUTE ONLY): 14 min  Charges:  $Gait Training: 8-22 mins                    G Codes:      Philomena Doheny 05/26/2015, 2:23 PM [phone]

## 2015-05-27 DIAGNOSIS — J189 Pneumonia, unspecified organism: Secondary | ICD-10-CM

## 2015-05-27 LAB — CBC
HEMATOCRIT: 28.2 % — AB (ref 39.0–52.0)
HEMOGLOBIN: 8.7 g/dL — AB (ref 13.0–17.0)
MCH: 29.4 pg (ref 26.0–34.0)
MCHC: 30.9 g/dL (ref 30.0–36.0)
MCV: 95.3 fL (ref 78.0–100.0)
Platelets: 32 10*3/uL — ABNORMAL LOW (ref 150–400)
RBC: 2.96 MIL/uL — ABNORMAL LOW (ref 4.22–5.81)
RDW: 24.4 % — ABNORMAL HIGH (ref 11.5–15.5)
WBC: 4.5 10*3/uL (ref 4.0–10.5)

## 2015-05-27 LAB — LEGIONELLA ANTIGEN, URINE

## 2015-05-27 MED ORDER — FOLIC ACID 1 MG PO TABS: 1.0000 mg | ORAL_TABLET | Freq: Every day | ORAL | Status: AC

## 2015-05-27 MED ORDER — FERROUS SULFATE 325 (65 FE) MG PO TABS
325.0000 mg | ORAL_TABLET | Freq: Every day | ORAL | Status: DC
Start: 2015-05-27 — End: 2015-05-27
  Administered 2015-05-27: 325 mg via ORAL
  Filled 2015-05-27: qty 1

## 2015-05-27 MED ORDER — ENOXAPARIN SODIUM 80 MG/0.8ML ~~LOC~~ SOLN: 70.0000 mg | Freq: Two times a day (BID) | SUBCUTANEOUS | Status: DC

## 2015-05-27 MED ORDER — FOLIC ACID 1 MG PO TABS
1.0000 mg | ORAL_TABLET | Freq: Every day | ORAL | Status: DC
  Administered 2015-05-27: 1 mg via ORAL
  Filled 2015-05-27: qty 1

## 2015-05-27 MED ORDER — FERROUS SULFATE 325 (65 FE) MG PO TABS: 325.0000 mg | ORAL_TABLET | Freq: Every day | ORAL | Status: AC

## 2015-05-27 NOTE — Progress Notes (Signed)
Cardiac monitoring discontinued as per MD order.  Lind Guest, RN

## 2015-05-27 NOTE — Progress Notes (Signed)
Patient for d/c today to SNF bed at [facility] - SNF auth rec'd. Family and patient agreeable to this plan- plan transfer via EMS. Eduard Clos, MSW, [place]

## 2015-05-27 NOTE — Progress Notes (Signed)
Report called to nurse at [facility].  Lind Guest, RN

## 2015-05-28 ENCOUNTER — Telehealth: Payer: Self-pay | Admitting: Internal Medicine

## 2015-05-28 ENCOUNTER — Ambulatory Visit: Payer: Medicare Other | Admitting: Physical Therapy

## 2015-05-28 LAB — CULTURE, RESPIRATORY W GRAM STAIN

## 2015-05-28 LAB — CULTURE, RESPIRATORY: CULTURE: NORMAL

## 2015-05-28 NOTE — Telephone Encounter (Signed)
New Message  Pt wife calling to speak w/ Device. Pt is now at [facility] and has remote sched for 12/19. Pt wife wondered if they could do the signal check @ [facility]. Please call back and discuss.   Can leave detailed vm on listed # or call pt wife cell- [phone]

## 2015-05-28 NOTE — Telephone Encounter (Signed)
LMOVM for pt to return call 

## 2015-05-29 NOTE — Telephone Encounter (Signed)
Spoke w/ [name] and informed her that since the patient has the wirex adapter he will no longer need a land line phone is use his home monitor. [name] verbalized understanding and said she would call back later today to get help hooking up the monitor.

## 2015-05-30 LAB — CULTURE, BODY FLUID-BOTTLE

## 2015-05-30 LAB — CULTURE, BODY FLUID W GRAM STAIN -BOTTLE: Culture: NO GROWTH

## 2015-05-31 LAB — CULTURE, BLOOD (ROUTINE X 2)
CULTURE: NO GROWTH
CULTURE: NO GROWTH

## 2015-06-01 ENCOUNTER — Ambulatory Visit (INDEPENDENT_AMBULATORY_CARE_PROVIDER_SITE_OTHER): Payer: Medicare Other | Admitting: *Deleted

## 2015-06-01 ENCOUNTER — Encounter: Payer: Medicare Other | Admitting: Occupational Therapy

## 2015-06-01 DIAGNOSIS — I495 Sick sinus syndrome: Secondary | ICD-10-CM | POA: Diagnosis not present

## 2015-06-02 ENCOUNTER — Ambulatory Visit (HOSPITAL_BASED_OUTPATIENT_CLINIC_OR_DEPARTMENT_OTHER): Payer: Medicare Other | Admitting: Internal Medicine

## 2015-06-02 ENCOUNTER — Ambulatory Visit: Payer: Medicare Other

## 2015-06-02 ENCOUNTER — Encounter: Payer: Self-pay | Admitting: Internal Medicine

## 2015-06-02 ENCOUNTER — Other Ambulatory Visit (HOSPITAL_BASED_OUTPATIENT_CLINIC_OR_DEPARTMENT_OTHER): Payer: Medicare Other

## 2015-06-02 ENCOUNTER — Other Ambulatory Visit: Payer: Self-pay | Admitting: *Deleted

## 2015-06-02 VITALS — BP 114/61 | HR 102 | Temp 97.6°F | Resp 17 | Ht 72.0 in | Wt 176.7 lb

## 2015-06-02 DIAGNOSIS — C7951 Secondary malignant neoplasm of bone: Secondary | ICD-10-CM

## 2015-06-02 DIAGNOSIS — Z86711 Personal history of pulmonary embolism: Secondary | ICD-10-CM

## 2015-06-02 DIAGNOSIS — D696 Thrombocytopenia, unspecified: Secondary | ICD-10-CM | POA: Diagnosis not present

## 2015-06-02 DIAGNOSIS — I2699 Other pulmonary embolism without acute cor pulmonale: Secondary | ICD-10-CM

## 2015-06-02 DIAGNOSIS — Z86718 Personal history of other venous thrombosis and embolism: Secondary | ICD-10-CM

## 2015-06-02 DIAGNOSIS — C3491 Malignant neoplasm of unspecified part of right bronchus or lung: Secondary | ICD-10-CM

## 2015-06-02 DIAGNOSIS — C3490 Malignant neoplasm of unspecified part of unspecified bronchus or lung: Secondary | ICD-10-CM | POA: Diagnosis not present

## 2015-06-02 DIAGNOSIS — C7949 Secondary malignant neoplasm of other parts of nervous system: Secondary | ICD-10-CM

## 2015-06-02 DIAGNOSIS — J90 Pleural effusion, not elsewhere classified: Secondary | ICD-10-CM

## 2015-06-02 DIAGNOSIS — C7931 Secondary malignant neoplasm of brain: Secondary | ICD-10-CM

## 2015-06-02 LAB — CUP PACEART REMOTE DEVICE CHECK
Battery Impedance: 135 Ohm
Battery Remaining Longevity: 148 mo
Battery Voltage: 2.8 V
Brady Statistic AP VP Percent: 0 %
Brady Statistic AS VP Percent: 3 %
Implantable Lead Implant Date: 20140916
Implantable Lead Location: 753860
Implantable Lead Model: 5592
Lead Channel Impedance Value: 487 Ohm
Lead Channel Pacing Threshold Amplitude: 0.5 V
Lead Channel Pacing Threshold Pulse Width: 0.4 ms
Lead Channel Sensing Intrinsic Amplitude: 1.4 mV
Lead Channel Setting Pacing Amplitude: 2.5 V
MDC IDC LEAD IMPLANT DT: 20140916
MDC IDC LEAD LOCATION: 753859
MDC IDC MSMT LEADCHNL RV IMPEDANCE VALUE: 573 Ohm
MDC IDC MSMT LEADCHNL RV PACING THRESHOLD AMPLITUDE: 0.625 V
MDC IDC MSMT LEADCHNL RV PACING THRESHOLD PULSEWIDTH: 0.4 ms
MDC IDC MSMT LEADCHNL RV SENSING INTR AMPL: 8 mV
MDC IDC SESS DTM: 20161219125200
MDC IDC SET LEADCHNL RA PACING AMPLITUDE: 2 V
MDC IDC SET LEADCHNL RV PACING PULSEWIDTH: 0.4 ms
MDC IDC SET LEADCHNL RV SENSING SENSITIVITY: 4 mV
MDC IDC STAT BRADY AP VS PERCENT: 5 %
MDC IDC STAT BRADY AS VS PERCENT: 91 %

## 2015-06-02 LAB — COMPREHENSIVE METABOLIC PANEL
ALBUMIN: 3.2 g/dL — AB (ref 3.5–5.0)
ALK PHOS: 78 U/L (ref 40–150)
ALT: 24 U/L (ref 0–55)
AST: 12 U/L (ref 5–34)
Anion Gap: 8 mEq/L (ref 3–11)
BILIRUBIN TOTAL: 0.76 mg/dL (ref 0.20–1.20)
BUN: 23.4 mg/dL (ref 7.0–26.0)
CALCIUM: 9.2 mg/dL (ref 8.4–10.4)
CO2: 25 mEq/L (ref 22–29)
CREATININE: 0.8 mg/dL (ref 0.7–1.3)
Chloride: 106 mEq/L (ref 98–109)
EGFR: 86 mL/min/{1.73_m2} — ABNORMAL LOW (ref >90)
GLUCOSE: 106 mg/dL (ref 70–140)
POTASSIUM: 4.3 meq/L (ref 3.5–5.1)
Sodium: 138 mEq/L (ref 136–145)
TOTAL PROTEIN: 6 g/dL — AB (ref 6.4–8.3)

## 2015-06-02 LAB — CBC WITH DIFFERENTIAL/PLATELET
BASO%: 0.6 % (ref 0.0–2.0)
BASOS ABS: 0 10*3/uL (ref 0.0–0.1)
EOS ABS: 0.1 10*3/uL (ref 0.0–0.5)
EOS%: 1.6 % (ref 0.0–7.0)
HEMATOCRIT: 29.3 % — AB (ref 38.4–49.9)
HEMOGLOBIN: 9.1 g/dL — AB (ref 13.0–17.1)
LYMPH#: 0.5 10*3/uL — AB (ref 0.9–3.3)
LYMPH%: 9.8 % — ABNORMAL LOW (ref 14.0–49.0)
MCH: 28.7 pg (ref 27.2–33.4)
MCHC: 31.2 g/dL — ABNORMAL LOW (ref 32.0–36.0)
MCV: 91.9 fL (ref 79.3–98.0)
MONO#: 0.5 10*3/uL (ref 0.1–0.9)
MONO%: 10.2 % (ref 0.0–14.0)
NEUT%: 77.8 % — ABNORMAL HIGH (ref 39.0–75.0)
NEUTROS ABS: 3.8 10*3/uL (ref 1.5–6.5)
Platelets: 44 10*3/uL — ABNORMAL LOW (ref 140–400)
RBC: 3.19 10*6/uL — ABNORMAL LOW (ref 4.20–5.82)
RDW: 25.3 % — AB (ref 11.0–14.6)
WBC: 4.9 10*3/uL (ref 4.0–10.3)

## 2015-06-02 NOTE — Progress Notes (Signed)
Remote pacemaker transmission.   

## 2015-06-02 NOTE — Progress Notes (Signed)
[place] [phone]   [phone]  OFFICE PROGRESS NOTE  [name],[name] [name], MD [place] [address]  DIAGNOSIS: Non-small cell carcinoma of lung, stage 4  Primary site: Lung (Right)  Staging method: AJCC 7th Edition  Clinical: Stage IV (T1a, N3, M1b) signed by Curt Bears, MD on 02/01/2014 1:42 PM  Summary: Stage IV (T1a, N3, M1b) Prostate cancer  Primary site: Prostate  Clinical: Stage I (T1c, NX, M0) signed by Wyatt Portela, MD on 08/06/2013 1:59 PM   Summary: Stage I (T1c, NX, M0)  PRIOR THERAPY:  1) Systemic chemotherapy with carboplatin for an AUC of 5 and Alimta 500 mg per meter squared given every 3 weeks. Status post 6 cycles, last dose was given 05/20/2014 discontinued secondary to disease progression. 2) palliative radiotherapy to the right lung and lumbar spines under the care of Dr. Sondra Come. 3) status post craniotomy with tumor excision under the care of Dr. Vertell Limber on 12/04/2014.  CURRENT THERAPY:  1) Immunotherapy with Ketruda (pembrolizumab) 200 KG every 3 weeks. First dose 07/29/2014. He has positive PDL 1 expression (90%). Status post 13 cycles. 2) Xarelto 20 mg by mouth daily for deep venous thrombosis and pulmonary embolism.  INTERVAL HISTORY: Nicholas Schmitt 79 y.o. male returns to the clinic today for follow-up visit accompanied by his son. He is feeling fine today with no specific complaints except for fatigue and shortness breath with exertion. He was recently admitted to [facility] with increasing shortness of breath and repeat CT angiogram of the chest during his hospitalization showed acute pulmonary embolism within the left lower lobe and probably within the left upper lobe. The overall clot burden was minimal. There was also moderate right pleural effusion. The patient underwent right-sided ultrasound-guided thoracentesis with drainage of 630 ML off pleural fluid. The final  cytology of the pleural fluid showed reactive mesothelial cells with no evidence of malignancy. The patient was on treatment with Xarelto at that time and this was switched to Lovenox subcutaneously. He is tolerating his treatment with Lovenox fairly well. The patient is currently a resident of [facility] skilled nursing facility and undergoing physical therapy. He is feeling helical bit better today and and wants to resume his treatment with immunotherapy. He denied having any significant fever or chills, no nausea or vomiting. The patient denied having any significant chest pain, shortness of breath except with exertion, cough or hemoptysis.   MEDICAL HISTORY: Past Medical History  Diagnosis Date  . CAD (coronary artery disease)     positive stress test in 2006 led to left heart cath (8/06) showing 95% prox RCA, 90% CFX, and 90% mLAD.  patient had a cypher DES to all 3 lesions  . Hyperlipidemia   . Hearing loss   . AAA (abdominal aortic aneurysm) ([place]) 11/09    3.2 cm   . Chronic renal insufficiency   . History of radiation therapy 03/08/04- 04/08/04    prostate 4600 cGy in 3 fractions, radioactive seed implant 05/04/04  . Myocardial infarction ([place]) 1995  . Pacemaker 2014  . Shortness of breath     exertion  . COPD (chronic obstructive pulmonary disease) ([place])   . Radiation Jan.11-Jan 29/2016    Right central chest 35 gray in 14 fractions  . Radiation Jan.2016    35 gray in 14 fx lower lumbar/upper sacrum  . Diverticulosis   . Hemorrhoids   . Pneumonia     hx  .  GERD (gastroesophageal reflux disease)   . Hypertension     no med now for 3-4 months  . Prostate cancer ([name]) 2005    gleason 7  . Skin cancer     ear  . Lung cancer ([name])   . Cancer [facility]) 2015    prostate   . Cancer [facility]) December 04, 2014    Cranial   . Cancer [facility]) 04/01/2015    spine    ALLERGIES:  has No Known Allergies.  MEDICATIONS:  Current Outpatient Prescriptions  Medication Sig Dispense Refill  .  albuterol (PROVENTIL HFA;VENTOLIN HFA) 108 (90 BASE) MCG/ACT inhaler Inhale into the lungs every 6 (six) hours as needed for wheezing or shortness of breath.    . ALPRAZolam (XANAX) 0.25 MG tablet Take 1 tablet (0.25 mg total) by mouth 2 (two) times daily as needed for anxiety. 10 tablet 0  . benzonatate (TESSALON) 100 MG capsule Take 1 capsule (100 mg total) by mouth 3 (three) times daily as needed for cough. 20 capsule 0  . dexamethasone (DECADRON) 4 MG tablet Take 4 mg by mouth daily.    [name] docusate sodium (COLACE) 100 MG capsule Take 100 mg by mouth at bedtime as needed for mild constipation.    . enoxaparin (LOVENOX) 80 MG/0.8ML injection Inject 0.7 mLs (70 mg total) into the skin every 12 (twelve) hours. 60 Syringe 3  . famotidine (PEPCID) 20 MG tablet Take 20 mg by mouth at bedtime.    . ferrous sulfate 325 (65 FE) MG tablet Take 1 tablet (325 mg total) by mouth daily with breakfast. 60 tablet 0  . fluticasone (FLONASE) 50 MCG/ACT nasal spray USE 2 SPRAYS IN EACH NOSTRIL AT NIGHT  5  . folic acid (FOLVITE) 1 MG tablet Take 1 tablet (1 mg total) by mouth daily. 30 tablet 0  . gabapentin (NEURONTIN) 300 MG capsule Take 300 mg by mouth 3 (three) times daily.    [name] guaiFENesin (MUCINEX) 600 MG 12 hr tablet Take 1 tablet (600 mg total) by mouth 2 (two) times daily. 14 tablet 0  . lactulose (CHRONULAC) 10 GM/15ML solution Take 15 mLs by mouth 2 (two) times daily as needed. constipaton  1  . levofloxacin (LEVAQUIN) 750 MG tablet Take 1 tablet (750 mg total) by mouth daily. 6 tablet 0  . levothyroxine (SYNTHROID) 50 MCG tablet Take 1 tablet (50 mcg total) by mouth daily before breakfast. 30 tablet 0  . mirtazapine (REMERON) 30 MG tablet Take 1 tablet (30 mg total) by mouth at bedtime. 30 tablet 2  . Multiple Vitamins-Minerals (THERA-M) TABS Take 1 tablet by mouth daily.    . nitroGLYCERIN (NITROSTAT) 0.4 MG SL tablet Place 0.4 mg under the tongue every 5 (five) minutes as needed for chest pain (MAX 3  TABLETS).    [name] oxyCODONE (OXY IR/ROXICODONE) 5 MG immediate release tablet Take 1 tablet (5 mg total) by mouth every 6 (six) hours as needed for severe pain. 10 tablet 0  . prochlorperazine (COMPAZINE) 10 MG tablet Take 10 mg by mouth every 6 (six) hours as needed for nausea or vomiting.    . senna (SENOKOT) 8.6 MG tablet Take 1 tablet by mouth at bedtime as needed for constipation.    . tamsulosin (FLOMAX) 0.4 MG CAPS capsule Take 0.4 mg by mouth at bedtime.   2  . budesonide-formoterol (SYMBICORT) 160-4.5 MCG/ACT inhaler Inhale 2 puffs into the lungs 2 (two) times daily.     No current facility-administered medications for this visit.  SURGICAL HISTORY:  Past Surgical History  Procedure Laterality Date  . Ptca  2006  . Coronary stent placement    . Permanent pacemaker insertion Bilateral 02/26/13    MDT Adapta L implanted by Dr Rayann Heman for sick sinus syndrome  . Radioactive seed implant  05/04/2004    8000 cGy, Dr Danny Lawless Dr Reece Agar  . Elbow surgery Bilateral 1973  . Coronary angioplasty    . Mediastinoscopy N/A 01/28/2014    Procedure: MEDIASTINOSCOPY;  Surgeon: Melrose Nakayama, MD;  Location: [facility];  Service: Thoracic;  Laterality: N/A;  . Permanent pacemaker insertion N/A 02/26/2013    Procedure: PERMANENT PACEMAKER INSERTION;  Surgeon: [name], MD;  Location: [place] CATH LAB;  Service: Cardiovascular;  Laterality: N/A;  . Insert / replace / remove pacemaker      not removed  . Craniotomy N/A 12/04/2014    Procedure: CRANIOTOMY TUMOR EXCISION, LEFT;  Surgeon: [name], MD;  Location: [place] NEURO ORS;  Service: Neurosurgery;  Laterality: N/A;  CRANIOTOMY TUMOR EXCISION  . Application of cranial navigation  12/04/2014    Procedure: APPLICATION OF CRANIAL NAVIGATION;  Surgeon: [name], MD;  Location: [place] ORS;  Service: Neurosurgery;;  . D[place] Dates without cardioversion N/A 05/06/2015    Procedure: TRANSESOPHAGEAL ECHOCARDIOGRAM (TEE);  Surgeon: [name], MD;  Location:  [place];  Service: Cardiovascular;  Laterality: N/A;    REVIEW OF SYSTEMS:  Constitutional: positive for fatigue Eyes: negative Ears, nose, mouth, throat, and face: negative Respiratory: positive for cough and dyspnea on exertion Cardiovascular: negative Gastrointestinal: negative Genitourinary:negative Integument/breast: negative Hematologic/lymphatic: negative Musculoskeletal:positive for muscle weakness Neurological: negative Behavioral/Psych: negative Endocrine: negative Allergic/Immunologic: negative   PHYSICAL EXAMINATION: General appearance: alert, cooperative, fatigued and no distress Head: Normocephalic, without obvious abnormality, atraumatic Neck: no adenopathy, no carotid bruit, supple, symmetrical, trachea midline and thyroid not enlarged, symmetric, no tenderness/mass/nodules Lymph nodes: Cervical, supraclavicular, and axillary nodes normal. Resp: clear to auscultation bilaterally Back: symmetric, no curvature. ROM normal. No CVA tenderness. Cardio: regular rate and rhythm, S1, S2 normal, no murmur, click, rub or gallop GI: soft, non-tender; bowel sounds normal; no masses,  no organomegaly Extremities: extremities normal, atraumatic, no cyanosis or edema Neurologic: Alert and oriented X 3, normal strength and tone. Normal symmetric reflexes. Normal coordination and gait  ECOG PERFORMANCE STATUS: 2 - Symptomatic, <50% confined to bed  Blood pressure 114/61, pulse 102, temperature 97.6 F (36.4 C), temperature source Oral, resp. rate 17, height 6' (1.829 m), weight 176 lb 11.2 oz (80.151 kg), SpO2 97 %.  LABORATORY DATA: Lab Results  Component Value Date   WBC 4.9 06/02/2015   HGB 9.1* 06/02/2015   HCT 29.3* 06/02/2015   MCV 91.9 06/02/2015   PLT 44* 06/02/2015      Chemistry      Component Value Date/Time   NA 142 05/26/2015 0235   NA 139 05/12/2015 0913   K 4.3 05/26/2015 0235   K 3.7 05/12/2015 0913   CL 106 05/26/2015 0235   CO2 29 05/26/2015  0235   CO2 27 05/12/2015 0913   BUN 19 05/26/2015 0235   BUN 14.9 05/12/2015 0913   CREATININE 0.66 05/26/2015 0235   CREATININE 0.7 05/12/2015 0913      Component Value Date/Time   CALCIUM 8.9 05/26/2015 0235   CALCIUM 9.2 05/12/2015 0913   ALKPHOS 76 05/25/2015 0956   ALKPHOS 69 05/12/2015 0913   AST 17 05/25/2015 0956   AST 14 05/12/2015 0913   ALT 25 05/25/2015 0956  ALT 15 05/12/2015 0913   BILITOT 1.6* 05/25/2015 0956   BILITOT 0.69 05/12/2015 0913       RADIOGRAPHIC STUDIES: Dg Chest 1 View  05/25/2015  CLINICAL DATA:  Post right-sided thoracentesis. EXAM: CHEST 1 VIEW COMPARISON:  05/25/2015; 05/21/2015; chest CT - 05/21/2015 FINDINGS: Interval reduction / near resolution of right-sided pleural effusion post thoracentesis. No pneumothorax. Grossly unchanged cardiac silhouette and mediastinal contours. Right perihilar heterogeneous opacities are unchanged with persistent moderate elevation of the right hemidiaphragm. Well-defined retrocardiac opacity compatible with known Bochdalek hernia. No evidence of edema. Stable position of support apparatus. No acute osseus abnormalities. IMPRESSION: 1. Interval reduction / near resolution of right-sided effusion post thoracentesis. No pneumothorax. 2. Stable sequela of perihilar radiation change in associated volume loss. 3. Unchanged well-defined retrocardiac opacity compatible with known Bochdalek hernia. Electronically Signed   By: Sandi Mariscal M.D.   On: 05/25/2015 16:16   Dg Chest 1 View  05/05/2015  CLINICAL DATA:  Post right thoracentesis. EXAM: CHEST 1 VIEW COMPARISON:  05/05/2015 FINDINGS: Persistent right pleural effusion with probable consolidation in the right lung base causing tenting of the hemidiaphragm. Mild improvement of aeration in the right lung. Prominence of the right hilum with volume loss in the right upper lung suggests obstructing mass lesion. No pneumothorax. Left lung appears clear and expanded with mild  hyperinflation. Heart size and pulmonary vascularity are normal for technique. Calcification of the aorta. Cardiac pacemaker. Degenerative changes in the spine and shoulders. IMPRESSION: Persistent right pleural effusion with mild improvement of aeration in the right lung post thoracentesis. No pneumothorax. The volume loss in the right upper lung with right hilar prominence suggesting obstructing mass lesion. Electronically Signed   By: Lucienne Capers M.D.   On: 05/05/2015 18:29   Dg Chest 2 View  05/25/2015  CLINICAL DATA:  Recurrent pulmonary embolism. History of coronary artery disease, COPD and non-small-cell lung cancer. Weakness and shortness of breath today. EXAM: CHEST  2 VIEW COMPARISON:  Radiographs and CT 05/21/2015. FINDINGS: There are lower lung volumes with enlargement of the subpulmonic right pleural effusion and increased patchy airspace opacities at both lung bases. Underlying right perihilar density corresponds with radiation fibrosis on CT. The heart size and mediastinal contours are stable. Left subclavian AICD leads appear unchanged. IMPRESSION: Worsening aeration of the lung bases with enlarging right pleural effusion and patchy bibasilar airspace opacities. Electronically Signed   By: Richardean Sale M.D.   On: 05/25/2015 10:36   Dg Chest 2 View  05/21/2015  CLINICAL DATA:  Shortness of breath with exertion EXAM: CHEST  2 VIEW COMPARISON:  05/05/2015 FINDINGS: Normal heart size. Dual-chamber pacer leads from the left are in unremarkable position. There is right perihilar fibrosis based on staging chest CT 03/27/2015. There is associated volume loss in the right upper lobe. Chronic elevation the right diaphragm with pleural effusion that is stable from 05/05/2015. Mild atelectasis or scarring at the left base. IMPRESSION: 1. No change from 05/05/2015 to suggest acute disease. 2. Right perihilar fibrosis and upper lobe atelectasis from lung cancer radiation. 3. Small right pleural  effusion. Electronically Signed   By: Monte Fantasia M.D.   On: 05/21/2015 11:18   Dg Chest 2 View  05/05/2015  CLINICAL DATA:  Acute onset of worsening shortness of breath and generalized weakness. Initial encounter. EXAM: CHEST  2 VIEW COMPARISON:  Chest radiograph performed 07/23/2014, and CT of the chest performed 03/27/2015 FINDINGS: A relatively large right-sided pleural effusion is noted, with underlying airspace opacification. Mild  vascular congestion is noted. The left lung is grossly clear. No pneumothorax is seen. The cardiomediastinal silhouette is borderline normal in size. A pacemaker is noted at the left chest wall, with leads ending at the right atrium and right ventricle. No acute osseous abnormalities are seen. IMPRESSION: Increasing relatively large right-sided pleural effusion, with underlying airspace opacification. Mild vascular congestion noted. This is concerning for a malignant effusion, given the patient's known lung cancer. Underlying pneumonia cannot be entirely excluded, depending on the patient's symptoms. Electronically Signed   By: Garald Balding M.D.   On: 05/05/2015 06:36   Ct Head W Wo Contrast  05/22/2015  CLINICAL DATA:  79 year old male with lung cancer metastatic to the brain status post tumor resection and stereotactic radiosurgery in June. Subsequent encounter. EXAM: CT HEAD WITHOUT AND WITH CONTRAST TECHNIQUE: Contiguous axial images were obtained from the base of the skull through the vertex without and with intravenous contrast CONTRAST:  15m OMNIPAQUE IOHEXOL 300 MG/ML  SOLN COMPARISON:  03/13/2015. FINDINGS: Sequelae of posterior left craniotomy is stable. No acute or suspicious osseous lesion identified. Chronic dural calcifications. Visualized paranasal sinuses and mastoids are clear. No acute orbit or scalp soft tissue findings. Stable CT appearance of the left posterior hemisphere resection cavity with no regional mass effect. Ex vacuo enlargement of the  left atrium. Following contrast, no abnormal enhancement identified. Stable and normal gray-white matter differentiation elsewhere. No intracranial mass effect. No new intracranial enhancement. No acute intracranial hemorrhage identified. No cortically based acute infarct identified. Major intracranial vascular structures are enhancing. Calcified atherosclerosis at the skull base. IMPRESSION: 1. Stable and satisfactory post treatment CT appearance of the brain. 2.  No acute or metastatic intracranial abnormality. Electronically Signed   By: HGenevie AnnM.D.   On: 05/22/2015 20:19   Ct Angio Chest Pe W/cm &/or Wo Cm  05/21/2015  CLINICAL DATA:  Short of breath exertion. COPD. Concern pulmonary embolism. Personal history of abdominal aortic aneurysm and lung cancer cells prostate cancer. EXAM: CT ANGIOGRAPHY CHEST WITH CONTRAST TECHNIQUE: Multidetector CT imaging of the chest was performed using the standard protocol during bolus administration of intravenous contrast. Multiplanar CT image reconstructions and MIPs were obtained to evaluate the vascular anatomy. CONTRAST:  1027mOMNIPAQUE IOHEXOL 350 MG/ML SOLN COMPARISON:  CT thorax 05/05/2015 FINDINGS: Mediastinum/Nodes: There is extensive venous collaterals within the mediastinum related to occlusion of the superior vena cava. This occlusion is chronic and similar to CT of 05/05/2015. The extensive collateralization does limit timing and bolus concentration for evaluation of pulmonary embolism. Small focal filling defect within the LEFT lower lobe pulmonary artery (image 164 through 175 of series 10)extends a short distance into segmental pulmonary arteries. Potential small filling defect within a LEFT upper lobe pulmonary artery on image 95, series 10. The RIGHT pulmonary artery is constricted at the hilum (image 125, series 10). This is a chronic finding but no clear filling defects within the RIGHT pulmonary arteries. The RIGHT ventricular to LEFT ventricular  ratio is less than 0.9 (no evidence of RIGHT ventricular strain). Lungs/Pleura: There is perihilar consolidation and bronchiectasis on the RIGHT consists with radiation change. Moderate RIGHT effusion. No new nodularity. LEFT lung parenchyma is clear. No pulmonary infarction noted. Upper abdomen: Thickening of the LEFT adrenal gland 2.6 by 1.9 cm (image 22, series 10) this compares to 2.7 by 1.8 cm on most recent CT scan for no significant change. There is thickening of the esophagus above the carina (image 75, series 5 Musculoskeletal: No aggressive osseous  lesion. Review of the MIP images confirms the above findings. IMPRESSION: 1. Acute pulmonary embolism within the LEFT lower lobe and probably within the LEFT upper lobe. The overall clot burden is a minimal. No CT evidence of RIGHT ventricular strain. 2. Chronic constriction of the RIGHT main pulmonary artery by a perihilar radiation change. 3. Chronic perihilar consolidation and bronchiectasis in the RIGHT hilum unchanged from prior consistent radiation change. 4. Moderate RIGHT effusion is improved compared to prior. 5. 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 6. Thickening esophagus above the carina and likely relates radiation change. 7. Stable thickening of the RIGHT adrenal gland. Critical results were conveyed to Dr. Theotis Burrow, on 05/21/2015 at 14 10 hours Electronically Signed   By: Suzy Bouchard M.D.   On: 05/21/2015 14:10   Ct Angio Chest Pe W/cm &/or Wo Cm  05/05/2015  CLINICAL DATA:  Shortness of breath. Pleural effusion. History pulmonary embolism. EXAM: CT ANGIOGRAPHY CHEST WITH CONTRAST TECHNIQUE: Multidetector CT imaging of the chest was performed using the standard protocol during bolus administration of intravenous contrast. Multiplanar CT image reconstructions and MIPs were obtained to evaluate the vascular anatomy. CONTRAST:  148m OMNIPAQUE IOHEXOL 350 MG/ML SOLN COMPARISON:  03/27/2015 FINDINGS: THORACIC INLET/BODY WALL:  Body wall edema and extensive venous collaterals, both intra and extra thoracic, secondary to chronic SVC obstruction. No axillary or supraclavicular adenopathy detected. MEDIASTINUM: No cardiomegaly or pericardial effusion. Dual-chamber pacer from the right. Suboptimal opacification of the pulmonary arterial tree due to SVC obstruction with bolus dispersion and intermittent motion. Right-sided pulmonary arteries are distorted and attenuated without central acute filling defect. Previous pulmonary embolism to the right lower lobe, with peripheral thick walled appearance compatible with endothelialized clot. No convincing pulmonary embolism, limited in subsegmental arteries. No acute aortic finding. No progressive mediastinal adenopathy. LUNG WINDOWS: Extensive atelectasis and scarring around the right hilum with bronchial distortion. There is a progressive and large layering right pleural effusion without pleural nodularity or thickening. No superimposed consolidation or edema. UPPER ABDOMEN: Stable bilateral adrenal nodularity, presumed metastases. No acute finding. OSSEOUS: No acute finding. Review of the MIP images confirms the above findings. IMPRESSION: 1. No convincing acute pulmonary embolism. 2. Partial re- cannulization of recent right lower lobe pulmonary embolism. 3. Chronic SVC obstruction which limits CTA. 4. Large and progressive layering right pleural effusion. Electronically Signed   By: JMonte FantasiaM.D.   On: 05/05/2015 08:42   Dg Abd 2 Views  05/05/2015  CLINICAL DATA:  Acute onset of mid abdominal pain and constipation. Initial encounter. EXAM: ABDOMEN - 2 VIEW COMPARISON:  CT of the abdomen and pelvis performed 03/27/2015 FINDINGS: The visualized bowel gas pattern is unremarkable. Scattered air and stool filled loops of colon are seen; no abnormal dilatation of small bowel loops is seen to suggest small bowel obstruction. No free intra-abdominal air is identified, though evaluation for  free air is limited on a single supine view. The visualized osseous structures are within normal limits; the sacroiliac joints are unremarkable in appearance. A large right-sided pleural effusion is partially imaged. Pacemaker leads are partially seen. An IVC filter is noted. Brachytherapy seeds are seen overlying the prostate bed. IMPRESSION: Unremarkable bowel gas pattern; no free intra-abdominal air seen. Small to moderate amount of stool noted in the colon. Electronically Signed   By: JGarald BaldingM.D.   On: 05/05/2015 06:38   UKoreaThoracentesis Asp Pleural Space W/img Guide  05/25/2015  INDICATION: Patient with stage IV lung cancer, prostate cancer, recurrent PE, dyspnea,  recurrent right pleural effusion. Request is made for diagnostic and therapeutic right thoracentesis. EXAM: ULTRASOUND GUIDED DIAGNOSTIC AND THERAPEUTIC RIGHT THORACENTESIS COMPARISON:  Prior thoracentesis on 05/05/2015 MEDICATIONS: None COMPLICATIONS: None immediate TECHNIQUE: Informed written consent was obtained from the patient after a discussion of the risks, benefits and alternatives to treatment. A timeout was performed prior to the initiation of the procedure. Initial ultrasound scanning demonstrates a small to moderate right pleural effusion. The lower chest was prepped and draped in the usual sterile fashion. 1% lidocaine was used for local anesthesia. An ultrasound image was saved for documentation purposes. A 6 Fr Safe-T-Centesis catheter was introduced. The thoracentesis was performed. The catheter was removed and a dressing was applied. The patient tolerated the procedure well without immediate post procedural complication. The patient was escorted to have an upright chest radiograph. FINDINGS: A total of approximately 630 cc of hazy, blood-tinged fluid was removed. Requested samples were sent to the laboratory. IMPRESSION: Successful ultrasound-guided diagnostic and therapeutic right sided thoracentesis yielding 630 cc of  pleural fluid. Read by: Rowe Robert, PA-C Electronically Signed   By: Sandi Mariscal M.D.   On: 05/25/2015 16:01   US Thoracentesis Asp Pleural Space W/img Guide  05/05/2015  CLINICAL DATA:  79 year old male with a history of new right-sided pleural effusion. He has been referred for diagnostic and therapeutic thoracentesis. EXAM: ULTRASOUND GUIDED right THORACENTESIS COMPARISON:  CT 05/05/2015 PROCEDURE: An ultrasound guided thoracentesis was thoroughly discussed with the patient and questions answered. The benefits, risks, alternatives and complications were also discussed. The patient understands and wishes to proceed with the procedure. Written consent was obtained. Ultrasound was performed to localize and mark an adequate pocket of fluid in the right chest. The area was then prepped and draped in the normal sterile fashion. 1% Lidocaine was used for local anesthesia. Under ultrasound guidance a Safe-T-Centesis catheter was introduced. Thoracentesis was performed. The catheter was removed and a dressing applied. Patient tolerated the procedure well and remained hemodynamically stable throughout. No complications encountered. COMPLICATIONS: None FINDINGS: A total of approximately 650 cc of serosanguineous fluid was removed. A fluid sample wassent for laboratory analysis. IMPRESSION: Status post right-sided thoracentesis.  650 cc of fluid removed. Sample was sent to the lab. Signed, Dulcy Fanny. Earleen Newport, DO [facility] Electronically Signed   By: Corrie Mckusick D.O.   On: 05/05/2015 18:26    ASSESSMENT AND PLAN: this is a very pleasant 79 years old white male with stage IV non-small cell lung cancer, adenocarcinoma with positive PDL 1 expression, completed systemic chemotherapy with carboplatin and Alimta status post 6 cycles and tolerated his treatment fairly well.  He is currently on treatment with immunotherapy with Ketruda (pembrolizumab) status post  13 cycles.  He was recently diagnosed with recurrent acute pulmonary embolism and the patient continues to have thrombocytopenia. He also has fatigue and weakness. I recommended for the patient to delay the start of cycle #14 by a few weeks until recovery of his condition. I also discussed with the patient consideration of palliative care but he is still interested in proceeding with treatment with immunotherapy. I will arrange for him to come back for follow-up visit in 3 weeks for reevaluation before starting cycle #14. 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 For the recent diagnosis of acute pulmonary embolism, the patient will continue on Lovenox as previously ordered. For history of COPD, I recommended for the patient to continue treatment with Decadron but  we will reduce the dose to 4 mg by mouth daily. He was advised to call if he has any concerning symptoms in the interval. The patient was advised to call immediately if he has any concerning symptoms in the interval. The patient voices understanding of current disease status and treatment options and is in agreement with the current care plan.  All questions were answered. The patient knows to call the clinic with any problems, questions or concerns. We can certainly see the patient much sooner if necessary.  Disclaimer: This note was dictated with voice recognition software. Similar sounding words can inadvertently be transcribed and may not be corrected upon review.

## 2015-06-03 ENCOUNTER — Ambulatory Visit: Payer: Medicare Other | Admitting: Physical Therapy

## 2015-06-04 ENCOUNTER — Encounter: Payer: Self-pay | Admitting: Cardiology

## 2015-06-06 ENCOUNTER — Other Ambulatory Visit: Payer: Self-pay | Admitting: Radiation Oncology

## 2015-06-18 ENCOUNTER — Ambulatory Visit: Payer: Medicare Other

## 2015-06-19 ENCOUNTER — Ambulatory Visit (HOSPITAL_COMMUNITY): Payer: Medicare Other

## 2015-06-22 ENCOUNTER — Ambulatory Visit: Payer: Self-pay | Admitting: Radiation Oncology

## 2015-06-23 ENCOUNTER — Ambulatory Visit: Payer: PPO

## 2015-06-23 ENCOUNTER — Encounter: Payer: Self-pay | Admitting: Internal Medicine

## 2015-06-23 ENCOUNTER — Other Ambulatory Visit (HOSPITAL_BASED_OUTPATIENT_CLINIC_OR_DEPARTMENT_OTHER): Payer: PPO

## 2015-06-23 ENCOUNTER — Encounter: Payer: PPO | Admitting: Nutrition

## 2015-06-23 ENCOUNTER — Ambulatory Visit (HOSPITAL_BASED_OUTPATIENT_CLINIC_OR_DEPARTMENT_OTHER): Payer: PPO | Admitting: Internal Medicine

## 2015-06-23 ENCOUNTER — Telehealth: Payer: Self-pay | Admitting: *Deleted

## 2015-06-23 ENCOUNTER — Telehealth: Payer: Self-pay | Admitting: Internal Medicine

## 2015-06-23 VITALS — BP 113/70 | HR 85 | Temp 97.7°F | Resp 17 | Ht 72.0 in | Wt 184.8 lb

## 2015-06-23 DIAGNOSIS — C7931 Secondary malignant neoplasm of brain: Secondary | ICD-10-CM | POA: Diagnosis not present

## 2015-06-23 DIAGNOSIS — C3490 Malignant neoplasm of unspecified part of unspecified bronchus or lung: Secondary | ICD-10-CM

## 2015-06-23 DIAGNOSIS — C3491 Malignant neoplasm of unspecified part of right bronchus or lung: Secondary | ICD-10-CM | POA: Diagnosis not present

## 2015-06-23 DIAGNOSIS — C7951 Secondary malignant neoplasm of bone: Secondary | ICD-10-CM | POA: Diagnosis not present

## 2015-06-23 DIAGNOSIS — Z79899 Other long term (current) drug therapy: Secondary | ICD-10-CM

## 2015-06-23 DIAGNOSIS — D696 Thrombocytopenia, unspecified: Secondary | ICD-10-CM

## 2015-06-23 DIAGNOSIS — Z86718 Personal history of other venous thrombosis and embolism: Secondary | ICD-10-CM

## 2015-06-23 DIAGNOSIS — Z86711 Personal history of pulmonary embolism: Secondary | ICD-10-CM

## 2015-06-23 LAB — CBC WITH DIFFERENTIAL/PLATELET
BASO%: 0.2 % (ref 0.0–2.0)
BASOS ABS: 0 10*3/uL (ref 0.0–0.1)
EOS ABS: 0 10*3/uL (ref 0.0–0.5)
EOS%: 0.8 % (ref 0.0–7.0)
HEMATOCRIT: 30 % — AB (ref 38.4–49.9)
HEMOGLOBIN: 9.2 g/dL — AB (ref 13.0–17.1)
LYMPH#: 0.6 10*3/uL — AB (ref 0.9–3.3)
LYMPH%: 11.5 % — ABNORMAL LOW (ref 14.0–49.0)
MCH: 29.7 pg (ref 27.2–33.4)
MCHC: 30.7 g/dL — ABNORMAL LOW (ref 32.0–36.0)
MCV: 96.8 fL (ref 79.3–98.0)
MONO#: 0.6 10*3/uL (ref 0.1–0.9)
MONO%: 10.8 % (ref 0.0–14.0)
NEUT#: 4.1 10*3/uL (ref 1.5–6.5)
NEUT%: 76.7 % — AB (ref 39.0–75.0)
PLATELETS: 34 10*3/uL — AB (ref 140–400)
RBC: 3.1 10*6/uL — ABNORMAL LOW (ref 4.20–5.82)
RDW: 21.7 % — AB (ref 11.0–14.6)
WBC: 5.3 10*3/uL (ref 4.0–10.3)

## 2015-06-23 LAB — COMPREHENSIVE METABOLIC PANEL
ALBUMIN: 3.3 g/dL — AB (ref 3.5–5.0)
ALK PHOS: 76 U/L (ref 40–150)
ALT: 33 U/L (ref 0–55)
AST: 15 U/L (ref 5–34)
Anion Gap: 9 mEq/L (ref 3–11)
BILIRUBIN TOTAL: 0.94 mg/dL (ref 0.20–1.20)
BUN: 25 mg/dL (ref 7.0–26.0)
CALCIUM: 9.5 mg/dL (ref 8.4–10.4)
CO2: 28 mEq/L (ref 22–29)
CREATININE: 0.9 mg/dL (ref 0.7–1.3)
Chloride: 106 mEq/L (ref 98–109)
EGFR: 84 mL/min/{1.73_m2} — ABNORMAL LOW (ref >90)
Glucose: 85 mg/dl (ref 70–140)
POTASSIUM: 4.3 meq/L (ref 3.5–5.1)
Sodium: 142 mEq/L (ref 136–145)
TOTAL PROTEIN: 6.3 g/dL — AB (ref 6.4–8.3)

## 2015-06-23 LAB — TSH: TSH: 3.303 m(IU)/L (ref 0.320–4.118)

## 2015-06-23 LAB — TECHNOLOGIST REVIEW

## 2015-06-23 NOTE — Telephone Encounter (Signed)
Per staff message and POF I have scheduled appts. Advised scheduler of appts and that MD visit on 2/22 is to late . JMW

## 2015-06-23 NOTE — Progress Notes (Signed)
[facility] [phone]   [phone]  OFFICE PROGRESS NOTE  [name],[name] [name], MD [place] [address]  DIAGNOSIS: Non-small cell carcinoma of lung, stage 4  Primary site: Lung (Right)  Staging method: AJCC 7th Edition  Clinical: Stage IV (T1a, N3, M1b) signed by Curt Bears, MD on 02/01/2014 1:42 PM  Summary: Stage IV (T1a, N3, M1b) Prostate cancer  Primary site: Prostate  Clinical: Stage I (T1c, NX, M0) signed by Wyatt Portela, MD on 08/06/2013 1:59 PM   Summary: Stage I (T1c, NX, M0)  PRIOR THERAPY:  1) Systemic chemotherapy with carboplatin for an AUC of 5 and Alimta 500 mg per meter squared given every 3 weeks. Status post 6 cycles, last dose was given 05/20/2014 discontinued secondary to disease progression. 2) palliative radiotherapy to the right lung and lumbar spines under the care of Dr. Sondra Come. 3) status post craniotomy with tumor excision under the care of Dr. Vertell Limber on 12/04/2014.  CURRENT THERAPY:  1) Immunotherapy with Ketruda (pembrolizumab) 200 KG every 3 weeks. First dose 07/29/2014. He has positive PDL 1 expression (90%). Status post 13 cycles. 2) Xarelto 20 mg by mouth daily for deep venous thrombosis and pulmonary embolism.  INTERVAL HISTORY: Nicholas Schmitt 80 y.o. male returns to the clinic today for follow-up visit accompanied by his girlfriend. He is feeling fine today with no specific complaints except for fatigue and shortness breath with exertion. The patient is currently on Lovenox 80 mg subcutaneously twice a day and it's causing him a lot of bruises and ecchymosis in the abdomen area. He would like to change it to once a day. The patient is currently a resident of [facility] skilled nursing facility and undergoing physical therapy. He is feeling helical bit better today and and wants to resume his treatment with immunotherapy. He denied having any significant fever or chills, no  nausea or vomiting. The patient denied having any significant chest pain, shortness of breath except with exertion, cough or hemoptysis.   MEDICAL HISTORY: Past Medical History  Diagnosis Date  . CAD (coronary artery disease)     positive stress test in 2006 led to left heart cath (8/06) showing 95% prox RCA, 90% CFX, and 90% mLAD.  patient had a cypher DES to all 3 lesions  . Hyperlipidemia   . Hearing loss   . AAA (abdominal aortic aneurysm) ([place]) 11/09    3.2 cm   . Chronic renal insufficiency   . History of radiation therapy 03/08/04- 04/08/04    prostate 4600 cGy in 3 fractions, radioactive seed implant 05/04/04  . Myocardial infarction ([place]) 1995  . Pacemaker 2014  . Shortness of breath     exertion  . COPD (chronic obstructive pulmonary disease) ([place])   . Radiation Jan.11-Jan 29/2016    Right central chest 35 gray in 14 fractions  . Radiation Jan.2016    35 gray in 14 fx lower lumbar/upper sacrum  . Diverticulosis   . Hemorrhoids   . Pneumonia     hx  . GERD (gastroesophageal reflux disease)   . Hypertension     no med now for 3-4 months  . Prostate cancer ([place]) 2005    gleason 7  . Skin cancer     ear  . Lung cancer ([place])   . Cancer [facility]) 2015    prostate   . Cancer [facility]) December 04, 2014    Cranial   . Cancer [facility]) 04/01/2015  spine    ALLERGIES:  has No Known Allergies.  MEDICATIONS:  Current Outpatient Prescriptions  Medication Sig Dispense Refill  . albuterol (PROVENTIL HFA;VENTOLIN HFA) 108 (90 BASE) MCG/ACT inhaler Inhale into the lungs every 6 (six) hours as needed for wheezing or shortness of breath.    . ALPRAZolam (XANAX) 0.25 MG tablet Take 1 tablet (0.25 mg total) by mouth 2 (two) times daily as needed for anxiety. 10 tablet 0  . benzonatate (TESSALON) 100 MG capsule Take 1 capsule (100 mg total) by mouth 3 (three) times daily as needed for cough. 20 capsule 0  . budesonide-formoterol (SYMBICORT) 160-4.5 MCG/ACT inhaler Inhale 2 puffs into the  lungs 2 (two) times daily.    [name] dexamethasone (DECADRON) 4 MG tablet Take 4 mg by mouth daily.    [name] docusate sodium (COLACE) 100 MG capsule Take 100 mg by mouth at bedtime as needed for mild constipation.    . enoxaparin (LOVENOX) 80 MG/0.8ML injection Inject 0.7 mLs (70 mg total) into the skin every 12 (twelve) hours. 60 Syringe 3  . famotidine (PEPCID) 20 MG tablet Take 20 mg by mouth at bedtime.    . ferrous sulfate 325 (65 FE) MG tablet Take 1 tablet (325 mg total) by mouth daily with breakfast. 60 tablet 0  . fluticasone (FLONASE) 50 MCG/ACT nasal spray USE 2 SPRAYS IN EACH NOSTRIL AT NIGHT  5  . folic acid (FOLVITE) 1 MG tablet Take 1 tablet (1 mg total) by mouth daily. 30 tablet 0  . gabapentin (NEURONTIN) 100 MG capsule TAKE 1 CAPS 3 TIMES DAY X5DAY, 2 CAPS 3 TIMES DAY X5DAYS, 3 CAPS 3 TIMES DAY TIL DIRECTED OTHERWISE 120 capsule 0  . gabapentin (NEURONTIN) 300 MG capsule Take 300 mg by mouth 3 (three) times daily.    [name] guaiFENesin (MUCINEX) 600 MG 12 hr tablet Take 1 tablet (600 mg total) by mouth 2 (two) times daily. 14 tablet 0  . lactulose (CHRONULAC) 10 GM/15ML solution Take 15 mLs by mouth 2 (two) times daily as needed. constipaton  1  . levofloxacin (LEVAQUIN) 750 MG tablet Take 1 tablet (750 mg total) by mouth daily. 6 tablet 0  . levothyroxine (SYNTHROID) 50 MCG tablet Take 1 tablet (50 mcg total) by mouth daily before breakfast. 30 tablet 0  . mirtazapine (REMERON) 30 MG tablet Take 1 tablet (30 mg total) by mouth at bedtime. 30 tablet 2  . Multiple Vitamins-Minerals (THERA-M) TABS Take 1 tablet by mouth daily.    . nitroGLYCERIN (NITROSTAT) 0.4 MG SL tablet Place 0.4 mg under the tongue every 5 (five) minutes as needed for chest pain (MAX 3 TABLETS).    [name] oxyCODONE (OXY IR/ROXICODONE) 5 MG immediate release tablet Take 1 tablet (5 mg total) by mouth every 6 (six) hours as needed for severe pain. 10 tablet 0  . prochlorperazine (COMPAZINE) 10 MG tablet Take 10 mg by mouth every 6  (six) hours as needed for nausea or vomiting.    . senna (SENOKOT) 8.6 MG tablet Take 1 tablet by mouth at bedtime as needed for constipation.    . tamsulosin (FLOMAX) 0.4 MG CAPS capsule Take 0.4 mg by mouth at bedtime.   2   No current facility-administered medications for this visit.    SURGICAL HISTORY:  Past Surgical History  Procedure Laterality Date  . Ptca  2006  . Coronary stent placement    . Permanent pacemaker insertion Bilateral 02/26/13    MDT Adapta L implanted by Dr Rayann Heman for  sick sinus syndrome  . Radioactive seed implant  05/04/2004    8000 cGy, Dr Danny Lawless Dr Reece Agar  . Elbow surgery Bilateral 1973  . Coronary angioplasty    . Mediastinoscopy N/A 01/28/2014    Procedure: MEDIASTINOSCOPY;  Surgeon: Melrose Nakayama, MD;  Location: [facility];  Service: Thoracic;  Laterality: N/A;  . Permanent pacemaker insertion N/A 02/26/2013    Procedure: PERMANENT PACEMAKER INSERTION;  Surgeon: [name], MD;  Location: [facility] CATH LAB;  Service: Cardiovascular;  Laterality: N/A;  . Insert / replace / remove pacemaker      not removed  . Craniotomy N/A 12/04/2014    Procedure: CRANIOTOMY TUMOR EXCISION, LEFT;  Surgeon: [name], MD;  Location: [facility] NEURO ORS;  Service: Neurosurgery;  Laterality: N/A;  CRANIOTOMY TUMOR EXCISION  . Application of cranial navigation  12/04/2014    Procedure: APPLICATION OF CRANIAL NAVIGATION;  Surgeon: [name], MD;  Location: [facility] ORS;  Service: Neurosurgery;;  . Darden Dates without cardioversion N/A 05/06/2015    Procedure: TRANSESOPHAGEAL ECHOCARDIOGRAM (TEE);  Surgeon: [name], MD;  Location: [facility];  Service: Cardiovascular;  Laterality: N/A;    REVIEW OF SYSTEMS:  Constitutional: positive for fatigue Eyes: negative Ears, nose, mouth, throat, and face: negative Respiratory: positive for dyspnea on exertion Cardiovascular: negative Gastrointestinal: negative Genitourinary:negative Integument/breast:  negative Hematologic/lymphatic: positive for Ecchymosis in the abdomen there were Musculoskeletal:positive for muscle weakness Neurological: negative Behavioral/Psych: negative Endocrine: negative Allergic/Immunologic: negative   PHYSICAL EXAMINATION: General appearance: alert, cooperative, fatigued and no distress Head: Normocephalic, without obvious abnormality, atraumatic Neck: no adenopathy, no carotid bruit, supple, symmetrical, trachea midline and thyroid not enlarged, symmetric, no tenderness/mass/nodules Lymph nodes: Cervical, supraclavicular, and axillary nodes normal. Resp: clear to auscultation bilaterally Back: symmetric, no curvature. ROM normal. No CVA tenderness. Cardio: regular rate and rhythm, S1, S2 normal, no murmur, click, rub or gallop GI: soft, non-tender; bowel sounds normal; no masses,  no organomegaly Extremities: extremities normal, atraumatic, no cyanosis or edema Neurologic: Alert and oriented X 3, normal strength and tone. Normal symmetric reflexes. Normal coordination and gait  Skin exam: Significant ecchymosis in the abdominal wall at the site of the Lovenox injection.  ECOG PERFORMANCE STATUS: 2 - Symptomatic, <50% confined to bed  Blood pressure 113/70, pulse 85, temperature 97.7 F (36.5 C), temperature source Oral, resp. rate 17, height 6' (1.829 m), weight 184 lb 12.8 oz (83.825 kg), SpO2 97 %.  LABORATORY DATA: Lab Results  Component Value Date   WBC 5.3 06/23/2015   HGB 9.2* 06/23/2015   HCT 30.0* 06/23/2015   MCV 96.8 06/23/2015   PLT 34* 06/23/2015      Chemistry      Component Value Date/Time   NA 142 06/23/2015 1046   NA 142 05/26/2015 0235   K 4.3 06/23/2015 1046   K 4.3 05/26/2015 0235   CL 106 05/26/2015 0235   CO2 28 06/23/2015 1046   CO2 29 05/26/2015 0235   BUN 25.0 06/23/2015 1046   BUN 19 05/26/2015 0235   CREATININE 0.9 06/23/2015 1046   CREATININE 0.66 05/26/2015 0235      Component Value Date/Time   CALCIUM 9.5  06/23/2015 1046   CALCIUM 8.9 05/26/2015 0235   ALKPHOS 76 06/23/2015 1046   ALKPHOS 76 05/25/2015 0956   AST 15 06/23/2015 1046   AST 17 05/25/2015 0956   ALT 33 06/23/2015 1046   ALT 25 05/25/2015 0956   BILITOT 0.94 06/23/2015 1046   BILITOT 1.6* 05/25/2015 1607  RADIOGRAPHIC STUDIES: Dg Chest 1 View  05/25/2015  CLINICAL DATA:  Post right-sided thoracentesis. EXAM: CHEST 1 VIEW COMPARISON:  05/25/2015; 05/21/2015; chest CT - 05/21/2015 FINDINGS: Interval reduction / near resolution of right-sided pleural effusion post thoracentesis. No pneumothorax. Grossly unchanged cardiac silhouette and mediastinal contours. Right perihilar heterogeneous opacities are unchanged with persistent moderate elevation of the right hemidiaphragm. Well-defined retrocardiac opacity compatible with known Bochdalek hernia. No evidence of edema. Stable position of support apparatus. No acute osseus abnormalities. IMPRESSION: 1. Interval reduction / near resolution of right-sided effusion post thoracentesis. No pneumothorax. 2. Stable sequela of perihilar radiation change in associated volume loss. 3. Unchanged well-defined retrocardiac opacity compatible with known Bochdalek hernia. Electronically Signed   By: Sandi Mariscal M.D.   On: 05/25/2015 16:16   Dg Chest 2 View  05/25/2015  CLINICAL DATA:  Recurrent pulmonary embolism. History of coronary artery disease, COPD and non-small-cell lung cancer. Weakness and shortness of breath today. EXAM: CHEST  2 VIEW COMPARISON:  Radiographs and CT 05/21/2015. FINDINGS: There are lower lung volumes with enlargement of the subpulmonic right pleural effusion and increased patchy airspace opacities at both lung bases. Underlying right perihilar density corresponds with radiation fibrosis on CT. The heart size and mediastinal contours are stable. Left subclavian AICD leads appear unchanged. IMPRESSION: Worsening aeration of the lung bases with enlarging right pleural effusion  and patchy bibasilar airspace opacities. Electronically Signed   By: Richardean Sale M.D.   On: 05/25/2015 10:36   US Thoracentesis Asp Pleural Space W/img Guide  05/25/2015  INDICATION: Patient with stage IV lung cancer, prostate cancer, recurrent PE, dyspnea, recurrent right pleural effusion. Request is made for diagnostic and therapeutic right thoracentesis. EXAM: ULTRASOUND GUIDED DIAGNOSTIC AND THERAPEUTIC RIGHT THORACENTESIS COMPARISON:  Prior thoracentesis on 05/05/2015 MEDICATIONS: None COMPLICATIONS: None immediate TECHNIQUE: Informed written consent was obtained from the patient after a discussion of the risks, benefits and alternatives to treatment. A timeout was performed prior to the initiation of the procedure. Initial ultrasound scanning demonstrates a small to moderate right pleural effusion. The lower chest was prepped and draped in the usual sterile fashion. 1% lidocaine was used for local anesthesia. An ultrasound image was saved for documentation purposes. A 6 Fr Safe-T-Centesis catheter was introduced. The thoracentesis was performed. The catheter was removed and a dressing was applied. The patient tolerated the procedure well without immediate post procedural complication. The patient was escorted to have an upright chest radiograph. FINDINGS: A total of approximately 630 cc of hazy, blood-tinged fluid was removed. Requested samples were sent to the laboratory. IMPRESSION: Successful ultrasound-guided diagnostic and therapeutic right sided thoracentesis yielding 630 cc of pleural fluid. Read by: Rowe Robert, PA-C Electronically Signed   By: Sandi Mariscal M.D.   On: 05/25/2015 16:01    ASSESSMENT AND PLAN: this is a very pleasant 80 years old white male with stage IV non-small cell lung cancer, adenocarcinoma with positive PDL 1 expression, completed systemic chemotherapy with carboplatin and Alimta status post 6 cycles and tolerated his treatment fairly well.  He is currently on  treatment with immunotherapy with Ketruda (pembrolizumab) status post 13 cycles.  Unfortunately he continues to have significant thrombocytopenia and I will continue to hold his treatment for now. He would come back for follow-up visit in 3 weeks for reevaluation before starting the next cycle of his treatment. For the recurrent acute pulmonary embolism, the patient will continue on Lovenox but I will reduce the dose to 1.5 MG/KG subcutaneously on daily basis rather than  twice a day because of the significant ecchymosis in the abdominal wall. For the thrombocytopenia, I recommended for the patient to discontinue treatment with gabapentin and I will continue to monitor his platelets count closely on the upcoming bloodwork. 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 For history of COPD, I recommended for the patient to continue treatment with Decadron and I will increase the dose to 4 mg by mouth twice a day. He was advised to call if he has any concerning symptoms in the interval. The patient was advised to call immediately if he has any concerning symptoms in the interval. The patient voices understanding of current disease status and treatment options and is in agreement with the current care plan.  All questions were answered. The patient knows to call the clinic with any problems, questions or concerns. We can certainly see the patient much sooner if necessary.  Disclaimer: This note was dictated with voice recognition software. Similar sounding words can inadvertently be transcribed and may not be corrected upon review.

## 2015-06-23 NOTE — Telephone Encounter (Signed)
Talked to patient here in office. Scheduled appt.       [facility]. °

## 2015-06-26 ENCOUNTER — Telehealth: Payer: Self-pay | Admitting: Internal Medicine

## 2015-06-26 NOTE — Telephone Encounter (Signed)
Spoke with patient/relative re next appointment for 1/31 and patient new schedule at next visit.

## 2015-07-03 ENCOUNTER — Telehealth: Payer: Self-pay | Admitting: Medical Oncology

## 2015-07-03 ENCOUNTER — Encounter: Payer: Self-pay | Admitting: Physical Therapy

## 2015-07-03 NOTE — Telephone Encounter (Signed)
Daughter in law reached out to hospice for an evaluation. Per Michail Sermon told Manuela Schwartz he is fine is Hospice as long as pt wants hospice.

## 2015-07-03 NOTE — Therapy (Signed)
[facility] [address] [place], [place], [address] Phone: [phone]   Fax:  [phone]  Patient Details  Name: Nicholas Schmitt MRN: [id number] Date of Birth: [birth year] Referring Provider:  No ref. provider found  Encounter Date: 07/03/2015  PHYSICAL THERAPY DISCHARGE SUMMARY  Visits from Start of Care: 9  Current functional level related to goals / functional outcomes:     PT Short Term Goals - 03/17/15 1154    PT SHORT TERM GOAL #1   Title Pt will perform HEP with family supervision for improved strength, balance, mobility.  Target 03/14/15   Status Achieved   PT SHORT TERM GOAL #2   Title Pt will improve Berg Balance score to at least 37/56 for decreased fall risk.   Baseline 32/56 at goal check 03/17/15   Status Not Met   PT SHORT TERM GOAL #3   Title Pt will improve TUG score to less than or equal to 13.5 seconds for decreased fall risk.   Baseline 12.99 sec 03/17/15   Status Achieved   PT SHORT TERM GOAL #4   Title Pt will ambulate at least 200 ft. using least restrictive assistive device (cane vs. RW) modified independently, for improved functional mobility.   Baseline 240 ft requires min guard assistance/supervision   Status Partially Met         PT Long Term Goals - 02/12/15 1419    PT LONG TERM GOAL #1   Title Pt/family will verbalize understanding of fall prevention within home environment.  TARGET 04/14/15   Time 8   Period Weeks   Status New   PT LONG TERM GOAL #2   Title Pt will improve Berg Balance score to at least 42/56 for decreased fall risk.   Time 8   Period Weeks   Status New   PT LONG TERM GOAL #3   Title Pt will improve gait velocity to at least 2.62 ft/sec for improved gait efficiency and safety.   Time 8   Period Weeks   Status New   PT LONG TERM GOAL #4   Title Pt will ambulate at least 500 ft, indoor and outdoor surfaces, modified independently, for improved functional  mobility.   Time 8   Period Weeks   Status New   PT LONG TERM GOAL #5   Title Pt will verbalize plans for continued community fitness upon D/C from PT.   Time 8   Status New    LTGs not fully addressed due to pt's ongoing medical issues/multiple cancellations.   Remaining deficits: Strength, endurance, balance   Education / Equipment: HEP  Plan: Patient agrees to discharge.  Patient goals were not met. Patient is being discharged due to a change in medical status.  ?????      Rishaan Gunner W. 07/03/2015, 8:24 AM Frazier Butt., PT  [facility] [address] [place], [place], [address] Phone: [phone]   Fax:  [phone]

## 2015-07-08 ENCOUNTER — Emergency Department (HOSPITAL_COMMUNITY)
Admission: EM | Admit: 2015-07-08 | Discharge: 2015-07-08 | Disposition: A | Discharge status: Home / Self Care | Payer: PPO | Attending: Emergency Medicine | Admitting: Emergency Medicine

## 2015-07-08 ENCOUNTER — Telehealth: Payer: Self-pay | Admitting: *Deleted

## 2015-07-08 ENCOUNTER — Encounter (HOSPITAL_COMMUNITY): Payer: Self-pay

## 2015-07-08 DIAGNOSIS — N189 Chronic kidney disease, unspecified: Secondary | ICD-10-CM | POA: Diagnosis not present

## 2015-07-08 DIAGNOSIS — Z87891 Personal history of nicotine dependence: Secondary | ICD-10-CM | POA: Diagnosis not present

## 2015-07-08 DIAGNOSIS — Z79899 Other long term (current) drug therapy: Secondary | ICD-10-CM | POA: Diagnosis not present

## 2015-07-08 DIAGNOSIS — H9190 Unspecified hearing loss, unspecified ear: Secondary | ICD-10-CM | POA: Diagnosis not present

## 2015-07-08 DIAGNOSIS — Z7951 Long term (current) use of inhaled steroids: Secondary | ICD-10-CM | POA: Insufficient documentation

## 2015-07-08 DIAGNOSIS — Z85118 Personal history of other malignant neoplasm of bronchus and lung: Secondary | ICD-10-CM

## 2015-07-08 DIAGNOSIS — Z85848 Personal history of malignant neoplasm of other parts of nervous tissue: Secondary | ICD-10-CM | POA: Diagnosis not present

## 2015-07-08 DIAGNOSIS — I252 Old myocardial infarction: Secondary | ICD-10-CM | POA: Insufficient documentation

## 2015-07-08 DIAGNOSIS — Z85841 Personal history of malignant neoplasm of brain: Secondary | ICD-10-CM | POA: Diagnosis not present

## 2015-07-08 DIAGNOSIS — Z950 Presence of cardiac pacemaker: Secondary | ICD-10-CM | POA: Insufficient documentation

## 2015-07-08 DIAGNOSIS — D696 Thrombocytopenia, unspecified: Secondary | ICD-10-CM | POA: Diagnosis not present

## 2015-07-08 DIAGNOSIS — R040 Epistaxis: Secondary | ICD-10-CM

## 2015-07-08 DIAGNOSIS — K219 Gastro-esophageal reflux disease without esophagitis: Secondary | ICD-10-CM | POA: Insufficient documentation

## 2015-07-08 DIAGNOSIS — Z7952 Long term (current) use of systemic steroids: Secondary | ICD-10-CM | POA: Insufficient documentation

## 2015-07-08 DIAGNOSIS — Z8701 Personal history of pneumonia (recurrent): Secondary | ICD-10-CM | POA: Diagnosis not present

## 2015-07-08 DIAGNOSIS — I2510 Atherosclerotic heart disease of native coronary artery without angina pectoris: Secondary | ICD-10-CM | POA: Diagnosis not present

## 2015-07-08 DIAGNOSIS — J449 Chronic obstructive pulmonary disease, unspecified: Secondary | ICD-10-CM | POA: Diagnosis not present

## 2015-07-08 DIAGNOSIS — Z9861 Coronary angioplasty status: Secondary | ICD-10-CM | POA: Insufficient documentation

## 2015-07-08 DIAGNOSIS — I129 Hypertensive chronic kidney disease with stage 1 through stage 4 chronic kidney disease, or unspecified chronic kidney disease: Secondary | ICD-10-CM | POA: Insufficient documentation

## 2015-07-08 DIAGNOSIS — Z792 Long term (current) use of antibiotics: Secondary | ICD-10-CM | POA: Insufficient documentation

## 2015-07-08 LAB — CBC WITH DIFFERENTIAL/PLATELET
Basophils Absolute: 0 10*3/uL (ref 0.0–0.1)
Basophils Relative: 0 %
EOS PCT: 0 %
Eosinophils Absolute: 0 10*3/uL (ref 0.0–0.7)
HCT: 33.1 % — ABNORMAL LOW (ref 39.0–52.0)
Hemoglobin: 10.3 g/dL — ABNORMAL LOW (ref 13.0–17.0)
LYMPHS ABS: 0.6 10*3/uL — AB (ref 0.7–4.0)
LYMPHS PCT: 10 %
MCH: 30.8 pg (ref 26.0–34.0)
MCHC: 31.1 g/dL (ref 30.0–36.0)
MCV: 99.1 fL (ref 78.0–100.0)
MONO ABS: 0.5 10*3/uL (ref 0.1–1.0)
MONOS PCT: 8 %
Neutro Abs: 4.8 10*3/uL (ref 1.7–7.7)
Neutrophils Relative %: 82 %
PLATELETS: 23 10*3/uL — AB (ref 150–400)
RBC: 3.34 MIL/uL — ABNORMAL LOW (ref 4.22–5.81)
RDW: 20.8 % — AB (ref 11.5–15.5)
WBC: 5.9 10*3/uL (ref 4.0–10.5)

## 2015-07-08 LAB — BASIC METABOLIC PANEL
Anion gap: 11 (ref 5–15)
BUN: 34 mg/dL — AB (ref 6–20)
CHLORIDE: 105 mmol/L (ref 101–111)
CO2: 25 mmol/L (ref 22–32)
Calcium: 9.6 mg/dL (ref 8.9–10.3)
Creatinine, Ser: 0.7 mg/dL (ref 0.61–1.24)
GFR calc Af Amer: >60 mL/min (ref >60)
GLUCOSE: 114 mg/dL — AB (ref 65–99)
POTASSIUM: 4.4 mmol/L (ref 3.5–5.1)
Sodium: 141 mmol/L (ref 135–145)

## 2015-07-08 MED ORDER — OXYMETAZOLINE HCL 0.05 % NA SOLN
2.0000 | Freq: Once | NASAL | Status: AC
  Administered 2015-07-08: 2 via NASAL
  Filled 2015-07-08: qty 15

## 2015-07-08 NOTE — Discharge Instructions (Signed)
Hold your Lovenox until seen by Dr. Earlie Server.  Afrin nasal spray: 2 sprays in each nostril 3 times daily for the next several days.  Call Dr. Lew Dawes office this morning to arrange a follow-up appointment for today or tomorrow.  Return to the ER if symptoms significantly worsen or change.   Nosebleed Nosebleeds are common. They are due to a crack in the inside lining of your nose (mucous membrane) or from a small blood vessel that starts to bleed. Nosebleeds can be caused by many conditions, such as injury, infections, dry mucous membranes or dry climate, medicines, nose picking, and home heating and cooling systems. Most nosebleeds come from blood vessels in the front of your nose. HOME CARE INSTRUCTIONS   Try controlling your nosebleed by pinching your nostrils gently and continuously for at least 10 minutes.  Avoid blowing or sniffing your nose for a number of hours after having a nosebleed.  Do not put gauze inside your nose yourself. If your nose was packed by your health care provider, try to maintain the pack inside of your nose until your health care provider removes it.  If a gauze pack was used and it starts to fall out, gently replace it or cut off the end of it.  If a balloon catheter was used to pack your nose, do not cut or remove it unless your health care provider has instructed you to do that.  Avoid lying down while you are having a nosebleed. Sit up and lean forward.  Use a nasal spray decongestant to help with a nosebleed as directed by your health care provider.  Do not use petroleum jelly or mineral oil in your nose. These can drip into your lungs.  Maintain humidity in your home by using less air conditioning or by using a humidifier.  Aspirinand blood thinners make bleeding more likely. If you are prescribed these medicines and you suffer from nosebleeds, ask your health care provider if you should stop taking the medicines or adjust the dose. Do not stop  medicines unless directed by your health care provider  Resume your normal activities as you are able, but avoid straining, lifting, or bending at the waist for several days.  If your nosebleed was caused by dry mucous membranes, use over-the-counter saline nasal spray or gel. This will keep the mucous membranes moist and allow them to heal. If you must use a lubricant, choose the water-soluble variety. Use it only sparingly, and do not use it within several hours of lying down.  Keep all follow-up visits as directed by your health care provider. This is important. SEEK MEDICAL CARE IF:  You have a fever.  You get frequent nosebleeds.  You are getting nosebleeds more often. SEEK IMMEDIATE MEDICAL CARE IF:  Your nosebleed lasts longer than 20 minutes.  Your nosebleed occurs after an injury to your face, and your nose looks crooked or broken.  You have unusual bleeding from other parts of your body.  You have unusual bruising on other parts of your body.  You feel light-headed or you faint.  You become sweaty.  You vomit blood.  Your nosebleed occurs after a head injury.   This information is not intended to replace advice given to you by your health care provider. Make sure you discuss any questions you have with your health care provider.   Document Released: 03/09/2005 Document Revised: 06/20/2014 Document Reviewed: 01/13/2014 Elsevier Interactive Patient Education 2016 [employer].  Thrombocytopenia Thrombocytopenia is a condition in  which there is an abnormally small number of platelets in your blood. Platelets are also called thrombocytes. Platelets are needed for blood clotting. CAUSES Thrombocytopenia is caused by:   Decreased production of platelets. This can be caused by:  Aplastic anemia in which your bone marrow quits making blood cells.  Cancer in the bone marrow.  Use of certain medicines, including chemotherapy.  Infection in the bone marrow.  Heavy  alcohol consumption.  Increased destruction of platelets. This can be caused by:  Certain immune diseases.  Use of certain drugs.  Certain blood clotting disorders.  Certain inherited disorders.  Certain bleeding disorders.  Pregnancy.  Having an enlarged spleen (hypersplenism). In hypersplenism, the spleen gathers up platelets from circulation. This means the platelets are not available to help with blood clotting. The spleen can enlarge due to cirrhosis or other conditions. SYMPTOMS  The symptoms of thrombocytopenia are side effects of poor blood clotting. Some of these are:  Abnormal bleeding.  Nosebleeds.  Heavy menstrual periods.  Blood in the urine or stools.  Purpura. This is a purplish discoloration in the skin produced by small bleeding vessels near the surface of the skin.  Bruising.  A rash that may be petechial. This looks like pinpoint, purplish-red spots on the skin and mucous membranes. It is caused by bleeding from small blood vessels (capillaries). DIAGNOSIS  Your caregiver will make this diagnosis based on your exam and blood tests. Sometimes, a bone marrow study is done to look for the original cells (megakaryocytes) that make platelets. TREATMENT  Treatment depends on the cause of the condition.  Medicines may be given to help protect your platelets from being destroyed.  In some cases, a replacement (transfusion) of platelets may be required to stop or prevent bleeding.  Sometimes, the spleen must be surgically removed. HOME CARE INSTRUCTIONS   Check the skin and linings inside your mouth for bruising or bleeding as directed by your caregiver.  Check your sputum, urine, and stool for blood as directed by your caregiver.  Do not return to any activities that could cause bumps or bruises until your caregiver says it is okay.  Take extra care not to cut yourself when shaving or when using scissors, needles, knives, and other tools.  Take extra  care not to burn yourself when ironing or cooking.  Ask your caregiver if it is okay for you to drink alcohol.  Only take over-the-counter or prescription medicines as directed by your caregiver.  Notify all your caregivers, including dentists and eye doctors, about your condition. SEEK IMMEDIATE MEDICAL CARE IF:   You develop active bleeding from anywhere in your body.  You develop unexplained bruising or bleeding.  You have blood in your sputum, urine, or stool. MAKE SURE YOU:  Understand these instructions.  Will watch your condition.  Will get help right away if you are not doing well or get worse.   This information is not intended to replace advice given to you by your health care provider. Make sure you discuss any questions you have with your health care provider.   Document Released: 05/30/2005 Document Revised: 08/22/2011 Document Reviewed: 12/01/2014 Elsevier Interactive Patient Education [employer].

## 2015-07-08 NOTE — Telephone Encounter (Signed)
Son called for clarification on lovenox. Discused with Louie Casa Per MD pt is to stop lovenox until platlets recover. I have spoke with Opal Sidles RN at [facility] and informed her as well. Louie Casa verbalized understanding.

## 2015-07-08 NOTE — ED Notes (Signed)
EMS has been out twice tonight to patients facility for his nosebleed, he states that he;s had multiple nosebleeds today, usually pressure stops the bleeding. Pt is on blood thinners.

## 2015-07-08 NOTE — Progress Notes (Signed)
Per RN, pt has a nosebleed and needs humidified O2.  Pt placed on 35% humidified aerosol mask.  Pt is tolerating well at this time.  HR65, spo2 94%.  RN aware.

## 2015-07-08 NOTE — Telephone Encounter (Signed)
Call from Erin at [facility]. Pt went to ED due to nose bleeds. RN questioned if pt should continue lovenox. Reviewed with MD, instructed Nicholas Schmitt to stop Lovenox until pt's platelets have recovered.  Pt will f/u with MD on 1/31. No further concerns.Nicholas Schmitt verbalized understanding.

## 2015-07-08 NOTE — ED Notes (Signed)
Bed: QP59 Expected date:  Expected time:  Means of arrival:  Comments: EMS 80 yo male nosebleed/on blood thinners

## 2015-07-08 NOTE — ED Notes (Signed)
Pt placed on humidified O2 due to decreased O2 sats and increasing nosebleeds

## 2015-07-08 NOTE — ED Notes (Signed)
Report given to facility, [place]

## 2015-07-08 NOTE — ED Notes (Signed)
[facility] called for transportation back to facility

## 2015-07-08 NOTE — ED Provider Notes (Signed)
CSN: [id number]     Arrival date & time 07/08/15  2202 History  By signing my name below, I, Nicholas Schmitt, attest that this documentation has been prepared under the direction and in the presence of Nicholas Speak, MD. Electronically Signed: Soijett Schmitt, ED Scribe. 07/08/2015. 3:55 AM.   Chief Complaint  Patient presents with  . Epistaxis      The history is provided by the patient. No language interpreter was used.    HPI Comments: Nicholas Schmitt is a 80 y.o. male with a medical hx of CAD, COPD, lung CA with chemotherapy, MI, who presents to the Emergency Department via EMS complaining of persistent epistaxis onset 3-4 weeks worsening 2 days. Pt denies trauma at this time. He notes that he is having nasal bleeding to the bilateral nares. Pt reports that he has been using nasal spray recently and he is unsure of the name of the spray. He denies being referred to an ENT specialist. He states that he has tried applying pressure with no relief for his symptoms. He denies any other symptoms. Pt is currently on Lovenox, but denies being on coumadin or xarelto.    Past Medical History  Diagnosis Date  . CAD (coronary artery disease)     positive stress test in 2006 led to left heart cath (8/06) showing 95% prox RCA, 90% CFX, and 90% mLAD.  patient had a cypher DES to all 3 lesions  . Hyperlipidemia   . Hearing loss   . AAA (abdominal aortic aneurysm) ([place]) 11/09    3.2 cm   . Chronic renal insufficiency   . History of radiation therapy 03/08/04- 04/08/04    prostate 4600 cGy in 3 fractions, radioactive seed implant 05/04/04  . Myocardial infarction ([place]) 1995  . Pacemaker 2014  . Shortness of breath     exertion  . COPD (chronic obstructive pulmonary disease) ([place])   . Radiation Jan.11-Jan 29/2016    Right central chest 35 gray in 14 fractions  . Radiation Jan.2016    35 gray in 14 fx lower lumbar/upper sacrum  . Diverticulosis   . Hemorrhoids   . Pneumonia     hx  . GERD  (gastroesophageal reflux disease)   . Hypertension     no med now for 3-4 months  . Prostate cancer ([place]) 2005    gleason 7  . Skin cancer     ear  . Lung cancer ([place])   . Cancer [facility]) 2015    prostate   . Cancer [facility]) December 04, 2014    Cranial   . Cancer [facility]) 04/01/2015    spine   Past Surgical History  Procedure Laterality Date  . Ptca  2006  . Coronary stent placement    . Permanent pacemaker insertion Bilateral 02/26/13    MDT Adapta L implanted by Dr Rayann Heman for sick sinus syndrome  . Radioactive seed implant  05/04/2004    8000 cGy, Dr Danny Lawless Dr Reece Agar  . Elbow surgery Bilateral 1973  . Coronary angioplasty    . Mediastinoscopy N/A 01/28/2014    Procedure: MEDIASTINOSCOPY;  Surgeon: Melrose Nakayama, MD;  Location: [place];  Service: Thoracic;  Laterality: N/A;  . Permanent pacemaker insertion N/A 02/26/2013    Procedure: PERMANENT PACEMAKER INSERTION;  Surgeon: [name], MD;  Location: [place] CATH LAB;  Service: Cardiovascular;  Laterality: N/A;  . Insert / replace / remove pacemaker      not removed  . Craniotomy N/A 12/04/2014  Procedure: CRANIOTOMY TUMOR EXCISION, LEFT;  Surgeon: [name], MD;  Location: [place] NEURO ORS;  Service: Neurosurgery;  Laterality: N/A;  CRANIOTOMY TUMOR EXCISION  . Application of cranial navigation  12/04/2014    Procedure: APPLICATION OF CRANIAL NAVIGATION;  Surgeon: [name], MD;  Location: [place] ORS;  Service: Neurosurgery;;  . [place] Dates without cardioversion N/A 05/06/2015    Procedure: TRANSESOPHAGEAL ECHOCARDIOGRAM (TEE);  Surgeon: [name], MD;  Location: [facility] ENDOSCOPY;  Service: Cardiovascular;  Laterality: N/A;   Family History  Problem Relation Age of Onset  . Asthma Sister   . Asthma Brother   . Coronary artery disease Other    Social History  Substance Use Topics  . Smoking status: Former Smoker -- 3.00 packs/day for 35 years    Types: Cigarettes    Quit date: 07/30/1993  . Smokeless tobacco: Never Used  .  Alcohol Use: No     Comment: hx alcohol abuse per note dated 04/11/11    Review of Systems  A complete 10 system review of systems was obtained and all systems are negative except as noted in the HPI and PMH.   Allergies  Review of patient's allergies indicates no known allergies.  Home Medications   Prior to Admission medications   Medication Sig Start Date End Date Taking? Authorizing Provider  albuterol (PROVENTIL HFA;VENTOLIN HFA) 108 (90 BASE) MCG/ACT inhaler Inhale into the lungs every 6 (six) hours as needed for wheezing or shortness of breath.    Historical Provider, MD  ALPRAZolam [name]) 0.25 MG tablet Take 1 tablet (0.25 mg total) by mouth 2 (two) times daily as needed for anxiety. 05/09/15   [name], MD  benzonatate (TESSALON) 100 MG capsule Take 1 capsule (100 mg total) by mouth 3 (three) times daily as needed for cough. 05/26/15   [name], MD  budesonide-formoterol (SYMBICORT) 160-4.5 MCG/ACT inhaler Inhale 2 puffs into the lungs 2 (two) times daily. 07/25/14   [name], MD  dexamethasone (DECADRON) 4 MG tablet Take 4 mg by mouth daily.    Historical Provider, MD  docusate sodium (COLACE) 100 MG capsule Take 100 mg by mouth at bedtime as needed for mild constipation.    Historical Provider, MD  enoxaparin (LOVENOX) 80 MG/0.8ML injection Inject 0.7 mLs (70 mg total) into the skin every 12 (twelve) hours. 05/27/15   [name], MD  famotidine (PEPCID) 20 MG tablet Take 20 mg by mouth at bedtime.    Historical Provider, MD  ferrous sulfate 325 (65 FE) MG tablet Take 1 tablet (325 mg total) by mouth daily with breakfast. 05/27/15   [name], MD  fluticasone (FLONASE) 50 MCG/ACT nasal spray USE 2 SPRAYS IN EACH NOSTRIL AT NIGHT 03/10/15   Historical Provider, MD  folic acid (FOLVITE) 1 MG tablet Take 1 tablet (1 mg total) by mouth daily. 05/27/15   [name], MD  gabapentin (NEURONTIN) 100 MG capsule TAKE 1 CAPS 3 TIMES DAY X5DAY, 2 CAPS 3 TIMES DAY  X5DAYS, 3 CAPS 3 TIMES DAY TIL DIRECTED OTHERWISE 06/09/15   [name], MD  gabapentin (NEURONTIN) 300 MG capsule Take 300 mg by mouth 3 (three) times daily.    Historical Provider, MD  guaiFENesin (MUCINEX) 600 MG 12 hr tablet Take 1 tablet (600 mg total) by mouth 2 (two) times daily. 05/26/15   [name], MD  lactulose (CHRONULAC) 10 GM/15ML solution Take 15 mLs by mouth 2 (two) times daily as needed. constipaton 04/06/15   Historical  Provider, MD  levofloxacin (LEVAQUIN) 750 MG tablet Take 1 tablet (750 mg total) by mouth daily. 05/26/15   [name], MD  levothyroxine (SYNTHROID) 50 MCG tablet Take 1 tablet (50 mcg total) by mouth daily before breakfast. 05/12/15   Curt Bears, MD  mirtazapine (REMERON) 30 MG tablet Take 1 tablet (30 mg total) by mouth at bedtime. 03/31/15   Curt Bears, MD  Multiple Vitamins-Minerals (THERA-M) TABS Take 1 tablet by mouth daily.    Historical Provider, MD  nitroGLYCERIN (NITROSTAT) 0.4 MG SL tablet Place 0.4 mg under the tongue every 5 (five) minutes as needed for chest pain (MAX 3 TABLETS). 06/05/13   Dorena Cookey, MD  oxyCODONE (OXY IR/ROXICODONE) 5 MG immediate release tablet Take 1 tablet (5 mg total) by mouth every 6 (six) hours as needed for severe pain. 05/09/15   [name], MD  prochlorperazine (COMPAZINE) 10 MG tablet Take 10 mg by mouth every 6 (six) hours as needed for nausea or vomiting.    Historical Provider, MD  senna (SENOKOT) 8.6 MG tablet Take 1 tablet by mouth at bedtime as needed for constipation.    Historical Provider, MD  tamsulosin (FLOMAX) 0.4 MG CAPS capsule Take 0.4 mg by mouth at bedtime.  10/09/14   Historical Provider, MD   BP 136/82 mmHg  Pulse 89  Temp(Src) 98 F (36.7 C) (Oral)  Resp 18  SpO2 91% Physical Exam  Constitutional: He is oriented to person, place, and time. He appears well-developed and well-nourished. No distress.  HENT:  Head: Normocephalic and atraumatic.  Right Ear: Hearing  normal.  Left Ear: Hearing normal.  Nose: Nose normal.  Mouth/Throat: Oropharynx is clear and moist and mucous membranes are normal.  Dried blood in both nares, however no active bleeding or obvious site of bleeding.   Eyes: Conjunctivae and EOM are normal. Pupils are equal, round, and reactive to light.  Neck: Normal range of motion. Neck supple.  Cardiovascular: Regular rhythm, S1 normal and S2 normal.  Exam reveals no gallop and no friction rub.   No murmur heard. Pulmonary/Chest: Effort normal and breath sounds normal. No respiratory distress. He exhibits no tenderness.  Abdominal: Soft. Normal appearance and bowel sounds are normal. There is no hepatosplenomegaly. There is no tenderness. There is no rebound, no guarding, no tenderness at McBurney's point and negative Murphy's sign. No hernia.  Musculoskeletal: Normal range of motion.  Neurological: He is alert and oriented to person, place, and time. He has normal strength. No cranial nerve deficit or sensory deficit. Coordination normal. GCS eye subscore is 4. GCS verbal subscore is 5. GCS motor subscore is 6.  Skin: Skin is warm, dry and intact. No rash noted. No cyanosis.  Psychiatric: He has a normal mood and affect. His speech is normal and behavior is normal. Thought content normal.  Nursing note and vitals reviewed.   ED Course  Procedures (including critical care time) DIAGNOSTIC STUDIES: Oxygen Saturation is 91% on RA, nl by my interpretation.    COORDINATION OF CARE: 3:54 AM Discussed treatment plan with pt at bedside which includes labs and pt agreed to plan.    Labs Review Labs Reviewed - No data to display  Imaging Review No results found. I have personally reviewed and evaluated these lab results as part of my medical decision-making.   EKG Interpretation None      MDM   Final diagnoses:  None    Patient is a 80 year old male with history of stage IV lung  cancer. He is on Lovenox for recurrent  pulmonary emboli and has also been thrombocytopenic for the past several months. He presents today with nosebleed that he states has been occurring intermittently for several weeks and became worse last night. He is on home oxygen.   Today's workup reveals a hemoglobin which is somewhat higher than his baseline, however a platelet count which is lower than his baseline. He typically runs in the 30s to 40s and today's platelet count is 23. I've discussed this with Dr. Benay Spice from oncology who is recommending holding the patient's Lovenox and having the patient follow-up today or tomorrow with Dr. Julien Nordmann.  The patient is having no bleeding at present. He was given Afrin nasal spray which seems to be working well. He will be discharged with this to be used several times daily.  I personally performed the services described in this documentation, which was scribed in my presence. The recorded information has been reviewed and is accurate.       Nicholas Speak, MD 07/08/15 [phone]

## 2015-07-12 ENCOUNTER — Emergency Department (HOSPITAL_COMMUNITY): Payer: PPO

## 2015-07-12 ENCOUNTER — Inpatient Hospital Stay (HOSPITAL_COMMUNITY)
Admission: EM | Admit: 2015-07-12 | Discharge: 2015-07-14 | DRG: 871 | Disposition: A | Discharge status: Hospice | Payer: PPO | Attending: Internal Medicine | Admitting: Internal Medicine

## 2015-07-12 ENCOUNTER — Encounter (HOSPITAL_COMMUNITY): Payer: Self-pay | Admitting: Emergency Medicine

## 2015-07-12 DIAGNOSIS — C7951 Secondary malignant neoplasm of bone: Secondary | ICD-10-CM | POA: Diagnosis present

## 2015-07-12 DIAGNOSIS — Z7901 Long term (current) use of anticoagulants: Secondary | ICD-10-CM

## 2015-07-12 DIAGNOSIS — Z923 Personal history of irradiation: Secondary | ICD-10-CM

## 2015-07-12 DIAGNOSIS — I252 Old myocardial infarction: Secondary | ICD-10-CM

## 2015-07-12 DIAGNOSIS — E039 Hypothyroidism, unspecified: Secondary | ICD-10-CM | POA: Diagnosis present

## 2015-07-12 DIAGNOSIS — I2510 Atherosclerotic heart disease of native coronary artery without angina pectoris: Secondary | ICD-10-CM | POA: Diagnosis present

## 2015-07-12 DIAGNOSIS — I714 Abdominal aortic aneurysm, without rupture: Secondary | ICD-10-CM | POA: Diagnosis present

## 2015-07-12 DIAGNOSIS — Z9981 Dependence on supplemental oxygen: Secondary | ICD-10-CM | POA: Diagnosis not present

## 2015-07-12 DIAGNOSIS — E785 Hyperlipidemia, unspecified: Secondary | ICD-10-CM | POA: Diagnosis present

## 2015-07-12 DIAGNOSIS — Z87891 Personal history of nicotine dependence: Secondary | ICD-10-CM | POA: Diagnosis not present

## 2015-07-12 DIAGNOSIS — D630 Anemia in neoplastic disease: Secondary | ICD-10-CM | POA: Diagnosis present

## 2015-07-12 DIAGNOSIS — R040 Epistaxis: Secondary | ICD-10-CM | POA: Diagnosis present

## 2015-07-12 DIAGNOSIS — J9621 Acute and chronic respiratory failure with hypoxia: Secondary | ICD-10-CM | POA: Diagnosis present

## 2015-07-12 DIAGNOSIS — J449 Chronic obstructive pulmonary disease, unspecified: Secondary | ICD-10-CM | POA: Diagnosis not present

## 2015-07-12 DIAGNOSIS — Z515 Encounter for palliative care: Secondary | ICD-10-CM | POA: Diagnosis present

## 2015-07-12 DIAGNOSIS — Z8546 Personal history of malignant neoplasm of prostate: Secondary | ICD-10-CM | POA: Diagnosis not present

## 2015-07-12 DIAGNOSIS — Z66 Do not resuscitate: Secondary | ICD-10-CM | POA: Diagnosis present

## 2015-07-12 DIAGNOSIS — A419 Sepsis, unspecified organism: Secondary | ICD-10-CM | POA: Diagnosis present

## 2015-07-12 DIAGNOSIS — J189 Pneumonia, unspecified organism: Secondary | ICD-10-CM | POA: Diagnosis present

## 2015-07-12 DIAGNOSIS — I825Y1 Chronic embolism and thrombosis of unspecified deep veins of right proximal lower extremity: Secondary | ICD-10-CM

## 2015-07-12 DIAGNOSIS — D696 Thrombocytopenia, unspecified: Secondary | ICD-10-CM | POA: Diagnosis present

## 2015-07-12 DIAGNOSIS — Z9221 Personal history of antineoplastic chemotherapy: Secondary | ICD-10-CM

## 2015-07-12 DIAGNOSIS — J441 Chronic obstructive pulmonary disease with (acute) exacerbation: Secondary | ICD-10-CM | POA: Diagnosis present

## 2015-07-12 DIAGNOSIS — R0602 Shortness of breath: Secondary | ICD-10-CM | POA: Diagnosis present

## 2015-07-12 DIAGNOSIS — R05 Cough: Secondary | ICD-10-CM | POA: Diagnosis not present

## 2015-07-12 DIAGNOSIS — C3490 Malignant neoplasm of unspecified part of unspecified bronchus or lung: Secondary | ICD-10-CM | POA: Diagnosis present

## 2015-07-12 DIAGNOSIS — K649 Unspecified hemorrhoids: Secondary | ICD-10-CM | POA: Diagnosis present

## 2015-07-12 DIAGNOSIS — Z950 Presence of cardiac pacemaker: Secondary | ICD-10-CM | POA: Diagnosis not present

## 2015-07-12 DIAGNOSIS — Z86711 Personal history of pulmonary embolism: Secondary | ICD-10-CM | POA: Diagnosis not present

## 2015-07-12 DIAGNOSIS — Z6830 Body mass index (BMI) 30.0-30.9, adult: Secondary | ICD-10-CM

## 2015-07-12 DIAGNOSIS — J910 Malignant pleural effusion: Secondary | ICD-10-CM

## 2015-07-12 DIAGNOSIS — Y95 Nosocomial condition: Secondary | ICD-10-CM | POA: Diagnosis present

## 2015-07-12 DIAGNOSIS — C7931 Secondary malignant neoplasm of brain: Secondary | ICD-10-CM | POA: Diagnosis present

## 2015-07-12 DIAGNOSIS — R0600 Dyspnea, unspecified: Secondary | ICD-10-CM | POA: Diagnosis not present

## 2015-07-12 DIAGNOSIS — I82409 Acute embolism and thrombosis of unspecified deep veins of unspecified lower extremity: Secondary | ICD-10-CM | POA: Diagnosis present

## 2015-07-12 DIAGNOSIS — E032 Hypothyroidism due to medicaments and other exogenous substances: Secondary | ICD-10-CM

## 2015-07-12 DIAGNOSIS — I10 Essential (primary) hypertension: Secondary | ICD-10-CM | POA: Diagnosis present

## 2015-07-12 DIAGNOSIS — Z95828 Presence of other vascular implants and grafts: Secondary | ICD-10-CM

## 2015-07-12 HISTORY — DX: Other pulmonary embolism without acute cor pulmonale: I26.99

## 2015-07-12 LAB — URINALYSIS, ROUTINE W REFLEX MICROSCOPIC
BILIRUBIN URINE: NEGATIVE
Glucose, UA: NEGATIVE mg/dL
Hgb urine dipstick: NEGATIVE
KETONES UR: NEGATIVE mg/dL
LEUKOCYTES UA: NEGATIVE
NITRITE: NEGATIVE
Protein, ur: NEGATIVE mg/dL
SPECIFIC GRAVITY, URINE: 1.027 (ref 1.005–1.030)
pH: 5.5 (ref 5.0–8.0)

## 2015-07-12 LAB — COMPREHENSIVE METABOLIC PANEL
ALT: 19 U/L (ref 17–63)
AST: 20 U/L (ref 15–41)
Albumin: 3.9 g/dL (ref 3.5–5.0)
Alkaline Phosphatase: 76 U/L (ref 38–126)
Anion gap: 16 — ABNORMAL HIGH (ref 5–15)
BILIRUBIN TOTAL: 2 mg/dL — AB (ref 0.3–1.2)
BUN: 27 mg/dL — AB (ref 6–20)
CO2: 23 mmol/L (ref 22–32)
CREATININE: 0.67 mg/dL (ref 0.61–1.24)
Calcium: 10 mg/dL (ref 8.9–10.3)
Chloride: 101 mmol/L (ref 101–111)
Glucose, Bld: 118 mg/dL — ABNORMAL HIGH (ref 65–99)
POTASSIUM: 4.2 mmol/L (ref 3.5–5.1)
Sodium: 140 mmol/L (ref 135–145)
TOTAL PROTEIN: 7.2 g/dL (ref 6.5–8.1)

## 2015-07-12 LAB — CBC WITH DIFFERENTIAL/PLATELET
BASOS ABS: 0 10*3/uL (ref 0.0–0.1)
Basophils Relative: 0 %
EOS PCT: 1 %
Eosinophils Absolute: 0.1 10*3/uL (ref 0.0–0.7)
HEMATOCRIT: 33.9 % — AB (ref 39.0–52.0)
Hemoglobin: 10.4 g/dL — ABNORMAL LOW (ref 13.0–17.0)
LYMPHS ABS: 1.4 10*3/uL (ref 0.7–4.0)
LYMPHS PCT: 15 %
MCH: 30.6 pg (ref 26.0–34.0)
MCHC: 30.7 g/dL (ref 30.0–36.0)
MCV: 99.7 fL (ref 78.0–100.0)
MONO ABS: 0.9 10*3/uL (ref 0.1–1.0)
Monocytes Relative: 10 %
NEUTROS ABS: 6.9 10*3/uL (ref 1.7–7.7)
Neutrophils Relative %: 74 %
Platelets: 43 10*3/uL — ABNORMAL LOW (ref 150–400)
RBC: 3.4 MIL/uL — ABNORMAL LOW (ref 4.22–5.81)
RDW: 20.3 % — AB (ref 11.5–15.5)
WBC: 9.3 10*3/uL (ref 4.0–10.5)

## 2015-07-12 LAB — I-STAT CG4 LACTIC ACID, ED: LACTIC ACID, VENOUS: 4.39 mmol/L — AB (ref 0.5–2.0)

## 2015-07-12 LAB — INFLUENZA PANEL BY PCR (TYPE A & B)
H1N1FLUPCR: NOT DETECTED
INFLAPCR: NEGATIVE
INFLBPCR: NEGATIVE

## 2015-07-12 LAB — MRSA PCR SCREENING: MRSA by PCR: NEGATIVE

## 2015-07-12 LAB — APTT: aPTT: 36 seconds (ref 24–37)

## 2015-07-12 LAB — PROCALCITONIN: Procalcitonin: 1.28 ng/mL

## 2015-07-12 LAB — PROTIME-INR
INR: 1.6 — AB (ref 0.00–1.49)
PROTHROMBIN TIME: 18.5 s — AB (ref 11.6–15.2)

## 2015-07-12 LAB — LACTIC ACID, PLASMA
LACTIC ACID, VENOUS: 1.9 mmol/L (ref 0.5–2.0)
LACTIC ACID, VENOUS: 2 mmol/L (ref 0.5–2.0)

## 2015-07-12 LAB — I-STAT TROPONIN, ED: TROPONIN I, POC: 0.02 ng/mL (ref 0.00–0.08)

## 2015-07-12 MED ORDER — FOLIC ACID 1 MG PO TABS: 1.0000 mg | ORAL_TABLET | Freq: Every day | ORAL | Status: DC

## 2015-07-12 MED ORDER — SODIUM CHLORIDE 0.9 % IV SOLN
INTRAVENOUS | Status: DC
  Administered 2015-07-13: 23:00:00 via INTRAVENOUS
  Administered 2015-07-13: 1000 mL via INTRAVENOUS

## 2015-07-12 MED ORDER — VANCOMYCIN HCL IN DEXTROSE 1-5 GM/200ML-% IV SOLN: 1000.0000 mg | Freq: Once | INTRAVENOUS | Status: DC

## 2015-07-12 MED ORDER — OSELTAMIVIR PHOSPHATE 75 MG PO CAPS
75.0000 mg | ORAL_CAPSULE | Freq: Two times a day (BID) | ORAL | Status: DC
  Administered 2015-07-12 – 2015-07-13 (×2): 75 mg via ORAL
  Filled 2015-07-12 (×3): qty 1

## 2015-07-12 MED ORDER — LORAZEPAM 2 MG/ML IJ SOLN
1.0000 mg | Freq: Once | INTRAMUSCULAR | Status: AC
  Administered 2015-07-12: 0.5 mg via INTRAVENOUS
  Filled 2015-07-12: qty 1

## 2015-07-12 MED ORDER — NITROGLYCERIN 0.4 MG SL SUBL: 0.4000 mg | SUBLINGUAL_TABLET | SUBLINGUAL | Status: DC | PRN

## 2015-07-12 MED ORDER — HYDROCORTISONE ACE-PRAMOXINE 1-1 % RE FOAM: 1.0000 | Freq: Two times a day (BID) | RECTAL | Status: DC

## 2015-07-12 MED ORDER — SODIUM CHLORIDE 0.9 % IV BOLUS (SEPSIS)
2500.0000 mL | Freq: Once | INTRAVENOUS | Status: AC
  Administered 2015-07-12: 2500 mL via INTRAVENOUS

## 2015-07-12 MED ORDER — VANCOMYCIN HCL IN DEXTROSE 1-5 GM/200ML-% IV SOLN
1000.0000 mg | Freq: Once | INTRAVENOUS | Status: AC
  Administered 2015-07-12: 1000 mg via INTRAVENOUS
  Filled 2015-07-12: qty 200

## 2015-07-12 MED ORDER — ADULT MULTIVITAMIN W/MINERALS CH
1.0000 | ORAL_TABLET | Freq: Every day | ORAL | Status: DC
  Administered 2015-07-13 – 2015-07-14 (×2): 1 via ORAL
  Filled 2015-07-12 (×2): qty 1

## 2015-07-12 MED ORDER — CETYLPYRIDINIUM CHLORIDE 0.05 % MT LIQD
7.0000 mL | Freq: Two times a day (BID) | OROMUCOSAL | Status: DC
  Administered 2015-07-13 – 2015-07-14 (×3): 7 mL via OROMUCOSAL

## 2015-07-12 MED ORDER — BUDESONIDE-FORMOTEROL FUMARATE 160-4.5 MCG/ACT IN AERO
2.0000 | INHALATION_SPRAY | Freq: Two times a day (BID) | RESPIRATORY_TRACT | Status: DC
  Administered 2015-07-13 – 2015-07-14 (×3): 2 via RESPIRATORY_TRACT
  Filled 2015-07-12: qty 6

## 2015-07-12 MED ORDER — PIPERACILLIN-TAZOBACTAM 3.375 G IVPB
3.3750 g | Freq: Once | INTRAVENOUS | Status: AC
  Administered 2015-07-12: 3.375 g via INTRAVENOUS
  Filled 2015-07-12: qty 50

## 2015-07-12 MED ORDER — OXYCODONE HCL 5 MG PO TABS
5.0000 mg | ORAL_TABLET | Freq: Four times a day (QID) | ORAL | Status: DC | PRN
  Administered 2015-07-13: 5 mg via ORAL
  Filled 2015-07-12: qty 1

## 2015-07-12 MED ORDER — MIRTAZAPINE 15 MG PO TABS
30.0000 mg | ORAL_TABLET | Freq: Every day | ORAL | Status: DC
  Administered 2015-07-12 – 2015-07-13 (×2): 30 mg via ORAL
  Filled 2015-07-12 (×2): qty 2

## 2015-07-12 MED ORDER — SENNA 8.6 MG PO TABS: 1.0000 | ORAL_TABLET | Freq: Every evening | ORAL | Status: DC | PRN

## 2015-07-12 MED ORDER — GUAIFENESIN ER 600 MG PO TB12
600.0000 mg | ORAL_TABLET | Freq: Two times a day (BID) | ORAL | Status: DC
  Administered 2015-07-12 – 2015-07-14 (×4): 600 mg via ORAL
  Filled 2015-07-12 (×4): qty 1

## 2015-07-12 MED ORDER — HYDROCORTISONE ACETATE 25 MG RE SUPP
25.0000 mg | Freq: Two times a day (BID) | RECTAL | Status: DC
  Administered 2015-07-12 – 2015-07-14 (×4): 25 mg via RECTAL
  Filled 2015-07-12 (×6): qty 1

## 2015-07-12 MED ORDER — HYDROCORTISONE ACE-PRAMOXINE 1-1 % RE FOAM
1.0000 | Freq: Two times a day (BID) | RECTAL | Status: DC
  Administered 2015-07-12 – 2015-07-14 (×4): 1 via RECTAL
  Filled 2015-07-12: qty 10

## 2015-07-12 MED ORDER — LEVALBUTEROL HCL 0.63 MG/3ML IN NEBU
0.6300 mg | INHALATION_SOLUTION | Freq: Four times a day (QID) | RESPIRATORY_TRACT | Status: DC
  Administered 2015-07-12 – 2015-07-13 (×2): 0.63 mg via RESPIRATORY_TRACT
  Filled 2015-07-12 (×2): qty 3

## 2015-07-12 MED ORDER — DEXTROSE 5 % IV SOLN
2.0000 g | Freq: Once | INTRAVENOUS | Status: AC
  Administered 2015-07-12: 2 g via INTRAVENOUS
  Filled 2015-07-12: qty 2

## 2015-07-12 MED ORDER — THERA-M PO TABS: 1.0000 | ORAL_TABLET | Freq: Every day | ORAL | Status: DC

## 2015-07-12 MED ORDER — DOCUSATE SODIUM 100 MG PO CAPS: 100.0000 mg | ORAL_CAPSULE | Freq: Every evening | ORAL | Status: DC | PRN

## 2015-07-12 MED ORDER — LACTULOSE 10 GM/15ML PO SOLN
10.0000 g | Freq: Two times a day (BID) | ORAL | Status: DC | PRN
  Filled 2015-07-12: qty 15

## 2015-07-12 MED ORDER — ALPRAZOLAM 0.25 MG PO TABS
0.2500 mg | ORAL_TABLET | Freq: Two times a day (BID) | ORAL | Status: DC | PRN
  Administered 2015-07-12 – 2015-07-13 (×2): 0.25 mg via ORAL
  Filled 2015-07-12 (×3): qty 1

## 2015-07-12 MED ORDER — BUDESONIDE-FORMOTEROL FUMARATE 160-4.5 MCG/ACT IN AERO: 2.0000 | INHALATION_SPRAY | Freq: Two times a day (BID) | RESPIRATORY_TRACT | Status: DC

## 2015-07-12 MED ORDER — VANCOMYCIN HCL IN DEXTROSE 1-5 GM/200ML-% IV SOLN
1000.0000 mg | Freq: Two times a day (BID) | INTRAVENOUS | Status: DC
  Administered 2015-07-13 – 2015-07-14 (×3): 1000 mg via INTRAVENOUS
  Filled 2015-07-12 (×3): qty 200

## 2015-07-12 MED ORDER — ACETAMINOPHEN 650 MG RE SUPP
650.0000 mg | Freq: Once | RECTAL | Status: AC
Start: 2015-07-12 — End: 2015-07-12
  Administered 2015-07-12: 650 mg via RECTAL
  Filled 2015-07-12: qty 1

## 2015-07-12 MED ORDER — PROCHLORPERAZINE MALEATE 10 MG PO TABS
10.0000 mg | ORAL_TABLET | Freq: Four times a day (QID) | ORAL | Status: DC | PRN
  Filled 2015-07-12: qty 1

## 2015-07-12 MED ORDER — METHYLPREDNISOLONE SODIUM SUCC 125 MG IJ SOLR
60.0000 mg | Freq: Two times a day (BID) | INTRAMUSCULAR | Status: DC
  Administered 2015-07-12 – 2015-07-14 (×4): 60 mg via INTRAVENOUS
  Filled 2015-07-12 (×4): qty 2

## 2015-07-12 MED ORDER — FERROUS SULFATE 325 (65 FE) MG PO TABS
325.0000 mg | ORAL_TABLET | Freq: Every day | ORAL | Status: DC
  Administered 2015-07-13 – 2015-07-14 (×2): 325 mg via ORAL
  Filled 2015-07-12 (×3): qty 1

## 2015-07-12 MED ORDER — METHYLPREDNISOLONE SODIUM SUCC 125 MG IJ SOLR: 60.0000 mg | Freq: Two times a day (BID) | INTRAMUSCULAR | Status: DC

## 2015-07-12 MED ORDER — IPRATROPIUM BROMIDE 0.02 % IN SOLN
0.5000 mg | Freq: Four times a day (QID) | RESPIRATORY_TRACT | Status: DC
  Administered 2015-07-12 – 2015-07-13 (×2): 0.5 mg via RESPIRATORY_TRACT
  Filled 2015-07-12 (×2): qty 2.5

## 2015-07-12 MED ORDER — FAMOTIDINE 20 MG PO TABS
20.0000 mg | ORAL_TABLET | Freq: Every day | ORAL | Status: DC
  Administered 2015-07-12 – 2015-07-13 (×2): 20 mg via ORAL
  Filled 2015-07-12 (×2): qty 1

## 2015-07-12 MED ORDER — TAMSULOSIN HCL 0.4 MG PO CAPS
0.4000 mg | ORAL_CAPSULE | Freq: Every day | ORAL | Status: DC
  Administered 2015-07-12 – 2015-07-13 (×2): 0.4 mg via ORAL
  Filled 2015-07-12 (×2): qty 1

## 2015-07-12 MED ORDER — DEXTROSE 5 % IV SOLN
1.0000 g | Freq: Three times a day (TID) | INTRAVENOUS | Status: DC
  Administered 2015-07-13 – 2015-07-14 (×4): 1 g via INTRAVENOUS
  Filled 2015-07-12 (×6): qty 1

## 2015-07-12 MED ORDER — FLUTICASONE PROPIONATE 50 MCG/ACT NA SUSP
1.0000 | Freq: Every day | NASAL | Status: DC
  Administered 2015-07-12 – 2015-07-14 (×3): 1 via NASAL
  Filled 2015-07-12: qty 16

## 2015-07-12 MED ORDER — CHLORHEXIDINE GLUCONATE 0.12 % MT SOLN
15.0000 mL | Freq: Two times a day (BID) | OROMUCOSAL | Status: DC
  Administered 2015-07-12 – 2015-07-14 (×4): 15 mL via OROMUCOSAL
  Filled 2015-07-12 (×4): qty 15

## 2015-07-12 MED ORDER — LORAZEPAM 2 MG/ML IJ SOLN
0.5000 mg | Freq: Once | INTRAMUSCULAR | Status: AC
  Administered 2015-07-12: 0.5 mg via INTRAVENOUS
  Filled 2015-07-12: qty 1

## 2015-07-12 MED ORDER — SENNOSIDES 8.6 MG PO TABS: 1.0000 | ORAL_TABLET | Freq: Every evening | ORAL | Status: DC | PRN

## 2015-07-12 MED ORDER — FOLIC ACID 1 MG PO TABS
1.0000 mg | ORAL_TABLET | Freq: Every day | ORAL | Status: DC
  Administered 2015-07-13 – 2015-07-14 (×2): 1 mg via ORAL
  Filled 2015-07-12 (×2): qty 1

## 2015-07-12 MED ORDER — ALBUTEROL SULFATE (2.5 MG/3ML) 0.083% IN NEBU
5.0000 mg | INHALATION_SOLUTION | Freq: Once | RESPIRATORY_TRACT | Status: AC
  Administered 2015-07-12: 5 mg via RESPIRATORY_TRACT
  Filled 2015-07-12: qty 6

## 2015-07-12 MED ORDER — ENOXAPARIN SODIUM 120 MG/0.8ML ~~LOC~~ SOLN: 120.0000 mg | SUBCUTANEOUS | Status: DC

## 2015-07-12 MED ORDER — LEVOTHYROXINE SODIUM 50 MCG PO TABS
50.0000 ug | ORAL_TABLET | Freq: Every day | ORAL | Status: DC
  Administered 2015-07-13 – 2015-07-14 (×2): 50 ug via ORAL
  Filled 2015-07-12 (×3): qty 1

## 2015-07-12 MED ORDER — GUAIFENESIN ER 600 MG PO TB12: 600.0000 mg | ORAL_TABLET | Freq: Two times a day (BID) | ORAL | Status: DC

## 2015-07-12 NOTE — H&P (Addendum)
History and Physical:    Nicholas Schmitt   LKG:[id number] DOB: 1935-12-01 DOA: 07/12/2015  Referring MD/provider: Dr. Lonie Peak PCP: [name], MD   Chief Complaint: Fever, shortness of breath  History of Present Illness:   Nicholas Schmitt is an 80 y.o. male with a PMH of DVT on chronic anticoagulation with Lovenox, chronic respiratory failure on home oxygen therapy, stage IV lung cancer under the care of Dr. Julien Nordmann, status post chemotherapy as well as palliative radiotherapy to the right lung and lumbar spine, status post craniotomy with tumor excision, whose last 2 chemotherapy cycles had to be held due to persistent thrombocytopenia, with lung cancer complicated by malignant pleural effusion requiring thoracentesis, who was sent to the ED from SNF for evaluation of fever, chills and progressive dyspnea.  Has had the flu vaccine as well as the pneumonia vaccine but is a resident of a skilled nursing facility with sick contacts. The patient is currently wearing BiPAP and is unable to answer my questions in detail due to his significant dyspnea and respiratory distress. The fever appears to have begun within the past 24 hours with a deterioration in his respiratory status progressing to the acute onset of respiratory distress and wheezing. He also complains of hemorrhoidal bleeding. He was placed on BiPAP on admission.  ROS:   Review of Systems  Unable to perform ROS: acuity of condition   Past Medical History:   Past Medical History  Diagnosis Date  . CAD (coronary artery disease)     positive stress test in 2006 led to left heart cath (8/06) showing 95% prox RCA, 90% CFX, and 90% mLAD.  patient had a cypher DES to all 3 lesions  . Hyperlipidemia   . Hearing loss   . AAA (abdominal aortic aneurysm) ([place]) 11/09    3.2 cm   . Chronic renal insufficiency   . History of radiation therapy 03/08/04- 04/08/04    prostate 4600 cGy in 3 fractions, radioactive seed implant  05/04/04  . Myocardial infarction ([place]) 1995  . Pacemaker 2014  . Shortness of breath     exertion  . COPD (chronic obstructive pulmonary disease) ([place])   . Radiation Jan.11-Jan 29/2016    Right central chest 35 gray in 14 fractions  . Radiation Jan.2016    35 gray in 14 fx lower lumbar/upper sacrum  . Diverticulosis   . Hemorrhoids   . Pneumonia     hx  . GERD (gastroesophageal reflux disease)   . Hypertension     no med now for 3-4 months  . Prostate cancer ([place]) 2005    gleason 7  . Skin cancer     ear  . Lung cancer ([place])   . Cancer [facility]) 2015    prostate   . Cancer [facility]) December 04, 2014    Cranial   . Cancer [facility]) 04/01/2015    spine  . Acute pulmonary embolism [facility])     Past Surgical History:   Past Surgical History  Procedure Laterality Date  . Ptca  2006  . Coronary stent placement    . Permanent pacemaker insertion Bilateral 02/26/13    MDT Adapta L implanted by Dr Rayann Heman for sick sinus syndrome  . Radioactive seed implant  05/04/2004    8000 cGy, Dr Danny Lawless Dr Reece Agar  . Elbow surgery Bilateral 1973  . Coronary angioplasty    . Mediastinoscopy N/A 01/28/2014    Procedure: MEDIASTINOSCOPY;  Surgeon: Melrose Nakayama, MD;  Location: [facility];  Service: Thoracic;  Laterality: N/A;  . Permanent pacemaker insertion N/A 02/26/2013    Procedure: PERMANENT PACEMAKER INSERTION;  Surgeon: [name], MD;  Location: [place] CATH LAB;  Service: Cardiovascular;  Laterality: N/A;  . Insert / replace / remove pacemaker      not removed  . Craniotomy N/A 12/04/2014    Procedure: CRANIOTOMY TUMOR EXCISION, LEFT;  Surgeon: [name], MD;  Location: [place] NEURO ORS;  Service: Neurosurgery;  Laterality: N/A;  CRANIOTOMY TUMOR EXCISION  . Application of cranial navigation  12/04/2014    Procedure: APPLICATION OF CRANIAL NAVIGATION;  Surgeon: [name], MD;  Location: [place] ORS;  Service: Neurosurgery;;  . Darden Dates without cardioversion N/A 05/06/2015    Procedure:  TRANSESOPHAGEAL ECHOCARDIOGRAM (TEE);  Surgeon: [name], MD;  Location: [facility] ENDOSCOPY;  Service: Cardiovascular;  Laterality: N/A;    Social History:   Social History   Social History  . Marital Status: Divorced    Spouse Name: N/A  . Number of Children: 3  . Years of Education: N/A   Occupational History  . Retired    Social History Main Topics  . Smoking status: Former Smoker -- 3.00 packs/day for 35 years    Types: Cigarettes    Quit date: 07/30/1993  . Smokeless tobacco: Never Used  . Alcohol Use: No     Comment: hx alcohol abuse per note dated 04/11/11  . Drug Use: No  . Sexual Activity: No   Other Topics Concern  . Not on file   Social History Narrative   Divorced, retired, has 3 children.  Has been at [facility] for the last 2 months.      Family history:   Family History  Problem Relation Age of Onset  . Asthma Sister   . Asthma Brother   . Coronary artery disease Other     Allergies   Review of patient's allergies indicates no known allergies.  Current Medications:   Prior to Admission medications   Medication Sig Start Date End Date Taking? Authorizing Provider  albuterol (PROVENTIL HFA;VENTOLIN HFA) 108 (90 BASE) MCG/ACT inhaler Inhale into the lungs every 6 (six) hours as needed for wheezing or shortness of breath.    Historical Provider, MD  ALPRAZolam [name]) 0.25 MG tablet Take 1 tablet (0.25 mg total) by mouth 2 (two) times daily as needed for anxiety. 05/09/15   [name], MD  benzonatate (TESSALON) 100 MG capsule Take 1 capsule (100 mg total) by mouth 3 (three) times daily as needed for cough. 05/26/15   [name], MD  budesonide-formoterol (SYMBICORT) 160-4.5 MCG/ACT inhaler Inhale 2 puffs into the lungs 2 (two) times daily. 07/25/14   [name], MD  dexamethasone (DECADRON) 4 MG tablet Take 4 mg by mouth 2 (two) times daily.     Historical Provider, MD  docusate sodium (COLACE) 100 MG capsule Take 100 mg by mouth at  bedtime as needed for mild constipation.    Historical Provider, MD  enoxaparin (LOVENOX) 120 MG/0.8ML injection Inject 120 mg into the skin daily.    Historical Provider, MD  enoxaparin (LOVENOX) 80 MG/0.8ML injection Inject 0.7 mLs (70 mg total) into the skin every 12 (twelve) hours. Patient not taking: Reported on 07/08/2015 05/27/15   [name], MD  famotidine (PEPCID) 20 MG tablet Take 20 mg by mouth at bedtime.    Historical Provider, MD  ferrous sulfate 325 (65 FE) MG tablet Take 1 tablet (325 mg total) by mouth daily with breakfast. 05/27/15  [name], MD  fluticasone (FLONASE) 50 MCG/ACT nasal spray USE 2 SPRAYS IN [facility] NOSTRIL AT NIGHT 03/10/15   Historical Provider, MD  folic acid (FOLVITE) 1 MG tablet Take 1 tablet (1 mg total) by mouth daily. 05/27/15   [name], MD  gabapentin (NEURONTIN) 100 MG capsule TAKE 1 CAPS 3 TIMES DAY X5DAY, 2 CAPS 3 TIMES DAY X5DAYS, 3 CAPS 3 TIMES DAY TIL DIRECTED OTHERWISE Patient not taking: Reported on 07/08/2015 06/09/15   [name], MD  guaiFENesin (MUCINEX) 600 MG 12 hr tablet Take 1 tablet (600 mg total) by mouth 2 (two) times daily. 05/26/15   [name], MD  lactulose (CHRONULAC) 10 GM/15ML solution Take 15 mLs by mouth 2 (two) times daily as needed. constipaton 04/06/15   Historical Provider, MD  levofloxacin (LEVAQUIN) 750 MG tablet Take 1 tablet (750 mg total) by mouth daily. 05/26/15   [name], MD  levothyroxine (SYNTHROID) 50 MCG tablet Take 1 tablet (50 mcg total) by mouth daily before breakfast. 05/12/15   Curt Bears, MD  mirtazapine (REMERON) 30 MG tablet Take 1 tablet (30 mg total) by mouth at bedtime. 03/31/15   Curt Bears, MD  Multiple Vitamins-Minerals (THERA-M) TABS Take 1 tablet by mouth daily.    Historical Provider, MD  nitroGLYCERIN (NITROSTAT) 0.4 MG SL tablet Place 0.4 mg under the tongue every 5 (five) minutes as needed for chest pain (MAX 3 TABLETS). 06/05/13   Dorena Cookey, MD    oxyCODONE (OXY IR/ROXICODONE) 5 MG immediate release tablet Take 1 tablet (5 mg total) by mouth every 6 (six) hours as needed for severe pain. 05/09/15   [name], MD  prochlorperazine (COMPAZINE) 10 MG tablet Take 10 mg by mouth every 6 (six) hours as needed for nausea or vomiting.    Historical Provider, MD  senna (SENOKOT) 8.6 MG tablet Take 1 tablet by mouth at bedtime as needed for constipation.    Historical Provider, MD  tamsulosin (FLOMAX) 0.4 MG CAPS capsule Take 0.4 mg by mouth at bedtime.  10/09/14   Historical Provider, MD    Physical Exam:   Filed Vitals:   07/12/15 1435 07/12/15 1503 07/12/15 1549 07/12/15 1604  BP: 116/82  98/68 97/58  Pulse: 123  83 92  Temp: 102.5 F (39.2 C)   101.8 F (38.8 C)  TempSrc: Core (Comment)  Core (Comment) Core (Comment)  Resp: 34  34 35  Weight:  83.915 kg (185 lb)    SpO2: 99%   100%     Physical Exam: Blood pressure 97/58, pulse 92, temperature 101.8 F (38.8 C), temperature source Core (Comment), resp. rate 35, weight 83.915 kg (185 lb), SpO2 100 %. Gen: Morbidly obese male in moderate respiratory distress with BiPAP mask over his mouth and nose. Head: Normocephalic, atraumatic. Dried blood around nares. Eyes: PERRL, EOMI, sclerae nonicteric. Mouth: Oropharynx mildly dry mucous membranes. Neck: Supple, no thyromegaly, no lymphadenopathy, no jugular venous distention. Chest: Lungs rhonchi and increased WOB/tachypnea.  CV: Heart sounds are tachycardic with a grade 2/6 systolic ejection murmur. Abdomen: Soft, nontender, nondistended with normal active bowel sounds. Extremities: RLE with asymmetric edema compared to left. Skin: Warm and dry. Neuro: Alert and oriented 2; grossly nonfocal. Psych: Mood and affect anxious.   Data Review:    Labs: Basic Metabolic Panel:  Recent Labs Lab 07/08/15 0401 07/12/15 1318  NA 141 140  K 4.4 4.2  CL 105 101  CO2 25 23  GLUCOSE 114* 118*  BUN  34* 27*  CREATININE 0.70 0.67   CALCIUM 9.6 10.0   Liver Function Tests:  Recent Labs Lab 07/12/15 1318  AST 20  ALT 19  ALKPHOS 76  BILITOT 2.0*  PROT 7.2  ALBUMIN 3.9   No results for input(s): LIPASE, AMYLASE in the last 168 hours. No results for input(s): AMMONIA in the last 168 hours. CBC:  Recent Labs Lab 07/08/15 0401 07/12/15 1318  WBC 5.9 9.3  NEUTROABS 4.8 6.9  HGB 10.3* 10.4*  HCT 33.1* 33.9*  MCV 99.1 99.7  PLT 23* 43*    Radiographic Studies: Dg Chest 1 View  07/12/2015  CLINICAL DATA:  Sudden onset shortness of breath with decreased breath sounds. EXAM: CHEST 1 VIEW COMPARISON:  05/25/2015. FINDINGS: 1303 hours. Stable asymmetric lesions of right hemidiaphragm. Diffuse asymmetric interstitial and patchy airspace opacity is seen bilaterally. The cardio pericardial silhouette is enlarged. The visualized bony structures of the thorax are intact. Telemetry leads overlie the chest. IMPRESSION: Interstitial and patchy bilateral airspace disease. Imaging features suggest pulmonary edema or asymmetric infection. Electronically Signed   By: Misty Stanley M.D.   On: 07/12/2015 13:23   *I have personally reviewed the images above*  EKG: Independently reviewed. Sinus tachycardia at 134 bpm with a right bundle branch block and Q waves in the inferior leads. Known pacemaker.   Assessment/Plan:   Principal Problems:   Sepsis ([place]) - Sepsis order set utilized.  - Meets 2 or more SIRS criteria (Temp > 100.9 <96.8; HR >90; RR >20; PaCO2<32; WBC >12K<4K or >10 %bands AND has evidence of acute organ failure (lactic acidosis, oliguria, ALI, ARDS, coagulopathy/DIC, AMS, hypotension/shock) - This patient is at high risk of poor outcomes with a SOFA score of 2(at least 2 of the following clinical criteria: respiratory rate of 22/min or greater, altered mentation, or systolic blood pressure of 100 mm Hg or less. - Source thought to be from HCAP.  U/A clear, CXR shows patchy bilateral airspace disease. - Send  blood and sputum cultures. Check influenza panel. - WBC is 9.3, lactic acid is 4.39.  Check pro-calcitoninin. - Fluid volume resuscitate with 30 mg/kg using weight based algorithm per sepsis order set. - Start targeted antibiotics with cefepime/vancomycin, based on suspected source of infection.  - Start Tamiflu Tamiflu pending influenza PCR results.      Acute on chronic respiratory failure secondary to healthcare associated pneumonia and COPD exacerbation - Treat with vancomycin and cefepime, with de-escalation and 48-72 hours if indicated. - Check sputum cultures, and guide therapy based on results. Pseudomonas and Staph pneumonias are rare and estimated to be < 3% of all HCAPs  - Duration typically can be limited to 7 days with clinical improvement (14 days reserved for Pseudomonas positive culture on ventilated patients) and rarely requires post-discharge IV administration.  - Non-Fluoroquinolone antibiotics are appropriate for oral continuation (i.e. cefuroxime) in many circumstances. - Reference: "Predictors of Pseudomonas and methicillin-resistant Staphylococcus aureus in hospitalized patients with healthcare-associated pneumonia, Respirology 21, 157-163, 2016.  - We'll treat COPD exacerbation with Solu-Medrol and nebulized bronchodilator therapy. - Continue BiPAP. Wean as tolerated. - Continue Symbicort and Mucinex.  Active Problems:   Essential hypertension - Mildly hypotensive. Not on antihypertensives at baseline.    Non-small cell carcinoma of lung, stage 4 (HCC) with brain metastasis - We'll notify Dr. Julien Nordmann of the patient's admission via [place]. - Chemotherapy currently on hold secondary to persistent thrombocytopenia.    Anemia in neoplastic disease - Continue iron and folic acid. No current  indication for transfusion.    DVT (deep venous thrombosis) (HCC)/recurrent pulmonary embolism status post IVC filter - Hold therapeutic dose Lovenox but watch closely for signs of  bleeding.    Malignant pleural effusion - Has required thoracentesis in the past.    Hypothyroidism - Continue Synthroid.    Hemorrhoids - Proctofoam ordered.    Presence of permanent cardiac pacemaker    Thrombocytopenia (HCC) - High risk for bleeding, hold Lovenox.    DVT prophylaxis - Would avoid SCDs as he has significant asymmetric edema and likely has a right lower extremity DVT. - Unfortunately, Lovenox must be held due to thrombocytopenia and epistaxis. - Patient is status post IVC filter which provide some protection against PE.   Code Status / Family Communication / Disposition Plan:   Code Status: DO NOT RESUSCITATE Family Communication: Multiple family updated at bedside. Disposition Plan: From [facility], can likely return there in several days once sepsis resolves and respiratory status improves to baseline.  Attestation regarding necessity of inpatient status:   The appropriate admission status for this patient is INPATIENT. Inpatient status is judged to be reasonable and necessary in order to provide the required intensity of service to ensure the patient's safety. The patient's presenting symptoms, physical exam findings, and initial radiographic and laboratory data in the context of their chronic comorbidities is felt to place them at high risk for further clinical deterioration. Furthermore, it is not anticipated that the patient will be medically stable for discharge from the hospital within 2 midnights of admission. The following factors support the admission status of inpatient.   -The patient's presenting symptoms include respiratory distress and fever. - The worrisome physical exam findings include tachypnea, tachycardia, fever, asymmetric edema. - The initial radiographic and laboratory data are worrisome because of bilateral lung infiltrates, thrombocytopenia. - The chronic co-morbidities include stage IV lung cancer, COPD Gold, morbid obesity. -  Patient requires inpatient status due to high intensity of service, high risk for further deterioration and high frequency of surveillance required. - I certify that at the point of admission it is my clinical judgment that the patient will require inpatient hospital care spanning beyond 2 midnights from the point of admission.   Time spent: 75 minutes.  RAMA,CHRISTINA [facility] Pager [phone] Cell: [phone]   If 7PM-7AM, please contact night-coverage [email or web address] Password [facility] 07/12/2015, 4:13 PM

## 2015-07-12 NOTE — ED Notes (Signed)
He remains on bi pap in no distress, but he is a bit anxious.

## 2015-07-12 NOTE — ED Notes (Signed)
RT will be here momentarily for transport to step down

## 2015-07-12 NOTE — Progress Notes (Signed)
ANTIBIOTIC CONSULT NOTE - INITIAL  Pharmacy Consult for Cefepime/Vancomycin Indication: HCAP  No Known Allergies  Patient Measurements: Weight: 185 lb (83.915 kg)   Vital Signs: Temp: 102.5 F (39.2 C) (01/29 1435) Temp Source: Core (Comment) (01/29 1435) BP: 116/82 mmHg (01/29 1435) Pulse Rate: 123 (01/29 1435) Intake/Output from previous day:   Intake/Output from this shift:    Labs:  Recent Labs  07/12/15 1318  WBC 9.3  HGB 10.4*  PLT 43*  CREATININE 0.67   Estimated Creatinine Clearance: 84.9 mL/min (by C-G formula based on Cr of 0.67). No results for input(s): VANCOTROUGH, VANCOPEAK, VANCORANDOM, GENTTROUGH, GENTPEAK, GENTRANDOM, TOBRATROUGH, TOBRAPEAK, TOBRARND, AMIKACINPEAK, AMIKACINTROU, AMIKACIN in the last 72 hours.   Microbiology: Recent Results (from the past 720 hour(s))  TECHNOLOGIST REVIEW     Status: None   Collection Time: 06/23/15 10:46 AM  Result Value Ref Range Status   Technologist Review Oc meta, Oc NRBC  Final    Medical History: Past Medical History  Diagnosis Date  . CAD (coronary artery disease)     positive stress test in 2006 led to left heart cath (8/06) showing 95% prox RCA, 90% CFX, and 90% mLAD.  patient had a cypher DES to all 3 lesions  . Hyperlipidemia   . Hearing loss   . AAA (abdominal aortic aneurysm) ([place]) 11/09    3.2 cm   . Chronic renal insufficiency   . History of radiation therapy 03/08/04- 04/08/04    prostate 4600 cGy in 3 fractions, radioactive seed implant 05/04/04  . Myocardial infarction ([place]) 1995  . Pacemaker 2014  . Shortness of breath     exertion  . COPD (chronic obstructive pulmonary disease) ([place])   . Radiation Jan.11-Jan 29/2016    Right central chest 35 gray in 14 fractions  . Radiation Jan.2016    35 gray in 14 fx lower lumbar/upper sacrum  . Diverticulosis   . Hemorrhoids   . Pneumonia     hx  . GERD (gastroesophageal reflux disease)   . Hypertension     no med now for 3-4 months  .  Prostate cancer ([place]) 2005    gleason 7  . Skin cancer     ear  . Lung cancer ([place])   . Cancer [facility]) 2015    prostate   . Cancer [facility]) December 04, 2014    Cranial   . Cancer [facility]) 04/01/2015    spine    Medications:  Scheduled:  . budesonide-formoterol  2 puff Inhalation BID  . enoxaparin  120 mg Subcutaneous Q24H  . famotidine  20 mg Oral QHS  . [START ON 07/13/2015] ferrous sulfate  325 mg Oral Q breakfast  . fluticasone  1 spray Each Nare Daily  . folic acid  1 mg Oral Daily  . guaiFENesin  600 mg Oral BID  . hydrocortisone  25 mg Rectal BID  . hydrocortisone-pramoxine  1 applicator Rectal BID  . hydrocortisone-pramoxine  1 applicator Rectal BID  . ipratropium  0.5 mg Nebulization Q6H  . levalbuterol  0.63 mg Nebulization Q6H  . [START ON 07/13/2015] levothyroxine  50 mcg Oral QAC breakfast  . methylPREDNISolone (SOLU-MEDROL) injection  60 mg Intravenous Q12H  . mirtazapine  30 mg Oral QHS  . oseltamivir  75 mg Oral BID  . tamsulosin  0.4 mg Oral QHS  . THERA-M  1 tablet Oral Daily   Infusions:  . sodium chloride    . ceFEPime (MAXIPIME) IV    . sodium chloride  Assessment: 17 yoM c/o sudden onset of SOB.  Cefepime and Vancomycin per Rx for HCAP.  Goal of Therapy:  Vancomycin trough level 15-20 mcg/ml  Plan:   Cefepime 2Gm x1 then 1Gm IV q8h  Vancomycin 1Gm IV q12h  F/u SCr/cultures/levels as needed  Lawana Pai R 07/12/2015,3:51 PM

## 2015-07-12 NOTE — ED Notes (Signed)
I have just arrived at his room from being otherwise occupied to find him in no distress on bi-pap under the care of our C.N., Patty.  He has two peripheral IV's and a temp.-sensor foley draining clear amber urine.  He has three family members at bedside.

## 2015-07-12 NOTE — ED Notes (Signed)
Fluid bolus just started in the presence of Dr. Rockne Menghini, who is examining pt. At this time. Pt. Wt. In Kg.: 84; therefore our target is 2582m.

## 2015-07-12 NOTE — ED Notes (Signed)
Bed: PZ02 Expected date: 07/12/15 Expected time: 12:53 PM Means of arrival: Ambulance Comments: Diff breathing, breathing treatment in process

## 2015-07-12 NOTE — ED Notes (Signed)
R.T. Notified of impending albuterol--will be here shortly.  Orders rec'd. From Dr. Johnney Killian for additional meds.  Pt. Is calmer since receiving Ativan IV.

## 2015-07-12 NOTE — ED Notes (Signed)
Pt resting easier at present time, continues to have CPAP in place, skin dry with multiple bruises especially on abd, pt has pitting edema in bil feet, producing dark yellow urine, family at bedside offering support. 2nd IV established.

## 2015-07-12 NOTE — ED Notes (Signed)
Pt arrived via EMS with report of sudden onset of SOB, rhonci in BUL and diminish breath sounds in bil lower lobes and wheezing. Pt was given Albuterol neb '5mg'$  enroute. Pt noted using accessory muscles to assist with breathing. Color cynaosis.

## 2015-07-13 ENCOUNTER — Telehealth: Payer: Self-pay | Admitting: *Deleted

## 2015-07-13 DIAGNOSIS — R05 Cough: Secondary | ICD-10-CM

## 2015-07-13 DIAGNOSIS — D630 Anemia in neoplastic disease: Secondary | ICD-10-CM

## 2015-07-13 DIAGNOSIS — C3490 Malignant neoplasm of unspecified part of unspecified bronchus or lung: Secondary | ICD-10-CM

## 2015-07-13 DIAGNOSIS — D696 Thrombocytopenia, unspecified: Secondary | ICD-10-CM

## 2015-07-13 DIAGNOSIS — J449 Chronic obstructive pulmonary disease, unspecified: Secondary | ICD-10-CM

## 2015-07-13 DIAGNOSIS — I82401 Acute embolism and thrombosis of unspecified deep veins of right lower extremity: Secondary | ICD-10-CM

## 2015-07-13 DIAGNOSIS — Z86711 Personal history of pulmonary embolism: Secondary | ICD-10-CM

## 2015-07-13 DIAGNOSIS — Z950 Presence of cardiac pacemaker: Secondary | ICD-10-CM

## 2015-07-13 DIAGNOSIS — R0602 Shortness of breath: Secondary | ICD-10-CM

## 2015-07-13 DIAGNOSIS — A419 Sepsis, unspecified organism: Principal | ICD-10-CM

## 2015-07-13 DIAGNOSIS — Z95828 Presence of other vascular implants and grafts: Secondary | ICD-10-CM

## 2015-07-13 DIAGNOSIS — J9621 Acute and chronic respiratory failure with hypoxia: Secondary | ICD-10-CM

## 2015-07-13 LAB — GLUCOSE, CAPILLARY
GLUCOSE-CAPILLARY: 233 mg/dL — AB (ref 65–99)
Glucose-Capillary: 107 mg/dL — ABNORMAL HIGH (ref 65–99)
Glucose-Capillary: 220 mg/dL — ABNORMAL HIGH (ref 65–99)

## 2015-07-13 LAB — EXPECTORATED SPUTUM ASSESSMENT W GRAM STAIN, RFLX TO RESP C

## 2015-07-13 LAB — STREP PNEUMONIAE URINARY ANTIGEN: Strep Pneumo Urinary Antigen: NEGATIVE

## 2015-07-13 LAB — EXPECTORATED SPUTUM ASSESSMENT W REFEX TO RESP CULTURE

## 2015-07-13 MED ORDER — IPRATROPIUM-ALBUTEROL 0.5-2.5 (3) MG/3ML IN SOLN
3.0000 mL | Freq: Three times a day (TID) | RESPIRATORY_TRACT | Status: DC
  Administered 2015-07-13 – 2015-07-14 (×5): 3 mL via RESPIRATORY_TRACT
  Filled 2015-07-13 (×5): qty 3

## 2015-07-13 NOTE — Telephone Encounter (Signed)
Medical provider had referred pt to hospice but he was transported to [facility] and in ICU. Their Hospcie staff will check on pt.

## 2015-07-13 NOTE — Evaluation (Signed)
Clinical/Bedside Swallow Evaluation Patient Details  Name: Nicholas Schmitt MRN: [id number] Date of Birth: [birth year]  Today's Date: 07/13/2015 Time: SLP Start Time (ACUTE ONLY): 4627 SLP Stop Time (ACUTE ONLY): 1620 SLP Time Calculation (min) (ACUTE ONLY): 10 min  Past Medical History:  Past Medical History  Diagnosis Date  . CAD (coronary artery disease)     positive stress test in 2006 led to left heart cath (8/06) showing 95% prox RCA, 90% CFX, and 90% mLAD.  patient had a cypher DES to all 3 lesions  . Hyperlipidemia   . Hearing loss   . AAA (abdominal aortic aneurysm) ([place]) 11/09    3.2 cm   . Chronic renal insufficiency   . History of radiation therapy 03/08/04- 04/08/04    prostate 4600 cGy in 3 fractions, radioactive seed implant 05/04/04  . Myocardial infarction ([place]) 1995  . Pacemaker 2014  . Shortness of breath     exertion  . COPD (chronic obstructive pulmonary disease) ([place])   . Radiation Jan.11-Jan 29/2016    Right central chest 35 gray in 14 fractions  . Radiation Jan.2016    35 gray in 14 fx lower lumbar/upper sacrum  . Diverticulosis   . Hemorrhoids   . Pneumonia     hx  . GERD (gastroesophageal reflux disease)   . Hypertension     no med now for 3-4 months  . Prostate cancer ([place]) 2005    gleason 7  . Skin cancer     ear  . Lung cancer ([place])   . Cancer [facility]) 2015    prostate   . Cancer [facility]) December 04, 2014    Cranial   . Cancer [facility]) 04/01/2015    spine  . Acute pulmonary embolism [facility])    Past Surgical History:  Past Surgical History  Procedure Laterality Date  . Ptca  2006  . Coronary stent placement    . Permanent pacemaker insertion Bilateral 02/26/13    MDT Adapta L implanted by Dr Rayann Heman for sick sinus syndrome  . Radioactive seed implant  05/04/2004    8000 cGy, Dr Danny Lawless Dr Reece Agar  . Elbow surgery Bilateral 1973  . Coronary angioplasty    . Mediastinoscopy N/A 01/28/2014    Procedure: MEDIASTINOSCOPY;  Surgeon: Melrose Nakayama, MD;  Location: [place];  Service: Thoracic;  Laterality: N/A;  . Permanent pacemaker insertion N/A 02/26/2013    Procedure: PERMANENT PACEMAKER INSERTION;  Surgeon: [name], MD;  Location: [place] CATH LAB;  Service: Cardiovascular;  Laterality: N/A;  . Insert / replace / remove pacemaker      not removed  . Craniotomy N/A 12/04/2014    Procedure: CRANIOTOMY TUMOR EXCISION, LEFT;  Surgeon: [name], MD;  Location: [place] NEURO ORS;  Service: Neurosurgery;  Laterality: N/A;  CRANIOTOMY TUMOR EXCISION  . Application of cranial navigation  12/04/2014    Procedure: APPLICATION OF CRANIAL NAVIGATION;  Surgeon: [name], MD;  Location: [place] ORS;  Service: Neurosurgery;;  . [place] Dates without cardioversion N/A 05/06/2015    Procedure: TRANSESOPHAGEAL ECHOCARDIOGRAM (TEE);  Surgeon: [name], MD;  Location: [facility] ENDOSCOPY;  Service: Cardiovascular;  Laterality: N/A;   HPI:  80 y.o. male with PMH of DVT on chronic anticoagulation with Lovenox, chronic respiratory failure on home oxygen therapy, stage IV lung cancer under the care of Dr. Julien Nordmann, status post chemotherapy as well as palliative radiotherapy to the right lung and lumbar spine, status post craniotomy with tumor excision 12/04/14, whose last 2 chemotherapy cycles  had to be held due to persistent thrombocytopenia, with lung cancer complicated by malignant pleural effusion requiring thoracentesis, who was sent to the ED from SNF for evaluation of fever, chills and progressive dyspnea.  Dx acute-on-chronic respiratory faulure secondary to HCAP and COPD exacerbation.   Assessment / Plan / Recommendation Clinical Impression  Pt presents with functional oropharyngeal swallow, despite increased WOB, with adequate mastication, brisk swallow response, and no overt s/s suggesting aspiration.  Recommend resuming a regular diet, thin liquids.  Give meds whole in puree if liquids elicit cough.  SLP to sign off.     Aspiration Risk  No limitations     Diet Recommendation   regular diet, thin liquids  Medication Administration: Whole meds with liquid    Other  Recommendations Oral Care Recommendations: Oral care BID   Follow up Recommendations  None    Frequency and Duration            Prognosis        Swallow Study   General HPI: 80 y.o. male with PMH of DVT on chronic anticoagulation with Lovenox, chronic respiratory failure on home oxygen therapy, stage IV lung cancer under the care of Dr. Julien Nordmann, status post chemotherapy as well as palliative radiotherapy to the right lung and lumbar spine, status post craniotomy with tumor excision 12/04/14, whose last 2 chemotherapy cycles had to be held due to persistent thrombocytopenia, with lung cancer complicated by malignant pleural effusion requiring thoracentesis, who was sent to the ED from SNF for evaluation of fever, chills and progressive dyspnea.  Dx acute-on-chronic respiratory faulure secondary to HCAP and COPD exacerbation. Type of Study: Bedside Swallow Evaluation Previous Swallow Assessment: no Diet Prior to this Study: Thin liquids Temperature Spikes Noted: No Respiratory Status: Nasal cannula History of Recent Intubation: No Behavior/Cognition: Alert;Cooperative;Pleasant mood Oral Cavity Assessment: Within Functional Limits Oral Care Completed by SLP: No Oral Cavity - Dentition: Dentures, top;Dentures, bottom Vision: Functional for self-feeding Self-Feeding Abilities: Able to feed self Patient Positioning: Upright in bed Baseline Vocal Quality: Normal Volitional Cough: Strong Volitional Swallow: Able to elicit    Oral/Motor/Sensory Function Overall Oral Motor/Sensory Function: Within functional limits   Ice Chips Ice chips: Within functional limits   Thin Liquid Thin Liquid: Within functional limits Presentation: Cup;Self Fed;Straw    Nectar Thick Nectar Thick Liquid: Not tested   Honey Thick Honey Thick Liquid: Not tested   Puree Puree: Within functional  limits Presentation: Self Fed   Solid   GO   Solid: Within functional limits Presentation: Camilla. Tivis Ringer, [place] CCC/SLP Pager [phone]  Juan Quam Laurice 07/13/2015,4:24 PM

## 2015-07-13 NOTE — Progress Notes (Signed)
Updated patients son on his progress, and informed when asked that patient was strictly NPO pending possible swallow evaluation. Son questioned if he could have even a little bit, told him no and I re-educated.Went in at a later point and son was giving patient coffee from his own. Educated son and patient about why he was still NPO. Patient had slight cough after sipping, I sat patient upright and he maintained O2 sats. MD to order swallow Evaluation.

## 2015-07-13 NOTE — Progress Notes (Signed)
DIAGNOSIS: Non-small cell carcinoma of lung, stage 4  Primary site: Lung (Right)  Staging method: AJCC 7th Edition  Clinical: Stage IV (T1a, N3, M1b) signed by Curt Bears, MD on 02/01/2014 1:42 PM  Summary: Stage IV (T1a, N3, M1b) Prostate cancer  Primary site: Prostate  Clinical: Stage I (T1c, NX, M0) signed by Wyatt Portela, MD on 08/06/2013 1:59 PM   Summary: Stage I (T1c, NX, M0)  PRIOR THERAPY:  1) Systemic chemotherapy with carboplatin for an AUC of 5 and Alimta 500 mg per meter squared given every 3 weeks. Status post 6 cycles, last dose was given 05/20/2014 discontinued secondary to disease progression. 2) palliative radiotherapy to the right lung and lumbar spines under the care of Dr. Sondra Come. 3) status post craniotomy with tumor excision under the care of Dr. Vertell Limber on 12/04/2014.  CURRENT THERAPY:  1) Immunotherapy with Ketruda (pembrolizumab) 200 KG every 3 weeks. First dose 07/29/2014. He has positive PDL 1 expression (90%). Status post 13 cycles. His treatment has been on hold for the last 2 months secondary to significant weakness and thrombocytopenia.  Subjective: The patient is seen and examined today. His wife and daughter were at the bedside. He was admitted yesterday with shortness of breath and fever with chills. He had significant respiratory distress at the time of the admission. Chest x-ray showed interstitial and patchy bilateral airspace disease suggesting pulmonary edema or asymmetric infection. He was started on treatment with cefepime and vancomycin. He is feeling a little bit better. He is also on high-dose Solu-Medrol. He denied having any current fever or chills. He has no significant nausea or vomiting. He continues to have shortness of breath with mild cough.  Objective: Vital signs in last 24 hours: Temp:  [97.3 F (36.3 C)-100 F (37.8 C)] 98.6 F (37 C) (01/30 1600) Pulse Rate:  [60-96] 85 (01/30 1600) Resp:  [15-34] 31 (01/30 1600) BP:  (95-151)/(58-90) 126/73 mmHg (01/30 1600) SpO2:  [91 %-100 %] 93 % (01/30 1600) FiO2 (%):  [50 %] 50 % (01/29 2028) Weight:  [185 lb 13.6 oz (84.3 kg)] 185 lb 13.6 oz (84.3 kg) (01/29 1700)  Intake/Output from previous day: 01/29 0701 - 01/30 0700 In: 4510 [P.O.:210; I.V.:4000; IV Piggyback:300] Out: 880 [Urine:880] Intake/Output this shift: Total I/O In: 1150 [I.V.:900; IV Piggyback:250] Out: 500 [Urine:500]  General appearance: alert, cooperative, fatigued and mild distress Resp: rales bilaterally and wheezes bilaterally Cardio: regular rate and rhythm, S1, S2 normal, no murmur, click, rub or gallop GI: soft, non-tender; bowel sounds normal; no masses,  no organomegaly Extremities: extremities normal, atraumatic, no cyanosis or edema  Lab Results:   Recent Labs  07/12/15 1318  WBC 9.3  HGB 10.4*  HCT 33.9*  PLT 43*   BMET  Recent Labs  07/12/15 1318  NA 140  K 4.2  CL 101  CO2 23  GLUCOSE 118*  BUN 27*  CREATININE 0.67  CALCIUM 10.0    Studies/Results: Dg Chest 1 View  07/12/2015  CLINICAL DATA:  Sudden onset shortness of breath with decreased breath sounds. EXAM: CHEST 1 VIEW COMPARISON:  05/25/2015. FINDINGS: 1303 hours. Stable asymmetric lesions of right hemidiaphragm. Diffuse asymmetric interstitial and patchy airspace opacity is seen bilaterally. The cardio pericardial silhouette is enlarged. The visualized bony structures of the thorax are intact. Telemetry leads overlie the chest. IMPRESSION: Interstitial and patchy bilateral airspace disease. Imaging features suggest pulmonary edema or asymmetric infection. Electronically Signed   By: Misty Stanley M.D.   On: 07/12/2015 13:23  Medications: I have reviewed the patient's current medications.  CODE STATUS: No CODE BLUE.  Assessment/Plan: 1) metastatic non-small cell lung cancer, adenocarcinoma status post several chemotherapy regimens and the patient was recently treated with Nat Math (pembrolizumab)  status post 13 cycles with stable disease. His treatment has been on hold because of significant thrombocytopenia as well as generalized weakness and fatigue. His condition is very poor and I strongly recommended for the patient to consider palliative care and hospice at this point. He is still very interested in resuming treatment at some point in the future. I explained to the patient that her be happy to consider him for treatment with immunotherapy if he has improvement of his condition and blood count. 2) respiratory distress: Secondary to questionable pneumonia versus pulmonary edema versus COPD exacerbation. I agree with the current plan by continuing treatment with antibiotics with cefepime and vancomycin. The patient may benefit from mild diuresis in addition to the high dose of steroids. 3) history of pulmonary embolism: The patient has been on treatment with subcutaneous Lovenox. His Lovenox was discontinued secondary to significant thrombocytopenia. I would not consider resuming his treatment with Lovenox until his platelets count is over 50,000.  4) CODE STATUS: No CODE BLUE. Thank you for taking good care of Mr. Chrissie Noa, I will continue to follow up the patient with you and assist in his management on as-needed basis.   LOS: 1 day    Tata Timmins K. 07/13/2015

## 2015-07-13 NOTE — Telephone Encounter (Signed)
Patient's son [name] called reporting Patient is "in [facility] step down "ICU room 1228-01.  He spent several hours in the ED.  We almost lost him.  He'd like Dr. Julien Nordmann to come and see him.  Cancel tomorrow's appointment.  His platelets = 40.  He was in a nursing home.  Will you all still treat him.will he need hospice?" Delfin Edis for letting us know of th is admission.  Appointments have been cancelled.  These questions cannot be answered at this time.  Hospital progression will determine next steps on this process.

## 2015-07-13 NOTE — Progress Notes (Signed)
PROGRESS NOTE  Nicholas Schmitt GBT:[id number] DOB: [birth year] DOA: 07/12/2015 PCP: [name], MD  HPI/Recap of past 24 hours:  Off bipap, less cough, son at bedside  Assessment/Plan: Principal Problem:   Sepsis ([place]) Active Problems:   Essential hypertension   COPD GOLD II   Non-small cell carcinoma of lung, stage 4 (HCC)   Anemia in neoplastic disease   DVT (deep venous thrombosis) (HCC)   Malignant pleural effusion   Hypothyroidism   Presence of IVC filter   Presence of permanent cardiac pacemaker   Thrombocytopenia (HCC)   Acute on chronic respiratory failure with hypoxia (HCC)   Hemorrhoids  Sepsis: presented on admission. Better  Acute on chronic respiratory failure: initial required bipap, off bipap on nasal cannula.  HCAP: on vanc/cefepime. Sputum culture pending. mrsa screening negative, likely able to narrow abx if patient continue to improve. Passed swallow eval with regular diet.  COPD exacerbation: on steroids/abx/nebs/mucinex  Stage IV NSMCLC with brain and spine mets, malignant effusion: oncology advised palliative care consult  DVT/PE s/p IVC filter, resume lovenox if plt >50k  Thrombocytopenia: no acute bleed, hold lovenox.  Anemia in neoplastic disease: hgb stable,   Code Status: DNR  Family Communication: patient and son  Disposition Plan: keep in stepdown   Consultants:  Oncology  Palliative care  Procedures:  none  Antibiotics:  vanc/cefepime   Objective: BP 128/64 mmHg  Pulse 60  Temp(Src) 97.5 F (36.4 C) (Core (Comment))  Resp 17  Ht '5\' 6"'$  (1.676 m)  Wt 84.3 kg (185 lb 13.6 oz)  BMI 30.01 kg/m2  SpO2 98%  Intake/Output Summary (Last 24 hours) at 07/13/15 0859 Last data filed at 07/13/15 0800  Gross per 24 hour  Intake   4610 ml  Output    880 ml  Net   3730 ml   Filed Weights   07/12/15 1503 07/12/15 1700  Weight: 83.915 kg (185 lb) 84.3 kg (185 lb 13.6 oz)    Exam:   General:  Frail,  oriented x3  Cardiovascular: RRR  Respiratory: diminished at basis, mild crackle and wheezing, respiratory effort adequate.  Abdomen: Soft/ND/NT, positive BS  Musculoskeletal: +RLE Edema  Neuro: aaox3  Data Reviewed: Basic Metabolic Panel:  Recent Labs Lab 07/08/15 0401 07/12/15 1318  NA 141 140  K 4.4 4.2  CL 105 101  CO2 25 23  GLUCOSE 114* 118*  BUN 34* 27*  CREATININE 0.70 0.67  CALCIUM 9.6 10.0   Liver Function Tests:  Recent Labs Lab 07/12/15 1318  AST 20  ALT 19  ALKPHOS 76  BILITOT 2.0*  PROT 7.2  ALBUMIN 3.9   No results for input(s): LIPASE, AMYLASE in the last 168 hours. No results for input(s): AMMONIA in the last 168 hours. CBC:  Recent Labs Lab 07/08/15 0401 07/12/15 1318  WBC 5.9 9.3  NEUTROABS 4.8 6.9  HGB 10.3* 10.4*  HCT 33.1* 33.9*  MCV 99.1 99.7  PLT 23* 43*   Cardiac Enzymes:   No results for input(s): CKTOTAL, CKMB, CKMBINDEX, TROPONINI in the last 168 hours. BNP (last 3 results)  Recent Labs  07/21/14 1821 05/05/15 0556 05/21/15 1104  BNP 39.7 74.3 79.0    ProBNP (last 3 results) No results for input(s): PROBNP in the last 8760 hours.  CBG:  Recent Labs Lab 07/13/15 0759  GLUCAP 107*    Recent Results (from the past 240 hour(s))  MRSA PCR Screening     Status: None   Collection Time: 07/12/15  4:58 PM  Result Value Ref Range Status   MRSA by PCR NEGATIVE NEGATIVE Final    Comment:        The GeneXpert MRSA Assay (FDA approved for NASAL specimens only), is one component of a comprehensive MRSA colonization surveillance program. It is not intended to diagnose MRSA infection nor to guide or monitor treatment for MRSA infections.      Studies: Dg Chest 1 View  07/12/2015  CLINICAL DATA:  Sudden onset shortness of breath with decreased breath sounds. EXAM: CHEST 1 VIEW COMPARISON:  05/25/2015. FINDINGS: 1303 hours. Stable asymmetric lesions of right hemidiaphragm. Diffuse asymmetric interstitial and  patchy airspace opacity is seen bilaterally. The cardio pericardial silhouette is enlarged. The visualized bony structures of the thorax are intact. Telemetry leads overlie the chest. IMPRESSION: Interstitial and patchy bilateral airspace disease. Imaging features suggest pulmonary edema or asymmetric infection. Electronically Signed   By: Misty Stanley M.D.   On: 07/12/2015 13:23    Scheduled Meds: . antiseptic oral rinse  7 mL Mouth Rinse q12n4p  . budesonide-formoterol  2 puff Inhalation BID  . ceFEPime (MAXIPIME) IV  1 g Intravenous 3 times per day  . chlorhexidine  15 mL Mouth Rinse BID  . famotidine  20 mg Oral QHS  . ferrous sulfate  325 mg Oral Q breakfast  . fluticasone  1 spray Each Nare Daily  . folic acid  1 mg Oral Daily  . guaiFENesin  600 mg Oral BID  . hydrocortisone  25 mg Rectal BID  . hydrocortisone-pramoxine  1 applicator Rectal BID  . ipratropium-albuterol  3 mL Nebulization TID  . levothyroxine  50 mcg Oral QAC breakfast  . methylPREDNISolone (SOLU-MEDROL) injection  60 mg Intravenous Q12H  . mirtazapine  30 mg Oral QHS  . multivitamin with minerals  1 tablet Oral Daily  . oseltamivir  75 mg Oral BID  . tamsulosin  0.4 mg Oral QHS  . vancomycin  1,000 mg Intravenous Q12H    Continuous Infusions: . sodium chloride 1,000 mL (07/13/15 0140)     Time spent: 57mns  Lahoma Constantin MD, PhD  [facility] Pager [phone] If 7PM-7AM, please contact night-coverage at [email or web address], password [email or web address]1/30/2017, 8:59 AM  LOS: 1 day

## 2015-07-13 NOTE — Care Management Note (Signed)
Case Management Note  Patient Details  Name: MARCELLOUS SNARSKI MRN: [id number] Date of Birth: [birth year]  Subjective/Objective:          Sepsis and resp distress          Action/Plan:Date: July 13, 2015 Chart reviewed for concurrent status and case management needs. Will continue to follow patient for changes and needs: Velva Harman, BSN, RN, [place]   [phone]   Expected Discharge Date:   (UNKNOWN)               Expected Discharge Plan:  Home/Self Care  In-House Referral:  NA  Discharge planning Services  CM Consult  Post Acute Care Choice:  NA Choice offered to:  NA  DME Arranged:    DME Agency:     HH Arranged:    [place] Agency:     Status of Service:  Completed, signed off  Medicare Important Message Given:    Date Medicare IM Given:    Medicare IM give by:    Date Additional Medicare IM Given:    Additional Medicare Important Message give by:     If discussed at [place] of Stay Meetings, dates discussed:    Additional Comments:  Leeroy Cha, RN 07/13/2015, 10:12 AM

## 2015-07-13 NOTE — Progress Notes (Signed)
Initial Nutrition Assessment  DOCUMENTATION CODES:   Obesity unspecified  INTERVENTION:   Diet advancement per MD Once diet is advanced past clears, provide Ensure Enlive po BID, each supplement provides 350 kcal and 20 grams of protein RD to continue to monitor for nutritional needs  NUTRITION DIAGNOSIS:   Increased nutrient needs related to cancer and cancer related treatments as evidenced by estimated needs.  GOAL:   Patient will meet greater than or equal to 90% of their needs  MONITOR:   PO intake, Diet advancement, Labs, Weight trends, I & O's  REASON FOR ASSESSMENT:   Malnutrition Screening Tool    ASSESSMENT:   80 y.o. male with a PMH of DVT on chronic anticoagulation with Lovenox, chronic respiratory failure on home oxygen therapy, stage IV lung cancer under the care of Dr. Julien Nordmann, status post chemotherapy as well as palliative radiotherapy to the right lung and lumbar spine, status post craniotomy with tumor excision, whose last 2 chemotherapy cycles had to be held due to persistent thrombocytopenia, with lung cancer complicated by malignant pleural effusion requiring thoracentesis, who was sent to the ED from SNF for evaluation of fever, chills and progressive dyspnea.  Pt in room with family at bedside. Pt is HOH. History was assisted by family member. Pt with his clear liquid tray, states he ate all of his jello, chicken broth and soda. Pt asking for more coffee, RN notified by family member. Pt drinks Ensure supplements at facility. Pt with recent weight gain d/t supplements and maintained appetite. Pt had lost 41 lb since January 2016, but has started to gain weight back since December 2016 (around 24 lb.) RD to order Ensure supplements once diet is advanced past clears.  Mild fat depletion noted in arm region.  Labs reviewed.  Diet Order:  Diet clear liquid Room service appropriate?: Yes; Fluid consistency:: Thin  Skin:  Reviewed, no issues  Last BM:   1/28  Height:   Ht Readings from Last 1 Encounters:  07/12/15 '5\' 6"'$  (1.676 m)    Weight:   Wt Readings from Last 1 Encounters:  07/12/15 185 lb 13.6 oz (84.3 kg)    Ideal Body Weight:  64.5 kg  BMI:  Body mass index is 30.01 kg/(m^2).  Estimated Nutritional Needs:   Kcal:  2100-2300  Protein:  90-100g  Fluid:  2.1L/day  EDUCATION NEEDS:   No education needs identified at this time  Clayton Bibles, MS, RD, LDN Pager: [phone] After Hours Pager: [phone]

## 2015-07-13 NOTE — Progress Notes (Signed)
Pt resting comfortably, no distress noted or voiced by pt.  HR67, rr22, spo2 95% on 4L [place].  Bipap not indicated at this time but remains in room on standby.  RT will continue to monitor pt.

## 2015-07-14 ENCOUNTER — Ambulatory Visit: Payer: Medicare Other

## 2015-07-14 ENCOUNTER — Ambulatory Visit: Payer: Medicare Other | Admitting: Internal Medicine

## 2015-07-14 ENCOUNTER — Encounter: Payer: Medicare Other | Admitting: Nutrition

## 2015-07-14 ENCOUNTER — Other Ambulatory Visit: Payer: Medicare Other

## 2015-07-14 DIAGNOSIS — Z515 Encounter for palliative care: Secondary | ICD-10-CM

## 2015-07-14 DIAGNOSIS — R0600 Dyspnea, unspecified: Secondary | ICD-10-CM

## 2015-07-14 LAB — RESPIRATORY VIRUS PANEL
Adenovirus: NEGATIVE
INFLUENZA A: NEGATIVE
INFLUENZA B 1: NEGATIVE
Metapneumovirus: NEGATIVE
PARAINFLUENZA 1 A: NEGATIVE
PARAINFLUENZA 2 A: NEGATIVE
PARAINFLUENZA 3 A: NEGATIVE
RHINOVIRUS: NEGATIVE
Respiratory Syncytial Virus A: NEGATIVE
Respiratory Syncytial Virus B: NEGATIVE

## 2015-07-14 LAB — BASIC METABOLIC PANEL
Anion gap: 11 (ref 5–15)
BUN: 18 mg/dL (ref 6–20)
CO2: 22 mmol/L (ref 22–32)
CREATININE: 0.53 mg/dL — AB (ref 0.61–1.24)
Calcium: 9.1 mg/dL (ref 8.9–10.3)
Chloride: 105 mmol/L (ref 101–111)
GFR calc Af Amer: >60 mL/min (ref >60)
Glucose, Bld: 158 mg/dL — ABNORMAL HIGH (ref 65–99)
Potassium: 4.4 mmol/L (ref 3.5–5.1)
SODIUM: 138 mmol/L (ref 135–145)

## 2015-07-14 LAB — CBC
HCT: 28.9 % — ABNORMAL LOW (ref 39.0–52.0)
Hemoglobin: 9 g/dL — ABNORMAL LOW (ref 13.0–17.0)
MCH: 30.7 pg (ref 26.0–34.0)
MCHC: 31.1 g/dL (ref 30.0–36.0)
MCV: 98.6 fL (ref 78.0–100.0)
PLATELETS: 39 10*3/uL — AB (ref 150–400)
RBC: 2.93 MIL/uL — ABNORMAL LOW (ref 4.22–5.81)
RDW: 19.5 % — AB (ref 11.5–15.5)
WBC: 7.8 10*3/uL (ref 4.0–10.5)

## 2015-07-14 LAB — MAGNESIUM: MAGNESIUM: 2.2 mg/dL (ref 1.7–2.4)

## 2015-07-14 MED ORDER — OXYCODONE HCL 5 MG PO TABS: 5.0000 mg | ORAL_TABLET | Freq: Four times a day (QID) | ORAL | Status: AC | PRN

## 2015-07-14 MED ORDER — HYDROCORTISONE ACETATE 25 MG RE SUPP: 25.0000 mg | Freq: Two times a day (BID) | RECTAL | Status: AC

## 2015-07-14 MED ORDER — PREDNISONE 10 MG PO TABS: ORAL_TABLET | ORAL | Status: AC

## 2015-07-14 MED ORDER — LORAZEPAM 2 MG/ML IJ SOLN
0.5000 mg | INTRAMUSCULAR | Status: DC | PRN
  Administered 2015-07-14 (×2): 0.5 mg via INTRAVENOUS
  Filled 2015-07-14: qty 1

## 2015-07-14 MED ORDER — AMOXICILLIN-POT CLAVULANATE 875-125 MG PO TABS: 1.0000 | ORAL_TABLET | Freq: Two times a day (BID) | ORAL | Status: DC

## 2015-07-14 MED ORDER — SENNOSIDES-DOCUSATE SODIUM 8.6-50 MG PO TABS: 1.0000 | ORAL_TABLET | Freq: Two times a day (BID) | ORAL | Status: AC

## 2015-07-14 MED ORDER — HYDROCORTISONE ACE-PRAMOXINE 1-1 % RE FOAM: 1.0000 | Freq: Two times a day (BID) | RECTAL | Status: AC

## 2015-07-14 MED ORDER — MORPHINE SULFATE (PF) 2 MG/ML IV SOLN: 1.0000 mg | INTRAVENOUS | Status: DC | PRN

## 2015-07-14 MED ORDER — LORAZEPAM 2 MG/ML IJ SOLN
0.5000 mg | Freq: Four times a day (QID) | INTRAMUSCULAR | Status: DC | PRN
  Filled 2015-07-14: qty 1

## 2015-07-14 NOTE — Consult Note (Signed)
Consultation Note Date: 07/14/2015   Patient Name: Nicholas Schmitt  DOB: Nov 09, 1935  MRN: [id number]  Age / Sex: 80 y.o., male  PCP: Nicholas Cookey, MD Referring Physician: Florencia Reasons, MD  Reason for Consultation: Establishing goals of care, Non pain symptom management, Pain control and Psychosocial/spiritual support    Clinical Assessment/Narrative:   80 y.o. male with a PMH of DVT on chronic anticoagulation with Lovenox, chronic respiratory failure on home oxygen therapy, stage IV lung cancer under the care of Nicholas. Julien Schmitt, status post chemotherapy as well as palliative radiotherapy to the right lung and lumbar spine, status post craniotomy with tumor excision, whose last 2 chemotherapy cycles had to be held due to persistent thrombocytopenia, with lung cancer complicated by malignant pleural effusion requiring thoracentesis, who was sent to the ED from SNF for evaluation of fever, chills and progressive dyspnea.  This NP Nicholas Schmitt reviewed medical records, received report from team, assessed the patient and then meet at the patient's bedside along with his son Nicholas Schmitt  to discuss diagnosis, prognosis, [place], EOL wishes disposition and options.   A detailed discussion was had today regarding advanced directives.  Concepts specific to code status, artifical feeding and hydration, continued IV antibiotics and rehospitalization was had.  The difference between a aggressive medical intervention path  and a palliative comfort care path for this patient at this time was had.  Values and goals of care important to patient and family were attempted to be elicited.  Concept of Hospice and Palliative Care were discussed  Per family patient has had continued physical and functional decline.  Nicholas Schmitt has recommended a more palliative approach always with the option of if he should improve to consider continued treatments.     Pateint and family understand the poor prognosis and unlikelihood that this will happen.  Hope at this time is for comfort and dignity   Natural trajectory and expectations at EOL were discussed.  Questions and concerns addressed.  Family encouraged to call with questions or concerns.  PMT will continue to support holistically.     Contacts/Participants in Discussion: Primary Decision Maker: Patient at this time with support of his family  HCPOA: yes  Son Nicholas Schmitt   SUMMARY OF RECOMMENDATIONS  -comfort, quality and dignity -transition to hospice facility -symptom management to minimize suffering  Code Status/Advance Care Planning:  DNR      Code Status Orders        Start     Ordered   07/12/15 1545  Do not attempt resuscitation (DNR)   Continuous    Question Answer Comment  In the event of cardiac or respiratory ARREST Do not call a "code blue"   In the event of cardiac or respiratory ARREST Do not perform Intubation, CPR, defibrillation or ACLS   In the event of cardiac or respiratory ARREST Use medication by any route, position, wound care, and other measures to relive pain and suffering. May use oxygen, suction and manual treatment of airway obstruction as needed for comfort.      07/12/15 1544    Code Status History    Date Active Date Inactive Code Status Order ID Comments User Context   05/21/2015  4:27 PM 05/27/2015  6:52 PM DNR [id number]  Nicholas Hamman, MD ED   05/06/2015  8:53 AM 05/09/2015  5:34 PM DNR [id number]  Nicholas Bears, MD Inpatient   05/05/2015  7:48 AM 05/06/2015  8:53 AM Full Code [id number]  Nicholas Dumas, MD ED  12/04/2014  2:46 PM 12/06/2014  2:42 PM Full Code [id number]  [name], MD Inpatient   07/21/2014 10:17 PM 07/25/2014  7:45 PM Partial Code [id number]  Ivor Costa, MD Inpatient   02/26/2013  6:19 PM 02/27/2013  3:13 PM Full Code [id number]  Nicholas Grayer, MD Inpatient    Advance Directive Documentation        Most Recent Value   Type of  Advance Directive  Out of facility DNR (pink MOST or yellow form)   Pre-existing out of facility DNR order (yellow form or pink MOST form)  Pink MOST form placed in chart (order not valid for inpatient use)   "MOST" Form in Place?         Symptom Management:    Dyspnea: Morphine 1 mg IV every 3 hrs prn  Anxiety: Ativan 0.5 mg IV every 4 hrs prn  Palliative Prophylaxis:   Aspiration, Bowel Regimen, Delirium Protocol, Frequent Pain Assessment and Turn Reposition  Additional Recommendations (Limitations, Scope, Preferences):  Continue current medical interventions until discharged to hospice facility and then shift to fullcomfort approach, no further diagnostics, no BiPap   Full Comfort Care   Psycho-social/Spiritual:  Support System: Strong Desire for further Chaplaincy support:yes Additional Recommendations: Education on Hospice  Prognosis: < 2 weeks  Discharge Planning: Hospice facility, will write for choice, family has spoken to [facility] already   Chief Complaint/ Primary Diagnoses: Present on Admission:  . Sepsis ([place]) . Acute on chronic respiratory failure with hypoxia ([place]) . Anemia in neoplastic disease . COPD GOLD II . DVT (deep venous thrombosis) ([place]) . Essential hypertension . Hypothyroidism . Malignant pleural effusion . Non-small cell carcinoma of lung, stage 4 ([place]) . Presence of permanent cardiac pacemaker . Thrombocytopenia ([place]) . Hemorrhoids  I have reviewed the medical record, interviewed the patient and family, and examined the patient. The following aspects are pertinent.  Past Medical History  Diagnosis Date  . CAD (coronary artery disease)     positive stress test in 2006 led to left heart cath (8/06) showing 95% prox RCA, 90% CFX, and 90% mLAD.  patient had a cypher DES to all 3 lesions  . Hyperlipidemia   . Hearing loss   . AAA (abdominal aortic aneurysm) ([place]) 11/09    3.2 cm   . Chronic renal insufficiency   . History  of radiation therapy 03/08/04- 04/08/04    prostate 4600 cGy in 3 fractions, radioactive seed implant 05/04/04  . Myocardial infarction ([place]) 1995  . Pacemaker 2014  . Shortness of breath     exertion  . COPD (chronic obstructive pulmonary disease) ([place])   . Radiation Jan.11-Jan 29/2016    Right central chest 35 gray in 14 fractions  . Radiation Jan.2016    35 gray in 14 fx lower lumbar/upper sacrum  . Diverticulosis   . Hemorrhoids   . Pneumonia     hx  . GERD (gastroesophageal reflux disease)   . Hypertension     no med now for 3-4 months  . Prostate cancer ([place]) 2005    gleason 7  . Skin cancer     ear  . Lung cancer ([place])   . Cancer [facility]) 2015    prostate   . Cancer [facility]) December 04, 2014    Cranial   . Cancer [facility]) 04/01/2015    spine  . Acute pulmonary embolism [facility])    Social History   Social History  . Marital Status: Divorced    Spouse Name: N/A  .  Number of Children: 3  . Years of Education: N/A   Occupational History  . Retired    Social History Main Topics  . Smoking status: Former Smoker -- 3.00 packs/day for 35 years    Types: Cigarettes    Quit date: 07/30/1993  . Smokeless tobacco: Never Used  . Alcohol Use: No     Comment: hx alcohol abuse per note dated 04/11/11  . Drug Use: No  . Sexual Activity: No   Other Topics Concern  . None   Social History Narrative   Divorced, retired, has 3 children.  Has been at [facility] for the last 2 months.     Family History  Problem Relation Age of Onset  . Asthma Sister   . Asthma Brother   . Coronary artery disease Other    Scheduled Meds: . antiseptic oral rinse  7 mL Mouth Rinse q12n4p  . budesonide-formoterol  2 puff Inhalation BID  . ceFEPime (MAXIPIME) IV  1 g Intravenous 3 times per day  . chlorhexidine  15 mL Mouth Rinse BID  . famotidine  20 mg Oral QHS  . ferrous sulfate  325 mg Oral Q breakfast  . fluticasone  1 spray Each Nare Daily  . folic acid  1 mg Oral Daily  . guaiFENesin   600 mg Oral BID  . hydrocortisone  25 mg Rectal BID  . hydrocortisone-pramoxine  1 applicator Rectal BID  . ipratropium-albuterol  3 mL Nebulization TID  . levothyroxine  50 mcg Oral QAC breakfast  . methylPREDNISolone (SOLU-MEDROL) injection  60 mg Intravenous Q12H  . mirtazapine  30 mg Oral QHS  . multivitamin with minerals  1 tablet Oral Daily  . tamsulosin  0.4 mg Oral QHS  . vancomycin  1,000 mg Intravenous Q12H   Continuous Infusions: . sodium chloride 100 mL/hr at 07/13/15 2252   PRN Meds:.ALPRAZolam, docusate sodium, lactulose, nitroGLYCERIN, oxyCODONE, prochlorperazine, senna Medications Prior to Admission:  Prior to Admission medications   Medication Sig Start Date End Date Taking? Authorizing Provider  albuterol (PROVENTIL HFA;VENTOLIN HFA) 108 (90 BASE) MCG/ACT inhaler Inhale into the lungs every 6 (six) hours as needed for wheezing or shortness of breath.   Yes Historical Provider, MD  ALPRAZolam (XANAX) 0.25 MG tablet Take 1 tablet (0.25 mg total) by mouth 2 (two) times daily as needed for anxiety. 05/09/15  Yes [name], MD  benzonatate (TESSALON) 100 MG capsule Take 1 capsule (100 mg total) by mouth 3 (three) times daily as needed for cough. 05/26/15  Yes Nicholas Hamman, MD  budesonide-formoterol (SYMBICORT) 160-4.5 MCG/ACT inhaler Inhale 2 puffs into the lungs 2 (two) times daily. 07/25/14  Yes [name], MD  dexamethasone (DECADRON) 4 MG tablet Take 4 mg by mouth daily.    Yes Historical Provider, MD  docusate sodium (COLACE) 100 MG capsule Take 100 mg by mouth at bedtime as needed for mild constipation.   Yes Historical Provider, MD  famotidine (PEPCID) 20 MG tablet Take 20 mg by mouth at bedtime.   Yes Historical Provider, MD  ferrous sulfate 325 (65 FE) MG tablet Take 1 tablet (325 mg total) by mouth daily with breakfast. 05/27/15  Yes Nicholas Hamman, MD  fluticasone (FLONASE) 50 MCG/ACT nasal spray USE 2 SPRAYS IN EACH NOSTRIL AT NIGHT 03/10/15  Yes Historical  Provider, MD  folic acid (FOLVITE) 1 MG tablet Take 1 tablet (1 mg total) by mouth daily. 05/27/15  Yes Nicholas Hamman, MD  guaiFENesin ([place]) 600 MG  12 hr tablet Take 1 tablet (600 mg total) by mouth 2 (two) times daily. 05/26/15  Yes Nicholas Hamman, MD  lactulose (CHRONULAC) 10 GM/15ML solution Take 15 mLs by mouth 2 (two) times daily as needed. constipaton 04/06/15  Yes Historical Provider, MD  levothyroxine (SYNTHROID) 50 MCG tablet Take 1 tablet (50 mcg total) by mouth daily before breakfast. 05/12/15  Yes Nicholas Bears, MD  mirtazapine (REMERON) 30 MG tablet Take 1 tablet (30 mg total) by mouth at bedtime. 03/31/15  Yes Nicholas Bears, MD  Multiple Vitamins-Minerals (THERA-M) TABS Take 1 tablet by mouth daily.   Yes Historical Provider, MD  nitroGLYCERIN (NITROSTAT) 0.4 MG SL tablet Place 0.4 mg under the tongue every 5 (five) minutes as needed for chest pain (MAX 3 TABLETS). 06/05/13  Yes Nicholas Cookey, MD  oxyCODONE (OXY IR/ROXICODONE) 5 MG immediate release tablet Take 1 tablet (5 mg total) by mouth every 6 (six) hours as needed for severe pain. 05/09/15  Yes [name], MD  prochlorperazine (COMPAZINE) 10 MG tablet Take 10 mg by mouth every 6 (six) hours as needed for nausea or vomiting.   Yes Historical Provider, MD  senna (SENOKOT) 8.6 MG tablet Take 1 tablet by mouth at bedtime as needed for constipation.   Yes Historical Provider, MD  tamsulosin (FLOMAX) 0.4 MG CAPS capsule Take 0.4 mg by mouth at bedtime.  10/09/14  Yes Historical Provider, MD  enoxaparin (LOVENOX) 120 MG/0.8ML injection Inject 120 mg into the skin daily.    Historical Provider, MD   No Known Allergies  Review of Systems  Constitutional: Positive for activity change and fatigue.  Respiratory: Positive for shortness of breath.   Musculoskeletal:       C/o leg and feet pain    Physical Exam  HENT:  Head: Normocephalic.  Cardiovascular: Tachycardia present.   Respiratory: Tachypnea noted. He has  decreased breath sounds in the right lower field and the left lower field. He has wheezes.  GI: Soft. Normal appearance and bowel sounds are normal.  Musculoskeletal:       Right shoulder: He exhibits decreased strength.  Skin: Skin is warm and dry.    Vital Signs: BP 164/80 mmHg  Pulse 91  Temp(Src) 97.4 F (36.3 C) (Oral)  Resp 18  Ht '5\' 6"'$  (1.676 m)  Wt 84.3 kg (185 lb 13.6 oz)  BMI 30.01 kg/m2  SpO2 96%  SpO2: SpO2: 96 % O2 Device:SpO2: 96 % O2 Flow Rate: .O2 Flow Rate (L/min): 4 L/min  IO: Intake/output summary:  Intake/Output Summary (Last 24 hours) at 07/14/15 0753 Last data filed at 07/14/15 0600  Gross per 24 hour  Intake 2813.33 ml  Output   1490 ml  Net 1323.33 ml    LBM: Last BM Date: 07/11/15 Baseline Weight: Weight: 83.915 kg (185 lb) Most recent weight: Weight: 84.3 kg (185 lb 13.6 oz)      Palliative Assessment/Data:    Additional Data Reviewed:  CBC:    Component Value Date/Time   WBC 7.8 07/14/2015 0352   WBC 5.3 06/23/2015 1046   HGB 9.0* 07/14/2015 0352   HGB 9.2* 06/23/2015 1046   HCT 28.9* 07/14/2015 0352   HCT 30.0* 06/23/2015 1046   PLT 39* 07/14/2015 0352   PLT 34* 06/23/2015 1046   MCV 98.6 07/14/2015 0352   MCV 96.8 06/23/2015 1046   NEUTROABS 6.9 07/12/2015 1318   NEUTROABS 4.1 06/23/2015 1046   LYMPHSABS 1.4 07/12/2015 1318   LYMPHSABS 0.6* 06/23/2015 1046   MONOABS  0.9 07/12/2015 1318   MONOABS 0.6 06/23/2015 1046   EOSABS 0.1 07/12/2015 1318   EOSABS 0.0 06/23/2015 1046   BASOSABS 0.0 07/12/2015 1318   BASOSABS 0.0 06/23/2015 1046   Comprehensive Metabolic Panel:    Component Value Date/Time   NA 138 07/14/2015 0352   NA 142 06/23/2015 1046   K 4.4 07/14/2015 0352   K 4.3 06/23/2015 1046   CL 105 07/14/2015 0352   CO2 22 07/14/2015 0352   CO2 28 06/23/2015 1046   BUN 18 07/14/2015 0352   BUN 25.0 06/23/2015 1046   CREATININE 0.53* 07/14/2015 0352   CREATININE 0.9 06/23/2015 1046   GLUCOSE 158* 07/14/2015 0352     GLUCOSE 85 06/23/2015 1046   CALCIUM 9.1 07/14/2015 0352   CALCIUM 9.5 06/23/2015 1046   AST 20 07/12/2015 1318   AST 15 06/23/2015 1046   ALT 19 07/12/2015 1318   ALT 33 06/23/2015 1046   ALKPHOS 76 07/12/2015 1318   ALKPHOS 76 06/23/2015 1046   BILITOT 2.0* 07/12/2015 1318   BILITOT 0.94 06/23/2015 1046   PROT 7.2 07/12/2015 1318   PROT 6.3* 06/23/2015 1046   ALBUMIN 3.9 07/12/2015 1318   ALBUMIN 3.3* 06/23/2015 1046     Time In: 0730 Time Out: 0845 Time Total: 75 min Greater than 50%  of this time was spent counseling and coordinating care related to the above assessment and plan.  Signed by: Nicholas Lessen, NP  Knox Royalty, NP  07/14/2015, 7:53 AM  Please contact Palliative Medicine Team phone at [phone] for questions and concerns.

## 2015-07-14 NOTE — Progress Notes (Signed)
Report called to [place] to arrive within the hour, personal items packed up and IV lines to be discontinued after Ativan IV

## 2015-07-14 NOTE — Clinical Social Work Note (Signed)
Clinical Social Work Assessment  Patient Details  Name: Nicholas Schmitt MRN: [id number] Date of Birth: 08/18/1935  Date of referral:  07/14/15               Reason for consult:  Facility Placement, Discharge Planning                Permission sought to share information with:  Facility Art therapist granted to share information::  Yes, Verbal Permission Granted  Name::        Agency::     Relationship::     Contact Information:     Housing/Transportation Living arrangements for the past 2 months:  [place] of Information:  Adult Children Patient Interpreter Needed:  None Criminal Activity/Legal Involvement Pertinent to Current Situation/Hospitalization:  No - Comment as needed Significant Relationships:  Adult Children Lives with:  Friends Do you feel safe going back to the place where you live?  No (Residential Hospice Home recommended.) Need for family participation in patient care:  Yes (Comment)  Care giving concerns: No concerns reported at this time.   Social Worker assessment / plan: Pt admitted from [facility] on 07/11/15 with Sepsis. Pt has a hx of Cancer. CSW consulted to assist with Residential Hospice Home placement. PN reviewed and Palliative Care Team contacted. CSW met with pt / family to offer hospice choice. Pt / family has chosen [facility]. Facility liaison contacted and referral provided. [facility] has offered pt placement which has been accepted. MD contacted and will dc pt to [facility] today. D/C summary has been sent to facility for review and report provided by nsg. PTAR transport required and medical necessity form completed. Pt / family are in agreement with d/c to [facility] today.  Employment status:  Retired Nurse, adult PT Recommendations:  Not assessed at this time Information / Referral to community resources:     Patient/Family's Response to care: Pt / family have  accepted [facility] placement.  Patient/Family's Understanding of and Emotional Response to Diagnosis, Current Treatment, and Prognosis:  Pt / family are aware of pt's medical status and prognosis. Palliative Care Team have met with pt / family to offer assistance with GOC and support. Pt / family are pleased that [facility] is able to accept pt today.  Emotional Assessment Appearance:  Appears stated age Attitude/Demeanor/Rapport:  Other (cooperative) Affect (typically observed):  Calm, Appropriate Orientation:  Oriented to Self, Oriented to Place, Oriented to Situation Alcohol / Substance use:  Not Applicable Psych involvement (Current and /or in the community):  No (Comment)  Discharge Needs  Concerns to be addressed:  Discharge Planning Concerns Readmission within the last 30 days:  No Current discharge risk:  None Barriers to Discharge:  No Barriers Identified   Loraine Maple  [phone] 07/14/2015, 3:19 PM

## 2015-07-14 NOTE — Discharge Summary (Addendum)
Discharge Summary  Nicholas Schmitt:[id number] DOB: 07/16/1935  PCP: [name], MD  Admit date: 07/12/2015 Discharge date: 07/14/2015  Time spent: >30mns  Recommendations for Outpatient Follow-up:  1. Patient is discharged to hospice facility ( [facility])  Discharge Diagnoses:  Active Hospital Problems   Diagnosis Date Noted  . Sepsis ([facility] 07/12/2015  . Palliative care encounter 07/14/2015  . Acute on chronic respiratory failure with hypoxia ([facility] 07/12/2015  . Hemorrhoids 07/12/2015  . Presence of IVC filter 05/21/2015  . Presence of permanent cardiac pacemaker 05/21/2015  . Thrombocytopenia ([facility] 05/21/2015  . Hypothyroidism 05/12/2015  . Malignant pleural effusion   . DVT (deep venous thrombosis) ([facility] 01/17/2015  . Anemia in neoplastic disease 06/04/2014  . Non-small cell carcinoma of lung, stage 4 ([facility] 01/30/2014  . COPD GOLD II 08/27/2010  . Essential hypertension 12/25/2006    Resolved Hospital Problems   Diagnosis Date Noted Date Resolved  No resolved problems to display.    Discharge Condition: stable  Diet recommendation: regular diet as tolerated  Filed Weights   07/12/15 1503 07/12/15 1700  Weight: 83.915 kg (185 lb) 84.3 kg (185 lb 13.6 oz)    History of present illness:  Nicholas VANWIERENis an 80y.o. male with a PMH of DVT on chronic anticoagulation with Lovenox, chronic respiratory failure on home oxygen therapy, stage IV lung cancer under the care of Dr. MJulien Nordmann status post chemotherapy as well as palliative radiotherapy to the right lung and lumbar spine, status post craniotomy with tumor excision, whose last 2 chemotherapy cycles had to be held due to persistent thrombocytopenia, with lung cancer complicated by malignant pleural effusion requiring thoracentesis, who was sent to the ED from SNF for evaluation of fever, chills and progressive dyspnea. Has had the flu vaccine as well as the pneumonia vaccine but is a resident of a  skilled nursing facility with sick contacts. The patient is currently wearing BiPAP and is unable to answer my questions in detail due to his significant dyspnea and respiratory distress. The fever appears to have begun within the past 24 hours with a deterioration in his respiratory status progressing to the acute onset of respiratory distress and wheezing. He also complains of hemorrhoidal bleeding. He was placed on BiPAP on admission.  Hospital Course:  Principal Problem:   Sepsis ([facility] [place] Active Problems:   Essential hypertension   COPD GOLD II   Non-small cell carcinoma of lung, stage 4 (HCC)   Anemia in neoplastic disease   DVT (deep venous thrombosis) (HCC)   Malignant pleural effusion   Hypothyroidism   Presence of IVC filter   Presence of permanent cardiac pacemaker   Thrombocytopenia (HCC)   Acute on chronic respiratory failure with hypoxia (HCC)   Hemorrhoids   Palliative care encounter  Sepsis: presented on admission, please see admitting H/P for details. Better  Acute on chronic respiratory failure: initial required bipap, off bipap on nasal cannula.  HCAP: on vanc/cefepime. Sputum culture no growth. mrsa screening negative, likely able to narrow abx if patient continue to improve. Now discharge to hospice facility. Passed swallow eval with regular diet.  COPD exacerbation: on steroids/abx/nebs/mucinex, better.  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  DVT/PE s/p IVC filter, lovenox held due to thrombocytopenia, now d/c to hospice facility on comfort measures  Thrombocytopenia: no  acute bleed, hold lovenox.  Anemia in neoplastic disease: hgb stable,   Code Status: DNR  Family Communication: patient and son  Disposition Plan: d/c to hospice facility on  1/31   Consultants:  Oncology  Palliative care  Procedures:  none  Antibiotics:  vanc/cefepime  Discharge Exam: BP 164/80 mmHg  Pulse 91  Temp(Src) 97.6 F (36.4 C) (Oral)  Resp 22  Ht '5\' 6"'$  (1.676 m)  Wt 84.3 kg (185 lb 13.6 oz)  BMI 30.01 kg/m2  SpO2 97%   General: Frail, oriented x3  Cardiovascular: RRR  Respiratory: diminished at basis, mild crackle and wheezing, respiratory effort adequate.  Abdomen: Soft/ND/NT, positive BS  Musculoskeletal: +RLE Edema  Neuro: aaox3   Discharge Instructions You were cared for by a hospitalist during your hospital stay. If you have any questions about your discharge medications or the care you received while you were in the hospital after you are discharged, you can call the unit and asked to speak with the hospitalist on call if the hospitalist that took care of you is not available. Once you are discharged, your primary care physician will handle any further medical issues. Please note that NO REFILLS for any discharge medications will be authorized once you are discharged, as it is imperative that you return to your primary care physician (or establish a relationship with a primary care physician if you do not have one) for your aftercare needs so that they can reassess your need for medications and monitor your lab values.      Discharge Instructions    Diet - low sodium heart healthy    Complete by:  As directed      Increase activity slowly    Complete by:  As directed             Medication List    STOP taking these medications        docusate sodium 100 MG capsule  Commonly known as:  COLACE     enoxaparin 120 MG/0.8ML injection  Commonly known as:  LOVENOX     senna 8.6 MG tablet  Commonly known as:  SENOKOT      TAKE these medications        albuterol 108 (90 Base) MCG/ACT inhaler  Commonly known as:  PROVENTIL HFA;VENTOLIN HFA  Inhale into the lungs every 6 (six) hours as needed for wheezing or  shortness of breath.     ALPRAZolam 0.25 MG tablet  Commonly known as:  XANAX  Take 1 tablet (0.25 mg total) by mouth 2 (two) times daily as needed for anxiety.     benzonatate 100 MG capsule  Commonly known as:  TESSALON  Take 1 capsule (100 mg total) by mouth 3 (three) times daily as needed for cough.     budesonide-formoterol 160-4.5 MCG/ACT inhaler  Commonly known as:  SYMBICORT  Inhale 2 puffs into the lungs 2 (two) times daily.     dexamethasone 4 MG tablet  Commonly known as:  DECADRON  Take 4 mg by mouth daily.     famotidine 20 MG tablet  Commonly known as:  PEPCID  Take 20 mg by mouth at bedtime.     ferrous sulfate 325 (65 FE) MG tablet  Take 1 tablet (325 mg total) by mouth daily with breakfast.     fluticasone 50 MCG/ACT nasal spray  Commonly known as:  FLONASE  USE 2 SPRAYS IN EACH NOSTRIL AT NIGHT     folic acid 1 MG tablet  Commonly known as:  FOLVITE  Take 1 tablet (1 mg total) by mouth daily.     guaiFENesin 600 MG 12 hr tablet  Commonly known as:  MUCINEX  Take 1 tablet (600 mg total) by mouth 2 (two) times daily.     hydrocortisone 25 MG suppository  Commonly known as:  ANUSOL-HC  Place 1 suppository (25 mg total) rectally 2 (two) times daily.     hydrocortisone-pramoxine rectal foam  Commonly known as:  PROCTOFOAM-HC  Place 1 applicator rectally 2 (two) times daily.     lactulose 10 GM/15ML solution  Commonly known as:  CHRONULAC  Take 15 mLs by mouth 2 (two) times daily as needed. constipaton     levothyroxine 50 MCG tablet  Commonly known as:  SYNTHROID  Take 1 tablet (50 mcg total) by mouth daily before breakfast.     mirtazapine 30 MG tablet  Commonly known as:  REMERON  Take 1 tablet (30 mg total) by mouth at bedtime.     nitroGLYCERIN 0.4 MG SL tablet  Commonly known as:  NITROSTAT  Place 0.4 mg under the tongue every 5 (five) minutes as needed for chest pain (MAX 3 TABLETS).     oxyCODONE 5 MG immediate release tablet  Commonly  known as:  Oxy IR/ROXICODONE  Take 1 tablet (5 mg total) by mouth every 6 (six) hours as needed for severe pain.     predniSONE 10 MG tablet  Commonly known as:  DELTASONE  Label  & dispense according to the schedule below. 6 Pills PO for one day, then 5 Pills PO for 1 day, then 4 Pills PO for 1 day, then 3 Pills PO for 1 day, then 2 Pills PO for 1 day, then 1 Pills PO for 1 day, then STOP.     prochlorperazine 10 MG tablet  Commonly known as:  COMPAZINE  Take 10 mg by mouth every 6 (six) hours as needed for nausea or vomiting.     senna-docusate 8.6-50 MG tablet  Commonly known as:  Senokot-S  Take 1 tablet by mouth 2 (two) times daily.     tamsulosin 0.4 MG Caps capsule  Commonly known as:  FLOMAX  Take 0.4 mg by mouth at bedtime.     THERA-M Tabs  Take 1 tablet by mouth daily.       No Known Allergies    The results of significant diagnostics from this hospitalization (including imaging, microbiology, ancillary and laboratory) are listed below for reference.    Significant Diagnostic Studies: Dg Chest 1 View  07/12/2015  CLINICAL DATA:  Sudden onset shortness of breath with decreased breath sounds. EXAM: CHEST 1 VIEW COMPARISON:  05/25/2015. FINDINGS: 1303 hours. Stable asymmetric lesions of right hemidiaphragm. Diffuse asymmetric interstitial and patchy airspace opacity is seen bilaterally. The cardio pericardial silhouette is enlarged. The visualized bony structures of the thorax are intact. Telemetry leads overlie the chest. IMPRESSION: Interstitial and patchy bilateral airspace disease. Imaging features suggest pulmonary edema or asymmetric infection. Electronically Signed   By: Misty Stanley M.D.   On: 07/12/2015 13:23    Microbiology: Recent Results (from the past 240 hour(s))  Blood culture (routine x 2)     Status: None (Preliminary result)   Collection Time: 07/12/15  1:30 PM  Result Value Ref Range Status   Specimen Description BLOOD LEFT ANTECUBITAL  Final    Special Requests BOTTLES DRAWN AEROBIC AND ANAEROBIC 5CC  Final   Culture   Final    NO GROWTH  2 DAYS Performed at [facility]    Report Status PENDING  Incomplete  Blood culture (routine x 2)     Status: None (Preliminary result)   Collection Time: 07/12/15  1:42 PM  Result Value Ref Range Status   Specimen Description BLOOD BLOOD LEFT HAND  Final   Special Requests IN PEDIATRIC BOTTLE 3CC  Final   Culture   Final    NO GROWTH 2 DAYS Performed at [facility]    Report Status PENDING  Incomplete  MRSA PCR Screening     Status: None   Collection Time: 07/12/15  4:58 PM  Result Value Ref Range Status   MRSA by PCR NEGATIVE NEGATIVE Final    Comment:        The GeneXpert MRSA Assay (FDA approved for NASAL specimens only), is one component of a comprehensive MRSA colonization surveillance program. It is not intended to diagnose MRSA infection nor to guide or monitor treatment for MRSA infections.   Culture, respiratory (NON-Expectorated)     Status: None (Preliminary result)   Collection Time: 07/13/15  5:00 PM  Result Value Ref Range Status   Specimen Description SPUTUM  Final   Special Requests NONE  Final   Gram Stain   Final    ABUNDANT WBC PRESENT,BOTH PMN AND MONONUCLEAR FEW SQUAMOUS EPITHELIAL CELLS PRESENT NO ORGANISMS SEEN Performed at [facility]    Culture NO GROWTH Performed at [facility]   Final   Report Status PENDING  Incomplete  Culture, expectorated sputum-assessment     Status: None   Collection Time: 07/13/15  5:03 PM  Result Value Ref Range Status   Specimen Description SPUTUM  Final   Special Requests NONE  Final   Sputum evaluation   Final    THIS SPECIMEN IS ACCEPTABLE. RESPIRATORY CULTURE REPORT TO FOLLOW.   Report Status 07/13/2015 FINAL  Final     Labs: Basic Metabolic Panel:  Recent Labs Lab 07/08/15 0401 07/12/15 1318 07/14/15 0352  NA 141 140 138  K 4.4 4.2 4.4  CL 105 101 105  CO2 '25 23 22    '$ GLUCOSE 114* 118* 158*  BUN 34* 27* 18  CREATININE 0.70 0.67 0.53*  CALCIUM 9.6 10.0 9.1  MG  --   --  2.2   Liver Function Tests:  Recent Labs Lab 07/12/15 1318  AST 20  ALT 19  ALKPHOS 76  BILITOT 2.0*  PROT 7.2  ALBUMIN 3.9   No results for input(s): LIPASE, AMYLASE in the last 168 hours. No results for input(s): AMMONIA in the last 168 hours. CBC:  Recent Labs Lab 07/08/15 0401 07/12/15 1318 07/14/15 0352  WBC 5.9 9.3 7.8  NEUTROABS 4.8 6.9  --   HGB 10.3* 10.4* 9.0*  HCT 33.1* 33.9* 28.9*  MCV 99.1 99.7 98.6  PLT 23* 43* 39*   Cardiac Enzymes: No results for input(s): CKTOTAL, CKMB, CKMBINDEX, TROPONINI in the last 168 hours. BNP: BNP (last 3 results)  Recent Labs  07/21/14 1821 05/05/15 0556 05/21/15 1104  BNP 39.7 74.3 79.0    ProBNP (last 3 results) No results for input(s): PROBNP in the last 8760 hours.  CBG:  Recent Labs Lab 07/13/15 0759 07/13/15 1219 07/13/15 1748  GLUCAP 107* 220* 233*       SignedFlorencia Reasons MD, PhD  [facility] 07/14/2015, 1:59 PM

## 2015-07-16 LAB — CULTURE, RESPIRATORY W GRAM STAIN: Culture: NORMAL

## 2015-07-16 LAB — CULTURE, RESPIRATORY

## 2015-07-17 ENCOUNTER — Telehealth: Payer: Self-pay | Admitting: *Deleted

## 2015-07-17 LAB — CULTURE, BLOOD (ROUTINE X 2)
CULTURE: NO GROWTH
Culture: NO GROWTH

## 2015-07-17 NOTE — Telephone Encounter (Signed)
Nicholas Schmitt, pt's girlfriend called states "Please tell Dr. Julien Nordmann how thankful we are for him and the care that he provided for [facility]. He passed yesterday 07/18/2015 at Hospice." gave Nicholas our condolences and thanked her for the call. POF to scheduling to cancel all appts

## 2015-07-19 NOTE — ED Provider Notes (Signed)
CSN: [id number]     Arrival date & time 07/12/15  1251 History   First MD Initiated Contact with Patient 07/12/15 1319     Chief Complaint  Patient presents with  . Shortness of Breath     (Consider location/radiation/quality/duration/timing/severity/associated sxs/prior Treatment) HPI Patient has significant medical history of stage IV lung cancer. He reports he got very short of breath very quickly. He is not endorsing significant chest pain at this time. Report from [facility] for fever and increasing difficulty breathing. Patient reports he feels very unwell. He does not describe vomiting or diarrhea. He is in significant respiratory distress. Past Medical History  Diagnosis Date  . CAD (coronary artery disease)     positive stress test in 2006 led to left heart cath (8/06) showing 95% prox RCA, 90% CFX, and 90% mLAD.  patient had a cypher DES to all 3 lesions  . Hyperlipidemia   . Hearing loss   . AAA (abdominal aortic aneurysm) ([place]) 11/09    3.2 cm   . Chronic renal insufficiency   . History of radiation therapy 03/08/04- 04/08/04    prostate 4600 cGy in 3 fractions, radioactive seed implant 05/04/04  . Myocardial infarction ([place]) 1995  . Pacemaker 2014  . Shortness of breath     exertion  . COPD (chronic obstructive pulmonary disease) ([place])   . Radiation Jan.11-Jan 29/2016    Right central chest 35 gray in 14 fractions  . Radiation Jan.2016    35 gray in 14 fx lower lumbar/upper sacrum  . Diverticulosis   . Hemorrhoids   . Pneumonia     hx  . GERD (gastroesophageal reflux disease)   . Hypertension     no med now for 3-4 months  . Prostate cancer ([place]) 2005    gleason 7  . Skin cancer     ear  . Lung cancer ([place])   . Cancer [facility]) 2015    prostate   . Cancer [facility]) December 04, 2014    Cranial   . Cancer [facility]) 04/01/2015    spine  . Acute pulmonary embolism [facility])    Past Surgical History  Procedure Laterality Date  . Ptca  2006  . Coronary stent placement    .  Permanent pacemaker insertion Bilateral 02/26/13    MDT Adapta L implanted by Dr Rayann Heman for sick sinus syndrome  . Radioactive seed implant  05/04/2004    8000 cGy, Dr Danny Lawless Dr Reece Agar  . Elbow surgery Bilateral 1973  . Coronary angioplasty    . Mediastinoscopy N/A 01/28/2014    Procedure: MEDIASTINOSCOPY;  Surgeon: Melrose Nakayama, MD;  Location: [place];  Service: Thoracic;  Laterality: N/A;  . Permanent pacemaker insertion N/A 02/26/2013    Procedure: PERMANENT PACEMAKER INSERTION;  Surgeon: [name], MD;  Location: [place] CATH LAB;  Service: Cardiovascular;  Laterality: N/A;  . Insert / replace / remove pacemaker      not removed  . Craniotomy N/A 12/04/2014    Procedure: CRANIOTOMY TUMOR EXCISION, LEFT;  Surgeon: [name], MD;  Location: [place] NEURO ORS;  Service: Neurosurgery;  Laterality: N/A;  CRANIOTOMY TUMOR EXCISION  . Application of cranial navigation  12/04/2014    Procedure: APPLICATION OF CRANIAL NAVIGATION;  Surgeon: [name], MD;  Location: [place] ORS;  Service: Neurosurgery;;  . [place] Dates without cardioversion N/A 05/06/2015    Procedure: TRANSESOPHAGEAL ECHOCARDIOGRAM (TEE);  Surgeon: [name], MD;  Location: [place];  Service: Cardiovascular;  Laterality: N/A;   Family History  Problem Relation Age of Onset  . Asthma Sister   . Asthma Brother   . Coronary artery disease Other    Social History  Substance Use Topics  . Smoking status: Former Smoker -- 3.00 packs/day for 35 years    Types: Cigarettes    Quit date: 07/30/1993  . Smokeless tobacco: Never Used  . Alcohol Use: No     Comment: hx alcohol abuse per note dated 04/11/11    Review of Systems Cannot obtain full review of systems due to patient condition, significant respiratory distress.   Allergies  Review of patient's allergies indicates no known allergies.  Home Medications   Prior to Admission medications   Medication Sig Start Date End Date Taking? Authorizing Provider   albuterol (PROVENTIL HFA;VENTOLIN HFA) 108 (90 BASE) MCG/ACT inhaler Inhale into the lungs every 6 (six) hours as needed for wheezing or shortness of breath.   Yes Historical Provider, MD  ALPRAZolam (XANAX) 0.25 MG tablet Take 1 tablet (0.25 mg total) by mouth 2 (two) times daily as needed for anxiety. 05/09/15  Yes [name], MD  benzonatate (TESSALON) 100 MG capsule Take 1 capsule (100 mg total) by mouth 3 (three) times daily as needed for cough. 05/26/15  Yes [name], MD  budesonide-formoterol (SYMBICORT) 160-4.5 MCG/ACT inhaler Inhale 2 puffs into the lungs 2 (two) times daily. 07/25/14  Yes [name], MD  dexamethasone (DECADRON) 4 MG tablet Take 4 mg by mouth daily.    Yes Historical Provider, MD  famotidine (PEPCID) 20 MG tablet Take 20 mg by mouth at bedtime.   Yes Historical Provider, MD  ferrous sulfate 325 (65 FE) MG tablet Take 1 tablet (325 mg total) by mouth daily with breakfast. 05/27/15  Yes [name], MD  fluticasone (FLONASE) 50 MCG/ACT nasal spray USE 2 SPRAYS IN EACH NOSTRIL AT NIGHT 03/10/15  Yes Historical Provider, MD  folic acid (FOLVITE) 1 MG tablet Take 1 tablet (1 mg total) by mouth daily. 05/27/15  Yes [name], MD  guaiFENesin (MUCINEX) 600 MG 12 hr tablet Take 1 tablet (600 mg total) by mouth 2 (two) times daily. 05/26/15  Yes [name], MD  lactulose (CHRONULAC) 10 GM/15ML solution Take 15 mLs by mouth 2 (two) times daily as needed. constipaton 04/06/15  Yes Historical Provider, MD  levothyroxine (SYNTHROID) 50 MCG tablet Take 1 tablet (50 mcg total) by mouth daily before breakfast. 05/12/15  Yes Curt Bears, MD  mirtazapine (REMERON) 30 MG tablet Take 1 tablet (30 mg total) by mouth at bedtime. 03/31/15  Yes Curt Bears, MD  Multiple Vitamins-Minerals (THERA-M) TABS Take 1 tablet by mouth daily.   Yes Historical Provider, MD  nitroGLYCERIN (NITROSTAT) 0.4 MG SL tablet Place 0.4 mg under the tongue every 5 (five) minutes as needed  for chest pain (MAX 3 TABLETS). 06/05/13  Yes Dorena Cookey, MD  prochlorperazine (COMPAZINE) 10 MG tablet Take 10 mg by mouth every 6 (six) hours as needed for nausea or vomiting.   Yes Historical Provider, MD  tamsulosin (FLOMAX) 0.4 MG CAPS capsule Take 0.4 mg by mouth at bedtime.  10/09/14  Yes Historical Provider, MD  hydrocortisone (ANUSOL-HC) 25 MG suppository Place 1 suppository (25 mg total) rectally 2 (two) times daily. 07/14/15   Florencia Reasons, MD  hydrocortisone-pramoxine  [facility]) rectal foam Place 1 applicator rectally 2 (two) times daily. 07/14/15   Florencia Reasons, MD  oxyCODONE (OXY IR/ROXICODONE) 5 MG immediate release tablet Take 1 tablet (5 mg total) by  mouth every 6 (six) hours as needed for severe pain. 07/14/15   Florencia Reasons, MD  predniSONE (DELTASONE) 10 MG tablet Label  & dispense according to the schedule below. 6 Pills PO for one day, then 5 Pills PO for 1 day, then 4 Pills PO for 1 day, then 3 Pills PO for 1 day, then 2 Pills PO for 1 day, then 1 Pills PO for 1 day, then STOP. 07/14/15   Florencia Reasons, MD  senna-docusate (SENOKOT-S) 8.6-50 MG tablet Take 1 tablet by mouth 2 (two) times daily. 07/14/15   Florencia Reasons, MD   BP 164/80 mmHg  Pulse 91  Temp(Src) 97.8 F (36.6 C) (Oral)  Resp 28  Ht '5\' 6"'$  (1.676 m)  Wt 185 lb 13.6 oz (84.3 kg)  BMI 30.01 kg/m2  SpO2 97% Physical Exam  Constitutional:  Patient appears very deconditioned. He is obese and has severe respiratory distress. He is very pale in appearance.  HENT:  Head: Normocephalic and atraumatic.  Eyes: EOM are normal.  Cardiovascular:  The patient is very tachycardic, cannot appreciate rub murmur gallop at this time. There is significant associated respiratory noise. Distal pulses are thready.  Pulmonary/Chest:  Severe respiratory distress. Rhonchi and wheezes throughout. Soft breath sounds right base.  Abdominal: Soft. He exhibits no distension. There is no tenderness.  Musculoskeletal: He exhibits edema.  Neurological: He is  alert.  Skin:  Skin is cool and dry and pale.  Psychiatric:  Patient is extremely anxious.    ED Course  Procedures (including critical care time) CRITICAL CARE Performed by: Charlesetta Shanks   Total critical care time: 45 minutes  Critical care time was exclusive of separately billable procedures and treating other patients.  Critical care was necessary to treat or prevent imminent or life-threatening deterioration.  Critical care was time spent personally by me on the following activities: development of treatment plan with patient and/or surrogate as well as nursing, discussions with consultants, evaluation of patient's response to treatment, examination of patient, obtaining history from patient or surrogate, ordering and performing treatments and interventions, ordering and review of laboratory studies, ordering and review of radiographic studies, pulse oximetry and re-evaluation of patient's condition. Labs Review Labs Reviewed  CBC WITH DIFFERENTIAL/PLATELET - Abnormal; Notable for the following:    RBC 3.40 (*)    Hemoglobin 10.4 (*)    HCT 33.9 (*)    RDW 20.3 (*)    Platelets 43 (*)    All other components within normal limits  COMPREHENSIVE METABOLIC PANEL - Abnormal; Notable for the following:    Glucose, Bld 118 (*)    BUN 27 (*)    Total Bilirubin 2.0 (*)    Anion gap 16 (*)    All other components within normal limits  URINALYSIS, ROUTINE W REFLEX MICROSCOPIC (NOT AT [facility]) - Abnormal; Notable for the following:    Color, Urine AMBER (*)    All other components within normal limits  PROTIME-INR - Abnormal; Notable for the following:    Prothrombin Time 18.5 (*)    INR 1.60 (*)    All other components within normal limits  GLUCOSE, CAPILLARY - Abnormal; Notable for the following:    Glucose-Capillary 107 (*)    All other components within normal limits  GLUCOSE, CAPILLARY - Abnormal; Notable for the following:    Glucose-Capillary 220 (*)    All other  components within normal limits  CBC - Abnormal; Notable for the following:    RBC 2.93 (*)  Hemoglobin 9.0 (*)    HCT 28.9 (*)    RDW 19.5 (*)    Platelets 39 (*)    All other components within normal limits  BASIC METABOLIC PANEL - Abnormal; Notable for the following:    Glucose, Bld 158 (*)    Creatinine, Ser 0.53 (*)    All other components within normal limits  GLUCOSE, CAPILLARY - Abnormal; Notable for the following:    Glucose-Capillary 233 (*)    All other components within normal limits  I-STAT CG4 LACTIC ACID, ED - Abnormal; Notable for the following:    Lactic Acid, Venous 4.39 (*)    All other components within normal limits  CULTURE, BLOOD (ROUTINE X 2)  CULTURE, BLOOD (ROUTINE X 2)  MRSA PCR SCREENING  RESPIRATORY VIRUS PANEL  CULTURE, EXPECTORATED SPUTUM-ASSESSMENT  CULTURE, RESPIRATORY (NON-EXPECTORATED)  INFLUENZA PANEL BY PCR (TYPE A & B, H1N1)  PROCALCITONIN  APTT  STREP PNEUMONIAE URINARY ANTIGEN  LACTIC ACID, PLASMA  LACTIC ACID, PLASMA  MAGNESIUM  I-STAT TROPOININ, ED  I-STAT CG4 LACTIC ACID, ED    Imaging Review No results found. I have personally reviewed and evaluated these images and lab results as part of my medical decision-making.   EKG Interpretation   Date/Time:  Sunday July 12 2015 13:26:23 EST Ventricular Rate:  127 PR Interval:  147 QRS Duration: 142 QT Interval:  322 QTC Calculation: 468 R Axis:   66 Text Interpretation:  Sinus tachycardia Right bundle branch block ED  PHYSICIAN INTERPRETATION AVAILABLE IN [facility] Confirmed by TEST,  Record ([id number]) on 07/13/2015 6:45:29 AM     Consult: Patient's case is reviewed with hospitalist for admission. MDM   Final diagnoses:  Sepsis, due to unspecified organism ([place])  Acute on chronic respiratory failure with hypoxia [facility])   Patient presents in severe respiratory distress. He is also febrile. At this time sepsis is treatment is initiated with respiratory support on  BiPAP with administration of DuoNeb's. Patient is critically ill. He has severe medical comorbidities. He has shown improvement with initial management. Patient will be admitted to hospitalist service for ongoing treatment.    Charlesetta Shanks, MD 07/19/15 [phone]

## 2015-07-20 ENCOUNTER — Telehealth: Payer: Self-pay | Admitting: Internal Medicine

## 2015-07-20 NOTE — Telephone Encounter (Signed)
Per 2/3 pof cx all appointments patient passes away - chart statused accordingly.

## 2015-08-05 ENCOUNTER — Ambulatory Visit: Payer: PPO | Admitting: Internal Medicine

## 2015-08-05 ENCOUNTER — Ambulatory Visit: Payer: PPO

## 2015-08-05 ENCOUNTER — Other Ambulatory Visit: Payer: PPO

## 2015-08-12 DEATH — deceased

## 2015-08-26 ENCOUNTER — Ambulatory Visit: Payer: PPO | Admitting: Internal Medicine

## 2015-08-26 ENCOUNTER — Ambulatory Visit: Payer: PPO

## 2015-08-26 ENCOUNTER — Other Ambulatory Visit: Payer: PPO

## 2015-08-26 ENCOUNTER — Encounter: Payer: PPO | Admitting: Nutrition

## 2015-09-16 ENCOUNTER — Ambulatory Visit: Payer: PPO

## 2015-09-16 ENCOUNTER — Other Ambulatory Visit: Payer: PPO

## 2015-12-01 ENCOUNTER — Other Ambulatory Visit: Payer: Self-pay | Admitting: Nurse Practitioner

## 2016-08-03 IMAGING — CR DG CHEST 2V
2 series · 2 of 2 positions shown · non-contrast
Comparison: 05/05/2015

CLINICAL DATA: Shortness of breath with exertion

EXAM:
CHEST  2 VIEW

[w chest pa]
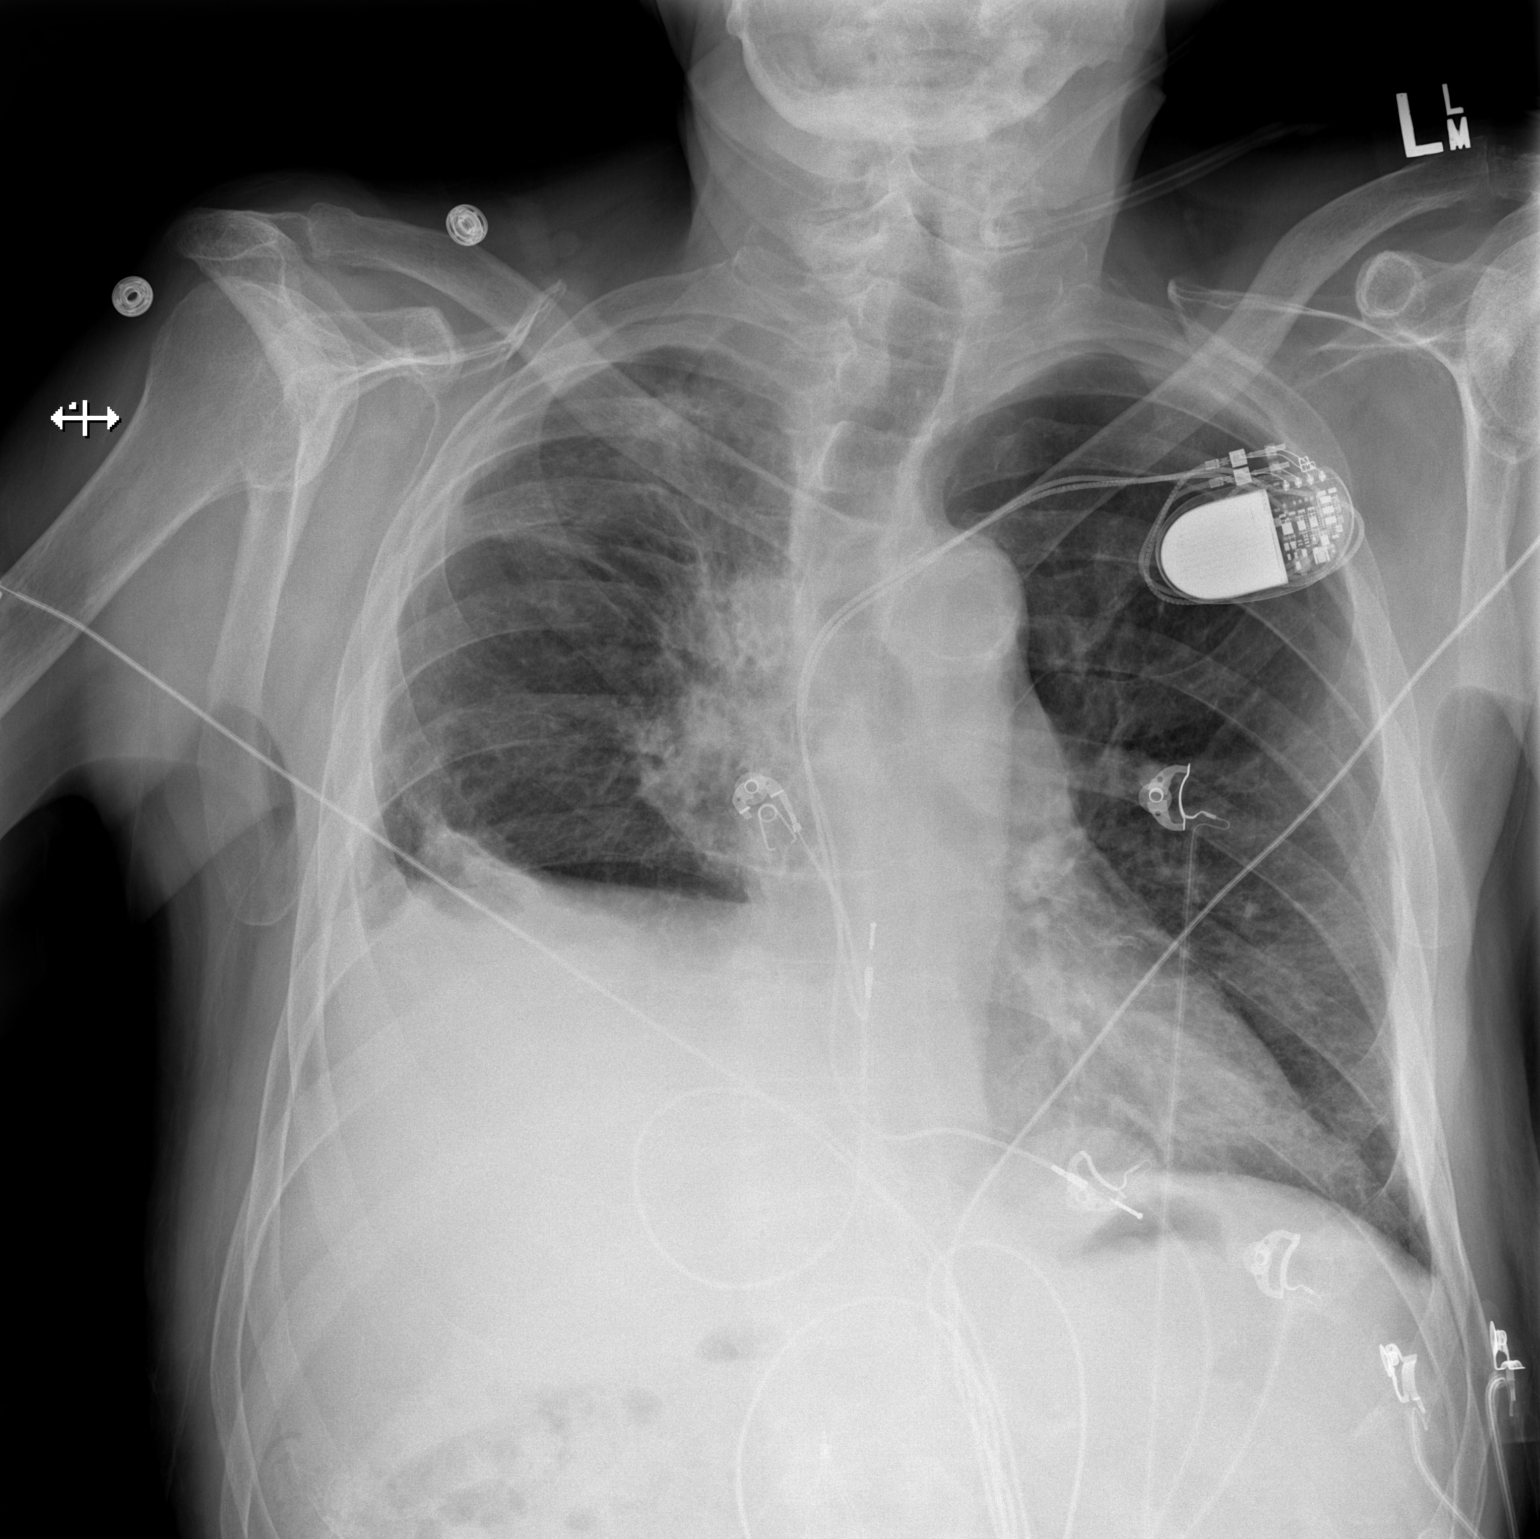

[w chest lat]
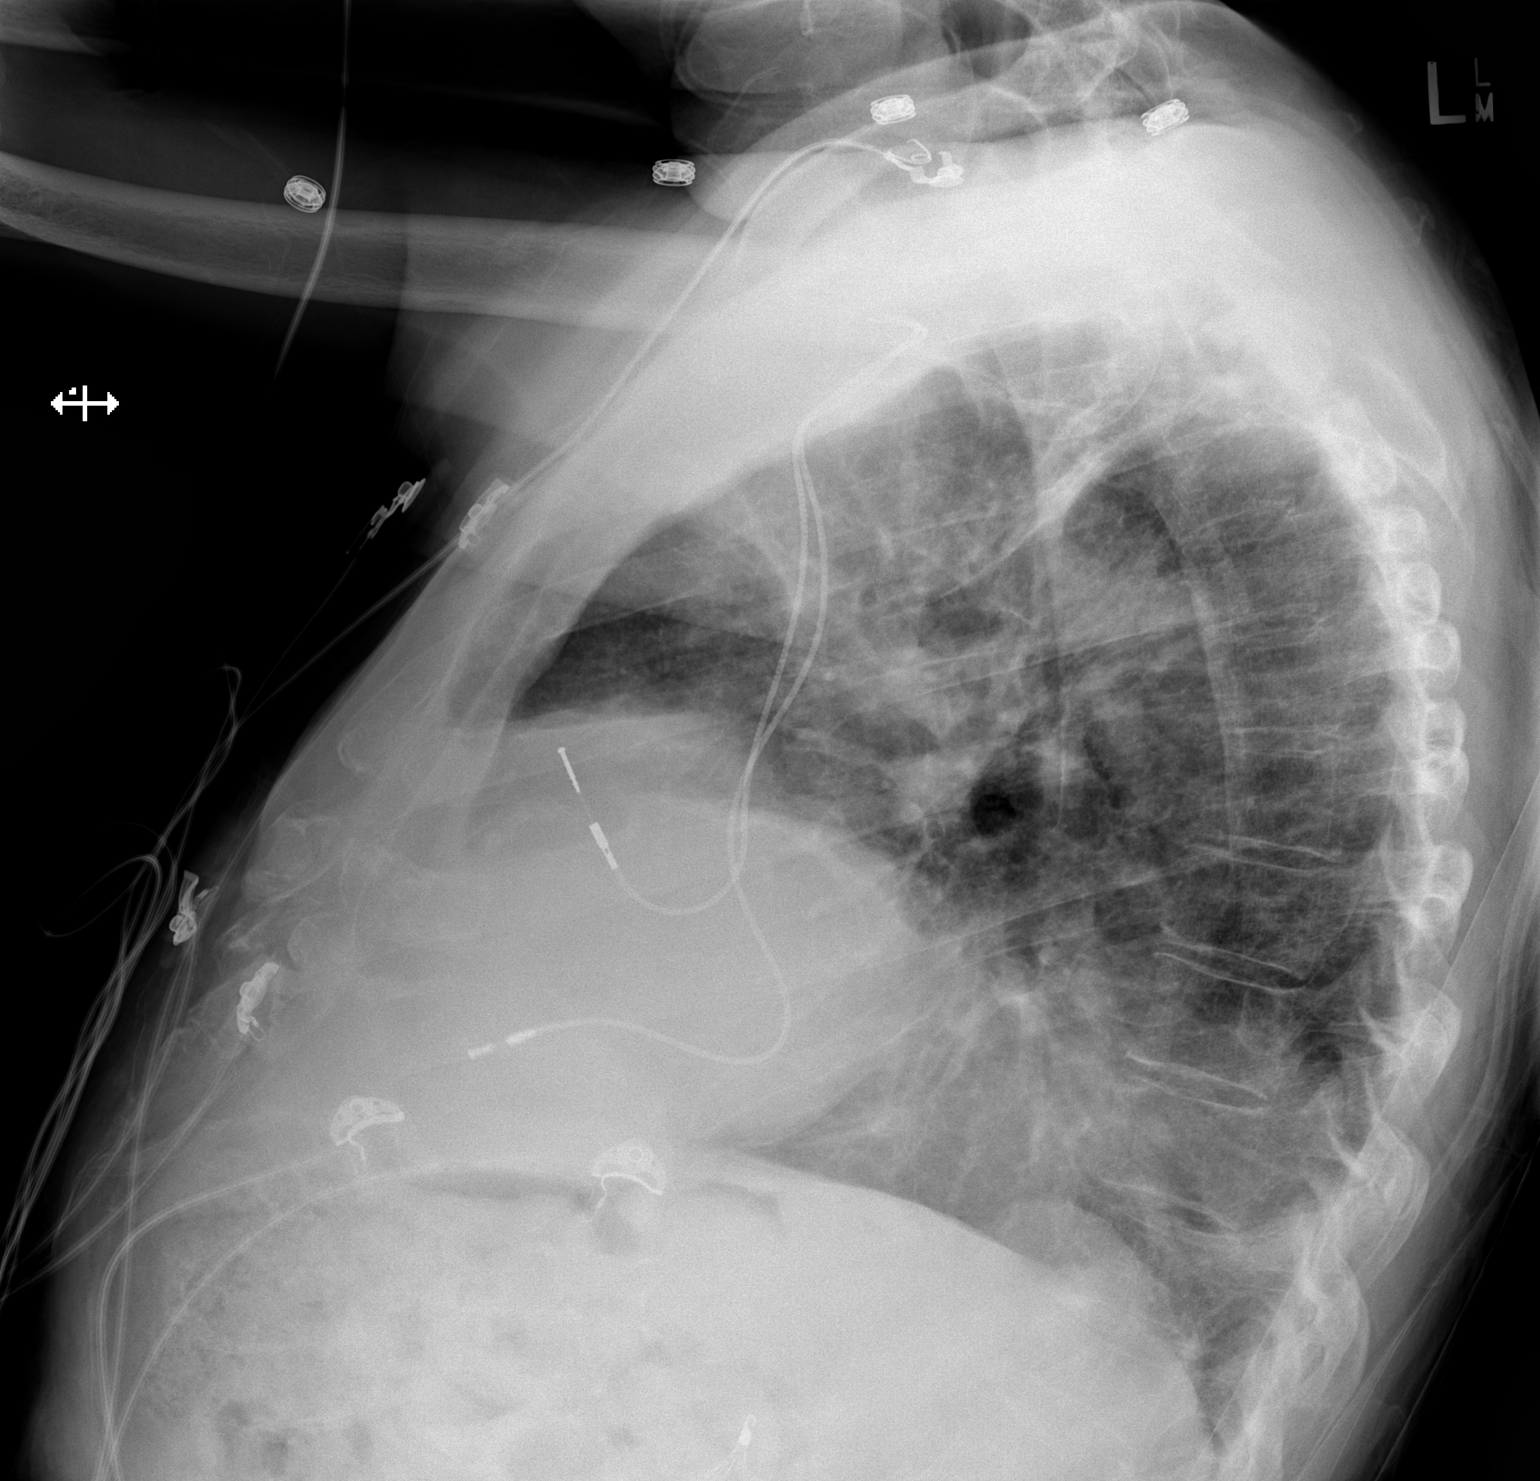

[2 of 2 positions shown; findings below may reference images not displayed]

FINDINGS: Normal heart size. Dual-chamber pacer leads from the left are in
unremarkable position.

There is right perihilar fibrosis based on staging chest CT
03/27/2015. There is associated volume loss in the right upper lobe.
Chronic elevation the right diaphragm with pleural effusion that is
stable from 05/05/2015. Mild atelectasis or scarring at the left
base.
IMPRESSION: 1. No change from 05/05/2015 to suggest acute disease.
2. Right perihilar fibrosis and upper lobe atelectasis from lung
cancer radiation.
3. Small right pleural effusion.

## 2016-08-04 IMAGING — CT CT HEAD WO/W CM
1 of 3 series · 13 of 30 positions shown, 17 images · IV contrast (OMNIPAQUE)
Comparison: 03/13/2015.

CLINICAL DATA: 79-year-old male with lung cancer metastatic to the
brain status post tumor resection and stereotactic radiosurgery in
[REDACTED]. Subsequent encounter.

EXAM:
CT HEAD WITHOUT AND WITH CONTRAST
TECHNIQUE: Contiguous axial images were obtained from the base of the skull
through the vertex without and with intravenous contrast
CONTRAST:  75mL OMNIPAQUE IOHEXOL 300 MG/ML  SOLN

[Series 603: <mpr range(1)> · axial · 0.51mm/px · z∈[-199,-45]mm · 13 of 150 slices shown, 17 images]
[im 11/150  brain]
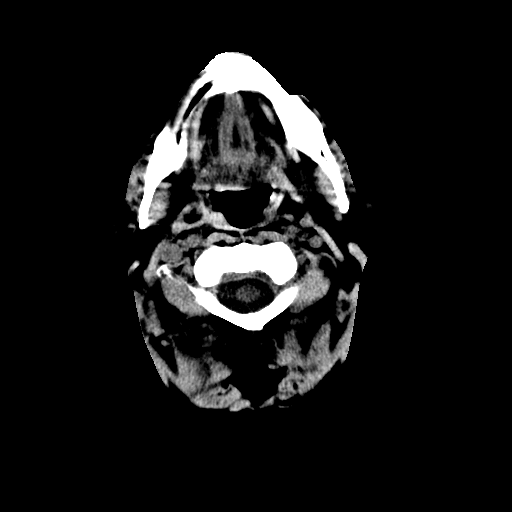
[im 11/150  bone]
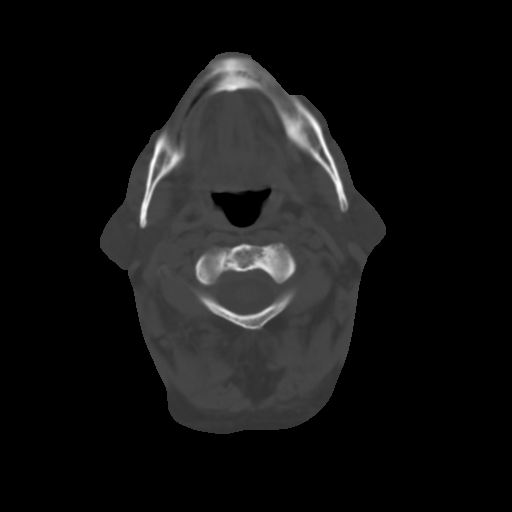
[im 22/150  brain]
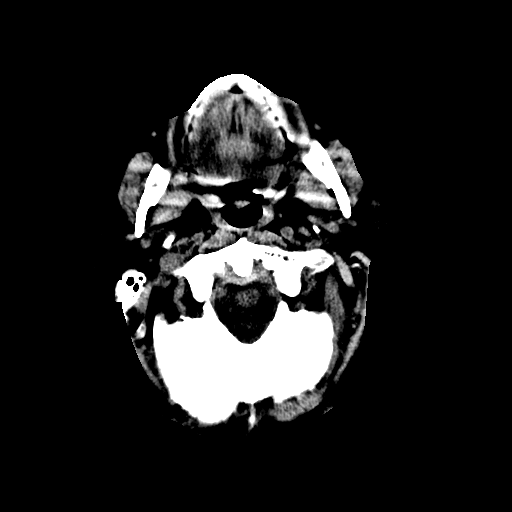
[im 32/150  brain]
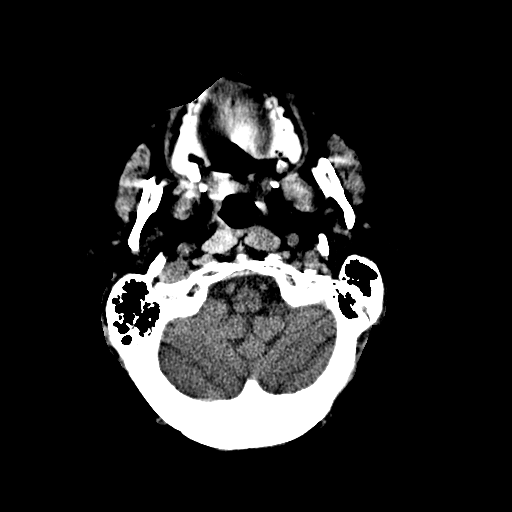
[im 43/150  brain]
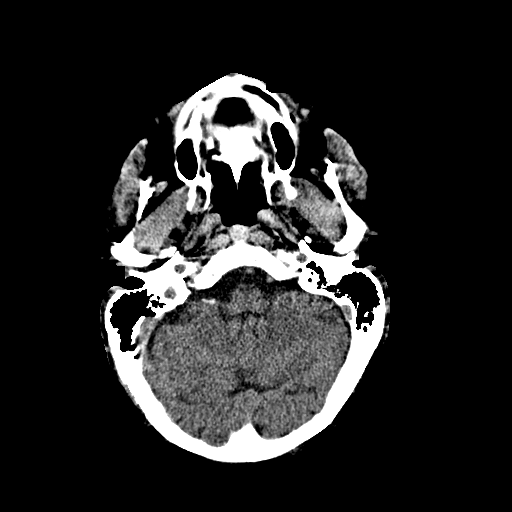
[im 54/150  brain]
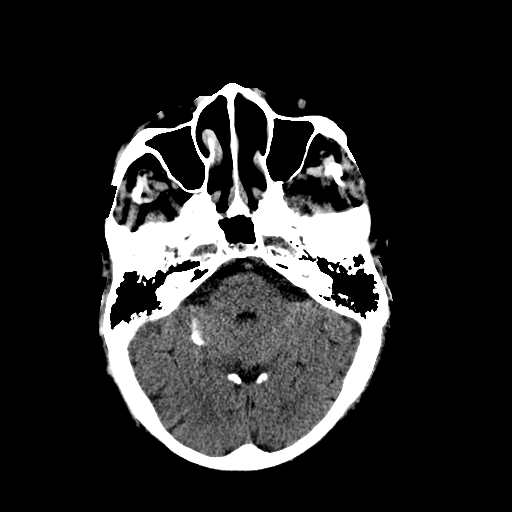
[im 54/150  bone]
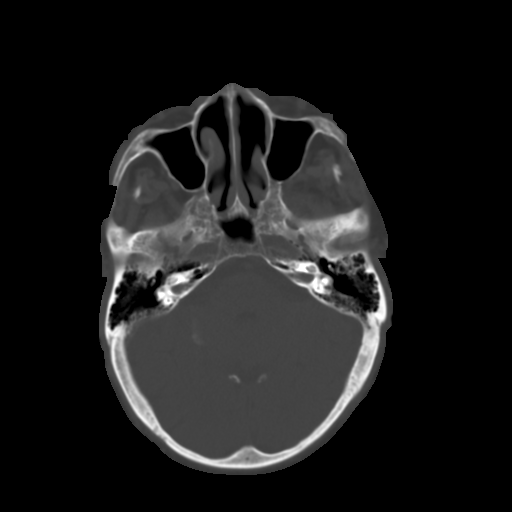
[im 64/150  brain]
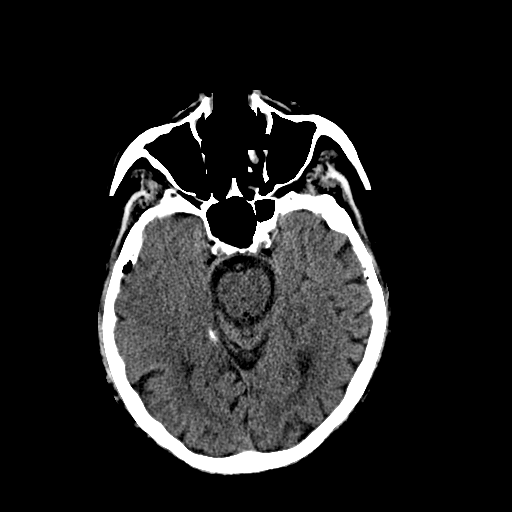
[im 75/150  brain]
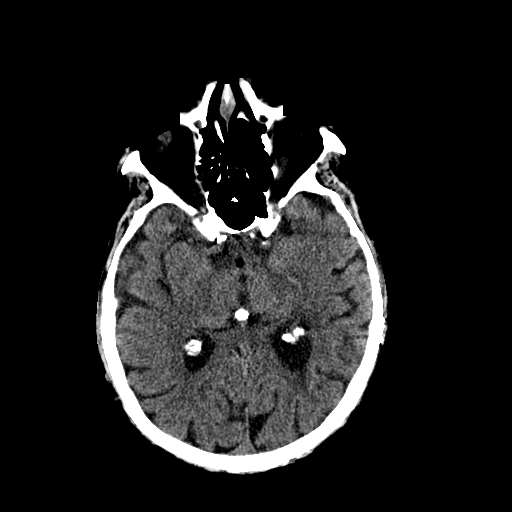
[im 86/150  brain]
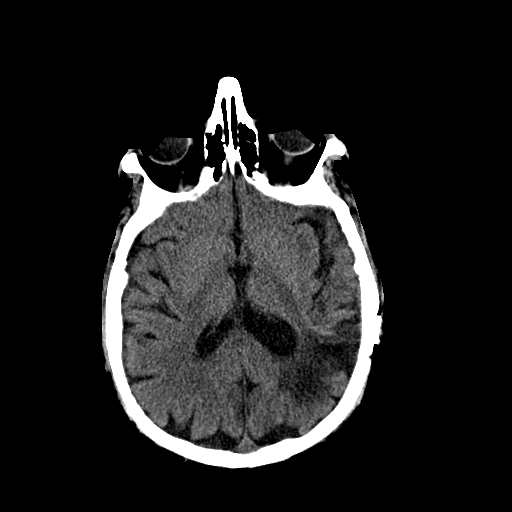
[im 96/150  brain]
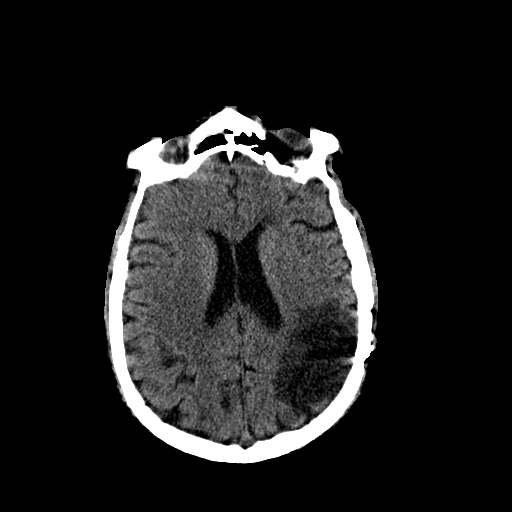
[im 96/150  bone]
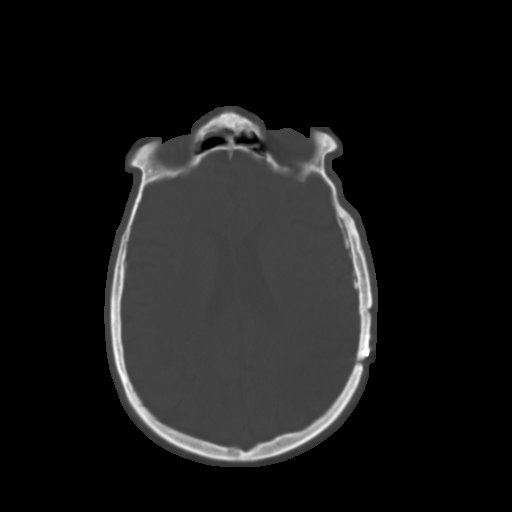
[im 107/150  brain]
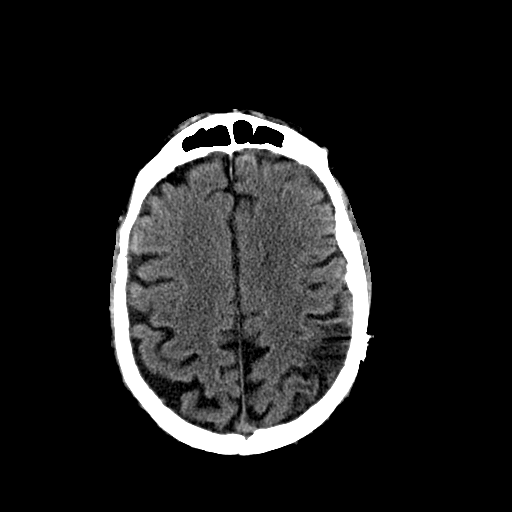
[im 118/150  brain]
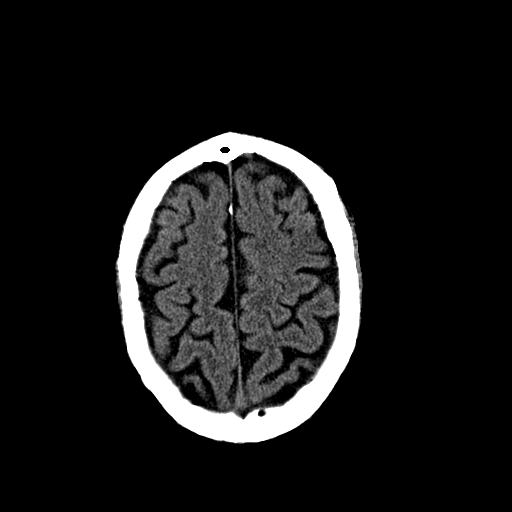
[im 128/150  brain]
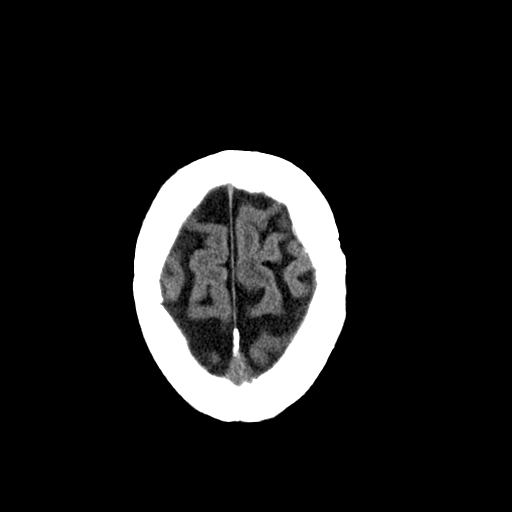
[im 139/150  brain]
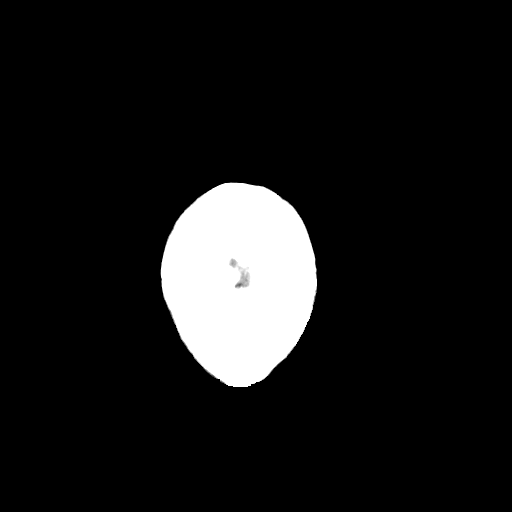
[im 139/150  bone]
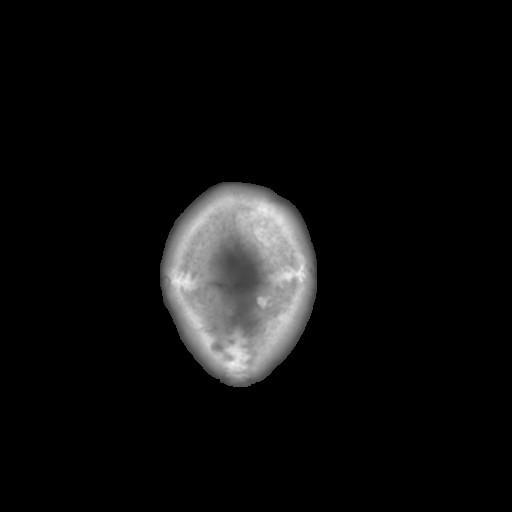

[13 of 30 positions shown; findings below may reference images not displayed]

FINDINGS: Sequelae of posterior left craniotomy is stable. No acute or
suspicious osseous lesion identified. Chronic dural calcifications.
Visualized paranasal sinuses and mastoids are clear. No acute orbit
or scalp soft tissue findings.

Stable CT appearance of the left posterior hemisphere resection
cavity with no regional mass effect. Ex vacuo enlargement of the
left atrium. Following contrast, no abnormal enhancement identified.

Stable and normal gray-white matter differentiation elsewhere. No
intracranial mass effect. No new intracranial enhancement. No acute
intracranial hemorrhage identified. No cortically based acute
infarct identified. Major intracranial vascular structures are
enhancing. Calcified atherosclerosis at the skull base.
IMPRESSION: 1. Stable and satisfactory post treatment CT appearance of the
brain.
2.  No acute or metastatic intracranial abnormality.

## 2016-08-07 IMAGING — DX DG CHEST 1V
1 series · 2 of 2 positions shown · non-contrast
Comparison: 05/25/2015; 05/21/2015; chest CT - 05/21/2015

CLINICAL DATA: Post right-sided thoracentesis.

EXAM:
CHEST 1 VIEW

[Series 2: chest ap · 0.14mm/px · 2 of 2 slices shown]
[im 1/2]
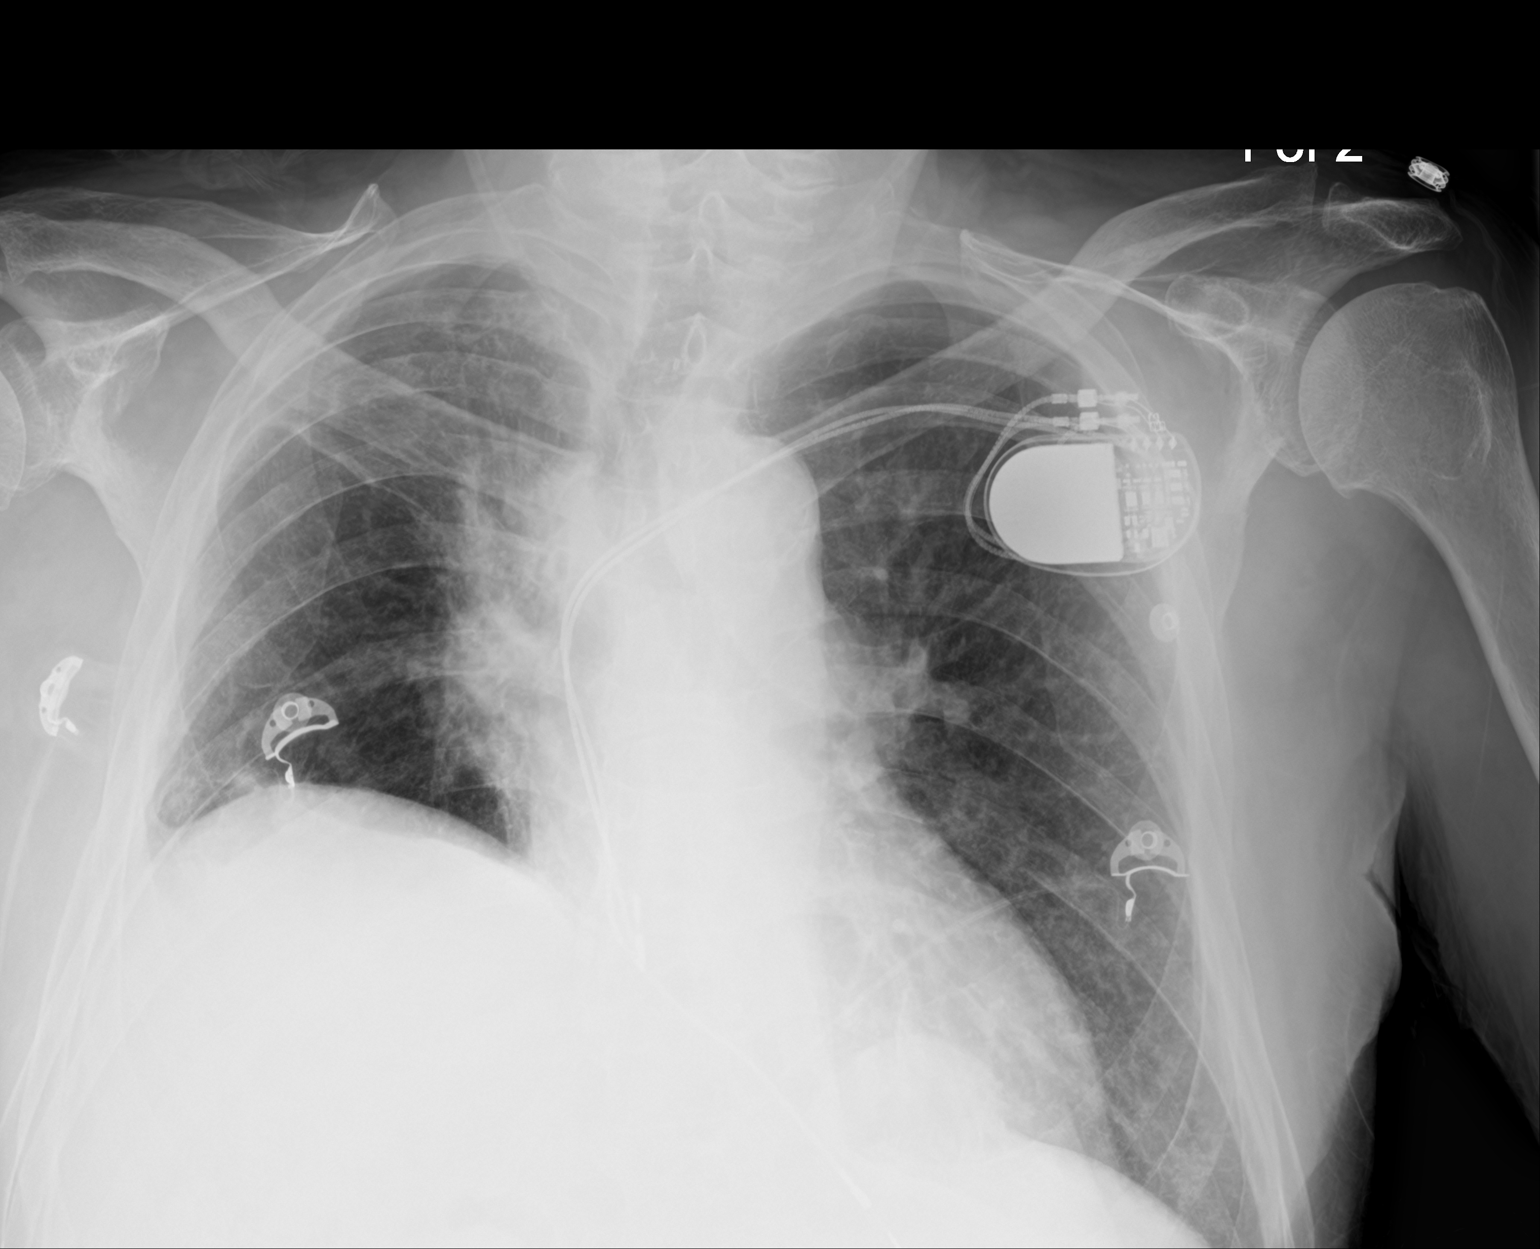
[im 2/2]
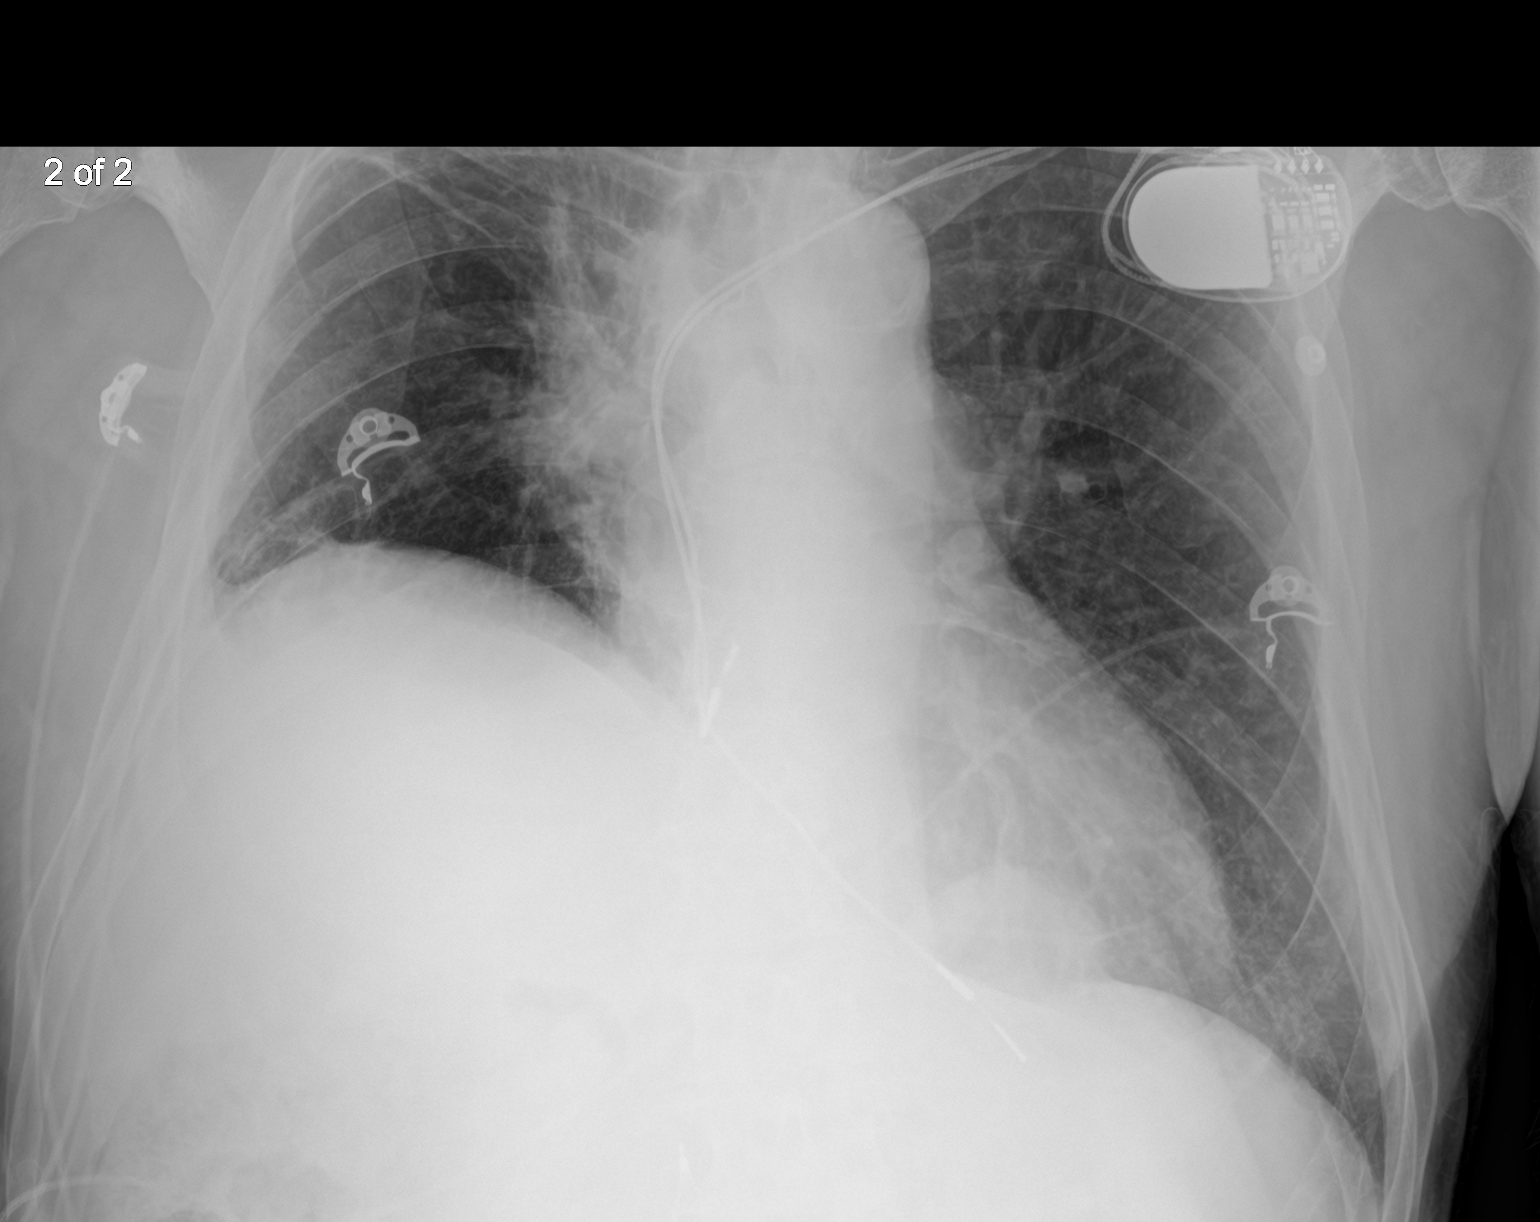

[2 of 2 positions shown; findings below may reference images not displayed]

FINDINGS: Interval reduction / near resolution of right-sided pleural effusion
post thoracentesis. No pneumothorax.

Grossly unchanged cardiac silhouette and mediastinal contours. Right
perihilar heterogeneous opacities are unchanged with persistent
moderate elevation of the right hemidiaphragm. Well-defined
retrocardiac opacity compatible with known Bochdalek hernia. No
evidence of edema. Stable position of support apparatus. No acute
osseus abnormalities.
IMPRESSION: 1. Interval reduction / near resolution of right-sided effusion post
thoracentesis. No pneumothorax.
2. Stable sequela of perihilar radiation change in associated volume
loss.
3. Unchanged well-defined retrocardiac opacity compatible with known
Bochdalek hernia.
